# Patient Record
Sex: Male | Born: 1940 | Race: White | Hispanic: No | Marital: Married | Smoking: Former smoker
Health system: Southern US, Community
[De-identification: ages and names within clinical notes are randomized; demographics above are authoritative.]

## PROBLEM LIST (undated history)

## (undated) DIAGNOSIS — R011 Cardiac murmur, unspecified: Secondary | ICD-10-CM

## (undated) DIAGNOSIS — E119 Type 2 diabetes mellitus without complications: Secondary | ICD-10-CM

## (undated) DIAGNOSIS — C61 Malignant neoplasm of prostate: Secondary | ICD-10-CM

## (undated) DIAGNOSIS — K5790 Diverticulosis of intestine, part unspecified, without perforation or abscess without bleeding: Secondary | ICD-10-CM

## (undated) DIAGNOSIS — G4733 Obstructive sleep apnea (adult) (pediatric): Secondary | ICD-10-CM

## (undated) DIAGNOSIS — I480 Paroxysmal atrial fibrillation: Secondary | ICD-10-CM

## (undated) DIAGNOSIS — Z951 Presence of aortocoronary bypass graft: Secondary | ICD-10-CM

## (undated) DIAGNOSIS — I10 Essential (primary) hypertension: Secondary | ICD-10-CM

## (undated) DIAGNOSIS — H3320 Serous retinal detachment, unspecified eye: Secondary | ICD-10-CM

## (undated) DIAGNOSIS — I5032 Chronic diastolic (congestive) heart failure: Secondary | ICD-10-CM

## (undated) DIAGNOSIS — Z953 Presence of xenogenic heart valve: Secondary | ICD-10-CM

## (undated) DIAGNOSIS — I350 Nonrheumatic aortic (valve) stenosis: Secondary | ICD-10-CM

## (undated) DIAGNOSIS — H35039 Hypertensive retinopathy, unspecified eye: Secondary | ICD-10-CM

## (undated) DIAGNOSIS — H33009 Unspecified retinal detachment with retinal break, unspecified eye: Secondary | ICD-10-CM

## (undated) DIAGNOSIS — D509 Iron deficiency anemia, unspecified: Secondary | ICD-10-CM

## (undated) DIAGNOSIS — M1990 Unspecified osteoarthritis, unspecified site: Secondary | ICD-10-CM

## (undated) DIAGNOSIS — I219 Acute myocardial infarction, unspecified: Secondary | ICD-10-CM

## (undated) DIAGNOSIS — I6522 Occlusion and stenosis of left carotid artery: Secondary | ICD-10-CM

## (undated) DIAGNOSIS — Z9989 Dependence on other enabling machines and devices: Secondary | ICD-10-CM

## (undated) DIAGNOSIS — G2581 Restless legs syndrome: Secondary | ICD-10-CM

## (undated) DIAGNOSIS — M6530 Trigger finger, unspecified finger: Secondary | ICD-10-CM

## (undated) DIAGNOSIS — I5041 Acute combined systolic (congestive) and diastolic (congestive) heart failure: Secondary | ICD-10-CM

## (undated) DIAGNOSIS — Z8679 Personal history of other diseases of the circulatory system: Secondary | ICD-10-CM

## (undated) DIAGNOSIS — F0390 Unspecified dementia without behavioral disturbance: Secondary | ICD-10-CM

## (undated) DIAGNOSIS — K219 Gastro-esophageal reflux disease without esophagitis: Secondary | ICD-10-CM

## (undated) DIAGNOSIS — Z9889 Other specified postprocedural states: Secondary | ICD-10-CM

## (undated) DIAGNOSIS — E1110 Type 2 diabetes mellitus with ketoacidosis without coma: Secondary | ICD-10-CM

## (undated) DIAGNOSIS — I2109 ST elevation (STEMI) myocardial infarction involving other coronary artery of anterior wall: Secondary | ICD-10-CM

## (undated) DIAGNOSIS — E782 Mixed hyperlipidemia: Secondary | ICD-10-CM

## (undated) DIAGNOSIS — T300 Burn of unspecified body region, unspecified degree: Secondary | ICD-10-CM

## (undated) DIAGNOSIS — Z9289 Personal history of other medical treatment: Secondary | ICD-10-CM

## (undated) DIAGNOSIS — N529 Male erectile dysfunction, unspecified: Secondary | ICD-10-CM

## (undated) DIAGNOSIS — Z972 Presence of dental prosthetic device (complete) (partial): Secondary | ICD-10-CM

## (undated) DIAGNOSIS — I440 Atrioventricular block, first degree: Secondary | ICD-10-CM

## (undated) DIAGNOSIS — M419 Scoliosis, unspecified: Secondary | ICD-10-CM

## (undated) DIAGNOSIS — J209 Acute bronchitis, unspecified: Secondary | ICD-10-CM

## (undated) HISTORY — DX: Burn of unspecified body region, unspecified degree: T30.0

## (undated) HISTORY — DX: Dependence on other enabling machines and devices: Z99.89

## (undated) HISTORY — DX: Acute bronchitis, unspecified: J20.9

## (undated) HISTORY — DX: Chronic diastolic (congestive) heart failure: I50.32

## (undated) HISTORY — PX: CATARACT EXTRACTION: SUR2

## (undated) HISTORY — DX: Unspecified retinal detachment with retinal break, unspecified eye: H33.009

## (undated) HISTORY — PX: OTHER SURGICAL HISTORY: SHX169

## (undated) HISTORY — DX: Hypertensive retinopathy, unspecified eye: H35.039

## (undated) HISTORY — DX: Obstructive sleep apnea (adult) (pediatric): G47.33

## (undated) HISTORY — DX: Diverticulosis of intestine, part unspecified, without perforation or abscess without bleeding: K57.90

## (undated) HISTORY — DX: ST elevation (STEMI) myocardial infarction involving other coronary artery of anterior wall: I21.09

## (undated) HISTORY — PX: EYE SURGERY: SHX253

## (undated) HISTORY — DX: Acute combined systolic (congestive) and diastolic (congestive) heart failure: I50.41

## (undated) HISTORY — DX: Type 2 diabetes mellitus with ketoacidosis without coma: E11.10

## (undated) HISTORY — DX: Mixed hyperlipidemia: E78.2

## (undated) HISTORY — DX: Serous retinal detachment, unspecified eye: H33.20

## (undated) HISTORY — PX: TRANSTHORACIC ECHOCARDIOGRAM: SHX275

## (undated) HISTORY — PX: SKIN GRAFT: SHX250

## (undated) HISTORY — PX: PROSTATE BIOPSY: SHX241

## (undated) HISTORY — PX: CATARACT EXTRACTION W/ INTRAOCULAR LENS  IMPLANT, BILATERAL: SHX1307

---

## 1986-08-01 HISTORY — PX: APPENDECTOMY: SHX54

## 1999-08-23 ENCOUNTER — Encounter: Admission: RE | Admit: 1999-08-23 | Discharge: 1999-11-21 | Payer: Self-pay | Admitting: Endocrinology

## 2001-05-05 ENCOUNTER — Inpatient Hospital Stay (HOSPITAL_COMMUNITY): Admission: EM | Admit: 2001-05-05 | Discharge: 2001-05-07 | Payer: Self-pay | Admitting: Emergency Medicine

## 2001-05-05 ENCOUNTER — Encounter: Payer: Self-pay | Admitting: Emergency Medicine

## 2001-05-05 HISTORY — PX: OTHER SURGICAL HISTORY: SHX169

## 2001-05-06 ENCOUNTER — Encounter: Payer: Self-pay | Admitting: Orthopedic Surgery

## 2002-09-20 ENCOUNTER — Encounter: Admission: RE | Admit: 2002-09-20 | Discharge: 2002-09-20 | Payer: Self-pay | Admitting: Family Medicine

## 2002-09-20 ENCOUNTER — Encounter: Payer: Self-pay | Admitting: Family Medicine

## 2003-07-03 ENCOUNTER — Encounter: Admission: RE | Admit: 2003-07-03 | Discharge: 2003-07-03 | Payer: Self-pay | Admitting: Internal Medicine

## 2003-07-04 ENCOUNTER — Encounter: Admission: RE | Admit: 2003-07-04 | Discharge: 2003-07-04 | Payer: Self-pay | Admitting: Internal Medicine

## 2004-06-11 ENCOUNTER — Ambulatory Visit: Payer: Self-pay | Admitting: Internal Medicine

## 2005-02-08 ENCOUNTER — Ambulatory Visit (HOSPITAL_BASED_OUTPATIENT_CLINIC_OR_DEPARTMENT_OTHER): Admission: RE | Admit: 2005-02-08 | Discharge: 2005-02-08 | Payer: Self-pay | Admitting: Otolaryngology

## 2005-02-13 ENCOUNTER — Ambulatory Visit: Payer: Self-pay | Admitting: Internal Medicine

## 2005-09-26 ENCOUNTER — Ambulatory Visit: Payer: Self-pay | Admitting: Internal Medicine

## 2005-11-30 ENCOUNTER — Ambulatory Visit: Payer: Self-pay | Admitting: Internal Medicine

## 2006-06-07 ENCOUNTER — Ambulatory Visit: Payer: Self-pay | Admitting: Internal Medicine

## 2006-06-07 LAB — CONVERTED CEMR LAB: PSA: 2.61 ng/mL (ref 0.10–4.00)

## 2006-07-10 ENCOUNTER — Ambulatory Visit: Payer: Self-pay | Admitting: Family Medicine

## 2006-07-13 ENCOUNTER — Encounter: Admission: RE | Admit: 2006-07-13 | Discharge: 2006-07-13 | Payer: Self-pay | Admitting: Family Medicine

## 2006-08-31 DIAGNOSIS — R252 Cramp and spasm: Secondary | ICD-10-CM | POA: Insufficient documentation

## 2006-08-31 DIAGNOSIS — K5730 Diverticulosis of large intestine without perforation or abscess without bleeding: Secondary | ICD-10-CM | POA: Insufficient documentation

## 2006-08-31 DIAGNOSIS — I10 Essential (primary) hypertension: Secondary | ICD-10-CM | POA: Insufficient documentation

## 2006-08-31 DIAGNOSIS — E119 Type 2 diabetes mellitus without complications: Secondary | ICD-10-CM | POA: Insufficient documentation

## 2008-06-10 ENCOUNTER — Ambulatory Visit: Payer: Self-pay | Admitting: Internal Medicine

## 2008-06-10 DIAGNOSIS — M79609 Pain in unspecified limb: Secondary | ICD-10-CM | POA: Insufficient documentation

## 2008-06-10 DIAGNOSIS — J019 Acute sinusitis, unspecified: Secondary | ICD-10-CM | POA: Insufficient documentation

## 2008-06-18 ENCOUNTER — Encounter: Payer: Self-pay | Admitting: Internal Medicine

## 2008-06-24 ENCOUNTER — Ambulatory Visit: Payer: Self-pay | Admitting: Internal Medicine

## 2008-08-05 ENCOUNTER — Ambulatory Visit: Payer: Self-pay | Admitting: Internal Medicine

## 2008-08-05 DIAGNOSIS — J309 Allergic rhinitis, unspecified: Secondary | ICD-10-CM | POA: Insufficient documentation

## 2008-08-05 DIAGNOSIS — G4730 Sleep apnea, unspecified: Secondary | ICD-10-CM | POA: Insufficient documentation

## 2008-08-07 ENCOUNTER — Ambulatory Visit: Payer: Self-pay | Admitting: Cardiovascular Disease

## 2008-08-08 ENCOUNTER — Encounter (INDEPENDENT_AMBULATORY_CARE_PROVIDER_SITE_OTHER): Payer: Self-pay | Admitting: *Deleted

## 2008-08-08 ENCOUNTER — Telehealth: Payer: Self-pay | Admitting: Internal Medicine

## 2008-08-08 DIAGNOSIS — J328 Other chronic sinusitis: Secondary | ICD-10-CM | POA: Insufficient documentation

## 2008-08-08 LAB — CONVERTED CEMR LAB
Basophils Absolute: 0 10*3/uL (ref 0.0–0.1)
Basophils Relative: 0.8 % (ref 0.0–3.0)
Eosinophils Absolute: 0.4 10*3/uL (ref 0.0–0.7)
Eosinophils Relative: 6.8 % — ABNORMAL HIGH (ref 0.0–5.0)
HCT: 39.6 % (ref 39.0–52.0)
Hemoglobin: 13.8 g/dL (ref 13.0–17.0)
Lymphocytes Relative: 33.1 % (ref 12.0–46.0)
MCHC: 34.9 g/dL (ref 30.0–36.0)
MCV: 88.8 fL (ref 78.0–100.0)
Monocytes Absolute: 0.6 10*3/uL (ref 0.1–1.0)
Monocytes Relative: 10.2 % (ref 3.0–12.0)
Neutro Abs: 2.8 10*3/uL (ref 1.4–7.7)
Neutrophils Relative %: 49.1 % (ref 43.0–77.0)
Platelets: 233 10*3/uL (ref 150–400)
RBC: 4.46 M/uL (ref 4.22–5.81)
RDW: 12.7 % (ref 11.5–14.6)
WBC: 5.7 10*3/uL (ref 4.5–10.5)

## 2008-08-21 ENCOUNTER — Encounter: Payer: Self-pay | Admitting: Internal Medicine

## 2008-09-26 ENCOUNTER — Encounter: Payer: Self-pay | Admitting: Internal Medicine

## 2008-10-29 ENCOUNTER — Encounter: Payer: Self-pay | Admitting: Internal Medicine

## 2008-11-12 ENCOUNTER — Encounter: Payer: Self-pay | Admitting: Internal Medicine

## 2008-12-23 ENCOUNTER — Encounter: Payer: Self-pay | Admitting: Internal Medicine

## 2009-01-06 ENCOUNTER — Ambulatory Visit: Payer: Self-pay | Admitting: Internal Medicine

## 2009-01-07 ENCOUNTER — Telehealth (INDEPENDENT_AMBULATORY_CARE_PROVIDER_SITE_OTHER): Payer: Self-pay | Admitting: *Deleted

## 2009-01-07 ENCOUNTER — Encounter (INDEPENDENT_AMBULATORY_CARE_PROVIDER_SITE_OTHER): Payer: Self-pay | Admitting: *Deleted

## 2009-01-13 ENCOUNTER — Telehealth (INDEPENDENT_AMBULATORY_CARE_PROVIDER_SITE_OTHER): Payer: Self-pay | Admitting: *Deleted

## 2009-01-14 ENCOUNTER — Encounter (INDEPENDENT_AMBULATORY_CARE_PROVIDER_SITE_OTHER): Payer: Self-pay | Admitting: Otolaryngology

## 2009-01-14 ENCOUNTER — Ambulatory Visit (HOSPITAL_COMMUNITY): Admission: RE | Admit: 2009-01-14 | Discharge: 2009-01-15 | Payer: Self-pay | Admitting: Otolaryngology

## 2009-01-26 ENCOUNTER — Encounter: Payer: Self-pay | Admitting: Internal Medicine

## 2009-02-27 ENCOUNTER — Encounter: Payer: Self-pay | Admitting: Internal Medicine

## 2009-03-30 ENCOUNTER — Encounter: Payer: Self-pay | Admitting: Internal Medicine

## 2009-05-13 ENCOUNTER — Encounter: Payer: Self-pay | Admitting: Internal Medicine

## 2009-06-16 ENCOUNTER — Encounter: Payer: Self-pay | Admitting: Internal Medicine

## 2009-06-22 ENCOUNTER — Encounter: Payer: Self-pay | Admitting: Internal Medicine

## 2009-07-21 ENCOUNTER — Encounter: Payer: Self-pay | Admitting: Internal Medicine

## 2009-11-13 ENCOUNTER — Emergency Department (HOSPITAL_BASED_OUTPATIENT_CLINIC_OR_DEPARTMENT_OTHER): Admission: EM | Admit: 2009-11-13 | Discharge: 2009-11-13 | Payer: Self-pay | Admitting: Emergency Medicine

## 2010-09-02 NOTE — Consult Note (Signed)
Summary: [facility]  [facility]   Imported By: Lester Cordova 04/16/2009 10:04:13  _____________________________________________________________________  External Attachment:    Type:   Image     Comment:   External Document

## 2010-09-02 NOTE — Consult Note (Signed)
Summary: [facility]   Imported By: Lanelle Bal 07/02/2008 09:21:04  _____________________________________________________________________  External Attachment:    Type:   Image     Comment:   External Document

## 2010-09-02 NOTE — Assessment & Plan Note (Signed)
Summary: acute only for a cold//ph   Vital Signs:  Patient Profile:   70 Years Old Male Weight:      238.2 pounds Temp:     98.2 degrees F oral Pulse rate:   72 / minute BP sitting:   140 / 86  (left arm) Cuff size:   large  Vitals Entered By: Shonna Chock (August 05, 2008 12:18 PM)                 Chief Complaint:  COLD-COUGH and RUNNY NOSE X 3 MONTHS-SEEN X 2 WITH THE SAME CONCERNS.  History of Present Illness: Nasal occlusion despite Neti pot once daily - two times a day . Remains clear for only 5 min post Neti pot.Also on Plain Mucinex. On CPAP nightly.    Current Allergies (reviewed today): No known allergies      Review of Systems  General      Denies chills, fever, and sweats.  ENT      Complains of nasal congestion.      Denies ear discharge, earache, and sinus pressure.      No facial pain , frontal headache ; scant yellow discharge  Resp      Complains of cough.      Denies shortness of breath, sputum productive, and wheezing.  Allergy      Complains of sneezing.      Denies itching eyes and seasonal allergies.   Physical Exam  General:     in no acute distress; alert,appropriate and cooperative throughout examination Eyes:     No corneal or conjunctival inflammation noted. EOMI. Perrla. Vision grossly normal. Ears:     External ear exam shows no significant lesions or deformities.  Otoscopic examination reveals  wax bilat. Hearing is grossly normal bilaterally. Nose:     External nasal examination shows no deformity or inflammation. Nasal mucosa are pink and moist without lesions or exudates. Hyponasal speech Mouth:     Oral mucosa and oropharynx without lesions or exudates.  Teeth in good repair. Lungs:     Normal respiratory effort, chest expands symmetrically. Lungs are clear to auscultation, no crackles or wheezes. Cervical Nodes:     No lymphadenopathy noted Axillary Nodes:     No palpable lymphadenopathy    Impression &  Recommendations:  Problem # 1:  RHINITIS (ICD-477.9)  Orders: Venipuncture ([address]109) TLB-CBC Platelet - w/Differential (85025-CBCD) Radiology Referral (Radiology)  His updated medication list for this problem includes:    Fluticasone Propionate 50 Mcg/act Susp (Fluticasone propionate) .Marland Kitchen... 1 spray two times a day after neti pot  into each nose    Claritin 10 Mg Tabs (Loratadine) .Marland Kitchen... 1 once daily   Problem # 2:  COUGH (ICD-786.2)  Orders: TLB-CBC Platelet - w/Differential (85025-CBCD) Radiology Referral (Radiology)   Problem # 3:  SLEEP APNEA (ICD-780.57)  Orders: Radiology Referral (Radiology)   Complete Medication List: 1)  Hydrochlorothiazide 25 Mg Tabs (Hydrochlorothiazide) .... 1/2 tab qd 2)  Nexium 40 Mg Cpdr (Esomeprazole magnesium) .Marland Kitchen.. 1 by mouth once daily as needed 3)  Requip 1 Mg Tabs (Ropinirole hcl) .Marland Kitchen.. 1 by mouth once daily 4)  Multivitamin  .Marland Kitchen.. 1 by mouth qd 5)  Welchol 625 Mg Tabs (Colesevelam hcl) .... 3 in am 3 in pm 6)  Vytorin 10-80 Mg Tabs (Ezetimibe-simvastatin) .Marland Kitchen.. 1 by mouth qd 7)  Actoplus Met 15-850 Mg Tabs (Pioglitazone hcl-metformin hcl) .Marland Kitchen.. 1 by mouth bid 8)  Lovaza 1 Gm Caps (Omega-3-acid ethyl esters) .... 2in  am 2 in pm 9)  Lantus 100 Unit/ml Soln (Insulin glargine) .... 35 units in am 10)  Lotrel 5-40 Mg Caps (Amlodipine besy-benazepril hcl) .Marland Kitchen.. 1 qd 11)  Nabumetone 750 Mg Tabs (Nabumetone) .Marland Kitchen.. 1 by mouth as needed for back pain 12)  Fluticasone Propionate 50 Mcg/act Susp (Fluticasone propionate) .Marland Kitchen.. 1 spray two times a day after neti pot  into each nose 13)  Claritin 10 Mg Tabs (Loratadine) .Marland Kitchen.. 1 once daily   Patient Instructions: 1)  Singulair 10 mg once daily until cough resolved. Neti pot two times a day followed by nasal spray   Prescriptions: CLARITIN 10 MG TABS (LORATADINE) 1 once daily  #30 x 11   Entered and Authorized by:   Marga Melnick MD   Signed by:   Marga Melnick MD on 08/05/2008   Method used:   Print then  Give to Patient   RxID:   [id number] FLUTICASONE PROPIONATE 50 MCG/ACT SUSP (FLUTICASONE PROPIONATE) 1 spray two times a day after Neti pot  into each nose  #1 x 5   Entered and Authorized by:   Marga Melnick MD   Signed by:   Marga Melnick MD on 08/05/2008   Method used:   Print then Give to Patient   RxID:   [id number]  ]

## 2010-09-02 NOTE — Consult Note (Signed)
Summary: [facility]  [facility]   Imported By: Lanelle Bal 12/31/2008 10:28:15  _____________________________________________________________________  External Attachment:    Type:   Image     Comment:   External Document

## 2010-09-02 NOTE — Progress Notes (Signed)
Summary: hop--ct scan result  Phone Note Call from Patient Call back at Home Phone [phone]   Caller: Patient Summary of Call: Patient is calling about his ct scan result. 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         RxID:   [id number]   Appended Document: hop--ct scan result Already sent all copies and notes. Thanks, Marcelino Duster

## 2010-09-02 NOTE — Consult Note (Signed)
Summary: [facility]  [facility]   Imported By: Lanelle Bal 06/19/2009 10:47:34  _____________________________________________________________________  External Attachment:    Type:   Image     Comment:   External Document

## 2010-09-02 NOTE — Assessment & Plan Note (Signed)
Summary: cold--acute only--tl   Vital Signs:  Patient Profile:   70 Years Old Male Weight:      228 pounds Temp:     97.0 degrees F oral Pulse rate:   70 / minute Resp:     18 per minute BP sitting:   110 / 60  Pt. in pain?   yes    Location:   4th R toe    Intensity:   4 or <    Type:       g  Vitals Entered By: Kandice Hams (June 10, 2008 12:39 PM)                  Chief Complaint:  c/o cough and nasal congestion sinuse.  History of Present Illness: Onset 2 weeks ago as congestion; Rx; saline gavage,Mucinex, Sudafed & antihistamine. Pain in L foot  several years,worse in past few months. Developed "red spot" @ base 3rd & 4th toes  after walking @ beach. Pain worse @ night. Xrays negative of foot 10 years ago for same foot pain. Rx: none. He sees Dr Evlyn Kanner for Diabetes; no Podiatry followup. FBS 130-140; no pc glucoses checked.       Review of Systems  General      Denies chills, fever, and sweats.  Eyes      Denies discharge, eye pain, and red eye.  ENT      Complains of nasal congestion.      Denies earache and sinus pressure.      No facial pain, frontal ha, purulence  Resp      Complains of cough.      Denies shortness of breath, sputum productive, and wheezing.  Derm      Complains of changes in color of skin and lesion(s).      Denies poor wound healing.  Neuro      Denies numbness and tingling.      No burning  Endo      Denies excessive hunger, excessive thirst, and excessive urination.   Physical Exam  General:     in no acute distress; alert,appropriate and cooperative throughout examination Eyes:     No corneal or conjunctival inflammation noted. Perrla.  Ears:     External ear exam shows no significant lesions or deformities.  Otoscopic examination reveals wax bilaterally. Hearing is grossly normal bilaterally. Nose:     External nasal examination shows no deformity or inflammation. R nare boggy Mouth:     Oral mucosa and  oropharynx without lesions or exudates.  Teeth in good repair. pharyngeal erythema. Grey posterior secretions  Lungs:     Normal respiratory effort, chest expands symmetrically. Lungs are clear to auscultation, no crackles or wheezes. Pulses:     R and L dorsalis pedis and posterior tibial pulses are full and equal bilaterally Extremities:     2X2 resolving ecchymosis base R foot. Pain @ 4th toe with foot compression Skin:     see foot Cervical Nodes:     No lymphadenopathy noted Axillary Nodes:     No palpable lymphadenopathy    Impression & Recommendations:  Problem # 1:  SINUSITIS- ACUTE-NOS (ICD-461.9)  His updated medication list for this problem includes:    Amoxicillin-pot Clavulanate 875-125 Mg Tabs (Amoxicillin-pot clavulanate) .Marland Kitchen... 1 q 12 hrs with a meal   Problem # 2:  FOOT PAIN, RIGHT (ICD-729.5) ? neuroma Orders: Podiatry Referral (Podiatry)   Problem # 3:  DIABETES MELLITUS, TYPE II (ICD-250.00)  His  updated medication list for this problem includes:    Actoplus Met 15-850 Mg Tabs (Pioglitazone hcl-metformin hcl) .Marland Kitchen... 1 by mouth bid    Lantus 100 Unit/ml Soln (Insulin glargine) .Marland KitchenMarland KitchenMarland KitchenMarland Kitchen 35 units in am   Complete Medication List: 1)  Hydrochlorothiazide 25 Mg Tabs (Hydrochlorothiazide) .... 1/2 tab qd 2)  Nexium 40 Mg Cpdr (Esomeprazole magnesium) .Marland Kitchen.. 1 by mouth qd 3)  Requip  .Marland Kitchen.. 1 by mouth qd 4)  Multivitamin  .Marland Kitchen.. 1 by mouth qd 5)  Nabumetone 750 Mg Tabs (Nabumetone) .Marland Kitchen.. 1 poo qd 6)  Welchol 625 Mg Tabs (Colesevelam hcl) .... 3 in am 3 in pm 7)  Vytorin 10-80 Mg Tabs (Ezetimibe-simvastatin) .Marland Kitchen.. 1 by mouth qd 8)  Actoplus Met 15-850 Mg Tabs (Pioglitazone hcl-metformin hcl) .Marland Kitchen.. 1 by mouth bid 9)  Lovaza 1 Gm Caps (Omega-3-acid ethyl esters) .... 2in am 2 in pm 10)  Lotrel  .Marland Kitchen.. 1 by mouth once daily 11)  Lantus 100 Unit/ml Soln (Insulin glargine) .... 35 units in am 12)  Amoxicillin-pot Clavulanate 875-125 Mg Tabs (Amoxicillin-pot clavulanate) .Marland Kitchen.. 1  q 12 hrs with a meal   Patient Instructions: 1)  Drink as much fluid as you can tolerate for the next few days. Neti pot once daily for any congestion. Stop Sudafed.   Prescriptions: AMOXICILLIN-POT CLAVULANATE 875-125 MG TABS (AMOXICILLIN-POT CLAVULANATE) 1 q 12 hrs WITH A MEAL  #20 x 0   Entered and Authorized by:   Marga Melnick MD   Signed by:   Marga Melnick MD on 06/10/2008   Method used:   Print then Give to Patient   RxID:   [id number]  ]

## 2010-09-02 NOTE — Letter (Signed)
Summary: [facility]  [facility]   Imported By: Lanelle Bal 07/06/2009 11:00:50  _____________________________________________________________________  External Attachment:    Type:   Image     Comment:   External Document

## 2010-09-02 NOTE — Consult Note (Signed)
Summary: [facility]  [facility]   Imported By: Lanelle Bal 05/20/2009 12:31:03  _____________________________________________________________________  External Attachment:    Type:   Image     Comment:   External Document

## 2010-09-02 NOTE — Progress Notes (Signed)
Summary: cough no better  Phone Note Call from Patient Call back at Home Phone [phone]   Complaint: Chest Pain Summary of Call: pt left message that he was given PROMETHAZINE-CODEINE on 01-06-09 pt still has some of this med left. pt states that he still has a cough and would like to know if he can get something called in for it..pt uses [facility] [address]...............Marland KitchenFelecia Deloach CMA  January 13, 2009 12:45 PM  Follow-up for Phone Call        Tussionex 1 tsp q 8-12 hrs 90cc Follow-up by: Marga Melnick MD,  January 13, 2009 1:46 PM  Additional Follow-up for Phone Call Additional follow up Details #1::        pt aware rx sent to pharmacy...........Marland KitchenFelecia Deloach CMA  January 13, 2009 3:31 PM    New/Updated Medications: Sandria Senter ER 8-10 MG/5ML LQCR (CHLORPHENIRAMINE-HYDROCODONE) Take 1 tsp q 8-12 hrs   Prescriptions: TUSSIONEX PENNKINETIC ER 8-10 MG/5ML LQCR (CHLORPHENIRAMINE-HYDROCODONE) Take 1 tsp q 8-12 hrs  #90 x 0   Entered by:   Jeremy Johann CMA   Authorized by:   Marga Melnick MD   Signed by:   Jeremy Johann CMA on 01/13/2009   Method used:   Printed then faxed to ...       [facility]. [phone]* (retail)       [address]       [place], [place]  [address]       Ph: [phone]       Fax: [phone]   RxID:   [id number]

## 2010-09-02 NOTE — Consult Note (Signed)
Summary: [facility]  [facility]   Imported By: Lanelle Bal 03/05/2009 10:16:59  _____________________________________________________________________  External Attachment:    Type:   Image     Comment:   External Document

## 2010-09-02 NOTE — Consult Note (Signed)
Summary: [facility]  [facility]   Imported By: Lanelle Bal 04/03/2009 11:40:55  _____________________________________________________________________  External Attachment:    Type:   Image     Comment:   External Document

## 2010-09-02 NOTE — Consult Note (Signed)
Summary: [facility]  [facility]   Imported By: Lanelle Bal 10/08/2008 12:38:08  _____________________________________________________________________  External Attachment:    Type:   Image     Comment:   External Document

## 2010-09-02 NOTE — Consult Note (Signed)
Summary: [facility]  [facility]   Imported By: Lanelle Bal 11/18/2008 11:32:58  _____________________________________________________________________  External Attachment:    Type:   Image     Comment:   External Document

## 2010-09-02 NOTE — Assessment & Plan Note (Signed)
Summary: bad cough//vgj   Vital Signs:  Patient Profile:   70 Years Old Male Weight:      230 pounds Temp:     98.3 degrees F oral Pulse rate:   76 / minute Resp:     17 per minute BP sitting:   160 / 90  (left arm) Cuff size:   large  Vitals Entered By: Shonna Chock (June 24, 2008 1:36 PM)                 Chief Complaint:  Cough X 4-5 DAYS and RUNNY NOSE X 1 MONTH.  History of Present Illness: NP cough ; Rx: Delsym of benefit. No extrinsic symptoms; minor PNDrainage.FBS was 136 this am. Not checking BP.    Current Allergies (reviewed today): No known allergies     Risk Factors:  Tobacco use:  never Alcohol use:  yes    Type:  BEER Exercise:  no   Review of Systems  General      Denies chills, fever, and sweats.  ENT      Denies earache and sinus pressure.      No facial pain, frontal headache or purulence  Resp      Complains of cough.      Denies shortness of breath and wheezing.   Physical Exam  General:     in no acute distress; alert,appropriate and cooperative throughout examination Ears:     External ear exam shows no significant lesions or deformities.  Otoscopic examination reveals wax bilat. Hearing is grossly normal bilaterally. Nose:     External nasal examination shows no deformity or inflammation. Nasal mucosa aremildly erythematous  without lesions or exudates. Mouth:     Oral mucosa and oropharynx without lesions or exudates.  Teeth in good repair. Lungs:     Normal respiratory effort, chest expands symmetrically. Lungs are clear to auscultation, no crackles or wheezes. Cervical Nodes:     No lymphadenopathy noted Axillary Nodes:     No palpable lymphadenopathy    Impression & Recommendations:  Problem # 1:  BRONCHITIS-ACUTE (ICD-466.0)  The following medications were removed from the medication list:    Amoxicillin-pot Clavulanate 875-125 Mg Tabs (Amoxicillin-pot clavulanate) .Marland Kitchen... 1 q 12 hrs with a meal  His  updated medication list for this problem includes:    Promethazine-codeine 6.25-10 Mg/41ml Syrp (Promethazine-codeine) .Marland Kitchen... 1 tsp q 6 hrs as needed    Doxycycline Hyclate 100 Mg Caps (Doxycycline hyclate) .Marland Kitchen... 1 two times a day x 2 days then 1 once daily   Complete Medication List: 1)  Hydrochlorothiazide 25 Mg Tabs (Hydrochlorothiazide) .... 1/2 tab qd 2)  Nexium 40 Mg Cpdr (Esomeprazole magnesium) .Marland Kitchen.. 1 by mouth once daily as needed 3)  Requip 1 Mg Tabs (Ropinirole hcl) .Marland Kitchen.. 1 by mouth once daily 4)  Multivitamin  .Marland Kitchen.. 1 by mouth qd 5)  Welchol 625 Mg Tabs (Colesevelam hcl) .... 3 in am 3 in pm 6)  Vytorin 10-80 Mg Tabs (Ezetimibe-simvastatin) .Marland Kitchen.. 1 by mouth qd 7)  Actoplus Met 15-850 Mg Tabs (Pioglitazone hcl-metformin hcl) .Marland Kitchen.. 1 by mouth bid 8)  Lovaza 1 Gm Caps (Omega-3-acid ethyl esters) .... 2in am 2 in pm 9)  Lotrel  .Marland Kitchen.. 1 by mouth once daily 10)  Lantus 100 Unit/ml Soln (Insulin glargine) .... 35 units in am 11)  Promethazine-codeine 6.25-10 Mg/41ml Syrp (Promethazine-codeine) .Marland Kitchen.. 1 tsp q 6 hrs as needed 12)  Doxycycline Hyclate 100 Mg Caps (Doxycycline hyclate) .Marland Kitchen.. 1 two times a day  x 2 days then 1 once daily 13)  Lotrel 5-40 Mg Caps (Amlodipine besy-benazepril hcl) .Marland Kitchen.. 1 qd   Patient Instructions: 1)  Limit your Sodium (Salt) to less than 4 grams a day (slightly less than 1 teaspoon) to prevent fluid retention, swelling, or worsening or symptoms. Avoid decongestants. 2)  Check your Blood Pressure regularly. If it is above: 130/85 ON AVERAGE  you should make an appointment.   Prescriptions: LOTREL 5-40 MG CAPS (AMLODIPINE BESY-BENAZEPRIL HCL) 1 qd  #30 x 5   Entered and Authorized by:   Marga Melnick MD   Signed by:   Marga Melnick MD on 06/24/2008   Method used:   Print then Give to Patient   RxID:   [id number] DOXYCYCLINE HYCLATE 100 MG CAPS (DOXYCYCLINE HYCLATE) 1 two times a day X 2 days then 1 once daily  #12 x 0   Entered and Authorized by:   Marga Melnick MD   Signed by:   Marga Melnick MD on 06/24/2008   Method used:   Print then Give to Patient   RxID:   [id number] PROMETHAZINE-CODEINE 6.25-10 MG/5ML SYRP (PROMETHAZINE-CODEINE) 1 tsp q 6 hrs as needed  #120cc x 0   Entered and Authorized by:   Marga Melnick MD   Signed by:   Marga Melnick MD on 06/24/2008   Method used:   Print then Give to Patient   RxID:   [id number]  ]

## 2010-09-02 NOTE — Consult Note (Signed)
Summary: [facility]  [facility]   Imported By: Lanelle Bal 07/31/2009 10:18:28  _____________________________________________________________________  External Attachment:    Type:   Image     Comment:   External Document

## 2010-09-02 NOTE — Assessment & Plan Note (Signed)
Summary: congested/cough/cbs   Vital Signs:  Patient profile:   70 year old male Weight:      227.38 pounds Temp:     98.2 degrees F oral Resp:     17 per minute BP sitting:   140 / 72  Vitals Entered By: Shonna Chock (January 06, 2009 12:37 PM) CC: C/O NASAL STUFFINESS,COUGH, scheduled for sinus surgery 01/14/09 finished round of avelox 400mg  x 14 days, also prednisone taper dose   CC:  C/O NASAL STUFFINESS, COUGH, scheduled for sinus surgery 01/14/09 finished round of avelox 400mg  x 14 days, and also prednisone taper dose.  History of Present Illness: Ongoing congestion  despite 14 days of Avelox ,completed 01/05/09 (yesterday). Dr Jenne Pane'  12/23/08 note reviewed; endoscopic surgery planned for 6/16.In [place] 5/14-24. Now having chest congestion which is concern in relationship to  pending surgery . No PMH of asthma; PMH of sleep apnea  Allergies: No Known Drug Allergies  Past History:  Past Medical History: Diabetes mellitus, type II Diverticulosis, colon Hypertension Sleep Apnea, Dr Dohmier  Review of Systems General:  Denies chills, fever, and sweats. Eyes:  Denies discharge, eye pain, and red eye. ENT:  Complains of nasal congestion and sinus pressure; No frontal headache or facial pain; purulent secretions. Resp:  Complains of cough and sputum productive; denies chest pain with inspiration, coughing up blood, pleuritic, shortness of breath, and wheezing; Phlegm is slightly yellow. GI:  Complains of diarrhea; Intermittent diarrhea.  Physical Exam  General:  in no acute distress; alert,appropriate and cooperative throughout examination Ears:  Wax bilaterally Nose:  External nasal examination shows no deformity or inflammation. R nare erythematous with crusting Mouth:  Oral mucosa and oropharynx without lesions or exudates. Mild pharyngeal erythema.   Lungs:  Normal respiratory effort, chest expands symmetrically. Lungs : mild  crackles LLL > RLL Cervical Nodes:  No  lymphadenopathy noted Axillary Nodes:  No palpable lymphadenopathy   Impression & Recommendations:  Problem # 1:  BRONCHITIS-ACUTE (ICD-466.0)  The following medications were removed from the medication list:    Metronidazole 500 Mg Tabs (Metronidazole) .Marland Kitchen... 1 tid His updated medication list for this problem includes:    Doxycycline Hyclate 100 Mg Caps (Doxycycline hyclate) .Marland Kitchen... 1 two times a day (avoid direct sun)    Promethazine-codeine 6.25-10 Mg/80ml Syrp (Promethazine-codeine) .Marland Kitchen... 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 1 spray two times a day after neti pot  into each nose    Metronidazole 500 Mg Tabs (Metronidazole) .Marland Kitchen... 1 tid His updated medication list for this problem includes:    Doxycycline Hyclate 100 Mg Caps (Doxycycline hyclate) .Marland Kitchen... 1 two times a day (avoid direct sun)    Promethazine-codeine 6.25-10 Mg/38ml Syrp (Promethazine-codeine) .Marland Kitchen... 1 tsp q 6 hrs as needed  Complete Medication List: 1)  Hydrochlorothiazide 25 Mg Tabs (Hydrochlorothiazide) .... 1/2 tab qd 2)  Nexium 40 Mg Cpdr (Esomeprazole magnesium) .Marland Kitchen.. 1 by mouth once daily as needed 3)  Requip 1 Mg Tabs (Ropinirole hcl) .Marland Kitchen.. 1 by mouth once daily 4)  Multivitamin  .Marland Kitchen.. 1 by mouth qd 5)  Welchol 625 Mg Tabs (Colesevelam hcl) .... 3 in am 3 in pm 6)  Vytorin 10-80 Mg Tabs (Ezetimibe-simvastatin) .Marland Kitchen.. 1 by mouth qd 7)  Actoplus Met 15-850 Mg Tabs (Pioglitazone hcl-metformin hcl) .Marland KitchenMarland KitchenMarland Kitchen  1 by mouth bid 8)  Lovaza 1 Gm Caps (Omega-3-acid ethyl esters) .... 2in am 2 in pm 9)  Lantus 100 Unit/ml Soln (Insulin glargine) .... 35 units in am 10)  Lotrel 5-40 Mg Caps (Amlodipine besy-benazepril hcl) .Marland Kitchen.. 1 qd 11)  Nabumetone 750 Mg Tabs (Nabumetone) .Marland Kitchen.. 1 by mouth as needed for back pain 12)  Doxycycline  Hyclate 100 Mg Caps (Doxycycline hyclate) .Marland Kitchen.. 1 two times a day (avoid direct sun) 13)  Promethazine-codeine 6.25-10 Mg/82ml Syrp (Promethazine-codeine) .Marland Kitchen.. 1 tsp q 6 hrs as needed  Patient Instructions: 1)  Align once daily as needed for any loose or watery stool. Prescriptions: PROMETHAZINE-CODEINE 6.25-10 MG/5ML SYRP (PROMETHAZINE-CODEINE) 1 tsp q 6 hrs as needed  #120cc x 0   Entered and Authorized by:   Marga Melnick MD   Signed by:   Marga Melnick MD on 01/06/2009   Method used:   Print then Give to Patient   RxID:   [id number] DOXYCYCLINE HYCLATE 100 MG CAPS (DOXYCYCLINE HYCLATE) 1 two times a day (avoid direct sun)  #20 x 0   Entered and Authorized by:   Marga Melnick MD   Signed by:   Marga Melnick MD on 01/06/2009   Method used:   Print then Give to Patient   RxID:   [id number]

## 2010-09-02 NOTE — Letter (Signed)
Summary: Results Follow up Letter  [name]  [address] [place], [place] [address]   Phone: [phone]  Fax: [phone]    01/07/2009 MRN: [id number]  [facility] [address] [place], [place]  [address]  Dear [name],  The following are the results of your recent test(s):  Test         Result    Pap Smear:        Normal _____  Not Normal _____ Comments: ______________________________________________________ Cholesterol: LDL(Bad cholesterol):         Your goal is less than:         HDL (Good cholesterol):       Your goal is more than: Comments:  ______________________________________________________ Mammogram:        Normal _____  Not Normal _____ Comments:  ___________________________________________________________________ Hemoccult:        Normal _____  Not normal _______ Comments:    _____________________________________________________________________ Other Tests: Please see attached Chest X-Ray done on 01/06/2009   We routinely do not discuss normal results over the telephone.  If you desire a copy of the results, or you have any questions about this information we can discuss them at your next office visit.   Sincerely,

## 2010-11-08 LAB — CBC
HCT: 40.4 % (ref 39.0–52.0)
Hemoglobin: 13.7 g/dL (ref 13.0–17.0)
MCHC: 33.9 g/dL (ref 30.0–36.0)
MCV: 89.7 fL (ref 78.0–100.0)
Platelets: 190 10*3/uL (ref 150–400)
RBC: 4.51 MIL/uL (ref 4.22–5.81)
RDW: 13.2 % (ref 11.5–15.5)
WBC: 6.6 10*3/uL (ref 4.0–10.5)

## 2010-11-08 LAB — GLUCOSE, CAPILLARY
Glucose-Capillary: 185 mg/dL — ABNORMAL HIGH (ref 70–99)
Glucose-Capillary: 208 mg/dL — ABNORMAL HIGH (ref 70–99)
Glucose-Capillary: 226 mg/dL — ABNORMAL HIGH (ref 70–99)
Glucose-Capillary: 229 mg/dL — ABNORMAL HIGH (ref 70–99)
Glucose-Capillary: 235 mg/dL — ABNORMAL HIGH (ref 70–99)
Glucose-Capillary: 268 mg/dL — ABNORMAL HIGH (ref 70–99)

## 2010-11-08 LAB — COMPREHENSIVE METABOLIC PANEL
ALT: 28 U/L (ref 0–53)
AST: 20 U/L (ref 0–37)
Albumin: 3.3 g/dL — ABNORMAL LOW (ref 3.5–5.2)
Alkaline Phosphatase: 40 U/L (ref 39–117)
BUN: 11 mg/dL (ref 6–23)
CO2: 26 mEq/L (ref 19–32)
Calcium: 9.3 mg/dL (ref 8.4–10.5)
Chloride: 105 mEq/L (ref 96–112)
Creatinine, Ser: 0.87 mg/dL (ref 0.4–1.5)
GFR calc Af Amer: >60 mL/min (ref >60)
GFR calc non Af Amer: >60 mL/min (ref >60)
Glucose, Bld: 180 mg/dL — ABNORMAL HIGH (ref 70–99)
Potassium: 4.2 mEq/L (ref 3.5–5.1)
Sodium: 138 mEq/L (ref 135–145)
Total Bilirubin: 0.6 mg/dL (ref 0.3–1.2)
Total Protein: 5.8 g/dL — ABNORMAL LOW (ref 6.0–8.3)

## 2010-12-14 NOTE — Op Note (Signed)
NAMECORDARRYL, MONRREAL NO.:  [id number]   MEDICAL RECORD NO.:  [id number]          PATIENT TYPE:  OIB   LOCATION:  5151                         FACILITY:  [facility]   PHYSICIAN:  Antony Contras, MD     DATE OF BIRTH:  [birth year]   DATE OF PROCEDURE:  01/14/2009  DATE OF DISCHARGE:                               OPERATIVE REPORT   PREOPERATIVE DIAGNOSES:  1. Chronic polypoid pansinusitis.  2. Inferior turbinate hypertrophy.   POSTOPERATIVE DIAGNOSES:  1. Chronic polypoid pansinusitis.  2. Inferior turbinate hypertrophy.   PROCEDURE:  1. Bilateral total ethmoidectomy.  2. Bilateral frontal recess exploration.  3. Bilateral maxillary antrostomy with tissue removal.  4. Bilateral sphenoidotomy.  5. Stealth image guidance.  6. Bilateral inferior turbinate reduction.   SURGEON:  Antony Contras, MD   ANESTHESIA:  General endotracheal anesthesia.   COMPLICATIONS:  None.   INDICATIONS:  The patient is a 70 year old white male who has a several  month history of nasal obstruction and postnasal drainage and thick  nasal discharge that has been treated multiple times with antibiotics  and steroids with some benefit but only for temporary time frame.  Imaging demonstrates polypoid changes within the ethmoid sinuses and air-  fluid levels and swelling involving all of the sinuses otherwise.  In  addition, he was found to have enlarged turbinates and presents to the  operating room for surgical management.   FINDINGS:  1. The turbinates were found to be enlarged.  2. There was polypoid tissue filling the middle meatus and superior      meatus on both sides.  Polypoid tissue extended into the ethmoid      sinuses and obstructed the frontal recesses and sphenoid openings,      as well as maxillary sinus openings.  The right maxillary sinus had      polypoid tissue just about filling the sinus and there is mild      polypoid tissue with pus in the left maxillary sinus.   The left      middle turbinate was largely destroyed by polypoid tissue and was      trimmed back during the procedure.  Toward the end of the      procedure, a leak of clear fluid was seen at the anterior skull      base in the right ethmoid region and was treated with DuraSeal      without any additional leak.   DESCRIPTION OF PROCEDURE:  The patient was identified in the holding  room and informed consent having been obtained including discussion of  risks, benefits, and alternatives, the patient was brought to the  operative suite and put on the operating table in supine position.  Anesthesia was induced and the patient was intubated by the Anesthesia  team without difficulty.  The patient was given intravenous antibiotics  and steroids during the case.  The eyes were lubricated and the bed was  turned 90 degrees from Anesthesia.  The stealth head frame system was  placed in the standard fashion and the patient was  then registered to  the system.  The face was prepped and draped in sterile fashion.  Afrin  pledgets were added to the nasal passages on both sides.  The eyes were  taped closed with Steri-Strips.  The instruments were then calibrated to  the system.  The right side pledgets were removed and lateral nasal wall  was injected with 1% lidocaine with 1:100,000 epinephrine in standard  fashion.  Polypoid tissue was then biopsied using forceps and removed  using the microdebrider, exposing the middle turbinate and uncinate  process.  The uncinate process was then divided using a backbiter and  removed with the microdebrider.  The maxillary opening was then seen  with an angled telescope and was widened using curve suction and  microdebrider.  A curved microdebrider was then inserted into the  maxillary sinus and used to remove polypoid tissue.  The anterior tissue  was removed by passing the microdebrider through the inferior meatus  into the sinus.  After this, a 0-degree  telescope was again used to  evaluate the ethmoid and the bulla was taken down with the  microdebrider, followed by anterior ethmoid cells.  Dissection continued  following polypoid tissue into the posterior ethmoid and septations and  polypoid tissue were then removed back to the skull base posteriorly  within the ethmoid region.  Further dissection removed tissue from the  sphenoid rostrum exposing it.  The middle turbinate was then lateralized  and polypoid tissue then removed from the superior meatus.  The superior  turbinate was then partially trimmed with the microdebrider exposing the  sphenoid opening which was then widened using microdebrider with  polypoid tissue removed from the opening.  The opening was widened  laterally and inferiorly primarily.  After this, the middle turbinate  was medialized again and the skull base was then examined in retrograde  fashion removing polypoid tissue and septations from the skull base to  the frontal recess.  The frontal recess was easily opened using  microdebrider with wide patency of the frontal recess.  Thick mucus was  suctioned from the frontal sinus opening.  At this point, Afrin pledgets  were added to the right ethmoid cavity and the left side was examined.  The same procedures were carried on the left side including biopsy of  the polyp, removal of polyp tissue to the middle meatus, widening of the  maxillary sinus with removal of tissue from the sinus using curved  microdebrider, removal of septations and polypoid tissue through the  ethmoid sinuses back to the skull base at the sphenoid rostrum, removal  of polypoid tissue from the superior meatus and exposure of the sphenoid  opening, widening of the sphenoid opening using microdebrider in a  lateral and inferior direction, and retrograde removal of the ethmoid  septations and polypoid tissue from the anterior ethmoid and frontal  recess.  Once again, on the left side, the  frontal recess was widely  patent after removing the anterior ethmoid tissues.  Afrin pledgets were  added to the left side.  At this point, the inferior turbinates were  injected with 1% lidocaine with 1:100,000 epinephrine on both sides.  A  stab incision was made at the anterior head of the turbinate on both  sides using a 15-blade scalpel and soft tissues were elevated off the  underlying bone using Therapist, nutritional.  The turbinate blade of the  microdebrider was then used to remove submucosal tissues on both sides  and the bones were  then lateralized on both sides.  The nose was then  further suctioned and reexamined with the telescope.  The right side was  suctioned of blood and debris and there was little bit of the loose bone  fragments in the anterior ethmoid skull base.  Upon gentle manipulation  of this area, clear fluid was then seen to drain from the soft tissues  of this part of the skull base.  Thus, a pledget was placed over the  area and the patient was then hyperventilated down to a CO2 of about 20.  DuraSeal was then assembled and mixed and then the pledget was removed.  The DuraSeal was then added to the anterior skull base region where the  leak was occurring and no further leak was seen.  The nose was then  further suctioned and half of a nasal port pack was then placed in each  side and the ethmoid cavity coated in Bactroban ointment.  The nose and  throat were then further  suctioned.  The patient was then returned to Anesthesia for wake up.  The antenna device was removed and the eyes were untaped and found to be  in good condition.  The patient was then extubated and taken to recovery  room in stable condition.      Antony Contras, MD  Electronically Signed     Antony Contras, MD  Electronically Signed    DDB/MEDQ  D:  01/14/2009  T:  01/15/2009  Job:  [id number]

## 2010-12-17 NOTE — Op Note (Signed)
[facility]  Patient:    Cody Phelps, Cody Phelps Visit Number: [id number] MRN: [id number]          Service Type: SUR Location: 5000 [id number] Attending Physician:  Alinda Deem Dictated by:   Alinda Deem, M.D. Admit Date:  05/05/2001                             Operative Report  PREOPERATIVE DIAGNOSIS:  Right ankle bimalleolar fracture with a pilon component.  POSTOPERATIVE DIAGNOSIS:  Right ankle pilon fracture.  PROCEDURE:  Open reduction and internal fixation of right ankle bimalleolar or pilon fracture.  SURGEON:  Alinda Deem, M.D.  FIRST ASSISTANT:  Dorthula Matas, P.A.-C.  ANESTHESIA:  General endotracheal.  ESTIMATED BLOOD LOSS:  Minimal.  FLUID REPLACEMENT:  1 L crystalloid.  DRAINS:  None.  TOURNIQUET TIME:  One hour.  INDICATION FOR PROCEDURE:  A 70 year old man who has oral-controlled diabetes and hypertension, who stepped in a chuck hole and sustained a comminuted bimalleolar ankle fracture that went into the superior weightbearing portion of the tibia and therefore was a pilon fracture at surgery.  This was actually a fracture-dislocation, and the dislocation was reduced in the emergency room to preserve skin function, and this was accomplished within five or 10 minutes when I met him in the ER.  In any event, he was taken for open reduction and internal fixation of the unstable ankle fracture.  DESCRIPTION OF PROCEDURE:  The patient identified by arm band and taken to the operating room at [facility] main hospital.  Appropriate anesthetic monitors were attached.  General endotracheal anesthesia induced, and a right calf tourniquet applied.  The right lower extremity was then prepped and draped in the usual sterile fashion from the toes to the tourniquet.  We began the procedure by making a fairly long medial incision that started out 2 cm below the tip of the medial malleolus and went proximally for about 6-7  cm.  We cut through the skin and then used tenotomy scissors to dissect the subcutaneous tissue, where we found the medial malleolus and the superior aspect of the tibial plafond fractured off with two major components, with some minor components as well.  The superior plafond, which wrapped around medially above the medial malleolus, was reduced and fixed with two mini-fragment screws. This allowed [place] to see the medial malleolus into the tibia and the plafond fragment that had previously been fixed and fixed it with two 4 mm fully-threaded cancellous lag screws.  Once we accomplished this, the medial side was reconstructed and the ankle was relatively stable since the deltoid ligament was intact.  We then directed our attention laterally, where we made an incision in the tip of the lateral malleolus proximally over a distance of about 15 cm because of the long, spiral comminuted butterfly fracture of the fibula.  Again using tenotomy scissors, we dissected through the soft tissue down to the fibula and using a periosteal elevator, stripped the lateral surface of the fibula to allow reduction of the fragments and fixation of the butterfly using two anterior to posterior 3.5 mm cortical lag screws from the AO small fragment set and then a third anterior to posterior lag screw to fix the butterfly to the distal fragment.  We then selected a 10-hole neutralization plate, a [id number]-[id number], one-third tubular plate, for neutralization, and distally used one bicortical screw, one syndesmosis screw, and two cancellous  screws, and proximally four bicortical screws.  The syndesmosis screw was actually a 4.5 mm screw from the AO standard fragment set to further support the mortise joint.  Anatomic reduction was confirmed by radiographs, and the wounds were then washed out with normal saline solution.  The tourniquet, which had been inflated to 300 mmHg at the beginning of the case, was let down at this  point.  Small bleeders were identified and cauterized. The subcutaneous tissue was closed with running 3-0 Vicryl suture and the skin with running interlocking 3-0 nylon suture.  We then applied a dressing of Xeroform, 4 x 4 dressing sponges, Webril, and an Ace wrap.  A Cam walker boot was applied, and the patient was awakened and taken to the recovery room without difficulty. Dictated by:   Alinda Deem, M.D. Attending Physician:  Alinda Deem DD:  05/06/01 TD:  05/07/01 Job: [id number]

## 2010-12-17 NOTE — Procedures (Signed)
NAME:  Cody, Phelps NO.:  [id number]   MEDICAL RECORD NO.:  [id number]          PATIENT TYPE:  OUT   LOCATION:  SLEEP CENTER                 FACILITY:  [facility]   PHYSICIAN:  Clinton D. Maple Hudson, M.D. DATE OF BIRTH:  Apr 05, 1941   DATE OF STUDY:  02/08/2005                              NOCTURNAL POLYSOMNOGRAM   REFERRING PHYSICIAN:  Onalee Hua L. Annalee Genta, M.D.   INDICATION FOR STUDY:  Hypersomnia with sleep apnea. Epworth sleep score:  14/24. BMI: 32. Weight 220 pounds.   SLEEP ARCHITECTURE:  Total sleep time 355 minutes with sleep efficiency 82%.  Stage I was 7%, stage II 86%, stages III and IV were absent. REM 6% of total  sleep time. Sleep latency 13.5 minutes. REM latency 19 minutes. Awake after  sleep onset 68 minutes. Arousal index increased at 66. Bed time medication  not recorded.   RESPIRATORY DATA:  Split study protocol. Respiratory disturbance index  (RDI/AHI) 110.3 obstructive events per hour indicating severe obstructive  sleep apnea/hypopnea syndrome before C-PAP. This included 3 obstructive  sleep apneas and 237 hypopneas before C-PAP. Events were not positional. REM  RDI 0. C-PAP was titrated 12 CWP, RDI 2 per hour. Using a large comfort gel  mask with heated humidifier.   OXYGEN DATA:  Moderate snoring with oxygen desaturation to a nadir of 96% on  review of graph. Mean oxygen saturation on C-PAP was 96% on room air.   CARDIAC DATA:  Normal sinus rhythm.   MOVEMENT/PARASOMNIA:  A total of 415 limb jerks were reported of which 60  were associated with arousal or awakening for a periodic limb movement with  arousal index of 10 per hour which is increased.   IMPRESSION/RECOMMENDATIONS:  1.  Severe obstructive sleep apnea/hypopnea syndrome, respiratory      disturbance index 110.3 per hour with moderate snoring and oxygen      desaturation to 96%.  2.  Consider return for C-PAP titration or evaluate for alternative      therapies as appropriate.  3.   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 This may improve      after treatment for sleep apnea when sleep is less disturbed.     Clinton D. Maple Hudson, M.D.  Diplomat   CDY/MEDQ  D:  02/13/2005 14:52:29  T:  02/14/2005 02:05:37  Job:  [id number]

## 2010-12-17 NOTE — H&P (Signed)
[place]. [facility]  Patient:    Cody Phelps, Cody Phelps Visit Number: [id number] MRN: [id number]          Service Type: SUR Location: [phone] Attending Physician:  Alinda Deem Dictated by:   Alinda Deem, M.D. Admit Date:  05/05/2001                           History and Physical  CHIEF COMPLAINT:  Bimalleolar right ankle fracture.  HISTORY OF PRESENT ILLNESS:  [age]-year-old oral-controlled diabetic who stepped in a chuckhole approximately 1915 hours on May 05, 2001, and sustained a closed comminuted right ankle bimalleolar fracture.  He was transported to [facility] Emergency Room.  X-rays were obtained.  Orthopedic consultation was obtained.  I performed a closed reduction in the emergency room, much to the satisfaction of the patient, who was not getting much relief from 12 mg of IV morphine.  He was neurovascularly intact both before and after the closed reduction.  Plain radiographs revealed a comminuted fracture of the medial and lateral malleolus, and he is going to be taken for open reduction and internal fixation of same.  No prior problems with the ankle.  PAST MEDICAL HISTORY: 1. History of sleep apnea and uses CPAP. 2. Oral-controlled diabetic, on Avandia and Glucophage, unsure of the dosages. 3. Hypertension, for which he takes Toprol and Altace, unsure of the dosages. 4. He has some reflux and takes Prilosec, and he is also unsure of that    dosage.  His wife is going to get the medications.  SOCIAL HISTORY:  He does not smoke, and he does not drink.  PAST SURGICAL HISTORY:  Appendectomy some years ago.  No problems with the anesthesia.  No other surgeries.  FAMILY HISTORY:  No history of heart disease.  Although his parents lived to be in their 6s, his father died of heart disease.  His mother died of natural causes.  SOCIAL HISTORY:  He lives with his wife and daughter here in [place].  They live in a one-level  house, two steps leading into the house.  He is employed as a Barista at [employer]  REVIEW OF SYSTEMS:  He has cataracts and was going to have cataract surgery in a few days but, obviously, this has been cancelled.  No history of problems with his ears.  He has his own teeth.  No shortness of breath or chest pain. No problems with heart, lungs, liver, spleen, kidneys, or any other internal organs.  No history of kidney stones or bladder infections.  No bleeding per rectum.  No problem with any other joints.  No back problems.  No neck problems.  PHYSICAL EXAMINATION:  GENERAL:  Well-nourished, well-developed 70 year old man.  VITAL SIGNS:  About 230 pounds, 5 feet 10 inches tall, with an obvious fracture dislocation of his right ankle.  VITAL SIGNS:  Temperature 98.1, pulse 90, blood pressure 138/80, respiratory rate 16.  LUNGS:  Clear.  HEART:  Regular rate and rhythm.  ABDOMEN:  Soft and nontender.  BACK:  Clinically straight.  Nontender.  EXTREMITIES:  Full range of motion of shoulders, elbows, wrists, fingers, both hips, contralateral knee and ankle.  The right ankle is obviously deformed, and he did not want to move his knee.  After discussion options with him in the emergency room, I went ahead and performed closed reduction of the ankle without too much difficulty.  He remained neurovascularly intact.  LABORATORY DATA:  Plain radiographs showed a comminuted bimalleolar fracture dislocation of the right ankle.  ASSESSMENT:  Bimalleolar fracture right ankle in a 70 year old diabetic who had a burrito about 1-1/2 hours before the fracture, with the fracture occurring around 1915 hours.  PLAN:  Because of the tenting of the skin and the swelling involved, emergent open reduction and internal fixation is indicated.  Will get this accomplished sooner than later.  Risks and benefits of surgery understood by the patient. Dictated by:   Alinda Deem,  M.D. Attending Physician:  Alinda Deem DD:  05/05/01 TD:  05/06/01 Job: [id number]

## 2010-12-17 NOTE — Op Note (Signed)
[place]. [facility]  Patient:    Cody Phelps, Cody Phelps Visit Number: [id number] MRN: [id number]          Service Type: SUR Location: [phone] Attending Physician:  Alinda Deem Dictated by:   Alinda Deem, M.D. Admit Date:  05/05/2001                             Operative Report  INCOMPLETE  PREOPERATIVE DIAGNOSIS:  Right ankle bimalleolar fracture.  POSTOPERATIVE DIAGNOSIS:  Right ankle bimalleolar fracture.  PROCEDURE: Dictated by:   Alinda Deem, M.D. Attending Physician:  Alinda Deem DD:  05/05/01 TD:  05/07/01 Job: [id number]

## 2011-04-12 ENCOUNTER — Telehealth: Payer: Self-pay | Admitting: Internal Medicine

## 2011-04-12 NOTE — Telephone Encounter (Signed)
Spoke with Florentina Addison, we have contacted patient for an appointment, last seen 2010. Patient will need appointment to complete paperwork

## 2011-04-12 NOTE — Telephone Encounter (Signed)
Florentina Addison called to check status of paperword for concealed gun permit for patient--said it was sent 02/06/2011

## 2011-06-05 ENCOUNTER — Encounter: Payer: Self-pay | Admitting: *Deleted

## 2011-06-05 ENCOUNTER — Emergency Department (HOSPITAL_BASED_OUTPATIENT_CLINIC_OR_DEPARTMENT_OTHER)
Admission: EM | Admit: 2011-06-05 | Discharge: 2011-06-05 | Disposition: A | Discharge status: Home / Self Care | Payer: Medicare Other | Attending: Emergency Medicine | Admitting: Emergency Medicine

## 2011-06-05 DIAGNOSIS — I10 Essential (primary) hypertension: Secondary | ICD-10-CM | POA: Insufficient documentation

## 2011-06-05 DIAGNOSIS — E119 Type 2 diabetes mellitus without complications: Secondary | ICD-10-CM | POA: Insufficient documentation

## 2011-06-05 DIAGNOSIS — Z79899 Other long term (current) drug therapy: Secondary | ICD-10-CM | POA: Insufficient documentation

## 2011-06-05 DIAGNOSIS — J209 Acute bronchitis, unspecified: Secondary | ICD-10-CM

## 2011-06-05 HISTORY — DX: Essential (primary) hypertension: I10

## 2011-06-05 MED ORDER — AZITHROMYCIN 250 MG PO TABS: 250.0000 mg | ORAL_TABLET | Freq: Every day | ORAL | Status: AC

## 2011-06-05 MED ORDER — HYDROCOD POLST-CHLORPHEN POLST 10-8 MG/5ML PO LQCR: 5.0000 mL | Freq: Two times a day (BID) | ORAL | Status: DC | PRN

## 2011-06-05 NOTE — ED Notes (Signed)
Pt states he has had sinus infections before and this feels like one. Now has "moved down into his chest".

## 2011-06-05 NOTE — ED Notes (Signed)
Pt given rx x 2 for zpack and tussionex- d/c home with family

## 2011-06-06 NOTE — ED Provider Notes (Signed)
History     CSN: [id number] Arrival date & time: 06/05/2011  5:52 PM   First MD Initiated Contact with Patient 06/05/11 1809      Chief Complaint  Patient presents with  . Sinusitis    (Consider location/radiation/quality/duration/timing/severity/associated sxs/prior treatment) HPI Comments: Patient is a 70 year old man whose head cough for about 3 weeks. His wife says he often gets bronchitis in the fall. He has taken Delsym cough medicine without relief. He's also tried Claritin. He had sinus surgery 2 or 3 years ago. He is a diabetic person. He is a nonsmoker. He had some fever last week. For his symptoms have persisted for 3 weeks, he sought evaluation.  Patient is a 70 y.o. male presenting with URI. The history is provided by the patient and the spouse. No language interpreter was used.  URI The primary symptoms include fever and cough. Primary symptoms do not include sore throat, wheezing, myalgias or rash. The current episode started more than 1 week ago. This is a new problem.  The fever began more than 1 week ago. The fever has been unchanged since its onset. The maximum temperature recorded prior to his arrival was unknown.  Symptoms associated with the illness include sinus pressure, congestion and rhinorrhea. Risk factors for severe complications from URI include being elderly and diabetes mellitus.    Past Medical History  Diagnosis Date  . Hypertension   . Diabetes mellitus     Past Surgical History  Procedure Date  . Appendectomy   . Ankle surgery   . Nasal polyp excision     History reviewed. No pertinent family history.  History  Substance Use Topics  . Smoking status: Never Smoker   . Smokeless tobacco: Not on file  . Alcohol Use: Yes      Review of Systems  Constitutional: Positive for fever.  HENT: Positive for congestion, rhinorrhea and sinus pressure. Negative for sore throat.   Eyes: Negative.   Respiratory: Positive for cough. Negative for  wheezing.   Cardiovascular: Negative.   Gastrointestinal: Negative.   Genitourinary: Negative.   Musculoskeletal: Negative.  Negative for myalgias.  Skin: Negative.  Negative for rash.  Neurological: Negative.   Psychiatric/Behavioral: Negative.     Allergies  Review of patient's allergies indicates no known allergies.  Home Medications   Current Outpatient Rx  Name Route Sig Dispense Refill  . AMLODIPINE BESY-BENAZEPRIL HCL 5-40 MG PO CAPS Oral Take 1 capsule by mouth daily.      . COLESEVELAM HCL 625 MG PO TABS Oral Take 1,875 mg by mouth 2 (two) times daily with a meal.      . DEXTROMETHORPHAN POLISTIREX 30 MG/5ML PO LQCR Oral Take 60 mg by mouth 2 (two) times daily as needed. For cough      . ESOMEPRAZOLE MAGNESIUM 40 MG PO CPDR Oral Take 40 mg by mouth daily before breakfast.      . EZETIMIBE-SIMVASTATIN 10-80 MG PO TABS Oral Take 1 tablet by mouth daily.      Marland Kitchen HYDROCHLOROTHIAZIDE 25 MG PO TABS Oral Take 12.5 mg by mouth daily.      . INSULIN GLARGINE 100 UNIT/ML [place] SOLN Subcutaneous Inject 35-38 Units into the skin daily.      Marland Kitchen LORATADINE 10 MG PO TABS Oral Take 10 mg by mouth daily.      Carma Leaven M PLUS PO TABS Oral Take 1 tablet by mouth daily.      Marland Kitchen NABUMETONE 750 MG PO TABS Oral Take 750  mg by mouth once as needed. For pain    . OMEGA-3-ACID ETHYL ESTERS 1 G PO CAPS Oral Take 2 g by mouth 2 (two) times daily.      Marland Kitchen PIOGLITAZONE HCL-METFORMIN HCL 15-850 MG PO TABS Oral Take 1 tablet by mouth 2 (two) times daily with a meal.      . PRESCRIPTION MEDICATION Oral Take 0.5 tablets by mouth daily.      Marland Kitchen ROPINIROLE HCL PO Oral Take 1 tablet by mouth daily.      . AZITHROMYCIN 250 MG PO TABS Oral Take 1 tablet (250 mg total) by mouth daily. Take first 2 tablets together, then 1 every day until finished. 6 tablet 0  . HYDROCOD POLST-CHLORPHEN POLST 10-8 MG/5ML PO LQCR Oral Take 5 mLs by mouth every 12 (twelve) hours as needed. 60 mL 0  . MOMETASONE FUROATE 50 MCG/ACT NA SUSP  Nasal Place 2 sprays into the nose daily as needed. For allergies and congestion       BP 130/66  Pulse 72  Temp(Src) 98.2 F (36.8 C) (Oral)  Resp 20  Ht 5\' 9"  (1.753 m)  Wt 224 lb (101.606 kg)  BMI 33.08 kg/m2  SpO2 99%  Physical Exam  Nursing note and vitals reviewed. Constitutional: He is oriented to person, place, and time. He appears well-developed and well-nourished. Distressed: he has a deep hacking cough.  HENT:  Head: Normocephalic and atraumatic.  Right Ear: External ear normal.  Left Ear: External ear normal.  Mouth/Throat: Oropharynx is clear and moist.  Eyes: Conjunctivae and EOM are normal. Pupils are equal, round, and reactive to light.  Neck: Normal range of motion. Neck supple.  Cardiovascular: Normal rate and regular rhythm.   Pulmonary/Chest: Effort normal and breath sounds normal.  Abdominal: Soft. Bowel sounds are normal.  Musculoskeletal: Normal range of motion.  Lymphadenopathy:    He has no cervical adenopathy.  Neurological: He is oriented to person, place, and time.       No sensory or motor deficit.  Skin: Skin is warm and dry.  Psychiatric: He has a normal mood and affect. His behavior is normal.    ED Course  Procedures (including critical care time)   1. Acute bronchitis     Disposition: The patient was treated with azithromycin and + next for bronchitis.       Carleene Cooper III, MD 06/07/11 [phone]

## 2011-06-07 ENCOUNTER — Encounter (HOSPITAL_BASED_OUTPATIENT_CLINIC_OR_DEPARTMENT_OTHER): Payer: Self-pay | Admitting: Emergency Medicine

## 2011-06-15 ENCOUNTER — Encounter: Payer: Self-pay | Admitting: Internal Medicine

## 2011-06-16 ENCOUNTER — Encounter: Payer: Self-pay | Admitting: Internal Medicine

## 2011-06-16 ENCOUNTER — Ambulatory Visit (INDEPENDENT_AMBULATORY_CARE_PROVIDER_SITE_OTHER): Payer: Medicare Other | Admitting: Internal Medicine

## 2011-06-16 DIAGNOSIS — R011 Cardiac murmur, unspecified: Secondary | ICD-10-CM

## 2011-06-16 DIAGNOSIS — E119 Type 2 diabetes mellitus without complications: Secondary | ICD-10-CM

## 2011-06-16 DIAGNOSIS — J209 Acute bronchitis, unspecified: Secondary | ICD-10-CM

## 2011-06-16 MED ORDER — CEFUROXIME AXETIL 500 MG PO TABS: 500.0000 mg | ORAL_TABLET | Freq: Two times a day (BID) | ORAL | Status: AC

## 2011-06-16 MED ORDER — FLUTICASONE-SALMETEROL 250-50 MCG/DOSE IN AEPB: 1.0000 | INHALATION_SPRAY | Freq: Two times a day (BID) | RESPIRATORY_TRACT | Status: DC

## 2011-06-16 NOTE — Progress Notes (Signed)
  Subjective:    Patient ID: Cody Phelps, male    DOB: 28-Jun-1941, 70 y.o.   MRN: [id number]  HPI    Review of Systems in reference to the  murmur; he denies edema or paroxysmal nocturnal dyspnea. He states he had a physical by Dr. Evlyn Kanner several months ago &  no mention was made of the murmur. Review of limited records at this office reveals no cardiac exam findings on record     Objective:   Physical Exam no significant physical findings in reference to the systolic murmur are present.        Assessment & Plan:  Ongoing monitoring will be indicated. I instructed him as to which symptoms to monitor.  A protocol for ear wax accumulation was provided in writing.

## 2011-06-16 NOTE — Progress Notes (Signed)
  Subjective:    Patient ID: Cody Phelps, male    DOB: 27-May-1941, 70 y.o.   MRN: [id number]  HPI Respiratory tract infection Onset/symptoms:5-6 weeks ago as sinus irritation after blowing leaves Progression of symptoms:to chest 2-3 weeks ago Treatments/response:Zpack from [facility] 11/4 Present symptoms: Fever/chills/sweats:no Frontal headache:no  Facial pain:no Nasal purulence:no Sore throat:not now Dental pain:no Lymphadenopathy:no Wheezing/shortness of breath:no Cough/sputum/hemoptysis:clear sputum Associated extrinsic/allergic symptoms:itchy eyes/ sneezing:no Past medical history: Seasonal allergies:yes/asthma:no Smoking history:remotely, minimally       Review of Systems FBS 130-200; he is followed by Dr. Evlyn Kanner, endocrinologist. His most recent A1c was "7.8"     Objective:   Physical Exam General appearance is of good health and nourishment; no acute distress or increased work of breathing is present.  No  lymphadenopathy about the head, neck, or axilla noted.   Eyes: No conjunctival inflammation or lid edema is present. There is no scleral icterus.  Ears:  External ear exam shows no significant lesions or deformities.  Otoscopic examination reveals  Wax , L > R Nose:  External nasal examination shows no deformity or inflammation. Nasal mucosa are pink and moist without lesions or exudates. No septal dislocation .No obstruction to airflow.   Oral exam: Dental hygiene is good; lips and gums are healthy appearing.There is no oropharyngeal erythema or exudate noted.    Heart:  Normal rate and regular rhythm. S1 and S2 normal without gallop,  click, rub . Diffuse Grade 1.5 systolic murmur Lungs:.No increased work of breathing. He has mild rhonchi mainly in the left lower lobe. There is no significant wheezing.    Extremities:  No cyanosis, edema, or clubbing  noted    Skin: Warm & dry         Assessment & Plan:    #1 bronchitis with mild reactive airways findings   #2 diabetes, control is fair at best  #3 significant systolic murmur; he states he is unaware of this.

## 2011-06-16 NOTE — Patient Instructions (Addendum)
Plain Mucinex for thick secretions ;force NON dairy fluids . Use a Neti pot daily as needed for sinus congestion Advair one inhalation every 12 hours; gargle and spit after use  Eat a low-fat diet with lots of fruits and vegetables, up to 7-9 servings per day. Avoid obesity; your goal is waist measurement < 40 inches.Consume less than 40 grams of sugar per day from foods & drinks with High Fructose Corn Sugar as #1,2,3 or # 4 on label. Follow the low carb nutrition program in [employer] as closely as possible to prevent Diabetes progression & complications. White carbohydrates (potatoes, rice, bread, and pasta) have a high spike of sugar and a high load of sugar. For example a  baked potato has a cup of sugar and a  french fry  2 teaspoons of sugar. Yams, wild  rice, whole grained bread &  wheat pasta have been much lower spike and load of sugar. Portions should be the size of a deck of cards or your palm.  The A1c test is used primarily to monitor the glucose control of diabetics over time. The goal of those with diabetes is to keep their blood glucose levels as close to normal as possible. This helps to minimize the complications caused by chronically elevated glucose levels, such as progressive damage to body organs like the kidneys, eyes, cardiovascular system, and nerves. The A1c test gives a picture of the average amount of glucose in the blood over the last few months. It can help a patient and his doctor know if the measures they are taking to control the patient's diabetes are successful or need to be adjusted.    NORMAL VALUES  Non diabetic adults: 5 %-6.1%  Good diabetic control: 6.2-6.4 %  Fair diabetic control: 6.5-7%  Poor diabetic control: greater than 7 % ( except with additional factors such as  advanced age; significant coronary or neurologic disease,etc). Check the A1c every 6 months if it is < 6.5%; every 4 months if  6.5% or higher. Goals for home glucose monitoring are :  fasting  or morning glucose goal of  90-150. Two hours after any meal , goal = < 180, preferably < 150.

## 2011-08-02 HISTORY — PX: CARPAL TUNNEL RELEASE: SHX101

## 2011-08-22 ENCOUNTER — Encounter: Payer: Self-pay | Admitting: Family Medicine

## 2011-08-22 ENCOUNTER — Ambulatory Visit (INDEPENDENT_AMBULATORY_CARE_PROVIDER_SITE_OTHER): Payer: Medicare Other | Admitting: Family Medicine

## 2011-08-22 VITALS — BP 128/70 | HR 81 | Temp 98.7°F | Wt 229.0 lb

## 2011-08-22 DIAGNOSIS — H6690 Otitis media, unspecified, unspecified ear: Secondary | ICD-10-CM

## 2011-08-22 MED ORDER — CEFUROXIME AXETIL 500 MG PO TABS: 500.0000 mg | ORAL_TABLET | Freq: Two times a day (BID) | ORAL | Status: AC

## 2011-08-22 MED ORDER — OFLOXACIN 0.3 % OT SOLN: OTIC | Status: DC

## 2011-08-22 MED ORDER — CEFUROXIME AXETIL 500 MG PO TABS: 500.0000 mg | ORAL_TABLET | Freq: Two times a day (BID) | ORAL | Status: DC

## 2011-08-22 NOTE — Patient Instructions (Signed)
Otitis Media You or your child has otitis media. This is an infection of the middle chamber of the ear. This condition is common in young children and often follows upper respiratory infections. Symptoms of otitis media may include earache or ear fullness, hearing loss, or fever. If the eardrum ruptures, a middle ear infection may also cause bloody or pus-like discharge from the ear. Fussiness, irritability, and persistent crying may be the only signs of otitis media in small children. Otitis media can be caused by a bacteria or a virus. Antibiotics may be used to treat bacterial otitis media. But antibiotics are not effective against viral infections. Not every case of bacterial otitis media requires antibiotics and depending on age, severity of infection, and other risk factors, observation may be all that is required. Ear drops or oral medicines may be prescribed to reduce pain, fever, or congestion. Babies with ear infections should not be fed while lying on their backs. This increases the pressure and pain in the ear. Do not put cotton in the ear canal or clean it with cotton swabs. Swimming should be avoided if the eardrum has ruptured or if there is drainage from the ear canal. If your child experiences recurrent infections, your child may need to be referred to an Ear, Nose, and Throat specialist. HOME CARE INSTRUCTIONS   Take any antibiotic as directed by your caregiver. You or your child may feel better in a few days, but take all medicine or the infection may not respond and may become more difficult to treat.   Only take over-the-counter or prescription medicines for pain, discomfort, or fever as directed by your caregiver. Do not give aspirin to children.  Otitis media can lead to complications including rupture of the eardrum, long-term hearing loss, and more severe infections. Call your caregiver for follow-up care at the end of treatment. SEEK IMMEDIATE MEDICAL CARE IF:   Your or your  child's problems do not improve within 2 to 3 days.   You or your child has an oral temperature above 102 F (38.9 C), not controlled by medicine.   Your baby is older than 3 months with a rectal temperature of 102 F (38.9 C) or higher.   Your baby is 3 months old or younger with a rectal temperature of 100.4 F (38 C) or higher.   Your child develops increased fussiness.   You or your child develops a stiff neck, severe headache, or confusion.   There is swelling around the ear.   There is dizziness, vomiting, unusual sleepiness, seizures, or twitching of facial muscles.   The pain or ear drainage persists beyond 2 days of antibiotic treatment.  Document Released: 08/25/2004 Document Revised: 03/30/2011 Document Reviewed: 11/13/2009 [employer] Patient Information 2012 [employer]. 

## 2011-08-22 NOTE — Progress Notes (Signed)
  Subjective:     Cody Phelps is a 71 y.o. male who presents with ear pain and possible ear infection. Symptoms include: left ear pain. Onset of symptoms was several days ago, and have been gradually worsening since that time. Associated symptoms include: none.  Patient denies: achiness, chills, congestion, coryza, fever , headache, low grade fever, non productive cough, post nasal drip, productive cough, sinus pressure, sneezing and sore throat. He is drinking plenty of fluids.  The following portions of the patient's history were reviewed and updated as appropriate: allergies, current medications, past family history, past medical history, past social history, past surgical history and problem list.  Review of Systems Pertinent items are noted in HPI.   Objective:    BP 128/70  Pulse 81  Temp(Src) 98.7 F (37.1 C) (Oral)  Wt 229 lb (103.874 kg)  SpO2 97% General:  alert, cooperative, appears stated age and no distress  Right Ear: normal landmarks and mobility  Left Ear: Tm and canal errythematous after wax removal  Mouth:  lips, mucosa, and tongue normal; teeth and gums normal  Neck: no adenopathy, no carotid bruit, no JVD, supple, symmetrical, trachea midline and thyroid not enlarged, symmetric, no tenderness/mass/nodules     Assessment:    Left acute otitis media   Plan:    Treatment: ceftin and floxin gtts. OTC analgesia as needed. Fluids, rest, avoid carbonated/alcoholic and caffeinated beverages.  Follow up in a few weeks if not improving.

## 2012-01-04 ENCOUNTER — Encounter (INDEPENDENT_AMBULATORY_CARE_PROVIDER_SITE_OTHER): Payer: Medicare Other | Admitting: Ophthalmology

## 2012-01-04 DIAGNOSIS — I10 Essential (primary) hypertension: Secondary | ICD-10-CM

## 2012-01-04 DIAGNOSIS — H35419 Lattice degeneration of retina, unspecified eye: Secondary | ICD-10-CM

## 2012-01-04 DIAGNOSIS — H33309 Unspecified retinal break, unspecified eye: Secondary | ICD-10-CM

## 2012-01-04 DIAGNOSIS — H43819 Vitreous degeneration, unspecified eye: Secondary | ICD-10-CM

## 2012-01-04 DIAGNOSIS — H3560 Retinal hemorrhage, unspecified eye: Secondary | ICD-10-CM

## 2012-01-04 DIAGNOSIS — E1139 Type 2 diabetes mellitus with other diabetic ophthalmic complication: Secondary | ICD-10-CM

## 2012-01-09 ENCOUNTER — Ambulatory Visit (INDEPENDENT_AMBULATORY_CARE_PROVIDER_SITE_OTHER): Payer: Medicare Other | Admitting: Ophthalmology

## 2012-01-09 DIAGNOSIS — H33309 Unspecified retinal break, unspecified eye: Secondary | ICD-10-CM

## 2012-01-10 ENCOUNTER — Ambulatory Visit (INDEPENDENT_AMBULATORY_CARE_PROVIDER_SITE_OTHER): Payer: Medicare Other | Admitting: Ophthalmology

## 2012-01-23 ENCOUNTER — Ambulatory Visit (INDEPENDENT_AMBULATORY_CARE_PROVIDER_SITE_OTHER): Payer: Medicare Other | Admitting: Ophthalmology

## 2012-01-23 DIAGNOSIS — H33309 Unspecified retinal break, unspecified eye: Secondary | ICD-10-CM

## 2012-03-20 ENCOUNTER — Encounter (HOSPITAL_COMMUNITY)
Admission: RE | Disposition: A | Discharge status: Home / Self Care | Payer: Self-pay | Source: Ambulatory Visit | Attending: Cardiology

## 2012-03-20 ENCOUNTER — Encounter (HOSPITAL_COMMUNITY): Payer: Self-pay

## 2012-03-20 ENCOUNTER — Encounter (HOSPITAL_COMMUNITY): Payer: Self-pay | Admitting: *Deleted

## 2012-03-20 ENCOUNTER — Ambulatory Visit (HOSPITAL_COMMUNITY)
Admission: RE | Admit: 2012-03-20 | Discharge: 2012-03-20 | Disposition: A | Discharge status: Home / Self Care | Payer: Medicare Other | Source: Ambulatory Visit | Attending: Cardiology | Admitting: Cardiology

## 2012-03-20 DIAGNOSIS — I359 Nonrheumatic aortic valve disorder, unspecified: Secondary | ICD-10-CM | POA: Insufficient documentation

## 2012-03-20 HISTORY — DX: Cardiac murmur, unspecified: R01.1

## 2012-03-20 HISTORY — PX: TEE WITHOUT CARDIOVERSION: SHX5443

## 2012-03-20 HISTORY — DX: Gastro-esophageal reflux disease without esophagitis: K21.9

## 2012-03-20 LAB — GLUCOSE, CAPILLARY
Glucose-Capillary: 84 mg/dL (ref 70–99)
Glucose-Capillary: 64 mg/dL — ABNORMAL LOW (ref 70–99)

## 2012-03-20 SURGERY — ECHOCARDIOGRAM, TRANSESOPHAGEAL
Anesthesia: Moderate Sedation

## 2012-03-20 MED ORDER — MIDAZOLAM HCL 10 MG/2ML IJ SOLN
INTRAMUSCULAR | Status: DC | PRN
  Administered 2012-03-20: 1 mg via INTRAVENOUS
  Administered 2012-03-20: 2 mg via INTRAVENOUS

## 2012-03-20 MED ORDER — FENTANYL CITRATE 0.05 MG/ML IJ SOLN
INTRAMUSCULAR | Status: DC | PRN
  Administered 2012-03-20 (×2): 25 ug via INTRAVENOUS

## 2012-03-20 MED ORDER — MIDAZOLAM HCL 5 MG/ML IJ SOLN
INTRAMUSCULAR | Status: AC
  Filled 2012-03-20: qty 1

## 2012-03-20 MED ORDER — FENTANYL CITRATE 0.05 MG/ML IJ SOLN
INTRAMUSCULAR | Status: AC
  Filled 2012-03-20: qty 2

## 2012-03-20 MED ORDER — BUTAMBEN-TETRACAINE-BENZOCAINE 2-2-14 % EX AERO
INHALATION_SPRAY | CUTANEOUS | Status: DC | PRN
  Administered 2012-03-20: 2 via TOPICAL

## 2012-03-20 MED ORDER — SODIUM CHLORIDE 0.9 % IV SOLN
INTRAVENOUS | Status: DC
  Administered 2012-03-20: 500 mL via INTRAVENOUS

## 2012-03-20 NOTE — CV Procedure (Signed)
TEE: Under moderate sedation, TEE was performed without complications: LV: Normal. Normal EF. RV: Normal LA: Normal. Left atrial appendage very small : Normal without thrombus. Normal function. Inter atrial septum is intact without defect. Double contrast study negative for atrial level shunting. No late appearance of bubbles either. RA: Normal  MV: Normal Trace MR. TV: Normal Trace TR AV: Mild calcification and senile degeneration. Mild AS. AVA 1.7 cm2. PV: Normal. Trace PI.  Thoracic and ascending aorta: Normal without significant plaque or atheromatous changes.

## 2012-03-20 NOTE — H&P (Signed)
Please see paper chart for details. TEE being performed to evaluate aortic stenosis.

## 2012-03-20 NOTE — Interval H&P Note (Signed)
History and Physical Interval Note:  03/20/2012 11:14 AM  Cody Phelps  has presented today for surgery, with the diagnosis of aortic stenosis  The various methods of treatment have been discussed with the patient and family. After consideration of risks, benefits and other options for treatment, the patient has consented to  Procedure(s) (LRB): TRANSESOPHAGEAL ECHOCARDIOGRAM (TEE) (N/A) as a surgical intervention .  The patient's history has been reviewed, patient examined, no change in status, stable for surgery.  I have reviewed the patient's chart and labs.  Questions were answered to the patient's satisfaction.     Pamella Pert

## 2012-03-20 NOTE — Progress Notes (Signed)
  Echocardiogram Echocardiogram Transesophageal has been performed.  Cody Phelps 03/20/2012, 1:46 PM

## 2012-03-21 ENCOUNTER — Encounter (HOSPITAL_COMMUNITY): Payer: Self-pay | Admitting: Cardiology

## 2012-05-04 ENCOUNTER — Encounter: Payer: Self-pay | Admitting: Family Medicine

## 2012-05-04 ENCOUNTER — Ambulatory Visit (INDEPENDENT_AMBULATORY_CARE_PROVIDER_SITE_OTHER): Payer: Medicare Other | Admitting: Family Medicine

## 2012-05-04 VITALS — BP 142/60 | HR 86 | Temp 98.2°F | Resp 16 | Wt 230.0 lb

## 2012-05-04 DIAGNOSIS — R591 Generalized enlarged lymph nodes: Secondary | ICD-10-CM

## 2012-05-04 DIAGNOSIS — J209 Acute bronchitis, unspecified: Secondary | ICD-10-CM | POA: Insufficient documentation

## 2012-05-04 DIAGNOSIS — R599 Enlarged lymph nodes, unspecified: Secondary | ICD-10-CM

## 2012-05-04 HISTORY — DX: Acute bronchitis, unspecified: J20.9

## 2012-05-04 MED ORDER — BENZONATATE 200 MG PO CAPS: 200.0000 mg | ORAL_CAPSULE | Freq: Three times a day (TID) | ORAL | Status: DC | PRN

## 2012-05-04 MED ORDER — AZITHROMYCIN 250 MG PO TABS: ORAL_TABLET | ORAL | Status: DC

## 2012-05-04 NOTE — Progress Notes (Signed)
  Subjective:    Patient ID: Cody Phelps, male    DOB: Aug 26, 1940, 71 y.o.   MRN: [id number]  HPI 'annual bronchitis'- sxs started 1 week ago w/ dry cough.  + nasal congestion.  No facial pain/pressure, no fever, no ear pain.  Some LN swelling on R- was noted by radiologist- not painful, uncertain how long this has been present.  Wife states it's visible.  Pt grew beard 'bc of these jowls'.  Cough is mostly dry, had 1 episode that was productive.  No known sick contacts.  No wheezing, SOB.  Taking Vit C, Delsym w/ some relief.   Review of Systems For ROS see HPI     Objective:   Physical Exam  Vitals reviewed. Constitutional: He appears well-developed and well-nourished. No distress.  HENT:  Head: Normocephalic and atraumatic.  Right Ear: Tympanic membrane normal.  Left Ear: Tympanic membrane normal.  Nose: No mucosal edema or rhinorrhea. Right sinus exhibits no maxillary sinus tenderness and no frontal sinus tenderness. Left sinus exhibits no maxillary sinus tenderness and no frontal sinus tenderness.  Mouth/Throat: Mucous membranes are normal. No oropharyngeal exudate, posterior oropharyngeal edema or posterior oropharyngeal erythema.  Eyes: Conjunctivae normal and EOM are normal. Pupils are equal, round, and reactive to light.  Neck: Normal range of motion. Neck supple. No thyromegaly present.       Bilateral soft tissue swelling at angles of mandible, no TTP, no LAD.  Cardiovascular: Normal rate, regular rhythm and normal heart sounds.   Pulmonary/Chest: Effort normal and breath sounds normal. No respiratory distress. He has no wheezes.       + hacking cough  Lymphadenopathy:    He has no cervical adenopathy.  Skin: Skin is warm and dry.          Assessment & Plan:

## 2012-05-04 NOTE — Patient Instructions (Addendum)
Start the Zpack as directed for the bronchitis Use the Tessalon for cough Add Mucinex to thin your congestion Drink plenty of fluids REST! We'll call you with your ultrasound appt Call with any questions or concerns Hang in there!!

## 2012-05-05 NOTE — Assessment & Plan Note (Signed)
New to provider.  Start Zpack, cough meds.  Reviewed supportive care and red flags that should prompt return.  Pt expressed understanding and is in agreement w/ plan.

## 2012-05-05 NOTE — Assessment & Plan Note (Signed)
New.  Not true LAD but appears to be bilateral soft tissue swelling of unknown etiology.  Get [place] to assess.  Pt expressed understanding and is in agreement w/ plan.

## 2012-05-10 ENCOUNTER — Ambulatory Visit (HOSPITAL_BASED_OUTPATIENT_CLINIC_OR_DEPARTMENT_OTHER)
Admission: RE | Admit: 2012-05-10 | Discharge: 2012-05-10 | Disposition: A | Discharge status: Home / Self Care | Payer: Medicare Other | Source: Ambulatory Visit | Attending: Family Medicine | Admitting: Family Medicine

## 2012-05-10 DIAGNOSIS — R599 Enlarged lymph nodes, unspecified: Secondary | ICD-10-CM | POA: Insufficient documentation

## 2012-05-10 DIAGNOSIS — R591 Generalized enlarged lymph nodes: Secondary | ICD-10-CM

## 2012-05-17 ENCOUNTER — Ambulatory Visit (INDEPENDENT_AMBULATORY_CARE_PROVIDER_SITE_OTHER): Payer: Medicare Other | Admitting: Family Medicine

## 2012-05-17 VITALS — BP 118/75 | HR 70 | Temp 98.3°F | Ht 68.25 in | Wt 233.5 lb

## 2012-05-17 DIAGNOSIS — J209 Acute bronchitis, unspecified: Secondary | ICD-10-CM

## 2012-05-17 NOTE — Patient Instructions (Addendum)
This is likely due to post nasal drip from untreated allergies Start Claritin or Zyrtec daily for nasal allergies Continue the Mucinex to thin your congestion Tessalon as needed for cough Coricidin HBP to decrease nasal congestion and drainage Call if no improvement- we'll get a chest xray Hang in there!!!

## 2012-05-17 NOTE — Assessment & Plan Note (Signed)
Improved.  Suspect that pt's current cough is a post-infectious and PND combo.  Start OTC antihistamine and decongestant.  Continue mucinex and tessalon prn.  No evidence of bacterial infxn.  No need for repeat abx.  Offered CXR- pt declined.  Reviewed supportive care and red flags that should prompt return.  Pt expressed understanding and is in agreement w/ plan.

## 2012-05-17 NOTE — Progress Notes (Signed)
  Subjective:    Patient ID: Cody Phelps, male    DOB: Nov 01, 1940, 71 y.o.   MRN: [id number]  HPI Cough- was seen 10/4 and dx'd w/ bronchitis.  tx'd w/ Zpack.  sxs improved but cough remains.  Cough is occasionally productive of 'a little mucous'.  No fevers.  + PND.  No ear pain.  No facial pain/pressure.  No known sick.  Cough is worst in the AM, improves w/ mucinex and tessalon.  Cough is not keeping him awake at night.   Review of Systems For ROS see HPI     Objective:   Physical Exam  Constitutional: He appears well-developed and well-nourished. No distress.  HENT:  Head: Normocephalic and atraumatic.  Right Ear: Tympanic membrane normal.  Left Ear: Tympanic membrane normal.  Nose: No mucosal edema or rhinorrhea. Right sinus exhibits no maxillary sinus tenderness and no frontal sinus tenderness. Left sinus exhibits no maxillary sinus tenderness and no frontal sinus tenderness.  Mouth/Throat: Mucous membranes are normal. No oropharyngeal exudate, posterior oropharyngeal edema or posterior oropharyngeal erythema.       No TTP over sinuses + turbinate edema + PND TMs normal bilaterally  Eyes: Conjunctivae normal and EOM are normal. Pupils are equal, round, and reactive to light.  Neck: Normal range of motion. Neck supple.  Cardiovascular: Normal rate, regular rhythm and normal heart sounds.   Pulmonary/Chest: Effort normal and breath sounds normal. No respiratory distress. He has no wheezes.       + hacking cough  Lymphadenopathy:    He has no cervical adenopathy.  Skin: Skin is warm and dry.          Assessment & Plan:

## 2012-05-25 ENCOUNTER — Ambulatory Visit (INDEPENDENT_AMBULATORY_CARE_PROVIDER_SITE_OTHER): Payer: Medicare Other | Admitting: Ophthalmology

## 2012-05-25 DIAGNOSIS — H43819 Vitreous degeneration, unspecified eye: Secondary | ICD-10-CM

## 2012-05-25 DIAGNOSIS — H33309 Unspecified retinal break, unspecified eye: Secondary | ICD-10-CM

## 2012-05-28 ENCOUNTER — Other Ambulatory Visit: Payer: Self-pay

## 2012-05-28 MED ORDER — BENZONATATE 200 MG PO CAPS: 200.0000 mg | ORAL_CAPSULE | Freq: Three times a day (TID) | ORAL | Status: DC | PRN

## 2012-05-28 NOTE — Telephone Encounter (Signed)
Rx sent pt aware.   MW  

## 2013-09-16 ENCOUNTER — Encounter (HOSPITAL_BASED_OUTPATIENT_CLINIC_OR_DEPARTMENT_OTHER): Payer: Self-pay | Admitting: Emergency Medicine

## 2013-09-16 ENCOUNTER — Emergency Department (HOSPITAL_BASED_OUTPATIENT_CLINIC_OR_DEPARTMENT_OTHER)
Admission: EM | Admit: 2013-09-16 | Discharge: 2013-09-16 | Disposition: A | Discharge status: Home / Self Care | Payer: Medicare Other | Attending: Emergency Medicine | Admitting: Emergency Medicine

## 2013-09-16 DIAGNOSIS — E119 Type 2 diabetes mellitus without complications: Secondary | ICD-10-CM | POA: Insufficient documentation

## 2013-09-16 DIAGNOSIS — J4489 Other specified chronic obstructive pulmonary disease: Secondary | ICD-10-CM | POA: Insufficient documentation

## 2013-09-16 DIAGNOSIS — J40 Bronchitis, not specified as acute or chronic: Secondary | ICD-10-CM

## 2013-09-16 DIAGNOSIS — Z792 Long term (current) use of antibiotics: Secondary | ICD-10-CM | POA: Insufficient documentation

## 2013-09-16 DIAGNOSIS — J209 Acute bronchitis, unspecified: Secondary | ICD-10-CM | POA: Insufficient documentation

## 2013-09-16 DIAGNOSIS — I10 Essential (primary) hypertension: Secondary | ICD-10-CM | POA: Insufficient documentation

## 2013-09-16 DIAGNOSIS — K219 Gastro-esophageal reflux disease without esophagitis: Secondary | ICD-10-CM | POA: Insufficient documentation

## 2013-09-16 DIAGNOSIS — Z794 Long term (current) use of insulin: Secondary | ICD-10-CM | POA: Insufficient documentation

## 2013-09-16 DIAGNOSIS — R011 Cardiac murmur, unspecified: Secondary | ICD-10-CM | POA: Insufficient documentation

## 2013-09-16 DIAGNOSIS — Z79899 Other long term (current) drug therapy: Secondary | ICD-10-CM | POA: Insufficient documentation

## 2013-09-16 DIAGNOSIS — J449 Chronic obstructive pulmonary disease, unspecified: Secondary | ICD-10-CM | POA: Insufficient documentation

## 2013-09-16 DIAGNOSIS — G4730 Sleep apnea, unspecified: Secondary | ICD-10-CM | POA: Insufficient documentation

## 2013-09-16 DIAGNOSIS — Z8719 Personal history of other diseases of the digestive system: Secondary | ICD-10-CM | POA: Insufficient documentation

## 2013-09-16 MED ORDER — AZITHROMYCIN 250 MG PO TABS: 250.0000 mg | ORAL_TABLET | Freq: Every day | ORAL | Status: DC

## 2013-09-16 MED ORDER — BENZONATATE 100 MG PO CAPS: 100.0000 mg | ORAL_CAPSULE | Freq: Three times a day (TID) | ORAL | Status: DC

## 2013-09-16 NOTE — ED Notes (Signed)
NP at bedside.

## 2013-09-16 NOTE — Discharge Instructions (Signed)
Bronchitis Bronchitis is inflammation of the airways that extend from the windpipe into the lungs (bronchi). The inflammation often causes mucus to develop, which leads to a cough. If the inflammation becomes severe, it may cause shortness of breath. CAUSES  Bronchitis may be caused by:   Viral infections.   Bacteria.   Cigarette smoke.   Allergens, pollutants, and other irritants.  SIGNS AND SYMPTOMS  The most common symptom of bronchitis is a frequent cough that produces mucus. Other symptoms include:  Fever.   Body aches.   Chest congestion.   Chills.   Shortness of breath.   Sore throat.  DIAGNOSIS  Bronchitis is usually diagnosed through a medical history and physical exam. Tests, such as chest X-rays, are sometimes done to rule out other conditions.  TREATMENT  You may need to avoid contact with whatever caused the problem (smoking, for example). Medicines are sometimes needed. These may include:  Antibiotics. These may be prescribed if the condition is caused by bacteria.  Cough suppressants. These may be prescribed for relief of cough symptoms.   Inhaled medicines. These may be prescribed to help open your airways and make it easier for you to breathe.   Steroid medicines. These may be prescribed for those with recurrent (chronic) bronchitis. HOME CARE INSTRUCTIONS  Get plenty of rest.   Drink enough fluids to keep your urine clear or pale yellow (unless you have a medical condition that requires fluid restriction). Increasing fluids may help thin your secretions and will prevent dehydration.   Only take over-the-counter or prescription medicines as directed by your health care provider.  Only take antibiotics as directed. Make sure you finish them even if you start to feel better.  Avoid secondhand smoke, irritating chemicals, and strong fumes. These will make bronchitis worse. If you are a smoker, quit smoking. Consider using nicotine gum or  skin patches to help control withdrawal symptoms. Quitting smoking will help your lungs heal faster.   Put a cool-mist humidifier in your bedroom at night to moisten the air. This may help loosen mucus. Change the water in the humidifier daily. You can also run the hot water in your shower and sit in the bathroom with the door closed for 5 10 minutes.   Follow up with your health care provider as directed.   Wash your hands frequently to avoid catching bronchitis again or spreading an infection to others.  SEEK MEDICAL CARE IF: Your symptoms do not improve after 1 week of treatment.  SEEK IMMEDIATE MEDICAL CARE IF:  Your fever increases.  You have chills.   You have chest pain.   You have worsening shortness of breath.   You have bloody sputum.  You faint.  You have lightheadedness.  You have a severe headache.   You vomit repeatedly. MAKE SURE YOU:   Understand these instructions.  Will watch your condition.  Will get help right away if you are not doing well or get worse. Document Released: 07/18/2005 Document Revised: 05/08/2013 Document Reviewed: 03/12/2013 [facility] Patient Information 2014 [place].   Take antibiotic as prescribed Increase oral fluids Use humidifier Cough medicine as needed

## 2013-09-16 NOTE — ED Provider Notes (Signed)
CSN: [id number]     Arrival date & time 09/16/13  1030 History   First MD Initiated Contact with Patient 09/16/13 1049     Chief Complaint  Patient presents with  . Cough  . Nasal Congestion     (Consider location/radiation/quality/duration/timing/severity/associated sxs/prior Treatment) Patient is a 73 y.o. male presenting with cough. The history is provided by the patient. No language interpreter was used.  Cough Cough characteristics:  Productive Sputum characteristics:  Nondescript Severity:  Moderate Duration:  6 days Smoker: no   Context: sick contacts   Associated symptoms: fever   Associated symptoms: no chest pain, no chills, no rash, no shortness of breath, no sinus congestion, no sore throat and no wheezing   Risk factors: recent travel    Pt is a 73 year old male who presents with cough and fever. He reports that he has had a cough for approx 6 days. He reports that his symptoms started with nasal drainage and feels like this has moved into his chest. His wife has had similar symptoms and they have recently traveled to see family. He denies any shortness of breath or wheezing.    Past Medical History  Diagnosis Date  . Hypertension   . Diabetes mellitus   . Diverticulosis   . Heart murmur   . COPD (chronic obstructive pulmonary disease)     bronchitis  . Sleep apnea     uses CPAP  . GERD (gastroesophageal reflux disease)    Past Surgical History  Procedure Laterality Date  . Appendectomy    . Ankle surgery    . Nasal polyp excision    . Tee without cardioversion  03/20/2012    Procedure: TRANSESOPHAGEAL ECHOCARDIOGRAM (TEE);  Surgeon: Laverda Page, MD;  Location: [facility] ENDOSCOPY;  Service: Cardiovascular;  Laterality: N/A;   No family history on file. History  Substance Use Topics  . Smoking status: Never Smoker   . Smokeless tobacco: Not on file  . Alcohol Use: Yes    Review of Systems  Constitutional: Positive for fever. Negative for chills.   HENT: Negative for sore throat.   Respiratory: Positive for cough. Negative for shortness of breath and wheezing.   Cardiovascular: Negative for chest pain.  Gastrointestinal: Negative for nausea, vomiting and diarrhea.  Skin: Negative for rash.  Neurological: Negative for weakness.  All other systems reviewed and are negative.      Allergies  Review of patient's allergies indicates no known allergies.  Home Medications   Current Outpatient Rx  Name  Route  Sig  Dispense  Refill  . amLODipine-benazepril (LOTREL) 5-40 MG per capsule   Oral   Take 1 capsule by mouth daily.           . benzonatate (TESSALON) 200 MG capsule   Oral   Take 1 capsule (200 mg total) by mouth 3 (three) times daily as needed for cough.   60 capsule   0   . colesevelam (WELCHOL) 625 MG tablet   Oral   Take 1,875 mg by mouth 2 (two) times daily with a meal.           . dextromethorphan (DELSYM) 30 MG/5ML liquid   Oral   Take 60 mg by mouth 2 (two) times daily as needed. For cough           . donepezil (ARICEPT) 10 MG tablet               . esomeprazole (NEXIUM) 40 MG capsule  Oral   Take 40 mg by mouth daily before breakfast.           . ezetimibe-simvastatin (VYTORIN) 10-80 MG per tablet   Oral   Take 1 tablet by mouth daily.           Marland Kitchen EXPIRED: Fluticasone-Salmeterol (ADVAIR DISKUS) 250-50 MCG/DOSE AEPB   Inhalation   Inhale 1 puff into the lungs 2 (two) times daily. Advair one inhalation every 12 hours; gargle and spit after use   14 each   0   . hydrochlorothiazide (HYDRODIURIL) 25 MG tablet   Oral   Take 12.5 mg by mouth daily.           . insulin glargine (LANTUS SOLOSTAR) 100 UNIT/ML injection   Subcutaneous   Inject 35-38 Units into the skin daily.           . metoprolol tartrate (LOPRESSOR) 25 MG tablet               . mometasone (NASONEX) 50 MCG/ACT nasal spray   Nasal   Place 2 sprays into the nose daily as needed. For allergies and congestion           . Multiple Vitamins-Minerals (MULTIVITAMINS THER. W/MINERALS) TABS   Oral   Take 1 tablet by mouth daily.           . nabumetone (RELAFEN) 750 MG tablet   Oral   Take 750 mg by mouth once as needed. For pain         . ofloxacin (FLOXIN OTIC) 0.3 % otic solution      1- gtts in affected ear qd for 10 days   5 mL   0   . omega-3 acid ethyl esters (LOVAZA) 1 G capsule   Oral   Take 2 g by mouth 2 (two) times daily.           . pioglitazone-metformin (ACTOPLUS MET) 15-850 MG per tablet   Oral   Take 1 tablet by mouth 2 (two) times daily with a meal.           . PRESCRIPTION MEDICATION   Oral   Take 0.5 tablets by mouth daily.           Marland Kitchen ROPINIROLE HCL PO   Oral   Take 1 tablet by mouth daily.            BP 143/63  Pulse 105  Temp(Src) 100.6 F (38.1 C) (Oral)  Resp 20  Ht 5' 9"  (1.753 m)  Wt 230 lb (104.327 kg)  BMI 33.95 kg/m2  SpO2 98% Physical Exam  Nursing note and vitals reviewed. Constitutional: He is oriented to person, place, and time. He appears well-developed and well-nourished. No distress.  HENT:  Right Ear: Tympanic membrane normal.  Left Ear: Tympanic membrane normal.  Mouth/Throat: Uvula is midline, oropharynx is clear and moist and mucous membranes are normal.  Post nasal drip  Eyes: Conjunctivae and EOM are normal.  Neck: Normal range of motion. Neck supple. No JVD present. No tracheal deviation present. No thyromegaly present.  Cardiovascular: Normal rate, regular rhythm and normal heart sounds.   Pulmonary/Chest: Effort normal and breath sounds normal. No accessory muscle usage. Not tachypneic. No respiratory distress.  Productive, wet cough.   Abdominal: Soft. Bowel sounds are normal. He exhibits no distension. There is no tenderness.  Musculoskeletal: Normal range of motion.  Lymphadenopathy:    He has no cervical adenopathy.  Neurological: He is alert and oriented to person,  place, and time.  Skin: Skin is warm and  dry.  Psychiatric: He has a normal mood and affect. His behavior is normal. Judgment and thought content normal.    ED Course  Procedures (including critical care time) Labs Review Labs Reviewed - No data to display Imaging Review No results found.  EKG Interpretation   None       MDM   Final diagnoses:  Bronchitis     Wife with same illness. Pt has had cough/congestion for 5-6 days, will treat due to length of symptoms and fever. He denies any chest pain or shortness of breath, no hypoxia or tachypnea. SPO2 98%. Breathing non-labored. Prescription for Azithromycin and Tessalon given, follow-up with PCP in a couple days. Discussed plan with pt and he agrees. Strict return precautions given. Stable for D/C home.      Elisha Headland, NP 09/22/13 1306

## 2013-09-16 NOTE — ED Notes (Signed)
Patient here with cough and congestion with fever x 6 days, cough worse at night and coughing up some thick mucus, no acute distress

## 2013-09-25 NOTE — ED Provider Notes (Signed)
Medical screening examination/treatment/procedure(s) were conducted as a shared visit with non-physician practitioner(s) and myself.  I personally evaluated the patient during the encounter. Patient is a 73 year old male who presents with complaints of chest congestion and productive cough for the past week. He denies any fevers, chills, chest pain. His wife is ill in a similar fashion.  On exam, vitals are stable and the patient is afebrile. Head is atraumatic, normocephalic. Heart is regular rate and rhythm and lungs are clear. Abdomen is benign. Extremities are without edema.  Patient presents with one week of productive cough which is worsened. He will be treated with Zithromax and when necessary followup.     Veryl Speak, MD 09/25/13 [phone]

## 2013-11-20 ENCOUNTER — Encounter: Payer: Self-pay | Admitting: Neurology

## 2013-11-25 ENCOUNTER — Ambulatory Visit (INDEPENDENT_AMBULATORY_CARE_PROVIDER_SITE_OTHER): Payer: Medicare Other | Admitting: Neurology

## 2013-11-25 ENCOUNTER — Encounter (INDEPENDENT_AMBULATORY_CARE_PROVIDER_SITE_OTHER): Payer: Self-pay

## 2013-11-25 ENCOUNTER — Encounter: Payer: Self-pay | Admitting: Neurology

## 2013-11-25 VITALS — BP 129/67 | HR 75 | Resp 17 | Ht 68.5 in | Wt 230.0 lb

## 2013-11-25 DIAGNOSIS — E1310 Other specified diabetes mellitus with ketoacidosis without coma: Secondary | ICD-10-CM

## 2013-11-25 DIAGNOSIS — I10 Essential (primary) hypertension: Secondary | ICD-10-CM

## 2013-11-25 DIAGNOSIS — E1110 Type 2 diabetes mellitus with ketoacidosis without coma: Secondary | ICD-10-CM

## 2013-11-25 DIAGNOSIS — G4733 Obstructive sleep apnea (adult) (pediatric): Secondary | ICD-10-CM

## 2013-11-25 HISTORY — DX: Type 2 diabetes mellitus with ketoacidosis without coma: E11.10

## 2013-11-25 NOTE — Patient Instructions (Signed)

## 2013-11-25 NOTE — Progress Notes (Addendum)
[facility]  Provider:  [name], M D  Referring Provider: Hendricks Limes, MD Primary Care Physician:  Sheela Stack, MD  EDS on BIPAP, Epworth 16- 18 .     HPI:  Cody Phelps is a 73 y.o.caucasian, married, right handed  male , who is seen here as a referral from Dr. [name] and Dr. 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     [name] was seen in 2010 for a Split -he underwent a polysomnography study on 10-29-08, Resulting in an AHI of 37, supine AHI of 44.8 and completely REM avoid sleep. The obstructive apneas dominated. The patient was first placed on CPAP,which seems to have triggered [facility] breathing he was therefore changed to a BiPAP . On BiPAP he did best at 17/12 cm water - his oxygen nadir rule stool 90% from previously 76% saturation.  The EKG showed a normal sinus rhythm by previously been borderline tachycardia bradycardic. The patient had an initial polysomnogram in 02-08-05 at the [facility] diagnosed with an AHI of 110.3/ hr  and PLM  Arousal index was at 10.1 per hour. He reported not having restorative sleep on CPAP treatment following that PSG.  His BiPAP treatment on the other side refreshed and restored him, he felt well. He has a beard, full facial hair, and uses a nasal pillow. He uses a muscle relaxer every night and Requip for RLS, his wife noticed him having again apneas over the last 3-5 month. No breakthrough snoring on BiPAP. He uses the machine when he takes his afternoon naps.  Bedroom is cool, quiet and dark- and the couple uses a humidifier, which creates some "white noise " in the background. The patient average bedtime as a right 1 AM, he falls asleep promptly he is a restless sleeper, tosses and turns all wakes up quite often he states. He may have one nocturia break. He rises after 8:30 AM. He wakes up spontaneously around 9 AM. He estimated his sleep time at night to  be 7 hours/  He will have late breakfast at home ( 11 AM ), including 1 cups of coffee.  He is not sleepy in the morning hours but after  his lunch time meal or his evening meal , he feels the need to nap. He naps every afternoon, duration about 30 minutes. He falls asleep in his bed, and he sometimes keeps sleeping through the night. His BP is high at baseline - 170 mmHg, and Dr [name] just prescribed new medication. He had been non compliant with his diabetes medications,  Than was was diagnosed with a left carotid bruit.  His machine was not interrogated as no chip card was inserted.       Review of Systems: Out of a complete 14 system review, the patient complains of only the following symptoms, and all other reviewed systems are negative.He had no surgery to  neck, but nasal surgery for polyps and septum deviation.   He endorsed today the fatigue severity score at 39 points, which is elevated. His Epworth sleepiness score was very high at 16/24 possible points. He is not depressed, GDS 2 points.   He has poorly controlled diabetes, HBa1c was 6.8 and he feels that his nocturnal blood sugars are very high or low. He feels "bad".     History   Social History  . Marital Status: Married    Spouse Name: Cody Phelps  Number of Children: 2  . Years of Education: 14   Occupational History  . Not on file.   Social History Main Topics  . Smoking status: Never Smoker   . Smokeless tobacco: Never Used  . Alcohol Use: Yes     Comment: occas.  . Drug Use: No  . Sexual Activity: Not on file   Other Topics Concern  . Not on file   Social History Narrative   Patient is married Cody Phelps).   Patient has two children.   Patient is retired.   Patient has a college education.   Patient is right-handed.   Patient drinks two cups of coffee per day and 2-3 cups of Diet soda daily and limited tea.             Family History  Problem Relation Age of Onset  . Stroke Mother   . Hypertension  Mother   . Heart attack Father   . Heart murmur Father   . Hypertension Sister     Past Medical History  Diagnosis Date  . Hypertension   . Diabetes mellitus   . Diverticulosis   . Heart murmur   . COPD (chronic obstructive pulmonary disease)     bronchitis  . Sleep apnea     uses CPAP  . GERD (gastroesophageal reflux disease)   . Mixed hyperlipidemia   . Aortic valve disorders   . OSA on CPAP     Past Surgical History  Procedure Laterality Date  . Appendectomy  1988  . Ankle surgery    . Nasal polyp excision    . Tee without cardioversion  03/20/2012    Procedure: TRANSESOPHAGEAL ECHOCARDIOGRAM (TEE);  Surgeon: Laverda Page, MD;  Location: [facility];  Service: Cardiovascular;  Laterality: N/A;    Current Outpatient Prescriptions  Medication Sig Dispense Refill  . Amlodipine-Valsartan-HCTZ 10-160-12.5 MG TABS 1 tablet daily.      . colesevelam (WELCHOL) 625 MG tablet Take 625 mg by mouth. Taking 3 tablets two times per day.      . donepezil (ARICEPT) 10 MG tablet       . esomeprazole (NEXIUM) 40 MG capsule Take 40 mg by mouth daily before breakfast.        . ezetimibe-simvastatin (VYTORIN) 10-80 MG per tablet Take 1 tablet by mouth daily.        . insulin glargine (LANTUS SOLOSTAR) 100 UNIT/ML injection Inject 35-38 Units into the skin daily.        . nabumetone (RELAFEN) 750 MG tablet Take 750 mg by mouth once as needed. For pain      . omega-3 acid ethyl esters (LOVAZA) 1 G capsule Take 2 g by mouth 2 (two) times daily.        . Pioglitazone HCl-Metformin HCl (ACTOPLUS MET XR) 30-1000 MG TB24 Take 1 tablet by mouth daily.      Marland Kitchen ROPINIROLE HCL PO Take 1 tablet by mouth daily.         No current facility-administered medications for this visit.    Allergies as of 11/25/2013  . (No Known Allergies)    Vitals: BP 129/67  Pulse 75  Resp 17  Ht 5' 8.5" (1.74 m)  Wt 230 lb (104.327 kg)  BMI 34.46 kg/m2 Last Weight:  Wt Readings from Last 1 Encounters:   11/25/13 230 lb (104.327 kg)   Last Height:   Ht Readings from Last 1 Encounters:  11/25/13 5' 8.5" (1.74 m)   Physical exam:  General: The  patient is awake, alert and appears not in acute distress. The patient is well groomed. Head: Normocephalic, atraumatic. Neck is supple. Mallampati 1 with a deviated uvula to the left.  , neck circumference:19.5 neck inches.  Cardiovascular:  Regular rate and rhythm, with a systolic regurgitation murmur  and a left carotid bruit, clear right carotid,  and without distended neck veins. Respiratory: Lungs are clear to auscultation. Skin:  Without evidence of edema, or rash Trunk: BMI is elevated and patient has normal posture.  Neurologic exam : The patient is awake and alert, oriented to place and time.  Memory subjective  described as intact.  There is a normal attention span & concentration ability. Speech is fluent without  dysarthria, dysphonia or aphasia. Mood and affect are appropriate.  Cranial nerves: Pupils are equal and briskly reactive to light. Funduscopic exam : stats post cataract surgery ,  evidence of pallor ( glaucoma ?), possible retinal bleed on the right  .  Extraocular movements  in vertical and horizontal planes intact but non fatigable  Nystagmus with gaze to the left . Visual fields by finger perimetry are intact. Hearing to finger rub intact.  Facial sensation intact to fine touch. Facial motor strength is symmetric but the left eye is wider then the right, right sided ptosis with fatigue-  and tongue  midline.  Motor exam:  Normal tone , muscle bulk and symmetric normal strength in all extremities.  Sensory:  Fine touch, pinprick and vibration were tested in all extremities.  Proprioception is tested in the upper extremities as normal.   Coordination: Rapid alternating movements in the fingers/hands is tested and normal.  Finger-to-nose maneuver tested and normal without evidence of ataxia, dysmetria or tremor.  Gait and  station: Patient walks without assistive device and is able and assisted stool climb up to the exam table.  Strength within normal limits. Stance is stable and normal. Tandem gait is deferred. Deep tendon reflexes: in the upper  extremities are symmetric and intact. He had a very brisk left patella reflex- Babinski maneuver response is downgoing.   Assessment:  After physical and neurologic examination, review of laboratory studies, imaging, neurophysiology testing and pre-existing records, assessment is   The patient is excessively daytime sleepy , and fatigued , but nor depressed. He has used his BiPAP machine for 5 years and felt for the initial 4.5 years well , but now is sleepier again.  His neurologic findings are suspicious for  a small stroke.  He is obese and not fully compliant with HTN and DM treatment.   Plan:  Treatment plan and additional workup :  Re-evaluation with a re- titration study, just for documentation,  I would like one hour of baseline apnea documented. CO2 for the first hour.  Than retitrate to BIPAP.    Cc  [place], [place], [address].

## 2013-11-25 NOTE — Addendum Note (Signed)
Addended by: [name] on: 11/25/2013 12:04 PM   Modules accepted: Orders

## 2013-12-06 ENCOUNTER — Ambulatory Visit (INDEPENDENT_AMBULATORY_CARE_PROVIDER_SITE_OTHER): Payer: Medicare Other | Admitting: Neurology

## 2013-12-06 DIAGNOSIS — E1110 Type 2 diabetes mellitus with ketoacidosis without coma: Secondary | ICD-10-CM

## 2013-12-06 DIAGNOSIS — I10 Essential (primary) hypertension: Secondary | ICD-10-CM

## 2013-12-06 DIAGNOSIS — G4733 Obstructive sleep apnea (adult) (pediatric): Secondary | ICD-10-CM

## 2013-12-06 DIAGNOSIS — G2581 Restless legs syndrome: Secondary | ICD-10-CM

## 2013-12-09 ENCOUNTER — Other Ambulatory Visit: Payer: Self-pay | Admitting: Neurosurgery

## 2013-12-19 ENCOUNTER — Encounter (HOSPITAL_BASED_OUTPATIENT_CLINIC_OR_DEPARTMENT_OTHER): Payer: Self-pay | Admitting: Emergency Medicine

## 2013-12-19 ENCOUNTER — Emergency Department (HOSPITAL_BASED_OUTPATIENT_CLINIC_OR_DEPARTMENT_OTHER)
Admission: EM | Admit: 2013-12-19 | Discharge: 2013-12-20 | Disposition: A | Discharge status: Home / Self Care | Payer: Medicare Other | Attending: Emergency Medicine | Admitting: Emergency Medicine

## 2013-12-19 DIAGNOSIS — Z79899 Other long term (current) drug therapy: Secondary | ICD-10-CM | POA: Insufficient documentation

## 2013-12-19 DIAGNOSIS — J4489 Other specified chronic obstructive pulmonary disease: Secondary | ICD-10-CM | POA: Insufficient documentation

## 2013-12-19 DIAGNOSIS — I10 Essential (primary) hypertension: Secondary | ICD-10-CM | POA: Insufficient documentation

## 2013-12-19 DIAGNOSIS — E785 Hyperlipidemia, unspecified: Secondary | ICD-10-CM | POA: Insufficient documentation

## 2013-12-19 DIAGNOSIS — Z791 Long term (current) use of non-steroidal anti-inflammatories (NSAID): Secondary | ICD-10-CM | POA: Insufficient documentation

## 2013-12-19 DIAGNOSIS — G4733 Obstructive sleep apnea (adult) (pediatric): Secondary | ICD-10-CM | POA: Insufficient documentation

## 2013-12-19 DIAGNOSIS — Z794 Long term (current) use of insulin: Secondary | ICD-10-CM | POA: Insufficient documentation

## 2013-12-19 DIAGNOSIS — J449 Chronic obstructive pulmonary disease, unspecified: Secondary | ICD-10-CM | POA: Insufficient documentation

## 2013-12-19 DIAGNOSIS — K219 Gastro-esophageal reflux disease without esophagitis: Secondary | ICD-10-CM | POA: Insufficient documentation

## 2013-12-19 DIAGNOSIS — R011 Cardiac murmur, unspecified: Secondary | ICD-10-CM | POA: Insufficient documentation

## 2013-12-19 DIAGNOSIS — Z8719 Personal history of other diseases of the digestive system: Secondary | ICD-10-CM | POA: Insufficient documentation

## 2013-12-19 DIAGNOSIS — E119 Type 2 diabetes mellitus without complications: Secondary | ICD-10-CM | POA: Insufficient documentation

## 2013-12-19 DIAGNOSIS — M5412 Radiculopathy, cervical region: Secondary | ICD-10-CM | POA: Insufficient documentation

## 2013-12-19 DIAGNOSIS — Z8679 Personal history of other diseases of the circulatory system: Secondary | ICD-10-CM | POA: Insufficient documentation

## 2013-12-20 ENCOUNTER — Telehealth: Payer: Self-pay | Admitting: *Deleted

## 2013-12-20 DIAGNOSIS — G4733 Obstructive sleep apnea (adult) (pediatric): Secondary | ICD-10-CM

## 2013-12-20 MED ORDER — CYCLOBENZAPRINE HCL 10 MG PO TABS
5.0000 mg | ORAL_TABLET | Freq: Once | ORAL | Status: AC
  Administered 2013-12-20: 5 mg via ORAL
  Filled 2013-12-20: qty 1

## 2013-12-20 MED ORDER — HYDROCODONE-ACETAMINOPHEN 5-325 MG PO TABS
1.0000 | ORAL_TABLET | Freq: Once | ORAL | Status: AC
  Administered 2013-12-20: 1 via ORAL
  Filled 2013-12-20: qty 1

## 2013-12-20 MED ORDER — CYCLOBENZAPRINE HCL 5 MG PO TABS: 5.0000 mg | ORAL_TABLET | Freq: Three times a day (TID) | ORAL | Status: DC | PRN

## 2013-12-20 MED ORDER — HYDROCODONE-ACETAMINOPHEN 5-325 MG PO TABS: 1.0000 | ORAL_TABLET | Freq: Four times a day (QID) | ORAL | Status: DC | PRN

## 2013-12-20 NOTE — Telephone Encounter (Signed)
Called patient to discuss sleep study results, discussed with wife also.  Discussed findings, recommendations and follow up care.  Patient understood well and all questions were answered.  He currently uses a CPAP which he estimates to be possibly 37-71 years old, it does function but it is time to replace it as it is starting to fail.  We will send his order to his DME, [facility].  He understands the need to call for a follow up appt once the new cpap is in his home so he can schedule a visit for 30 days afterwards - he is scheduled for spinal surgery on 02/12/14, so we may have to adjust somewhat.  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  He is currently taking Requip 1 mg but still having problems.  He may need to discuss an increase in dosage with Dr. Brett Fairy at his follow up, but of course this is all dependent on his ability to see improvements after surgery.

## 2013-12-20 NOTE — ED Provider Notes (Signed)
CSN: [id number]     Arrival date & time 12/19/13  2327 History   First MD Initiated Contact with Patient 12/20/13 0055     Chief Complaint  Patient presents with  . Neck Pain     (Consider location/radiation/quality/duration/timing/severity/associated sxs/prior Treatment) HPI This is a 72 year old male who was working on his motorcycle the evening before last. He in the process he had his neck turned in an awkward position. He is subsequently developed pain in the right upper posterior neck. The pain is sharp and moderate to severe at its worst. He also states it feels like a "charley horse" at times. It radiates downward to his medial upper back and laterally to his right shoulder. It is exacerbated by rotation of his head to the right or left but not with flexion or extension of his neck. It sometimes is exacerbated by movements of his right shoulder. He denies numbness or weakness of the right upper extremity. He is scheduled for back surgery in the near future.  Past Medical History  Diagnosis Date  . Hypertension   . Diabetes mellitus   . Diverticulosis   . Heart murmur   . COPD (chronic obstructive pulmonary disease)     bronchitis  . Sleep apnea     uses CPAP  . GERD (gastroesophageal reflux disease)   . Mixed hyperlipidemia   . Aortic valve disorders   . OSA on CPAP    Past Surgical History  Procedure Laterality Date  . Appendectomy  1988  . Ankle surgery    . Nasal polyp excision    . Tee without cardioversion  03/20/2012    Procedure: TRANSESOPHAGEAL ECHOCARDIOGRAM (TEE);  Surgeon: Laverda Page, MD;  Location: [facility] ENDOSCOPY;  Service: Cardiovascular;  Laterality: N/A;   Family History  Problem Relation Age of Onset  . Stroke Mother   . Hypertension Mother   . Heart attack Father   . Heart murmur Father   . Hypertension Sister    History  Substance Use Topics  . Smoking status: Never Smoker   . Smokeless tobacco: Never Used  . Alcohol Use: Yes     Comment:  occas.    Review of Systems  All other systems reviewed and are negative.  Allergies  Review of patient's allergies indicates no known allergies.  Home Medications   Prior to Admission medications   Medication Sig Start Date End Date Taking? Authorizing Provider  Amlodipine-Valsartan-HCTZ 10-160-12.5 MG TABS 1 tablet daily. 11/07/13   Historical Provider, MD  colesevelam (WELCHOL) 625 MG tablet Take 625 mg by mouth. Taking 3 tablets two times per day.    Historical Provider, MD  donepezil (ARICEPT) 10 MG tablet  03/19/12   Historical Provider, MD  esomeprazole (NEXIUM) 40 MG capsule Take 40 mg by mouth daily before breakfast.      Historical Provider, MD  ezetimibe-simvastatin (VYTORIN) 10-80 MG per tablet Take 1 tablet by mouth daily.      Historical Provider, MD  insulin glargine (LANTUS SOLOSTAR) 100 UNIT/ML injection Inject 35-38 Units into the skin daily.      Historical Provider, MD  nabumetone (RELAFEN) 750 MG tablet Take 750 mg by mouth once as needed. For pain    Historical Provider, MD  omega-3 acid ethyl esters (LOVAZA) 1 G capsule Take 2 g by mouth 2 (two) times daily.      Historical Provider, MD  Pioglitazone HCl-Metformin HCl (ACTOPLUS MET XR) 30-1000 MG TB24 Take 1 tablet by mouth daily.  Historical Provider, MD  ROPINIROLE HCL PO Take 1 tablet by mouth daily.      Historical Provider, MD   BP 159/63  Pulse 74  Temp(Src) 97.9 F (36.6 C) (Oral)  Resp 20  SpO2 98%  Physical Exam General: Well-developed, well-nourished male in no acute distress; appearance consistent with age of record HENT: normocephalic; atraumatic Eyes: Normal appearance Neck: supple; no palpable muscle spasm; pain on rotation of the head to the left or right, no pain on flexion and extension of the neck Heart: regular rate and rhythm Lungs: Normal respiratory effort and excursion Abdomen: soft; nondistended Extremities: No deformity; full range of motion Neurologic: Awake, alert and oriented;  motor function intact in all extremities and symmetric; sensation intact and symmetric in upper extremities; no facial droop Skin: Warm and dry Psychiatric: Normal mood and affect  ED Course  Procedures (including critical care time)  MDM  History and exam consistent with a cervical radiculopathy. He was advised to follow up with Dr. Joya Salm who is scheduled to operate on his back.    Wynetta Fines, MD 12/20/13 [phone]

## 2013-12-20 NOTE — Discharge Instructions (Signed)
Cervical Radiculopathy  Cervical radiculopathy happens when a nerve in the neck is pinched or bruised by a slipped (herniated) disk or by arthritic changes in the bones of the cervical spine. This can occur due to an injury or as part of the normal aging process. Pressure on the cervical nerves can cause pain or numbness that runs from your neck all the way down into your arm and fingers.  CAUSES   There are many possible causes, including:  · Injury.  · Muscle tightness in the neck from overuse.  · Swollen, painful joints (arthritis).  · Breakdown or degeneration in the bones and joints of the spine (spondylosis) due to aging.  · Bone spurs that may develop near the cervical nerves.  SYMPTOMS   Symptoms include pain, weakness, or numbness in the affected arm and hand. Pain can be severe or irritating. Symptoms may be worse when extending or turning the neck.  DIAGNOSIS   Your caregiver will ask about your symptoms and do a physical exam. He or she may test your strength and reflexes. X-rays, CT scans, and MRI scans may be needed in cases of injury or if the symptoms do not go away after a period of time. Electromyography (EMG) or nerve conduction testing may be done to study how your nerves and muscles are working.  TREATMENT   Your caregiver may recommend certain exercises to help relieve your symptoms. Cervical radiculopathy can, and often does, get better with time and treatment. If your problems continue, treatment options may include:  · Wearing a soft collar for short periods of time.  · Physical therapy to strengthen the neck muscles.  · Medicines, such as nonsteroidal anti-inflammatory drugs (NSAIDs), oral corticosteroids, or spinal injections.  · Surgery. Different types of surgery may be done depending on the cause of your problems.  HOME CARE INSTRUCTIONS   · Put ice on the affected area.  · Put ice in a plastic bag.  · Place a towel between your skin and the bag.  · Leave the ice on for 15-20 minutes,  03-04 times a day or as directed by your caregiver.  · If ice does not help, you can try using heat. Take a warm shower or bath, or use a hot water bottle as directed by your caregiver.  · You may try a gentle neck and shoulder massage.  · Use a flat pillow when you sleep.  · Only take over-the-counter or prescription medicines for pain, discomfort, or fever as directed by your caregiver.  · If physical therapy was prescribed, follow your caregiver's directions.  · If a soft collar was prescribed, use it as directed.  SEEK IMMEDIATE MEDICAL CARE IF:   · Your pain gets much worse and cannot be controlled with medicines.  · You have weakness or numbness in your hand, arm, face, or leg.  · You have a high fever or a stiff, rigid neck.  · You lose bowel or bladder control (incontinence).  · You have trouble with walking, balance, or speaking.  MAKE SURE YOU:   · Understand these instructions.  · Will watch your condition.  · Will get help right away if you are not doing well or get worse.  Document Released: 04/12/2001 Document Revised: 10/10/2011 Document Reviewed: 03/01/2011  ExitCare® Patient Information ©2014 [employer].

## 2013-12-26 ENCOUNTER — Encounter: Payer: Self-pay | Admitting: *Deleted

## 2014-01-29 ENCOUNTER — Encounter (HOSPITAL_COMMUNITY): Payer: Self-pay | Admitting: Pharmacy Technician

## 2014-02-03 ENCOUNTER — Encounter (HOSPITAL_COMMUNITY)
Admission: RE | Admit: 2014-02-03 | Discharge: 2014-02-03 | Disposition: A | Discharge status: Home / Self Care | Payer: Medicare Other | Source: Ambulatory Visit | Attending: Anesthesiology | Admitting: Anesthesiology

## 2014-02-03 ENCOUNTER — Encounter (HOSPITAL_COMMUNITY): Payer: Self-pay

## 2014-02-03 ENCOUNTER — Encounter (HOSPITAL_COMMUNITY)
Admission: RE | Admit: 2014-02-03 | Discharge: 2014-02-03 | Disposition: A | Discharge status: Home / Self Care | Payer: Medicare Other | Source: Ambulatory Visit | Attending: Neurosurgery | Admitting: Neurosurgery

## 2014-02-03 DIAGNOSIS — Z01810 Encounter for preprocedural cardiovascular examination: Secondary | ICD-10-CM | POA: Insufficient documentation

## 2014-02-03 DIAGNOSIS — Z01812 Encounter for preprocedural laboratory examination: Secondary | ICD-10-CM | POA: Insufficient documentation

## 2014-02-03 DIAGNOSIS — Z01818 Encounter for other preprocedural examination: Secondary | ICD-10-CM | POA: Insufficient documentation

## 2014-02-03 LAB — CBC
HCT: 37.1 % — ABNORMAL LOW (ref 39.0–52.0)
Hemoglobin: 12.3 g/dL — ABNORMAL LOW (ref 13.0–17.0)
MCH: 29.1 pg (ref 26.0–34.0)
MCHC: 33.2 g/dL (ref 30.0–36.0)
MCV: 87.7 fL (ref 78.0–100.0)
Platelets: 247 10*3/uL (ref 150–400)
RBC: 4.23 MIL/uL (ref 4.22–5.81)
RDW: 13.4 % (ref 11.5–15.5)
WBC: 5.6 10*3/uL (ref 4.0–10.5)

## 2014-02-03 LAB — BASIC METABOLIC PANEL
Anion gap: 15 (ref 5–15)
BUN: 13 mg/dL (ref 6–23)
CO2: 24 mEq/L (ref 19–32)
Calcium: 9.3 mg/dL (ref 8.4–10.5)
Chloride: 102 mEq/L (ref 96–112)
Creatinine, Ser: 0.88 mg/dL (ref 0.50–1.35)
GFR calc Af Amer: >90 mL/min (ref >90)
GFR calc non Af Amer: 84 mL/min — ABNORMAL LOW (ref >90)
Glucose, Bld: 200 mg/dL — ABNORMAL HIGH (ref 70–99)
Potassium: 3.8 mEq/L (ref 3.7–5.3)
Sodium: 141 mEq/L (ref 137–147)

## 2014-02-03 LAB — SURGICAL PCR SCREEN
MRSA, PCR: NEGATIVE
Staphylococcus aureus: POSITIVE — AB

## 2014-02-03 LAB — TYPE AND SCREEN
ABO/RH(D): A POS
Antibody Screen: NEGATIVE

## 2014-02-03 LAB — ABO/RH: ABO/RH(D): A POS

## 2014-02-03 NOTE — Progress Notes (Signed)
I called a prescription to [facility] on [address], [place], [place] for Mupirocin Ointment.

## 2014-02-03 NOTE — Pre-Procedure Instructions (Addendum)
[facility]  02/03/2014   Your procedure is scheduled on:  02/12/14  [facility] short [facility] at 61 AM.  Call this number if you have problems the morning of surgery: [phone]   Remember:   Do not eat food or drink liquids after midnight.   Take these medicines the morning of surgery with A SIP OF WATER: donepezil(aricept),nexium      Take all meds as ordered until day of surgery except as instructed below or per dr      [facility] all herbel meds, nsaids (aleve,naproxen,advil,ibuprofen) 5 days prior to surgery including vitamins,aspirin,fish oil (02/07/14  NO DIABETIC MED OR INSULIN AM OF SURGERY    Do not wear jewelry, make-up or nail polish.  Do not wear lotions, powders, or perfumes. You may wear deodorant.  Do not shave 48 hours prior to surgery. Men may shave face and neck.  Do not bring valuables to the hospital.  [facility] is not responsible                  for any belongings or valuables.               Contacts, dentures or bridgework may not be worn into surgery.  Leave suitcase in the car. After surgery it may be brought to your room.  For patients admitted to the hospital, discharge time is determined by your                treatment team.               Patients discharged the day of surgery will not be allowed to drive  home.  Name and phone number of your driver:   Special Instructions:  Special Instructions: [facility] - Preparing for Surgery  Before surgery, you can play an important role.  Because skin is not sterile, your skin needs to be as free of germs as possible.  You can reduce the number of germs on you skin by washing with CHG (chlorahexidine gluconate) soap before surgery.  CHG is an antiseptic cleaner which kills germs and bonds with the skin to continue killing germs even after washing.  Please DO NOT use if you have an allergy to CHG or antibacterial soaps.  If your skin becomes reddened/irritated stop using the CHG and inform your  nurse when you arrive at Short Stay.  Do not shave (including legs and underarms) for at least 48 hours prior to the first CHG shower.  You may shave your face.  Please follow these instructions carefully:   1.  Shower with CHG Soap the night before surgery and the morning of Surgery.  2.  If you choose to wash your hair, wash your hair first as usual with your normal shampoo.  3.  After you shampoo, rinse your hair and body thoroughly to remove the Shampoo.  4.  Use CHG as you would any other liquid soap.  You can apply chg directly  to the skin and wash gently with scrungie or a clean washcloth.  5.  Apply the CHG Soap to your body ONLY FROM THE NECK DOWN.  Do not use on open wounds or open sores.  Avoid contact with your eyes ears, mouth and genitals (private parts).  Wash genitals (private parts)       with your normal soap.  6.  Wash thoroughly, paying special attention to the area where your surgery will be performed.  7.  Thoroughly  rinse your body with warm water from the neck down.  8.  DO NOT shower/wash with your normal soap after using and rinsing off the CHG Soap.  9.  Pat yourself dry with a clean towel.            10.  Wear clean pajamas.            11.  Place clean sheets on your bed the night of your first shower and do not sleep with pets.  Day of Surgery  Do not apply any lotions/deodorants the morning of surgery.  Please wear clean clothes to the hospital/surgery center.   Please read over the following fact sheets that you were given: Pain Booklet, Coughing and Deep Breathing, Blood Transfusion Information, MRSA Information and Surgical Site Infection Prevention

## 2014-02-04 ENCOUNTER — Encounter (HOSPITAL_COMMUNITY): Payer: Self-pay

## 2014-02-04 NOTE — Progress Notes (Signed)
Anesthesia Chart Review:  Patient is a 73 year old male scheduled for L2-3, L3-4 laminectomy, foraminectomy, possible posterolateral arthrodesis with autograft on 02/12/14 by Dr. Joya Salm.  History includes former smoker, OSA with CPAP, COPD/bronchitis, GERD, HTN, HLD, DM2, aortic stenosis. BMI is consistent with obesity.  PCP is Cardiologist is Dr. [name].  He saw patient on 12/19/13 for follow-up and preoperative evaluation. He felt patient would be acceptable CV risk as long as his follow-up echo did not show progression to severe AS--12/26/13 showed stable mild AS.  PCP is Dr. Forde Dandy.  EKG on 02/03/14 showed NSR, ST/T wave abnormality, consider lateral ischemia.  EKG appears stable when compared to tracing from 11/06/13 (Dr. [name]).  Echo on 12/26/13 (Dr. [name]) showed: Normal LV size. Mild concentric LVH. Normal global LV wall motion and systolic function, EF 68%. Grade 1 diastolic dysfunction with elevated LV filling pressures.  LA cavity mild to moderately dilated. Tricuspid AV with mild AR., moderate AV leaflet thickening with mild calcification. Mildly restricted AV leaflets. Mild AS with peak gradient of 25, mean gradient of 14 mmHg.  Calculated valve area of 1.56 cm2. No significant change in severity of AS since 2013. Mild MR. Moderate calcification of the MV annulus. Normal TV with trace TR.  Carotid duplex on 11/11/13 (Dr. [name]) showed: No significant stenosis of right carotid bifurcation vessels.  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 Hypertensive. Normal perfusion. Low risk." (Report requested.)  CXR on 02/03/14 showed: Bibasilar subsegmental atelectasis and/or scarring. No focal abnormality otherwise noted.  Preoperative labs noted.  He will get a fasting CBG on arrival.   Patient's recent echo showed continued mild AS.  Based on Dr. Irven Shelling last office note,  patient should be acceptable to proceed with surgery from his standpoint. If no acute changes then I would anticipate that he could proceed as planned.  George Hugh [facility] Short Stay Center/Anesthesiology Phone [phone] 02/04/2014 6:18 PM

## 2014-02-11 MED ORDER — CEFAZOLIN SODIUM-DEXTROSE 2-3 GM-% IV SOLR
2.0000 g | INTRAVENOUS | Status: AC
  Administered 2014-02-12: 2 g via INTRAVENOUS
  Filled 2014-02-11: qty 50

## 2014-02-11 NOTE — H&P (Signed)
[facility] is an 73 y.o. male.   Chief Complaint: LOWER BACK PAIN HPI: PATIENT WITH LOWER BACK pain with radiation to both lower extremities up to the poin he lost control of both legs and fell ,braking his nose.lately he has noticed that his left leg is getting smallerwith quite weakness of the left foot.  Past Medical History  Diagnosis Date  . Hypertension   . Diabetes mellitus   . Diverticulosis   . Heart murmur   . COPD (chronic obstructive pulmonary disease)     bronchitis  . Sleep apnea     uses CPAP  . GERD (gastroesophageal reflux disease)   . Mixed hyperlipidemia   . Aortic valve disorders     mild AS/AR 12/26/13 echo (Dr. [name])  . OSA on CPAP     Past Surgical History  Procedure Laterality Date  . Appendectomy  1988  . Ankle surgery Right   . Nasal polyp excision    . Tee without cardioversion  03/20/2012    Procedure: TRANSESOPHAGEAL ECHOCARDIOGRAM (TEE);  Surgeon: Laverda Page, MD;  Location: [facility] ENDOSCOPY;  Service: Cardiovascular;  Laterality: N/A;    Family History  Problem Relation Age of Onset  . Stroke Mother   . Hypertension Mother   . Heart attack Father   . Heart murmur Father   . Hypertension Sister    Social History:  reports that he quit smoking about 47 years ago. His smoking use included Cigarettes. He smoked 0.00 packs per day for 1 year. He has never used smokeless tobacco. He reports that he drinks alcohol. He reports that he does not use illicit drugs.  Allergies: No Known Allergies  No prescriptions prior to admission    No results found for this or any previous visit (from the past 48 hour(s)). No results found.  Review of Systems  Constitutional: Negative.   HENT: Negative.   Eyes: Negative.   Respiratory: Negative.   Cardiovascular:       Arterial hypertension  Gastrointestinal: Negative.   Genitourinary: Negative.   Musculoskeletal: Positive for back pain.  Skin: Negative.   Neurological: Positive for sensory change  and focal weakness.  Endo/Heme/Allergies:       DM  Psychiatric/Behavioral: Negative.     There were no vitals taken for this visit. Physical Exam hent, nl. Neck, nl. Cv, nl. Lungs,clear. Abdomen, soft. Extremities, nl. NEURO Weakness of left DF, PF and the left calf smaller than the right. Dtr, nl. straight leg-raise positive at 45 degrees . Mri shows severe stenosis at l2-3, 3-4.  With ddd at multiple levels.  Assessment/Plan The plan is to decompress from l2 to l5 with PLA using his bone. The risks and benefits were fully explained  To him and his wife  [name] 02/11/2014, 4:53 PM

## 2014-02-12 ENCOUNTER — Inpatient Hospital Stay (HOSPITAL_COMMUNITY)
Admission: RE | Admit: 2014-02-12 | Discharge: 2014-02-17 | DRG: 458 | Disposition: A | Discharge status: Home / Self Care | Payer: Medicare Other | Source: Ambulatory Visit | Attending: Neurosurgery | Admitting: Neurosurgery

## 2014-02-12 ENCOUNTER — Encounter (HOSPITAL_COMMUNITY): Payer: Self-pay | Admitting: Anesthesiology

## 2014-02-12 ENCOUNTER — Encounter (HOSPITAL_COMMUNITY)
Admission: RE | Disposition: A | Discharge status: Home / Self Care | Payer: Self-pay | Source: Ambulatory Visit | Attending: Neurosurgery

## 2014-02-12 ENCOUNTER — Inpatient Hospital Stay (HOSPITAL_COMMUNITY): Payer: Medicare Other | Admitting: Anesthesiology

## 2014-02-12 ENCOUNTER — Encounter (HOSPITAL_COMMUNITY): Payer: Medicare Other | Admitting: Vascular Surgery

## 2014-02-12 ENCOUNTER — Inpatient Hospital Stay (HOSPITAL_COMMUNITY): Payer: Medicare Other

## 2014-02-12 DIAGNOSIS — Z794 Long term (current) use of insulin: Secondary | ICD-10-CM | POA: Diagnosis not present

## 2014-02-12 DIAGNOSIS — J449 Chronic obstructive pulmonary disease, unspecified: Secondary | ICD-10-CM | POA: Diagnosis present

## 2014-02-12 DIAGNOSIS — M51379 Other intervertebral disc degeneration, lumbosacral region without mention of lumbar back pain or lower extremity pain: Secondary | ICD-10-CM | POA: Diagnosis present

## 2014-02-12 DIAGNOSIS — Z87891 Personal history of nicotine dependence: Secondary | ICD-10-CM

## 2014-02-12 DIAGNOSIS — G4733 Obstructive sleep apnea (adult) (pediatric): Secondary | ICD-10-CM | POA: Diagnosis present

## 2014-02-12 DIAGNOSIS — M4120 Other idiopathic scoliosis, site unspecified: Secondary | ICD-10-CM | POA: Diagnosis present

## 2014-02-12 DIAGNOSIS — E119 Type 2 diabetes mellitus without complications: Secondary | ICD-10-CM | POA: Diagnosis present

## 2014-02-12 DIAGNOSIS — M48062 Spinal stenosis, lumbar region with neurogenic claudication: Secondary | ICD-10-CM | POA: Diagnosis present

## 2014-02-12 DIAGNOSIS — M7130 Other bursal cyst, unspecified site: Secondary | ICD-10-CM | POA: Diagnosis present

## 2014-02-12 DIAGNOSIS — I359 Nonrheumatic aortic valve disorder, unspecified: Secondary | ICD-10-CM | POA: Diagnosis present

## 2014-02-12 DIAGNOSIS — M5137 Other intervertebral disc degeneration, lumbosacral region: Secondary | ICD-10-CM | POA: Diagnosis present

## 2014-02-12 DIAGNOSIS — M545 Low back pain, unspecified: Secondary | ICD-10-CM | POA: Diagnosis present

## 2014-02-12 DIAGNOSIS — K219 Gastro-esophageal reflux disease without esophagitis: Secondary | ICD-10-CM | POA: Diagnosis present

## 2014-02-12 DIAGNOSIS — Z7982 Long term (current) use of aspirin: Secondary | ICD-10-CM | POA: Diagnosis not present

## 2014-02-12 DIAGNOSIS — J4489 Other specified chronic obstructive pulmonary disease: Secondary | ICD-10-CM | POA: Diagnosis present

## 2014-02-12 DIAGNOSIS — Z79899 Other long term (current) drug therapy: Secondary | ICD-10-CM | POA: Diagnosis not present

## 2014-02-12 DIAGNOSIS — I10 Essential (primary) hypertension: Secondary | ICD-10-CM | POA: Diagnosis present

## 2014-02-12 DIAGNOSIS — E782 Mixed hyperlipidemia: Secondary | ICD-10-CM | POA: Diagnosis present

## 2014-02-12 HISTORY — PX: LAMINECTOMY WITH POSTERIOR LATERAL ARTHRODESIS LEVEL 2: SHX6336

## 2014-02-12 LAB — GLUCOSE, CAPILLARY
Glucose-Capillary: 96 mg/dL (ref 70–99)
Glucose-Capillary: 113 mg/dL — ABNORMAL HIGH (ref 70–99)
Glucose-Capillary: 100 mg/dL — ABNORMAL HIGH (ref 70–99)
Glucose-Capillary: 118 mg/dL — ABNORMAL HIGH (ref 70–99)

## 2014-02-12 SURGERY — LAMINECTOMY WITH POSTERIOR LATERAL ARTHRODESIS LEVEL 2
Anesthesia: General | Site: Spine Lumbar

## 2014-02-12 MED ORDER — PROPOFOL 10 MG/ML IV BOLUS
INTRAVENOUS | Status: AC
  Filled 2014-02-12: qty 20

## 2014-02-12 MED ORDER — PHENYLEPHRINE HCL 10 MG/ML IJ SOLN
INTRAMUSCULAR | Status: DC | PRN
  Administered 2014-02-12 (×4): 40 ug via INTRAVENOUS

## 2014-02-12 MED ORDER — LIDOCAINE HCL (CARDIAC) 20 MG/ML IV SOLN
INTRAVENOUS | Status: AC
  Filled 2014-02-12: qty 5

## 2014-02-12 MED ORDER — ROCURONIUM BROMIDE 50 MG/5ML IV SOLN
INTRAVENOUS | Status: AC
  Filled 2014-02-12: qty 1

## 2014-02-12 MED ORDER — SUCCINYLCHOLINE CHLORIDE 20 MG/ML IJ SOLN
INTRAMUSCULAR | Status: DC | PRN
  Administered 2014-02-12: 100 mg via INTRAVENOUS

## 2014-02-12 MED ORDER — PHENOL 1.4 % MT LIQD: 1.0000 | OROMUCOSAL | Status: DC | PRN

## 2014-02-12 MED ORDER — EPHEDRINE SULFATE 50 MG/ML IJ SOLN
INTRAMUSCULAR | Status: AC
  Filled 2014-02-12: qty 1

## 2014-02-12 MED ORDER — ROPINIROLE HCL 1 MG PO TABS
1.0000 mg | ORAL_TABLET | Freq: Every day | ORAL | Status: DC
  Administered 2014-02-12 – 2014-02-15 (×5): 1 mg via ORAL
  Filled 2014-02-12 (×6): qty 1

## 2014-02-12 MED ORDER — FENTANYL CITRATE 0.05 MG/ML IJ SOLN
INTRAMUSCULAR | Status: AC
  Filled 2014-02-12: qty 5

## 2014-02-12 MED ORDER — ARTIFICIAL TEARS OP OINT
TOPICAL_OINTMENT | OPHTHALMIC | Status: AC
  Filled 2014-02-12: qty 3.5

## 2014-02-12 MED ORDER — ACETAMINOPHEN 650 MG RE SUPP: 650.0000 mg | RECTAL | Status: DC | PRN

## 2014-02-12 MED ORDER — AMLODIPINE BESYLATE 10 MG PO TABS
10.0000 mg | ORAL_TABLET | Freq: Every day | ORAL | Status: DC
  Administered 2014-02-12 – 2014-02-17 (×6): 10 mg via ORAL
  Filled 2014-02-12 (×6): qty 1

## 2014-02-12 MED ORDER — SODIUM CHLORIDE 0.9 % IJ SOLN
3.0000 mL | Freq: Two times a day (BID) | INTRAMUSCULAR | Status: DC
  Administered 2014-02-13 – 2014-02-14 (×3): 3 mL via INTRAVENOUS

## 2014-02-12 MED ORDER — METFORMIN HCL ER 500 MG PO TB24
1000.0000 mg | ORAL_TABLET | Freq: Every day | ORAL | Status: DC
  Administered 2014-02-13 – 2014-02-17 (×5): 1000 mg via ORAL
  Filled 2014-02-12 (×5): qty 2

## 2014-02-12 MED ORDER — PANTOPRAZOLE SODIUM 40 MG PO PACK
40.0000 mg | PACK | Freq: Every day | ORAL | Status: DC
  Administered 2014-02-13 – 2014-02-15 (×3): 40 mg via ORAL
  Filled 2014-02-12 (×6): qty 20

## 2014-02-12 MED ORDER — SUCCINYLCHOLINE CHLORIDE 20 MG/ML IJ SOLN
INTRAMUSCULAR | Status: AC
  Filled 2014-02-12: qty 1

## 2014-02-12 MED ORDER — PIOGLITAZONE HCL 30 MG PO TABS
30.0000 mg | ORAL_TABLET | Freq: Every day | ORAL | Status: DC
  Administered 2014-02-13 – 2014-02-16 (×4): 30 mg via ORAL
  Filled 2014-02-12 (×6): qty 1

## 2014-02-12 MED ORDER — ONDANSETRON HCL 4 MG/2ML IJ SOLN
INTRAMUSCULAR | Status: DC | PRN
  Administered 2014-02-12: 4 mg via INTRAVENOUS

## 2014-02-12 MED ORDER — DONEPEZIL HCL 10 MG PO TABS
10.0000 mg | ORAL_TABLET | Freq: Every day | ORAL | Status: DC
  Administered 2014-02-13 – 2014-02-17 (×5): 10 mg via ORAL
  Filled 2014-02-12 (×6): qty 1

## 2014-02-12 MED ORDER — LACTATED RINGERS IV SOLN
INTRAVENOUS | Status: DC | PRN
  Administered 2014-02-12 (×2): via INTRAVENOUS

## 2014-02-12 MED ORDER — LIDOCAINE HCL (CARDIAC) 20 MG/ML IV SOLN
INTRAVENOUS | Status: DC | PRN
Start: 2014-02-12 — End: 2014-02-12
  Administered 2014-02-12: 80 mg via INTRAVENOUS

## 2014-02-12 MED ORDER — MIDAZOLAM HCL 2 MG/2ML IJ SOLN
INTRAMUSCULAR | Status: AC
  Filled 2014-02-12: qty 2

## 2014-02-12 MED ORDER — ROCURONIUM BROMIDE 100 MG/10ML IV SOLN
INTRAVENOUS | Status: DC | PRN
  Administered 2014-02-12: 50 mg via INTRAVENOUS

## 2014-02-12 MED ORDER — GLYCOPYRROLATE 0.2 MG/ML IJ SOLN
INTRAMUSCULAR | Status: AC
  Filled 2014-02-12: qty 3

## 2014-02-12 MED ORDER — ARTIFICIAL TEARS OP OINT
TOPICAL_OINTMENT | OPHTHALMIC | Status: DC | PRN
  Administered 2014-02-12: 1 via OPHTHALMIC

## 2014-02-12 MED ORDER — DIAZEPAM 5 MG PO TABS
5.0000 mg | ORAL_TABLET | Freq: Four times a day (QID) | ORAL | Status: DC | PRN
  Administered 2014-02-13 – 2014-02-16 (×5): 5 mg via ORAL
  Filled 2014-02-12 (×6): qty 1

## 2014-02-12 MED ORDER — FENTANYL CITRATE 0.05 MG/ML IJ SOLN
INTRAMUSCULAR | Status: DC | PRN
  Administered 2014-02-12 (×5): 50 ug via INTRAVENOUS

## 2014-02-12 MED ORDER — OXYCODONE-ACETAMINOPHEN 5-325 MG PO TABS
1.0000 | ORAL_TABLET | ORAL | Status: DC | PRN
  Administered 2014-02-12 – 2014-02-13 (×5): 2 via ORAL
  Administered 2014-02-13 – 2014-02-14 (×2): 1 via ORAL
  Filled 2014-02-12: qty 2
  Filled 2014-02-12: qty 1
  Filled 2014-02-12 (×2): qty 2
  Filled 2014-02-12: qty 1
  Filled 2014-02-12 (×2): qty 2

## 2014-02-12 MED ORDER — PROMETHAZINE HCL 25 MG/ML IJ SOLN: 6.2500 mg | INTRAMUSCULAR | Status: DC | PRN

## 2014-02-12 MED ORDER — AMLODIPINE-VALSARTAN-HCTZ 10-160-12.5 MG PO TABS: 1.0000 | ORAL_TABLET | Freq: Every day | ORAL | Status: DC

## 2014-02-12 MED ORDER — THROMBIN 5000 UNITS EX SOLR
CUTANEOUS | Status: DC | PRN
  Administered 2014-02-12 (×2): 5000 [IU] via TOPICAL

## 2014-02-12 MED ORDER — PHENYLEPHRINE 40 MCG/ML (10ML) SYRINGE FOR IV PUSH (FOR BLOOD PRESSURE SUPPORT)
PREFILLED_SYRINGE | INTRAVENOUS | Status: AC
  Filled 2014-02-12: qty 10

## 2014-02-12 MED ORDER — DEXTROSE 5 % IV SOLN
10.0000 mg | INTRAVENOUS | Status: DC | PRN
  Administered 2014-02-12: 25 ug/min via INTRAVENOUS

## 2014-02-12 MED ORDER — 0.9 % SODIUM CHLORIDE (POUR BTL) OPTIME
TOPICAL | Status: DC | PRN
  Administered 2014-02-12: 1000 mL

## 2014-02-12 MED ORDER — MORPHINE SULFATE 2 MG/ML IJ SOLN: 1.0000 mg | INTRAMUSCULAR | Status: DC | PRN

## 2014-02-12 MED ORDER — PHENYLEPHRINE HCL 10 MG/ML IJ SOLN: 10.0000 mg | INTRAVENOUS | Status: DC | PRN

## 2014-02-12 MED ORDER — INSULIN GLARGINE 100 UNIT/ML ~~LOC~~ SOLN
40.0000 [IU] | Freq: Every day | SUBCUTANEOUS | Status: DC
  Administered 2014-02-13 – 2014-02-16 (×3): 40 [IU] via SUBCUTANEOUS
  Filled 2014-02-12 (×6): qty 0.4

## 2014-02-12 MED ORDER — HYDROCHLOROTHIAZIDE 12.5 MG PO CAPS
12.5000 mg | ORAL_CAPSULE | Freq: Every day | ORAL | Status: DC
  Administered 2014-02-12 – 2014-02-17 (×6): 12.5 mg via ORAL
  Filled 2014-02-12 (×6): qty 1

## 2014-02-12 MED ORDER — SODIUM CHLORIDE 0.9 % IV SOLN
INTRAVENOUS | Status: DC
  Administered 2014-02-12: 23:00:00 via INTRAVENOUS

## 2014-02-12 MED ORDER — EZETIMIBE-SIMVASTATIN 10-80 MG PO TABS
1.0000 | ORAL_TABLET | Freq: Every day | ORAL | Status: DC
  Administered 2014-02-12: 1 via ORAL
  Filled 2014-02-12 (×3): qty 1

## 2014-02-12 MED ORDER — PIOGLITAZONE HCL-METFORMIN ER 30-1000 MG PO TB24: 1.0000 | ORAL_TABLET | Freq: Every day | ORAL | Status: DC

## 2014-02-12 MED ORDER — PHENYLEPHRINE HCL 10 MG/ML IJ SOLN
INTRAMUSCULAR | Status: AC
  Filled 2014-02-12: qty 1

## 2014-02-12 MED ORDER — MIDAZOLAM HCL 5 MG/5ML IJ SOLN
INTRAMUSCULAR | Status: DC | PRN
  Administered 2014-02-12 (×2): 1 mg via INTRAVENOUS

## 2014-02-12 MED ORDER — COLESEVELAM HCL 625 MG PO TABS
1875.0000 mg | ORAL_TABLET | Freq: Two times a day (BID) | ORAL | Status: DC
  Administered 2014-02-12 – 2014-02-16 (×7): 1875 mg via ORAL
  Filled 2014-02-12 (×13): qty 3

## 2014-02-12 MED ORDER — STERILE WATER FOR INJECTION IJ SOLN
INTRAMUSCULAR | Status: AC
  Filled 2014-02-12: qty 10

## 2014-02-12 MED ORDER — SODIUM CHLORIDE 0.9 % IV SOLN: 250.0000 mL | INTRAVENOUS | Status: DC

## 2014-02-12 MED ORDER — OXYCODONE HCL 5 MG/5ML PO SOLN: 5.0000 mg | Freq: Once | ORAL | Status: DC | PRN

## 2014-02-12 MED ORDER — ONDANSETRON HCL 4 MG/2ML IJ SOLN
INTRAMUSCULAR | Status: AC
  Filled 2014-02-12: qty 2

## 2014-02-12 MED ORDER — SODIUM CHLORIDE 0.9 % IJ SOLN: 3.0000 mL | INTRAMUSCULAR | Status: DC | PRN

## 2014-02-12 MED ORDER — IRBESARTAN 150 MG PO TABS
150.0000 mg | ORAL_TABLET | Freq: Every day | ORAL | Status: DC
  Administered 2014-02-12 – 2014-02-17 (×6): 150 mg via ORAL
  Filled 2014-02-12 (×6): qty 1

## 2014-02-12 MED ORDER — HEMOSTATIC AGENTS (NO CHARGE) OPTIME
TOPICAL | Status: DC | PRN
  Administered 2014-02-12: 1 via TOPICAL

## 2014-02-12 MED ORDER — CEFAZOLIN SODIUM 1-5 GM-% IV SOLN
1.0000 g | Freq: Three times a day (TID) | INTRAVENOUS | Status: AC
  Administered 2014-02-12 (×2): 1 g via INTRAVENOUS
  Filled 2014-02-12 (×2): qty 50

## 2014-02-12 MED ORDER — GLYCOPYRROLATE 0.2 MG/ML IJ SOLN
INTRAMUSCULAR | Status: DC | PRN
  Administered 2014-02-12: 0.4 mg via INTRAVENOUS

## 2014-02-12 MED ORDER — ONDANSETRON HCL 4 MG/2ML IJ SOLN: 4.0000 mg | INTRAMUSCULAR | Status: DC | PRN

## 2014-02-12 MED ORDER — NEOSTIGMINE METHYLSULFATE 10 MG/10ML IV SOLN
INTRAVENOUS | Status: DC | PRN
  Administered 2014-02-12: 3 mg via INTRAVENOUS

## 2014-02-12 MED ORDER — HYDROMORPHONE HCL PF 1 MG/ML IJ SOLN
INTRAMUSCULAR | Status: AC
  Filled 2014-02-12: qty 1

## 2014-02-12 MED ORDER — PROPOFOL 10 MG/ML IV BOLUS
INTRAVENOUS | Status: DC | PRN
  Administered 2014-02-12: 150 mg via INTRAVENOUS

## 2014-02-12 MED ORDER — OXYCODONE HCL 5 MG PO TABS: 5.0000 mg | ORAL_TABLET | Freq: Once | ORAL | Status: DC | PRN

## 2014-02-12 MED ORDER — NEOSTIGMINE METHYLSULFATE 10 MG/10ML IV SOLN
INTRAVENOUS | Status: AC
  Filled 2014-02-12: qty 1

## 2014-02-12 MED ORDER — MENTHOL 3 MG MT LOZG: 1.0000 | LOZENGE | OROMUCOSAL | Status: DC | PRN

## 2014-02-12 MED ORDER — ACETAMINOPHEN 325 MG PO TABS
650.0000 mg | ORAL_TABLET | ORAL | Status: DC | PRN
  Administered 2014-02-14 – 2014-02-15 (×2): 650 mg via ORAL
  Filled 2014-02-12 (×2): qty 2

## 2014-02-12 MED ORDER — HYDROMORPHONE HCL PF 1 MG/ML IJ SOLN
0.2500 mg | INTRAMUSCULAR | Status: DC | PRN
  Administered 2014-02-12 (×2): 0.5 mg via INTRAVENOUS

## 2014-02-12 SURGICAL SUPPLY — 70 items
APL SKNCLS STERI-STRIP NONHPOA (GAUZE/BANDAGES/DRESSINGS) ×1
BENZOIN TINCTURE PRP APPL 2/3 (GAUZE/BANDAGES/DRESSINGS) ×3 IMPLANT
BLADE 10 SAFETY STRL DISP (BLADE) ×1 IMPLANT
BLADE SURG ROTATE 9660 (MISCELLANEOUS) ×2 IMPLANT
BUR ACORN 6.0 (BURR) ×1 IMPLANT
BUR ACORN 6.0MM (BURR) ×1
BUR MATCHSTICK NEURO 3.0 LAGG (BURR) ×5 IMPLANT
CANISTER SUCT 3000ML (MISCELLANEOUS) ×3 IMPLANT
CLOSURE WOUND 1/2 X4 (GAUZE/BANDAGES/DRESSINGS) ×1
CONT SPEC 4OZ CLIKSEAL STRL BL (MISCELLANEOUS) ×5 IMPLANT
DRAPE LAPAROTOMY 100X72X124 (DRAPES) ×3 IMPLANT
DRAPE MICROSCOPE LEICA (MISCELLANEOUS) ×1 IMPLANT
DRAPE POUCH INSTRU U-SHP 10X18 (DRAPES) ×3 IMPLANT
DRSG OPSITE POSTOP 4X8 (GAUZE/BANDAGES/DRESSINGS) ×2 IMPLANT
DURAPREP 26ML APPLICATOR (WOUND CARE) ×3 IMPLANT
ELECT BLADE 4.0 EZ CLEAN MEGAD (MISCELLANEOUS) ×3
ELECT REM PT RETURN 9FT ADLT (ELECTROSURGICAL) ×3
ELECTRODE BLDE 4.0 EZ CLN MEGD (MISCELLANEOUS) IMPLANT
ELECTRODE REM PT RTRN 9FT ADLT (ELECTROSURGICAL) ×1 IMPLANT
EVACUATOR 3/16  PVC DRAIN (DRAIN) ×2
EVACUATOR 3/16 PVC DRAIN (DRAIN) IMPLANT
GAUZE SPONGE 4X4 16PLY XRAY LF (GAUZE/BANDAGES/DRESSINGS) IMPLANT
GLOVE BIO SURGEON STRL SZ8 (GLOVE) ×2 IMPLANT
GLOVE BIOGEL M 8.0 STRL (GLOVE) ×3 IMPLANT
GLOVE BIOGEL PI IND STRL 8 (GLOVE) IMPLANT
GLOVE BIOGEL PI IND STRL 8.5 (GLOVE) IMPLANT
GLOVE BIOGEL PI INDICATOR 8 (GLOVE) ×4
GLOVE BIOGEL PI INDICATOR 8.5 (GLOVE) ×2
GLOVE ECLIPSE 7.5 STRL STRAW (GLOVE) ×6 IMPLANT
GLOVE EXAM NITRILE LRG STRL (GLOVE) IMPLANT
GLOVE EXAM NITRILE MD LF STRL (GLOVE) IMPLANT
GLOVE EXAM NITRILE XL STR (GLOVE) IMPLANT
GLOVE EXAM NITRILE XS STR PU (GLOVE) IMPLANT
GLOVE SURG SS PI 7.0 STRL IVOR (GLOVE) ×2 IMPLANT
GOWN STRL REUS W/ TWL LRG LVL3 (GOWN DISPOSABLE) ×1 IMPLANT
GOWN STRL REUS W/ TWL XL LVL3 (GOWN DISPOSABLE) IMPLANT
GOWN STRL REUS W/TWL 2XL LVL3 (GOWN DISPOSABLE) ×2 IMPLANT
GOWN STRL REUS W/TWL LRG LVL3 (GOWN DISPOSABLE) ×3
GOWN STRL REUS W/TWL XL LVL3 (GOWN DISPOSABLE) ×3
KIT BASIN OR (CUSTOM PROCEDURE TRAY) ×3 IMPLANT
KIT ROOM TURNOVER OR (KITS) ×3 IMPLANT
MILL MEDIUM DISP (BLADE) ×2 IMPLANT
NDL HYPO 18GX1.5 BLUNT FILL (NEEDLE) IMPLANT
NDL HYPO 21X1.5 SAFETY (NEEDLE) IMPLANT
NDL SPNL 20GX3.5 QUINCKE YW (NEEDLE) IMPLANT
NEEDLE HYPO 18GX1.5 BLUNT FILL (NEEDLE) IMPLANT
NEEDLE HYPO 21X1.5 SAFETY (NEEDLE) IMPLANT
NEEDLE HYPO 25X1 1.5 SAFETY (NEEDLE) ×3 IMPLANT
NEEDLE SPNL 20GX3.5 QUINCKE YW (NEEDLE) ×3 IMPLANT
NS IRRIG 1000ML POUR BTL (IV SOLUTION) ×3 IMPLANT
PACK FOAM VITOSS 10CC (Orthopedic Implant) ×2 IMPLANT
PACK LAMINECTOMY NEURO (CUSTOM PROCEDURE TRAY) ×3 IMPLANT
PAD ABD 8X10 STRL (GAUZE/BANDAGES/DRESSINGS) IMPLANT
PAD ARMBOARD 7.5X6 YLW CONV (MISCELLANEOUS) ×15 IMPLANT
PATTIES SURGICAL .5 X1 (DISPOSABLE) ×1 IMPLANT
RUBBERBAND STERILE (MISCELLANEOUS) ×2 IMPLANT
SPONGE GAUZE 4X4 12PLY (GAUZE/BANDAGES/DRESSINGS) ×3 IMPLANT
SPONGE LAP 4X18 X RAY DECT (DISPOSABLE) IMPLANT
SPONGE SURGIFOAM ABS GEL SZ50 (HEMOSTASIS) ×3 IMPLANT
STRIP CLOSURE SKIN 1/2X4 (GAUZE/BANDAGES/DRESSINGS) ×2 IMPLANT
SUT VIC AB 0 CT1 18XCR BRD8 (SUTURE) ×1 IMPLANT
SUT VIC AB 0 CT1 8-18 (SUTURE) ×6
SUT VIC AB 2-0 CP2 18 (SUTURE) ×3 IMPLANT
SUT VIC AB 3-0 SH 8-18 (SUTURE) ×3 IMPLANT
SYR 20CC LL (SYRINGE) IMPLANT
SYR 20ML ECCENTRIC (SYRINGE) ×3 IMPLANT
SYR 5ML LL (SYRINGE) IMPLANT
TOWEL OR 17X24 6PK STRL BLUE (TOWEL DISPOSABLE) ×3 IMPLANT
TOWEL OR 17X26 10 PK STRL BLUE (TOWEL DISPOSABLE) ×3 IMPLANT
WATER STERILE IRR 1000ML POUR (IV SOLUTION) ×3 IMPLANT

## 2014-02-12 NOTE — Clinical Social Work Note (Signed)
CSW consulted for possible SNF placement at time of discharge. CSW will continue to follow pt and assist with discharge planning as needed.  Lubertha Sayres, MSW, [facility] Licensed Clinical Social Worker [phone] and [phone]

## 2014-02-12 NOTE — Anesthesia Preprocedure Evaluation (Addendum)
Anesthesia Evaluation  Patient identified by MRN, date of birth, ID band Patient awake    Reviewed: Allergy & Precautions, H&P , NPO status , Patient's Chart, lab work & pertinent test results  History of Anesthesia Complications Negative for: history of anesthetic complications  Airway Mallampati: II  Neck ROM: Full    Dental  (+) Teeth Intact, Dental Advisory Given   Pulmonary sleep apnea , former smoker,  breath sounds clear to auscultation        Cardiovascular hypertension, + Valvular Problems/Murmurs AS and MR Rhythm:Regular Rate:Normal + Systolic murmurs    Neuro/Psych    GI/Hepatic GERD-  ,  Endo/Other  diabetes  Renal/GU      Musculoskeletal   Abdominal   Peds  Hematology   Anesthesia Other Findings   Reproductive/Obstetrics                          Anesthesia Physical Anesthesia Plan  ASA: III  Anesthesia Plan: General   Post-op Pain Management:    Induction: Intravenous  Airway Management Planned: Oral ETT  Additional Equipment:   Intra-op Plan:   Post-operative Plan: Extubation in OR  Informed Consent: I have reviewed the patients History and Physical, chart, labs and discussed the procedure including the risks, benefits and alternatives for the proposed anesthesia with the patient or authorized representative who has indicated his/her understanding and acceptance.   Dental advisory given  Plan Discussed with: CRNA and Surgeon  Anesthesia Plan Comments:         Anesthesia Quick Evaluation

## 2014-02-12 NOTE — Transfer of Care (Signed)
Immediate Anesthesia Transfer of Care Note  Patient: [facility]  Procedure(s) Performed: Procedure(s): LUMBAR TWO-THREE,LUIMBAR THREE-FOUR LAMINECTOMY/FORAMINOTOMY;POSSIBLE POSTEROLATERAL ARTHRODESIS WITH AUTOGRAFT (N/A)  Patient Location: PACU  Anesthesia Type:General  Level of Consciousness: awake, alert  and oriented  Airway & Oxygen Therapy: Patient Spontanous Breathing and Patient connected to nasal cannula oxygen  Post-op Assessment: Report given to PACU RN and Post -op Vital signs reviewed and stable  Post vital signs: Reviewed and stable  Complications: No apparent anesthesia complications

## 2014-02-12 NOTE — Anesthesia Postprocedure Evaluation (Signed)
  Anesthesia Post-op Note  Patient: [facility]  Procedure(s) Performed: Procedure(s): LUMBAR TWO-THREE,LUIMBAR THREE-FOUR LAMINECTOMY/FORAMINOTOMY;POSSIBLE POSTEROLATERAL ARTHRODESIS WITH AUTOGRAFT (N/A)  Patient Location: PACU  Anesthesia Type:General  Level of Consciousness: awake and alert   Airway and Oxygen Therapy: Patient Spontanous Breathing  Post-op Pain: mild  Post-op Assessment: Post-op Vital signs reviewed  Post-op Vital Signs: stable  Last Vitals:  Filed Vitals:   02/12/14 1315  BP: 127/56  Pulse: 62  Temp:   Resp: 17    Complications: No apparent anesthesia complications

## 2014-02-12 NOTE — Op Note (Signed)
[facility], [facility] NO.:  [id number]  MEDICAL RECORD NO.:  [id number]  LOCATION:  [id number]                        FACILITY:  [facility]  PHYSICIAN:  Leeroy Cha, M.D.   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  PROCEDURE:  Bilateral L2, L3, and L4 laminectomy, foraminotomy, and resection of large calcified synovial cyst at the level of L2-3 to the right.  Posterolateral arthrodesis with autograft and Vitoss, microscope.  SURGEON:  Leeroy Cha, M.D.  ASSISTANT:  Kary Kos, M.D.  CLINICAL HISTORY:  [name] is a gentleman, who came to my office complaining of back pain, worsened to both lower extremity associated with weakness.  X-ray showed scoliosis with degenerative disk disease and severe arthropathy, and by MRI severe stenosis at the L2-3 and L3-4. Flexion and extension did not show any instability.  Surgery was advised.  The patient knew the risks and benefits of surgery.  PROCEDURE IN DETAIL:  The patient was taken to the OR, and after intubation, he was positioned in a prone manner.  The back was cleaned with DuraPrep and drapes were applied.  Midline incision was made and x- ray showed that indeed we were right at the level of L2-3 and L3-4. From then on, incision was carried down to the level of L4.  Muscle were retracted bilaterally.  We were able to see the lateral aspect of the facet.  Then, with the Leksell we removed the spinous process of L2, L3, and 4.  Then, using the drill as well as the 2 and 3 mm Kerrison punch, we removed the lamina bilaterally.   The patient had calcification of the ligament at the level of L2-3 to the right.  The patient has a calcified synovial cyst.  We had to use the microscope to be able to dissect the cyst away from the dura mater.  Then, we went laterally and we were able to decompress the spinal canal all the way down from L2 down to L4 with foraminotomy to decompress the L2, L3, L4, and L5 nerve root.  At the end, we had plenty of space for the thecal sac.  Valsalva maneuver was negative.  Then, using the drill, we removed the periosteum of the lateral facet of L2-3, L3-4 and a mix of autograft. BMP were used for arthrodesis.  The area was irrigated.  Second Valsalva maneuver was negative.  We investigated again the foramen and they were completely opened.  Then, a large drain was left in the epidural space and the wound was closed with Vicryl and Steri-Strips.          ______________________________ Leeroy Cha, M.D.     EB/MEDQ  D:  02/12/2014  T:  02/12/2014  Job:  [id number]

## 2014-02-12 NOTE — Anesthesia Procedure Notes (Signed)
Procedure Name: Intubation Date/Time: 02/12/2014 8:43 AM Performed by: Susa Loffler Pre-anesthesia Checklist: Patient identified, Timeout performed, Emergency Drugs available, Suction available and Patient being monitored Patient Re-evaluated:Patient Re-evaluated prior to inductionOxygen Delivery Method: Circle system utilized Preoxygenation: Pre-oxygenation with 100% oxygen Intubation Type: IV induction Laryngoscope Size: Miller and 2 Grade View: Grade II Tube type: Oral Tube size: 7.5 mm Number of attempts: 1 Airway Equipment and Method: Stylet Placement Confirmation: ETT inserted through vocal cords under direct vision,  positive ETCO2 and breath sounds checked- equal and bilateral Secured at: 23 cm Tube secured with: Tape Dental Injury: Teeth and Oropharynx as per pre-operative assessment

## 2014-02-13 LAB — GLUCOSE, CAPILLARY
Glucose-Capillary: 126 mg/dL — ABNORMAL HIGH (ref 70–99)
Glucose-Capillary: 161 mg/dL — ABNORMAL HIGH (ref 70–99)
Glucose-Capillary: 133 mg/dL — ABNORMAL HIGH (ref 70–99)
Glucose-Capillary: 171 mg/dL — ABNORMAL HIGH (ref 70–99)

## 2014-02-13 MED ORDER — ATORVASTATIN CALCIUM 40 MG PO TABS
40.0000 mg | ORAL_TABLET | Freq: Every day | ORAL | Status: DC
  Administered 2014-02-13 – 2014-02-15 (×3): 40 mg via ORAL
  Filled 2014-02-13 (×3): qty 1

## 2014-02-13 MED ORDER — EZETIMIBE 10 MG PO TABS
10.0000 mg | ORAL_TABLET | Freq: Every day | ORAL | Status: DC
  Administered 2014-02-13 – 2014-02-17 (×5): 10 mg via ORAL
  Filled 2014-02-13 (×5): qty 1

## 2014-02-13 NOTE — Progress Notes (Signed)
Hemovac d/c'd per MD's order.

## 2014-02-13 NOTE — Progress Notes (Signed)
CARE MANAGEMENT NOTE 02/13/2014  Patient:  [facility], [facility]   Account Number:  [id number]  Date Initiated:  02/13/2014  Documentation initiated by:  Olga Coaster  Subjective/Objective Assessment:   ADMITTED FOR BACK SURGERY     Action/Plan:   CM FOLLOWING FOR DCP   Anticipated DC Date:  02/17/2014   Anticipated DC Plan:  [place]  CM consult        Status of service:  In process, will continue to follow Medicare Important Message given?   (If response is "NO", the following Medicare IM given date fields will be blank)  Per UR Regulation:  Reviewed for med. necessity/level of care/duration of stay  Comments:  7/16/2015Mindi Slicker RN,BSN,MHA [phone]

## 2014-02-13 NOTE — Evaluation (Signed)
Physical Therapy Evaluation Patient Details Name: Cody Phelps MRN: [id number] DOB: 1941-05-07 Today's Date: 02/13/2014   History of Present Illness  s/p L2-L4 laminectomies and foaminotomies   Clinical Impression  Patient is s/p surgery listed above resulting in the deficits listed below (see PT Problem List).  Patient will benefit from skilled PT to increase their independence and safety with mobility (while adhering to their precautions) to allow discharge home with wife. Pt very anxious and guarded initially. With mobility progressed to min guard (A). Will need RW to increase independence with mobility upon D/C home.      Follow Up Recommendations No PT follow up;Supervision/Assistance - 24 hour    Equipment Recommendations  Rolling walker with 5" wheels    Recommendations for Other Services OT consult     Precautions / Restrictions Precautions Precautions: Back Precaution Booklet Issued: Yes (comment) Precaution Comments: given handout and educated on back precautions throughout session  Restrictions Weight Bearing Restrictions: No      Mobility  Bed Mobility Overal bed mobility: Needs Assistance Bed Mobility: Rolling;Sidelying to Sit Rolling: Min guard Sidelying to sit: Min guard       General bed mobility comments: min guard to facilitate log rolling technique   Transfers Overall transfer level: Needs assistance Equipment used: Rolling walker (2 wheeled) Transfers: Sit to/from Stand Sit to Stand: Mod assist         General transfer comment: pt very anxous for transfers; required incr (A) to elevate hips and achieve upright standing position; cues for hand placement and positioning   Ambulation/Gait Ambulation/Gait assistance: Min guard Ambulation Distance (Feet): 100 Feet Assistive device: Rolling walker (2 wheeled) Gait Pattern/deviations: Step-through pattern;Shuffle Gait velocity: decreased due to pain  Gait velocity interpretation: Below normal speed  for age/gender General Gait Details: cues for step through gt sequencing and technique with directional changes to adhere to back precautions   Stairs            Wheelchair Mobility    Modified Rankin (Stroke Patients Only)       Balance Overall balance assessment: Needs assistance;History of Falls Sitting-balance support: Feet supported;Single extremity supported Sitting balance-Leahy Scale: Fair Sitting balance - Comments: pt guarded due to pain    Standing balance support: During functional activity;Bilateral upper extremity supported Standing balance-Leahy Scale: Poor Standing balance comment: pt relying heavily on UE support due to pain                              Pertinent Vitals/Pain 3-4/10; patient repositioned for comfort     Home Living Family/patient expects to be discharged to:: Private residence Living Arrangements: Spouse/significant other Available Help at Discharge: Family;Available 24 hours/day Type of Home: House Home Access: Stairs to enter Entrance Stairs-Rails: Right Entrance Stairs-Number of Steps: 3 Home Layout: One level Home Equipment: Walker - standard;Cane - single point;Bedside commode;Shower seat Additional Comments: pt has walk in shower with seat    Prior Function Level of Independence: Independent with assistive device(s)         Comments: ambulated with cane as neeed for pain      Hand Dominance        Extremity/Trunk Assessment   Upper Extremity Assessment: Defer to OT evaluation           Lower Extremity Assessment: LLE deficits/detail   LLE Deficits / Details: hip 3/5; quad WFL   Cervical / Trunk Assessment: Normal  Communication   Communication: No  difficulties  Cognition Arousal/Alertness: Awake/alert Behavior During Therapy: Anxious Overall Cognitive Status: Within Functional Limits for tasks assessed                      General Comments      Exercises         Assessment/Plan    PT Assessment Patient needs continued PT services  PT Diagnosis Difficulty walking;Generalized weakness;Acute pain   PT Problem List Decreased strength;Decreased balance;Decreased mobility;Decreased knowledge of use of DME;Decreased knowledge of precautions;Pain  PT Treatment Interventions DME instruction;Gait training;Stair training;Functional mobility training;Therapeutic activities;Therapeutic exercise;Balance training;Neuromuscular re-education;Patient/family education   PT Goals (Current goals can be found in the Care Plan section) Acute Rehab PT Goals Patient Stated Goal: to get moving better PT Goal Formulation: With patient/family Time For Goal Achievement: 02/16/14 Potential to Achieve Goals: Good    Frequency Min 5X/week   Barriers to discharge        Co-evaluation               End of Session Equipment Utilized During Treatment: Gait belt Activity Tolerance: Patient tolerated treatment well Patient left: in chair;with call bell/phone within reach;with nursing/sitter in room;with family/visitor present Nurse Communication: Mobility status         Time: 1030-1057 PT Time Calculation (min): 27 min   Charges:   PT Evaluation $Initial PT Evaluation Tier I: 1 Procedure PT Treatments $Gait Training: 8-22 mins   PT G CodesGustavus Bryant, [place] 02/13/2014, 12:05 PM

## 2014-02-13 NOTE — Progress Notes (Signed)
Occupational Therapy Evaluation Patient Details Name: Cody Phelps MRN: [id number] DOB: 1940-10-26 Today's Date: 02/13/2014    History of Present Illness Cody Phelps is a 73 y.o. Male s/p L2-L4 laminectomies and foraminotomies on 02/12/14.   Clinical Impression   PTA pt lived at home and was independent with use of assistive device [facility]) which he used as needed due to pain. Pt moving slowly and requires mod (A) to power up from sitting, however making good progress. Pt and wife educated on 3/3 back precautions and incorporating them into ADLs with pt teach back. Education and training completed for fall prevention, energy conservation, and compensatory techniques for LB ADLs. Pt would benefit from skilled OT to increase independence with ADLs and functional mobility prior to d/c home.     Follow Up Recommendations  No OT follow up;Supervision/Assistance - 24 hour    Equipment Recommendations  3 in 1 bedside comode       Precautions / Restrictions Precautions Precautions: Back Precaution Booklet Issued: Yes (comment) Precaution Comments: Educated pt and wife on precautions and incorporating into ADLs.  Restrictions Weight Bearing Restrictions: No      Mobility Bed Mobility Overal bed mobility: Needs Assistance Bed Mobility: Rolling;Sidelying to Sit;Sit to Sidelying Rolling: Min guard Sidelying to sit: Min assist (for truncal support off bed)     Sit to sidelying: Min guard General bed mobility comments: Pt required physical (A) to push torso up off bed, however doing well with log roll technique.   Transfers Overall transfer level: Needs assistance Equipment used: Rolling walker (2 wheeled) Transfers: Sit to/from Stand Sit to Stand: Mod assist         General transfer comment: Pt doing better with transfers when given VC's for hand placement and sequencing. Pt does better when standing from a chair with arm rests to push up from. Mod (A) required to power up, however pt  quickly progressing to Min (A).     Balance Overall balance assessment: Needs assistance Sitting-balance support: Feet supported;Single extremity supported Sitting balance-Leahy Scale: Fair     Standing balance support: Bilateral upper extremity supported;During functional activity Standing balance-Leahy Scale: Poor Standing balance comment: Encouraged pt to decrease WB through UEs on RW for ease of walking with DME. Pt having difficulty and relying heavily on UE when using RW due to pain.                             ADL Overall ADL's : Needs assistance/impaired Eating/Feeding: Independent;Sitting   Grooming: Min guard;Standing   Upper Body Bathing: Set up;Sitting   Lower Body Bathing: Sit to/from stand;Moderate assistance   Upper Body Dressing : Set up;Sitting   Lower Body Dressing: Moderate assistance;Sit to/from stand   Toilet Transfer: Moderate assistance;Ambulation;RW;Grab bars;BSC (BSC over toilet)   Toileting- Clothing Manipulation and Hygiene: Sit to/from stand;Moderate assistance   Tub/ Shower Transfer: Walk-in shower;Minimal assistance;Rolling walker;Ambulation;Shower seat (sit<>stand)   Functional mobility during ADLs: Min guard;Rolling walker General ADL Comments: Pt requires mod (A) to stand, however has improved transitions when sitting on BSC with armrests to push up from. Pt with good functional mobility but moving slowly. Educated pt on crossing legs to don/doff socks and shoes, fall prevention strategies, and energy conservation. Educated pt on incorporating back precautions into ADLs, including use of baby wipes and tongs for toilet hygiene.      Vision  Pt reports no change from baseline.  No apparent visual deficits.  Perception Perception Perception Tested?: No   Praxis Praxis Praxis tested?: Within functional limits    Pertinent Vitals/Pain 4/10 in back surgical site; repositioned pt in bed following treatment  for comfort.      Hand Dominance Right   Extremity/Trunk Assessment Upper Extremity Assessment Upper Extremity Assessment: Overall WFL for tasks assessed   Lower Extremity Assessment Lower Extremity Assessment: Defer to PT evaluation   Cervical / Trunk Assessment Cervical / Trunk Assessment: Normal   Communication Communication Communication: No difficulties   Cognition Arousal/Alertness: Awake/alert Behavior During Therapy: WFL for tasks assessed/performed Overall Cognitive Status: Within Functional Limits for tasks assessed                                Home Living Family/patient expects to be discharged to:: Private residence Living Arrangements: Spouse/significant other Available Help at Discharge: Family;Available 24 hours/day Type of Home: House Home Access: Stairs to enter [employer] of Steps: 3 Entrance Stairs-Rails: Right Home Layout: One level     Bathroom Shower/Tub: Occupational psychologist: Standard     Home Equipment: Walker - standard;Cane - single point;Bedside commode;Shower seat          Prior Functioning/Environment Level of Independence: Independent with assistive device(s)        Comments: ambulated with cane as needed for pain    OT Diagnosis: Generalized weakness;Acute pain   OT Problem List: Decreased strength;Decreased range of motion;Decreased activity tolerance;Impaired balance (sitting and/or standing);Decreased safety awareness;Decreased knowledge of use of DME or AE;Decreased knowledge of precautions;Pain   OT Treatment/Interventions: Self-care/ADL training;Energy conservation;DME and/or AE instruction;Therapeutic activities;Patient/family education;Balance training    OT Goals(Current goals can be found in the care plan section) Acute Rehab OT Goals Patient Stated Goal: to go home OT Goal Formulation: With patient/family Time For Goal Achievement: 02/20/14 Potential to Achieve Goals:  Good ADL Goals Pt Will Perform Grooming: with modified independence;standing Pt Will Perform Lower Body Bathing: with modified independence;with adaptive equipment;sit to/from stand Pt Will Perform Lower Body Dressing: with modified independence;with adaptive equipment;sit to/from stand Pt Will Transfer to Toilet: with modified independence;ambulating;bedside commode Pt Will Perform Toileting - Clothing Manipulation and hygiene: with modified independence;with adaptive equipment;sit to/from stand Pt Will Perform Tub/Shower Transfer: Shower transfer;with modified independence;ambulating;shower seat;rolling walker Additional ADL Goal #1: Pt will verbalize 3/3 back precautions Independently to increase safety with ADLs. Additional ADL Goal #2: Pt will perform bed mobility with Supervision using log roll technique to prepare for ADLs.   OT Frequency: Min 2X/week    End of Session Equipment Utilized During Treatment: Gait belt;Rolling walker Nurse Communication: Mobility status  Activity Tolerance: Patient tolerated treatment well Patient left: in bed;with call bell/phone within reach;with bed alarm set;with family/visitor present;Other (comment) (with CM from [facility])   Time: 1413-1440 OT Time Calculation (min): 27 min Charges:  OT General Charges $OT Visit: 1 Procedure OT Evaluation $Initial OT Evaluation Tier I: 1 Procedure OT Treatments $Self Care/Home Management : 23-37 mins  Cody Phelps [phone] 02/13/2014, 3:46 PM

## 2014-02-13 NOTE — Progress Notes (Signed)
Patient ID: Cody Phelps, male   DOB: Apr 27, 1941, 73 y.o.   MRN: [id number] C/o incisional pain. hemovac to be dc. Pt to help.

## 2014-02-13 NOTE — Clinical Social Work Note (Signed)
Per chart review, pt recommended with no PT follow-up at time of discharge. CSW signing off. Thank you for the referral.  Lubertha Sayres, MSW, [facility] Licensed Clinical Social Worker [phone] and [phone]

## 2014-02-14 ENCOUNTER — Encounter (HOSPITAL_COMMUNITY): Payer: Self-pay | Admitting: Neurosurgery

## 2014-02-14 LAB — GLUCOSE, CAPILLARY
Glucose-Capillary: 110 mg/dL — ABNORMAL HIGH (ref 70–99)
Glucose-Capillary: 146 mg/dL — ABNORMAL HIGH (ref 70–99)
Glucose-Capillary: 150 mg/dL — ABNORMAL HIGH (ref 70–99)
Glucose-Capillary: 152 mg/dL — ABNORMAL HIGH (ref 70–99)

## 2014-02-14 MED ORDER — INSULIN ASPART 100 UNIT/ML ~~LOC~~ SOLN: 0.0000 [IU] | Freq: Every day | SUBCUTANEOUS | Status: DC

## 2014-02-14 MED ORDER — HYDROCODONE-ACETAMINOPHEN 10-325 MG PO TABS
1.0000 | ORAL_TABLET | ORAL | Status: DC | PRN
  Administered 2014-02-14 – 2014-02-17 (×9): 1 via ORAL
  Filled 2014-02-14 (×10): qty 1

## 2014-02-14 MED ORDER — INSULIN ASPART 100 UNIT/ML ~~LOC~~ SOLN: 0.0000 [IU] | Freq: Three times a day (TID) | SUBCUTANEOUS | Status: DC

## 2014-02-14 NOTE — Progress Notes (Signed)
Patient ID: Cody Phelps, male   DOB: 1941/05/23, 73 y.o.   MRN: [id number] Still having incisional pain. No weakness .`

## 2014-02-14 NOTE — Progress Notes (Signed)
Patient ID: Cody Phelps, male   DOB: 12-18-1940, 73 y.o.   MRN: [id number] To be off oxycodone.

## 2014-02-14 NOTE — Progress Notes (Signed)
Physical Therapy Treatment Patient Details Name: Cody Phelps MRN: [id number] DOB: 21-Feb-1941 Today's Date: 02/14/2014    History of Present Illness Cody Phelps is a 73 y.o. Male s/p L2-L4 laminectomies and foraminotomies on 02/12/14.    PT Comments    Pt progressing slowly with mobility. Encouraged pt and wife to increase ambulation today. Wife reports pt only walked with therapy yesterday. Pt reports difficulty controlling pain and tolerating pain medication. Patient needs to practice stairs next session.     Follow Up Recommendations  No PT follow up;Supervision/Assistance - 24 hour     Equipment Recommendations  Rolling walker with 5" wheels    Recommendations for Other Services OT consult     Precautions / Restrictions Precautions Precautions: Back Precaution Comments: pt unable to recall back precautions; reviewed with pt and wife  Restrictions Weight Bearing Restrictions: No    Mobility  Bed Mobility Overal bed mobility: Needs Assistance Bed Mobility: Rolling;Sidelying to Sit Rolling: Supervision Sidelying to sit: Supervision       General bed mobility comments: max cues for log rolling and technique; pt requires incr time due to pain but no physical (A) required   Transfers Overall transfer level: Needs assistance Equipment used: Rolling walker (2 wheeled) Transfers: Sit to/from Stand Sit to Stand: Min assist         General transfer comment: cues for hand placement and safety with RW; min (A) to power up to standing and maintain balance; incr time due to pain   Ambulation/Gait Ambulation/Gait assistance: Supervision Ambulation Distance (Feet): 175 Feet Assistive device: Rolling walker (2 wheeled) Gait Pattern/deviations: Step-through pattern;Decreased stride length Gait velocity: decreased due to pain  Gait velocity interpretation: Below normal speed for age/gender     Stairs            Wheelchair Mobility    Modified Rankin (Stroke  Patients Only)       Balance Overall balance assessment: Needs assistance Sitting-balance support: Single extremity supported;Feet supported Sitting balance-Leahy Scale: Fair Sitting balance - Comments: UE support due to pain   Standing balance support: Bilateral upper extremity supported;During functional activity Standing balance-Leahy Scale: Poor Standing balance comment: RW for bil UE support at all times                     Cognition Arousal/Alertness: Awake/alert Behavior During Therapy: WFL for tasks assessed/performed Overall Cognitive Status: Within Functional Limits for tasks assessed       Memory: Decreased recall of precautions;Decreased short-term memory              Exercises General Exercises - Lower Extremity Ankle Circles/Pumps: AROM;Both;10 reps Long Arc Quad: AROM;Strengthening;Both;10 reps;Seated Hip Flexion/Marching: AROM;Strengthening;Both;5 reps    General Comments General comments (skin integrity, edema, etc.): pt requesting to use bathroom; required min (A) but quickly progressing to min guard for toilet transfers       Pertinent Vitals/Pain 8/10; RN aware     Home Living                      Prior Function            PT Goals (current goals can now be found in the care plan section) Acute Rehab PT Goals Patient Stated Goal: to get the pain medication figured out  PT Goal Formulation: With patient/family Time For Goal Achievement: 02/16/14 Potential to Achieve Goals: Good Progress towards PT goals: Progressing toward goals    Frequency  Min 5X/week  PT Plan Current plan remains appropriate    Co-evaluation             End of Session Equipment Utilized During Treatment: Gait belt Activity Tolerance: Patient tolerated treatment well Patient left: in chair;with call bell/phone within reach;with family/visitor present     Time: 3845-3646 PT Time Calculation (min): 25 min  Charges:  $Gait Training:  8-22 mins $Therapeutic Activity: 8-22 mins                    G CodesGustavus Bryant, [place] 02/14/2014, 8:46 AM

## 2014-02-15 LAB — GLUCOSE, CAPILLARY
Glucose-Capillary: 84 mg/dL (ref 70–99)
Glucose-Capillary: 113 mg/dL — ABNORMAL HIGH (ref 70–99)
Glucose-Capillary: 118 mg/dL — ABNORMAL HIGH (ref 70–99)
Glucose-Capillary: 109 mg/dL — ABNORMAL HIGH (ref 70–99)

## 2014-02-15 MED ORDER — KETOROLAC TROMETHAMINE 15 MG/ML IJ SOLN
15.0000 mg | Freq: Four times a day (QID) | INTRAMUSCULAR | Status: AC
  Administered 2014-02-15 – 2014-02-16 (×5): 15 mg via INTRAMUSCULAR
  Filled 2014-02-15 (×5): qty 1

## 2014-02-15 MED ORDER — KETOROLAC TROMETHAMINE 15 MG/ML IJ SOLN
15.0000 mg | Freq: Four times a day (QID) | INTRAMUSCULAR | Status: DC
Start: 2014-02-15 — End: 2014-02-15

## 2014-02-15 NOTE — Progress Notes (Signed)
Encouraging patient to eat breakfast and sit in the chair this am.  He is refusing to get up.  This RN educated as to the benefits of getting out of bed and the complications for patients who do not.  Patient still refuses even after his wife encouraged him.  Will continue to encourage and monitor patient.  Kizzie Bane, RN

## 2014-02-15 NOTE — Progress Notes (Signed)
Physical Therapy Treatment Patient Details Name: Cody Phelps MRN: [id number] DOB: 1941-01-23 Today's Date: 02/15/2014    History of Present Illness Cody Phelps is a 73 y.o. Male s/p L2-L4 laminectomies and foraminotomies on 02/12/14.    PT Comments    Pt not progressing with mobility this session.  Required +2 to stand from recliner & mod/max assist to stand from 3-in-1 & only able to ambulate short distance due to pain.  Discussed with pt high importance of increasing mobility with Nsing.  Also discussed with pt & wife that he may need SNF if mobility does not progress as his is very petite & will not be able to assist pt with transfers.  Will cont to follow acutely & make changes to d/c recommendation if needed.     Follow Up Recommendations  No OT follow up;Supervision/Assistance - 24 hour;Home vs SNF if pt. Cont. With pain management affecting physical progress   Equipment Recommendations  Rolling walker with 5" wheels    Recommendations for Other Services OT consult     Precautions / Restrictions Precautions Precautions: Back Precaution Comments: reviewed back precautions- pt able to recall 2/3  Restrictions Weight Bearing Restrictions: No    Mobility  Bed Mobility Overal bed mobility: Needs Assistance Bed Mobility: Rolling;Sidelying to Sit Rolling: Min assist Sidelying to sit: Max assist       General bed mobility comments: max cues for log rolling and technique; pt requires incr time due to pain   Transfers Overall transfer level: Needs assistance Equipment used: Rolling walker (2 wheeled) Transfers: Sit to/from Stand Sit to Stand: Mod assist;+2 physical assistance         General transfer comment: cues for hand placement.  +2 to stand from recliner & Mod assist to stand from 3-in-1.  Pt is able to lift hips off seat but then has difficulty getting knees extended to transition to complete standing upright.    Ambulation/Gait Ambulation/Gait assistance: Min  guard Ambulation Distance (Feet): 60 Feet (30'x2) Assistive device: Rolling walker (2 wheeled) Gait Pattern/deviations: Step-through pattern;Decreased stride length Gait velocity: decreased   General Gait Details: gait pattern fluctuates between step to<>step through & with slow gait speed.  Pt's wife reports pt's gait quality was much smoother in previous PT session (see PT note from 02/14/14)   Stairs            Wheelchair Mobility    Modified Rankin (Stroke Patients Only)       Balance                                    Cognition Arousal/Alertness: Awake/alert Behavior During Therapy: Flat affect Overall Cognitive Status: Within Functional Limits for tasks assessed                      Exercises      General Comments        Pertinent Vitals/Pain 8/10 back.  Premedicated.  Repositioned for comfort.      Home Living                      Prior Function            PT Goals (current goals can now be found in the care plan section) Acute Rehab PT Goals PT Goal Formulation: With patient/family Time For Goal Achievement: 02/16/14 Potential to Achieve Goals: Good Progress towards PT  goals: Not progressing toward goals - comment    Frequency  Min 5X/week    PT Plan Current plan remains appropriate    Co-evaluation             End of Session Equipment Utilized During Treatment: Gait belt Activity Tolerance: Patient limited by fatigue;Patient limited by pain Patient left: in chair;with call bell/phone within reach;with chair alarm set;with family/visitor present     Time: 1023-1050 PT Time Calculation (min): 27 min  Charges:  $Gait Training: 8-22 mins $Therapeutic Activity: 8-22 mins                    G Codes:      Sena Hitch 02/15/2014, 2:03 PM   Sarajane Marek, PTA [phone] 02/15/2014

## 2014-02-15 NOTE — Progress Notes (Signed)
Patient ID: Cody Phelps, male   DOB: [birth year], 73 y.o.   MRN: [id number] Subjective: Patient reports continued Low back pain, no real leg pain or N/T/W. Trouble mobilizing because of pain.   Objective: Vital signs in last 24 hours: Temp:  [99 F (37.2 C)-100.5 F (38.1 C)] 100.4 F (38 C) (07/18 1002) Pulse Rate:  [81-105] 90 (07/18 1002) Resp:  [18-20] 20 (07/18 1002) BP: (123-144)/(46-62) 141/62 mmHg (07/18 1002) SpO2:  [93 %-98 %] 96 % (07/18 1002)  Intake/Output from previous day: 07/17 0701 - 07/18 0700 In: -  Out: 200 [Urine:200] Intake/Output this shift: Total I/O In: 240 [P.O.:240] Out: -   Neurologic: Grossly normal  Lab Results: Lab Results  Component Value Date   WBC 5.6 02/03/2014   HGB 12.3* 02/03/2014   HCT 37.1* 02/03/2014   MCV 87.7 02/03/2014   PLT 247 02/03/2014   No results found for this basename: INR, PROTIME   BMET Lab Results  Component Value Date   NA 141 02/03/2014   K 3.8 02/03/2014   CL 102 02/03/2014   CO2 24 02/03/2014   GLUCOSE 200* 02/03/2014   BUN 13 02/03/2014   CREATININE 0.88 02/03/2014   CALCIUM 9.3 02/03/2014    Studies/Results: No results found.  Assessment/Plan: Continue pain control and PT   LOS: 3 days    Shamere Campas S 02/15/2014, 1:18 PM

## 2014-02-15 NOTE — Progress Notes (Signed)
Occupational Therapy Treatment Patient Details Name: Cody Phelps MRN: [id number] DOB: June 20, 1941 Today's Date: 02/15/2014    History of present illness Cody Phelps is a 73 y.o. Male s/p L2-L4 laminectomies and foraminotomies on 02/12/14.   OT comments  Pt. Hesitant for participation in skilled therapies secondary to c/o back pain.  Required max encouragement and coaxing to participate in functional transfers.  Required mod a for sit/stand unable to push through legs to transition into standing.  Also max a for bed mobility today.  Spoke with pt. And spouse that SNF may be an option if pt. Unable to progress to safe d/c home from hospital.   Follow Up Recommendations  No OT follow up;Supervision/Assistance - 24 hour;SNF vs SNF if pt. Cont. With pain management affecting physical progress   Equipment Recommendations  3 in 1 bedside comode          Precautions / Restrictions Precautions Precautions: Back Precaution Comments: pt unable to recall back precautions; reviewed with pt and wife  Restrictions Weight Bearing Restrictions: No       Mobility Bed Mobility Overal bed mobility: Needs Assistance Bed Mobility: Rolling;Sidelying to Sit Rolling: Min assist Sidelying to sit: Max assist       General bed mobility comments: max cues for log rolling and technique; pt requires incr time due to pain   Transfers Overall transfer level: Needs assistance Equipment used: Rolling walker (2 wheeled) Transfers: Sit to/from Stand Sit to Stand: Mod assist;Max assist         General transfer comment: cues for hand placement and safety with RW; min (A) to power up to standing and maintain balance; incr time due to pain and tends to get stuck with B knees in flexed position with max a and encouragement to transition into standing    Balance                                   ADL Overall ADL's : Needs assistance/impaired                         Toilet  Transfer: Moderate assistance;Ambulation;RW;Grab bars;Comfort height toilet;BSC   Toileting- Clothing Manipulation and Hygiene: Sit to/from stand;Moderate assistance       Functional mobility during ADLs: Minimal assistance General ADL Comments: pt. with flat affect.  resistant to any functional movement.  requires a lot of coaxing                                                                                                       Pertinent Vitals/ Pain       8/10 per pt. Assisting with repositioning and ambulation  Frequency Min 2X/week     Progress Toward Goals  OT Goals(current goals can now be found in the care plan section)  Progress towards OT goals: Not progressing toward goals - comment;Progressing toward goals (limited by pain)     Plan Discharge plan needs to be updated                     End of Session Equipment Utilized During Treatment: Rolling walker   Activity Tolerance Patient limited by pain;Patient limited by lethargy   Patient Left Other (comment) (b.room with PT and wife present)   Nurse Communication Mobility status        Time: 1000-1023 OT Time Calculation (min): 23 min  Charges: OT General Charges $OT Visit: 1 Procedure OT Treatments $Self Care/Home Management : 23-37 mins  Janice Coffin, COTA/L 02/15/2014, 11:49 AM

## 2014-02-15 NOTE — Progress Notes (Signed)
Agree with PTA due to pt lack of progress. [place], [place], [place]

## 2014-02-15 NOTE — Progress Notes (Signed)
Spoke with Ubaldo Glassing [facility]) about the need to change pt's D/C plan to perhaps SNF due to pt's increased need for A today and decreased ability to pt's wife to A him this extensively. Agree with change in D/C plan.  [place], [place] [phone] 02/15/2014

## 2014-02-16 LAB — GLUCOSE, CAPILLARY
Glucose-Capillary: 114 mg/dL — ABNORMAL HIGH (ref 70–99)
Glucose-Capillary: 106 mg/dL — ABNORMAL HIGH (ref 70–99)
Glucose-Capillary: 102 mg/dL — ABNORMAL HIGH (ref 70–99)
Glucose-Capillary: 139 mg/dL — ABNORMAL HIGH (ref 70–99)

## 2014-02-16 MED ORDER — ROPINIROLE HCL 1 MG PO TABS
1.0000 mg | ORAL_TABLET | Freq: Every day | ORAL | Status: DC | PRN
  Administered 2014-02-16: 1 mg via ORAL

## 2014-02-16 NOTE — Progress Notes (Signed)
Subjective: Patient reports Offers no complaint legs are still weak. Wife notes he cannot get out of bed by himself. Feels he needs rehabilitation.  Objective: Vital signs in last 24 hours: Temp:  [98 F (36.7 C)-101 F (38.3 C)] 99.2 F (37.3 C) (07/19 0926) Pulse Rate:  [76-98] 80 (07/19 0926) Resp:  [18-20] 18 (07/19 0926) BP: (118-190)/(55-77) 137/59 mmHg (07/19 0926) SpO2:  [95 %-100 %] 95 % (07/19 0926)  Intake/Output from previous day: 07/18 0701 - 07/19 0700 In: 480 [P.O.:480] Out: 200 [Urine:200] Intake/Output this shift: Total I/O In: 240 [P.O.:240] Out: -   Dressing dry motor function appears good to confrontation and lower extremities.  Lab Results: No results found for this basename: WBC, HGB, HCT, PLT,  in the last 72 hours BMET No results found for this basename: NA, K, CL, CO2, GLUCOSE, BUN, CREATININE, CALCIUM,  in the last 72 hours  Studies/Results: No results found.  Assessment/Plan: Stable postop.  LOS: 4 days  May need to consider SNIF or rehabilitation   Cody Phelps J 02/16/2014, 9:34 AM

## 2014-02-16 NOTE — Progress Notes (Signed)
Physical Therapy Treatment Patient Details Name: Cody Phelps MRN: [id number] DOB: Nov 22, 1940 Today's Date: 02/16/2014    History of Present Illness Cody Phelps is a 73 y.o. Male s/p L2-L4 laminectomies and foraminotomies on 02/12/14.    PT Comments    Pt mobilizing much better today. Was able to increase ambulation distance and practice stair management. Pt at min guard for all mobility. Wife present during session and reports great improvement vs session yesterday. Pt and wife hopeful for D/C home tomorrow. Will plan to follow per POC.   Follow Up Recommendations  Home health PT;Supervision/Assistance - 24 hour     Equipment Recommendations  Rolling walker with 5" wheels    Recommendations for Other Services OT consult     Precautions / Restrictions Precautions Precautions: Back Precaution Comments: pt able to recall 3/3 back precautions independently today Restrictions Weight Bearing Restrictions: No    Mobility  Bed Mobility               General bed mobility comments: not addressed; pt up in chair and returned to chair   Transfers Overall transfer level: Needs assistance Equipment used: Rolling walker (2 wheeled) Transfers: Sit to/from Stand Sit to Stand: Min guard         General transfer comment: pt demo incr strenght today and was able to power up with use of armrests and without armrests on gym mat at min guard to steady  Ambulation/Gait Ambulation/Gait assistance: Min guard Ambulation Distance (Feet): 250 Feet (125 x2 ) Assistive device: Rolling walker (2 wheeled) Gait Pattern/deviations: Step-through pattern;Decreased stride length;Trunk flexed Gait velocity: decr due to pain Gait velocity interpretation: Below normal speed for age/gender General Gait Details: pt required 2 standing rest breaks and sat to rest in gym; cues for upright posture and gt sequencing with RW; no LOB noted today and pt ambulating with increased gt speed vs yesterday;  reports he is relying less on UEs    Stairs Stairs: Yes Stairs assistance: Min guard Stair Management: One rail Right;With cane;Step to pattern;Forwards (cane supporting Lt UE ) Number of Stairs: 4 (2x2) General stair comments: cues for sequencing and safe stair management technique; wife present for session for education; pt has cane at home for use  Wheelchair Mobility    Modified Rankin (Stroke Patients Only)       Balance           Standing balance support: During functional activity;No upper extremity supported Standing balance-Leahy Scale: Fair Standing balance comment: pt able to stand and rest without UE support for short periods of time to rest arms                    Cognition Arousal/Alertness: Awake/alert Behavior During Therapy: WFL for tasks assessed/performed Overall Cognitive Status: Within Functional Limits for tasks assessed                      Exercises      General Comments        Pertinent Vitals/Pain 2/10; premedicated by RN; patient repositioned for comfort      Home Living                      Prior Function            PT Goals (current goals can now be found in the care plan section) Acute Rehab PT Goals Patient Stated Goal: to move better PT Goal Formulation: With patient/family Time  For Goal Achievement: 02/18/14 Potential to Achieve Goals: Good Progress towards PT goals: Progressing toward goals    Frequency  Min 5X/week    PT Plan Current plan remains appropriate    Co-evaluation             End of Session Equipment Utilized During Treatment: Gait belt Activity Tolerance: Patient tolerated treatment well Patient left: in chair;with call bell/phone within reach;with family/visitor present     Time: 1123-1140 PT Time Calculation (min): 17 min  Charges:  $Gait Training: 8-22 mins                    G CodesGustavus Bryant, [place]  [phone] 02/16/2014, 3:16 PM

## 2014-02-17 LAB — GLUCOSE, CAPILLARY
Glucose-Capillary: 105 mg/dL — ABNORMAL HIGH (ref 70–99)
Glucose-Capillary: 150 mg/dL — ABNORMAL HIGH (ref 70–99)

## 2014-02-17 NOTE — Progress Notes (Signed)
CARE MANAGEMENT NOTE 02/17/2014  Patient:  Cody Phelps, Cody Phelps   Account Number:  [id number]  Date Initiated:  02/13/2014  Documentation initiated by:  Cody Phelps  Subjective/Objective Assessment:   ADMITTED FOR BACK SURGERY     Action/Plan:   CM FOLLOWING FOR DCP   Anticipated DC Date:  02/17/2014   Anticipated DC Plan:  Cody Phelps  CM consult         Status of service:  In process, will continue to follow Medicare Important Message given?  YES (If response is "NO", the following Medicare IM given date fields will be blank) Date Medicare IM given:  02/17/2014 Medicare IM given by:  Cody Phelps  Per UR Regulation:  Reviewed for med. necessity/level of care/duration of stay  Comments:  02/17/2014- Noted, PT recommendations for HHPT - Attending MD does not want his patient to have any [place] until after his office visitAneta Phelps [phone]  7/16/2015Mindi Slicker RN,BSN,MHA [phone]

## 2014-02-17 NOTE — Progress Notes (Addendum)
Occupational Therapy Treatment Patient Details Name: Cody Phelps MRN: [id number] DOB: 11-Aug-1940 Today's Date: 02/17/2014    History of present illness Cody Phelps is a 73 y.o. Male s/p L2-L4 laminectomies and foraminotomies on 02/12/14.   OT comments  Pt progressing and planning to d/c today. Wife present for education as well.   Follow Up Recommendations  No OT follow up;Supervision - Intermittent    Equipment Recommendations  3 in 1 bedside comode    Recommendations for Other Services      Precautions / Restrictions Precautions Precautions: Back Precaution Comments: Reviewed precautions with pt Restrictions Weight Bearing Restrictions: No       Mobility Bed Mobility Overal bed mobility: Needs Assistance Bed Mobility: Rolling;Sidelying to Sit;Sit to Sidelying Rolling: Supervision Sidelying to sit: Supervision     Sit to sidelying: Min guard General bed mobility comments: Cues for technique.  Transfers Overall transfer level: Needs assistance Equipment used: Rolling walker (2 wheeled) Transfers: Sit to/from Stand Sit to Stand: Min guard         General transfer comment: cues for technique    Balance                                   ADL Overall ADL's : Needs assistance/impaired                 Upper Body Dressing : Set up;Sitting;Supervision/safety   Lower Body Dressing: Minimal assistance;Sit to/from stand (wife assisted some)   Toilet Transfer: Min guard;Supervision/safety;RW;Ambulation (Min guard for transfer; chair)   Toileting- Water quality scientist and Hygiene: Set up;Supervision/safety (simulated standing; cues for precautions)   Tub/ Shower Transfer: Min guard;Ambulation;Rolling walker;Shower seat   Functional mobility during ADLs: Min guard;Supervision/safety;Rolling walker General ADL Comments: Educated on AE and also technique for LB ADLs. Educated on safety tips (rugs, sitting for LB ADLs, wife being with him for  shower transfer/bathing).  Spoke about position of pillows. Educated on incorporating precautions during functional activities.       Vision                     Perception     Praxis      Cognition   Behavior During Therapy: [facility] for tasks assessed/performed Overall Cognitive Status: Within Functional Limits for tasks assessed                       Extremity/Trunk Assessment               Exercises     Shoulder Instructions       General Comments      Pertinent Vitals/ Pain       Pain 1/10. Increased activity during session.   Home Living                                          Prior Functioning/Environment              Frequency Min 2X/week     Progress Toward Goals  OT Goals(current goals can now be found in the care plan section)  Progress towards OT goals: Progressing toward goals  Acute Rehab OT Goals Patient Stated Goal: not stated OT Goal Formulation: With patient/family Time For Goal Achievement: 02/20/14 Potential to Achieve Goals: Good ADL Goals Pt Will  Perform Grooming: with modified independence;standing Pt Will Perform Lower Body Bathing: with modified independence;with adaptive equipment;sit to/from stand Pt Will Perform Lower Body Dressing: with modified independence;with adaptive equipment;sit to/from stand Pt Will Transfer to Toilet: with modified independence;ambulating;bedside commode Pt Will Perform Toileting - Clothing Manipulation and hygiene: with modified independence;with adaptive equipment;sit to/from stand Pt Will Perform Tub/Shower Transfer: Shower transfer;with modified independence;ambulating;shower seat;rolling walker Additional ADL Goal #1: Pt will verbalize 3/3 back precautions Independently to increase safety with ADLs. Additional ADL Goal #2: Pt will perform bed mobility with Supervision using log roll technique to prepare for ADLs.   Plan Discharge plan needs to be updated     Co-evaluation                 End of Session Equipment Utilized During Treatment: Gait belt;Rolling walker   Activity Tolerance Patient tolerated treatment well   Patient Left in chair;with call bell/phone within reach;with family/visitor present   Nurse Communication          Time: 6295-2841 OT Time Calculation (min): 29 min  Charges: OT General Charges $OT Visit: 1 Procedure OT Treatments $Self Care/Home Management : 23-37 mins  Benito Mccreedy OTR/L [phone] 02/17/2014, 11:40 AM

## 2014-02-17 NOTE — Discharge Summary (Signed)
Physician Discharge Summary  Patient ID: Cody Phelps MRN: [id number] DOB/AGE: January 24, 1941 73 y.o.  Admit date: 02/12/2014 Discharge date: 02/17/2014  Admission Diagnoses:lumbar stenosis  Discharge Diagnoses:  Active Problems:   Lumbar stenosis with neurogenic claudication   Discharged Condition: no pain  Hospital Course: surgery  Consults:none  Significant Diagnostic Studies:mri   Treatments: lumbar decompression  pla  Discharge Exam: Blood pressure 124/57, pulse 79, temperature 97.8 F (36.6 C), temperature source Oral, resp. rate 18, height _0  (1.727 m), weight 227 lb 15.3 oz (103.4 kg), SpO2 98.00%. ambulating Disposition: home     Medication List    ASK your doctor about these medications       ACTOPLUS MET XR 30-1000 MG Tb24  Generic drug:  Pioglitazone HCl-Metformin HCl  Take 1 tablet by mouth daily.     Amlodipine-Valsartan-HCTZ 10-160-12.5 MG Tabs  Take 1 tablet by mouth daily.     aspirin 81 MG tablet  Take 81 mg by mouth daily.     colesevelam 625 MG tablet  Commonly known as:  WELCHOL  Take 1,875 mg by mouth 2 (two) times daily with a meal. Taking 3 tablets two times per day.     donepezil 10 MG tablet  Commonly known as:  ARICEPT  Take 10 mg by mouth daily.     esomeprazole 40 MG capsule  Commonly known as:  NEXIUM  Take 40 mg by mouth daily as needed (ACID).     ezetimibe-simvastatin 10-80 MG per tablet  Commonly known as:  VYTORIN  Take 1 tablet by mouth daily.     LANTUS SOLOSTAR 100 UNIT/ML injection  Generic drug:  insulin glargine  Inject 40 Units into the skin at bedtime.     mometasone 50 MCG/ACT nasal spray  Commonly known as:  NASONEX  Place 2 sprays into the nose daily as needed (ALLERGIES).     nabumetone 750 MG tablet  Commonly known as:  RELAFEN  Take 750 mg by mouth daily.     naproxen sodium 220 MG tablet  Commonly known as:  ANAPROX  Take 220 mg by mouth as needed.     omega-3 acid ethyl esters 1 G capsule   Commonly known as:  LOVAZA  Take 2 g by mouth 2 (two) times daily.     rOPINIRole 1 MG tablet  Commonly known as:  REQUIP  Take 1 mg by mouth at bedtime.         Signed: [name] 02/17/2014, 9:38 AM

## 2014-02-17 NOTE — Progress Notes (Signed)
PT Cancellation Note  Patient Details Name: Cody Phelps MRN: [id number] DOB: February 08, 1941   Cancelled Treatment:    Reason Eval/Treat Not Completed: Patient declined, no reason specified.  Patient preparing for discharge and declined PT today.  Patient negotiated stairs yesterday.  Patient and wife with no further questions.   Despina Pole 02/17/2014, 11:36 AM Carita Pian. Sanjuana Kava, [place] Pager [phone]

## 2014-02-17 NOTE — Progress Notes (Signed)
Discharge instructions given. Pt verbalized understanding and all questions were answered.  

## 2014-11-19 ENCOUNTER — Ambulatory Visit (INDEPENDENT_AMBULATORY_CARE_PROVIDER_SITE_OTHER): Payer: Medicare Other | Admitting: Neurology

## 2014-11-19 ENCOUNTER — Encounter: Payer: Self-pay | Admitting: Neurology

## 2014-11-19 VITALS — BP 135/72 | HR 67 | Temp 97.3°F | Ht 66.0 in | Wt 233.0 lb

## 2014-11-19 DIAGNOSIS — G2581 Restless legs syndrome: Secondary | ICD-10-CM | POA: Diagnosis not present

## 2014-11-19 MED ORDER — PREGABALIN 50 MG PO CAPS: 100.0000 mg | ORAL_CAPSULE | Freq: Two times a day (BID) | ORAL | Status: DC

## 2014-11-19 NOTE — Patient Instructions (Addendum)
Overall you are doing fairly well but I do want to suggest a few things today:   Remember to drink plenty of fluid, eat healthy meals and do not skip any meals. Try to eat protein with a every meal and eat a healthy snack such as fruit or nuts in between meals. Try to keep a regular sleep-wake schedule and try to exercise daily, particularly in the form of walking, 20-30 minutes a day, if you can.   As far as your medications are concerned, I would like to suggest:   ferrous sulfate (325 mg orally two to three times a day). Iron should be combined with vitamin C (100 to 200 mg with each dose of ferrous sulfate) or a glass of orange juice to enhance absorption. Iron should not be taken at the same time as calcium supplements or significant amounts of dairy products.   Lyrica 100mg  twice daily for RLS.   Follow up with [name] for RLS and RLS clnical trial.  Please call us with any interim questions, concerns, problems, updates or refill requests.   Please also call us for any test results so we can go over those with you on the phone.  My clinical assistant and will answer any of your questions and relay your messages to me and also relay most of my messages to you.   Our phone number is [phone]. We also have an after hours call service for urgent matters and there is a physician on-call for urgent questions. For any emergencies you know to call 911 or go to the nearest emergency room

## 2014-11-19 NOTE — Progress Notes (Signed)
[facility]    Provider:  Dr Jaynee Eagles Referring Provider: [name], MD Primary Care Physician:  Cody Stack, MD  CC:  RLS  HPI:  Cody Phelps is a 74 y.o. male here as a referral from Dr. Forde Dandy for RLS. PMHx HTN, OSA, DM2, Obesity, Lumbar stenosis.   Left leg feels very restless, if he sits too long he has to move it, starts in the day, picks up more at night. Symptoms started 10 years or more. Getting worse. Been on requip. Takes 1m twice a day. Feels it helps. He took 241monce together and had a reaction, so doesn't want to take more Requip. He has never taken anything elso for us[place]less leg. He has taken iron in the past but does not take it now. He is not aware of the role iron plays in RLS. His wife is with him and provides information, she says his leg moves all night. It is extremely disturbing to him. Getting a lot worse over the last year. Sometimes it starts in the early afternoon to bother him. He can't sit for too long, he has to stand. Even in church he has to stand.    Reviewed notes, labs and imaging from outside physicians, which showed: Ferritin 19.1  Review of Systems: Patient complains of symptoms per HPI as well as the following symptoms: No SOB, No CP. Pertinent negatives per HPI. All others negative.   History   Social History  . Marital Status: Married    Spouse Name: Cody Phelps. Number of Children: 2  . Years of Education: 14   Occupational History  . Not on file.   Social History Main Topics  . Smoking status: Former Smoker -- 1 years    Types: Cigarettes    Quit date: 02/04/1967  . Smokeless tobacco: Never Used     Comment: smoked 1 q2-3 days  . Alcohol Use: Yes     Comment: occas.  . Drug Use: No  . Sexual Activity: Not on file   Other Topics Concern  . Not on file   Social History Narrative   Patient is married (DButch Phelps   Patient has two children.   Patient is retired.   Patient has a college education.   Patient is right-handed.   Patient drinks two cups of coffee per day and 2-3 cups of Diet soda daily and limited tea.             Family History  Problem Relation Age of Onset  . Stroke Mother   . Hypertension Mother   . Heart attack Father   . Heart murmur Father   . Hypertension Sister     Past Medical History  Diagnosis Date  . Hypertension   . Diabetes mellitus   . Diverticulosis   . Heart murmur   . COPD (chronic obstructive pulmonary disease)     bronchitis  . Sleep apnea     uses CPAP  . GERD (gastroesophageal reflux disease)   . Mixed hyperlipidemia   . Aortic valve disorders     mild AS/AR 12/26/13 echo (Dr. GaEinar Gip . OSA on CPAP     Past Surgical History  Procedure Laterality Date  . Appendectomy  1988  . Ankle surgery Right   . Nasal polyp excision    . Tee without cardioversion  03/20/2012    Procedure: TRANSESOPHAGEAL ECHOCARDIOGRAM (TEE);  Surgeon: JaLaverda PageMD;  [place] [facility] Service: Cardiovascular;  Laterality: N/A;  .  Laminectomy with posterior lateral arthrodesis level 2 N/A 02/12/2014    Procedure: LUMBAR TWO-THREE,LUIMBAR THREE-FOUR LAMINECTOMY/FORAMINOTOMY;POSSIBLE POSTEROLATERAL ARTHRODESIS WITH AUTOGRAFT;  Surgeon: [name], MD;  Location: [place] [facility];  Service: Neurosurgery;  Laterality: N/A;  . Nasal septum surgery      Current Outpatient Prescriptions  Medication Sig Dispense Refill  . Amlodipine-Valsartan-HCTZ 10-160-12.5 MG TABS Take 1 tablet by mouth daily.     . colesevelam (WELCHOL) 625 MG tablet Take 1,875 mg by mouth 2 (two) times daily with a meal. Taking 3 tablets two times per day.    . donepezil (ARICEPT) 10 MG tablet Take 10 mg by mouth daily.     Marland Kitchen esomeprazole (NEXIUM) 40 MG capsule Take 40 mg by mouth daily as needed (ACID).     Marland Kitchen ezetimibe-simvastatin (VYTORIN) 10-80 MG per tablet Take 1 tablet by mouth daily.      . insulin glargine (LANTUS SOLOSTAR) 100 UNIT/ML injection Inject 40 Units into the  skin at bedtime.     . nabumetone (RELAFEN) 750 MG tablet Take 750 mg by mouth daily.    Marland Kitchen omega-3 acid ethyl esters (LOVAZA) 1 G capsule Take 2 g by mouth 2 (two) times daily.      . Pioglitazone HCl-Metformin HCl (ACTOPLUS MET XR) 30-1000 MG TB24 Take 1 tablet by mouth daily.    Marland Kitchen rOPINIRole (REQUIP) 1 MG tablet Take 1 mg by mouth 2 (two) times daily.     Marland Kitchen aspirin 81 MG tablet Take 81 mg by mouth daily.    . mometasone (NASONEX) 50 MCG/ACT nasal spray Place 2 sprays into the nose daily as needed (ALLERGIES).    . naproxen sodium (ANAPROX) 220 MG tablet Take 220 mg by mouth as needed.     No current facility-administered medications for this visit.    Allergies as of 11/19/2014  . (No Known Allergies)    Vitals: BP 135/72 mmHg  Pulse 67  Temp(Src) 97.3 F (36.3 C)  Ht _0  (1.676 m)  Wt 233 lb (105.688 kg)  BMI 37.63 kg/m2 Last Weight:  Wt Readings from Last 1 Encounters:  11/19/14 233 lb (105.688 kg)   Last Height:   Ht Readings from Last 1 Encounters:  11/19/14 _1  (1.676 m)   Physical exam: Exam: Gen: NAD, conversant, well nourised, obese, well groomed                     CV: RRR, no MRG. No Carotid Bruits. No peripheral edema, warm, nontender Eyes: Conjunctivae clear without exudates or hemorrhage  Neuro: Detailed Neurologic Exam  Speech:    Speech is normal; fluent and spontaneous with normal comprehension.  Cognition:    The patient is oriented to person, place, and time;     recent and remote memory intact;     language fluent;     normal attention, concentration,     fund of knowledge Cranial Nerves:    The pupils are equal, round, and reactive to light. The fundi are normal and spontaneous venous pulsations are present. Visual fields are full to finger confrontation. Extraocular movements are intact. Trigeminal sensation is intact and the muscles of mastication are normal. The face is symmetric. The palate elevates in the midline. Hearing intact. Voice  is normal. Shoulder shrug is normal. The tongue has normal motion without fasciculations.   Coordination:    Normal finger to nose and heel to shin.   Gait:    Antalgic with a cane  Motor Observation:  No asymmetry, no atrophy, and no involuntary movements noted. Tone:    Normal muscle tone.    Posture:    Posture is normal. normal erect    Strength:    Strength is V/V in the upper and lower limbs.      Sensation: intact to LT     Reflex Exam:  DTR's: hyporeflexic lowers lowers     Toes:    The toes are downgoing bilaterally.   Clonus:    Clonus is absent.     Assessment/Plan:  74 year old with worsening RLS. His Ferritin is 19.1.  1. Because oral iron is easier to administer and safer, we suggest initial therapy with an oral regimen such as ferrous sulfate (325 mg orally two to three times a day). Iron should be combined with vitamin C (100 to 200 mg with each dose of ferrous sulfate) or a glass of orange juice to enhance absorption. Iron should not be taken at the same time as calcium supplements or significant amounts of dairy products.   2. Will start Lyrica twice daily  3. Continue Requip  4. Will refer to [name] for evaluation of inclusion to clinical trial. Will ask my colleague to as a courtesy see if he ia a candidate for IV iron if he does not qualify for the clinical trial.    Sarina Ill, MD  [facility] [address] [place], [place] [address]  Phone [phone] Fax [phone]  A total of 30 minutes was spent face-to-face with this patient. Over half this time was spent on counseling patient on the RLS diagnosis and different diagnostic and therapeutic options available.

## 2014-11-20 DIAGNOSIS — G2581 Restless legs syndrome: Secondary | ICD-10-CM | POA: Insufficient documentation

## 2015-01-12 ENCOUNTER — Ambulatory Visit (INDEPENDENT_AMBULATORY_CARE_PROVIDER_SITE_OTHER): Payer: Medicare Other | Admitting: Neurology

## 2015-01-12 ENCOUNTER — Encounter: Payer: Self-pay | Admitting: Neurology

## 2015-01-12 VITALS — BP 138/64 | HR 74 | Resp 20 | Ht 68.11 in | Wt 231.0 lb

## 2015-01-12 DIAGNOSIS — G253 Myoclonus: Secondary | ICD-10-CM | POA: Diagnosis not present

## 2015-01-12 DIAGNOSIS — D509 Iron deficiency anemia, unspecified: Secondary | ICD-10-CM

## 2015-01-12 DIAGNOSIS — G4761 Periodic limb movement disorder: Secondary | ICD-10-CM

## 2015-01-12 DIAGNOSIS — G2581 Restless legs syndrome: Secondary | ICD-10-CM | POA: Diagnosis not present

## 2015-01-12 NOTE — Progress Notes (Signed)
[facility]    Provider:  Dr.  Brett Fairy , Dr. Jaynee Eagles.  Referring Provider: [name], MD Primary Care Physician:  Sheela Stack, MD  CC:  RLS, CPAP compliance check, possible enrollment in IV iron study.   HPI:  Cody Phelps is a 74 y.o. male here as a referral from Dr. Forde Dandy for RLS.  Has HTN, OSA, DM2, Obesity, Lumbar stenosis. The being seen by my colleague Dr. Sarina Ill. The patient had seen me for a sleep study on 12-06-13 at that time referred by Dr. [name], which documented a high sleepiness score of 16 out of 24 possible points. A BMI of 34.5 at the time and an AHI of 41.8 RDI of 62.7 supine AHI of 45. The patient had no REM sleep in the first 2 hours of sleep he had periodic limb movements which were leading to arousals. ML arousal index was 4.5 the patient was titrated to 10 cm water with a flex function. He is here today because he is compliant with the CPAP and actually has almost no measurable residual apnea. He has a 97% compliance for over 4 hours of daily use on a download dated 01-07-15 average user time is 8 hours and 22 minutes residual AHI at home is 1.2 which is an excellent result. There is no evidence of central apneas. The patient continues to be impaired by restless legs quite recently he is usually a blocked doorknob for the [employer] he was declined or denied to donate blood due to a drop in hemoglobin. It was 11.9 at Dr. Forde Dandy office and 12.0 at the [employer] donation station. Iron levels have not been checked but the patient is taking oral iron at this time in spite of that he was declined. He wonders if he cannot absorb the iron or if he is continuously losing blood else may be the reason for the lower hemoglobin. I am here to evaluate the patient for an enrollment in a possible iron intravenous treatment trial.  The patient has had spinal stenosis , was surgically treated. He likes ot stand and not sit, has still jerking movements. He has RLS  with myoclonus.  No family History of RLS. Requip is taken daily but doesn't help without causing sleepiness.  He had sciatica, but no neuropathy.  I reviewed Dr. lSouth'scomplete metabolic panel,  CBC,  Lipid,  PSA and Hemoglobin  A1c, The deferral  letter from the [facility] donor center, a download for the CPAP machine and the original sleep study data.     Dr. Cathren Laine visit with the patient :  Left leg feels very restless, if he sits too long he has to move it, starts in the day, picks up more at night. Symptoms started 10 years or more. Getting worse. Been on requip. Takes 44m twice a day. Feels it helps. He took 291monce together and had a reaction, so doesn't want to take more Requip. He has never taken anything elso for us[place]less leg. He has taken iron in the past but does not take it now. He is not aware of the role iron plays in RLS. His wife is with him and provides information, she says his leg moves all night. It is extremely disturbing to him. Getting a lot worse over the last year. Sometimes it starts in the early afternoon to bother him. He can't sit for too long,  he has to stand. Even in church he has to stand.   Reviewed  notes, labs and imaging from outside physicians, which showed: Ferritin 19.1  Review of Systems: Patient complains of symptoms per HPI as well as the following symptoms: No SOB, No CP. Pertinent negatives per HPI. All others negative. ROS The patient onset questions on a [facility] restless leg syndrome quality of life questionnaire. He endorsed a moderate to severe impairment.  Epworth 16, FSS 51, jerking movements, irresistible urge to move.     History   Social History  . Marital Status: Married    Spouse Name: Butch Penny  . Number of Children: 2  . Years of Education: 14   Occupational History  . Not on file.   Social History Main Topics  . Smoking status: Former Smoker -- 1 years    Types: Cigarettes    Quit date: 02/04/1967  . Smokeless  tobacco: Never Used     Comment: smoked 1 q2-3 days  . Alcohol Use: Yes     Comment: occas.  . Drug Use: No  . Sexual Activity: Not on file   Other Topics Concern  . Not on file   Social History Narrative   Patient is married Butch Penny).   Patient has two children.   Patient is retired.   Patient has a college education.   Patient is right-handed.   Patient drinks two cups of coffee per day and 2-3 cups of Diet soda daily and limited tea.             Family History  Problem Relation Age of Onset  . Stroke Mother   . Hypertension Mother   . Heart attack Father   . Heart murmur Father   . Hypertension Sister     Past Medical History  Diagnosis Date  . Hypertension   . Diabetes mellitus   . Diverticulosis   . Heart murmur   . COPD (chronic obstructive pulmonary disease)     bronchitis  . Sleep apnea     uses CPAP  . GERD (gastroesophageal reflux disease)   . Mixed hyperlipidemia   . Aortic valve disorders     mild AS/AR 12/26/13 echo (Dr. [name])  . OSA on CPAP     Past Surgical History  Procedure Laterality Date  . Appendectomy  1988  . Ankle surgery Right   . Nasal polyp excision    . Tee without cardioversion  03/20/2012    Procedure: TRANSESOPHAGEAL ECHOCARDIOGRAM (TEE);  Surgeon: Laverda Page, MD;  Location: [facility];  Service: Cardiovascular;  Laterality: N/A;  . Laminectomy with posterior lateral arthrodesis level 2 N/A 02/12/2014    Procedure: LUMBAR TWO-THREE,LUIMBAR THREE-FOUR LAMINECTOMY/FORAMINOTOMY;POSSIBLE POSTEROLATERAL ARTHRODESIS WITH AUTOGRAFT;  Surgeon: [name], MD;  Location: [place] [facility];  Service: Neurosurgery;  Laterality: N/A;  . Nasal septum surgery      Current Outpatient Prescriptions  Medication Sig Dispense Refill  . Amlodipine-Valsartan-HCTZ 10-160-12.5 MG TABS Take 1 tablet by mouth daily.     Marland Kitchen aspirin 81 MG tablet Take 81 mg by mouth daily.    . colesevelam (WELCHOL) 625 MG tablet Take 1,875 mg by mouth 2 (two)  times daily with a meal. Taking 3 tablets two times per day.    . donepezil (ARICEPT) 10 MG tablet Take 10 mg by mouth daily.     Marland Kitchen esomeprazole (NEXIUM) 40 MG capsule Take 40 mg by mouth daily as needed (ACID).     Marland Kitchen ezetimibe-simvastatin (VYTORIN) 10-80 MG per tablet Take 1 tablet by mouth daily.      Marland Kitchen  insulin glargine (LANTUS SOLOSTAR) 100 UNIT/ML injection Inject 40 Units into the skin at bedtime.     . mometasone (NASONEX) 50 MCG/ACT nasal spray Place 2 sprays into the nose daily as needed (ALLERGIES).    . nabumetone (RELAFEN) 750 MG tablet Take 750 mg by mouth daily.    . naproxen sodium (ANAPROX) 220 MG tablet Take 220 mg by mouth as needed.    Marland Kitchen omega-3 acid ethyl esters (LOVAZA) 1 G capsule Take 2 g by mouth 2 (two) times daily.      . Pioglitazone HCl-Metformin HCl (ACTOPLUS MET XR) 30-1000 MG TB24 Take 1 tablet by mouth daily.    . pregabalin (LYRICA) 50 MG capsule Take 2 capsules (100 mg total) by mouth 2 (two) times daily. 120 capsule 2  . rOPINIRole (REQUIP) 1 MG tablet Take 1 mg by mouth 2 (two) times daily.      No current facility-administered medications for this visit.    Allergies as of 01/12/2015  . (No Known Allergies)    Vitals: BP 138/64 mmHg  Pulse 74  Resp 20  Ht 5' 8.11" (1.73 m)  Wt 231 lb (104.781 kg)  BMI 35.01 kg/m2 Last Weight:  Wt Readings from Last 1 Encounters:  01/12/15 231 lb (104.781 kg)   Last Height:   Ht Readings from Last 1 Encounters:  01/12/15 5' 8.11" (1.73 m)   Physical exam: Exam: Gen: NAD, conversant, well nourised, obese, well groomed     Neck circumference is 18 inches, mallompati 3, nasal congestion is mild.  Dental status.                  CV: RRR, no MRG. No Carotid Bruits. No peripheral edema, warm, nontender Eyes: Conjunctivae clear without exudates or hemorrhage Speech; Speech is normal; fluent and spontaneous with normal comprehension.  Cognition: The patient is oriented to person, place, and time;     recent and  remote memory intact;     language fluent;     normal attention, concentration,     fund of knowledge Cranial Nerves: The pupils are equal, round, and reactive to light. The fundi are normal and spontaneous venous pulsations are present. Visual fields are full to finger confrontation. Extraocular movements are intact. Trigeminal sensation is intact and the muscles of mastication are normal. The face is symmetric. The palate elevates in the midline. Hearing intact. Voice is normal. Shoulder shrug is normal. The tongue has normal motion without fasciculations.  Coordination: Normal finger to nose and heel to shin.  Gait: Today without cane.  Motor Observation:  asymmetry of muscle bulk- after spinal stenosis. Left side atrophy gastrocnemius and quadriceps. He has noted weakness of the truncal / abdominal muscle. Frequent left side leg - involuntary movements - noted. Posture is normal. normal erect  Strength: Strength is V/V in the upper and lower limbs.    Sensation: intact to LT     Reflex Exam:DTR's: hyporeflexic     Toes downgoing bilaterally.  Clonus is absent.     Assessment/Plan:  74 year old with worsening RLS. His Ferritin is 19.1.  1. MCI- amnestic 2. continue Lyrica twice daily 3. RLS with oevralp form spinal stenosis, after surgery improved, the symptoms now are purely RLS with myoclonus.   3. Continue Requipuntil we know if he can be enrolled, the same fro Oral Iron.   Ferritin, TIBC and FE/     [name],, MD  [facility] [address] [place], [place] [address]  Phone [phone] Fax [phone]  A total of 30 minutes was spent face-to-face with this patient. Over half this time was spent on counseling patient on the RLS diagnosis and different diagnostic and therapeutic options available.

## 2015-01-12 NOTE — Patient Instructions (Signed)
Restless Legs Syndrome Restless legs syndrome is a movement disorder. It may also be called a sensorimotor disorder.  CAUSES  No one knows what specifically causes restless legs syndrome, but it tends to run in families. It is also more common in people with low iron, in pregnancy, in people who need dialysis, and those with nerve damage (neuropathy).Some medications may make restless legs syndrome worse.Those medications include drugs to treat high blood pressure, some heart conditions, nausea, colds, allergies, and depression. SYMPTOMS Symptoms include uncomfortable sensations in the legs. These leg sensations are worse during periods of inactivity or rest. They are also worse while sitting or lying down. Individuals that have the disorder describe sensations in the legs that feel like:  Pulling.  Drawing.  Crawling.  Worming.  Boring.  Tingling.  Pins and needles.  Prickling.  Pain. The sensations are usually accompanied by an overwhelming urge to move the legs. Sudden muscle jerks may also occur. Movement provides temporary relief from the discomfort. In rare cases, the arms may also be affected. Symptoms may interfere with going to sleep (sleep onset insomnia). Restless legs syndrome may also be related to periodic limb movement disorder (PLMD). PLMD is another more common motor disorder. It also causes interrupted sleep. The symptoms from PLMD usually occur most often when you are awake. TREATMENT  Treatment for restless legs syndrome is symptomatic. This means that the symptoms are treated.   Massage and cold compresses may provide temporary relief.  Walk, stretch, or take a cold or hot bath.  Get regular exercise and a good night's sleep.  Avoid caffeine, alcohol, nicotine, and medications that can make it worse.  Do activities that provide mental stimulation like discussions, needlework, and video games. These may be helpful if you are not able to walk or stretch. Some  medications are effective in relieving the symptoms. However, many of these medications have side effects. Ask your caregiver about medications that may help your symptoms. Correcting iron deficiency may improve symptoms for some patients. Document Released: 07/08/2002 Document Revised: 12/02/2013 Document Reviewed: 10/14/2010 Exit[employer] Patient Information 2015 [employer]. This information is not intended to replace advice given to you by your health care provider. Make sure you discuss any questions you have with your health care provider.  

## 2015-01-13 LAB — IRON AND TIBC
Iron Saturation: 35 % (ref 15–55)
Iron: 143 ug/dL (ref 38–169)
Total Iron Binding Capacity: 407 ug/dL (ref 250–450)
UIBC: 264 ug/dL (ref 111–343)

## 2015-01-13 LAB — FERRITIN: Ferritin: 53 ng/mL (ref 30–400)

## 2015-01-15 ENCOUNTER — Telehealth: Payer: Self-pay

## 2015-01-15 NOTE — Telephone Encounter (Signed)
Spoke to pt's wife (per DPR) and informed her that pt's iron labs were normal. Pt's wife verbalized understanding.

## 2015-02-24 ENCOUNTER — Encounter: Payer: Self-pay | Admitting: Neurology

## 2015-02-24 ENCOUNTER — Ambulatory Visit (INDEPENDENT_AMBULATORY_CARE_PROVIDER_SITE_OTHER): Payer: Medicare Other | Admitting: Neurology

## 2015-02-24 VITALS — BP 150/62 | HR 78 | Resp 20 | Ht 68.0 in | Wt 232.0 lb

## 2015-02-24 DIAGNOSIS — G2581 Restless legs syndrome: Secondary | ICD-10-CM

## 2015-02-24 NOTE — Progress Notes (Signed)
[facility]    Provider:  Dr.  Brett Fairy , Dr. Jaynee Eagles.  Referring Provider: [name], MD Primary Care Physician:  Cody Stack, MD  CC:  RLS, CPAP compliance check, possible enrollment in IV iron study.   HPI:  Cody Phelps is a 74 y.o. male here as a referral from Dr. Forde Phelps for RLS.  Has HTN, OSA, DM2, Obesity, Lumbar stenosis. The being seen by my colleague Dr. Sarina Ill. The patient had seen me for a sleep study on 12-06-13 at that time referred by Dr. Einar Phelps, which documented a high sleepiness score of 16 out of 24 possible points. A BMI of 34.5 at the time and an AHI of 41.8 RDI of 62.7 supine AHI of 45. The patient had no REM sleep in the first 2 hours of sleep he had periodic limb movements which were leading to arousals. ML arousal index was 4.5 the patient was titrated to 10 cm water with a flex function. He is here today because he is compliant with the CPAP and actually has almost no measurable residual apnea. He has a 97% compliance for over 4 hours of daily use on a download dated 01-07-15 average user time is 8 hours and 22 minutes residual AHI at home is 1.2 which is an excellent result. There is no evidence of central apneas. The patient continues to be impaired by restless legs quite recently he is usually a blocked doorknob for the [employer] he was declined or denied to donate blood due to a drop in hemoglobin. It was 11.9 at Dr. Forde Phelps office and 12.0 at the [employer] donation station. Iron levels have not been checked but the patient is taking oral iron at this time in spite of that he was declined. He wonders if he cannot absorb the iron or if he is continuously losing blood else may be the reason for the lower hemoglobin. I am here to evaluate the patient for an enrollment in a possible iron intravenous treatment trial.  The patient has had spinal stenosis , was surgically treated. He likes ot stand and not sit, has still jerking movements. He has RLS  with myoclonus.  No family History of RLS. Requip is taken daily but doesn't help without causing sleepiness.  He had sciatica, but no neuropathy. I reviewed Dr. Baldwin Crown complete metabolic panel,  CBC,  Lipid,  PSA and Hemoglobin  A1c, The deferral  letter from the [facility] donor center, a download for the CPAP machine and the original sleep study data.   Interval history from 7-20 6-16 [name] is here today for his regular follow-up which includes restless legs, memory function and obstructive sleep apnea on CPAP compliance. The patient has used his CPAP 100% of the time for the last 30 days and each day over 4 hours. The average user time is 8 hours and 23 minutes the set pressure is 10 cm water was to send the EPR and the residual AHI is 1.7 from the CPAP standpoint that needs to be no adjustments made. In the workup for restless legs iron levels were drawn and the patient had a high iron saturation 35% a total iron binding capacity of 407 and a ferritin level of 53 just borderline low. The patient was turned away from a blood donation for his iron deficiency anemia at the time. Since he has been starting to take iron orally he has had less myoclonus and less restless legs and PLM's. Ferritin level was 53  ng/mL The patient so may be able to enroll in the restless leg I will and trial period for this however I would have to ask him to take no oral iron anymore for the duration of the trial and I will have one of my nurses explained the possibility of participation to him. He still has irresistible urge to move, he is also on a low-dose of Lyrica. Memory testing was performed , MOCA was 30 out of 30 points, fluency test 11 clock drawing intact tremor making intact daily was copied perfectly. Serial sevens intact. He still has difficulties finding his way in traffic.  He has no neuropathic pain. He has restless legs with myoclonus. Dr. Joya Salm looked at images of his lower back and found no post operative  evidence after laminectomy.   Dr. Cathren Laine visit with the patient :  Left leg feels very restless, if he sits too long he has to move it, starts in the day, picks up more at night. Symptoms started 10 years or more. Getting worse. Been on requip. Takes 41m twice a day. Feels it helps. He took 216monce together and had a reaction, so doesn't want to take more Requip. He has never taken anything elso for [place] leg. He has taken iron in the past but does not take it now. He is not aware of the role iron plays in RLS. His wife is with him and provides information, she says his leg moves all night. It is extremely disturbing to him. Getting a lot worse over the last year. Sometimes it starts in the early afternoon to bother him. He can't sit for too long he has to stand. Even in church he has to stand. Reviewed notes, labs and imaging from outside physicians, which showed: Ferritin was 19.1.    Review of Systems: Patient complains of symptoms per HPI as well as the following symptoms: No SOB, No CP. Pertinent negatives per HPI. All others negative. ROS The patient onset questions on a [facility] restless leg syndrome quality of life questionnaire. He endorsed a moderate to severe impairment.  Epworth 16, FSS 51, jerking movements, irresistible urge to move.     History   Social History  . Marital Status: Married    Spouse Name: DoButch Phelps. Number of Children: 2  . Years of Education: 14   Occupational History  . Not on file.   Social History Main Topics  . Smoking status: Former Smoker -- 1 years    Types: Cigarettes    Quit date: 02/04/1967  . Smokeless tobacco: Never Used     Comment: smoked 1 q2-3 days  . Alcohol Use: Yes     Comment: occas.  . Drug Use: No  . Sexual Activity: Not on file   Other Topics Concern  . Not on file   Social History Narrative   Patient is married (DButch Phelps   Patient has two children.   Patient is retired.   Patient has a college education.    Patient is right-handed.   Patient drinks two cups of coffee per day and 2-3 cups of Diet soda daily and limited tea.             Family History  Problem Relation Age of Onset  . Stroke Mother   . Hypertension Mother   . Heart attack Father   . Heart murmur Father   . Hypertension Sister     Past Medical History  Diagnosis Date  . Hypertension   .  Diabetes mellitus   . Diverticulosis   . Heart murmur   . COPD (chronic obstructive pulmonary disease)     bronchitis  . Sleep apnea     uses CPAP  . GERD (gastroesophageal reflux disease)   . Mixed hyperlipidemia   . Aortic valve disorders     mild AS/AR 12/26/13 echo (Dr. Einar Phelps)  . OSA on CPAP     Past Surgical History  Procedure Laterality Date  . Appendectomy  1988  . Ankle surgery Right   . Nasal polyp excision    . Tee without cardioversion  03/20/2012    Procedure: TRANSESOPHAGEAL ECHOCARDIOGRAM (TEE);  Surgeon: Laverda Page, MD;  Location: [place];  Service: Cardiovascular;  Laterality: N/A;  . Laminectomy with posterior lateral arthrodesis level 2 N/A 02/12/2014    Procedure: LUMBAR TWO-THREE,LUIMBAR THREE-FOUR LAMINECTOMY/FORAMINOTOMY;POSSIBLE POSTEROLATERAL ARTHRODESIS WITH AUTOGRAFT;  Surgeon: [name], MD;  Location: [facility];  Service: Neurosurgery;  Laterality: N/A;  . Nasal septum surgery      Current Outpatient Prescriptions  Medication Sig Dispense Refill  . Amlodipine-Valsartan-HCTZ 10-160-12.5 MG TABS Take 1 tablet by mouth daily.     Marland Kitchen aspirin 81 MG tablet Take 81 mg by mouth daily.    . colesevelam (WELCHOL) 625 MG tablet Take 1,875 mg by mouth 2 (two) times daily with a meal. Taking 3 tablets two times per day.    . donepezil (ARICEPT) 10 MG tablet Take 10 mg by mouth daily.     Marland Kitchen esomeprazole (NEXIUM) 40 MG capsule Take 40 mg by mouth daily as needed (ACID).     Marland Kitchen ezetimibe-simvastatin (VYTORIN) 10-80 MG per tablet Take 1 tablet by mouth daily.      . insulin glargine (LANTUS  SOLOSTAR) 100 UNIT/ML injection Inject 40 Units into the skin at bedtime.     . mometasone (NASONEX) 50 MCG/ACT nasal spray Place 2 sprays into the nose daily as needed (ALLERGIES).    . nabumetone (RELAFEN) 750 MG tablet Take 750 mg by mouth daily.    . naproxen sodium (ANAPROX) 220 MG tablet Take 220 mg by mouth as needed.    Marland Kitchen omega-3 acid ethyl esters (LOVAZA) 1 G capsule Take 2 g by mouth 2 (two) times daily.      . Pioglitazone HCl-Metformin HCl (ACTOPLUS MET XR) 30-1000 MG TB24 Take 1 tablet by mouth daily.    . pregabalin (LYRICA) 50 MG capsule Take 2 capsules (100 mg total) by mouth 2 (two) times daily. 120 capsule 2  . rOPINIRole (REQUIP) 1 MG tablet Take 1 mg by mouth 2 (two) times daily.      No current facility-administered medications for this visit.    Allergies as of 02/24/2015  . (No Known Allergies)    Vitals: BP 150/62 mmHg  Pulse 78  Resp 20  Ht _0  (1.727 m)  Wt 232 lb (105.235 kg)  BMI 35.28 kg/m2 Last Weight:  Wt Readings from Last 1 Encounters:  02/24/15 232 lb (105.235 kg)   Last Height:   Ht Readings from Last 1 Encounters:  02/24/15 _1  (1.727 m)   Physical exam: Exam: Gen: NAD, conversant, well nourised, obese, well groomed     Neck circumference is 18 inches, mallompati 3, nasal congestion is mild.   Dental status is stable..                  CV: RRR, no MRG. No Carotid Bruits. No peripheral edema, warm, nontender Eyes: Conjunctivae clear without exudates  or hemorrhage Speech; Speech is normal; fluent and spontaneous with normal comprehension.  Cognition: The patient is oriented to person, place, and time;     recent and remote memory intact;     language fluent;     normal attention, concentration,     fund of knowledge Cranial Nerves: The pupils are equal, round, and reactive to light. The fundi are normal and spontaneous venous pulsations are present. Visual fields are full to finger confrontation. Extraocular movements are intact.  Trigeminal sensation is intact and the muscles of mastication are normal. The face is symmetric. The palate elevates in the midline. Hearing intact. Voice is normal. Shoulder shrug is normal. The tongue has normal motion without fasciculations.  Coordination: Normal finger to nose and heel to shin.  Gait: Today without cane.  Motor Observation:  asymmetry of muscle bulk- after spinal stenosis. Left side atrophy gastrocnemius and quadriceps. He has noted weakness of the truncal / abdominal muscle. Frequent left side leg - involuntary movements - noted. Posture is normal. normal erect .Marland Kitchen  Strength: Strength is V/V in the upper and lower limbs.    Sensation: intact to LT     Reflex Exam:DTR's: hyporeflexic     Toes downgoing bilaterally.  Clonus is absent.     Assessment/Plan:  74 year old with worsening RLS. His Ferritin is 19.1.  1. MCI- amnestic 2. continue Lyrica twice daily 3. RLS with oevralp form spinal stenosis, after surgery improved, the symptoms now are purely RLS with myoclonus.   3. Continue Requipuntil we know if he can be enrolled, the same fro Oral Iron.   Ferritin, TIBC and FE/     [name],, MD  [facility] [address] [place], [place] [address]  Phone [phone] Fax [phone]  A total of 30 minutes was spent face-to-face with this patient. Over half this time was spent on counseling patient on the RLS diagnosis and different diagnostic and therapeutic options available.

## 2015-05-12 ENCOUNTER — Telehealth: Payer: Self-pay | Admitting: Neurology

## 2015-05-12 NOTE — Telephone Encounter (Signed)
Message sent to [facility] to reschedule.

## 2015-05-12 NOTE — Telephone Encounter (Signed)
Patient called to reschedule Restless Leg Study with Teodora and Dr. Brett Fairy. Please call to schedule [phone].

## 2015-05-18 ENCOUNTER — Telehealth: Payer: Self-pay | Admitting: Neurology

## 2015-05-18 NOTE — Telephone Encounter (Signed)
Research visit with Renato Gails, 05-18-15. The patient remained on 2 medications for the treatment of restless leg symptoms. He took 50 mg of Lyrica 2 pills daily total daily dose of 100 mg as well as Requip between 1 and 2 mg daily. The patient was asked to discontinue the Lyrica today at this low dose there should be no wean running process necessary. He will remain on a stable dose of Requip for the next 8 weeks. Patient denies any history of painful neuropathy. Patient confirms that he had restless leg symptoms while being on Lyrica. He feels that his stronger relief is given by Requip area did he is so was taken off oral iron supplements. Revisit in 8 weeks.

## 2017-03-28 ENCOUNTER — Other Ambulatory Visit: Payer: Self-pay | Admitting: Urology

## 2017-03-28 DIAGNOSIS — C61 Malignant neoplasm of prostate: Secondary | ICD-10-CM

## 2017-04-04 ENCOUNTER — Encounter (HOSPITAL_COMMUNITY)
Admission: RE | Admit: 2017-04-04 | Discharge: 2017-04-04 | Disposition: A | Discharge status: Home / Self Care | Payer: Medicare Other | Source: Ambulatory Visit | Attending: Urology | Admitting: Urology

## 2017-04-04 DIAGNOSIS — C61 Malignant neoplasm of prostate: Secondary | ICD-10-CM | POA: Diagnosis not present

## 2017-04-04 MED ORDER — TECHNETIUM TC 99M MEDRONATE IV KIT
21.5000 | PACK | Freq: Once | INTRAVENOUS | Status: AC | PRN
  Administered 2017-04-04: 21.5 via INTRAVENOUS

## 2017-04-17 ENCOUNTER — Encounter: Payer: Self-pay | Admitting: Medical Oncology

## 2017-04-18 ENCOUNTER — Telehealth: Payer: Self-pay | Admitting: Medical Oncology

## 2017-04-18 NOTE — Telephone Encounter (Signed)
I called pt to introduce myself as the Prostate Nurse Navigator and the Coordinator of the Prostate [place].  1. I confirmed with the patient he is aware of his referral to the clinic 9/'25/18 arriving at 12:30pm.   2. I discussed the format of the clinic and the physicians he will be seeing that day.  3. I discussed where the clinic is located and how to contact me. I discussed the registration process and [place] parking.  4. I confirmed his address and informed him I would be mailing a packet of information and forms to be completed. I asked him to bring them with him the day of his appointment.   He voiced understanding of the above. I asked him to call me if he has any questions or concerns regarding his appointments or the forms he needs to complete.

## 2017-04-21 DIAGNOSIS — C61 Malignant neoplasm of prostate: Secondary | ICD-10-CM | POA: Insufficient documentation

## 2017-04-21 NOTE — Progress Notes (Signed)
Radiation Oncology         [phone] ________________________________  Multidisciplinary Prostate Cancer Clinic  Initial Radiation Oncology Consultation  Name: Cody Phelps MRN: [id number]  Date: 04/25/2017  DOB: 19-Dec-1940  BM:WUXLK, [name], MD  Raynelle Bring, MD   REFERRING PHYSICIAN: Raynelle Bring, MD  DIAGNOSIS: 76 y.o. 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 Malignant neoplasm of prostate ([place]) C61     HISTORY OF PRESENT ILLNESS::Cody Phelps is a 76 y.o. gentleman.  He was noted to have an elevated PSA of 4.43 on 01/17/17 by his primary care physician, Dr. Forde Dandy.  Accordingly, he was referred for evaluation in urology by Dr. Sharin Grave in 01/2017,  digital rectal examination was performed at that time revealing a hard, indurated right lobe as compared to the left.  The patient proceeded to transrectal ultrasound with 12 biopsies of the prostate on 03/17/17 with Dr. Link Snuffer at [facility].  The prostate volume measured 32.32 cc.  Out of 12 core biopsies,6 were positive, all involving the right lobe.  The maximum Gleason score was 4+4, and this was seen in the right base. All other right lobe core biopsies were Gleason 4+3.   CT A/P and Bone Scan were performed on 04/04/17 for disease staging.  Both studies were negative for metastatic disease.  His PMH is significant for aortic stenosis, HTN and Type II DM.  The patient reviewed the biopsy results with his urologist and he has kindly been referred today to the multidisciplinary prostate cancer clinic for presentation of pathology and radiology studies in our conference for discussion of potential radiation treatment options and clinical evaluation.  He is here today accompanied by his wife and two daughters.  PREVIOUS RADIATION THERAPY: No  PAST MEDICAL HISTORY:  has a past medical history of Aortic valve disorders; COPD (chronic obstructive  pulmonary disease) ([place]); Diabetes mellitus; Diverticulosis; GERD (gastroesophageal reflux disease); Heart murmur; Hypertension; Mixed hyperlipidemia; OSA on CPAP; Prostate cancer ([place]); and Sleep apnea.    PAST SURGICAL HISTORY: Past Surgical History:  Procedure Laterality Date  . ANKLE SURGERY Right   . APPENDECTOMY  1988  . LAMINECTOMY WITH POSTERIOR LATERAL ARTHRODESIS LEVEL 2 N/A 02/12/2014   Procedure: LUMBAR TWO-THREE,LUIMBAR THREE-FOUR LAMINECTOMY/FORAMINOTOMY;POSSIBLE POSTEROLATERAL ARTHRODESIS WITH AUTOGRAFT;  Surgeon: [name], MD;  Location: [facility];  Service: Neurosurgery;  Laterality: N/A;  . NASAL POLYP EXCISION    . NASAL SEPTUM SURGERY    . PROSTATE BIOPSY    . TEE WITHOUT CARDIOVERSION  03/20/2012   Procedure: TRANSESOPHAGEAL ECHOCARDIOGRAM (TEE);  Surgeon: Laverda Page, MD;  Location: [facility];  Service: Cardiovascular;  Laterality: N/A;    FAMILY HISTORY: family history includes Heart attack in his father; Heart murmur in his father; Hypertension in his mother and sister; Stroke in his mother.  SOCIAL HISTORY:  reports that he quit smoking about 50 years ago. His smoking use included Cigarettes. He quit after 1.00 year of use. He has never used smokeless tobacco. He reports that he drinks alcohol. He reports that he does not use drugs.  ALLERGIES: Patient has no known allergies.  MEDICATIONS:  Current Outpatient Prescriptions  Medication Sig Dispense Refill  . Amlodipine-Valsartan-HCTZ 10-160-12.5 MG TABS Take 1 tablet by mouth daily.     Marland Kitchen aspirin 81 MG tablet Take 81 mg by mouth daily.    . colesevelam (WELCHOL) 625 MG tablet Take 1,875  mg by mouth 2 (two) times daily with a meal. Taking 3 tablets two times per day.    . donepezil (ARICEPT) 10 MG tablet Take 10 mg by mouth daily.     . insulin glargine (LANTUS SOLOSTAR) 100 UNIT/ML injection Inject 40 Units into the skin at bedtime.     . naproxen sodium (ANAPROX) 220 MG tablet Take 220 mg by  mouth as needed.    . Pioglitazone HCl-Metformin HCl (ACTOPLUS MET XR) 30-1000 MG TB24 Take 1 tablet by mouth daily.    Marland Kitchen rOPINIRole (REQUIP) 1 MG tablet Take 1 mg by mouth 2 (two) times daily.     . sildenafil (REVATIO) 20 MG tablet Take 20 mg by mouth 3 (three) times daily.    . tamsulosin (FLOMAX) 0.4 MG CAPS capsule Take 0.4 mg by mouth.    . esomeprazole (NEXIUM) 40 MG capsule Take 40 mg by mouth daily as needed (ACID).     Marland Kitchen ezetimibe-simvastatin (VYTORIN) 10-80 MG per tablet Take 1 tablet by mouth daily.      . mometasone (NASONEX) 50 MCG/ACT nasal spray Place 2 sprays into the nose daily as needed (ALLERGIES).    . nabumetone (RELAFEN) 750 MG tablet Take 750 mg by mouth daily.    Marland Kitchen omega-3 acid ethyl esters (LOVAZA) 1 G capsule Take 2 g by mouth 2 (two) times daily.      . pregabalin (LYRICA) 50 MG capsule Take 2 capsules (100 mg total) by mouth 2 (two) times daily. (Patient not taking: Reported on 04/25/2017) 120 capsule 2   No current facility-administered medications for this encounter.     REVIEW OF SYSTEMS:  On review of systems, the patient reports that he is doing well overall. He denies any chest pain, shortness of breath, cough, fevers, chills, night sweats, unintended weight changes. He denies any bowel disturbances, and denies abdominal pain, nausea or vomiting. He denies any new musculoskeletal or joint aches or pains. His IPSS was 12, indicating moderate urinary symptoms with frequency, urgency and nocturia x2/night. He takes Flomax daily which has improved his LUTS tremendously.  He is unable to complete sexual activity with most attempts but has had some success with the use of Viagra prn. SHIM score is 5.  A complete review of systems is obtained and is otherwise negative.   PHYSICAL EXAM:  Wt Readings from Last 3 Encounters:  02/24/15 232 lb (105.2 kg)  01/12/15 231 lb (104.8 kg)  11/19/14 233 lb (105.7 kg)   Temp Readings from Last 3 Encounters:  11/19/14 97.3 F  (36.3 C)  02/17/14 97.8 F (36.6 C) (Oral)  02/03/14 98.3 F (36.8 C)   BP Readings from Last 3 Encounters:  02/24/15 (!) 150/62  01/12/15 138/64  11/19/14 135/72   Pulse Readings from Last 3 Encounters:  02/24/15 78  01/12/15 74  11/19/14 67   In general this is a well appearing caucasian male in no acute distress. He is alert and oriented x4 and appropriate throughout the examination. HEENT reveals that the patient is normocephalic, atraumatic. EOMs are intact. PERRLA. Skin is intact without any evidence of gross lesions. Cardiovascular exam reveals a regular rate and rhythm, no clicks rubs or murmurs are auscultated. Chest is clear to auscultation bilaterally. Lymphatic assessment is performed and does not reveal any adenopathy in the cervical, supraclavicular, axillary, or inguinal chains. Abdomen has active bowel sounds in all quadrants and is intact. The abdomen is soft, non tender, non distended. Lower extremities are negative for pretibial  pitting edema, deep calf tenderness, cyanosis or clubbing.  KPS = 90  100 - Normal; no complaints; no evidence of disease. 90   - Able to carry on normal activity; minor signs or symptoms of disease. 80   - Normal activity with effort; some signs or symptoms of disease. 76   - Cares for self; unable to carry on normal activity or to do active work. 60   - Requires occasional assistance, but is able to care for most of his personal needs. 50   - Requires considerable assistance and frequent medical care. 52   - Disabled; requires special care and assistance. 77   - Severely disabled; hospital admission is indicated although death not imminent. 75   - Very sick; hospital admission necessary; active supportive treatment necessary. 10   - Moribund; fatal processes progressing rapidly. 0     - Dead  Karnofsky DA, Abelmann [place], Craver LS and Burchenal [facility] [phone]) The use of the nitrogen mustards in the palliative treatment of carcinoma: with  particular reference to bronchogenic carcinoma Cancer 1 [id number]   LABORATORY DATA:  Lab Results  Component Value Date   WBC 5.6 02/03/2014   HGB 12.3 (L) 02/03/2014   HCT 37.1 (L) 02/03/2014   MCV 87.7 02/03/2014   PLT 247 02/03/2014   Lab Results  Component Value Date   NA 141 02/03/2014   K 3.8 02/03/2014   CL 102 02/03/2014   CO2 24 02/03/2014   Lab Results  Component Value Date   ALT 28 01/07/2009   AST 20 01/07/2009   ALKPHOS 40 01/07/2009   BILITOT 0.6 01/07/2009     RADIOGRAPHY: Nm Bone Scan Whole Body  Result Date: 04/04/2017 CLINICAL DATA:  Low back pain for several years. History of prostate cancer. EXAM: NUCLEAR MEDICINE WHOLE BODY BONE SCAN TECHNIQUE: Whole body anterior and posterior images were obtained approximately 3 hours after intravenous injection of radiopharmaceutical. RADIOPHARMACEUTICALS:  21.5 mCi Technetium-101mMDP IV COMPARISON:  CT of the abdomen pelvis from 04/04/2017 FINDINGS: There is new degenerative type changes identified within the lower thoracic and mid lumbar spine. This corresponds to CT findings. Mild degenerative changes are noted within the medial compartment of the left knee and the patellofemoral compartment of the right knee. There is also degenerative changes identified within knee right ankle and right great toe. No abnormal foci of increased radiotracer uptake identified to suggest bone metastases. IMPRESSION: 1. No evidence of metastatic disease. 2. Degenerative changes as detailed above. Electronically Signed   By: TKerby MoorsM.D.   On: 04/04/2017 14:36      IMPRESSION/PLAN: 76y.o.  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  We discussed the patient's workup and outlines the nature of prostate cancer in this setting. The patient's T stage, Gleason's score, and PSA put him into the high risk group. Accordingly, he is eligible for a variety of potential treatment options  including brachytherapy, 8 weeks of external radiation or 5 weeks of external radiation with an up-front brachytherapy boost. We discussed the available radiation techniques, and focused on the details and logistics and delivery. We discussed and outlined the risks, benefits, short and long-term effects associated with radiotherapy and compared and contrasted these with prostatectomy. We discussed the role of SpaceOAR in reducing the rectal toxicity associated with radiotherapy. We also detailed the role of ADT in the treatment of high risk prostate cancer and outlined the associated side  effects that could be expected with this therapy. We would recommend ADT in combination with radiotherapy and anticipate beginning radiotherapy approximately 2 months after initiation of ADT.  At the end of the conversation the patient is interested in proceeding with 5 weeks of external beam therapy with brachytherapy boost. He has not received his first Lupron injection and has not had placement of gold fiducial markers. We will contact [facility] to make arrangements for starting ADT in the near future. We will arrange for fiducial marker placement, brachytherapy and SpaceOAR injection as an outpatient surgical procedure after 2 months of ADT. He will be scheduled for a follow up visit with CT simulation 2-3 weeks after his procedure in preparation for beginning prostate EBRT. We will coordinate a prostate MRI to follow his CT Simulation for verification of SpaceOAR distribution and these images will be fused for planning purposes.  At the patient's request, we will share our discussion with Dr. Pilar Jarvis and move forward with scheduling a follow up visit to initiate ADT in the near future.  We spent 60 minutes face to face with the patient and more than 50% of that time was spent in counseling and/or coordination of care.     Nicholos Johns, PA-C    Tyler Pita, MD  [facility]  Dial: [phone]  Fax: [phone] [email or web address]  Skype  LinkedIn    Page Me

## 2017-04-24 ENCOUNTER — Telehealth: Payer: Self-pay | Admitting: Medical Oncology

## 2017-04-24 ENCOUNTER — Encounter: Payer: Self-pay | Admitting: Genetics

## 2017-04-24 NOTE — Progress Notes (Signed)
Called [facility] to request biopsy pathology slides for the [facility].

## 2017-04-24 NOTE — Telephone Encounter (Signed)
Spoke with Cody Phelps to confirm appointment for the [facility]. I reviewed the location of [place], registration and [place] parking. I asked him to have lunch before arrival due to the length of the clinic. He had several questions regarding surgery. I answered these and explained that Dr. Alinda Money will discuss with him in depth tomorrow. He voiced understanding of the above.

## 2017-04-25 ENCOUNTER — Encounter: Payer: Self-pay | Admitting: Medical Oncology

## 2017-04-25 ENCOUNTER — Ambulatory Visit (HOSPITAL_BASED_OUTPATIENT_CLINIC_OR_DEPARTMENT_OTHER): Payer: Medicare Other | Admitting: Oncology

## 2017-04-25 ENCOUNTER — Ambulatory Visit
Admission: RE | Admit: 2017-04-25 | Discharge: 2017-04-25 | Disposition: A | Discharge status: Home / Self Care | Payer: Medicare Other | Source: Ambulatory Visit | Attending: Radiation Oncology | Admitting: Radiation Oncology

## 2017-04-25 ENCOUNTER — Encounter: Payer: Self-pay | Admitting: Radiation Oncology

## 2017-04-25 DIAGNOSIS — I350 Nonrheumatic aortic (valve) stenosis: Secondary | ICD-10-CM | POA: Diagnosis not present

## 2017-04-25 DIAGNOSIS — E785 Hyperlipidemia, unspecified: Secondary | ICD-10-CM | POA: Insufficient documentation

## 2017-04-25 DIAGNOSIS — I10 Essential (primary) hypertension: Secondary | ICD-10-CM | POA: Diagnosis not present

## 2017-04-25 DIAGNOSIS — G4730 Sleep apnea, unspecified: Secondary | ICD-10-CM | POA: Insufficient documentation

## 2017-04-25 DIAGNOSIS — Z79899 Other long term (current) drug therapy: Secondary | ICD-10-CM | POA: Diagnosis not present

## 2017-04-25 DIAGNOSIS — C61 Malignant neoplasm of prostate: Secondary | ICD-10-CM | POA: Diagnosis not present

## 2017-04-25 DIAGNOSIS — Z794 Long term (current) use of insulin: Secondary | ICD-10-CM | POA: Diagnosis not present

## 2017-04-25 DIAGNOSIS — K219 Gastro-esophageal reflux disease without esophagitis: Secondary | ICD-10-CM | POA: Insufficient documentation

## 2017-04-25 DIAGNOSIS — E119 Type 2 diabetes mellitus without complications: Secondary | ICD-10-CM | POA: Diagnosis not present

## 2017-04-25 HISTORY — DX: Malignant neoplasm of prostate: C61

## 2017-04-25 NOTE — Consult Note (Signed)
[facility]     04/25/2017   --------------------------------------------------------------------------------   Cody Phelps  MRN: [id number]  PRIMARY CARE:  Ishmael Holter. Forde Dandy, MD  DOB: 1940/08/14, 76 year old Male  REFERRING:  Wonda Cheng. Williams Che,   SSN:   PROVIDER:  Wendie Simmer, M.D.    TREATING:  Raynelle Bring, M.D.    LOCATION:  [facility] [phone]   --------------------------------------------------------------------------------   CC/HPI: CC: Prostate Cancer   Physician requesting consult: Dr. Lafayette Dragon  PCP: Dr. [name]  Location of consult: [facility] - Prostate Cancer Multidisciplinary [facility]   Cody Phelps is a 76 year old gentleman who was noted to have an elevated PSA of 4.43 and right sided prostate induration. This prompted a TRUS biopsy of the prostate on 03/17/17 that indicated Gleason 4+4=8 adenocarcinoma with 6 out of 12 biopsy cores positive for malignancy (all on the right side).   Family history: None.   Imaging studies:  Bone scan (04/04/17) - Negative.  CT (04/04/17) - Negative.   PMH: He has a history of diabetes, hypertension, hyperlipidemia, GERD, and sleep apnea. He also has a history of aortic stenosis diagnosed approximately 5 years ago. This is being followed by Dr. [name]. Trying to rush finish up.  PSH: Appendectomy.   TNM stage: cT2b N0 M0  PSA: 4.43  Gleason score: 4+4=8  Biopsy (03/17/17): 6/12 cores positive  Left: Benign  Right: R apex (60%, 4+3=7), R lateral apex (50%, 4+3=7), R mid (80%, 4+3=7), R lateral mid (95%, 4+3=7), R base (50%, 4+4=8), R lateral base (90%, 4+3=7)  Prostate volume: 32.3 cc   Nomogram  OC disease: 20%  EPE: 78%  SVI: 27%  LNI: 25%  PFS (5 year, 10 year): 38%,25%   Urinary function: IPSS is 12.  Erectile function: SHIM score is 5.     ALLERGIES: None   MEDICATIONS: Flomax 0.4 mg capsule, ext release 24 hr 1 capsule PO Q HS  Actoplus Met   Amlodipine-Valsartan-Hctz 10 mg-160 mg-12.5 mg tablet  Donepezil Hcl 10 mg tablet  Lantus  Omega-3 Acid Ethyl Esters 1 PO Daily  Ropinirole Hcl 1 mg tablet  Sildenafil 20 mg tablet Take 1 to 5 tabs PO daily prn  Welchol 625 mg tablet     GU PSH: Locm 300-399Mg/Ml Iodine,1Ml - 04/04/2017 Prostate Needle Biopsy - 03/17/2017      PSH Notes: Ankle Surgery   NON-GU PSH: Appendectomy Back surgery - about 2015 Carpal tunnel surgery Surgical Pathology, Gross And Microscopic Examination For Prostate Needle - 03/17/2017    GU PMH: Prostate Cancer - 04/11/2017, - 03/24/2017 Elevated PSA - 03/17/2017 BPH w/o LUTS - 02/21/2017 ED due to arterial insufficiency - 02/21/2017 Encounter for Prostate Cancer screening - 02/21/2017    NON-GU PMH: Aortic stenosis Diabetes Type 2 Heartburn Hypercholesterolemia Hypertension Sleep Apnea    FAMILY HISTORY: 2 daughters - Daughter Heart Disease - Father stroke - Mother   SOCIAL HISTORY: Marital Status: Married Preferred Language: English; Ethnicity: Not Hispanic Or Latino; Race: White Current Smoking Status: Patient has never smoked.   Tobacco Use Assessment Completed: Used Tobacco in last 30 days? Drinks 3 caffeinated drinks per day. Patient's occupation is/was Retired.    REVIEW OF SYSTEMS:    GU Review Male:   Patient denies frequent urination, hard to postpone urination, burning/ pain with urination, get up at night to urinate, leakage of urine, stream starts and stops, trouble starting your streams, and have to strain to  urinate .  Gastrointestinal (Upper):   Patient denies nausea and vomiting.  Gastrointestinal (Lower):   Patient denies diarrhea and constipation.  Constitutional:   Patient denies fever, night sweats, weight loss, and fatigue.  Skin:   Patient denies skin rash/ lesion and itching.  Eyes:   Patient denies blurred vision and double vision.  Ears/ Nose/ Throat:   Patient denies sore throat and sinus problems.   Hematologic/Lymphatic:   Patient denies swollen glands and easy bruising.  Cardiovascular:   Patient denies leg swelling and chest pains.  Respiratory:   Patient denies cough and shortness of breath.  Endocrine:   Patient denies excessive thirst.  Musculoskeletal:   Patient denies back pain and joint pain.  Neurological:   Patient denies headaches and dizziness.  Psychologic:   Patient denies depression and anxiety.   VITAL SIGNS: None   GU PHYSICAL EXAMINATION:    Prostate: 50 cc. He does have significant induration of the right side of the prostate particularly the mid and apical portion of the right prostate. Although I cannot definitively say that he has extraprostatic extension, there is certainly suspicion that this may be the case.   MULTI-SYSTEM PHYSICAL EXAMINATION:    Constitutional: Well-nourished. No physical deformities. Normally developed. Good grooming.  Neck: Neck symmetrical, not swollen. Normal tracheal position.  Respiratory: No labored breathing, no use of accessory muscles. Clear bilaterally.  Cardiovascular: Normal temperature, normal extremity pulses, no swelling, no varicosities. Regular rate and rhythm.  Lymphatic: No enlargement of neck, axillae, groin.  Skin: No paleness, no jaundice, no cyanosis. No lesion, no ulcer, no rash.  Neurologic / Psychiatric: Oriented to time, oriented to place, oriented to person. No depression, no anxiety, no agitation.  Gastrointestinal: No mass, no tenderness, no rigidity, obese abdomen. Well-healed right lower quadrant scar.  Eyes: Normal conjunctivae. Normal eyelids.  Ears, Nose, Mouth, and Throat: Left ear no scars, no lesions, no masses. Right ear no scars, no lesions, no masses. Nose no scars, no lesions, no masses. Normal hearing. Normal lips.  Musculoskeletal: Normal gait and station of head and neck.     PAST DATA REVIEWED:  Source Of History:  Patient  Lab Test Review:   PSA  Records Review:   Pathology Reports,  Previous Patient Records  Urine Test Review:   Urinalysis  X-Ray Review: C.T. Abdomen/Pelvis: Reviewed Films.  Bone Scan: Reviewed Films.     PROCEDURES: None   ASSESSMENT:      ICD-10 Details  1 GU:   Prostate Cancer - C61    PLAN:           Document Letter(s):  Created for Patient: Clinical Summary         Notes:   1. High risk, possibly locally advanced prostate cancer: I had a detailed discussion with Cody Phelps and his wife and 2 daughters today. We discussed the very high risk nature of his prostate cancer and reviewed his staging studies which fortunately were negative for obvious metastatic disease.   The patient was counseled about the natural history of prostate cancer and the standard treatment options that are available for prostate cancer. It was explained to him how his age and life expectancy, clinical stage, Gleason score, and PSA affect his prognosis, the decision to proceed with additional staging studies, as well as how that information influences recommended treatment strategies. We discussed the roles for active surveillance, radiation therapy, surgical therapy, androgen deprivation, as well as ablative therapy options for the treatment of prostate cancer as  appropriate to his individual cancer situation. We discussed the risks and benefits of these options with regard to their impact on cancer control and also in terms of potential adverse events, complications, and impact on quality of life particularly related to urinary and sexual function. The patient was encouraged to ask questions throughout the discussion today and all questions were answered to his stated satisfaction. In addition, the patient was provided with and/or directed to appropriate resources and literature for further education about prostate cancer and treatment options.   It was recommended that he proceed with therapy of curative intent. Considering his high risk and possibly locally advanced prostate  cancer, 2 options were discussed with him. We discussed the approach of proceeding with primary radiation therapy that likely would involve 5 weeks of external beam radiation therapy and a seed implant boost to the prostate along with SpaceOAR hydrogel injection to reduce the risk of rectal toxicity. This would be in conjunction with long-term androgen deprivation of 18-24 months. We discussed the approach of primary surgical therapy although this would be with increased risk considering his advanced age and medical comorbidities including aortic stenosis, diabetes, and sleep apnea. We also discussed the increased risk of incontinence with his advanced age as well as a strong likelihood that he will need multimodality therapy regardless considering his disease parameters.   Currently, he appears to be leaning toward proceeding with radiation therapy with long-term androgen deprivation. He will meet with Dr. [name] and Dr. Tammi Klippel as well this afternoon. He will notify us of his final decision as well as Dr. Gloriann Loan.   Cc: Dr. Lafayette Dragon  Dr. [name]  Dr. Tyler Pita  Dr. Zola Button         E & M CODE: I spent at least 55 minutes face to face with the patient, more than 50% of that time was spent on counseling and/or coordinating care.

## 2017-04-25 NOTE — Progress Notes (Signed)
Reason for Referral: Prostate cancer.   HPI: 76 year old gentleman currently resides in [facility] where the diagnosis of prostate cancer. He has history of COPD, diabetes and aortic stenosis that followed by cardiology. He was found to have prostate cancer after an elevated PSA of 4.44. A biopsy of the time performed by Dr. Gloriann Loan showed a Gleason score 4+4 = 8 in 1 core, Gleason score 4+3 = 7 in 5 other cores. His staging workup did not reveal any evidence of metastatic disease including a bone scan and a CT scan completed on 04/04/2017. Clinically, he reports no complaints related to his prostate cancer. He does report some frequency but no urgency or hesitancy at this time. He does not report any bone pain or pathological fractures. He remains active and attending to her activities of daily living.  He does not report any headaches, blurry vision, syncope or seizures. He does not report any fevers or chills or sweats. He does not report any cough, wheezing or hemoptysis. He does not report any nausea, vomiting or abdominal pain. He does not report any frequency urgency or hesitancy. He does not report any skeletal complaints. Remaining review of systems unremarkable.   Past Medical History:  Diagnosis Date  . Aortic valve disorders    mild AS/AR 12/26/13 echo (Dr. [name])  . COPD (chronic obstructive pulmonary disease) ([name])    bronchitis  . Diabetes mellitus   . Diverticulosis   . GERD (gastroesophageal reflux disease)   . Heart murmur   . Hypertension   . Mixed hyperlipidemia   . OSA on CPAP   . Prostate cancer ([facility])   . Sleep apnea    uses CPAP  :  Past Surgical History:  Procedure Laterality Date  . ANKLE SURGERY Right   . APPENDECTOMY  1988  . LAMINECTOMY WITH POSTERIOR LATERAL ARTHRODESIS LEVEL 2 N/A 02/12/2014   Procedure: LUMBAR TWO-THREE,LUIMBAR THREE-FOUR LAMINECTOMY/FORAMINOTOMY;POSSIBLE POSTEROLATERAL ARTHRODESIS WITH AUTOGRAFT;  Surgeon: [name], MD;   Location: [facility];  Service: Neurosurgery;  Laterality: N/A;  . NASAL POLYP EXCISION    . NASAL SEPTUM SURGERY    . PROSTATE BIOPSY    . TEE WITHOUT CARDIOVERSION  03/20/2012   Procedure: TRANSESOPHAGEAL ECHOCARDIOGRAM (TEE);  Surgeon: Laverda Page, MD;  Location: [place] [facility];  Service: Cardiovascular;  Laterality: N/A;  :   Current Outpatient Prescriptions:  .  Amlodipine-Valsartan-HCTZ 10-160-12.5 MG TABS, Take 1 tablet by mouth daily. , Disp: , Rfl:  .  aspirin 81 MG tablet, Take 81 mg by mouth daily., Disp: , Rfl:  .  colesevelam (WELCHOL) 625 MG tablet, Take 1,875 mg by mouth 2 (two) times daily with a meal. Taking 3 tablets two times per day., Disp: , Rfl:  .  donepezil (ARICEPT) 10 MG tablet, Take 10 mg by mouth daily. , Disp: , Rfl:  .  esomeprazole (NEXIUM) 40 MG capsule, Take 40 mg by mouth daily as needed (ACID). , Disp: , Rfl:  .  ezetimibe-simvastatin (VYTORIN) 10-80 MG per tablet, Take 1 tablet by mouth daily.  , Disp: , Rfl:  .  insulin glargine (LANTUS SOLOSTAR) 100 UNIT/ML injection, Inject 40 Units into the skin at bedtime. , Disp: , Rfl:  .  mometasone (NASONEX) 50 MCG/ACT nasal spray, Place 2 sprays into the nose daily as needed (ALLERGIES)., Disp: , Rfl:  .  nabumetone (RELAFEN) 750 MG tablet, Take 750 mg by mouth daily., Disp: , Rfl:  .  naproxen sodium (ANAPROX) 220 MG tablet, Take  220 mg by mouth as needed., Disp: , Rfl:  .  omega-3 acid ethyl esters (LOVAZA) 1 G capsule, Take 2 g by mouth 2 (two) times daily.  , Disp: , Rfl:  .  Pioglitazone HCl-Metformin HCl (ACTOPLUS MET XR) 30-1000 MG TB24, Take 1 tablet by mouth daily., Disp: , Rfl:  .  pregabalin (LYRICA) 50 MG capsule, Take 2 capsules (100 mg total) by mouth 2 (two) times daily. (Patient not taking: Reported on 04/25/2017), Disp: 120 capsule, Rfl: 2 .  rOPINIRole (REQUIP) 1 MG tablet, Take 1 mg by mouth 2 (two) times daily. , Disp: , Rfl:  .  sildenafil (REVATIO) 20 MG tablet, Take 20 mg by mouth 3  (three) times daily., Disp: , Rfl:  .  tamsulosin (FLOMAX) 0.4 MG CAPS capsule, Take 0.4 mg by mouth., Disp: , Rfl: :  No Known Allergies:  Family History  Problem Relation Age of Onset  . Stroke Mother   . Hypertension Mother   . Heart attack Father   . Heart murmur Father   . Hypertension Sister   :  Social History   Social History  . Marital status: Married    Spouse name: Butch Penny  . Number of children: 2  . Years of education: 14   Occupational History  . Not on file.   Social History Main Topics  . Smoking status: Former Smoker    Years: 1.00    Types: Cigarettes    Quit date: 02/04/1967  . Smokeless tobacco: Never Used     Comment: smoked 1 q2-3 days  . Alcohol use Yes     Comment: occas.  . Drug use: No  . Sexual activity: Yes     Comment: taking sildenafil to manage ED   Other Topics Concern  . Not on file   Social History Narrative   Patient is married Butch Penny).   Patient has two children.   Patient is retired.   Patient has a college education.   Patient is right-handed.   Patient drinks two cups of coffee per day and 2-3 cups of Diet soda daily and limited tea.           :  Pertinent items are noted in HPI.  Exam: ECOG 0 General appearance: alert and cooperative Throat: lips, mucosa, and tongue normal; teeth and gums normal Neck: no adenopathy Back: negative Resp: clear to auscultation bilaterally Chest wall: no tenderness Cardio: regular rate and rhythm, S1, S2 normal, no murmur, click, rub or gallop GI: soft, non-tender; bowel sounds normal; no masses,  no organomegaly Extremities: extremities normal, atraumatic, no cyanosis or edema Pulses: 2+ and symmetric Skin: Skin color, texture, turgor normal. No rashes or lesions Lymph nodes: Cervical, supraclavicular, and axillary nodes normal.   Nm Bone Scan Whole Body  Result Date: 04/04/2017 CLINICAL DATA:  Low back pain for several years. History of prostate cancer. EXAM: NUCLEAR MEDICINE  WHOLE BODY BONE SCAN TECHNIQUE: Whole body anterior and posterior images were obtained approximately 3 hours after intravenous injection of radiopharmaceutical. RADIOPHARMACEUTICALS:  21.5 mCi Technetium-72mMDP IV COMPARISON:  CT of the abdomen pelvis from 04/04/2017 FINDINGS: There is new degenerative type changes identified within the lower thoracic and mid lumbar spine. This corresponds to CT findings. Mild degenerative changes are noted within the medial compartment of the left knee and the patellofemoral compartment of the right knee. There is also degenerative changes identified within knee right ankle and right great toe. No abnormal foci of increased radiotracer uptake identified to  suggest bone metastases. IMPRESSION: 1. No evidence of metastatic disease. 2. Degenerative changes as detailed above. Electronically Signed   By: Kerby Moors M.D.   On: 04/04/2017 14:36    Assessment and Plan:   76 year old gentleman with prostate cancer diagnosed in August 2018. He presented with a PSA of 4.4 and found to have a Gleason score 4+4 = 8 in 1 core. He has 5 other cores with Gleason score 4+3 = 7.  His case was discussed today and the prostate cancer multidisciplinary clinic. His pathology was discussed with the reviewing pathologist. Imaging studies including bone scan and CT scan were reviewed with radiology. Not these images showed metastatic disease at this time.  The natural course of this disease was reviewed today including options of therapy. Primary surgical therapy versus radiation therapy and androgen deprivation were reviewed. Given his age, history of aortic stenosis he would be reasonable to consider radiation therapy with androgen deprivation therapy as the primary option. Complications associated with androgen deprivation therapy were reviewed today. I see no other need for other systemic therapy at this time. He understands he has high risk of developing advanced disease and definitive  therapy would be recommended at this time.  All questions were answered today to his satisfaction.

## 2017-04-25 NOTE — Progress Notes (Signed)
Before [name] left the clinic, he decided that he would like to move forward with ADT, radiation with seed boost. I spoke with Ashlyn ,PA to inform her. She will move forward with getting treatment scheduled.

## 2017-04-25 NOTE — Progress Notes (Signed)
GU Location of Tumor / Histology: prostatic adenocarcinoma  If Prostate Cancer, Gleason Score is (4 + 4) and PSA is (4.43). Prostate volume: 32.32 grams  Cody Phelps was previously seen by Dr. Sharin Grave and referred to Dr. Gloriann Loan for prostate biopsy. Patient suffered from obstructive voiding complaints that improved with Flomax. Also, suffered from ED and started on Slidenafil.   Biopsies of prostate (if applicable) revealed:    Past/Anticipated interventions by urology, if any: prescribed flomax and Viagra, prostate biopsy, CT of abdomen and pelvis and bone scan (negative), referral to [facility]  Past/Anticipated interventions by medical oncology, if any: no  Weight changes, if any: no  Bowel/Bladder complaints, if any: IPSS 22 before Flomax   Nausea/Vomiting, if any: no  SAFETY ISSUES:  Prior radiation? ?  Pacemaker/ICD? ?  Possible current pregnancy? no  Is the patient on methotrexate? ?  Current Complaints / other details:  76 year old male. Married with two daughters. Retired.

## 2017-04-25 NOTE — Progress Notes (Signed)
                               Care Plan Summary  Name: Cody Phelps DOB: September 10, 1940   Your Medical Team:   Urologist -  Dr. Raynelle Bring, [facility]  Radiation Oncologist - Dr. Tyler Pita, [facility]   Medical Oncologist - Dr. Zola Button, [place]  Recommendations: 1) Radiation with seed boost 2) Androgen Deprivation  2) ? Prostatectomy   * These recommendations are based on information available as of today's consult.      Recommendations may change depending on the results of further tests or exams.  Next Steps: 1) Discuss options with family and call Cody Rue, RN or Cody Phelps- Dr. Johny Phelps office    When appointments need to be scheduled, you will be contacted by [facility] and/or [facility].  Patient provided with business cards for all providers and was given a copy of "Fall [place] Patient Safety Plan".  Questions?  Please do not hesitate to call Cody Rue, RN, BSN, OCN at [phone] any questions or concerns.  Cody Phelps is your Oncology Nurse Navigator and is available to assist you while you're receiving your medical care at [facility]

## 2017-04-27 ENCOUNTER — Encounter: Payer: Self-pay | Admitting: General Practice

## 2017-04-27 ENCOUNTER — Encounter: Payer: Self-pay | Admitting: *Deleted

## 2017-04-27 NOTE — Progress Notes (Signed)
Cody Phelps presented to [facility] on 04/25/2017, where he received full packet of [facility] team/resource information. The patient scored a 2 on the Psychosocial Distress Thermometer which indicates mild distress. Attempted to f/u by phone to assess for distress and other psychosocial needs, but Cody Phelps was unavailable.    ONCBCN DISTRESS SCREENING 04/27/2017  Screening Type Initial Screening  Distress experienced in past week (1-10) 2  Emotional problem type Adjusting to illness  Referral to support programs Yes    Follow up needed: Yes.  Plan to f/u by phone.   [place], [place], [facility] Pager [phone] Voicemail [phone]

## 2017-04-28 ENCOUNTER — Encounter: Payer: Self-pay | Admitting: General Practice

## 2017-04-28 NOTE — Progress Notes (Signed)
[facility] Spiritual Care Note  Reached Cody Phelps by phone to f/u after [facility], assessing needs and reviewing [facility] team/resources.  Per pt, he is most distressed about taking action ASAP due to the aggressive nature of his disease.  In particular, he is eager to schedule his shot and to receive instruction on exercises to mitigate incontinence.  Referring these concerns to Prostate Nurse Navigator Cody Phelps/RN now.  Cody Phelps knows to contact team with any further needs, questions, concerns, or interests.   [place], [place], [facility] Pager [phone] Voicemail [phone]

## 2017-05-03 ENCOUNTER — Telehealth: Payer: Self-pay | Admitting: *Deleted

## 2017-05-03 NOTE — Telephone Encounter (Signed)
CALLED PATIENT TO INFORM OF HORMONE INJ. ON 05-08-17 - ARRIVAL TIME- 12:45 PM @ DR. BUDZYN'S OFFICE, SPOKE WITH PATIENT'S WIFE DONNA AND SHE IS AWARE OF THIS APPT.

## 2017-05-17 ENCOUNTER — Other Ambulatory Visit: Payer: Self-pay | Admitting: Urology

## 2017-05-17 DIAGNOSIS — C61 Malignant neoplasm of prostate: Secondary | ICD-10-CM

## 2017-05-22 ENCOUNTER — Other Ambulatory Visit: Payer: Self-pay | Admitting: Urology

## 2017-05-24 ENCOUNTER — Encounter: Payer: Self-pay | Admitting: Urology

## 2017-05-24 NOTE — Progress Notes (Signed)
Patient elected to proceed with brachytherapy up front followed by 5 weeks of EBRT.  He started ADT 05/08/17 and is scheduled for gold fiducial markers, SpaceOAR and brachytherapy on 07/10/2017 with Dr. Pilar Jarvis and Tammi Klippel.  He has a pre-seed CT SIM on 06/01/17. He has a follow up CT SIM for EBRT planning on 08/04/17 and will have an MRI prostate for verification of SpaceOAR placement at that time.

## 2017-06-01 ENCOUNTER — Ambulatory Visit: Payer: Medicare Other | Admitting: Radiation Oncology

## 2017-06-01 ENCOUNTER — Ambulatory Visit: Payer: Self-pay | Admitting: Radiation Oncology

## 2017-06-07 ENCOUNTER — Telehealth: Payer: Self-pay | Admitting: *Deleted

## 2017-06-07 NOTE — Telephone Encounter (Signed)
Called patient to remind of pre-seed appts. for  06-08-17, spoke with patient's wife- Butch Penny and she is aware of these appts.

## 2017-06-08 ENCOUNTER — Ambulatory Visit
Admission: RE | Admit: 2017-06-08 | Discharge: 2017-06-08 | Disposition: A | Discharge status: Home / Self Care | Payer: Medicare Other | Source: Ambulatory Visit | Attending: Radiation Oncology | Admitting: Radiation Oncology

## 2017-06-08 ENCOUNTER — Encounter: Payer: Self-pay | Admitting: Medical Oncology

## 2017-06-08 ENCOUNTER — Ambulatory Visit (HOSPITAL_COMMUNITY)
Admission: RE | Admit: 2017-06-08 | Discharge: 2017-06-08 | Disposition: A | Discharge status: Home / Self Care | Payer: Medicare Other | Source: Ambulatory Visit | Attending: Urology | Admitting: Urology

## 2017-06-08 ENCOUNTER — Encounter (HOSPITAL_COMMUNITY)
Admission: RE | Admit: 2017-06-08 | Discharge: 2017-06-08 | Disposition: A | Discharge status: Home / Self Care | Payer: Medicare Other | Source: Ambulatory Visit | Attending: Urology | Admitting: Urology

## 2017-06-08 ENCOUNTER — Encounter (INDEPENDENT_AMBULATORY_CARE_PROVIDER_SITE_OTHER): Payer: Self-pay

## 2017-06-08 DIAGNOSIS — C61 Malignant neoplasm of prostate: Secondary | ICD-10-CM | POA: Diagnosis not present

## 2017-06-08 DIAGNOSIS — Z01818 Encounter for other preprocedural examination: Secondary | ICD-10-CM | POA: Diagnosis not present

## 2017-06-08 NOTE — Progress Notes (Signed)
  Radiation Oncology         [phone] ________________________________  Name: Cody Phelps MRN: [id number]  Date: 06/08/2017  DOB: Sep 08, 1940  SIMULATION AND TREATMENT PLANNING NOTE PUBIC ARCH STUDY  [id number], [name], MD  Raynelle Bring, MD  DIAGNOSIS: 76 y.o.  gentleman with stage T2a adenocarcinoma of the prostate with a Gleason's score of 4+4 and a PSA of 4.43  No diagnosis found.  COMPLEX SIMULATION:  The patient presented today for evaluation for possible prostate seed implant. He was brought to the radiation planning suite and placed supine on the CT couch. A 3-dimensional image study set was obtained in upload to the planning computer. There, on each axial slice, I contoured the prostate gland. Then, using three-dimensional radiation planning tools I reconstructed the prostate in view of the structures from the transperineal needle pathway to assess for possible pubic arch interference. In doing so, I did not appreciate any pubic arch interference. Also, the patient's prostate volume was estimated based on the drawn structure. The volume was 25 cc.  Given the pubic arch appearance and prostate volume, patient remains a good candidate to proceed with prostate seed implant. Today, he freely provided informed written consent to proceed.    PLAN: The patient will undergo prostate seed implant.   ________________________________  Sheral Apley. Tammi Klippel, M.D.  This document serves as a record of services personally performed by Tyler Pita, MD. It was created on his behalf by Valeta Harms, a trained medical scribe. The creation of this record is based on the scribe's personal observations and the provider's statements to them. This document has been checked and approved by the attending provider.

## 2017-06-30 ENCOUNTER — Telehealth: Payer: Self-pay | Admitting: *Deleted

## 2017-06-30 NOTE — Telephone Encounter (Signed)
CALLED PATIENT TO INFORM OF LAB APPT. ON 07-03-17 @ 11 AM, SPOKE WITH PATIENT AND HE IS AWARE OF THIS APPT.

## 2017-07-03 ENCOUNTER — Encounter (HOSPITAL_COMMUNITY)
Admission: RE | Admit: 2017-07-03 | Discharge: 2017-07-03 | Disposition: A | Discharge status: Home / Self Care | Payer: Medicare Other | Source: Ambulatory Visit | Attending: Urology | Admitting: Urology

## 2017-07-03 DIAGNOSIS — Z01818 Encounter for other preprocedural examination: Secondary | ICD-10-CM | POA: Diagnosis not present

## 2017-07-03 LAB — CBC
HCT: 36.3 % — ABNORMAL LOW (ref 39.0–52.0)
Hemoglobin: 12.3 g/dL — ABNORMAL LOW (ref 13.0–17.0)
MCH: 29.7 pg (ref 26.0–34.0)
MCHC: 33.9 g/dL (ref 30.0–36.0)
MCV: 87.7 fL (ref 78.0–100.0)
Platelets: 241 10*3/uL (ref 150–400)
RBC: 4.14 MIL/uL — ABNORMAL LOW (ref 4.22–5.81)
RDW: 12.8 % (ref 11.5–15.5)
WBC: 5 10*3/uL (ref 4.0–10.5)

## 2017-07-03 LAB — COMPREHENSIVE METABOLIC PANEL
ALT: 17 U/L (ref 17–63)
AST: 21 U/L (ref 15–41)
Albumin: 4 g/dL (ref 3.5–5.0)
Alkaline Phosphatase: 38 U/L (ref 38–126)
Anion gap: 8 (ref 5–15)
BUN: 14 mg/dL (ref 6–20)
CO2: 26 mmol/L (ref 22–32)
Calcium: 9 mg/dL (ref 8.9–10.3)
Chloride: 105 mmol/L (ref 101–111)
Creatinine, Ser: 0.81 mg/dL (ref 0.61–1.24)
GFR calc Af Amer: >60 mL/min (ref >60)
GFR calc non Af Amer: >60 mL/min (ref >60)
Glucose, Bld: 111 mg/dL — ABNORMAL HIGH (ref 65–99)
Potassium: 3.9 mmol/L (ref 3.5–5.1)
Sodium: 139 mmol/L (ref 135–145)
Total Bilirubin: 0.4 mg/dL (ref 0.3–1.2)
Total Protein: 7.1 g/dL (ref 6.5–8.1)

## 2017-07-03 LAB — PROTIME-INR
INR: 1.02
Prothrombin Time: 13.3 seconds (ref 11.4–15.2)

## 2017-07-03 LAB — APTT: aPTT: 28 seconds (ref 24–36)

## 2017-07-04 ENCOUNTER — Encounter (HOSPITAL_BASED_OUTPATIENT_CLINIC_OR_DEPARTMENT_OTHER): Payer: Self-pay | Admitting: *Deleted

## 2017-07-04 ENCOUNTER — Other Ambulatory Visit: Payer: Self-pay

## 2017-07-04 DIAGNOSIS — Z01818 Encounter for other preprocedural examination: Secondary | ICD-10-CM | POA: Diagnosis not present

## 2017-07-04 NOTE — Progress Notes (Addendum)
SPOKE W/ PT VIA PHONE FOR PRE-OP INTERVIEW.  NPO AFTER MN W/ EXCEPTION CLEAR LIQUIDS UNTIL 0700 (NO CREAM / MILK PRODUCTS).  ARRIVE AT 1100.  CURRENT LAB RESULTS, CXR, AND EKG IN CHART AND Epic.  WILL TAKE NEXIUM, ARICEPT, REQUIP, VYTORIN, AND TYLENOL AM DOS W/ SIPS OF WATER.  PT STATED AM BLOOD SUGAR'S RUN 140'S,  VERBALIZED UNDERSTANDING TO DO HALF DOSE LANTUS NIGHT BEFORE SURGERY , 20 UNITS.  WILL DO FLEET ENEMA AM DOS.  WILL REQUEST LOV NOTE , ECHO, AND CAROTID DUPLUX AND STRESS TEST FROM [place].  ADDENDUM:  CALLED AND SPOKE W/ DR [place] MDA VIA PHONE TO REVIEW PT CHART.  REVIEW PT MEDICAL HISTORY INCLUDING AORTIC STENOSIS W/ AVA 1.16CM^2 (PER LAST ECHO 55/2080, DR [name]),  LICA 22-33% STENOSIS, OSA W/ CPAP, AND PT DENIES CARDIAC S&S.  DR GERMEROTH STATED OK TO PROCEED. TOLD HIM DR [name] LOV NOTE AND OTHER TESTING WILL BE W/ CHART.

## 2017-07-07 ENCOUNTER — Telehealth: Payer: Self-pay | Admitting: *Deleted

## 2017-07-07 NOTE — Telephone Encounter (Signed)
Called patient to remind of procedure for 07-10-17, spoke with patient and he is aware of this procedure.

## 2017-07-10 ENCOUNTER — Inpatient Hospital Stay (HOSPITAL_COMMUNITY): Admission: RE | Admit: 2017-07-10 | Payer: Medicare Other | Source: Ambulatory Visit

## 2017-07-12 ENCOUNTER — Telehealth: Payer: Self-pay | Admitting: *Deleted

## 2017-07-12 ENCOUNTER — Other Ambulatory Visit: Payer: Self-pay | Admitting: Urology

## 2017-07-12 NOTE — Telephone Encounter (Signed)
CALLED PATIENT TO INFORM OF IMPLANT BEING MOVED TO 07-19-17 @ 2 PM, SPOKE WITH PATIENT'S WIFE DONNA AND SHE IS AWARE OF THIS PROCEDURE

## 2017-07-14 ENCOUNTER — Encounter (HOSPITAL_BASED_OUTPATIENT_CLINIC_OR_DEPARTMENT_OTHER): Payer: Self-pay | Admitting: *Deleted

## 2017-07-14 NOTE — Progress Notes (Signed)
Spoke w pt and wife.  Instructions unchanged. Pt NPO P mn 12/18 x meds per denise.  Fleets enema 2 hrs pta. 1/2 hs lantus  , same as before. To [facility] 12 19 @ 1200.

## 2017-07-18 ENCOUNTER — Telehealth: Payer: Self-pay | Admitting: *Deleted

## 2017-07-18 NOTE — Telephone Encounter (Signed)
CALLED PATIENT TO REMIND OF PROCEDURE FOR 07-19-17, SPOKE WITH PATIENT AND HE IS AWARE OF THIS PROCEDURE 

## 2017-07-19 ENCOUNTER — Ambulatory Visit (HOSPITAL_BASED_OUTPATIENT_CLINIC_OR_DEPARTMENT_OTHER): Payer: Medicare Other | Admitting: Anesthesiology

## 2017-07-19 ENCOUNTER — Encounter (HOSPITAL_BASED_OUTPATIENT_CLINIC_OR_DEPARTMENT_OTHER)
Admission: RE | Disposition: A | Discharge status: Home / Self Care | Payer: Self-pay | Source: Ambulatory Visit | Attending: Urology

## 2017-07-19 ENCOUNTER — Ambulatory Visit (HOSPITAL_BASED_OUTPATIENT_CLINIC_OR_DEPARTMENT_OTHER)
Admission: RE | Admit: 2017-07-19 | Discharge: 2017-07-19 | Disposition: A | Discharge status: Home / Self Care | Payer: Medicare Other | Source: Ambulatory Visit | Attending: Urology | Admitting: Urology

## 2017-07-19 ENCOUNTER — Ambulatory Visit (HOSPITAL_COMMUNITY): Payer: Medicare Other

## 2017-07-19 ENCOUNTER — Encounter (HOSPITAL_BASED_OUTPATIENT_CLINIC_OR_DEPARTMENT_OTHER): Payer: Self-pay | Admitting: *Deleted

## 2017-07-19 DIAGNOSIS — E119 Type 2 diabetes mellitus without complications: Secondary | ICD-10-CM | POA: Diagnosis not present

## 2017-07-19 DIAGNOSIS — G4730 Sleep apnea, unspecified: Secondary | ICD-10-CM | POA: Insufficient documentation

## 2017-07-19 DIAGNOSIS — K219 Gastro-esophageal reflux disease without esophagitis: Secondary | ICD-10-CM | POA: Insufficient documentation

## 2017-07-19 DIAGNOSIS — I10 Essential (primary) hypertension: Secondary | ICD-10-CM | POA: Diagnosis not present

## 2017-07-19 DIAGNOSIS — E7800 Pure hypercholesterolemia, unspecified: Secondary | ICD-10-CM | POA: Diagnosis not present

## 2017-07-19 DIAGNOSIS — Z01818 Encounter for other preprocedural examination: Secondary | ICD-10-CM

## 2017-07-19 DIAGNOSIS — Z794 Long term (current) use of insulin: Secondary | ICD-10-CM | POA: Diagnosis not present

## 2017-07-19 DIAGNOSIS — C61 Malignant neoplasm of prostate: Secondary | ICD-10-CM | POA: Diagnosis present

## 2017-07-19 DIAGNOSIS — Z79899 Other long term (current) drug therapy: Secondary | ICD-10-CM | POA: Insufficient documentation

## 2017-07-19 DIAGNOSIS — Z8546 Personal history of malignant neoplasm of prostate: Secondary | ICD-10-CM

## 2017-07-19 HISTORY — DX: Iron deficiency anemia, unspecified: D50.9

## 2017-07-19 HISTORY — DX: Restless legs syndrome: G25.81

## 2017-07-19 HISTORY — DX: Unspecified osteoarthritis, unspecified site: M19.90

## 2017-07-19 HISTORY — DX: Scoliosis, unspecified: M41.9

## 2017-07-19 HISTORY — DX: Atrioventricular block, first degree: I44.0

## 2017-07-19 HISTORY — DX: Occlusion and stenosis of left carotid artery: I65.22

## 2017-07-19 HISTORY — PX: SPACE OAR INSTILLATION: SHX6769

## 2017-07-19 HISTORY — DX: Male erectile dysfunction, unspecified: N52.9

## 2017-07-19 HISTORY — DX: Trigger finger, unspecified finger: M65.30

## 2017-07-19 HISTORY — DX: Presence of dental prosthetic device (complete) (partial): Z97.2

## 2017-07-19 HISTORY — DX: Type 2 diabetes mellitus without complications: E11.9

## 2017-07-19 HISTORY — PX: RADIOACTIVE SEED IMPLANT: SHX5150

## 2017-07-19 HISTORY — PX: CYSTOSCOPY: SHX5120

## 2017-07-19 LAB — GLUCOSE, CAPILLARY
Glucose-Capillary: 104 mg/dL — ABNORMAL HIGH (ref 65–99)
Glucose-Capillary: 125 mg/dL — ABNORMAL HIGH (ref 65–99)

## 2017-07-19 SURGERY — INSERTION, RADIATION SOURCE, PROSTATE
Anesthesia: General | Site: Perineum

## 2017-07-19 MED ORDER — DEXAMETHASONE SODIUM PHOSPHATE 10 MG/ML IJ SOLN
INTRAMUSCULAR | Status: AC
  Filled 2017-07-19: qty 1

## 2017-07-19 MED ORDER — LIDOCAINE 2% (20 MG/ML) 5 ML SYRINGE
INTRAMUSCULAR | Status: AC
  Filled 2017-07-19: qty 5

## 2017-07-19 MED ORDER — PROMETHAZINE HCL 25 MG/ML IJ SOLN
6.2500 mg | INTRAMUSCULAR | Status: DC | PRN
  Filled 2017-07-19: qty 1

## 2017-07-19 MED ORDER — DEXAMETHASONE SODIUM PHOSPHATE 10 MG/ML IJ SOLN
INTRAMUSCULAR | Status: DC | PRN
  Administered 2017-07-19: 10 mg via INTRAVENOUS

## 2017-07-19 MED ORDER — FENTANYL CITRATE (PF) 100 MCG/2ML IJ SOLN
INTRAMUSCULAR | Status: AC
  Filled 2017-07-19: qty 2

## 2017-07-19 MED ORDER — CIPROFLOXACIN IN D5W 400 MG/200ML IV SOLN
400.0000 mg | INTRAVENOUS | Status: DC
  Filled 2017-07-19: qty 200

## 2017-07-19 MED ORDER — PROPOFOL 10 MG/ML IV BOLUS
INTRAVENOUS | Status: DC | PRN
  Administered 2017-07-19: 40 mg via INTRAVENOUS
  Administered 2017-07-19: 20 mg via INTRAVENOUS
  Administered 2017-07-19 (×2): 30 mg via INTRAVENOUS
  Administered 2017-07-19: 150 mg via INTRAVENOUS

## 2017-07-19 MED ORDER — CIPROFLOXACIN IN D5W 400 MG/200ML IV SOLN
400.0000 mg | INTRAVENOUS | Status: AC
  Administered 2017-07-19: 400 mg via INTRAVENOUS
  Filled 2017-07-19: qty 200

## 2017-07-19 MED ORDER — HYDROMORPHONE HCL 1 MG/ML IJ SOLN
INTRAMUSCULAR | Status: AC
  Filled 2017-07-19: qty 1

## 2017-07-19 MED ORDER — HYDROCODONE-ACETAMINOPHEN 5-325 MG PO TABS: 1.0000 | ORAL_TABLET | ORAL | 0 refills | Status: DC | PRN

## 2017-07-19 MED ORDER — FLEET ENEMA 7-19 GM/118ML RE ENEM
1.0000 | ENEMA | Freq: Once | RECTAL | Status: DC
  Filled 2017-07-19: qty 1

## 2017-07-19 MED ORDER — KETOROLAC TROMETHAMINE 30 MG/ML IJ SOLN
INTRAMUSCULAR | Status: DC | PRN
  Administered 2017-07-19: 30 mg via INTRAVENOUS

## 2017-07-19 MED ORDER — CIPROFLOXACIN IN D5W 400 MG/200ML IV SOLN
INTRAVENOUS | Status: AC
  Filled 2017-07-19: qty 200

## 2017-07-19 MED ORDER — CIPROFLOXACIN HCL 500 MG PO TABS: 500.0000 mg | ORAL_TABLET | Freq: Two times a day (BID) | ORAL | 0 refills | Status: AC

## 2017-07-19 MED ORDER — LIDOCAINE 2% (20 MG/ML) 5 ML SYRINGE
INTRAMUSCULAR | Status: DC | PRN
  Administered 2017-07-19: 100 mg via INTRAVENOUS

## 2017-07-19 MED ORDER — ONDANSETRON HCL 4 MG/2ML IJ SOLN
INTRAMUSCULAR | Status: AC
  Filled 2017-07-19: qty 2

## 2017-07-19 MED ORDER — MIDAZOLAM HCL 2 MG/2ML IJ SOLN
INTRAMUSCULAR | Status: AC
  Filled 2017-07-19: qty 2

## 2017-07-19 MED ORDER — HYDROMORPHONE HCL 1 MG/ML IJ SOLN
0.2500 mg | INTRAMUSCULAR | Status: DC | PRN
  Administered 2017-07-19 (×3): 0.5 mg via INTRAVENOUS
  Filled 2017-07-19: qty 0.5

## 2017-07-19 MED ORDER — ONDANSETRON HCL 4 MG/2ML IJ SOLN
INTRAMUSCULAR | Status: DC | PRN
  Administered 2017-07-19: 4 mg via INTRAVENOUS

## 2017-07-19 MED ORDER — FENTANYL CITRATE (PF) 100 MCG/2ML IJ SOLN
INTRAMUSCULAR | Status: DC | PRN
  Administered 2017-07-19: 100 ug via INTRAVENOUS

## 2017-07-19 MED ORDER — KETOROLAC TROMETHAMINE 30 MG/ML IJ SOLN
INTRAMUSCULAR | Status: AC
  Filled 2017-07-19: qty 1

## 2017-07-19 MED ORDER — LACTATED RINGERS IV SOLN
INTRAVENOUS | Status: DC
  Administered 2017-07-19 (×2): via INTRAVENOUS
  Filled 2017-07-19: qty 1000

## 2017-07-19 MED ORDER — PROPOFOL 10 MG/ML IV BOLUS
INTRAVENOUS | Status: AC
  Filled 2017-07-19: qty 20

## 2017-07-19 SURGICAL SUPPLY — 31 items
BAG URINE DRAINAGE (UROLOGICAL SUPPLIES) ×5 IMPLANT
BLADE CLIPPER SURG (BLADE) ×5 IMPLANT
CATH FOLEY 2WAY SLVR  5CC 16FR (CATHETERS) ×4
CATH FOLEY 2WAY SLVR 5CC 16FR (CATHETERS) ×6 IMPLANT
CATH ROBINSON RED A/P 16FR (CATHETERS) IMPLANT
CATH ROBINSON RED A/P 20FR (CATHETERS) ×5 IMPLANT
CLOTH BEACON ORANGE TIMEOUT ST (SAFETY) ×5 IMPLANT
COVER BACK TABLE 60X90IN (DRAPES) ×5 IMPLANT
COVER MAYO STAND STRL (DRAPES) ×5 IMPLANT
COVER TABLE BACK 60X90 (DRAPES) ×5 IMPLANT
DRSG TEGADERM 4X4.75 (GAUZE/BANDAGES/DRESSINGS) ×7 IMPLANT
DRSG TEGADERM 8X12 (GAUZE/BANDAGES/DRESSINGS) ×10 IMPLANT
GAUZE SPONGE 4X4 12PLY STRL LF (GAUZE/BANDAGES/DRESSINGS) ×4 IMPLANT
GLOVE BIO SURGEON STRL SZ7.5 (GLOVE) ×10 IMPLANT
GLOVE ECLIPSE 8.0 STRL XLNG CF (GLOVE) ×5 IMPLANT
GOWN STRL REUS W/ TWL XL LVL3 (GOWN DISPOSABLE) ×3 IMPLANT
GOWN STRL REUS W/TWL XL LVL3 (GOWN DISPOSABLE) ×10 IMPLANT
HOLDER FOLEY CATH W/STRAP (MISCELLANEOUS) ×5 IMPLANT
IMPL SPACEOAR SYSTEM 10ML (MISCELLANEOUS) ×3 IMPLANT
IMPLANT SPACEOAR SYSTEM 10ML (MISCELLANEOUS) ×5
IV NS 1000ML (IV SOLUTION) ×5
IV NS 1000ML BAXH (IV SOLUTION) ×3 IMPLANT
KIT RM TURNOVER CYSTO AR (KITS) ×5 IMPLANT
PACK CYSTO (CUSTOM PROCEDURE TRAY) ×5 IMPLANT
SURGILUBE 2OZ TUBE FLIPTOP (MISCELLANEOUS) ×5 IMPLANT
SUT BONE WAX W31G (SUTURE) ×5 IMPLANT
SYRINGE 10CC LL (SYRINGE) IMPLANT
UNDERPAD 30X30 (UNDERPADS AND DIAPERS) ×10 IMPLANT
UNDERPAD 30X30 INCONTINENT (UNDERPADS AND DIAPERS) ×10 IMPLANT
WATER STERILE IRR 500ML POUR (IV SOLUTION) ×5 IMPLANT
selectSeed 1-125 ×3 IMPLANT

## 2017-07-19 NOTE — Anesthesia Procedure Notes (Signed)
Procedure Name: LMA Insertion Date/Time: 07/19/2017 2:10 PM Performed by: Wanita Chamberlain, CRNA Pre-anesthesia Checklist: Patient identified, Emergency Drugs available, Suction available, Patient being monitored and Timeout performed Patient Re-evaluated:Patient Re-evaluated prior to induction Oxygen Delivery Method: Circle system utilized Preoxygenation: Pre-oxygenation with 100% oxygen Induction Type: IV induction Ventilation: Mask ventilation without difficulty LMA: LMA inserted LMA Size: 4.0 Number of attempts: 1 Placement Confirmation: breath sounds checked- equal and bilateral and positive ETCO2 Tube secured with: Tape Dental Injury: Teeth and Oropharynx as per pre-operative assessment

## 2017-07-19 NOTE — Discharge Instructions (Signed)
Radioactive Seed Implant Home Care Instructions   Activity:    Rest for the remainder of the day.  Do not drive or operate equipment today.  You may resume normal  activities in a few days as instructed by your physician, without risk of harmful radiation exposure to those around you, provided you follow the time and distance precautions on the Radiation Oncology Instruction Sheet.   Meals: Drink plenty of lipuids and eat light foods, such as gelatin or soup this evening .  You may return to normal meal plan tomorrow.  Return To Work: You may return to work as instructed by your physician.  Special Instruction:   If any seeds are found, use tweezers to pick up seeds and place in a glass container of any kind and bring to your physician's office.  Call your physician if any of these symptoms occur:   Persistent or heavy bleeding  Urine stream diminishes or stops completely after catheter is removed  Fever equal to or greater than 101 degrees F  Cloudy urine with a strong foul odor  Severe pain  You may feel some burning pain and/or hesitancy when you urinate after the catheter is removed.  These symptoms may increase over the next few weeks, but should diminish within forur to six weeks.  Applying moist heat to the lower abdomen or a hot tub bath may help relieve the pain.  If the discomfort becomes severe, please call your physician for additional medications.     Post Anesthesia Home Care Instructions  Activity: Get plenty of rest for the remainder of the day. A responsible individual must stay with you for 24 hours following the procedure.  For the next 24 hours, DO NOT: -Drive a car -Operate machinery -Drink alcoholic beverages -Take any medication unless instructed by your physician -Make any legal decisions or sign important papers.  Meals: Start with liquid foods such as gelatin or soup. Progress to regular foods as tolerated. Avoid greasy, spicy, heavy foods. If  nausea and/or vomiting occur, drink only clear liquids until the nausea and/or vomiting subsides. Call your physician if vomiting continues.  Special Instructions/Symptoms: Your throat may feel dry or sore from the anesthesia or the breathing tube placed in your throat during surgery. If this causes discomfort, gargle with warm salt water. The discomfort should disappear within 24 hours.  If you had a scopolamine patch placed behind your ear for the management of post- operative nausea and/or vomiting:  1. The medication in the patch is effective for 72 hours, after which it should be removed.  Wrap patch in a tissue and discard in the trash. Wash hands thoroughly with soap and water. 2. You may remove the patch earlier than 72 hours if you experience unpleasant side effects which may include dry mouth, dizziness or visual disturbances. 3. Avoid touching the patch. Wash your hands with soap and water after contact with the patch.     

## 2017-07-19 NOTE — Anesthesia Postprocedure Evaluation (Signed)
Anesthesia Post Note  Patient: Cody Phelps  Procedure(s) Performed: RADIOACTIVE SEED IMPLANT/BRACHYTHERAPY IMPLANT (N/A Perineum) SPACE OAR INSTILLATION (N/A Perineum) CYSTOSCOPY     Patient location during evaluation: PACU Anesthesia Type: General Level of consciousness: awake and alert Pain management: pain level controlled Vital Signs Assessment: post-procedure vital signs reviewed and stable Respiratory status: spontaneous breathing, nonlabored ventilation, respiratory function stable and patient connected to nasal cannula oxygen Cardiovascular status: blood pressure returned to baseline and stable Postop Assessment: no apparent nausea or vomiting Anesthetic complications: no    Last Vitals:  Vitals:   07/19/17 1219 07/19/17 1551  BP: (!) 173/70 (!) 178/100  Pulse: 74 80  Resp: 18 18  Temp: 37 C (!) 36.4 C  SpO2: 100% 98%    Last Pain:  Vitals:   07/19/17 1219  TempSrc: Oral                 Cody Phelps

## 2017-07-19 NOTE — Transfer of Care (Signed)
Immediate Anesthesia Transfer of Care Note  Patient: Cody Phelps  Procedure(s) Performed: RADIOACTIVE SEED IMPLANT/BRACHYTHERAPY IMPLANT (N/A Perineum) SPACE OAR INSTILLATION (N/A Perineum) CYSTOSCOPY  Patient Location: PACU  Anesthesia Type:General  Level of Consciousness: awake, alert , oriented and patient cooperative  Airway & Oxygen Therapy: Patient Spontanous Breathing and Patient connected to nasal cannula oxygen  Post-op Assessment: Report given to RN and Post -op Vital signs reviewed and stable  Post vital signs: Reviewed and stable  Last Vitals:  Vitals:   07/19/17 1219  BP: (!) 173/70  Pulse: 74  Resp: 18  Temp: 37 C  SpO2: 100%    Last Pain:  Vitals:   07/19/17 1219  TempSrc: Oral         Complications: No apparent anesthesia complications

## 2017-07-19 NOTE — Progress Notes (Signed)
Radiation Oncology         [phone] ________________________________  Name: Cody Phelps MRN: [id number]  Date: 07/19/2017  DOB: 1940/10/06       Prostate Seed Implant  [id number], Cody Main, MD  No ref. provider found  DIAGNOSIS: 76 y.o. gentleman with stage T2aadenocarcinoma of the prostate with a Gleason's score of 4+4and a PSA of 4.43      ICD-10-CM   1. Pre-op testing Z01.818 DG Chest 2 View    DG Chest 2 View  2. History of prostate cancer Z85.46 US Guided Needle Placement    [place] Intraoperative    [place] PROSTATE GOLD FEED MARKERS    [place] Transrectal Complete    US Guided Needle Placement    [place] Intraoperative    [place] PROSTATE GOLD FEED MARKERS    [place] Transrectal Complete  3. History of prostate cancer Z85.46 US Guided Needle Placement    [place] Intraoperative    [place] PROSTATE GOLD FEED MARKERS    [place] Transrectal Complete    US Guided Needle Placement    [place] Intraoperative    [place] PROSTATE GOLD FEED MARKERS    [place] Transrectal Complete  4. History of prostate cancer Z85.46 US Guided Needle Placement    [place] Intraoperative    [place] PROSTATE GOLD FEED MARKERS    [place] Transrectal Complete    US Guided Needle Placement    [place] Intraoperative    [place] PROSTATE GOLD FEED MARKERS    [place] Transrectal Complete  5. History of prostate cancer Z85.46 US Guided Needle Placement    [place] Intraoperative    [place] PROSTATE GOLD FEED MARKERS    [place] Transrectal Complete    US Guided Needle Placement    [place] Intraoperative    [place] PROSTATE GOLD FEED MARKERS    [place] Transrectal Complete    PROCEDURE: Insertion of radioactive I-125 seeds into the prostate gland.  RADIATION DOSE: 110 Gy, boost therapy.  TECHNIQUE: Cody Phelps was brought to the operating room with the urologist. He was placed in the dorsolithotomy position. He was catheterized and a rectal tube was inserted. The perineum was shaved, prepped and draped. The ultrasound probe was then introduced into the rectum to see the prostate gland.  TREATMENT DEVICE:  A needle grid was attached to the ultrasound probe stand and anchor needles were placed.  3D PLANNING: The prostate was imaged in 3D using a sagittal sweep of the prostate probe. These images were transferred to the planning computer. There, the prostate, urethra and rectum were defined on each axial reconstructed image. Then, the software created an optimized 3D plan and a few seed positions were adjusted. The quality of the plan was reviewed using [facility] information for the target and the following two organs at risk:  Urethra and Rectum.  Then the accepted plan was uploaded to the seed Selectron afterloading unit.  PROSTATE VOLUME STUDY:  Using transrectal ultrasound the volume of the prostate was verified to be 26.9 cc.  SPECIAL TREATMENT PROCEDURE/SUPERVISION AND HANDLING: The Nucletron FIRST system was used to place the needles under sagittal guidance. A total of 22 needles were used to deposit 69 seeds in the prostate gland. The individual seed activity was 0.287 mCi.  SpaceOAR:  Yes  COMPLEX SIMULATION: At the end of the procedure, an anterior radiograph of the pelvis was obtained to document seed positioning and count. Cystoscopy was performed to check the urethra and bladder.  MICRODOSIMETRY: At the end of the procedure, the patient was emitting  0.06 mR/hr at 1 meter. Accordingly, he was considered safe for hospital discharge.  PLAN: The patient will return to the radiation oncology clinic for post implant CT dosimetry in three weeks.   ________________________________  Sheral Apley Tammi Klippel, M.D.

## 2017-07-19 NOTE — Anesthesia Preprocedure Evaluation (Addendum)
Anesthesia Evaluation  Patient identified by MRN, date of birth, ID band Patient awake    Reviewed: Allergy & Precautions, NPO status , Patient's Chart, lab work & pertinent test results  Airway Mallampati: II  TM Distance: >3 FB Neck ROM: Limited    Dental  (+) Teeth Intact, Dental Advisory Given,    Pulmonary sleep apnea and Continuous Positive Airway Pressure Ventilation , former smoker,    Pulmonary exam normal        Cardiovascular Exercise Tolerance: Poor hypertension, Pt. 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  Neuromuscular disease    GI/Hepatic GERD  ,  Endo/Other  diabetes, Well Controlled, Type 2  Renal/GU      Musculoskeletal  (+) Arthritis , Osteoarthritis,    Abdominal   Peds  Hematology  (+) anemia ,   Anesthesia Other Findings   Reproductive/Obstetrics                           Anesthesia Physical Anesthesia Plan  ASA: III  Anesthesia Plan: General   Post-op Pain Management:    Induction: Intravenous  PONV Risk Score and Plan: 3 and Ondansetron, Dexamethasone and Diphenhydramine  Airway Management Planned: LMA  Additional Equipment:   Intra-op Plan:   Post-operative Plan: Extubation in OR  Informed Consent: I have reviewed the patients History and Physical, chart, labs and discussed the procedure including the risks, benefits and alternatives for the proposed anesthesia with the patient or authorized representative who has indicated his/her understanding and acceptance.   Dental advisory given  Plan Discussed with: Anesthesiologist and CRNA  Anesthesia Plan Comments:         Anesthesia Quick Evaluation

## 2017-07-19 NOTE — H&P (Addendum)
CC/HPI: Prostate cancer   Hx:   CC: Prostate Cancer   Cody Phelps is a 76 year old gentleman who was noted to have an elevated PSA of 4.43 and right sided prostate induration. This prompted a TRUS biopsy of the prostate on 03/17/17 that indicated Gleason 4+4=8 adenocarcinoma with 6 out of 12 biopsy cores positive for malignancy (all on the right side).   Family history: None.   Imaging studies:  Bone scan (04/04/17) - Negative.  CT (04/04/17) - Negative.   PMH: He has a history of diabetes, hypertension, hyperlipidemia, GERD, and sleep apnea. He also has a history of aortic stenosis diagnosed approximately 5 years ago. This is being followed by Dr. [name]. Trying to rush finish up.  PSH: Appendectomy.   TNM stage: cT2b N0 M0  PSA: 4.43  Gleason score: 4+4=8  Biopsy (03/17/17): 6/12 cores positive  Left: Benign  Right: R apex (60%, 4+3=7), R lateral apex (50%, 4+3=7), R mid (80%, 4+3=7), R lateral mid (95%, 4+3=7), R base (50%, 4+4=8), R lateral base (90%, 4+3=7)  Prostate volume: 32.3 cc   Nomogram  OC disease: 20%  EPE: 78%  SVI: 27%  LNI: 25%  PFS (5 year, 10 year): 38%,25%   Urinary function: IPSS is 12.  Erectile function: SHIM score is 5.   Patient has elected to proceed with brachytherapy as well as a 5 week course of external radiation. He also is to be started on Lupron in preparation for his radiation. He willl need to be on this for at least 2 years.     ALLERGIES: None   MEDICATIONS: Flomax 0.4 mg capsule, ext release 24 hr 1 capsule PO Q HS As Directed  Nexium 1 PO Daily As Directed  Actoplus Met  Amlodipine-Valsartan-Hctz 10 mg-160 mg-12.5 mg tablet  Donepezil Hcl 10 mg tablet  Fluticasone Propionate 1 Spray Daily As Directed  Lantus  Nabumetone 750 mg tablet 1 tablet PO Daily PRN  Omega-3 Acid Ethyl Esters 1 PO Daily  Ropinirole Hcl 1 mg tablet  Sildenafil 20 mg tablet Take 1 to 5 tabs PO daily prn  Vytorin 10 mg-80 mg tablet 1 tablet PO Daily  Welchol 625 mg  tablet     Notes: iron  vit c   GU PSH: Locm 300-399Mg/Ml Iodine,1Ml - 04/04/2017 Prostate Needle Biopsy - 03/17/2017      PSH Notes: Ankle Surgery  sinus   NON-GU PSH: Appendectomy Back surgery - about 2015 Carpal tunnel surgery Surgical Pathology, Gross And Microscopic Examination For Prostate Needle - 03/17/2017    GU PMH: 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   Tobacco Use Assessment Completed: Used Tobacco in last 30 days? Does not use smokeless tobacco. Drinks 1 drink per month. Types of alcohol consumed: Beer. Light Drinker.  Drinks 3 caffeinated drinks per day. Has not had a blood transfusion. Patient's occupation is/was Retired.    REVIEW OF SYSTEMS:    GU Review Male:   Patient reports frequent urination, hard to postpone urination, get up at night to urinate,  stream starts and stops, and trouble starting your stream. Patient denies burning/ pain with urination, leakage of urine, have to strain to urinate , erection problems, and penile pain.  Gastrointestinal (Upper):   Patient reports indigestion/ heartburn. Patient denies nausea and vomiting.  Gastrointestinal (Lower):   Patient denies diarrhea and constipation.  Constitutional:   Patient denies fever, night sweats, weight loss, and fatigue.  Skin:   Patient denies skin rash/ lesion and itching.  Eyes:   Patient denies blurred vision and double vision.  Ears/ Nose/ Throat:   Patient denies sore  throat and sinus problems.  Hematologic/Lymphatic:   Patient reports easy bruising. Patient denies swollen glands.  Cardiovascular:   Patient denies leg swelling and chest pains.  Respiratory:   Patient denies cough and shortness of breath.  Endocrine:   Patient denies excessive thirst.  Musculoskeletal:   Patient reports back pain and joint pain.   Neurological:   Patient denies headaches and dizziness.  Psychologic:   Patient denies depression and anxiety.   VITAL SIGNS:      06/27/2017 01:39 PM  Weight 220 lb / 99.79 kg  Height 69 in / 175.26 cm  BP 155/73 mmHg  Pulse 81 /min  BMI 32.5 kg/m   MULTI-SYSTEM PHYSICAL EXAMINATION:    Constitutional: Well-nourished. No physical deformities. Normally developed. Good grooming.   Neck: Neck symmetrical, not swollen. Normal tracheal position. radiation of murmur to bilateral carotids  Respiratory: Normal breath sounds. No labored breathing, no use of accessory muscles.   Cardiovascular: II/VI Heart murmur. Normal temperature, normal extremity pulses, no swelling, no varicosities.   Lymphatic: No enlargement, no tenderness of axillae, groin, neck lymph nodes.  Skin: No paleness, no jaundice, no cyanosis. No lesion, no ulcer, no rash.   Neurologic / Psychiatric: Oriented to time, oriented to place, oriented to person. No depression, no anxiety, no agitation.   Gastrointestinal: No mass, no tenderness, no rigidity, non obese abdomen.   Eyes: Normal conjunctivae. Normal eyelids.   Ears, Nose, Mouth, and Throat: Left ear no scars, no lesions, no masses. Right ear no scars, no lesions, no masses. Nose no scars, no lesions, no masses. Normal hearing. Normal lips.   Musculoskeletal: Normal gait and station of head and neck.     PAST DATA REVIEWED:  Source Of History:  Patient  Records Review:   Previous Patient Records  Urine Test Review:   Urinalysis   06/27/17  Urinalysis  Urine Appearance Clear   Urine Color Yellow   Urine Glucose Neg    Urine Bilirubin Neg   Urine Ketones Trace   Urine Specific Gravity 1.025   Urine Blood Neg   Urine pH <=5.0   Urine Protein Trace   Urine Urobilinogen 0.2   Urine Nitrites Neg   Urine Leukocyte Esterase Neg    PROCEDURES:          Urinalysis Dipstick Dipstick Cont'd  Color: Yellow Bilirubin: Neg  Appearance: Clear Ketones: Trace  Specific Gravity: 1.025 Blood: Neg  pH: <=5.0 Protein: Trace  Glucose: Neg Urobilinogen: 0.2    Nitrites: Neg    Leukocyte Esterase: Neg    ASSESSMENT:      ICD-10 Details  1 GU:   Elevated PSA - R97.20   2   Prostate Cancer - C61    PLAN:           Schedule Return Visit/Planned Activity: Keep Scheduled Appointment - Schedule Surgery          Document Letter(s):  Created for  Patient: Clinical Summary         Notes:   Patient is scheduled to undergo placement of fiducial markers, radioactive seeds, and Space Oar.

## 2017-07-19 NOTE — Op Note (Signed)
Preoperative diagnosis: Adenocarcinoma of the prostate   Postoperative diagnosis: Same   Procedure: I-125 prostate seed implantation, Space Oar placement, cystoscopy  Surgeon: Baruch Gouty, M.D.   Radiation Oncologist: Tyler Pita, M.D.  Anesthesia: General  Drains: None  Complications: none  Indications: Clinically localized prostate cancer  Procedure: Patient was brought to operating suite and placement table in the supine position. At this time, a universal timeout protocol was performed, all team members were identified, Venodyne boots are placed, and he was administered IV Ancef in the preoperative period. He was placed in lithotomy position and prepped and draped in usual manner. Radiation oncology department placed a transrectal ultrasound probe anchoring stand/ grid and aligned with previous imaging from the volume study. Foley catheter was inserted without difficulty.  All needle passage was done with real-time transrectal ultrasound guidance in both the transverse and sagittal plains in order to achieve the desired preplanned position with the aid of the robotic arm. A total of 22 needles were placed.  69 active seeds were implanted. Then the Space Oar needle was introduced via the perineum into the fat plane between the prostate and the rectum.  Normal saline was puffed into this plane which showed good separation of the prostate and rectum.  The Space Oar gel was then infused into this plane with good spread bilaterally as well as proximally and distally.  The needle was then removed.  The Foley catheter was removed and a rigid cystoscopy failed to show any seeds outside the prostate without evidence of trauma to the urethral, prostatic fossa, or bladder.  The bladder was drained.  A fluoroscopic image was then obtained showing excellent distrubution of the brachytherapy seeds.  Each seed was counted and counts were correct.    The patient was then repositioned in the supine  position, reversed from anesthesia, and taken to the PACU in stable condition.

## 2017-07-20 ENCOUNTER — Encounter (HOSPITAL_BASED_OUTPATIENT_CLINIC_OR_DEPARTMENT_OTHER): Payer: Self-pay | Admitting: Urology

## 2017-07-21 ENCOUNTER — Telehealth: Payer: Self-pay | Admitting: *Deleted

## 2017-07-21 NOTE — Telephone Encounter (Signed)
CALLED PATIENT TO INFORM OF SIM ON 08-04-17 @ 11 AM AND HIS MRI ON 08-04-17 - ARRIVAL TIME - 2:45 PM @ [facility], NO RESTRICTIONS TO TEST, SPOKE WITH PATIENT AND HE IS AWARE OF THESE APPTS.

## 2017-08-04 ENCOUNTER — Encounter: Payer: Self-pay | Admitting: Medical Oncology

## 2017-08-04 ENCOUNTER — Ambulatory Visit (HOSPITAL_COMMUNITY)
Admission: RE | Admit: 2017-08-04 | Discharge: 2017-08-04 | Disposition: A | Discharge status: Home / Self Care | Payer: Medicare Other | Source: Ambulatory Visit | Attending: Urology | Admitting: Urology

## 2017-08-04 ENCOUNTER — Encounter: Payer: Self-pay | Admitting: Radiation Oncology

## 2017-08-04 ENCOUNTER — Ambulatory Visit
Admission: RE | Admit: 2017-08-04 | Discharge: 2017-08-04 | Disposition: A | Discharge status: Home / Self Care | Payer: Medicare Other | Source: Ambulatory Visit | Attending: Radiation Oncology | Admitting: Radiation Oncology

## 2017-08-04 ENCOUNTER — Other Ambulatory Visit: Payer: Self-pay

## 2017-08-04 DIAGNOSIS — Z510 Encounter for antineoplastic radiation therapy: Secondary | ICD-10-CM | POA: Diagnosis not present

## 2017-08-04 DIAGNOSIS — C61 Malignant neoplasm of prostate: Secondary | ICD-10-CM

## 2017-08-04 DIAGNOSIS — Z79899 Other long term (current) drug therapy: Secondary | ICD-10-CM | POA: Diagnosis not present

## 2017-08-04 NOTE — Progress Notes (Signed)
Patient returns for post seed follow up. Weight and vitals stable. Denies pain. Pre seed IPSS 22. Post seed IPSS 30. Reports his urine stream changed from steady to a dribble. Denies dysuria, hematuria, leakage or incontinence. Denies diarrhea. Received ADT in October. Patient scheduled in April to follow up with urologist and receive next ADT injection. Patient denies hot flashes. Reports taking flomax 0.4 mg daily. MRI to confirm SpaceOar placement scheduled for 3 pm today.   BP (!) 159/63 (BP Location: Left Arm, Patient Position: Sitting, Cuff Size: Normal)   Pulse 63   Temp 98.6 F (37 C) (Oral)   Resp 16   Wt 222 lb (100.7 kg)   SpO2 100%   BMI 32.78 kg/m  Wt Readings from Last 3 Encounters:  08/04/17 222 lb (100.7 kg)  07/19/17 217 lb 4.8 oz (98.6 kg)  04/25/17 223 lb 9.6 oz (101.4 kg)

## 2017-08-04 NOTE — Progress Notes (Signed)
  Radiation Oncology         [phone] ________________________________  Name: Cody Phelps MRN: [id number]  Date: 08/04/2017  DOB: [birth year]  SIMULATION AND TREATMENT PLANNING NOTE    ICD-10-CM   1. Malignant neoplasm of prostate ([place]) C61     DIAGNOSIS:  77 y.o. gentleman with stage T2aadenocarcinoma of the prostate with a Gleason's score of 4+4and a PSA of 4.43  NARRATIVE:  The patient was brought to the [facility].  Identity was confirmed.  All relevant records and images related to the planned course of therapy were reviewed.  The patient freely provided informed written consent to proceed with treatment after reviewing the details related to the planned course of therapy. The consent form was witnessed and verified by the simulation staff.  Then, the patient was set-up in a stable reproducible supine position for radiation therapy.  A vacuum lock pillow device was custom fabricated to position his legs in a reproducible immobilized position.  Then, I performed a urethrogram under sterile conditions to identify the prostatic apex.  CT images were obtained.  Surface markings were placed.  The CT images were loaded into the planning software.  Then the prostate target and avoidance structures including the rectum, bladder, bowel and hips were contoured.  Treatment planning then occurred.  The radiation prescription was entered and confirmed.  A total of one complex treatment devices were fabricated. I have requested : Intensity Modulated Radiotherapy (IMRT) is medically necessary for this case for the following reason:  Rectal sparing.Marland Kitchen  PLAN:  The patient will receive 45 Gy in 25 fractions of 1.8 Gy, to supplement an up-front prostate seed implant boost of 110 Gy to achieve a total nominal dose of 165 Gy.  ________________________________  Sheral Apley Tammi Klippel, M.D.

## 2017-08-07 NOTE — Progress Notes (Signed)
Radiation Oncology         [phone] ________________________________  Name: Cody Phelps MRN: [id number]  Date: 08/04/2017  DOB: Dec 30, 1940  Follow-Up Visit Note  CC: [name], MD  [name], MD  Diagnosis:   77 y.o.  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 Malignant neoplasm of prostate ([place]) C61     Interval Since Last Radiation:  3 weeks  Narrative:  The patient returns today for routine follow-up.  He is complaining of increased urinary frequency and urinary hesitation symptoms. He filled out a questionnaire regarding urinary function today providing and overall IPSS score of 30 characterizing his symptoms as severe.  His pre-implant score was 22. He denies any bowel symptoms.  ALLERGIES:  has No Known Allergies.  Meds: Current Outpatient Medications  Medication Sig Dispense Refill  . acetaminophen (TYLENOL) 500 MG tablet Take 1,000 mg by mouth every 6 (six) hours as needed.    . Amlodipine-Valsartan-HCTZ 10-160-12.5 MG TABS Take 1 tablet by mouth every morning.     . Ascorbic Acid (VITAMIN C) 1000 MG tablet Take 1,000 mg by mouth daily.    . colesevelam (WELCHOL) 625 MG tablet Take 1,875 mg by mouth 2 (two) times daily with a meal. Taking 3 tablets two times per day.    . donepezil (ARICEPT) 10 MG tablet Take 10 mg by mouth every morning.     Marland Kitchen esomeprazole (NEXIUM) 40 MG capsule Take 40 mg by mouth daily as needed (ACID).     Marland Kitchen ezetimibe-simvastatin (VYTORIN) 10-80 MG per tablet Take 1 tablet by mouth every morning.     . ferrous sulfate 325 (65 FE) MG EC tablet Take 325 mg by mouth daily with breakfast.    . fluticasone (FLONASE) 50 MCG/ACT nasal spray Place 1 spray into both nostrils daily as needed for allergies or rhinitis.    Marland Kitchen insulin glargine (LANTUS SOLOSTAR) 100 UNIT/ML injection Inject 40 Units into the skin at bedtime.     . nabumetone (RELAFEN) 750 MG tablet Take 750 mg  by mouth 2 (two) times daily as needed.     . naproxen sodium (ANAPROX) 220 MG tablet Take 220 mg by mouth as needed.     Marland Kitchen omega-3 acid ethyl esters (LOVAZA) 1 G capsule Take 2 g by mouth 2 (two) times daily.     . Pioglitazone HCl-Metformin HCl (ACTOPLUS MET XR) 30-1000 MG TB24 Take 1 tablet by mouth every morning.     Marland Kitchen rOPINIRole (REQUIP) 1 MG tablet Take 1 mg by mouth 2 (two) times daily.     . tamsulosin (FLOMAX) 0.4 MG CAPS capsule Take 0.4 mg by mouth daily after supper.      No current facility-administered medications for this encounter.     Physical Findings: The patient is in no acute distress. Patient is alert and oriented.  vitals were not taken for this visit. .  No significant changes.  Lab Findings: Lab Results  Component Value Date   WBC 5.0 07/03/2017   HGB 12.3 (L) 07/03/2017   HCT 36.3 (L) 07/03/2017   MCV 87.7 07/03/2017   PLT 241 07/03/2017    Radiographic Findings:  Patient underwent CT imaging in our clinic for post implant dosimetry. The CT appears to demonstrate an adequate distribution of radioactive seeds throughout the prostate gland. There no seeds in her near the rectum. I suspect the final radiation  plan and dosimetry will show appropriate coverage of the prostate gland.   Impression: The patient is recovering from the effects of radiation. His urinary symptoms should gradually improve over the next 4-6 months. We talked about this today. He is encouraged by his improvement already and is otherwise please with his outcome.   Plan: Today, I spent time talking to the patient about his prostate seed implant and resolving urinary symptoms. We also talked about long-term follow-up for prostate cancer following seed implant. He understands that ongoing PSA determinations and digital rectal exams will help perform surveillance to rule out disease recurrence. He is now going to start IMRT to treat the pelvic nodes to 45  Gy..  _____________________________________  Sheral Apley Tammi Klippel, M.D.

## 2017-08-10 DIAGNOSIS — C61 Malignant neoplasm of prostate: Secondary | ICD-10-CM | POA: Diagnosis not present

## 2017-08-14 ENCOUNTER — Encounter: Payer: Self-pay | Admitting: Radiation Oncology

## 2017-08-14 DIAGNOSIS — C61 Malignant neoplasm of prostate: Secondary | ICD-10-CM | POA: Diagnosis not present

## 2017-08-14 NOTE — Progress Notes (Signed)
  Radiation Oncology         [phone] ________________________________  Name: Cody Phelps MRN: [id number]  Date: 08/14/2017  DOB: Nov 17, 1940  3D Planning Note   Prostate Brachytherapy Post-Implant Dosimetry  Diagnosis: 77 y.o.  gentleman with stage T2a adenocarcinoma of the prostate with a Gleason's score of 4+4 and a PSA of 4.43  Narrative: On a previous date, Cody Phelps returned following prostate seed implantation for post implant planning. He underwent CT scan complex simulation to delineate the three-dimensional structures of the pelvis and demonstrate the radiation distribution.  Since that time, the seed localization, and complex isodose planning with dose volume histograms have now been completed.  Results:   Prostate Coverage - The dose of radiation delivered to the 90% or more of the prostate gland (D90) was 124.2% of the prescription dose. This exceeds our goal of greater than 90%. Rectal Sparing - The volume of rectal tissue receiving the prescription dose or higher was 0.0 cc. This falls under our thresholds tolerance of 1.0 cc.  Impression: The prostate seed implant appears to show adequate target coverage and appropriate rectal sparing.  Plan:  The patient will continue to follow with urology for ongoing PSA determinations. I would anticipate a high likelihood for local tumor control with minimal risk for rectal morbidity.  ________________________________  Sheral Apley Tammi Klippel, M.D.

## 2017-08-15 ENCOUNTER — Ambulatory Visit: Payer: Medicare Other

## 2017-08-15 DIAGNOSIS — C61 Malignant neoplasm of prostate: Secondary | ICD-10-CM | POA: Diagnosis not present

## 2017-08-16 ENCOUNTER — Ambulatory Visit
Admission: RE | Admit: 2017-08-16 | Discharge: 2017-08-16 | Disposition: A | Discharge status: Home / Self Care | Payer: Medicare Other | Source: Ambulatory Visit | Attending: Radiation Oncology | Admitting: Radiation Oncology

## 2017-08-16 DIAGNOSIS — C61 Malignant neoplasm of prostate: Secondary | ICD-10-CM | POA: Diagnosis not present

## 2017-08-17 ENCOUNTER — Ambulatory Visit
Admission: RE | Admit: 2017-08-17 | Discharge: 2017-08-17 | Disposition: A | Discharge status: Home / Self Care | Payer: Medicare Other | Source: Ambulatory Visit | Attending: Radiation Oncology | Admitting: Radiation Oncology

## 2017-08-17 DIAGNOSIS — C61 Malignant neoplasm of prostate: Secondary | ICD-10-CM | POA: Diagnosis not present

## 2017-08-18 ENCOUNTER — Ambulatory Visit
Admission: RE | Admit: 2017-08-18 | Discharge: 2017-08-18 | Disposition: A | Discharge status: Home / Self Care | Payer: Medicare Other | Source: Ambulatory Visit | Attending: Radiation Oncology | Admitting: Radiation Oncology

## 2017-08-18 DIAGNOSIS — C61 Malignant neoplasm of prostate: Secondary | ICD-10-CM | POA: Diagnosis not present

## 2017-08-21 ENCOUNTER — Ambulatory Visit
Admission: RE | Admit: 2017-08-21 | Discharge: 2017-08-21 | Disposition: A | Discharge status: Home / Self Care | Payer: Medicare Other | Source: Ambulatory Visit | Attending: Radiation Oncology | Admitting: Radiation Oncology

## 2017-08-21 DIAGNOSIS — C61 Malignant neoplasm of prostate: Secondary | ICD-10-CM | POA: Diagnosis not present

## 2017-08-22 ENCOUNTER — Ambulatory Visit
Admission: RE | Admit: 2017-08-22 | Discharge: 2017-08-22 | Disposition: A | Discharge status: Home / Self Care | Payer: Medicare Other | Source: Ambulatory Visit | Attending: Radiation Oncology | Admitting: Radiation Oncology

## 2017-08-22 DIAGNOSIS — C61 Malignant neoplasm of prostate: Secondary | ICD-10-CM | POA: Diagnosis not present

## 2017-08-23 ENCOUNTER — Ambulatory Visit
Admission: RE | Admit: 2017-08-23 | Discharge: 2017-08-23 | Disposition: A | Discharge status: Home / Self Care | Payer: Medicare Other | Source: Ambulatory Visit | Attending: Radiation Oncology | Admitting: Radiation Oncology

## 2017-08-23 DIAGNOSIS — C61 Malignant neoplasm of prostate: Secondary | ICD-10-CM | POA: Diagnosis not present

## 2017-08-24 ENCOUNTER — Ambulatory Visit
Admission: RE | Admit: 2017-08-24 | Discharge: 2017-08-24 | Disposition: A | Discharge status: Home / Self Care | Payer: Medicare Other | Source: Ambulatory Visit | Attending: Radiation Oncology | Admitting: Radiation Oncology

## 2017-08-24 DIAGNOSIS — C61 Malignant neoplasm of prostate: Secondary | ICD-10-CM | POA: Diagnosis not present

## 2017-08-25 ENCOUNTER — Ambulatory Visit
Admission: RE | Admit: 2017-08-25 | Discharge: 2017-08-25 | Disposition: A | Discharge status: Home / Self Care | Payer: Medicare Other | Source: Ambulatory Visit | Attending: Radiation Oncology | Admitting: Radiation Oncology

## 2017-08-25 DIAGNOSIS — C61 Malignant neoplasm of prostate: Secondary | ICD-10-CM | POA: Diagnosis not present

## 2017-08-28 ENCOUNTER — Ambulatory Visit
Admission: RE | Admit: 2017-08-28 | Discharge: 2017-08-28 | Disposition: A | Discharge status: Home / Self Care | Payer: Medicare Other | Source: Ambulatory Visit | Attending: Radiation Oncology | Admitting: Radiation Oncology

## 2017-08-28 DIAGNOSIS — C61 Malignant neoplasm of prostate: Secondary | ICD-10-CM | POA: Diagnosis not present

## 2017-08-29 ENCOUNTER — Ambulatory Visit
Admission: RE | Admit: 2017-08-29 | Discharge: 2017-08-29 | Disposition: A | Discharge status: Home / Self Care | Payer: Medicare Other | Source: Ambulatory Visit | Attending: Radiation Oncology | Admitting: Radiation Oncology

## 2017-08-29 DIAGNOSIS — C61 Malignant neoplasm of prostate: Secondary | ICD-10-CM | POA: Diagnosis not present

## 2017-08-30 ENCOUNTER — Ambulatory Visit
Admission: RE | Admit: 2017-08-30 | Discharge: 2017-08-30 | Disposition: A | Discharge status: Home / Self Care | Payer: Medicare Other | Source: Ambulatory Visit | Attending: Radiation Oncology | Admitting: Radiation Oncology

## 2017-08-30 DIAGNOSIS — C61 Malignant neoplasm of prostate: Secondary | ICD-10-CM | POA: Diagnosis not present

## 2017-08-31 ENCOUNTER — Ambulatory Visit
Admission: RE | Admit: 2017-08-31 | Discharge: 2017-08-31 | Disposition: A | Discharge status: Home / Self Care | Payer: Medicare Other | Source: Ambulatory Visit | Attending: Radiation Oncology | Admitting: Radiation Oncology

## 2017-08-31 DIAGNOSIS — C61 Malignant neoplasm of prostate: Secondary | ICD-10-CM | POA: Diagnosis not present

## 2017-09-01 ENCOUNTER — Ambulatory Visit
Admission: RE | Admit: 2017-09-01 | Discharge: 2017-09-01 | Disposition: A | Discharge status: Home / Self Care | Payer: Medicare Other | Source: Ambulatory Visit | Attending: Radiation Oncology | Admitting: Radiation Oncology

## 2017-09-01 DIAGNOSIS — Z510 Encounter for antineoplastic radiation therapy: Secondary | ICD-10-CM | POA: Diagnosis not present

## 2017-09-01 DIAGNOSIS — C61 Malignant neoplasm of prostate: Secondary | ICD-10-CM | POA: Diagnosis present

## 2017-09-04 ENCOUNTER — Ambulatory Visit
Admission: RE | Admit: 2017-09-04 | Discharge: 2017-09-04 | Disposition: A | Discharge status: Home / Self Care | Payer: Medicare Other | Source: Ambulatory Visit | Attending: Radiation Oncology | Admitting: Radiation Oncology

## 2017-09-04 DIAGNOSIS — Z510 Encounter for antineoplastic radiation therapy: Secondary | ICD-10-CM | POA: Diagnosis not present

## 2017-09-05 ENCOUNTER — Ambulatory Visit
Admission: RE | Admit: 2017-09-05 | Discharge: 2017-09-05 | Disposition: A | Discharge status: Home / Self Care | Payer: Medicare Other | Source: Ambulatory Visit | Attending: Radiation Oncology | Admitting: Radiation Oncology

## 2017-09-05 DIAGNOSIS — Z510 Encounter for antineoplastic radiation therapy: Secondary | ICD-10-CM | POA: Diagnosis not present

## 2017-09-06 ENCOUNTER — Ambulatory Visit
Admission: RE | Admit: 2017-09-06 | Discharge: 2017-09-06 | Disposition: A | Discharge status: Home / Self Care | Payer: Medicare Other | Source: Ambulatory Visit | Attending: Radiation Oncology | Admitting: Radiation Oncology

## 2017-09-06 DIAGNOSIS — Z510 Encounter for antineoplastic radiation therapy: Secondary | ICD-10-CM | POA: Diagnosis not present

## 2017-09-07 ENCOUNTER — Encounter: Payer: Self-pay | Admitting: Medical Oncology

## 2017-09-07 ENCOUNTER — Ambulatory Visit
Admission: RE | Admit: 2017-09-07 | Discharge: 2017-09-07 | Disposition: A | Discharge status: Home / Self Care | Payer: Medicare Other | Source: Ambulatory Visit | Attending: Radiation Oncology | Admitting: Radiation Oncology

## 2017-09-07 DIAGNOSIS — Z510 Encounter for antineoplastic radiation therapy: Secondary | ICD-10-CM | POA: Diagnosis not present

## 2017-09-08 ENCOUNTER — Ambulatory Visit
Admission: RE | Admit: 2017-09-08 | Discharge: 2017-09-08 | Disposition: A | Discharge status: Home / Self Care | Payer: Medicare Other | Source: Ambulatory Visit | Attending: Radiation Oncology | Admitting: Radiation Oncology

## 2017-09-08 DIAGNOSIS — Z510 Encounter for antineoplastic radiation therapy: Secondary | ICD-10-CM | POA: Diagnosis not present

## 2017-09-11 ENCOUNTER — Ambulatory Visit
Admission: RE | Admit: 2017-09-11 | Discharge: 2017-09-11 | Disposition: A | Discharge status: Home / Self Care | Payer: Medicare Other | Source: Ambulatory Visit | Attending: Radiation Oncology | Admitting: Radiation Oncology

## 2017-09-11 DIAGNOSIS — Z510 Encounter for antineoplastic radiation therapy: Secondary | ICD-10-CM | POA: Diagnosis not present

## 2017-09-12 ENCOUNTER — Ambulatory Visit
Admission: RE | Admit: 2017-09-12 | Discharge: 2017-09-12 | Disposition: A | Discharge status: Home / Self Care | Payer: Medicare Other | Source: Ambulatory Visit | Attending: Radiation Oncology | Admitting: Radiation Oncology

## 2017-09-12 DIAGNOSIS — Z510 Encounter for antineoplastic radiation therapy: Secondary | ICD-10-CM | POA: Diagnosis not present

## 2017-09-13 ENCOUNTER — Ambulatory Visit
Admission: RE | Admit: 2017-09-13 | Discharge: 2017-09-13 | Disposition: A | Discharge status: Home / Self Care | Payer: Medicare Other | Source: Ambulatory Visit | Attending: Radiation Oncology | Admitting: Radiation Oncology

## 2017-09-13 DIAGNOSIS — Z510 Encounter for antineoplastic radiation therapy: Secondary | ICD-10-CM | POA: Diagnosis not present

## 2017-09-14 ENCOUNTER — Ambulatory Visit
Admission: RE | Admit: 2017-09-14 | Discharge: 2017-09-14 | Disposition: A | Discharge status: Home / Self Care | Payer: Medicare Other | Source: Ambulatory Visit | Attending: Radiation Oncology | Admitting: Radiation Oncology

## 2017-09-14 DIAGNOSIS — Z510 Encounter for antineoplastic radiation therapy: Secondary | ICD-10-CM | POA: Diagnosis not present

## 2017-09-15 ENCOUNTER — Ambulatory Visit
Admission: RE | Admit: 2017-09-15 | Discharge: 2017-09-15 | Disposition: A | Discharge status: Home / Self Care | Payer: Medicare Other | Source: Ambulatory Visit | Attending: Radiation Oncology | Admitting: Radiation Oncology

## 2017-09-15 DIAGNOSIS — Z510 Encounter for antineoplastic radiation therapy: Secondary | ICD-10-CM | POA: Diagnosis not present

## 2017-09-18 ENCOUNTER — Encounter: Payer: Self-pay | Admitting: Radiation Oncology

## 2017-09-18 ENCOUNTER — Ambulatory Visit
Admission: RE | Admit: 2017-09-18 | Discharge: 2017-09-18 | Disposition: A | Discharge status: Home / Self Care | Payer: Medicare Other | Source: Ambulatory Visit | Attending: Radiation Oncology | Admitting: Radiation Oncology

## 2017-09-18 DIAGNOSIS — Z510 Encounter for antineoplastic radiation therapy: Secondary | ICD-10-CM | POA: Diagnosis not present

## 2017-09-20 NOTE — Progress Notes (Signed)
  Radiation Oncology         [phone] ________________________________  Name: Cody Phelps MRN: [id number]  Date: 09/18/2017  DOB: 1941-02-12  End of Treatment Note  Diagnosis:   77 y.o.gentleman with stage T2aadenocarcinoma of the prostate with a Gleason's score of 4+4and a PSA of 4.43.     Indication for treatment:  Curative, Definitive Radiotherapy       Radiation treatment dates:   Brachytherapy boost upfront on 08/04/2017, followed by 5 weeks of prostate IMRT 08/15/2017 to 09/18/2017.  Site/dose:   The patient received 45 Gy in 25 fractions of 1.8 Gy, to supplement an up-front prostate seed implant boost of 110 Gy to achieve a total nominal dose of 155 Gy.  Beams/energy:    1. 07/19/17:  Insertion of radioactive I-125 seeds into the prostate gland; 110 Gy, boost therapy.  2. The patient was treated with IMRT using volumetric arc therapy delivering 6 MV X-rays to clockwise and counterclockwise circumferential arcs with a 90 degree collimator offset to avoid dose scalloping.  Image guidance was performed with daily cone beam CT prior to each fraction to align to gold markers in the prostate and assure proper bladder and rectal fill volumes.  Immobilization was achieved with BodyFix custom mold.  Narrative: The patient tolerated radiation treatment relatively well. He experienced moderate fatigue and some urinary irritation including, nocturia every hour, unchanged mild dysuria, urinary stream with hesitancy, intermittency, weakness, and dribble. He denied hematuria, urinary leakage, or urgency. His LUTS were managed with Flomax 0.4 mg once each evening with dinner throughout his course of treatment. He did experience 2-3 episodes of diarrhea per day, managed with Imodium and reported continued chronic right hip pain.   Plan: The patient has completed radiation treatment. He will return to radiation oncology clinic for routine followup in one month. I advised him to call or return sooner  if he has any questions or concerns related to his recovery or treatment. ________________________________  Sheral Apley. Tammi Klippel, M.D.  This document serves as a record of services personally performed by Tyler Pita, MD. It was created on his behalf by Arlyce Harman, a trained medical scribe. The creation of this record is based on the scribe's personal observations and the provider's statements to them. This document has been checked and approved by the attending provider.

## 2017-10-05 ENCOUNTER — Other Ambulatory Visit: Payer: Self-pay | Admitting: Orthopedic Surgery

## 2017-10-10 NOTE — Pre-Procedure Instructions (Signed)
Cody Phelps  10/10/2017      [id number] - [place], [place] - [address] [place] [address] Phone: [phone] Fax: [phone]    Your procedure is scheduled on October 23, 2017  Report to [facility] Admitting at 1025 AM.  Call this number if you have problems the morning of surgery:  [phone]   Remember:  Do not eat food or drink liquids after midnight.  Take these medicines the morning of surgery with A SIP OF WATER acetaminophen (tylenol), donepezil (aricept), esomeprazole (nexium)  7 days prior to surgery STOP taking any Aspirin (unless otherwise instructed by your surgeon), Aleve, Naproxen, Ibuprofen, Motrin, Advil, Goody's, BC's, all herbal medications, fish oil, and all vitamins  Continue all other medications as instructed by your physician except follow the above medication instructions before surgery  WHAT DO I DO ABOUT MY DIABETES MEDICATION?  Marland Kitchen Do not take oral diabetes medicines (pills) the morning of surgery pioglitazone HCl-Metformin HCl (actoplus).  . THE NIGHT BEFORE SURGERY, take 20 units of lantus insulin (1/2 of your normal dose).      . The day of surgery, do not take other diabetes injectables, including Byetta (exenatide), Bydureon (exenatide ER), Victoza (liraglutide), or Trulicity (dulaglutide).  Reviewed and Endorsed by [facility] Patient Education Committee, August 2015  How to Manage Your Diabetes Before and After Surgery  Why is it important to control my blood sugar before and after surgery? . Improving blood sugar levels before and after surgery helps healing and can limit problems. . A way of improving blood sugar control is eating a healthy diet by: o  Eating less sugar and carbohydrates o  Increasing activity/exercise o  Talking with your doctor about reaching your blood sugar goals . High blood sugars (greater than 180 mg/dL) can raise your risk of infections and slow  your recovery, so you will need to focus on controlling your diabetes during the weeks before surgery. . Make sure that the doctor who takes care of your diabetes knows about your planned surgery including the date and location.  How do I manage my blood sugar before surgery? . Check your blood sugar at least 4 times a day, starting 2 days before surgery, to make sure that the level is not too high or low. o Check your blood sugar the morning of your surgery when you wake up and every 2 hours until you get to the Short Stay unit. . If your blood sugar is less than 70 mg/dL, you will need to treat for low blood sugar: o Do not take insulin. o Treat a low blood sugar (less than 70 mg/dL) with  cup of clear juice (cranberry or apple), 4 glucose tablets, OR glucose gel. Recheck blood sugar in 15 minutes after treatment (to make sure it is greater than 70 mg/dL). If your blood sugar is not greater than 70 mg/dL on recheck, call [phone] o  for further instructions. . Report your blood sugar to the short stay nurse when you get to Short Stay.  . If you are admitted to the hospital after surgery: o Your blood sugar will be checked by the staff and you will probably be given insulin after surgery (instead of oral diabetes medicines) to make sure you have good blood sugar levels. o The goal for blood sugar control after surgery is 80-180 mg/dL.   Do not wear jewelry..  Do not wear  lotions, powders, or colognes, or deodorant.  Men may shave face and neck.  Do not bring valuables to the hospital.  [facility] is not responsible for any belongings or valuables.  Contacts, dentures or bridgework may not be worn into surgery.  Leave your suitcase in the car.  After surgery it may be brought to your room.  For patients admitted to the hospital, discharge time will be determined by your treatment team.  Patients discharged the day of surgery will not be allowed to drive home.   [place]-  Preparing For Surgery  Before surgery, you can play an important role. Because skin is not sterile, your skin needs to be as free of germs as possible. You can reduce the number of germs on your skin by washing with CHG (chlorahexidine gluconate) Soap before surgery.  CHG is an antiseptic cleaner which kills germs and bonds with the skin to continue killing germs even after washing.  Please do not use if you have an allergy to CHG or antibacterial soaps. If your skin becomes reddened/irritated stop using the CHG.  Do not shave (including legs and underarms) for at least 48 hours prior to first CHG shower. It is OK to shave your face.  Please follow these instructions carefully.   1. Shower the NIGHT BEFORE SURGERY and the MORNING OF SURGERY with CHG.   2. If you chose to wash your hair, wash your hair first as usual with your normal shampoo.  3. After you shampoo, rinse your hair and body thoroughly to remove the shampoo.  4. Use CHG as you would any other liquid soap. You can apply CHG directly to the skin and wash gently with a scrungie or a clean washcloth.   5. Apply the CHG Soap to your body ONLY FROM THE NECK DOWN.  Do not use on open wounds or open sores. Avoid contact with your eyes, ears, mouth and genitals (private parts). Wash Face and genitals (private parts)  with your normal soap.  6. Wash thoroughly, paying special attention to the area where your surgery will be performed.  7. Thoroughly rinse your body with warm water from the neck down.  8. DO NOT shower/wash with your normal soap after using and rinsing off the CHG Soap.  9. Pat yourself dry with a CLEAN TOWEL.  10. Wear CLEAN PAJAMAS to bed the night before surgery, wear comfortable clothes the morning of surgery  11. Place CLEAN SHEETS on your bed the night of your first shower and DO NOT SLEEP WITH PETS.  Day of Surgery: Do not apply any deodorants/lotions. Please wear clean clothes to the hospital/surgery  center.    Please read over the following fact sheets that you were given. Pain Booklet, Coughing and Deep Breathing, MRSA Information and Surgical Site Infection Prevention

## 2017-10-11 ENCOUNTER — Other Ambulatory Visit: Payer: Self-pay

## 2017-10-11 ENCOUNTER — Encounter (HOSPITAL_COMMUNITY): Payer: Self-pay

## 2017-10-11 ENCOUNTER — Encounter (HOSPITAL_COMMUNITY)
Admission: RE | Admit: 2017-10-11 | Discharge: 2017-10-11 | Disposition: A | Discharge status: Home / Self Care | Payer: Medicare Other | Source: Ambulatory Visit | Attending: Orthopedic Surgery | Admitting: Orthopedic Surgery

## 2017-10-11 ENCOUNTER — Ambulatory Visit (HOSPITAL_COMMUNITY)
Admission: RE | Admit: 2017-10-11 | Discharge: 2017-10-11 | Disposition: A | Discharge status: Home / Self Care | Payer: Medicare Other | Source: Ambulatory Visit | Attending: Orthopedic Surgery | Admitting: Orthopedic Surgery

## 2017-10-11 DIAGNOSIS — Z01818 Encounter for other preprocedural examination: Secondary | ICD-10-CM | POA: Diagnosis not present

## 2017-10-11 DIAGNOSIS — E119 Type 2 diabetes mellitus without complications: Secondary | ICD-10-CM | POA: Insufficient documentation

## 2017-10-11 LAB — TYPE AND SCREEN
ABO/RH(D): A POS
Antibody Screen: NEGATIVE

## 2017-10-11 LAB — CBC WITH DIFFERENTIAL/PLATELET
Basophils Absolute: 0 10*3/uL (ref 0.0–0.1)
Basophils Relative: 1 %
Eosinophils Absolute: 0.1 10*3/uL (ref 0.0–0.7)
Eosinophils Relative: 2 %
HCT: 32.8 % — ABNORMAL LOW (ref 39.0–52.0)
Hemoglobin: 10.9 g/dL — ABNORMAL LOW (ref 13.0–17.0)
Lymphocytes Relative: 18 %
Lymphs Abs: 0.9 10*3/uL (ref 0.7–4.0)
MCH: 29.9 pg (ref 26.0–34.0)
MCHC: 33.2 g/dL (ref 30.0–36.0)
MCV: 89.9 fL (ref 78.0–100.0)
Monocytes Absolute: 0.4 10*3/uL (ref 0.1–1.0)
Monocytes Relative: 8 %
Neutro Abs: 3.5 10*3/uL (ref 1.7–7.7)
Neutrophils Relative %: 71 %
Platelets: 254 10*3/uL (ref 150–400)
RBC: 3.65 MIL/uL — ABNORMAL LOW (ref 4.22–5.81)
RDW: 13.7 % (ref 11.5–15.5)
WBC: 4.9 10*3/uL (ref 4.0–10.5)

## 2017-10-11 LAB — URINALYSIS, ROUTINE W REFLEX MICROSCOPIC
Bilirubin Urine: NEGATIVE
Glucose, UA: NEGATIVE mg/dL
Hgb urine dipstick: NEGATIVE
Ketones, ur: NEGATIVE mg/dL
Leukocytes, UA: NEGATIVE
Nitrite: NEGATIVE
Protein, ur: NEGATIVE mg/dL
Specific Gravity, Urine: 1.01 (ref 1.005–1.030)
pH: 5 (ref 5.0–8.0)

## 2017-10-11 LAB — APTT: aPTT: 30 seconds (ref 24–36)

## 2017-10-11 LAB — BASIC METABOLIC PANEL
Anion gap: 11 (ref 5–15)
BUN: 10 mg/dL (ref 6–20)
CO2: 23 mmol/L (ref 22–32)
Calcium: 9.2 mg/dL (ref 8.9–10.3)
Chloride: 104 mmol/L (ref 101–111)
Creatinine, Ser: 0.98 mg/dL (ref 0.61–1.24)
GFR calc Af Amer: >60 mL/min (ref >60)
GFR calc non Af Amer: >60 mL/min (ref >60)
Glucose, Bld: 143 mg/dL — ABNORMAL HIGH (ref 65–99)
Potassium: 3.3 mmol/L — ABNORMAL LOW (ref 3.5–5.1)
Sodium: 138 mmol/L (ref 135–145)

## 2017-10-11 LAB — SURGICAL PCR SCREEN
MRSA, PCR: NEGATIVE
Staphylococcus aureus: POSITIVE — AB

## 2017-10-11 LAB — PROTIME-INR
INR: 1.01
Prothrombin Time: 13.2 seconds (ref 11.4–15.2)

## 2017-10-11 LAB — GLUCOSE, CAPILLARY: Glucose-Capillary: 130 mg/dL — ABNORMAL HIGH (ref 65–99)

## 2017-10-11 NOTE — Progress Notes (Addendum)
Requested echo, ekg, last OV from Dr. [name].  Patient's wife stated he would have HGB A1c done on 10/13/17 and she will bring result Dos.

## 2017-10-12 NOTE — Progress Notes (Signed)
Anesthesia Chart Review:  Pt is a 77 year old male scheduled for R total hip arthroplasty anterior approach on 10/23/2017 with Frederik Pear, MD  - PCP is [name], MD - Cardiologist is Kela Millin, MD.  Last office visit 08/03/17.  PMH includes: Aortic stenosis (moderate), HTN, DM, hyperlipidemia, heart murmur, carotid stenosis, scoliosis, OSA, iron deficiency anemia prostate cancer (s/p brachytherapy 07/19/17), GERD.  Former smoker (quit in 1968).  BMI 31.5. S/p lumbar laminectomy 02/12/14.   Medications include: Amlodipine-valsartan-HCTZ, WelChol, Aricept, Nexium, Vytorin, iron, Lantus, pioglitazone-metformin  BP (!) 165/59   Pulse 87   Temp 36.8 C   Resp 20   Ht 5\' 9"  (1.753 m)   Wt 213 lb 14.4 oz (97 kg)   SpO2 95%   BMI 31.59 kg/m    Preoperative labs reviewed.    CXR 10/11/16: No active cardiopulmonary disease.  EKG 10/11/16: NSR. LVH with repolarization abnormality  Echo 08/24/17 [facility] cardiovascular): 1.  LV cavity normal in size.  Moderate concentric LVH.  Normal global wall motion.  Grade IAI diastolic dysfunction.  Calculated EF 68%. 2.  LA cavity mildly dilated. 3.  Mild aortic regurgitation.  Moderate calcification of the aortic valve annulus.  Moderate aortic valve leaflet calcification.  Moderately restricted aortic valve leaflets.  Moderate aortic valve stenosis with peak gradient 49, mean gradient 27 mmHg.  Calculated valve area is 1.29 cm. 4.  Mild mitral regurgitation.  Moderate calcification of the mitral valve annulus.  Mild calcification of the mitral leaflets.  Mildly restricted mitral valve leaflets, mild mitral stenosis with mean gradient 5.6 mmHg.  Calculated valve area 2.5 cm. 5.  Trace tricuspid regurgitation. 6.  No significant change in severity of aortic stenosis from prior echo 01/01/17  Carotid duplex 07/05/17: By notes: - mild heterogeneous plaque in the left ICA probably minimal stenosis. - Antegrade bilateral vertebral artery  flow.  Nuclear stress test 04/27/12: By notes: 1.  Strongly positive EKG change.  Hypertensive.  Normal perfusion.  Low risk.  If no changes, I anticipate pt can proceed with surgery as scheduled.   Willeen Cass, FNP-BC [facility]/Anesthesiology Phone: [phone] 10/12/2017 12:45 PM

## 2017-10-12 NOTE — Progress Notes (Signed)
Mup[facility]cin [facility] [place] for positive PCR of Staph. Pt notified and voiced understanding. 

## 2017-10-17 ENCOUNTER — Encounter: Payer: Self-pay | Admitting: Urology

## 2017-10-17 ENCOUNTER — Other Ambulatory Visit: Payer: Self-pay

## 2017-10-17 ENCOUNTER — Ambulatory Visit
Admission: RE | Admit: 2017-10-17 | Discharge: 2017-10-17 | Disposition: A | Discharge status: Home / Self Care | Payer: Medicare Other | Source: Ambulatory Visit | Attending: Urology | Admitting: Urology

## 2017-10-17 VITALS — BP 165/72 | HR 82 | Temp 98.2°F | Resp 20 | Ht 69.0 in | Wt 213.4 lb

## 2017-10-17 DIAGNOSIS — M25551 Pain in right hip: Secondary | ICD-10-CM | POA: Insufficient documentation

## 2017-10-17 DIAGNOSIS — R3916 Straining to void: Secondary | ICD-10-CM | POA: Insufficient documentation

## 2017-10-17 DIAGNOSIS — R3911 Hesitancy of micturition: Secondary | ICD-10-CM | POA: Diagnosis not present

## 2017-10-17 DIAGNOSIS — R351 Nocturia: Secondary | ICD-10-CM | POA: Diagnosis not present

## 2017-10-17 DIAGNOSIS — R300 Dysuria: Secondary | ICD-10-CM | POA: Insufficient documentation

## 2017-10-17 DIAGNOSIS — R197 Diarrhea, unspecified: Secondary | ICD-10-CM | POA: Insufficient documentation

## 2017-10-17 DIAGNOSIS — R350 Frequency of micturition: Secondary | ICD-10-CM | POA: Diagnosis not present

## 2017-10-17 DIAGNOSIS — Z79899 Other long term (current) drug therapy: Secondary | ICD-10-CM | POA: Insufficient documentation

## 2017-10-17 DIAGNOSIS — Z794 Long term (current) use of insulin: Secondary | ICD-10-CM | POA: Diagnosis not present

## 2017-10-17 DIAGNOSIS — C61 Malignant neoplasm of prostate: Secondary | ICD-10-CM | POA: Insufficient documentation

## 2017-10-17 DIAGNOSIS — R3915 Urgency of urination: Secondary | ICD-10-CM | POA: Insufficient documentation

## 2017-10-17 DIAGNOSIS — G8929 Other chronic pain: Secondary | ICD-10-CM | POA: Diagnosis not present

## 2017-10-17 NOTE — Progress Notes (Signed)
Radiation Oncology         [phone] ________________________________  Name: Cody Phelps MRN: [id number]  Date: 10/17/2017  DOB: 1940-09-07  Post Treatment Note  CC: [name], MD  Raynelle Bring, MD  Diagnosis:   77 y.o.gentleman with stage T2aadenocarcinoma of the prostate with a Gleason's score of 4+4and a PSA of 4.43.     Interval Since Last Radiation:  4 weeks 08/15/2017 to 09/18/2017:The patient received 45 Gy in 25 fractions of 1.8 Gy, to supplement an up-front prostate seed implantboostof 110 Gy to achieve a total nominal dose of 155 Gy.  07/19/17:  Insertion of radioactive I-125 seeds into the prostate gland;110Gy, boost therapy with placement of SpaceOAR gel.  Narrative:  The patient returns today for routine follow-up.  He tolerated radiation treatment relatively well. He experienced moderate fatigue and some urinary irritation including, nocturia every hour, unchanged mild dysuria, urinary stream with hesitancy, intermittency, weakness, and dribble. He denied hematuria, urinary leakage, or urgency. His LUTS were managed with Flomax 0.4 mg once each evening with dinner throughout his course of treatment. He did experience 2-3 episodes of diarrhea per day, managed with Imodium and reported continued chronic right hip pain.  He remained on ADT throughout his course of treatment.                              On review of systems, the patient states that he continues with significant lower urinary tract symptoms.  His current IPSS score is 29 indicating severe urinary symptoms.  He is most bothered with increased frequency, urgency and nocturia, getting up hourly at night.  He also continues with a weak stream, occasionally straining to void, intermittency and feelings of incomplete emptying.  He has continued taking Flomax daily with supper.  He admits that his symptoms have not progressively worsened but have not significantly improved since completion of treatment.  He  denies dysuria or gross hematuria.  He has not had recent fevers, chills or night sweats.  He denies abdominal pain, nausea, vomiting or diarrhea.  He remains on androgen deprivation therapy which he tolerates fairly well aside from hot flashes and fatigue.  He has a scheduled follow-up visit with Dr. Pilar Jarvis on November 15, 2017.  ALLERGIES:  has No Known Allergies.  Meds: Current Outpatient Medications  Medication Sig Dispense Refill  . acetaminophen (TYLENOL) 500 MG tablet Take 1,000 mg by mouth daily as needed for moderate pain or headache.     . Amlodipine-Valsartan-HCTZ 10-160-12.5 MG TABS Take 1 tablet by mouth every morning.     . colesevelam (WELCHOL) 625 MG tablet Take 1,875 mg by mouth daily.     Marland Kitchen donepezil (ARICEPT) 10 MG tablet Take 10 mg by mouth every morning.     Marland Kitchen esomeprazole (NEXIUM) 40 MG capsule Take 40 mg by mouth daily as needed (acid reflux).     Marland Kitchen ezetimibe-simvastatin (VYTORIN) 10-80 MG per tablet Take 1 tablet by mouth every morning.     . ferrous sulfate 325 (65 FE) MG EC tablet Take 325 mg by mouth 2 (two) times a week.     . insulin glargine (LANTUS SOLOSTAR) 100 UNIT/ML injection Inject 40 Units into the skin at bedtime.     . Pioglitazone HCl-Metformin HCl (ACTOPLUS MET XR) 30-1000 MG TB24 Take 1 tablet by mouth every morning.     Marland Kitchen rOPINIRole (REQUIP) 2 MG tablet Take 1 mg by mouth 2 (two) times  daily.    . tamsulosin (FLOMAX) 0.4 MG CAPS capsule Take 0.4 mg by mouth daily after supper.     . Ascorbic Acid (VITAMIN C) 1000 MG tablet Take 1,000 mg by mouth 2 (two) times a week.     . nabumetone (RELAFEN) 750 MG tablet Take 750 mg by mouth daily as needed for mild pain.     Marland Kitchen omega-3 acid ethyl esters (LOVAZA) 1 G capsule Take 2 g by mouth 2 (two) times daily.      No current facility-administered medications for this encounter.     Physical Findings:  height is 5' 9"  (1.753 m) and weight is 213 lb 6.4 oz (96.8 kg). His oral temperature is 98.2 F (36.8 C). His  blood pressure is 165/72 (abnormal) and his pulse is 82. His respiration is 20 and oxygen saturation is 98%.  Pain Assessment Pain Score: 7  Pain Loc: Hip(Right hip)/10 In general this is a well appearing Caucasian male in no acute distress.  He's alert and oriented x4 and appropriate throughout the examination. Cardiopulmonary assessment is negative for acute distress and he exhibits normal effort.   Lab Findings: Lab Results  Component Value Date   WBC 4.9 10/11/2017   HGB 10.9 (L) 10/11/2017   HCT 32.8 (L) 10/11/2017   MCV 89.9 10/11/2017   PLT 254 10/11/2017     Radiographic Findings: Dg Chest 2 View  Result Date: 10/12/2017 CLINICAL DATA:  Preoperative evaluation for upcoming hip surgery EXAM: CHEST - 2 VIEW COMPARISON:  06/08/2017 FINDINGS: Cardiac shadow is stable. Minimal scarring is again noted in the left base. No focal infiltrate or sizable effusion is seen. No acute bony abnormality is noted. IMPRESSION: No active cardiopulmonary disease. Electronically Signed   By: Inez Catalina M.D.   On: 10/12/2017 08:29    Impression/Plan: 1. 77 y.o.gentleman with stage T2aadenocarcinoma of the prostate with a Gleason's score of 4+4and a PSA of 4.43.    He will continue to follow up with urology for ongoing PSA determinations and has an appointment scheduled with Dr. Pilar Jarvis on November 17, 2017.  We discussed specific foods and fluids that could be potential bladder irritants that he should avoid or at least, limit.  He is willing to try to make some dietary changes to see if this improves his urinary symptoms.  We also discussed increasing his Flomax to twice daily dosing which he is interesting in trying as well.  He will begin taking 1 Flomax capsule in the morning and 1 in the evening with his supper starting tomorrow.  He plans to complete a 2-year course of androgen deprivation therapy.  He understands what to expect with regards to PSA monitoring going forward. I will look forward to  following his response to treatment via correspondence with urology, and would be happy to continue to participate in his care if clinically indicated. I talked to the patient about what to expect in the future, including his risk for erectile dysfunction and rectal bleeding. I encouraged him to call or return to the office if he has any questions regarding his previous radiation or possible radiation side effects. He was comfortable with this plan and will follow up as needed.    Nicholos Johns, PA-C

## 2017-10-18 DIAGNOSIS — M1611 Unilateral primary osteoarthritis, right hip: Secondary | ICD-10-CM | POA: Diagnosis present

## 2017-10-18 NOTE — H&P (Signed)
TOTAL HIP ADMISSION H&P  Patient is admitted for right total hip arthroplasty.  Subjective:  Chief Complaint: right hip pain  HPI: Cody Phelps, 77 y.o. male, has a history of pain and functional disability in the right hip(s) due to arthritis and patient has failed non-surgical conservative treatments for greater than 12 weeks to include NSAID's and/or analgesics, corticosteriod injections, use of assistive devices and activity modification.  Onset of symptoms was gradual starting 2 years ago with gradually worsening course since that time.The patient noted no past surgery on the right hip(s).  Patient currently rates pain in the right hip at 10 out of 10 with activity. Patient has night pain, worsening of pain with activity and weight bearing, trendelenberg gait, pain that interfers with activities of daily living and pain with passive range of motion. Patient has evidence of joint space narrowing by imaging studies. This condition presents safety issues increasing the risk of falls.  There is no current active infection.  Patient Active Problem List   Diagnosis Date Noted  . Malignant neoplasm of prostate ([place]) 04/21/2017  . RLS (restless legs syndrome) 11/20/2014  . Lumbar stenosis with neurogenic claudication 02/12/2014  . Obstructive sleep apnea 11/25/2013  . DM (diabetes mellitus) type 2, uncontrolled, with ketoacidosis ([place]) 11/25/2013  . HTN (hypertension) 11/25/2013  . Bronchitis, acute 05/04/2012  . Lymphadenopathy 05/04/2012  . OTHER CHRONIC SINUSITIS 08/08/2008  . RHINITIS 08/05/2008  . SLEEP APNEA 08/05/2008  . FOOT PAIN, RIGHT 06/10/2008  . DIABETES MELLITUS, TYPE II 08/31/2006  . HYPERTENSION 08/31/2006  . DIVERTICULOSIS, COLON 08/31/2006  . LEG CRAMPS 08/31/2006   Past Medical History:  Diagnosis Date  . Aortic valve stenosis, moderate cardiologist-- ganji   last echo 01-11-2017 by dr [name]-- moderate AV leaflet restricted w/  moderate AV stenosis, AVA 1.16cm^2  .  Benign localized prostatic hyperplasia with lower urinary tract symptoms (LUTS)   . Carotid stenosis, left followed by dr [name], cardiologist--- bilateral bruit per note   per dr [name] note [id number]  LICA 88-89% stenosis per duplex 05-14-2014  . Diverticulosis   . ED (erectile dysfunction)   . First degree heart block   . GERD (gastroesophageal reflux disease)   . Heart murmur   . History of pneumonia 08-22-2016 & 09-01-2016  . Hypertension    cardiologsit-  dr [name]  . Iron deficiency anemia   . Mixed hyperlipidemia   . Neuromuscular disorder ([name])    restless lrg  . OA (osteoarthritis)    right hip  . OSA on CPAP followed by dr dohmeier   per study's 02-08-2005 & 10-29-2008  severe osa uses cpap  . Prostate cancer [facility]) urologist-  dr budzyn/  oncologist-  dr Tammi Klippel   dx 03-17-2017 (bx)  Stage T2a, Gleason 4+4, PSA  4.43, vol 32.32--  scheduled for radiatctive prostate seed implants 07-10-2017 then IMRT and ADT  . RLS (restless legs syndrome)   . Scoliosis   . Trigger finger   . Type 2 diabetes mellitus ([place])   . Wears partial dentures    upper and lower    Past Surgical History:  Procedure Laterality Date  . APPENDECTOMY  1988  . BILATERAL TOTAL ETHMOIDECTOMY AND SPHENOIDECTOMY  01-14-2009    dr bates   [facility]  . CARPAL TUNNEL RELEASE Bilateral 2013  . CATARACT EXTRACTION W/ INTRAOCULAR LENS  IMPLANT, BILATERAL  date?  . CYSTOSCOPY  07/19/2017   Procedure: CYSTOSCOPY;  Surgeon: Nickie Retort, MD;  Location: [facility];  Service:  Urology;;  No seeds found in blader  . LAMINECTOMY WITH POSTERIOR LATERAL ARTHRODESIS LEVEL 2 N/A 02/12/2014   Procedure: LUMBAR TWO-THREE,LUIMBAR THREE-FOUR LAMINECTOMY/FORAMINOTOMY;POSSIBLE POSTEROLATERAL ARTHRODESIS WITH AUTOGRAFT;  Surgeon: [name], MD;  Location: [facility]UR[facility];  Service: Neurosurgery;  Laterality: N/A;  . ORIF RIGHT ANKLE FX'S  05/05/2001   retained hardware  . PROSTATE BIOPSY  03-17-2017   dr Pilar Jarvis  office  . RADIOACTIVE SEED IMPLANT N/A 07/19/2017   Procedure: RADIOACTIVE SEED IMPLANT/BRACHYTHERAPY IMPLANT;  Surgeon: Nickie Retort, MD;  Location: [facility];  Service: Urology;  Laterality: N/A;  69 seeds implanted  . SPACE OAR INSTILLATION N/A 07/19/2017   Procedure: SPACE OAR INSTILLATION;  Surgeon: Nickie Retort, MD;  Location: [facility];  Service: Urology;  Laterality: N/A;  . TEE WITHOUT CARDIOVERSION  03/20/2012   Procedure: TRANSESOPHAGEAL ECHOCARDIOGRAM (TEE);  Surgeon: Laverda Page, MD;  Location: [facility] ENDOSCOPY;  Service: Cardiovascular;  Laterality: N/A;  normal LV; normal EF; normal RV; normal LA w/ left atrial appendage very small, normal function, interatrial septum intact without defect; normal RA; trace MR,TR, & PI; mild AV calcification and senile degeneration w/ mild stenosis, AVA 1.7cm^2;;   . TRANSTHORACIC ECHOCARDIOGRAM  01-11-2017   dr [name]  (per echo note, no significant change in seveity of AS, no other diagnostic change)   moderate concentric LVH, ef 47%, grade 1 diastolic dysfunction/  moderate LAE/  mild , grade 1 AR w/ moderate AV calcification, mild to moderate restricted AV leaflets w/ moderate AS, AVA 1.16cm^2, peak grandient 26mHg, mean grandient 372mg/  trace MR, mild calcification MV annulus , mild MV leaflet calcification, mild MVS, peak grandient 4.66m3m, mean grandient 2.7mm53m trace TR    No current facility-administered medications for this encounter.    Current Outpatient Medications  Medication Sig Dispense Refill Last Dose  . acetaminophen (TYLENOL) 500 MG tablet Take 1,000 mg by mouth daily as needed for moderate pain or headache.    Taking  . Amlodipine-Valsartan-HCTZ 10-160-12.5 MG TABS Take 1 tablet by mouth every morning.    Taking  . Ascorbic Acid (VITAMIN C) 1000 MG tablet Take 1,000 mg by mouth 2 (two) times a week.    Not Taking  . colesevelam (WELCHOL) 625 MG tablet Take 1,875 mg by mouth  daily.    Taking  . donepezil (ARICEPT) 10 MG tablet Take 10 mg by mouth every morning.    Taking  . esomeprazole (NEXIUM) 40 MG capsule Take 40 mg by mouth daily as needed (acid reflux).    Taking  . ezetimibe-simvastatin (VYTORIN) 10-80 MG per tablet Take 1 tablet by mouth every morning.    Taking  . ferrous sulfate 325 (65 FE) MG EC tablet Take 325 mg by mouth 2 (two) times a week.    Taking  . insulin glargine (LANTUS SOLOSTAR) 100 UNIT/ML injection Inject 40 Units into the skin at bedtime.    Taking  . nabumetone (RELAFEN) 750 MG tablet Take 750 mg by mouth daily as needed for mild pain.    Not Taking  . omega-3 acid ethyl esters (LOVAZA) 1 G capsule Take 2 g by mouth 2 (two) times daily.    Not Taking  . Pioglitazone HCl-Metformin HCl (ACTOPLUS MET XR) 30-1000 MG TB24 Take 1 tablet by mouth every morning.    Taking  . rOPINIRole (REQUIP) 2 MG tablet Take 1 mg by mouth 2 (two) times daily.   Taking  . tamsulosin (FLOMAX) 0.4 MG CAPS capsule Take  0.4 mg by mouth daily after supper.    Taking   No Known Allergies  Social History   Tobacco Use  . Smoking status: Former Smoker    Years: 1.00    Types: Cigarettes    Last attempt to quit: 02/04/1967    Years since quitting: 50.7  . Smokeless tobacco: Never Used  . Tobacco comment: smoked 1 q2-3 days  Substance Use Topics  . Alcohol use: Yes    Comment: occas.    Family History  Problem Relation Age of Onset  . Stroke Mother   . Hypertension Mother   . Heart attack Father   . Heart murmur Father   . Hypertension Sister      Review of Systems  Constitutional: Negative.   HENT: Negative.   Eyes: Negative.   Respiratory: Negative.   Cardiovascular:       Heart murmur, HTN  Gastrointestinal: Positive for diarrhea.  Genitourinary: Positive for frequency and urgency.  Musculoskeletal: Positive for joint pain.  Skin: Negative.   Neurological: Negative.   Endo/Heme/Allergies: Negative.   Psychiatric/Behavioral: Negative.      Objective:  Physical Exam  Constitutional: He is oriented to person, place, and time. He appears well-developed and well-nourished.  HENT:  Head: Normocephalic and atraumatic.  Eyes: Pupils are equal, round, and reactive to light.  Neck: Normal range of motion. Neck supple.  Cardiovascular: Intact distal pulses.  Respiratory: Effort normal.  Musculoskeletal: He exhibits tenderness.  patient has significant pain with any attempted internal rotation of the right hip.  Pain with palpation of the groin.  He can internally rotate to approximately 10.  External rotation to approximately 15-20.  He will bump does cause increased pain.  Calves are soft and nontender.  Neurological: He is alert and oriented to person, place, and time.  Skin: Skin is warm and dry.  Psychiatric: He has a normal mood and affect. His behavior is normal. Judgment and thought content normal.    Vital signs in last 24 hours: Temp:  [98.2 F (36.8 C)] 98.2 F (36.8 C) (03/19 1407) Pulse Rate:  [82] 82 (03/19 1407) Resp:  [20] 20 (03/19 1407) BP: (165)/(72) 165/72 (03/19 1407) SpO2:  [98 %] 98 % (03/19 1407) Weight:  [96.8 kg (213 lb 6.4 oz)] 96.8 kg (213 lb 6.4 oz) (03/19 1407)  Labs:   Estimated body mass index is 31.51 kg/m as calculated from the following:   Height as of 10/17/17: 5' 9"  (1.753 m).   Weight as of 10/17/17: 96.8 kg (213 lb 6.4 oz).   Imaging Review Plain radiographs demonstrate  near bone-on-bone changes over the superior weightbearing surface of the right hip.  Assessment/Plan:  End stage arthritis, right hip(s)  The patient history, physical examination, clinical judgement of the provider and imaging studies are consistent with end stage degenerative joint disease of the right hip(s) and total hip arthroplasty is deemed medically necessary. The treatment options including medical management, injection therapy, arthroscopy and arthroplasty were discussed at length. The risks and  benefits of total hip arthroplasty were presented and reviewed. The risks due to aseptic loosening, infection, stiffness, dislocation/subluxation,  thromboembolic complications and other imponderables were discussed.  The patient acknowledged the explanation, agreed to proceed with the plan and consent was signed. Patient is being admitted for inpatient treatment for surgery, pain control, PT, OT, prophylactic antibiotics, VTE prophylaxis, progressive ambulation and ADL's and discharge planning.The patient is planning to be discharged home with home health services.

## 2017-10-20 ENCOUNTER — Other Ambulatory Visit: Payer: Self-pay | Admitting: Orthopedic Surgery

## 2017-10-20 MED ORDER — TRANEXAMIC ACID 1000 MG/10ML IV SOLN
2000.0000 mg | INTRAVENOUS | Status: AC
  Administered 2017-10-23: 2000 mg via TOPICAL
  Filled 2017-10-20: qty 20

## 2017-10-20 MED ORDER — TRANEXAMIC ACID 1000 MG/10ML IV SOLN
1000.0000 mg | INTRAVENOUS | Status: AC
  Administered 2017-10-23: 1000 mg via INTRAVENOUS
  Filled 2017-10-20: qty 1100

## 2017-10-20 MED ORDER — BUPIVACAINE LIPOSOME 1.3 % IJ SUSP
20.0000 mL | INTRAMUSCULAR | Status: AC
  Administered 2017-10-23: 20 mL
  Filled 2017-10-20: qty 20

## 2017-10-23 ENCOUNTER — Inpatient Hospital Stay (HOSPITAL_COMMUNITY): Payer: Medicare Other

## 2017-10-23 ENCOUNTER — Inpatient Hospital Stay (HOSPITAL_COMMUNITY): Payer: Medicare Other | Admitting: Anesthesiology

## 2017-10-23 ENCOUNTER — Encounter (HOSPITAL_COMMUNITY): Payer: Self-pay

## 2017-10-23 ENCOUNTER — Encounter (HOSPITAL_COMMUNITY)
Admission: RE | Disposition: A | Discharge status: Home Health | Payer: Self-pay | Source: Ambulatory Visit | Attending: Orthopedic Surgery

## 2017-10-23 ENCOUNTER — Inpatient Hospital Stay (HOSPITAL_COMMUNITY)
Admission: RE | Admit: 2017-10-23 | Discharge: 2017-10-25 | DRG: 470 | Disposition: A | Discharge status: Home Health | Payer: Medicare Other | Source: Ambulatory Visit | Attending: Orthopedic Surgery | Admitting: Orthopedic Surgery

## 2017-10-23 ENCOUNTER — Inpatient Hospital Stay (HOSPITAL_COMMUNITY): Payer: Medicare Other | Admitting: Emergency Medicine

## 2017-10-23 DIAGNOSIS — M1611 Unilateral primary osteoarthritis, right hip: Secondary | ICD-10-CM | POA: Diagnosis not present

## 2017-10-23 DIAGNOSIS — K219 Gastro-esophageal reflux disease without esophagitis: Secondary | ICD-10-CM | POA: Diagnosis not present

## 2017-10-23 DIAGNOSIS — I6522 Occlusion and stenosis of left carotid artery: Secondary | ICD-10-CM | POA: Diagnosis present

## 2017-10-23 DIAGNOSIS — I350 Nonrheumatic aortic (valve) stenosis: Secondary | ICD-10-CM | POA: Diagnosis present

## 2017-10-23 DIAGNOSIS — I10 Essential (primary) hypertension: Secondary | ICD-10-CM | POA: Diagnosis not present

## 2017-10-23 DIAGNOSIS — Z9181 History of falling: Secondary | ICD-10-CM

## 2017-10-23 DIAGNOSIS — Z794 Long term (current) use of insulin: Secondary | ICD-10-CM

## 2017-10-23 DIAGNOSIS — G4733 Obstructive sleep apnea (adult) (pediatric): Secondary | ICD-10-CM | POA: Diagnosis present

## 2017-10-23 DIAGNOSIS — E119 Type 2 diabetes mellitus without complications: Secondary | ICD-10-CM | POA: Diagnosis not present

## 2017-10-23 DIAGNOSIS — Z923 Personal history of irradiation: Secondary | ICD-10-CM | POA: Diagnosis not present

## 2017-10-23 DIAGNOSIS — D62 Acute posthemorrhagic anemia: Secondary | ICD-10-CM | POA: Diagnosis not present

## 2017-10-23 DIAGNOSIS — Z87891 Personal history of nicotine dependence: Secondary | ICD-10-CM | POA: Diagnosis not present

## 2017-10-23 DIAGNOSIS — Z79899 Other long term (current) drug therapy: Secondary | ICD-10-CM

## 2017-10-23 DIAGNOSIS — Z419 Encounter for procedure for purposes other than remedying health state, unspecified: Secondary | ICD-10-CM

## 2017-10-23 DIAGNOSIS — G2581 Restless legs syndrome: Secondary | ICD-10-CM | POA: Diagnosis not present

## 2017-10-23 DIAGNOSIS — Z8546 Personal history of malignant neoplasm of prostate: Secondary | ICD-10-CM | POA: Diagnosis not present

## 2017-10-23 HISTORY — PX: TOTAL HIP ARTHROPLASTY: SHX124

## 2017-10-23 LAB — GLUCOSE, CAPILLARY
Glucose-Capillary: 116 mg/dL — ABNORMAL HIGH (ref 65–99)
Glucose-Capillary: 122 mg/dL — ABNORMAL HIGH (ref 65–99)
Glucose-Capillary: 196 mg/dL — ABNORMAL HIGH (ref 65–99)
Glucose-Capillary: 157 mg/dL — ABNORMAL HIGH (ref 65–99)

## 2017-10-23 LAB — HEMOGLOBIN A1C
Hgb A1c MFr Bld: 5.7 % — ABNORMAL HIGH (ref 4.8–5.6)
Mean Plasma Glucose: 116.89 mg/dL

## 2017-10-23 SURGERY — ARTHROPLASTY, HIP, TOTAL, ANTERIOR APPROACH
Anesthesia: Spinal | Site: Hip | Laterality: Right

## 2017-10-23 MED ORDER — PANTOPRAZOLE SODIUM 40 MG PO TBEC
80.0000 mg | DELAYED_RELEASE_TABLET | Freq: Every day | ORAL | Status: DC
  Administered 2017-10-24: 80 mg via ORAL
  Filled 2017-10-23: qty 2

## 2017-10-23 MED ORDER — CELECOXIB 200 MG PO CAPS
200.0000 mg | ORAL_CAPSULE | Freq: Two times a day (BID) | ORAL | Status: DC
  Administered 2017-10-23 – 2017-10-25 (×4): 200 mg via ORAL
  Filled 2017-10-23 (×6): qty 1

## 2017-10-23 MED ORDER — DOCUSATE SODIUM 100 MG PO CAPS
100.0000 mg | ORAL_CAPSULE | Freq: Two times a day (BID) | ORAL | Status: DC
  Administered 2017-10-23 – 2017-10-25 (×3): 100 mg via ORAL
  Filled 2017-10-23 (×4): qty 1

## 2017-10-23 MED ORDER — INSULIN ASPART 100 UNIT/ML ~~LOC~~ SOLN
0.0000 [IU] | Freq: Three times a day (TID) | SUBCUTANEOUS | Status: DC
  Administered 2017-10-23: 2 [IU] via SUBCUTANEOUS
  Administered 2017-10-24: 3 [IU] via SUBCUTANEOUS
  Administered 2017-10-24: 2 [IU] via SUBCUTANEOUS
  Administered 2017-10-24: 3 [IU] via SUBCUTANEOUS
  Administered 2017-10-25: 2 [IU] via SUBCUTANEOUS

## 2017-10-23 MED ORDER — FERROUS SULFATE 325 (65 FE) MG PO TABS: 325.0000 mg | ORAL_TABLET | ORAL | Status: DC

## 2017-10-23 MED ORDER — POLYETHYLENE GLYCOL 3350 17 G PO PACK: 17.0000 g | PACK | Freq: Every day | ORAL | Status: DC | PRN

## 2017-10-23 MED ORDER — FLEET ENEMA 7-19 GM/118ML RE ENEM: 1.0000 | ENEMA | Freq: Once | RECTAL | Status: DC | PRN

## 2017-10-23 MED ORDER — KCL IN DEXTROSE-NACL 20-5-0.45 MEQ/L-%-% IV SOLN
INTRAVENOUS | Status: DC
  Administered 2017-10-23 (×2): via INTRAVENOUS
  Administered 2017-10-23: 125 mL/h via INTRAVENOUS
  Filled 2017-10-23: qty 1000

## 2017-10-23 MED ORDER — DIPHENHYDRAMINE HCL 12.5 MG/5ML PO ELIX: 12.5000 mg | ORAL_SOLUTION | ORAL | Status: DC | PRN

## 2017-10-23 MED ORDER — LIDOCAINE HCL (CARDIAC) 20 MG/ML IV SOLN
INTRAVENOUS | Status: DC | PRN
  Administered 2017-10-23: 50 mg via INTRATRACHEAL

## 2017-10-23 MED ORDER — FENTANYL CITRATE (PF) 100 MCG/2ML IJ SOLN
INTRAMUSCULAR | Status: AC
  Administered 2017-10-23: 50 ug via INTRAVENOUS
  Filled 2017-10-23: qty 2

## 2017-10-23 MED ORDER — METHOCARBAMOL 500 MG PO TABS
500.0000 mg | ORAL_TABLET | Freq: Four times a day (QID) | ORAL | Status: DC | PRN
  Administered 2017-10-23 – 2017-10-24 (×2): 500 mg via ORAL
  Filled 2017-10-23: qty 1

## 2017-10-23 MED ORDER — AMLODIPINE-VALSARTAN-HCTZ 10-160-12.5 MG PO TABS: 1.0000 | ORAL_TABLET | Freq: Every morning | ORAL | Status: DC

## 2017-10-23 MED ORDER — BUPIVACAINE-EPINEPHRINE 0.25% -1:200000 IJ SOLN
INTRAMUSCULAR | Status: AC
  Filled 2017-10-23: qty 1

## 2017-10-23 MED ORDER — METFORMIN HCL ER 500 MG PO TB24
1000.0000 mg | ORAL_TABLET | Freq: Every day | ORAL | Status: DC
  Administered 2017-10-24 – 2017-10-25 (×2): 1000 mg via ORAL
  Filled 2017-10-23 (×2): qty 2

## 2017-10-23 MED ORDER — CHLORHEXIDINE GLUCONATE 4 % EX LIQD: 60.0000 mL | Freq: Once | CUTANEOUS | Status: DC

## 2017-10-23 MED ORDER — ALUM & MAG HYDROXIDE-SIMETH 200-200-20 MG/5ML PO SUSP: 30.0000 mL | ORAL | Status: DC | PRN

## 2017-10-23 MED ORDER — PIOGLITAZONE HCL 30 MG PO TABS
30.0000 mg | ORAL_TABLET | Freq: Every day | ORAL | Status: DC
  Administered 2017-10-24 – 2017-10-25 (×2): 30 mg via ORAL
  Filled 2017-10-23 (×2): qty 1

## 2017-10-23 MED ORDER — ONDANSETRON HCL 4 MG PO TABS: 4.0000 mg | ORAL_TABLET | Freq: Four times a day (QID) | ORAL | Status: DC | PRN

## 2017-10-23 MED ORDER — COLESEVELAM HCL 625 MG PO TABS
1875.0000 mg | ORAL_TABLET | Freq: Every day | ORAL | Status: DC
  Administered 2017-10-24 – 2017-10-25 (×2): 1875 mg via ORAL
  Filled 2017-10-23 (×2): qty 3

## 2017-10-23 MED ORDER — MENTHOL 3 MG MT LOZG: 1.0000 | LOZENGE | OROMUCOSAL | Status: DC | PRN

## 2017-10-23 MED ORDER — BUPIVACAINE-EPINEPHRINE 0.25% -1:200000 IJ SOLN
INTRAMUSCULAR | Status: DC | PRN
  Administered 2017-10-23: 50 mL

## 2017-10-23 MED ORDER — ACETAMINOPHEN 500 MG PO TABS
1000.0000 mg | ORAL_TABLET | Freq: Four times a day (QID) | ORAL | Status: AC
  Administered 2017-10-23 – 2017-10-24 (×3): 1000 mg via ORAL
  Filled 2017-10-23 (×3): qty 2

## 2017-10-23 MED ORDER — PIOGLITAZONE HCL-METFORMIN ER 30-1000 MG PO TB24: 1.0000 | ORAL_TABLET | Freq: Every morning | ORAL | Status: DC

## 2017-10-23 MED ORDER — VITAMIN C 500 MG PO TABS: 1000.0000 mg | ORAL_TABLET | ORAL | Status: DC

## 2017-10-23 MED ORDER — HYDROCHLOROTHIAZIDE 12.5 MG PO CAPS
12.5000 mg | ORAL_CAPSULE | Freq: Every day | ORAL | Status: DC
  Administered 2017-10-24 – 2017-10-25 (×2): 12.5 mg via ORAL
  Filled 2017-10-23 (×2): qty 1

## 2017-10-23 MED ORDER — ROCURONIUM BROMIDE 10 MG/ML (PF) SYRINGE
PREFILLED_SYRINGE | INTRAVENOUS | Status: AC
  Filled 2017-10-23: qty 15

## 2017-10-23 MED ORDER — ROPINIROLE HCL 1 MG PO TABS
1.0000 mg | ORAL_TABLET | Freq: Two times a day (BID) | ORAL | Status: DC
  Administered 2017-10-23 – 2017-10-25 (×5): 1 mg via ORAL
  Filled 2017-10-23 (×6): qty 1

## 2017-10-23 MED ORDER — METOCLOPRAMIDE HCL 5 MG/ML IJ SOLN: 5.0000 mg | Freq: Three times a day (TID) | INTRAMUSCULAR | Status: DC | PRN

## 2017-10-23 MED ORDER — BUPIVACAINE HCL (PF) 0.5 % IJ SOLN
INTRAMUSCULAR | Status: AC
  Filled 2017-10-23: qty 10

## 2017-10-23 MED ORDER — KCL IN DEXTROSE-NACL 20-5-0.45 MEQ/L-%-% IV SOLN
INTRAVENOUS | Status: AC
  Administered 2017-10-23: 19:00:00
  Filled 2017-10-23: qty 1000

## 2017-10-23 MED ORDER — OXYCODONE-ACETAMINOPHEN 5-325 MG PO TABS: 1.0000 | ORAL_TABLET | ORAL | 0 refills | Status: DC | PRN

## 2017-10-23 MED ORDER — ONDANSETRON HCL 4 MG/2ML IJ SOLN
INTRAMUSCULAR | Status: AC
  Filled 2017-10-23: qty 4

## 2017-10-23 MED ORDER — OMEGA-3-ACID ETHYL ESTERS 1 G PO CAPS
2.0000 g | ORAL_CAPSULE | Freq: Two times a day (BID) | ORAL | Status: DC
  Administered 2017-10-23 – 2017-10-25 (×4): 2 g via ORAL
  Filled 2017-10-23 (×4): qty 2

## 2017-10-23 MED ORDER — LACTATED RINGERS IV SOLN
INTRAVENOUS | Status: DC
  Administered 2017-10-23 (×2): via INTRAVENOUS

## 2017-10-23 MED ORDER — AMLODIPINE BESYLATE 10 MG PO TABS
10.0000 mg | ORAL_TABLET | Freq: Every day | ORAL | Status: DC
  Administered 2017-10-24 – 2017-10-25 (×2): 10 mg via ORAL
  Filled 2017-10-23 (×2): qty 1

## 2017-10-23 MED ORDER — EZETIMIBE-SIMVASTATIN 10-80 MG PO TABS
1.0000 | ORAL_TABLET | Freq: Every morning | ORAL | Status: DC
  Administered 2017-10-24 – 2017-10-25 (×2): 1 via ORAL
  Filled 2017-10-23 (×2): qty 1

## 2017-10-23 MED ORDER — ACETAMINOPHEN 325 MG PO TABS: 325.0000 mg | ORAL_TABLET | Freq: Four times a day (QID) | ORAL | Status: DC | PRN

## 2017-10-23 MED ORDER — ASPIRIN EC 325 MG PO TBEC
325.0000 mg | DELAYED_RELEASE_TABLET | Freq: Every day | ORAL | Status: DC
  Administered 2017-10-24 – 2017-10-25 (×2): 325 mg via ORAL
  Filled 2017-10-23 (×2): qty 1

## 2017-10-23 MED ORDER — LIDOCAINE HCL (CARDIAC) 20 MG/ML IV SOLN
INTRAVENOUS | Status: AC
  Filled 2017-10-23: qty 10

## 2017-10-23 MED ORDER — PROPOFOL 1000 MG/100ML IV EMUL
INTRAVENOUS | Status: AC
  Filled 2017-10-23: qty 100

## 2017-10-23 MED ORDER — IRBESARTAN 150 MG PO TABS
150.0000 mg | ORAL_TABLET | Freq: Every day | ORAL | Status: DC
  Administered 2017-10-24 – 2017-10-25 (×2): 150 mg via ORAL
  Filled 2017-10-23 (×2): qty 1

## 2017-10-23 MED ORDER — PROPOFOL 500 MG/50ML IV EMUL
INTRAVENOUS | Status: DC | PRN
  Administered 2017-10-23: 20 ug/kg/min via INTRAVENOUS
  Administered 2017-10-23: 15 ug/kg/min via INTRAVENOUS
  Administered 2017-10-23: 50 ug/kg/min via INTRAVENOUS

## 2017-10-23 MED ORDER — FENTANYL CITRATE (PF) 250 MCG/5ML IJ SOLN
INTRAMUSCULAR | Status: DC | PRN
  Administered 2017-10-23 (×2): 50 ug via INTRAVENOUS

## 2017-10-23 MED ORDER — SUGAMMADEX SODIUM 200 MG/2ML IV SOLN
INTRAVENOUS | Status: AC
  Filled 2017-10-23: qty 2

## 2017-10-23 MED ORDER — ASPIRIN EC 325 MG PO TBEC: 325.0000 mg | DELAYED_RELEASE_TABLET | Freq: Two times a day (BID) | ORAL | 0 refills | Status: DC

## 2017-10-23 MED ORDER — PHENOL 1.4 % MT LIQD: 1.0000 | OROMUCOSAL | Status: DC | PRN

## 2017-10-23 MED ORDER — METHOCARBAMOL 1000 MG/10ML IJ SOLN
500.0000 mg | Freq: Four times a day (QID) | INTRAMUSCULAR | Status: DC | PRN
  Filled 2017-10-23: qty 5

## 2017-10-23 MED ORDER — TAMSULOSIN HCL 0.4 MG PO CAPS
0.4000 mg | ORAL_CAPSULE | Freq: Every day | ORAL | Status: DC
  Administered 2017-10-23 – 2017-10-24 (×2): 0.4 mg via ORAL
  Filled 2017-10-23 (×2): qty 1

## 2017-10-23 MED ORDER — MIDAZOLAM HCL 2 MG/2ML IJ SOLN
INTRAMUSCULAR | Status: AC
  Filled 2017-10-23: qty 2

## 2017-10-23 MED ORDER — OXYCODONE HCL 5 MG PO TABS
ORAL_TABLET | ORAL | Status: AC
  Administered 2017-10-23: 5 mg via ORAL
  Filled 2017-10-23: qty 1

## 2017-10-23 MED ORDER — HYDROMORPHONE HCL 1 MG/ML IJ SOLN
0.5000 mg | INTRAMUSCULAR | Status: DC | PRN
  Administered 2017-10-23: 1 mg via INTRAVENOUS
  Filled 2017-10-23: qty 1

## 2017-10-23 MED ORDER — TRANEXAMIC ACID 1000 MG/10ML IV SOLN
1000.0000 mg | Freq: Once | INTRAVENOUS | Status: DC
  Filled 2017-10-23: qty 10

## 2017-10-23 MED ORDER — FENTANYL CITRATE (PF) 100 MCG/2ML IJ SOLN
25.0000 ug | INTRAMUSCULAR | Status: DC | PRN
  Administered 2017-10-23 (×2): 50 ug via INTRAVENOUS

## 2017-10-23 MED ORDER — INSULIN GLARGINE 100 UNIT/ML ~~LOC~~ SOLN
40.0000 [IU] | Freq: Every day | SUBCUTANEOUS | Status: DC
  Administered 2017-10-23 – 2017-10-24 (×2): 40 [IU] via SUBCUTANEOUS
  Filled 2017-10-23 (×2): qty 0.4

## 2017-10-23 MED ORDER — LACTATED RINGERS IV SOLN: INTRAVENOUS | Status: DC

## 2017-10-23 MED ORDER — 0.9 % SODIUM CHLORIDE (POUR BTL) OPTIME
TOPICAL | Status: DC | PRN
  Administered 2017-10-23: 1000 mL

## 2017-10-23 MED ORDER — METOCLOPRAMIDE HCL 5 MG PO TABS: 5.0000 mg | ORAL_TABLET | Freq: Three times a day (TID) | ORAL | Status: DC | PRN

## 2017-10-23 MED ORDER — MIDAZOLAM HCL 5 MG/5ML IJ SOLN
INTRAMUSCULAR | Status: DC | PRN
  Administered 2017-10-23: 2 mg via INTRAVENOUS

## 2017-10-23 MED ORDER — DONEPEZIL HCL 10 MG PO TABS
10.0000 mg | ORAL_TABLET | Freq: Every morning | ORAL | Status: DC
  Administered 2017-10-24 – 2017-10-25 (×2): 10 mg via ORAL
  Filled 2017-10-23 (×2): qty 1

## 2017-10-23 MED ORDER — PHENYLEPHRINE HCL 10 MG/ML IJ SOLN
INTRAVENOUS | Status: DC | PRN
  Administered 2017-10-23: 25 ug/min via INTRAVENOUS

## 2017-10-23 MED ORDER — BISACODYL 5 MG PO TBEC: 5.0000 mg | DELAYED_RELEASE_TABLET | Freq: Every day | ORAL | Status: DC | PRN

## 2017-10-23 MED ORDER — OXYCODONE HCL 5 MG PO TABS
5.0000 mg | ORAL_TABLET | ORAL | Status: DC | PRN
  Administered 2017-10-23: 5 mg via ORAL
  Administered 2017-10-23: 10 mg via ORAL
  Administered 2017-10-24 (×2): 5 mg via ORAL
  Filled 2017-10-23 (×2): qty 1
  Filled 2017-10-23: qty 2

## 2017-10-23 MED ORDER — PROPOFOL 10 MG/ML IV BOLUS
INTRAVENOUS | Status: AC
  Filled 2017-10-23: qty 20

## 2017-10-23 MED ORDER — DEXAMETHASONE SODIUM PHOSPHATE 10 MG/ML IJ SOLN
INTRAMUSCULAR | Status: AC
Start: 2017-10-23 — End: 2017-10-23
  Filled 2017-10-23: qty 2

## 2017-10-23 MED ORDER — METHOCARBAMOL 500 MG PO TABS
ORAL_TABLET | ORAL | Status: AC
  Administered 2017-10-23: 500 mg via ORAL
  Filled 2017-10-23: qty 1

## 2017-10-23 MED ORDER — CEFAZOLIN SODIUM-DEXTROSE 2-4 GM/100ML-% IV SOLN
2.0000 g | INTRAVENOUS | Status: AC
  Administered 2017-10-23: 2 g via INTRAVENOUS
  Filled 2017-10-23: qty 100

## 2017-10-23 MED ORDER — ONDANSETRON HCL 4 MG/2ML IJ SOLN: 4.0000 mg | Freq: Once | INTRAMUSCULAR | Status: DC | PRN

## 2017-10-23 MED ORDER — FENTANYL CITRATE (PF) 250 MCG/5ML IJ SOLN
INTRAMUSCULAR | Status: AC
  Filled 2017-10-23: qty 5

## 2017-10-23 MED ORDER — GABAPENTIN 300 MG PO CAPS
300.0000 mg | ORAL_CAPSULE | Freq: Three times a day (TID) | ORAL | Status: DC
  Administered 2017-10-23 – 2017-10-25 (×5): 300 mg via ORAL
  Filled 2017-10-23 (×5): qty 1

## 2017-10-23 MED ORDER — DEXAMETHASONE SODIUM PHOSPHATE 10 MG/ML IJ SOLN
10.0000 mg | Freq: Once | INTRAMUSCULAR | Status: AC
  Administered 2017-10-24: 10 mg via INTRAVENOUS
  Filled 2017-10-23: qty 1

## 2017-10-23 MED ORDER — SODIUM CHLORIDE 0.9 % IJ SOLN
INTRAMUSCULAR | Status: DC | PRN
  Administered 2017-10-23: 50 mL

## 2017-10-23 MED ORDER — TIZANIDINE HCL 2 MG PO TABS: 2.0000 mg | ORAL_TABLET | Freq: Four times a day (QID) | ORAL | 0 refills | Status: DC | PRN

## 2017-10-23 MED ORDER — ONDANSETRON HCL 4 MG/2ML IJ SOLN: 4.0000 mg | Freq: Four times a day (QID) | INTRAMUSCULAR | Status: DC | PRN

## 2017-10-23 MED ORDER — PROPOFOL 10 MG/ML IV BOLUS
INTRAVENOUS | Status: DC | PRN
  Administered 2017-10-23 (×4): 10 mg via INTRAVENOUS

## 2017-10-23 SURGICAL SUPPLY — 46 items
BAG DECANTER FOR FLEXI CONT (MISCELLANEOUS) ×5 IMPLANT
BLADE SAW SGTL 18X1.27X75 (BLADE) ×2 IMPLANT
BLADE SAW SGTL 18X1.27X75MM (BLADE) ×1
CAPT HIP TOTAL 2 ×2 IMPLANT
COVER PERINEAL POST (MISCELLANEOUS) ×3 IMPLANT
COVER SURGICAL LIGHT HANDLE (MISCELLANEOUS) ×3 IMPLANT
DRAPE C-ARM 42X72 X-RAY (DRAPES) ×3 IMPLANT
DRAPE STERI IOBAN 125X83 (DRAPES) ×3 IMPLANT
DRAPE U-SHAPE 47X51 STRL (DRAPES) ×6 IMPLANT
DRSG AQUACEL AG ADV 3.5X10 (GAUZE/BANDAGES/DRESSINGS) ×3 IMPLANT
DURAPREP 26ML APPLICATOR (WOUND CARE) ×3 IMPLANT
ELECT BLADE 4.0 EZ CLEAN MEGAD (MISCELLANEOUS) ×3
ELECT REM PT RETURN 9FT ADLT (ELECTROSURGICAL) ×3
ELECTRODE BLDE 4.0 EZ CLN MEGD (MISCELLANEOUS) ×1 IMPLANT
ELECTRODE REM PT RTRN 9FT ADLT (ELECTROSURGICAL) ×1 IMPLANT
FACESHIELD WRAPAROUND (MASK) ×6 IMPLANT
FACESHIELD WRAPAROUND OR TEAM (MASK) ×2 IMPLANT
GLOVE BIO SURGEON STRL SZ7.5 (GLOVE) ×3 IMPLANT
GLOVE BIO SURGEON STRL SZ8.5 (GLOVE) ×3 IMPLANT
GLOVE BIOGEL PI IND STRL 8 (GLOVE) ×1 IMPLANT
GLOVE BIOGEL PI IND STRL 9 (GLOVE) ×1 IMPLANT
GLOVE BIOGEL PI INDICATOR 8 (GLOVE) ×2
GLOVE BIOGEL PI INDICATOR 9 (GLOVE) ×2
GOWN STRL REUS W/ TWL LRG LVL3 (GOWN DISPOSABLE) ×1 IMPLANT
GOWN STRL REUS W/ TWL XL LVL3 (GOWN DISPOSABLE) ×2 IMPLANT
GOWN STRL REUS W/TWL LRG LVL3 (GOWN DISPOSABLE) ×3
GOWN STRL REUS W/TWL XL LVL3 (GOWN DISPOSABLE) ×6
KIT BASIN OR (CUSTOM PROCEDURE TRAY) ×3 IMPLANT
KIT ROOM TURNOVER OR (KITS) ×3 IMPLANT
MANIFOLD NEPTUNE II (INSTRUMENTS) ×3 IMPLANT
NEEDLE HYPO 22GX1.5 SAFETY (NEEDLE) ×6 IMPLANT
NS IRRIG 1000ML POUR BTL (IV SOLUTION) ×3 IMPLANT
PACK TOTAL JOINT (CUSTOM PROCEDURE TRAY) ×3 IMPLANT
PAD ARMBOARD 7.5X6 YLW CONV (MISCELLANEOUS) ×6 IMPLANT
SUT ETHIBOND NAB CT1 #1 30IN (SUTURE) ×3 IMPLANT
SUT VIC AB 0 CT1 27 (SUTURE) ×3
SUT VIC AB 0 CT1 27XBRD ANBCTR (SUTURE) IMPLANT
SUT VIC AB 1 CTX 36 (SUTURE) ×3
SUT VIC AB 1 CTX36XBRD ANBCTR (SUTURE) ×1 IMPLANT
SUT VIC AB 2-0 CT1 27 (SUTURE) ×3
SUT VIC AB 2-0 CT1 TAPERPNT 27 (SUTURE) ×1 IMPLANT
SUT VIC AB 3-0 CT1 27 (SUTURE) ×3
SUT VIC AB 3-0 CT1 TAPERPNT 27 (SUTURE) ×1 IMPLANT
SYR CONTROL 10ML LL (SYRINGE) ×6 IMPLANT
TOWEL OR 17X26 10 PK STRL BLUE (TOWEL DISPOSABLE) ×3 IMPLANT
TRAY CATH 16FR W/PLASTIC CATH (SET/KITS/TRAYS/PACK) ×2 IMPLANT

## 2017-10-23 NOTE — Anesthesia Procedure Notes (Signed)
Procedure Name: MAC Date/Time: 10/23/2017 12:29 PM Performed by: Mariea Clonts, CRNA Pre-anesthesia Checklist: Patient identified, Emergency Drugs available, Suction available, Timeout performed and Patient being monitored

## 2017-10-23 NOTE — Discharge Instructions (Signed)

## 2017-10-23 NOTE — Transfer of Care (Signed)
Immediate Anesthesia Transfer of Care Note  Patient: Cody Phelps  Procedure(s) Performed: TOTAL HIP ARTHROPLASTY ANTERIOR APPROACH (Right Hip)  Patient Location: PACU  Anesthesia Type:MAC and Spinal  Level of Consciousness: awake, alert  and oriented  Airway & Oxygen Therapy: Patient Spontanous Breathing and Patient connected to nasal cannula oxygen  Post-op Assessment: Report given to RN and Post -op Vital signs reviewed and stable  Post vital signs: Reviewed and stable  Last Vitals:  Vitals Value Taken Time  BP 141/59 10/23/2017  2:49 PM  Temp    Pulse 72 10/23/2017  2:50 PM  Resp 17 10/23/2017  2:50 PM  SpO2 100 % 10/23/2017  2:50 PM  Vitals shown include unvalidated device data.  Last Pain:  Vitals:   10/23/17 1029  TempSrc: Oral         Complications: No apparent anesthesia complications

## 2017-10-23 NOTE — Op Note (Signed)
OPERATIVE REPORT    DATE OF PROCEDURE:  10/23/2017       PREOPERATIVE DIAGNOSIS:  RIGHT HIP OSTEOARTHRITIS                                                          POSTOPERATIVE DIAGNOSIS:  RIGHT HIP OSTEOARTHRITIS                                                           PROCEDURE: Anterior R total hip arthroplasty using a 52 mm DePuy Pinnacle  Cup, [employer], 0-degree polyethylene liner, a +1 36 mm ceramic head, a 4 Hi Depuy Triloc stem   SURGEON: Kerin Salen    ASSISTANT:   Kerry Hough. [employer]  (present throughout entire procedure and necessary for timely completion of the procedure)   ANESTHESIA: Spinal BLOOD LOSS: 300cc FLUID REPLACEMENT: 1500 crystalloid Antibiotic: 2gm ancef Tranexamic Acid: 1gm IV, 2gm Topical COMPLICATIONS: none    INDICATIONS FOR PROCEDURE: A 77 y.o. year-old With  RIGHT HIP OSTEOARTHRITIS   for 4 years, x-rays show bone-on-bone arthritic changes, and osteophytes. Despite conservative measures with observation, anti-inflammatory medicine, narcotics, use of a cane, has severe unremitting pain and can ambulate only a few blocks before resting. Patient desires elective R total hip arthroplasty to decrease pain and increase function. The risks, benefits, and alternatives were discussed at length including but not limited to the risks of infection, bleeding, nerve injury, stiffness, blood clots, the need for revision surgery, cardiopulmonary complications, among others, and they were willing to proceed. Questions answered     PROCEDURE IN DETAIL: The patient was identified by armband,  received preoperative IV antibiotics in the holding area at [facility], taken to the operating room , appropriate anesthetic monitors  were attached and  anesthesia was induced with the patienton the gurney. The HANA boots were applied to the feet and he was then transferred to the HANA table with a peroneal post and support underneath the non-operative  le, which was locked in 5 lb traction. Theoperative lower extremity was then prepped and draped in the usual sterile fashion from just above the iliac crest to the knee. And a timeout procedure was performed. We then made a 12 cm incision along the interval at the leading edge of the tensor fascia lata of starting at 2 cm lateral to and 2 cm distal to the ASIS. Small bleeders in the skin and subcutaneous tissue identified and cauterized we dissected down to the fascia and made an incision in the fascia allowing [place] to elevate the fascia of the tensor muscle and exploited the interval between the rectus and the tensor fascia lata. A Hohmann retractor was then placed along the superior neck of the femur and a Cobra retractor along the inferior neck of the femur we teed the capsule starting out at the superior anterior aspect of the acetabulum going distally and made the T along the neck both leaflets of the T were tagged with #2 Ethibond suture. Cobra retractors were then placed along the inferior and superior neck allowing [place] to perform a standard neck cut  and removed the femoral head with a power corkscrew. We then placed a right angle Hohmann retractor along the anterior aspect of the acetabulum a spiked Cobra in the cotyloid notch and posteriorly a Muelller retractor. We then sequentially reamed up to a 51 mm basket reamer obtaining good coverage in all quadrants, verified by C-arm imaging. Under C-arm control with and hammered into place a 52 mm Pinnacle cup in 45 of abduction and 15 of anteversion. The cup seated nicely and required no supplemental screws. We then placed a central hole Eliminator and a 0 polyethylene liner. The foot was then externally rotated to 110, the HANA elevator was placed around the flare of the greater trochanter and the limb was extended and abducted delivering the proximal femur up into the wound. A medium Hohmann retractor was placed over the greater trochanter and a Mueller  retractor along the posterior femoral neck completing the exposure. We then performed releases superiorly and and inferiorly of the capsule going back to the pirformis fossa superiorly and to the lesser trochanter inferiorly. We then entered the proximal femur with the box cutting offset chisel followed by, a canal sounder, the chili pepper and broaching up to a 4 broach. This seated nicely and we reamed the calcar. A trial reduction was performed with a 1 mm 36 mm head.The limb lengths were excellent the hip was stable in 90 of external rotation. At this point the trial components removed and we hammered into place a # 4 Hi  Offset Tri-Lock stem with Gryption coating. A + 1 36 mm ceramic ball was then hammered into place the hip was reduced and final C-arm images obtained. The wound was thoroughly irrigated with normal saline solution. We repaired the ant capsule and the tensor fascia lot a with running 0 vicryl suture. the subcutaneous tissue was closed with 2-0 and 3-0 Vicryl suture followed by an Aquacil dressing. At this point the patient was awaken and transferred to hospital gurney without difficulty. The subcutaneous tissue with 0 and 2-0 undyed Vicryl suture and the skin with running  3-0 vicryl subcuticular suture. Aquacil dressing was applied. The patient was then unclamped, rolled supine, awaken extubated and taken to recovery room without difficulty in stable condition.   Kerin Salen 10/23/2017, 1:38 PM

## 2017-10-23 NOTE — Anesthesia Preprocedure Evaluation (Addendum)
Anesthesia Evaluation  Patient identified by MRN, date of birth, ID band Patient awake    Reviewed: Allergy & Precautions, NPO status , Patient's Chart, lab work & pertinent test results  Airway Mallampati: II  TM Distance: >3 FB Neck ROM: Full    Dental no notable dental hx.    Pulmonary sleep apnea and Continuous Positive Airway Pressure Ventilation , former smoker,    Pulmonary exam normal breath sounds clear to auscultation       Cardiovascular hypertension, Pt. on medications + Valvular Problems/Murmurs AS  Rhythm:Regular Rate:Normal + Systolic murmurs ECG: NSR, rate 83, LVH  Echo 08/24/17 [facility] cardiovascular): 1.  LV cavity normal in size.  Moderate concentric LVH.  Normal global wall motion.  Grade IAI diastolic dysfunction.  Calculated EF 68%. 2.  LA cavity mildly dilated. 3.  Mild aortic regurgitation.  Moderate calcification of the aortic valve annulus.  Moderate aortic valve leaflet calcification.  Moderately restricted aortic valve leaflets.  Moderate aortic valve stenosis with peak gradient 49, mean gradient 27 mmHg.  Calculated valve area is 1.29 cm. 4.  Mild mitral regurgitation.  Moderate calcification of the mitral valve annulus.  Mild calcification of the mitral leaflets.  Mildly restricted mitral valve leaflets, mild mitral stenosis with mean gradient 5.6 mmHg.  Calculated valve area 2.5 cm. 5.  Trace tricuspid regurgitation. 6.  No significant change in severity of aortic stenosis from prior echo 01/01/17  Cardiologist is Kela Millin, MD.  Last office visit 08/03/17.   Neuro/Psych negative neurological ROS  negative psych ROS   GI/Hepatic Neg liver ROS, GERD  Medicated and Controlled,  Endo/Other  diabetes, Insulin Dependent  Renal/GU negative Renal ROS     Musculoskeletal  (+) Arthritis , Osteoarthritis,  Scoliosis RLS (restless legs syndrome)   Abdominal (+) + obese,   Peds   Hematology  (+) anemia , HLD   Anesthesia Other Findings RIGHT HIP OSTEOARTHRITIS  Reproductive/Obstetrics                           Anesthesia Physical Anesthesia Plan  ASA: III  Anesthesia Plan: Spinal   Post-op Pain Management:    Induction: Intravenous  PONV Risk Score and Plan: 1 and Ondansetron and Treatment may vary due to age or medical condition  Airway Management Planned: Natural Airway  Additional Equipment:   Intra-op Plan:   Post-operative Plan:   Informed Consent: I have reviewed the patients History and Physical, chart, labs and discussed the procedure including the risks, benefits and alternatives for the proposed anesthesia with the patient or authorized representative who has indicated his/her understanding and acceptance.   Dental advisory given  Plan Discussed with: CRNA  Anesthesia Plan Comments:         Anesthesia Quick Evaluation

## 2017-10-23 NOTE — Interval H&P Note (Signed)
History and Physical Interval Note:  10/23/2017 11:19 AM  Cody Phelps  has presented today for surgery, with the diagnosis of RIGHT HIP OSTEOARTHRITIS  The various methods of treatment have been discussed with the patient and family. After consideration of risks, benefits and other options for treatment, the patient has consented to  Procedure(s): TOTAL HIP ARTHROPLASTY ANTERIOR APPROACH (Right) as a surgical intervention .  The patient's history has been reviewed, patient examined, no change in status, stable for surgery.  I have reviewed the patient's chart and labs.  Questions were answered to the patient's satisfaction.     Kerin Salen

## 2017-10-23 NOTE — Anesthesia Postprocedure Evaluation (Signed)
Anesthesia Post Note  Patient: Cody Phelps  Procedure(s) Performed: TOTAL HIP ARTHROPLASTY ANTERIOR APPROACH (Right Hip)     Patient location during evaluation: PACU Anesthesia Type: Spinal Level of consciousness: oriented and awake and alert Pain management: pain level controlled Vital Signs Assessment: post-procedure vital signs reviewed and stable Respiratory status: spontaneous breathing, respiratory function stable and patient connected to nasal cannula oxygen Cardiovascular status: blood pressure returned to baseline and stable Postop Assessment: no headache, no backache and no apparent nausea or vomiting Anesthetic complications: no    Last Vitals:  Vitals:   10/23/17 1505 10/23/17 1520  BP: (!) 142/54 135/69  Pulse: 63 75  Resp: 15 18  Temp:    SpO2: 100% 100%    Last Pain:  Vitals:   10/23/17 1029  TempSrc: Oral                 Ednamae Schiano DAVID

## 2017-10-24 ENCOUNTER — Other Ambulatory Visit: Payer: Self-pay

## 2017-10-24 ENCOUNTER — Encounter (HOSPITAL_COMMUNITY): Payer: Self-pay | Admitting: Orthopedic Surgery

## 2017-10-24 LAB — CBC
HCT: 25.7 % — ABNORMAL LOW (ref 39.0–52.0)
Hemoglobin: 8.6 g/dL — ABNORMAL LOW (ref 13.0–17.0)
MCH: 29.8 pg (ref 26.0–34.0)
MCHC: 33.5 g/dL (ref 30.0–36.0)
MCV: 88.9 fL (ref 78.0–100.0)
Platelets: 203 10*3/uL (ref 150–400)
RBC: 2.89 MIL/uL — ABNORMAL LOW (ref 4.22–5.81)
RDW: 13.5 % (ref 11.5–15.5)
WBC: 6 10*3/uL (ref 4.0–10.5)

## 2017-10-24 LAB — BASIC METABOLIC PANEL
Anion gap: 10 (ref 5–15)
BUN: 17 mg/dL (ref 6–20)
CO2: 24 mmol/L (ref 22–32)
Calcium: 8.5 mg/dL — ABNORMAL LOW (ref 8.9–10.3)
Chloride: 102 mmol/L (ref 101–111)
Creatinine, Ser: 0.94 mg/dL (ref 0.61–1.24)
GFR calc Af Amer: >60 mL/min (ref >60)
GFR calc non Af Amer: >60 mL/min (ref >60)
Glucose, Bld: 148 mg/dL — ABNORMAL HIGH (ref 65–99)
Potassium: 4.3 mmol/L (ref 3.5–5.1)
Sodium: 136 mmol/L (ref 135–145)

## 2017-10-24 LAB — GLUCOSE, CAPILLARY
Glucose-Capillary: 138 mg/dL — ABNORMAL HIGH (ref 65–99)
Glucose-Capillary: 135 mg/dL — ABNORMAL HIGH (ref 65–99)
Glucose-Capillary: 170 mg/dL — ABNORMAL HIGH (ref 65–99)
Glucose-Capillary: 200 mg/dL — ABNORMAL HIGH (ref 65–99)
Glucose-Capillary: 257 mg/dL — ABNORMAL HIGH (ref 65–99)

## 2017-10-24 NOTE — Evaluation (Signed)
Physical Therapy Evaluation Patient Details Name: Cody Phelps MRN: [id number] DOB: 1941/07/07 Today's Date: 10/24/2017   History of Present Illness  Pt presents for R hip THA, direct anterior approach. PMH: HTN, DM2, sleep apnea, prostate cancer, lumbar surgery  Clinical Impression  Pt is s/p THA resulting in the deficits listed below (see PT Problem List). Pt mobilizing with min A to get out of bed and ambulate 100' with RW. Pain level 6/10.  Pt will benefit from skilled PT to increase their independence and safety with mobility to allow discharge to the venue listed below.      Follow Up Recommendations Follow surgeon's recommendation for DC plan and follow-up therapies    Equipment Recommendations  None recommended by PT    Recommendations for Other Services       Precautions / Restrictions Precautions Precautions: None Restrictions Weight Bearing Restrictions: Yes RLE Weight Bearing: Weight bearing as tolerated Other Position/Activity Restrictions: keep an eye on hgb, possibility of needing transfusion      Mobility  Bed Mobility Overal bed mobility: Needs Assistance Bed Mobility: Supine to Sit     Supine to sit: Min assist     General bed mobility comments: rolled to L with use of rail, Min A to LE's to get to EOB, pt able to manage trunk  Transfers Overall transfer level: Needs assistance Equipment used: Rolling walker (2 wheeled) Transfers: Sit to/from Stand Sit to Stand: Min guard         General transfer comment: vc's for hand placement, min-guard for safety  Ambulation/Gait Ambulation/Gait assistance: Min assist Ambulation Distance (Feet): 100 Feet Assistive device: Rolling walker (2 wheeled) Gait Pattern/deviations: Step-to pattern;Decreased weight shift to right;Decreased stance time - right Gait velocity: decreased Gait velocity interpretation: Below normal speed for age/gender General Gait Details: vc's for erect posture and gait  sequencing  Stairs            Wheelchair Mobility    Modified Rankin (Stroke Patients Only)       Balance Overall balance assessment: Mild deficits observed, not formally tested                                           Pertinent Vitals/Pain Pain Assessment: 0-10 Pain Score: 6  Pain Location: R hip Pain Descriptors / Indicators: Aching;Sore Pain Intervention(s): Limited activity within patient's tolerance;Monitored during session;Premedicated before session    [place] expects to be discharged to:: Private residence Living Arrangements: Spouse/significant other Available Help at Discharge: Family;Available 24 hours/day Type of Home: House Home Access: Stairs to enter Entrance Stairs-Rails: Right Entrance Stairs-Number of Steps: 3 Home Layout: One level Home Equipment: Walker - 2 wheels;Cane - single point;Bedside commode;Shower seat - built in Additional Comments: pt has equipment from back surgery in 2015    Prior Function Level of Independence: Independent with assistive device(s)         Comments: needed cane at times for pain     Hand Dominance        Extremity/Trunk Assessment   Upper Extremity Assessment Upper Extremity Assessment: Defer to OT evaluation    Lower Extremity Assessment Lower Extremity Assessment: RLE deficits/detail RLE Deficits / Details: hip flex 2-/5, knee ext 3/5, knee flex 3/5 (h/o surgery on R ankle) RLE Sensation: WNL RLE Coordination: WNL    Cervical / Trunk Assessment Cervical / Trunk Assessment: Kyphotic  Communication  Communication: No difficulties  Cognition Arousal/Alertness: Awake/alert Behavior During Therapy: WFL for tasks assessed/performed Overall Cognitive Status: Within Functional Limits for tasks assessed                                        General Comments      Exercises Total Joint Exercises Ankle Circles/Pumps: AROM;Both;10  reps;Supine Quad Sets: AROM;Both;10 reps;Seated Gluteal Sets: AROM;Both;10 reps;Seated Heel Slides: AAROM;Right;10 reps;Supine Hip ABduction/ADduction: AAROM;Right;10 reps;Supine Straight Leg Raises: AAROM;Right;5 reps;Supine   Assessment/Plan    PT Assessment Patient needs continued PT services  PT Problem List Decreased strength;Decreased activity tolerance;Decreased balance;Decreased mobility;Pain       PT Treatment Interventions DME instruction;Gait training;Stair training;Functional mobility training;Therapeutic activities;Therapeutic exercise;Balance training;Neuromuscular re-education;Patient/family education    PT Goals (Current goals can be found in the Care Plan section)  Acute Rehab PT Goals Patient Stated Goal: return home PT Goal Formulation: With patient Time For Goal Achievement: 11/07/17 Potential to Achieve Goals: Good    Frequency 7X/week   Barriers to discharge        Co-evaluation               AM-PAC PT "6 Clicks" Daily Activity  Outcome Measure Difficulty turning over in bed (including adjusting bedclothes, sheets and blankets)?: Unable Difficulty moving from lying on back to sitting on the side of the bed? : Unable Difficulty sitting down on and standing up from a chair with arms (e.g., wheelchair, bedside commode, etc,.)?: A Little Help needed moving to and from a bed to chair (including a wheelchair)?: A Little Help needed walking in hospital room?: A Little Help needed climbing 3-5 steps with a railing? : A Little 6 Click Score: 14    End of Session Equipment Utilized During Treatment: Gait belt Activity Tolerance: Patient tolerated treatment well Patient left: in chair;with call bell/phone within reach;with family/visitor present Nurse Communication: Mobility status PT Visit Diagnosis: Unsteadiness on feet (R26.81);Pain;Difficulty in walking, not elsewhere classified (R26.2) Pain - Right/Left: Right Pain - part of body: Hip    Time:  0947-0962 PT Time Calculation (min) (ACUTE ONLY): 39 min   Charges:   PT Evaluation $PT Eval Moderate Complexity: 1 Mod PT Treatments $Gait Training: 8-22 mins $Therapeutic Exercise: 8-22 mins   PT G Codes:        Leighton Roach, PT  Acute Rehab Services  [place] 10/24/2017, 10:23 AM

## 2017-10-24 NOTE — Care Management Note (Signed)
Case Management Note  Patient Details  Name: KAIDYN JAVID MRN: [id number] Date of Birth: November 14, 1940  Subjective/Objective:    Right THA                Action/Plan: NCM spoke to pt and wife, Butch Penny at bedside. Pt has RW and 3n1 bedside commode at home. Offered choice for HH/list provided. Pt agreeable to [facility].   Expected Discharge Date:                  Expected Discharge Plan:  [place]  In-House Referral:  NA  Discharge planning Services     Post Acute Care Choice:  Home Health Choice offered to:  Patient  DME Arranged:  N/A DME Agency:  NA  HH Arranged:  PT [place] Agency:  [facility] (formerly [employer])  Status of Service:  Completed, signed off  If discussed at [employer], dates discussed:    Additional Comments:  Erenest Rasher, RN 10/24/2017, 5:21 PM

## 2017-10-24 NOTE — Progress Notes (Addendum)
PATIENT ID: Cody Phelps  MRN: [id number]  DOB/AGE:  09/14/1940 / 77 y.o.  1 Day Post-Op Procedure(s) (LRB): TOTAL HIP ARTHROPLASTY ANTERIOR APPROACH (Right)    PROGRESS NOTE Subjective: Patient is alert, oriented, x1 Nausea, no Vomiting, yes passing gas, . Taking PO well. Denies SOB, Chest or Calf Pain. Using Incentive Spirometer, PAS in place. Ambulate Weightbearing as tolerated with therapy today,Denies any feeling of weakness or dizziness Patient reports pain as  2/10  .    Objective: Vital signs in last 24 hours: Vitals:   10/23/17 2127 10/23/17 2347 10/24/17 0225 10/24/17 0532  BP: (Abnormal) 104/48  (Abnormal) 123/55 (Abnormal) 106/51  Pulse: 85 86 72 70  Resp: 14 14 15 15   Temp: 98.4 F (36.9 C)  (Abnormal) 97.4 F (36.3 C) 97.8 F (36.6 C)  TempSrc: Oral  Oral Oral  SpO2: 98% 98% 98% 99%  Weight:      Height:          Intake/Output from previous day: I/O last 3 completed shifts: In: 2227.1 [P.O.:250; I.V.:1977.1] Out: 1400 [Urine:1100; Blood:300]   Intake/Output this shift: No intake/output data recorded.   LABORATORY DATA: Recent Labs    10/23/17 2131 10/23/17 2324 10/24/17 0438 10/24/17 0739  WBC  --   --  6.0  --   HGB  --   --  8.6*  --   HCT  --   --  25.7*  --   PLT  --   --  203  --   NA  --   --  136  --   K  --   --  4.3  --   CL  --   --  102  --   CO2  --   --  24  --   BUN  --   --  17  --   CREATININE  --   --  0.94  --   GLUCOSE  --   --  148*  --   GLUCAP 196* 157*  --  135*  CALCIUM  --   --  8.5*  --     Examination: Neurologically intact ABD soft Neurovascular intact Sensation intact distally Intact pulses distally Dorsiflexion/Plantar flexion intact Incision: dressing C/D/I No cellulitis present Compartment soft} XR AP&Lat of hip shows well placed\fixed THA  Assessment:   1 Day Post-Op Procedure(s) (LRB): TOTAL HIP ARTHROPLASTY ANTERIOR APPROACH (Right) ADDITIONAL DIAGNOSIS:  Expected Acute Blood Loss AnemiaPreoperative  hemoglobin was 10.9, it is 8.6 today.  We will monitor him for any symptoms. Hypertension and Sleep Apnea, Restless leg syndrome, history of prostate cancer with his seeds and radiationDiabetes type 2  Plan: PT/OT WBAT, THA  DVT Prophylaxis: SCDx72 hrs, ASA 325 mg BID x 2 weeks  DISCHARGE PLAN: Home ,Probably tomorrow  DISCHARGE NEEDS: HHPT, Walker and 3-in-1 comode seat

## 2017-10-24 NOTE — Progress Notes (Signed)
Pt places self on cpap at night. RT will continue to monitor as needed.

## 2017-10-24 NOTE — Progress Notes (Signed)
Physical Therapy Treatment Patient Details Name: Cody Phelps MRN: [id number] DOB: 1940/12/31 Today's Date: 10/24/2017    History of Present Illness Pt presents for R hip THA, direct anterior approach. PMH: HTN, DM2, sleep apnea, prostate cancer, lumbar surgery    PT Comments    Pt ambulated 250' with RW and min-guard A. Has progressed well through the day today. STM deficits notable during session this afternoon (repeated questions), wife present and did not mention so question if this is his baseline. Pt eager to practice steps tomorrow morning and hoping to go home. PT will continue to follow.   Follow Up Recommendations  Follow surgeon's recommendation for DC plan and follow-up therapies     Equipment Recommendations  None recommended by PT    Recommendations for Other Services       Precautions / Restrictions Precautions Precautions: None Restrictions Weight Bearing Restrictions: Yes RLE Weight Bearing: Weight bearing as tolerated    Mobility  Bed Mobility Overal bed mobility: Needs Assistance Bed Mobility: Sit to Supine       Sit to supine: Min assist   General bed mobility comments: min A for LE's into bed and postioning  Transfers Overall transfer level: Needs assistance Equipment used: Rolling walker (2 wheeled) Transfers: Sit to/from Stand Sit to Stand: Supervision         General transfer comment: pt stood safely to RW  Ambulation/Gait Ambulation/Gait assistance: Min guard Ambulation Distance (Feet): 250 Feet Assistive device: Rolling walker (2 wheeled) Gait Pattern/deviations: Step-through pattern Gait velocity: decreased Gait velocity interpretation: Below normal speed for age/gender General Gait Details: vc's for erect posture and staying within [employer]            Wheelchair Mobility    Modified Rankin (Stroke Patients Only)       Balance Overall balance assessment: Mild deficits observed, not formally tested                                           Cognition Arousal/Alertness: Awake/alert Behavior During Therapy: WFL for tasks assessed/performed Overall Cognitive Status: Within Functional Limits for tasks assessed                                        Exercises Total Joint Exercises Ankle Circles/Pumps: AROM;Both;10 reps;Supine Quad Sets: AROM;Both;10 reps;Seated Heel Slides: AAROM;Right;10 reps;Supine Hip ABduction/ADduction: AAROM;Right;10 reps;Supine Straight Leg Raises: AAROM;Right;Supine;10 reps Long Arc Quad: AROM;Right;10 reps;Seated Marching in Standing: AROM;Both;10 reps;Standing Standing Hip Extension: AROM;Right;10 reps;Standing;Limitations Standing Hip Extension Limitations: pt with restricted R hip extension    General Comments General comments (skin integrity, edema, etc.): pt with STM deficits apparent during conversation this afteroon, baseline vs med effect?      Pertinent Vitals/Pain Pain Assessment: 0-10 Pain Score: 5  Pain Location: R hip Pain Descriptors / Indicators: Aching;Sore    Home Living                      Prior Function            PT Goals (current goals can now be found in the care plan section) Acute Rehab PT Goals Patient Stated Goal: return home PT Goal Formulation: With patient Time For Goal Achievement: 11/07/17 Potential to Achieve Goals: Good Progress towards PT goals: Progressing  toward goals    Frequency    7X/week      PT Plan Current plan remains appropriate    Co-evaluation              AM-PAC PT "6 Clicks" Daily Activity  Outcome Measure  Difficulty turning over in bed (including adjusting bedclothes, sheets and blankets)?: Unable Difficulty moving from lying on back to sitting on the side of the bed? : Unable Difficulty sitting down on and standing up from a chair with arms (e.g., wheelchair, bedside commode, etc,.)?: A Little Help needed moving to and from a bed to chair  (including a wheelchair)?: A Little Help needed walking in hospital room?: A Little Help needed climbing 3-5 steps with a railing? : A Little 6 Click Score: 14    End of Session Equipment Utilized During Treatment: Gait belt Activity Tolerance: Patient tolerated treatment well Patient left: with call bell/phone within reach;with family/visitor present;in bed Nurse Communication: Mobility status PT Visit Diagnosis: Unsteadiness on feet (R26.81);Pain;Difficulty in walking, not elsewhere classified (R26.2) Pain - Right/Left: Right Pain - part of body: Hip     Time: 3846-6599 PT Time Calculation (min) (ACUTE ONLY): 31 min  Charges:  $Gait Training: 23-37 mins                    G Codes:       [place]  [place] 10/24/2017, 4:14 PM

## 2017-10-25 LAB — CBC
HCT: 24.4 % — ABNORMAL LOW (ref 39.0–52.0)
Hemoglobin: 8.6 g/dL — ABNORMAL LOW (ref 13.0–17.0)
MCH: 31.3 pg (ref 26.0–34.0)
MCHC: 35.2 g/dL (ref 30.0–36.0)
MCV: 88.7 fL (ref 78.0–100.0)
Platelets: 212 10*3/uL (ref 150–400)
RBC: 2.75 MIL/uL — ABNORMAL LOW (ref 4.22–5.81)
RDW: 13.8 % (ref 11.5–15.5)
WBC: 9.2 10*3/uL (ref 4.0–10.5)

## 2017-10-25 LAB — GLUCOSE, CAPILLARY: Glucose-Capillary: 127 mg/dL — ABNORMAL HIGH (ref 65–99)

## 2017-10-25 NOTE — Progress Notes (Signed)
Patient discharged to home with instructions and prescriptions. 

## 2017-10-25 NOTE — Progress Notes (Signed)
PATIENT ID: Cody Phelps  MRN: [id number]  DOB/AGE:  75-26-1942 / 77 y.o.  2 Days Post-Op Procedure(s) (LRB): TOTAL HIP ARTHROPLASTY ANTERIOR APPROACH (Right)    PROGRESS NOTE Subjective: Patient is alert, oriented, no Nausea, no Vomiting, yes passing gas, . Taking PO well. Denies SOB, Chest or Calf Pain. Using Incentive Spirometer, PAS in place. Ambulate WBAT with pt walking 250 ft with therapy Patient reports pain as  2-3/10  .    Objective: Vital signs in last 24 hours: Vitals:   10/24/17 1014 10/24/17 1413 10/24/17 2145 10/25/17 0536  BP: (!) 112/41 (!) 123/56 (!) 105/48 (!) 106/50  Pulse: 78 79 79 79  Resp: 16 16 17 18   Temp: (!) 97.5 F (36.4 C) 98.9 F (37.2 C) 99 F (37.2 C) (!) 97.5 F (36.4 C)  TempSrc: Oral Oral Oral Oral  SpO2: 99% 97% 97% 98%  Weight:      Height:          Intake/Output from previous day: I/O last 3 completed shifts: In: 2067.1 [P.O.:1090; I.V.:977.1] Out: 276 [Urine:275; Stool:1]   Intake/Output this shift: No intake/output data recorded.   LABORATORY DATA: Recent Labs    10/24/17 0438  10/24/17 1135 10/24/17 1712 10/24/17 2152 10/25/17 0403  WBC 6.0  --   --   --   --  9.2  HGB 8.6*  --   --   --   --  8.6*  HCT 25.7*  --   --   --   --  24.4*  PLT 203  --   --   --   --  212  NA 136  --   --   --   --   --   K 4.3  --   --   --   --   --   CL 102  --   --   --   --   --   CO2 24  --   --   --   --   --   BUN 17  --   --   --   --   --   CREATININE 0.94  --   --   --   --   --   GLUCOSE 148*  --   --   --   --   --   GLUCAP  --    < > 170* 200* 257*  --   CALCIUM 8.5*  --   --   --   --   --    < > = values in this interval not displayed.    Examination: Neurologically intact Neurovascular intact Sensation intact distally Intact pulses distally Dorsiflexion/Plantar flexion intact Incision: dressing C/D/I and no drainage No cellulitis present Compartment soft} XR AP&Lat of hip shows well placed\fixed THA  Assessment:    2 Days Post-Op Procedure(s) (LRB): TOTAL HIP ARTHROPLASTY ANTERIOR APPROACH (Right) ADDITIONAL DIAGNOSIS:  Expected Acute Blood Loss Anemia, Preoperative hemoglobin was 10.9, it is 8.6 today.  We will monitor him for any symptoms. Hypertension and Sleep Apnea, Restless leg syndrome, history of prostate cancer with his seeds and radiationDiabetes type 2   Plan: PT/OT WBAT, THA  DVT Prophylaxis: SCDx72 hrs, ASA 325 mg BID x 2 weeks  DISCHARGE PLAN: Home  DISCHARGE NEEDS: HHPT, Walker and 3-in-1 comode seat   Pt's dressing will need to be changed to a new aquacell as it is open on the edge.

## 2017-10-25 NOTE — Discharge Summary (Signed)
Patient ID: Cody Phelps MRN: [id number] DOB/AGE: April 24, 1941 77 y.o.  Admit date: 10/23/2017 Discharge date: 10/25/2017  Admission Diagnoses:  Principal Problem:   Osteoarthritis of right hip Active Problems:   Primary osteoarthritis of right hip   Discharge Diagnoses:  Same  Past Medical History:  Diagnosis Date  . Aortic valve stenosis, moderate cardiologist-- ganji   last echo 01-11-2017 by dr [name]-- moderate AV leaflet restricted w/  moderate AV stenosis, AVA 1.16cm^2  . Benign localized prostatic hyperplasia with lower urinary tract symptoms (LUTS)   . Carotid stenosis, left followed by dr [name], cardiologist--- bilateral bruit per note   per dr [name] note 07/4579  LICA 99-83% stenosis per duplex 05-14-2014  . Diverticulosis   . ED (erectile dysfunction)   . First degree heart block   . GERD (gastroesophageal reflux disease)   . Heart murmur   . History of pneumonia 08-22-2016 & 09-01-2016  . Hypertension    cardiologsit-  dr [name]  . Iron deficiency anemia   . Mixed hyperlipidemia   . Neuromuscular disorder ([name])    restless lrg  . OA (osteoarthritis)    right hip  . OSA on CPAP followed by dr dohmeier   per study's 02-08-2005 & 10-29-2008  severe osa uses cpap  . Prostate cancer [facility]) urologist-  dr budzyn/  oncologist-  dr Tammi Klippel   dx 03-17-2017 (bx)  Stage T2a, Gleason 4+4, PSA  4.43, vol 32.32--  scheduled for radiatctive prostate seed implants 07-10-2017 then IMRT and ADT  . RLS (restless legs syndrome)   . Scoliosis   . Trigger finger   . Type 2 diabetes mellitus ([place])   . Wears partial dentures    upper and lower    Surgeries: Procedure(s): TOTAL HIP ARTHROPLASTY ANTERIOR APPROACH on 10/23/2017   Consultants:   Discharged Condition: Improved  Hospital Course: Cody Phelps is an 77 y.o. male who was admitted 10/23/2017 for operative treatment ofOsteoarthritis of right hip. Patient has severe unremitting pain that affects sleep, daily activities, and  work/hobbies. After pre-op clearance the patient was taken to the operating room on 10/23/2017 and underwent  Procedure(s): TOTAL HIP ARTHROPLASTY ANTERIOR APPROACH.    Patient was given perioperative antibiotics:  Anti-infectives (From admission, onward)   Start     Dose/Rate Route Frequency Ordered Stop   10/23/17 1019  ceFAZolin (ANCEF) IVPB 2g/100 mL premix     2 g 200 mL/hr over 30 Minutes Intravenous On call to O.R. 10/23/17 1019 10/23/17 1229       Patient was given sequential compression devices, early ambulation, and chemoprophylaxis to prevent DVT.  Patient benefited maximally from hospital stay and there were no complications.    Recent vital signs:  Patient Vitals for the past 24 hrs:  BP Temp Temp src Pulse Resp SpO2  10/25/17 0536 (!) 106/50 (!) 97.5 F (36.4 C) Oral 79 18 98 %  10/24/17 2145 (!) 105/48 99 F (37.2 C) Oral 79 17 97 %  10/24/17 1413 (!) 123/56 98.9 F (37.2 C) Oral 79 16 97 %  10/24/17 1014 (!) 112/41 (!) 97.5 F (36.4 C) Oral 78 16 99 %     Recent laboratory studies:  Recent Labs    10/24/17 0438 10/25/17 0403  WBC 6.0 9.2  HGB 8.6* 8.6*  HCT 25.7* 24.4*  PLT 203 212  NA 136  --   K 4.3  --   CL 102  --   CO2 24  --   BUN 17  --  CREATININE 0.94  --   GLUCOSE 148*  --   CALCIUM 8.5*  --      Discharge Medications:   Allergies as of 10/25/2017   No Known Allergies     Medication List    STOP taking these medications   acetaminophen 500 MG tablet Commonly known as:  TYLENOL   nabumetone 750 MG tablet Commonly known as:  RELAFEN     TAKE these medications   ACTOPLUS MET XR 30-1000 MG Tb24 Generic drug:  Pioglitazone HCl-Metformin HCl Take 1 tablet by mouth every morning.   amLODIPine-Valsartan-HCTZ 10-160-12.5 MG Tabs Take 1 tablet by mouth every morning.   aspirin EC 325 MG tablet Take 1 tablet (325 mg total) by mouth 2 (two) times daily.   colesevelam 625 MG tablet Commonly known as:  WELCHOL Take 1,875 mg by  mouth daily.   donepezil 10 MG tablet Commonly known as:  ARICEPT Take 10 mg by mouth every morning.   esomeprazole 40 MG capsule Commonly known as:  NEXIUM Take 40 mg by mouth daily as needed (acid reflux).   ezetimibe-simvastatin 10-80 MG tablet Commonly known as:  VYTORIN Take 1 tablet by mouth every morning.   ferrous sulfate 325 (65 FE) MG EC tablet Take 325 mg by mouth 2 (two) times a week.   LANTUS SOLOSTAR 100 UNIT/ML injection Generic drug:  insulin glargine Inject 40 Units into the skin at bedtime.   omega-3 acid ethyl esters 1 g capsule Commonly known as:  LOVAZA Take 2 g by mouth 2 (two) times daily.   oxyCODONE-acetaminophen 5-325 MG tablet Commonly known as:  PERCOCET/ROXICET Take 1 tablet by mouth every 4 (four) hours as needed for severe pain.   rOPINIRole 2 MG tablet Commonly known as:  REQUIP Take 1 mg by mouth 2 (two) times daily.   tamsulosin 0.4 MG Caps capsule Commonly known as:  FLOMAX Take 0.4 mg by mouth daily after supper.   tiZANidine 2 MG tablet Commonly known as:  ZANAFLEX Take 1 tablet (2 mg total) by mouth every 6 (six) hours as needed.   vitamin C 1000 MG tablet Take 1,000 mg by mouth 2 (two) times a week.            Durable Medical Equipment  (From admission, onward)        Start     Ordered   10/23/17 1737  DME Walker rolling  Once    Question:  Patient needs a walker to treat with the following condition  Answer:  Status post right hip replacement   10/23/17 1736   10/23/17 1737  DME 3 n 1  Once     10/23/17 1736       Discharge Care Instructions  (From admission, onward)        Start     Ordered   10/25/17 0000  Weight bearing as tolerated     10/25/17 0737      Diagnostic Studies: Dg Chest 2 View  Result Date: 10/12/2017 CLINICAL DATA:  Preoperative evaluation for upcoming hip surgery EXAM: CHEST - 2 VIEW COMPARISON:  06/08/2017 FINDINGS: Cardiac shadow is stable. Minimal scarring is again noted in the  left base. No focal infiltrate or sizable effusion is seen. No acute bony abnormality is noted. IMPRESSION: No active cardiopulmonary disease. Electronically Signed   By: Inez Catalina M.D.   On: 10/12/2017 08:29   Dg C-arm 1-60 Min  Result Date: 10/23/2017 CLINICAL DATA:  Right hip replacement EXAM: DG C-ARM 61-120  MIN; OPERATIVE RIGHT HIP WITH PELVIS COMPARISON:  None. FLUOROSCOPY TIME:  Fluoroscopy Time:  18 seconds Radiation Exposure Index (if provided by the fluoroscopic device): Not available Number of Acquired Spot Images: 2 FINDINGS: Right hip replacement is noted in satisfactory position. Prostate brachytherapy seeds are noted. No soft tissue changes are seen. IMPRESSION: Status post right hip replacement without acute abnormality. Electronically Signed   By: Inez Catalina M.D.   On: 10/23/2017 15:00   Dg Hip Port Unilat With Pelvis 1v Right  Result Date: 10/23/2017 CLINICAL DATA:  Postop EXAM: DG HIP (WITH OR WITHOUT PELVIS) 1V PORT RIGHT COMPARISON:  10/23/2017 FINDINGS: Changes of right hip replacement. Normal AP alignment. No hardware or bony complicating feature. Radiation seeds in the region of the prostate. IMPRESSION: Right hip replacement.  No visible complicating feature. Electronically Signed   By: Rolm Baptise M.D.   On: 10/23/2017 18:49   Dg Hip Operative Unilat W Or W/o Pelvis Right  Result Date: 10/23/2017 CLINICAL DATA:  Right hip replacement EXAM: DG C-ARM 61-120 MIN; OPERATIVE RIGHT HIP WITH PELVIS COMPARISON:  None. FLUOROSCOPY TIME:  Fluoroscopy Time:  18 seconds Radiation Exposure Index (if provided by the fluoroscopic device): Not available Number of Acquired Spot Images: 2 FINDINGS: Right hip replacement is noted in satisfactory position. Prostate brachytherapy seeds are noted. No soft tissue changes are seen. IMPRESSION: Status post right hip replacement without acute abnormality. Electronically Signed   By: Inez Catalina M.D.   On: 10/23/2017 15:00    Disposition:  Discharge disposition: 01-Home or Self Care       Discharge Instructions    Call MD / Call 911   Complete by:  As directed    If you experience chest pain or shortness of breath, CALL 911 and be transported to the hospital emergency room.  If you develope a fever above 101 F, pus (white drainage) or increased drainage or redness at the wound, or calf pain, call your surgeon's office.   Constipation Prevention   Complete by:  As directed    Drink plenty of fluids.  Prune juice may be helpful.  You may use a stool softener, such as Colace (over the counter) 100 mg twice a day.  Use MiraLax (over the counter) for constipation as needed.   Diet - low sodium heart healthy   Complete by:  As directed    Driving restrictions   Complete by:  As directed    No driving for 2 weeks   Follow the hip precautions as taught in Physical Therapy   Complete by:  As directed    Increase activity slowly as tolerated   Complete by:  As directed    Patient may shower   Complete by:  As directed    You may shower without a dressing once there is no drainage.  Do not wash over the wound.  If drainage remains, cover wound with plastic wrap and then shower.   Weight bearing as tolerated   Complete by:  As directed       Follow-up Information    Frederik Pear, MD In 2 weeks.   Specialty:  Orthopedic Surgery Contact information: [place] [address] [phone]        [facility] Follow up.   Specialty:  [place] Why:  [facility] will call to arrange initial appointment Contact information: [address] [place]  [address] [phone]  Signed: Joanell Rising 10/25/2017, 7:37 AM

## 2017-10-25 NOTE — Progress Notes (Signed)
Physical Therapy Treatment and Discharge Patient Details Name: Cody Phelps MRN: [id number] DOB: 15-Oct-1940 Today's Date: 10/25/2017    History of Present Illness Pt presents for R hip THA, direct anterior approach. PMH: HTN, DM2, sleep apnea, prostate cancer, lumbar surgery    PT Comments    Patient progressing well with therapy today. Session focused on stair training, pt supervision level for all mobility. Pt and family educated on safety considerations for home and have no further questions or concerns at this time. Pt has met all functional goals and will benefit from skilled home health PT when medically cleared for d/c.     Follow Up Recommendations  Follow surgeon's recommendation for DC plan and follow-up therapies     Equipment Recommendations  None recommended by PT    Recommendations for Other Services       Precautions / Restrictions Precautions Precautions: None Restrictions Weight Bearing Restrictions: Yes RLE Weight Bearing: Weight bearing as tolerated    Mobility  Bed Mobility Overal bed mobility: Needs Assistance Bed Mobility: Sit to Supine     Supine to sit: Supervision Sit to supine: Supervision      Transfers Overall transfer level: Needs assistance Equipment used: Rolling walker (2 wheeled) Transfers: Sit to/from Stand Sit to Stand: Supervision         General transfer comment: sit to stand without physical asssitance, good hnad placement.   Ambulation/Gait Ambulation/Gait assistance: Supervision Ambulation Distance (Feet): 250 Feet Assistive device: Rolling walker (2 wheeled) Gait Pattern/deviations: Step-through pattern Gait velocity: decreased       Stairs Stairs: Yes   Stair Management: One rail Right;Sideways;Step to pattern Number of Stairs: 18 General stair comments: several trials of side stepping with BUE support, educated wife on proper guarding body position. Patient progresed to supervision and demonstrated safe  balance  and proper sequencing.   Wheelchair Mobility    Modified Rankin (Stroke Patients Only)       Balance Overall balance assessment: Mild deficits observed, not formally tested                                          Cognition Arousal/Alertness: Awake/alert Behavior During Therapy: WFL for tasks assessed/performed Overall Cognitive Status: Within Functional Limits for tasks assessed                                        Exercises      General Comments        Pertinent Vitals/Pain Pain Assessment: Faces Faces Pain Scale: Hurts a little bit Pain Location: R hip Pain Descriptors / Indicators: Aching;Sore Pain Intervention(s): Limited activity within patient's tolerance;Monitored during session;Premedicated before session;Repositioned    Home Living                      Prior Function            PT Goals (current goals can now be found in the care plan section) Acute Rehab PT Goals Patient Stated Goal: return home PT Goal Formulation: With patient Time For Goal Achievement: 11/07/17 Potential to Achieve Goals: Good Progress towards PT goals: Goals met/education completed, patient discharged from PT    Frequency    7X/week      PT Plan Current plan remains appropriate    Co-evaluation  AM-PAC PT "6 Clicks" Daily Activity  Outcome Measure  Difficulty turning over in bed (including adjusting bedclothes, sheets and blankets)?: A Little Difficulty moving from lying on back to sitting on the side of the bed? : A Little Difficulty sitting down on and standing up from a chair with arms (e.g., wheelchair, bedside commode, etc,.)?: A Little Help needed moving to and from a bed to chair (including a wheelchair)?: A Little Help needed walking in hospital room?: A Little Help needed climbing 3-5 steps with a railing? : A Little 6 Click Score: 18    End of Session Equipment Utilized During  Treatment: Gait belt Activity Tolerance: Patient tolerated treatment well Patient left: with call bell/phone within reach;with family/visitor present;in bed Nurse Communication: Mobility status PT Visit Diagnosis: Unsteadiness on feet (R26.81);Pain;Difficulty in walking, not elsewhere classified (R26.2) Pain - Right/Left: Right Pain - part of body: Hip     Time: 8469-6295 PT Time Calculation (min) (ACUTE ONLY): 26 min  Charges:  $Gait Training: 23-37 mins                    G Codes:       Reinaldo Berber, PT, DPT Acute Rehab Services Pager: [phone]     Reinaldo Berber 10/25/2017, 8:47 AM

## 2018-03-01 ENCOUNTER — Emergency Department (HOSPITAL_COMMUNITY): Payer: Medicare Other

## 2018-03-01 ENCOUNTER — Other Ambulatory Visit (HOSPITAL_COMMUNITY): Payer: Medicare Other

## 2018-03-01 ENCOUNTER — Inpatient Hospital Stay (HOSPITAL_COMMUNITY)
Admission: EM | Admit: 2018-03-01 | Discharge: 2018-03-05 | DRG: 250 | Disposition: A | Discharge status: Home / Self Care | Payer: Medicare Other | Attending: Cardiology | Admitting: Cardiology

## 2018-03-01 ENCOUNTER — Encounter (HOSPITAL_COMMUNITY): Payer: Self-pay

## 2018-03-01 ENCOUNTER — Inpatient Hospital Stay (HOSPITAL_COMMUNITY): Payer: Medicare Other

## 2018-03-01 ENCOUNTER — Other Ambulatory Visit: Payer: Self-pay

## 2018-03-01 ENCOUNTER — Encounter (HOSPITAL_COMMUNITY)
Admission: EM | Disposition: A | Discharge status: Home / Self Care | Payer: Self-pay | Source: Home / Self Care | Attending: Cardiology

## 2018-03-01 DIAGNOSIS — Z961 Presence of intraocular lens: Secondary | ICD-10-CM | POA: Diagnosis present

## 2018-03-01 DIAGNOSIS — I10 Essential (primary) hypertension: Secondary | ICD-10-CM | POA: Diagnosis present

## 2018-03-01 DIAGNOSIS — Z87891 Personal history of nicotine dependence: Secondary | ICD-10-CM

## 2018-03-01 DIAGNOSIS — Z8546 Personal history of malignant neoplasm of prostate: Secondary | ICD-10-CM | POA: Diagnosis not present

## 2018-03-01 DIAGNOSIS — D509 Iron deficiency anemia, unspecified: Secondary | ICD-10-CM | POA: Diagnosis present

## 2018-03-01 DIAGNOSIS — Z794 Long term (current) use of insulin: Secondary | ICD-10-CM | POA: Diagnosis not present

## 2018-03-01 DIAGNOSIS — I2109 ST elevation (STEMI) myocardial infarction involving other coronary artery of anterior wall: Principal | ICD-10-CM | POA: Diagnosis present

## 2018-03-01 DIAGNOSIS — E782 Mixed hyperlipidemia: Secondary | ICD-10-CM | POA: Diagnosis present

## 2018-03-01 DIAGNOSIS — Z9842 Cataract extraction status, left eye: Secondary | ICD-10-CM

## 2018-03-01 DIAGNOSIS — I350 Nonrheumatic aortic (valve) stenosis: Secondary | ICD-10-CM

## 2018-03-01 DIAGNOSIS — Z9841 Cataract extraction status, right eye: Secondary | ICD-10-CM | POA: Diagnosis not present

## 2018-03-01 DIAGNOSIS — Z79899 Other long term (current) drug therapy: Secondary | ICD-10-CM | POA: Diagnosis not present

## 2018-03-01 DIAGNOSIS — I4892 Unspecified atrial flutter: Secondary | ICD-10-CM | POA: Diagnosis present

## 2018-03-01 DIAGNOSIS — E669 Obesity, unspecified: Secondary | ICD-10-CM | POA: Diagnosis present

## 2018-03-01 DIAGNOSIS — Z7982 Long term (current) use of aspirin: Secondary | ICD-10-CM

## 2018-03-01 DIAGNOSIS — N400 Enlarged prostate without lower urinary tract symptoms: Secondary | ICD-10-CM | POA: Diagnosis present

## 2018-03-01 DIAGNOSIS — I2510 Atherosclerotic heart disease of native coronary artery without angina pectoris: Secondary | ICD-10-CM | POA: Diagnosis present

## 2018-03-01 DIAGNOSIS — I213 ST elevation (STEMI) myocardial infarction of unspecified site: Secondary | ICD-10-CM

## 2018-03-01 DIAGNOSIS — E1165 Type 2 diabetes mellitus with hyperglycemia: Secondary | ICD-10-CM | POA: Diagnosis present

## 2018-03-01 DIAGNOSIS — J189 Pneumonia, unspecified organism: Secondary | ICD-10-CM | POA: Diagnosis not present

## 2018-03-01 DIAGNOSIS — Z8249 Family history of ischemic heart disease and other diseases of the circulatory system: Secondary | ICD-10-CM

## 2018-03-01 DIAGNOSIS — Z6830 Body mass index (BMI) 30.0-30.9, adult: Secondary | ICD-10-CM

## 2018-03-01 DIAGNOSIS — I480 Paroxysmal atrial fibrillation: Secondary | ICD-10-CM

## 2018-03-01 DIAGNOSIS — G4733 Obstructive sleep apnea (adult) (pediatric): Secondary | ICD-10-CM | POA: Diagnosis present

## 2018-03-01 DIAGNOSIS — R079 Chest pain, unspecified: Secondary | ICD-10-CM | POA: Diagnosis present

## 2018-03-01 HISTORY — PX: CORONARY BALLOON ANGIOPLASTY: CATH118233

## 2018-03-01 HISTORY — DX: ST elevation (STEMI) myocardial infarction involving other coronary artery of anterior wall: I21.09

## 2018-03-01 HISTORY — PX: CORONARY/GRAFT ACUTE MI REVASCULARIZATION: CATH118305

## 2018-03-01 HISTORY — PX: LEFT HEART CATH AND CORONARY ANGIOGRAPHY: CATH118249

## 2018-03-01 LAB — COMPREHENSIVE METABOLIC PANEL
ALT: 32 U/L (ref 0–44)
AST: 90 U/L — ABNORMAL HIGH (ref 15–41)
Albumin: 3.5 g/dL (ref 3.5–5.0)
Alkaline Phosphatase: 34 U/L — ABNORMAL LOW (ref 38–126)
Anion gap: 11 (ref 5–15)
BUN: 10 mg/dL (ref 8–23)
CO2: 24 mmol/L (ref 22–32)
Calcium: 9 mg/dL (ref 8.9–10.3)
Chloride: 103 mmol/L (ref 98–111)
Creatinine, Ser: 0.93 mg/dL (ref 0.61–1.24)
GFR calc Af Amer: >60 mL/min (ref >60)
GFR calc non Af Amer: >60 mL/min (ref >60)
Glucose, Bld: 172 mg/dL — ABNORMAL HIGH (ref 70–99)
Potassium: 3.6 mmol/L (ref 3.5–5.1)
Sodium: 138 mmol/L (ref 135–145)
Total Bilirubin: 1.1 mg/dL (ref 0.3–1.2)
Total Protein: 6.3 g/dL — ABNORMAL LOW (ref 6.5–8.1)

## 2018-03-01 LAB — CBC WITH DIFFERENTIAL/PLATELET
Abs Immature Granulocytes: 0 10*3/uL (ref 0.0–0.1)
Basophils Absolute: 0 10*3/uL (ref 0.0–0.1)
Basophils Relative: 0 %
Eosinophils Absolute: 0.1 10*3/uL (ref 0.0–0.7)
Eosinophils Relative: 1 %
HCT: 30.5 % — ABNORMAL LOW (ref 39.0–52.0)
Hemoglobin: 9.5 g/dL — ABNORMAL LOW (ref 13.0–17.0)
Immature Granulocytes: 0 %
Lymphocytes Relative: 4 %
Lymphs Abs: 0.4 10*3/uL — ABNORMAL LOW (ref 0.7–4.0)
MCH: 27.7 pg (ref 26.0–34.0)
MCHC: 31.1 g/dL (ref 30.0–36.0)
MCV: 88.9 fL (ref 78.0–100.0)
Monocytes Absolute: 0.8 10*3/uL (ref 0.1–1.0)
Monocytes Relative: 8 %
Neutro Abs: 8.9 10*3/uL — ABNORMAL HIGH (ref 1.7–7.7)
Neutrophils Relative %: 87 %
Platelets: 206 10*3/uL (ref 150–400)
RBC: 3.43 MIL/uL — ABNORMAL LOW (ref 4.22–5.81)
RDW: 14.8 % (ref 11.5–15.5)
WBC: 10.2 10*3/uL (ref 4.0–10.5)

## 2018-03-01 LAB — POCT I-STAT, CHEM 8
BUN: 10 mg/dL (ref 8–23)
Calcium, Ion: 1.22 mmol/L (ref 1.15–1.40)
Chloride: 101 mmol/L (ref 98–111)
Creatinine, Ser: 0.7 mg/dL (ref 0.61–1.24)
Glucose, Bld: 171 mg/dL — ABNORMAL HIGH (ref 70–99)
HCT: 28 % — ABNORMAL LOW (ref 39.0–52.0)
Hemoglobin: 9.5 g/dL — ABNORMAL LOW (ref 13.0–17.0)
Potassium: 3.4 mmol/L — ABNORMAL LOW (ref 3.5–5.1)
Sodium: 137 mmol/L (ref 135–145)
TCO2: 23 mmol/L (ref 22–32)

## 2018-03-01 LAB — CBC
HCT: 31.5 % — ABNORMAL LOW (ref 39.0–52.0)
Hemoglobin: 10 g/dL — ABNORMAL LOW (ref 13.0–17.0)
MCH: 27.8 pg (ref 26.0–34.0)
MCHC: 31.7 g/dL (ref 30.0–36.0)
MCV: 87.5 fL (ref 78.0–100.0)
Platelets: 189 10*3/uL (ref 150–400)
RBC: 3.6 MIL/uL — ABNORMAL LOW (ref 4.22–5.81)
RDW: 15 % (ref 11.5–15.5)
WBC: 11.1 10*3/uL — ABNORMAL HIGH (ref 4.0–10.5)

## 2018-03-01 LAB — CREATININE, SERUM
Creatinine, Ser: 0.83 mg/dL (ref 0.61–1.24)
GFR calc Af Amer: >60 mL/min (ref >60)
GFR calc non Af Amer: >60 mL/min (ref >60)

## 2018-03-01 LAB — LIPID PANEL
Cholesterol: 151 mg/dL (ref 0–200)
HDL: 55 mg/dL (ref >40)
LDL Cholesterol: 82 mg/dL (ref 0–99)
Total CHOL/HDL Ratio: 2.7 RATIO
Triglycerides: 69 mg/dL (ref <150)
VLDL: 14 mg/dL (ref 0–40)

## 2018-03-01 LAB — TROPONIN I
Troponin I: 24.93 ng/mL (ref <0.03)
Troponin I: 22.43 ng/mL (ref <0.03)
Troponin I: 26.12 ng/mL (ref <0.03)
Troponin I: 22.81 ng/mL (ref <0.03)

## 2018-03-01 LAB — D-DIMER, QUANTITATIVE (NOT AT ARMC): D-Dimer, Quant: 3.49 ug/mL-FEU — ABNORMAL HIGH (ref 0.00–0.50)

## 2018-03-01 LAB — GLUCOSE, CAPILLARY: Glucose-Capillary: 130 mg/dL — ABNORMAL HIGH (ref 70–99)

## 2018-03-01 LAB — APTT: aPTT: 31 seconds (ref 24–36)

## 2018-03-01 LAB — MRSA PCR SCREENING: MRSA by PCR: NEGATIVE

## 2018-03-01 LAB — POCT ACTIVATED CLOTTING TIME
Activated Clotting Time: 224 seconds
Activated Clotting Time: 230 seconds

## 2018-03-01 LAB — HEMOGLOBIN A1C
Hgb A1c MFr Bld: 6.4 % — ABNORMAL HIGH (ref 4.8–5.6)
Mean Plasma Glucose: 136.98 mg/dL

## 2018-03-01 LAB — HEPARIN LEVEL (UNFRACTIONATED): Heparin Unfractionated: 0.13 IU/mL — ABNORMAL LOW (ref 0.30–0.70)

## 2018-03-01 LAB — PROTIME-INR
INR: 1.18
Prothrombin Time: 14.9 seconds (ref 11.4–15.2)

## 2018-03-01 SURGERY — CORONARY/GRAFT ACUTE MI REVASCULARIZATION
Anesthesia: LOCAL

## 2018-03-01 MED ORDER — VITAMIN C 500 MG PO TABS
1000.0000 mg | ORAL_TABLET | ORAL | Status: DC
  Administered 2018-03-01: 1000 mg via ORAL
  Filled 2018-03-01: qty 2

## 2018-03-01 MED ORDER — EZETIMIBE-SIMVASTATIN 10-80 MG PO TABS
1.0000 | ORAL_TABLET | Freq: Every morning | ORAL | Status: DC
  Administered 2018-03-01: 1 via ORAL
  Filled 2018-03-01 (×2): qty 1

## 2018-03-01 MED ORDER — SODIUM CHLORIDE 0.9% FLUSH
3.0000 mL | Freq: Two times a day (BID) | INTRAVENOUS | Status: DC
  Administered 2018-03-01: 3 mL via INTRAVENOUS
  Administered 2018-03-02: 10 mL via INTRAVENOUS
  Administered 2018-03-03 – 2018-03-04 (×3): 3 mL via INTRAVENOUS

## 2018-03-01 MED ORDER — NITROGLYCERIN 1 MG/10 ML FOR IR/CATH LAB
INTRA_ARTERIAL | Status: AC
  Filled 2018-03-01: qty 10

## 2018-03-01 MED ORDER — HEPARIN SODIUM (PORCINE) 5000 UNIT/ML IJ SOLN: 5000.0000 [IU] | Freq: Three times a day (TID) | INTRAMUSCULAR | Status: DC

## 2018-03-01 MED ORDER — HEPARIN SODIUM (PORCINE) 1000 UNIT/ML IJ SOLN
INTRAMUSCULAR | Status: DC | PRN
  Administered 2018-03-01: 3000 [IU] via INTRAVENOUS
  Administered 2018-03-01: 5000 [IU] via INTRAVENOUS

## 2018-03-01 MED ORDER — ONDANSETRON HCL 4 MG/2ML IJ SOLN: 4.0000 mg | Freq: Four times a day (QID) | INTRAMUSCULAR | Status: DC | PRN

## 2018-03-01 MED ORDER — METOPROLOL TARTRATE 25 MG PO TABS
25.0000 mg | ORAL_TABLET | Freq: Two times a day (BID) | ORAL | Status: DC
  Administered 2018-03-01 – 2018-03-04 (×7): 25 mg via ORAL
  Filled 2018-03-01 (×7): qty 1

## 2018-03-01 MED ORDER — LIDOCAINE HCL (PF) 1 % IJ SOLN
INTRAMUSCULAR | Status: DC | PRN
  Administered 2018-03-01: 2 mL

## 2018-03-01 MED ORDER — SODIUM CHLORIDE 0.9 % IV SOLN: 250.0000 mL | INTRAVENOUS | Status: DC | PRN

## 2018-03-01 MED ORDER — ORAL CARE MOUTH RINSE
15.0000 mL | Freq: Two times a day (BID) | OROMUCOSAL | Status: DC
  Administered 2018-03-01 – 2018-03-04 (×4): 15 mL via OROMUCOSAL

## 2018-03-01 MED ORDER — SODIUM CHLORIDE 0.9% FLUSH: 3.0000 mL | INTRAVENOUS | Status: DC | PRN

## 2018-03-01 MED ORDER — SODIUM CHLORIDE 0.9 % IV SOLN: INTRAVENOUS | Status: DC

## 2018-03-01 MED ORDER — HEPARIN (PORCINE) IN NACL 100-0.45 UNIT/ML-% IJ SOLN
1900.0000 [IU]/h | INTRAMUSCULAR | Status: DC
  Administered 2018-03-01: 1300 [IU]/h via INTRAVENOUS
  Administered 2018-03-02: 1500 [IU]/h via INTRAVENOUS
  Administered 2018-03-02: 1900 [IU]/h via INTRAVENOUS
  Filled 2018-03-01 (×3): qty 250

## 2018-03-01 MED ORDER — SODIUM CHLORIDE 0.9 % IV SOLN
INTRAVENOUS | Status: DC
  Administered 2018-03-01: 07:00:00 via INTRAVENOUS

## 2018-03-01 MED ORDER — HYDRALAZINE HCL 20 MG/ML IJ SOLN: 5.0000 mg | INTRAMUSCULAR | Status: AC | PRN

## 2018-03-01 MED ORDER — IOPAMIDOL (ISOVUE-370) INJECTION 76%
INTRAVENOUS | Status: AC
  Filled 2018-03-01: qty 125

## 2018-03-01 MED ORDER — MIDAZOLAM HCL 2 MG/2ML IJ SOLN
INTRAMUSCULAR | Status: DC | PRN
  Administered 2018-03-01: 2 mg via INTRAVENOUS

## 2018-03-01 MED ORDER — SODIUM CHLORIDE 0.9 % WEIGHT BASED INFUSION
1.0000 mL/kg/h | INTRAVENOUS | Status: AC
  Administered 2018-03-01: 1 mL/kg/h via INTRAVENOUS

## 2018-03-01 MED ORDER — PSEUDOEPHEDRINE HCL 30 MG PO TABS
30.0000 mg | ORAL_TABLET | ORAL | Status: DC | PRN
  Filled 2018-03-01: qty 1

## 2018-03-01 MED ORDER — ACETAMINOPHEN 325 MG PO TABS
650.0000 mg | ORAL_TABLET | ORAL | Status: DC | PRN
  Administered 2018-03-01 – 2018-03-02 (×3): 650 mg via ORAL
  Filled 2018-03-01 (×3): qty 2

## 2018-03-01 MED ORDER — HEPARIN SODIUM (PORCINE) 1000 UNIT/ML IJ SOLN
INTRAMUSCULAR | Status: AC
  Filled 2018-03-01: qty 1

## 2018-03-01 MED ORDER — INSULIN GLARGINE 100 UNIT/ML ~~LOC~~ SOLN
20.0000 [IU] | Freq: Every day | SUBCUTANEOUS | Status: DC
  Administered 2018-03-03: 20 [IU] via SUBCUTANEOUS
  Filled 2018-03-01 (×4): qty 0.2

## 2018-03-01 MED ORDER — PANTOPRAZOLE SODIUM 40 MG PO TBEC
40.0000 mg | DELAYED_RELEASE_TABLET | Freq: Every day | ORAL | Status: DC
  Administered 2018-03-01 – 2018-03-04 (×4): 40 mg via ORAL
  Filled 2018-03-01 (×4): qty 1

## 2018-03-01 MED ORDER — TICAGRELOR 90 MG PO TABS: 90.0000 mg | ORAL_TABLET | Freq: Two times a day (BID) | ORAL | Status: DC

## 2018-03-01 MED ORDER — LIDOCAINE HCL (PF) 1 % IJ SOLN
INTRAMUSCULAR | Status: AC
  Filled 2018-03-01: qty 30

## 2018-03-01 MED ORDER — FLUTICASONE PROPIONATE 50 MCG/ACT NA SUSP
1.0000 | Freq: Every day | NASAL | Status: DC | PRN
  Filled 2018-03-01: qty 16

## 2018-03-01 MED ORDER — HEPARIN (PORCINE) IN NACL 1000-0.9 UT/500ML-% IV SOLN
INTRAVENOUS | Status: AC
  Filled 2018-03-01: qty 1000

## 2018-03-01 MED ORDER — SODIUM CHLORIDE 0.9 % IV SOLN
INTRAVENOUS | Status: AC | PRN
  Administered 2018-03-01: 250 mL/h via INTRAVENOUS

## 2018-03-01 MED ORDER — DONEPEZIL HCL 10 MG PO TABS
10.0000 mg | ORAL_TABLET | Freq: Every morning | ORAL | Status: DC
  Administered 2018-03-01 – 2018-03-04 (×4): 10 mg via ORAL
  Filled 2018-03-01 (×4): qty 1

## 2018-03-01 MED ORDER — HEPARIN SODIUM (PORCINE) 5000 UNIT/ML IJ SOLN
INTRAMUSCULAR | Status: AC
  Filled 2018-03-01: qty 1

## 2018-03-01 MED ORDER — FUROSEMIDE 10 MG/ML IJ SOLN
40.0000 mg | Freq: Once | INTRAMUSCULAR | Status: AC
  Administered 2018-03-01: 40 mg via INTRAVENOUS
  Filled 2018-03-01: qty 4

## 2018-03-01 MED ORDER — HEPARIN (PORCINE) IN NACL 1000-0.9 UT/500ML-% IV SOLN
INTRAVENOUS | Status: DC | PRN
  Administered 2018-03-01 (×2): 500 mL

## 2018-03-01 MED ORDER — COLESEVELAM HCL 625 MG PO TABS
1875.0000 mg | ORAL_TABLET | Freq: Every day | ORAL | Status: DC
  Administered 2018-03-01 – 2018-03-04 (×4): 1875 mg via ORAL
  Filled 2018-03-01 (×4): qty 3

## 2018-03-01 MED ORDER — TICAGRELOR 90 MG PO TABS
180.0000 mg | ORAL_TABLET | Freq: Once | ORAL | Status: AC
  Administered 2018-03-01: 180 mg via ORAL
  Filled 2018-03-01: qty 2

## 2018-03-01 MED ORDER — TIZANIDINE HCL 4 MG PO TABS: 2.0000 mg | ORAL_TABLET | Freq: Four times a day (QID) | ORAL | Status: DC | PRN

## 2018-03-01 MED ORDER — HYDROMORPHONE HCL 1 MG/ML IJ SOLN
INTRAMUSCULAR | Status: AC
Start: 2018-03-01 — End: ?
  Filled 2018-03-01: qty 0.5

## 2018-03-01 MED ORDER — IOHEXOL 350 MG/ML SOLN
INTRAVENOUS | Status: DC | PRN
  Administered 2018-03-01: 70 mL via INTRA_ARTERIAL

## 2018-03-01 MED ORDER — HYDROMORPHONE HCL 1 MG/ML IJ SOLN
INTRAMUSCULAR | Status: DC | PRN
  Administered 2018-03-01: 0.5 mg via INTRAVENOUS

## 2018-03-01 MED ORDER — ROPINIROLE HCL 1 MG PO TABS
1.0000 mg | ORAL_TABLET | Freq: Every day | ORAL | Status: DC
  Administered 2018-03-01: 1 mg via ORAL
  Filled 2018-03-01 (×2): qty 1

## 2018-03-01 MED ORDER — IOPAMIDOL (ISOVUE-370) INJECTION 76%
INTRAVENOUS | Status: DC | PRN
  Administered 2018-03-01: 115 mL via INTRA_ARTERIAL

## 2018-03-01 MED ORDER — TICAGRELOR 90 MG PO TABS
90.0000 mg | ORAL_TABLET | Freq: Two times a day (BID) | ORAL | Status: DC
  Administered 2018-03-01 – 2018-03-03 (×4): 90 mg via ORAL
  Filled 2018-03-01 (×4): qty 1

## 2018-03-01 MED ORDER — TAMSULOSIN HCL 0.4 MG PO CAPS
0.4000 mg | ORAL_CAPSULE | Freq: Every day | ORAL | Status: DC
  Administered 2018-03-01 – 2018-03-03 (×3): 0.4 mg via ORAL
  Filled 2018-03-01 (×3): qty 1

## 2018-03-01 MED ORDER — VERAPAMIL HCL 2.5 MG/ML IV SOLN
INTRAVENOUS | Status: AC
  Filled 2018-03-01: qty 2

## 2018-03-01 MED ORDER — ROPINIROLE HCL 1 MG PO TABS
2.0000 mg | ORAL_TABLET | Freq: Two times a day (BID) | ORAL | Status: DC | PRN
Start: 2018-03-01 — End: 2018-03-04
  Administered 2018-03-01: 4 mg via ORAL
  Administered 2018-03-03: 2 mg via ORAL
  Administered 2018-03-03: 4 mg via ORAL
  Filled 2018-03-01 (×4): qty 4

## 2018-03-01 MED ORDER — ASPIRIN EC 81 MG PO TBEC: 81.0000 mg | DELAYED_RELEASE_TABLET | Freq: Every day | ORAL | Status: DC

## 2018-03-01 MED ORDER — OMEGA-3-ACID ETHYL ESTERS 1 G PO CAPS
2.0000 g | ORAL_CAPSULE | Freq: Every day | ORAL | Status: DC
  Administered 2018-03-01 – 2018-03-04 (×4): 2 g via ORAL
  Filled 2018-03-01 (×4): qty 2

## 2018-03-01 MED ORDER — MIDAZOLAM HCL 2 MG/2ML IJ SOLN
INTRAMUSCULAR | Status: AC
  Filled 2018-03-01: qty 2

## 2018-03-01 MED ORDER — ACETAMINOPHEN 325 MG PO TABS: 650.0000 mg | ORAL_TABLET | ORAL | Status: DC | PRN

## 2018-03-01 MED ORDER — HEPARIN SODIUM (PORCINE) 5000 UNIT/ML IJ SOLN
4000.0000 [IU] | Freq: Once | INTRAMUSCULAR | Status: AC
  Administered 2018-03-01: 4000 [IU] via INTRAVENOUS

## 2018-03-01 MED ORDER — HEPARIN SODIUM (PORCINE) 5000 UNIT/ML IJ SOLN: 4000.0000 [IU] | Freq: Once | INTRAMUSCULAR | Status: DC

## 2018-03-01 MED ORDER — LABETALOL HCL 5 MG/ML IV SOLN: 10.0000 mg | INTRAVENOUS | Status: AC | PRN

## 2018-03-01 MED ORDER — OXYCODONE-ACETAMINOPHEN 5-325 MG PO TABS
1.0000 | ORAL_TABLET | ORAL | Status: DC | PRN
  Administered 2018-03-03 – 2018-03-04 (×2): 1 via ORAL
  Filled 2018-03-01 (×2): qty 1

## 2018-03-01 MED ORDER — ALPRAZOLAM 0.5 MG PO TABS: 1.0000 mg | ORAL_TABLET | Freq: Two times a day (BID) | ORAL | Status: DC | PRN

## 2018-03-01 MED ORDER — ASPIRIN 81 MG PO CHEW
81.0000 mg | CHEWABLE_TABLET | Freq: Every day | ORAL | Status: DC
  Administered 2018-03-02 – 2018-03-04 (×3): 81 mg via ORAL
  Filled 2018-03-01 (×3): qty 1

## 2018-03-01 MED ORDER — VERAPAMIL HCL 2.5 MG/ML IV SOLN
INTRAVENOUS | Status: DC | PRN
  Administered 2018-03-01: 10 mL via INTRA_ARTERIAL

## 2018-03-01 MED ORDER — NABUMETONE 500 MG PO TABS
750.0000 mg | ORAL_TABLET | Freq: Two times a day (BID) | ORAL | Status: DC | PRN
  Filled 2018-03-01: qty 2

## 2018-03-01 MED ORDER — ASPIRIN EC 81 MG PO TBEC: 324.0000 mg | DELAYED_RELEASE_TABLET | Freq: Once | ORAL | Status: AC

## 2018-03-01 MED ORDER — ASPIRIN 81 MG PO CHEW: 324.0000 mg | CHEWABLE_TABLET | Freq: Once | ORAL | Status: DC

## 2018-03-01 MED ORDER — FERROUS SULFATE 325 (65 FE) MG PO TABS
325.0000 mg | ORAL_TABLET | ORAL | Status: DC
  Administered 2018-03-01: 325 mg via ORAL
  Filled 2018-03-01: qty 1

## 2018-03-01 SURGICAL SUPPLY — 17 items
BALLN SAPPHIRE 2.0X15 (BALLOONS) ×2
BALLOON SAPPHIRE 2.0X15 (BALLOONS) IMPLANT
CATH INFINITI 5FR AL1 (CATHETERS) ×1 IMPLANT
CATH INFINITI JR4 5F (CATHETERS) ×1 IMPLANT
CATH OPTITORQUE TIG 4.0 5F (CATHETERS) ×1 IMPLANT
CATH VISTA GUIDE 6FR XBLAD3.5 (CATHETERS) ×1 IMPLANT
DEVICE RAD COMP TR BAND LRG (VASCULAR PRODUCTS) ×1 IMPLANT
GLIDESHEATH SLEND A-KIT 6F 20G (SHEATH) ×1 IMPLANT
GUIDEWIRE INQWIRE 1.5J.035X260 (WIRE) IMPLANT
INQWIRE 1.5J .035X260CM (WIRE) ×2
KIT ENCORE 26 ADVANTAGE (KITS) ×1 IMPLANT
KIT HEART LEFT (KITS) ×2 IMPLANT
KIT HEMO VALVE WATCHDOG (MISCELLANEOUS) ×1 IMPLANT
PACK CARDIAC CATHETERIZATION (CUSTOM PROCEDURE TRAY) ×2 IMPLANT
TRANSDUCER W/STOPCOCK (MISCELLANEOUS) ×2 IMPLANT
TUBING CIL FLEX 10 FLL-RA (TUBING) ×2 IMPLANT
WIRE COUGAR XT STRL 190CM (WIRE) ×1 IMPLANT

## 2018-03-01 NOTE — H&P (Signed)
Cody Phelps is an 77 y.o. male.   Chief Complaint: Chest pain HPI: Cody Phelps  is a 77 y.o. male  With moderate aortic stenosis, uncontrolled hypertension,  prostate cancer, sedentary lifestyle, obesity, controlled type 2 diabetes mellitus without complications, obstructive sleep apnea on CPAP, asymptomatic moderate carotid artery stenosis, mixed hyperlipidemia developed severe chest pain that started on 02/28/2018 at 9 AM and has been persistent.  Occasionally he has had worsening and improvement in chest pain, located centrally without radiation.  He also had fever of 101 F last night.  Due to persistent chest pain he presented to the emergency room via EMS and was found to have ST elevation in V1 to V4.  Chest pain 8-10 out of 10 in intensity.  Also complains of worsening chest pain on taking deep breath.  Has dry cough.  No hemoptysis.  No leg edema.  Past Medical History:  Diagnosis Date  . Aortic valve stenosis, moderate cardiologist-- Breunna Nordmann   last echo 01-11-2017 by dr [name]-- moderate AV leaflet restricted w/  moderate AV stenosis, AVA 1.16cm^2  . Benign localized prostatic hyperplasia with lower urinary tract symptoms (LUTS)   . Carotid stenosis, left followed by dr [name], cardiologist--- bilateral bruit per note   per dr [name] note 62/7035  LICA 00-93% stenosis per duplex 05-14-2014  . Diverticulosis   . ED (erectile dysfunction)   . First degree heart block   . GERD (gastroesophageal reflux disease)   . Heart murmur   . History of pneumonia 08-22-2016 & 09-01-2016  . Hypertension    cardiologsit-  dr [name]  . Iron deficiency anemia   . Mixed hyperlipidemia   . Neuromuscular disorder ([name])    restless lrg  . OA (osteoarthritis)    right hip  . OSA on CPAP followed by dr dohmeier   per study's 02-08-2005 & 10-29-2008  severe osa uses cpap  . Prostate cancer [facility]) urologist-  dr budzyn/  oncologist-  dr Tammi Klippel   dx 03-17-2017 (bx)  Stage T2a, Gleason 4+4, PSA  4.43, vol  32.32--  scheduled for radiatctive prostate seed implants 07-10-2017 then IMRT and ADT  . RLS (restless legs syndrome)   . Scoliosis   . Trigger finger   . Type 2 diabetes mellitus ([place])   . Wears partial dentures    upper and lower    Past Surgical History:  Procedure Laterality Date  . APPENDECTOMY  1988  . BILATERAL TOTAL ETHMOIDECTOMY AND SPHENOIDECTOMY  01-14-2009    dr bates   [facility]  . CARPAL TUNNEL RELEASE Bilateral 2013  . CATARACT EXTRACTION W/ INTRAOCULAR LENS  IMPLANT, BILATERAL  date?  . CYSTOSCOPY  07/19/2017   Procedure: CYSTOSCOPY;  Surgeon: Nickie Retort, MD;  Location: [facility];  Service: Urology;;  No seeds found in [place]  . LAMINECTOMY WITH POSTERIOR LATERAL ARTHRODESIS LEVEL 2 N/A 02/12/2014   Procedure: LUMBAR TWO-THREE,LUIMBAR THREE-FOUR LAMINECTOMY/FORAMINOTOMY;POSSIBLE POSTEROLATERAL ARTHRODESIS WITH AUTOGRAFT;  Surgeon: [name], MD;  Location: [facility];  Service: Neurosurgery;  Laterality: N/A;  . ORIF RIGHT ANKLE FX'S  05/05/2001   retained hardware  . PROSTATE BIOPSY  03-17-2017   dr Pilar Jarvis office  . RADIOACTIVE SEED IMPLANT N/A 07/19/2017   Procedure: RADIOACTIVE SEED IMPLANT/BRACHYTHERAPY IMPLANT;  Surgeon: Nickie Retort, MD;  Location: [facility];  Service: Urology;  Laterality: N/A;  69 seeds implanted  . SPACE OAR INSTILLATION N/A 07/19/2017   Procedure: SPACE OAR INSTILLATION;  Surgeon: Nickie Retort, MD;  Location: [place];  Service: Urology;  Laterality: N/A;  . TEE WITHOUT CARDIOVERSION  03/20/2012   Procedure: TRANSESOPHAGEAL ECHOCARDIOGRAM (TEE);  Surgeon: Laverda Page, MD;  Location: [facility] END[facility]COPY;  Service: Cardiovascular;  Laterality: N/A;  normal LV; normal EF; normal RV; normal LA w/ left atrial appendage very small, normal function, interatrial septum intact without defect; normal RA; trace MR,TR, & PI; mild AV calcification and senile degeneration w/ mild  stenosis, AVA 1.7cm^2;;   . TOTAL HIP ARTHROPLASTY Right 10/23/2017   Procedure: TOTAL HIP ARTHROPLASTY ANTERIOR APPROACH;  Surgeon: Frederik Pear, MD;  Location: [place];  Service: Orthopedics;  Laterality: Right;  . TRANSTHORACIC ECHOCARDIOGRAM  01-11-2017   dr [name]  (per echo note, no significant change in seveity of AS, no other diagnostic change)   moderate concentric LVH, ef 41%, grade 1 diastolic dysfunction/  moderate LAE/  mild , grade 1 AR w/ moderate AV calcification, mild to moderate restricted AV leaflets w/ moderate AS, AVA 1.16cm^2, peak grandient 49mHg, mean grandient 319mg/  trace MR, mild calcification MV annulus , mild MV leaflet calcification, mild MVS, peak grandient 4.44m73m, mean grandient 2.7mm29m trace TR    Family History  Problem Relation Age of Onset  . Stroke Mother   . Hypertension Mother   . Heart attack Father   . Heart murmur Father   . Hypertension Sister    Social History:  reports that he quit smoking about 51 years ago. His smoking use included cigarettes. He quit after 1.00 year of use. He has never used smokeless tobacco. He reports that he drinks alcohol. He reports that he does not use drugs.  Allergies: No Known Allergies Review of Systems  Constitutional: Positive for chills, fever and malaise/fatigue. Negative for diaphoresis and weight loss.  HENT: Negative.   Eyes: Negative.   Respiratory: Positive for cough. Negative for sputum production, shortness of breath and wheezing.   Cardiovascular: Positive for chest pain. Negative for palpitations, orthopnea, claudication, leg swelling and PND.  Gastrointestinal: Negative.   Genitourinary: Negative.   Musculoskeletal: Negative.   Skin: Negative.   Neurological: Negative.   Endo/Heme/Allergies: Negative.   Psychiatric/Behavioral: Negative.   All other systems reviewed and are negative.   Blood pressure (!) 146/79, pulse 86, temperature 100.3 F (37.9 C), temperature source Oral, resp. rate 15,  height 5' 9"  (1.753 m), weight 97.3 kg (214 lb 8.1 oz), SpO2 93 %. Body mass index is 31.68 kg/m. Body mass index is 31.68 kg/m.  Physical Exam  Constitutional: He is oriented to person, place, and time. He appears well-developed and well-nourished. No distress (pain. Mildly obese. Trunkal obesity present).  HENT:  Head: Atraumatic.  Eyes: Conjunctivae are normal.  Neck: Normal range of motion. Neck supple.  Cardiovascular: Intact distal pulses. Exam reveals no gallop.  S1 is normal, S2 is muffled, 3/6 crescendo decrescendo midsystolic murmur in the aortic area in the precordium.  Conducted to the carotid.  Pulmonary/Chest: Effort normal and breath sounds normal.  Abdominal: Soft. Bowel sounds are normal.  Obese  Musculoskeletal: Normal range of motion. He exhibits no edema or tenderness.  Neurological: He is alert and oriented to person, place, and time.  Skin: Skin is warm and dry.  Psychiatric: He has a normal mood and affect.    Results for orders placed or performed during the hospital encounter of 03/01/18 (from the past 48 hour(s))  CBC with Differential/Platelet     Status: Abnormal   Collection Time: 03/01/18  5:14 AM  Result  Value Ref Range   WBC 10.2 4.0 - 10.5 K/uL   RBC 3.43 (L) 4.22 - 5.81 MIL/uL   Hemoglobin 9.5 (L) 13.0 - 17.0 g/dL   HCT 30.5 (L) 39.0 - 52.0 %   MCV 88.9 78.0 - 100.0 fL   MCH 27.7 26.0 - 34.0 pg   MCHC 31.1 30.0 - 36.0 g/dL   RDW 14.8 11.5 - 15.5 %   Platelets 206 150 - 400 K/uL   Neutrophils Relative % 87 %   Neutro Abs 8.9 (H) 1.7 - 7.7 K/uL   Lymphocytes Relative 4 %   Lymphs Abs 0.4 (L) 0.7 - 4.0 K/uL   Monocytes Relative 8 %   Monocytes Absolute 0.8 0.1 - 1.0 K/uL   Eosinophils Relative 1 %   Eosinophils Absolute 0.1 0.0 - 0.7 K/uL   Basophils Relative 0 %   Basophils Absolute 0.0 0.0 - 0.1 K/uL   Immature Granulocytes 0 %   Abs Immature Granulocytes 0.0 0.0 - 0.1 K/uL    Comment: Performed at [facility], [address] [place], [place] [address]  Protime-INR     Status: None   Collection Time: 03/01/18  5:14 AM  Result Value Ref Range   Prothrombin Time 14.9 11.4 - 15.2 seconds   INR 1.18     Comment: Performed at [facility] [address] [place], [place] [address]  APTT     Status: None   Collection Time: 03/01/18  5:14 AM  Result Value Ref Range   aPTT 31 24 - 36 seconds    Comment: Performed at [facility] [address], [place], [place] [address]  Comprehensive metabolic panel     Status: Abnormal   Collection Time: 03/01/18  5:14 AM  Result Value Ref Range   Sodium 138 135 - 145 mmol/L   Potassium 3.6 3.5 - 5.1 mmol/L   Chloride 103 98 - 111 mmol/L   CO2 24 22 - 32 mmol/L   Glucose, Bld 172 (H) 70 - 99 mg/dL   BUN 10 8 - 23 mg/dL   Creatinine, Ser 0.93 0.61 - 1.24 mg/dL   Calcium 9.0 8.9 - 10.3 mg/dL   Total Protein 6.3 (L) 6.5 - 8.1 g/dL   Albumin 3.5 3.5 - 5.0 g/dL   AST 90 (H) 15 - 41 U/L   ALT 32 0 - 44 U/L   Alkaline Phosphatase 34 (L) 38 - 126 U/L   Total Bilirubin 1.1 0.3 - 1.2 mg/dL   GFR calc non Af Amer >60 >60 mL/min   GFR calc Af Amer >60 >60 mL/min    Comment: (NOTE) The eGFR has been calculated using the CKD EPI equation. This calculation has not been validated in all clinical situations. eGFR's persistently <60 mL/min signify possible Chronic Kidney Disease.    Anion gap 11 5 - 15    Comment: Performed at [facility] [address] [place], [place] [address]  Troponin I     Status: Abnormal   Collection Time: 03/01/18  5:14 AM  Result Value Ref Range   Troponin I 24.93 (HH) <0.03 ng/mL    Comment: CRITICAL RESULT CALLED TO, READ BACK BY AND VERIFIED WITH: HARRIS A,RN 03/01/18 0626 WAYK Performed at [facility] [address], [place], [place] [address]   D-dimer, quantitative (not at [facility])     Status: Abnormal   Collection Time: 03/01/18  5:14 AM  Result Value Ref Range  D-Dimer, Quant 3.49 (H) 0.00 - 0.50 ug/mL-FEU     Comment: (NOTE) At the manufacturer cut-off of 0.50 ug/mL FEU, this assay has been documented to exclude PE with a sensitivity and negative predictive value of 97 to 99%.  At this time, this assay has not been approved by the FDA to exclude DVT/VTE. Results should be correlated with clinical presentation. Performed at [facility], [place] [address] [place], [place] [address]   Lipid panel     Status: None   Collection Time: 03/01/18  5:14 AM  Result Value Ref Range   Cholesterol 151 0 - 200 mg/dL   Triglycerides 69 <150 mg/dL   HDL 55 >40 mg/dL   Total CHOL/HDL Ratio 2.7 RATIO   VLDL 14 0 - 40 mg/dL   LDL Cholesterol 82 0 - 99 mg/dL    Comment:        Total Cholesterol/HDL:CHD Risk Coronary Heart Disease Risk Table                     Men   Women  1/2 Average Risk   3.4   3.3  Average Risk       5.0   4.4  2 X Average Risk   9.6   7.1  3 X Average Risk  23.4   11.0        Use the calculated Patient Ratio above and the CHD Risk Table to determine the patient's CHD Risk.        ATP III CLASSIFICATION (LDL):  <100     mg/dL   Optimal  100-129  mg/dL   Near or Above                    Optimal  130-159  mg/dL   Borderline  160-189  mg/dL   High  >190     mg/dL   Very High Performed at [facility] [address] [place], [place] [address]     Labs:   Lab Results  Component Value Date   WBC 10.2 03/01/2018   HGB 9.5 (L) 03/01/2018   HCT 30.5 (L) 03/01/2018   MCV 88.9 03/01/2018   PLT 206 03/01/2018    Recent Labs  Lab 03/01/18 0514  NA 138  K 3.6  CL 103  CO2 24  BUN 10  CREATININE 0.93  CALCIUM 9.0  PROT 6.3*  BILITOT 1.1  ALKPHOS 34*  ALT 32  AST 90*  GLUCOSE 172*    Lipid Panel     Component Value Date/Time   CHOL 151 03/01/2018 0514   TRIG 69 03/01/2018 0514   HDL 55 03/01/2018 0514   CHOLHDL 2.7 03/01/2018 0514   VLDL 14 03/01/2018 0514   LDLCALC 82 03/01/2018 0514    BNP (last 3 results) No results for input(s): BNP in the  last 8760 hours.  HEMOGLOBIN A1C Lab Results  Component Value Date   HGBA1C 5.7 (H) 10/23/2017   MPG 116.89 10/23/2017    Cardiac Panel (last 3 results) Recent Labs    03/01/18 0514  TROPONINI 24.93*    Lab Results  Component Value Date   TROPONINI 24.93 ([place]) 03/01/2018     TSH No results for input(s): TSH in the last 8760 hours.   Medications Prior to Admission  Medication Sig Dispense Refill  . Amlodipine-Valsartan-HCTZ 10-160-12.5 MG TABS Take 1 tablet by mouth every morning.     . Ascorbic Acid (VITAMIN C) 1000 MG  tablet Take 1,000 mg by mouth 2 (two) times a week.     Marland Kitchen aspirin EC 81 MG tablet Take 324 mg by mouth once.     . colesevelam (WELCHOL) 625 MG tablet Take 1,875 mg by mouth daily.     Marland Kitchen donepezil (ARICEPT) 10 MG tablet Take 10 mg by mouth every morning.     Marland Kitchen esomeprazole (NEXIUM) 40 MG capsule Take 40 mg by mouth daily as needed (acid reflux).     Marland Kitchen ezetimibe-simvastatin (VYTORIN) 10-80 MG per tablet Take 1 tablet by mouth every morning.     . ferrous sulfate 325 (65 FE) MG EC tablet Take 325 mg by mouth 2 (two) times a week.     . fluticasone (FLONASE) 50 MCG/ACT nasal spray Place 1 spray into both nostrils daily as needed for allergies or rhinitis.    Marland Kitchen insulin glargine (LANTUS SOLOSTAR) 100 UNIT/ML injection Inject 20-40 Units into the skin at bedtime as needed (sugar levels).     . nabumetone (RELAFEN) 750 MG tablet Take 750 mg by mouth 2 (two) times daily as needed for mild pain.    . naproxen sodium (ALEVE) 220 MG tablet Take 220 mg by mouth daily as needed (pain/fever).    Marland Kitchen omega-3 acid ethyl esters (LOVAZA) 1 G capsule Take 2 g by mouth daily.     Marland Kitchen oxyCODONE-acetaminophen (PERCOCET/ROXICET) 5-325 MG tablet Take 1 tablet by mouth every 4 (four) hours as needed for severe pain. 30 tablet 0  . pseudoephedrine (SUDAFED) 30 MG tablet Take 30 mg by mouth every 4 (four) hours as needed for congestion.    Marland Kitchen rOPINIRole (REQUIP) 2 MG tablet Take 1-2 mg by  mouth See admin instructions. Take 1 mg if restless leg isn't very bad. Take 2 mg daily for restless leg.    . tamsulosin (FLOMAX) 0.4 MG CAPS capsule Take 0.4 mg by mouth daily after supper.     Marland Kitchen tiZANidine (ZANAFLEX) 2 MG tablet Take 1 tablet (2 mg total) by mouth every 6 (six) hours as needed. (Patient taking differently: Take 2 mg by mouth every 6 (six) hours as needed for muscle spasms. ) 60 tablet 0  . aspirin EC 325 MG tablet Take 1 tablet (325 mg total) by mouth 2 (two) times daily. (Patient not taking: Reported on 03/01/2018) 30 tablet 0      Current Facility-Administered Medications:  .  0.9 %  sodium chloride infusion, , Intravenous, Continuous, Rancour, Stephen, MD, Last Rate: 10 mL/hr at 03/01/18 0651 .  acetaminophen (TYLENOL) tablet 650 mg, 650 mg, Oral, Q4H PRN, Adrian Prows, MD .  Derrill Memo ON 03/02/2018] aspirin EC tablet 81 mg, 81 mg, Oral, Daily, Adrian Prows, MD .  heparin 5000 UNIT/ML injection, , , ,  .  ondansetron (ZOFRAN) injection 4 mg, 4 mg, Intravenous, Q6H PRN, Adrian Prows, MD  CARDIAC STUDIES:  EKG 03/01/2018: Normal sinus rhythm, ST elevation with T wave inversion noted in V1 to V4 with Q waves, suggestive of acute injury pattern and evolving anteroseptal MI.  LVH.  ECHO pending  Assessment/Plan 1.  Acute anteroseptal MI with ongoing chest pain suggestive of both coronary ischemia and pleuritic chest pain. 2.  Possible pneumonia, patient had temperature of 101 F last night associated with chills. 3.  Obesity with severe obstructive sleep apnea on CPAP 4.  Diabetes mellitus type 2 controlled without hyperglycemia and without complications. 5.  Moderate aortic stenosis.  Recommendation: Patient will be emergently taken to the cardiac catheterization lab to  evaluate his coronary anatomy.  He will need chest x-ray to exclude pneumonia.  Patient has received IV antibiotics, continue the same for now.  Further recommendations to follow.  Adrian Prows, MD 03/01/2018, 7:06  AM [facility]. [place] Pager: [phone] Office: [phone] If no answer: Cell:  [phone]

## 2018-03-01 NOTE — Progress Notes (Signed)
Patient requesting Requip dose for bedtime, verified with patient and spouse that ordered dose dose not match his prescribed home dose. Paged Dr [name], new order received for Requip dose.

## 2018-03-01 NOTE — Progress Notes (Signed)
Chaplain responded to STEMI, meeting wife outside room; daughter and son-in-law arrived shortly.  Provided hospitality, emotional support and orientation to process.  Escorted family to [facility] waiting room.  Patient is former Company secretary, "tough", but was the one who asked wife to call 911. Wife became teary after saying goodbye to patient before procedure.    Please contact for support as needed.  Luana Shu [phone]    03/01/18 0500  Clinical Encounter Type  Visited With Family  Visit Type Initial;Critical Care;Psychological support  Referral From Nurse  Consult/Referral To Chaplain  Stress Factors  Patient Stress Factors Health changes  Family Stress Factors Lack of knowledge

## 2018-03-01 NOTE — Progress Notes (Signed)
Initial Nutrition Assessment  DOCUMENTATION CODES:   Obesity unspecified  INTERVENTION:   -Pt denied oral nutrition supplements or snacks between meals at this time. If po intake does not improve, will readdress on follow up   NUTRITION DIAGNOSIS:   Inadequate oral intake related to poor appetite, acute illness as evidenced by meal completion < 50%, per patient/family report.  GOAL:   Patient will meet greater than or equal to 90% of their needs  MONITOR:   PO intake, Supplement acceptance, Labs, Weight trends  REASON FOR ASSESSMENT:   Malnutrition Screening Tool    ASSESSMENT:    77 yo male admitted with acute MI with possible pneumonia. Pt with hx of DM, obesity, HTN, GERD, diverticulosis   Recorded po intake 10% at breakfast this AM.  Pt did not eat dinner last night, ate tomato sandwich around 2pm yesterday. Pt reports poor appetite at present and for several days PTA. Prior to this, pt eating well.   Pt denies weight loss. Per weight encounters, weight has been stable.   Labs: reviewed Meds: lovaza, lantus, vit C   Diet Order:   Diet Order           Diet Carb Modified Fluid consistency: Thin; Room service appropriate? Yes  Diet effective now          EDUCATION NEEDS:   Not appropriate for education at this time  Skin:  Skin Assessment: Reviewed RN Assessment  Last BM:  7/29  Height:   Ht Readings from Last 1 Encounters:  03/01/18 5\' 9"  (1.753 m)    Weight:   Wt Readings from Last 1 Encounters:  03/01/18 214 lb 8.1 oz (97.3 kg)    Ideal Body Weight:     BMI:  Body mass index is 31.68 kg/m.  Estimated Nutritional Needs:   Kcal:  1800-2000 kcals  Protein:  95-105 g  Fluid:  >/= 1.8 L   Kerman Passey MS, RD, LDN, CNSC [phone] Pager  [phone] Weekend/On-Call Pager

## 2018-03-01 NOTE — Progress Notes (Addendum)
ANTICOAGULATION CONSULT NOTE - Initial Consult  Pharmacy Consult for heparin Indication: atrial fibrillation  No Known Allergies  Patient Measurements: Height: 5\' 9"  (175.3 cm) Weight: 214 lb 8.1 oz (97.3 kg) IBW/kg (Calculated) : 70.7 Heparin Dosing Weight: 91.1  Vital Signs: Temp: 100 F (37.8 C) (08/01 1200) Temp Source: Oral (08/01 1200) BP: 101/41 (08/01 1300) Pulse Rate: 66 (08/01 1300)  Labs: Recent Labs    03/01/18 0514 03/01/18 0738  HGB 9.5* 10.0*  HCT 30.5* 31.5*  PLT 206 189  APTT 31  --   LABPROT 14.9  --   INR 1.18  --   CREATININE 0.93 0.83  TROPONINI 24.93* 22.43*    Estimated Creatinine Clearance: 88.4 mL/min (by C-G formula based on SCr of 0.83 mg/dL).   Medical History: Past Medical History:  Diagnosis Date  . Aortic valve stenosis, moderate cardiologist-- ganji   last echo 01-11-2017 by dr [name]-- moderate AV leaflet restricted w/  moderate AV stenosis, AVA 1.16cm^2  . Benign localized prostatic hyperplasia with lower urinary tract symptoms (LUTS)   . Carotid stenosis, left followed by dr [name], cardiologist--- bilateral bruit per note   per dr [name] note 32/9924  LICA 26-83% stenosis per duplex 05-14-2014  . Diverticulosis   . ED (erectile dysfunction)   . First degree heart block   . GERD (gastroesophageal reflux disease)   . Heart murmur   . History of pneumonia 08-22-2016 & 09-01-2016  . Hypertension    cardiologsit-  dr [name]  . Iron deficiency anemia   . Mixed hyperlipidemia   . Neuromuscular disorder ([name])    restless lrg  . OA (osteoarthritis)    right hip  . OSA on CPAP followed by dr dohmeier   per study's 02-08-2005 & 10-29-2008  severe osa uses cpap  . Prostate cancer [facility]) urologist-  dr budzyn/  oncologist-  dr Tammi Klippel   dx 03-17-2017 (bx)  Stage T2a, Gleason 4+4, PSA  4.43, vol 32.32--  scheduled for radiatctive prostate seed implants 07-10-2017 then IMRT and ADT  . RLS (restless legs syndrome)   . Scoliosis   .  Trigger finger   . Type 2 diabetes mellitus ([place])   . Wears partial dentures    upper and lower    Medications:  Scheduled:  . aspirin  81 mg Oral Daily  . colesevelam  1,875 mg Oral Daily  . donepezil  10 mg Oral q morning - 10a  . ezetimibe-simvastatin  1 tablet Oral q morning - 10a  . ferrous sulfate  325 mg Oral Once per day on Mon Thu  . furosemide  40 mg Intravenous Once  . heparin      . heparin  5,000 Units Subcutaneous Q8H  . insulin glargine  20 Units Subcutaneous QHS  . mouth rinse  15 mL Mouth Rinse BID  . metoprolol tartrate  25 mg Oral BID  . omega-3 acid ethyl esters  2 g Oral Daily  . pantoprazole  40 mg Oral Daily  . rOPINIRole  1-2 mg Oral Daily  . sodium chloride flush  3 mL Intravenous Q12H  . tamsulosin  0.4 mg Oral QPC supper  . ticagrelor  90 mg Oral BID  . vitamin C  1,000 mg Oral Once per day on Mon Thu   Infusions:  . sodium chloride Stopped (03/01/18 0832)  . sodium chloride    . sodium chloride 1 mL/kg/hr (03/01/18 0832)   PRN: sodium chloride, acetaminophen, ALPRAZolam, fluticasone, nabumetone, ondansetron (ZOFRAN) IV, oxyCODONE-acetaminophen, sodium chloride  flush, tiZANidine  Assessment: 77 yo M with new onset afib. Patient has a history of AV stenosis, 1st degree heart block, GERD, HTN, anemia, OSA, diabetes, and prostate cancer. Pharmacy was consulted to begin heparin therapy for afib.  Baseline aPTT = 31, INR = 1.18, Hgb = 10   Goal of Therapy:  Heparin level 0.3-0.7 units/ml Monitor platelets by anticoagulation protocol: Yes   Plan:  Will not give heparin bolus with low hemoglobin Start heparin infusion at 1,300 units/hr Check anti-Xa level in 8 hours and daily while on heparin Continue to monitor H&H and platelets  Will discontinue [place] heparin   Vertis Kelch, PharmD PGY1 Pharmacy Resident Phone [phone] 03/01/2018       1:24 PM

## 2018-03-01 NOTE — ED Triage Notes (Signed)
Patient BIB [facility] for intermittent chest pain since yesterday morning. Patient reports non radiating central chest pain, 9/10 sharpe shooting pain. SOB, productive cough, weakness, temp at home 1001  No N/V  324 mg aspirin  1 NTG

## 2018-03-01 NOTE — Progress Notes (Signed)
Patient SOB, sweaty and has temperature of 101.  O2 saturation on 2L O2 is 97%, temperature down to 100 after 650 mg Tylenol given.  New fine crackles in bilateral lower bases, irregular pulse present; afib confirmed via EKG.  Dr. [name] notified and new orders received for IV heparin per Pharmacy and 40 IV lasix for one dose.  Mc[place]arlan, Ardeth Sportsman

## 2018-03-01 NOTE — ED Provider Notes (Signed)
[facility] EMERGENCY DEPARTMENT Provider Note   CSN: [id number] Arrival date & time: 03/01/18  0503     History   Chief Complaint Chief Complaint  Patient presents with  . Chest Pain    HPI Cody Phelps is a 77 y.o. male.  Level 5 caveat for acuity of condition.  Patient brought in by EMS with left-sided chest pain and shortness of breath that has been persistent since yesterday morning at 9 AM.  Reports the pain comes and goes but has become more severe this evening.  Pain is in the left side of his chest and does not radiate.  It is worse with deep breathing.  Is associated with some shortness of breath and cough.  Did have fever to 101 yesterday.  He has generalized weakness as well.  Denies any history of COPD or asthma.  No CAD history.  Did have car trip to the beach last week.  EKG by EMS showed anterior ST elevation concerning for STEMI. Dr. [name] his cardiologist was notified and is at bedside shortly after arrival.  The history is provided by the patient and the EMS personnel. The history is limited by the condition of the patient.  Chest Pain   Associated symptoms include a fever, shortness of breath and weakness. Pertinent negatives include no abdominal pain, no dizziness and no headaches.    Past Medical History:  Diagnosis Date  . Aortic valve stenosis, moderate cardiologist-- ganji   last echo 01-11-2017 by dr [name]-- moderate AV leaflet restricted w/  moderate AV stenosis, AVA 1.16cm^2  . Benign localized prostatic hyperplasia with lower urinary tract symptoms (LUTS)   . Carotid stenosis, left followed by dr [name], cardiologist--- bilateral bruit per note   per dr [name] note [id number]/6283  LICA 15-17% stenosis per duplex 05-14-2014  . Diverticulosis   . ED (erectile dysfunction)   . First degree heart block   . GERD (gastroesophageal reflux disease)   . Heart murmur   . History of pneumonia 08-22-2016 & 09-01-2016  . Hypertension    cardiologsit-   dr [name]  . Iron deficiency anemia   . Mixed hyperlipidemia   . Neuromuscular disorder ([name])    restless lrg  . OA (osteoarthritis)    right hip  . OSA on CPAP followed by dr dohmeier   per study's 02-08-2005 & 10-29-2008  severe osa uses cpap  . Prostate cancer [facility]) urologist-  dr budzyn/  oncologist-  dr Tammi Klippel   dx 03-17-2017 (bx)  Stage T2a, Gleason 4+4, PSA  4.43, vol 32.32--  scheduled for radiatctive prostate seed implants 07-10-2017 then IMRT and ADT  . RLS (restless legs syndrome)   . Scoliosis   . Trigger finger   . Type 2 diabetes mellitus ([place])   . Wears partial dentures    upper and lower    Patient Active Problem List   Diagnosis Date Noted  . Primary osteoarthritis of right hip 10/23/2017  . Osteoarthritis of right hip 10/18/2017  . Malignant neoplasm of prostate ([place]) 04/21/2017  . RLS (restless legs syndrome) 11/20/2014  . Lumbar stenosis with neurogenic claudication 02/12/2014  . Obstructive sleep apnea 11/25/2013  . DM (diabetes mellitus) type 2, uncontrolled, with ketoacidosis ([place]) 11/25/2013  . HTN (hypertension) 11/25/2013  . Bronchitis, acute 05/04/2012  . Lymphadenopathy 05/04/2012  . OTHER CHRONIC SINUSITIS 08/08/2008  . RHINITIS 08/05/2008  . SLEEP APNEA 08/05/2008  . FOOT PAIN, RIGHT 06/10/2008  . DIABETES MELLITUS, TYPE II 08/31/2006  . HYPERTENSION  08/31/2006  . DIVERTICULOSIS, COLON 08/31/2006  . LEG CRAMPS 08/31/2006    Past Surgical History:  Procedure Laterality Date  . APPENDECTOMY  1988  . BILATERAL TOTAL ETHMOIDECTOMY AND SPHENOIDECTOMY  01-14-2009    dr bates   [facility]  . CARPAL TUNNEL RELEASE Bilateral 2013  . CATARACT EXTRACTION W/ INTRAOCULAR LENS  IMPLANT, BILATERAL  date?  . CYSTOSCOPY  07/19/2017   Procedure: CYSTOSCOPY;  Surgeon: Nickie Retort, MD;  Location: [facility];  Service: Urology;;  No seeds found in [place]  . LAMINECTOMY WITH POSTERIOR LATERAL ARTHRODESIS LEVEL 2 N/A 02/12/2014   Procedure:  LUMBAR TWO-THREE,LUIMBAR THREE-FOUR LAMINECTOMY/FORAMINOTOMY;POSSIBLE POSTEROLATERAL ARTHRODESIS WITH AUTOGRAFT;  Surgeon: [name], MD;  Location: [facility]UR[facility];  Service: Neurosurgery;  Laterality: N/A;  . ORIF RIGHT ANKLE FX'S  05/05/2001   retained hardware  . PROSTATE BIOPSY  03-17-2017   dr Pilar Jarvis office  . RADIOACTIVE SEED IMPLANT N/A 07/19/2017   Procedure: RADIOACTIVE SEED IMPLANT/BRACHYTHERAPY IMPLANT;  Surgeon: Nickie Retort, MD;  [facility];  Service: Urology;  Laterality: N/A;  69 seeds implanted  . SPACE OAR INSTILLATION N/A 07/19/2017   Procedure: SPACE OAR INSTILLATION;  Surgeon: Nickie Retort, MD;  Location: [facility];  Service: Urology;  Laterality: N/A;  . TEE WITHOUT CARDIOVERSION  03/20/2012   Procedure: TRANSESOPHAGEAL ECHOCARDIOGRAM (TEE);  Surgeon: Laverda Page, MD;  Location[facility] ENDOSCOPY;  Service: Cardiovascular;  Laterality: N/A;  normal LV; normal EF; normal RV; normal LA w/ left atrial appendage very small, normal function, interatrial septum intact without defect; normal RA; trace MR,TR, & PI; mild AV calcification and senile degeneration w/ mild stenosis, AVA 1.7cm^2;;   . TOTAL HIP ARTHROPLASTY Right 10/23/2017   Procedure: TOTAL HIP ARTHROPLASTY ANTERIOR APPROACH;  Surgeon: Frederik Pear, MD;  Location: [place];  Service: Orthopedics;  Laterality: Right;  . TRANSTHORACIC ECHOCARDIOGRAM  01-11-2017   dr [name]  (per echo note, no significant change in seveity of AS, no other diagnostic change)   moderate concentric LVH, ef 42%, grade 1 diastolic dysfunction/  moderate LAE/  mild , grade 1 AR w/ moderate AV calcification, mild to moderate restricted AV leaflets w/ moderate AS, AVA 1.16cm^2, peak grandient 22mHg, mean grandient 360mg/  trace MR, mild calcification MV annulus , mild MV leaflet calcification, mild MVS, peak grandient 4.40m340m, mean grandient 2.7mm70m trace TR        Home Medications     Prior to Admission medications   Medication Sig Start Date End Date Taking? Authorizing Provider  Amlodipine-Valsartan-HCTZ 10-160-12.5 MG TABS Take 1 tablet by mouth every morning.  11/07/13   [provider]  Ascorbic Acid (VITAMIN C) 1000 MG tablet Take 1,000 mg by mouth 2 (two) times a week.     [provider]  aspirin EC 325 MG tablet Take 1 tablet (325 mg total) by mouth 2 (two) times daily. 10/23/17   PhilLeighton Parody-C  colesevelam ([facility]5 MG tablet Take 1,875 mg by mouth daily.     [provider]  donepezil (ARICEPT) 10 MG tablet Take 10 mg by mouth every morning.  03/19/12   [provider]  esomeprazole (NEXIUM) 40 MG capsule Take 40 mg by mouth daily as needed (acid reflux).     [provider]  ezetimibe-simvastatin (VYTORIN) 10-80 MG per tablet Take 1 tablet by mouth every morning.     [provider]  ferrous sulfate 325 (65 FE) MG EC tablet Take 325  mg by mouth 2 (two) times a week.     [provider]  insulin glargine (LANTUS SOLOSTAR) 100 UNIT/ML injection Inject 40 Units into the skin at bedtime.     [provider]  omega-3 acid ethyl esters (LOVAZA) 1 G capsule Take 2 g by mouth 2 (two) times daily.     [provider]  oxyCODONE-acetaminophen (PERCOCET/ROXICET) 5-325 MG tablet Take 1 tablet by mouth every 4 (four) hours as needed for severe pain. 10/23/17   Leighton Parody, PA-C  Pioglitazone HCl-Metformin HCl (ACTOPLUS MET XR) 30-1000 MG TB24 Take 1 tablet by mouth every morning.     [provider]  rOPINIRole (REQUIP) 2 MG tablet Take 1 mg by mouth 2 (two) times daily.    [provider]  tamsulosin (FLOMAX) 0.4 MG CAPS capsule Take 0.4 mg by mouth daily after supper.     [provider]  tiZANidine (ZANAFLEX) 2 MG tablet Take 1 tablet (2 mg total) by mouth every 6 (six) hours as needed. 10/23/17   Leighton Parody, PA-C    Family History Family History   Problem Relation Age of Onset  . Stroke Mother   . Hypertension Mother   . Heart attack Father   . Heart murmur Father   . Hypertension Sister     Social History Social History   Tobacco Use  . Smoking status: Former Smoker    Years: 1.00    Types: Cigarettes    Last attempt to quit: 02/04/1967    Years since quitting: 51.1  . Smokeless tobacco: Never Used  . Tobacco comment: smoked 1 q2-3 days  Substance Use Topics  . Alcohol use: Yes    Comment: occas.  . Drug use: No     Allergies   Patient has no known allergies.   Review of Systems Review of Systems  Constitutional: Positive for activity change, fatigue and fever.  Respiratory: Positive for chest tightness and shortness of breath.   Cardiovascular: Positive for chest pain.  Gastrointestinal: Negative for abdominal pain.  Genitourinary: Negative for dysuria and hematuria.  Musculoskeletal: Positive for arthralgias and myalgias.  Neurological: Positive for weakness. Negative for dizziness and headaches.    all other systems are negative except as noted in the HPI and PMH.    Physical Exam Updated Vital Signs BP (!) 119/59   Pulse 88   Temp 100.3 F (37.9 C) (Oral)   Resp (!) 22   Ht _0  (1.753 m)   Wt 99.3 kg (219 lb)   SpO2 98%   BMI 32.34 kg/m   Physical Exam  Constitutional: He is oriented to person, place, and time. He appears well-developed and well-nourished. No distress.  HENT:  Head: Normocephalic and atraumatic.  Mouth/Throat: Oropharynx is clear and moist. No oropharyngeal exudate.  Eyes: Pupils are equal, round, and reactive to light. Conjunctivae and EOM are normal.  Neck: Normal range of motion. Neck supple.  No meningismus.  Cardiovascular: Normal rate, regular rhythm and intact distal pulses.  Murmur heard. Holosystolic murmur across precordium  Pulmonary/Chest: Effort normal and breath sounds normal. No respiratory distress. He exhibits no tenderness.  Uncomfortable with  splinting respirations  Abdominal: Soft. There is no tenderness. There is no rebound and no guarding.  Musculoskeletal: Normal range of motion. He exhibits no edema or tenderness.  Equal radial pulses, DP pulses from PT pulses  Neurological: He is alert and oriented to person, place, and time. No cranial nerve deficit. He exhibits normal muscle  tone. Coordination normal.  No ataxia on finger to nose bilaterally. No pronator drift. 5/5 strength throughout. CN 2-12 intact.Equal grip strength. Sensation intact.   Skin: Skin is warm.  Psychiatric: He has a normal mood and affect. His behavior is normal.  Nursing note and vitals reviewed.    ED Treatments / Results  Labs (all labs ordered are listed, but only abnormal results are displayed) Labs Reviewed  CBC WITH DIFFERENTIAL/PLATELET - Abnormal; Notable for the following components:      Result Value   RBC 3.43 (*)    Hemoglobin 9.5 (*)    HCT 30.5 (*)    Neutro Abs 8.9 (*)    Lymphs Abs 0.4 (*)    All other components within normal limits  COMPREHENSIVE METABOLIC PANEL - Abnormal; Notable for the following components:   Glucose, Bld 172 (*)    Total Protein 6.3 (*)    AST 90 (*)    Alkaline Phosphatase 34 (*)    All other components within normal limits  TROPONIN I - Abnormal; Notable for the following components:   Troponin I 24.93 (*)    All other components within normal limits  D-DIMER, QUANTITATIVE (NOT AT [facility]) - Abnormal; Notable for the following components:   D-Dimer, Quant 3.49 (*)    All other components within normal limits  MRSA PCR SCREENING  PROTIME-INR  APTT  LIPID PANEL  TROPONIN I  TROPONIN I  TROPONIN I  HEMOGLOBIN A1C  CBC  CREATININE, SERUM    EKG EKG Interpretation  Date/Time:  Thursday March 01 2018 05:11:34 EDT Ventricular Rate:  89 PR Interval:    QRS Duration: 97 QT Interval:  359 QTC Calculation: 437 R Axis:   -42 Text Interpretation:  Sinus tachycardia Ventricular premature complex  Prolonged PR interval Inferior infarct, old Probable anterior infarct, age indeterminate Baseline wander in lead(s) V1 anterior ST elevation with Q waves  Confirmed by Ezequiel Essex [phone]) on 03/01/2018 5:16:42 AM   Radiology No results found.  Procedures Procedures (including critical care time)  Medications Ordered in ED Medications  0.9 %  sodium chloride infusion (has no administration in time range)  aspirin chewable tablet 324 mg (324 mg Oral Not Given 03/01/18 0514)  0.9 %  sodium chloride infusion (has no administration in time range)  heparin injection 4,000 Units (has no administration in time range)  heparin injection 4,000 Units (4,000 Units Intravenous Given 03/01/18 0510)     Initial Impression / Assessment and Plan / ED Course  I have reviewed the triage vital signs and the nursing notes.  Pertinent labs & imaging results that were available during my care of the patient were reviewed by me and considered in my medical decision making (see chart for details).    Intermittent pleuritic chest pain since yesterday with fever, cough and shortness of breath.  EKG concerning for anterior STEMI.  Patient given aspirin and heparin.  Dr. Janae Sauce EKG is concerning and will take patient to the Cath Lab.Dr. [name] at bedside on arrival.  There is also concern for possible pneumonia, pericarditis, pulmonary embolism.  Labs and chest x-ray pending at time of admission.  Patient was given IV heparin and aspirin.  Blood pressure and mental status remained stable and patient taken emergently to the Cath Lab.  CRITICAL CARE Performed by: Ezequiel Essex Total critical care time: 32 minutes Critical care time was exclusive of separately billable procedures and treating other patients. Critical care was necessary to treat or prevent imminent or  life-threatening deterioration. Critical care was time spent personally by me on the following activities: development of treatment plan  with patient and/or surrogate as well as nursing, discussions with consultants, evaluation of patient's response to treatment, examination of patient, obtaining history from patient or surrogate, ordering and performing treatments and interventions, ordering and review of laboratory studies, ordering and review of radiographic studies, pulse oximetry and re-evaluation of patient's condition.   Final Clinical Impressions(s) / ED Diagnoses   Final diagnoses:  ST elevation myocardial infarction (STEMI), unspecified artery [facility])    ED Discharge Orders    None       Ezequiel Essex, MD 03/01/18 [phone]

## 2018-03-02 ENCOUNTER — Inpatient Hospital Stay (HOSPITAL_COMMUNITY): Payer: Medicare Other

## 2018-03-02 ENCOUNTER — Encounter (HOSPITAL_COMMUNITY): Payer: Self-pay | Admitting: Cardiology

## 2018-03-02 LAB — URINALYSIS, ROUTINE W REFLEX MICROSCOPIC
Bilirubin Urine: NEGATIVE
Glucose, UA: NEGATIVE mg/dL
Hgb urine dipstick: NEGATIVE
Ketones, ur: 5 mg/dL — AB
Leukocytes, UA: NEGATIVE
Nitrite: NEGATIVE
Protein, ur: NEGATIVE mg/dL
Specific Gravity, Urine: 1.029 (ref 1.005–1.030)
pH: 5 (ref 5.0–8.0)

## 2018-03-02 LAB — HEPARIN LEVEL (UNFRACTIONATED)
Heparin Unfractionated: 0.17 IU/mL — ABNORMAL LOW (ref 0.30–0.70)
Heparin Unfractionated: 0.26 IU/mL — ABNORMAL LOW (ref 0.30–0.70)

## 2018-03-02 LAB — BASIC METABOLIC PANEL
Anion gap: 10 (ref 5–15)
BUN: 16 mg/dL (ref 8–23)
CO2: 23 mmol/L (ref 22–32)
Calcium: 8.3 mg/dL — ABNORMAL LOW (ref 8.9–10.3)
Chloride: 105 mmol/L (ref 98–111)
Creatinine, Ser: 0.97 mg/dL (ref 0.61–1.24)
GFR calc Af Amer: >60 mL/min (ref >60)
GFR calc non Af Amer: >60 mL/min (ref >60)
Glucose, Bld: 134 mg/dL — ABNORMAL HIGH (ref 70–99)
Potassium: 3 mmol/L — ABNORMAL LOW (ref 3.5–5.1)
Sodium: 138 mmol/L (ref 135–145)

## 2018-03-02 LAB — CBC
HCT: 26.2 % — ABNORMAL LOW (ref 39.0–52.0)
Hemoglobin: 8.4 g/dL — ABNORMAL LOW (ref 13.0–17.0)
MCH: 27.9 pg (ref 26.0–34.0)
MCHC: 32.1 g/dL (ref 30.0–36.0)
MCV: 87 fL (ref 78.0–100.0)
Platelets: 161 10*3/uL (ref 150–400)
RBC: 3.01 MIL/uL — ABNORMAL LOW (ref 4.22–5.81)
RDW: 15.2 % (ref 11.5–15.5)
WBC: 9.6 10*3/uL (ref 4.0–10.5)

## 2018-03-02 LAB — ECHOCARDIOGRAM COMPLETE
Height: 69 in
Weight: 3432.12 oz

## 2018-03-02 LAB — GLUCOSE, CAPILLARY: Glucose-Capillary: 109 mg/dL — ABNORMAL HIGH (ref 70–99)

## 2018-03-02 MED ORDER — FUROSEMIDE 10 MG/ML IJ SOLN
40.0000 mg | Freq: Once | INTRAMUSCULAR | Status: AC
  Administered 2018-03-02: 40 mg via INTRAVENOUS
  Filled 2018-03-02: qty 4

## 2018-03-02 MED ORDER — FENTANYL CITRATE (PF) 100 MCG/2ML IJ SOLN
25.0000 ug | INTRAMUSCULAR | Status: DC | PRN
  Administered 2018-03-02 (×2): 25 ug via INTRAVENOUS
  Filled 2018-03-02: qty 2

## 2018-03-02 MED ORDER — AMOXICILLIN-POT CLAVULANATE 500-125 MG PO TABS
1.0000 | ORAL_TABLET | Freq: Three times a day (TID) | ORAL | Status: DC
  Administered 2018-03-02 – 2018-03-04 (×6): 500 mg via ORAL
  Filled 2018-03-02 (×7): qty 1

## 2018-03-02 MED ORDER — LOPERAMIDE HCL 2 MG PO CAPS
2.0000 mg | ORAL_CAPSULE | Freq: Three times a day (TID) | ORAL | Status: DC | PRN
  Administered 2018-03-02: 2 mg via ORAL
  Filled 2018-03-02: qty 1

## 2018-03-02 MED ORDER — ATORVASTATIN CALCIUM 80 MG PO TABS
80.0000 mg | ORAL_TABLET | Freq: Every day | ORAL | Status: DC
  Administered 2018-03-02 – 2018-03-03 (×2): 80 mg via ORAL
  Filled 2018-03-02 (×2): qty 1

## 2018-03-02 MED ORDER — POTASSIUM CHLORIDE ER 10 MEQ PO TBCR
20.0000 meq | EXTENDED_RELEASE_TABLET | ORAL | Status: AC
  Administered 2018-03-02 (×4): 20 meq via ORAL
  Filled 2018-03-02 (×8): qty 2

## 2018-03-02 MED ORDER — EZETIMIBE 10 MG PO TABS
10.0000 mg | ORAL_TABLET | Freq: Every day | ORAL | Status: DC
  Administered 2018-03-02 – 2018-03-04 (×3): 10 mg via ORAL
  Filled 2018-03-02 (×3): qty 1

## 2018-03-02 MED FILL — Nitroglycerin IV Soln 100 MCG/ML in D5W: INTRA_ARTERIAL | Qty: 10 | Status: AC

## 2018-03-02 NOTE — Care Management Note (Signed)
Case Management Note  Patient Details  Name: DEMARQUES PILZ MRN: [id number] Date of Birth: 1940-12-20  Subjective/Objective:    Pt admitted with MI                Action/Plan:    PTA independent from home with wife.  Pt has PCP and denied barriers with obtaining/paying for medications.  Pt will discharge home on Brilinta - CM provided free 30 day card and submitted benefit check.   Expected Discharge Date:                  Expected Discharge Plan:  Home/Self Care  In-House Referral:     Discharge planning Services  CM Consult  Post Acute Care Choice:    Choice offered to:     DME Arranged:    DME Agency:     HH Arranged:    HH Agency:     Status of Service:     If discussed at [employer] of Stay Meetings, dates discussed:    Additional Comments: Brilinta Benefit Check BRILINTA 90 MG BID  COVER- YES  CO-PAY- $ 35.00  TIER- 2 DRUG  PRIOR APPROVAL- NO   NO DEDUCTIBLE   PREFERRED PHARMACY : YES [facility]   Pt denied hardship with paying for copay above.  Pt plans to [facility] his [facility] [place]. Maryclare Labrador, RN 03/02/2018, 8:51 AM

## 2018-03-02 NOTE — Progress Notes (Signed)
  Echocardiogram 2D Echocardiogram has been performed.  Jannett Celestine 03/02/2018, 9:50 AM

## 2018-03-02 NOTE — Progress Notes (Signed)
ANTICOAGULATION CONSULT NOTE   Pharmacy Consult for Heparin Indication: atrial fibrillation, also s/p cath  No Known Allergies  Patient Measurements: Height: 5\' 9"  (175.3 cm) Weight: 214 lb 8.1 oz (97.3 kg) IBW/kg (Calculated) : 70.7 Heparin Dosing Weight: 91.1  Vital Signs: Temp: 99.5 F (37.5 C) (08/01 1600) Temp Source: Oral (08/01 1600) BP: 100/55 (08/01 2300) Pulse Rate: 72 (08/01 2300)  Labs: Recent Labs    03/01/18 0514 03/01/18 0547 03/01/18 0738 03/01/18 1244 03/01/18 1936 03/01/18 2200  HGB 9.5* 9.5* 10.0*  --   --   --   HCT 30.5* 28.0* 31.5*  --   --   --   PLT 206  --  189  --   --   --   APTT 31  --   --   --   --   --   LABPROT 14.9  --   --   --   --   --   INR 1.18  --   --   --   --   --   HEPARINUNFRC  --   --   --   --   --  0.13*  CREATININE 0.93 0.70 0.83  --   --   --   TROPONINI 24.93*  --  22.43* 26.12* 22.81*  --     Estimated Creatinine Clearance: 88.4 mL/min (by C-G formula based on SCr of 0.83 mg/dL).   Medical History: Past Medical History:  Diagnosis Date  . Aortic valve stenosis, moderate cardiologist-- ganji   last echo 01-11-2017 by dr [name]-- moderate AV leaflet restricted w/  moderate AV stenosis, AVA 1.16cm^2  . Benign localized prostatic hyperplasia with lower urinary tract symptoms (LUTS)   . Carotid stenosis, left followed by dr [name], cardiologist--- bilateral bruit per note   per dr [name] note 74/9449  LICA 67-59% stenosis per duplex 05-14-2014  . Diverticulosis   . ED (erectile dysfunction)   . First degree heart block   . GERD (gastroesophageal reflux disease)   . Heart murmur   . History of pneumonia 08-22-2016 & 09-01-2016  . Hypertension    cardiologsit-  dr [name]  . Iron deficiency anemia   . Mixed hyperlipidemia   . Neuromuscular disorder ([name])    restless lrg  . OA (osteoarthritis)    right hip  . OSA on CPAP followed by dr dohmeier   per study's 02-08-2005 & 10-29-2008  severe osa uses cpap  .  Prostate cancer [facility]) urologist-  dr budzyn/  oncologist-  dr Tammi Klippel   dx 03-17-2017 (bx)  Stage T2a, Gleason 4+4, PSA  4.43, vol 32.32--  scheduled for radiatctive prostate seed implants 07-10-2017 then IMRT and ADT  . RLS (restless legs syndrome)   . Scoliosis   . Trigger finger   . Type 2 diabetes mellitus ([place])   . Wears partial dentures    upper and lower    Medications:  Scheduled:  . aspirin  81 mg Oral Daily  . colesevelam  1,875 mg Oral Daily  . donepezil  10 mg Oral q morning - 10a  . ezetimibe-simvastatin  1 tablet Oral q morning - 10a  . ferrous sulfate  325 mg Oral Once per day on Mon Thu  . insulin glargine  20 Units Subcutaneous QHS  . mouth rinse  15 mL Mouth Rinse BID  . metoprolol tartrate  25 mg Oral BID  . omega-3 acid ethyl esters  2 g Oral Daily  . pantoprazole  40 mg  Oral Daily  . sodium chloride flush  3 mL Intravenous Q12H  . tamsulosin  0.4 mg Oral QPC supper  . ticagrelor  90 mg Oral BID  . vitamin C  1,000 mg Oral Once per day on Mon Thu   Infusions:  . sodium chloride Stopped (03/01/18 0832)  . sodium chloride    . heparin 1,300 Units/hr (03/01/18 1345)   PRN: sodium chloride, acetaminophen, ALPRAZolam, fluticasone, nabumetone, ondansetron (ZOFRAN) IV, oxyCODONE-acetaminophen, rOPINIRole, sodium chloride flush, tiZANidine  Assessment: 77 yo M with new onset afib. Patient has a history of AV stenosis, 1st degree heart block, GERD, HTN, anemia, OSA, diabetes, and prostate cancer. Pharmacy was consulted to begin heparin therapy for afib. Pt is also s/p cath.   8/2 AM update: heparin level low, no issues per RN.   Goal of Therapy:  Heparin level 0.3-0.7 units/ml Monitor platelets by anticoagulation protocol: Yes   Plan:  Inc heparin to 1500 units/hr 0900 HL  Narda Bonds, PharmD, BCPS Clinical Pharmacist Phone: [phone]

## 2018-03-02 NOTE — Progress Notes (Signed)
ANTICOAGULATION CONSULT NOTE - Follow-Up Consult  Pharmacy Consult for heparin Indication: atrial fibrillation, s/p cath  No Known Allergies  Patient Measurements: Height: 5\' 9"  (175.3 cm) Weight: 214 lb 8.1 oz (97.3 kg) IBW/kg (Calculated) : 70.7 Heparin Dosing Weight: 91.1  Vital Signs: Temp: 99.6 F (37.6 C) (08/02 1700) Temp Source: Oral (08/02 1700) BP: 122/61 (08/02 2113) Pulse Rate: 87 (08/02 2113)  Labs: Recent Labs    03/01/18 0514 03/01/18 0547 03/01/18 0738 03/01/18 1244 03/01/18 1936 03/01/18 2200 03/02/18 0525 03/02/18 0951 03/02/18 2125  HGB 9.5* 9.5* 10.0*  --   --   --  8.4*  --   --   HCT 30.5* 28.0* 31.5*  --   --   --  26.2*  --   --   PLT 206  --  189  --   --   --  161  --   --   APTT 31  --   --   --   --   --   --   --   --   LABPROT 14.9  --   --   --   --   --   --   --   --   INR 1.18  --   --   --   --   --   --   --   --   HEPARINUNFRC  --   --   --   --   --  0.13*  --  0.17* 0.26*  CREATININE 0.93 0.70 0.83  --   --   --  0.97  --   --   TROPONINI 24.93*  --  22.43* 26.12* 22.81*  --   --   --   --     Estimated Creatinine Clearance: 75.7 mL/min (by C-G formula based on SCr of 0.97 mg/dL).   Medical History: Past Medical History:  Diagnosis Date  . Aortic valve stenosis, moderate cardiologist-- ganji   last echo 01-11-2017 by dr [name]-- moderate AV leaflet restricted w/  moderate AV stenosis, AVA 1.16cm^2  . Benign localized prostatic hyperplasia with lower urinary tract symptoms (LUTS)   . Carotid stenosis, left followed by dr [name], cardiologist--- bilateral bruit per note   per dr [name] note 27/7824  LICA 23-53% stenosis per duplex 05-14-2014  . Diverticulosis   . ED (erectile dysfunction)   . First degree heart block   . GERD (gastroesophageal reflux disease)   . Heart murmur   . History of pneumonia 08-22-2016 & 09-01-2016  . Hypertension    cardiologsit-  dr [name]  . Iron deficiency anemia   . Mixed hyperlipidemia   .  Neuromuscular disorder ([name])    restless lrg  . OA (osteoarthritis)    right hip  . OSA on CPAP followed by dr dohmeier   per study's 02-08-2005 & 10-29-2008  severe osa uses cpap  . Prostate cancer [facility]) urologist-  dr budzyn/  oncologist-  dr Tammi Klippel   dx 03-17-2017 (bx)  Stage T2a, Gleason 4+4, PSA  4.43, vol 32.32--  scheduled for radiatctive prostate seed implants 07-10-2017 then IMRT and ADT  . RLS (restless legs syndrome)   . Scoliosis   . Trigger finger   . Type 2 diabetes mellitus ([place])   . Wears partial dentures    upper and lower    Medications:  Scheduled:  . amoxicillin-clavulanate  1 tablet Oral TID  . aspirin  81 mg Oral Daily  . atorvastatin  80  mg Oral q1800  . colesevelam  1,875 mg Oral Daily  . donepezil  10 mg Oral q morning - 10a  . ezetimibe  10 mg Oral Daily  . ferrous sulfate  325 mg Oral Once per day on Mon Thu  . insulin glargine  20 Units Subcutaneous QHS  . mouth rinse  15 mL Mouth Rinse BID  . metoprolol tartrate  25 mg Oral BID  . omega-3 acid ethyl esters  2 g Oral Daily  . pantoprazole  40 mg Oral Daily  . sodium chloride flush  3 mL Intravenous Q12H  . tamsulosin  0.4 mg Oral QPC supper  . ticagrelor  90 mg Oral BID  . vitamin C  1,000 mg Oral Once per day on Mon Thu   Infusions:  . sodium chloride Stopped (03/01/18 1313)  . sodium chloride    . heparin 1,800 Units/hr (03/02/18 2000)   PRN: sodium chloride, acetaminophen, ALPRAZolam, fentaNYL (SUBLIMAZE) injection, fluticasone, loperamide, nabumetone, ondansetron (ZOFRAN) IV, oxyCODONE-acetaminophen, rOPINIRole, sodium chloride flush, tiZANidine  Assessment: 77 yo M with new onset afib. Patient has a history of AV stenosis, 1st degree heart block, GERD, HTN, anemia, OSA, diabetes, and prostate cancer. Pharmacy was consulted to begin heparin therapy for afib. Pt is also s/p cath.  Heparin level remains low but improved to 0.26.  Goal of Therapy:  Heparin level 0.3-0.7 units/ml Monitor  platelets by anticoagulation protocol: Yes   Plan:  Increase heparin to 1900 units/hr Recheck heparin level with am labs  Arrie Senate, PharmD, BCPS Clinical Pharmacist [phone] Please check AMION for all [place] numbers 03/02/2018

## 2018-03-02 NOTE — Progress Notes (Signed)
. Subjective:  No further A. Fib, dyspnea has improved. Has cough and congestion in his "sinuses" and chest pain on cough  Objective:  Vital Signs in the last 24 hours: Temp:  [99.2 F (37.3 C)-101 F (38.3 C)] 99.3 F (37.4 C) (08/02 0754) Pulse Rate:  [61-94] 77 (08/02 0800) Resp:  [15-30] 24 (08/02 0800) BP: (92-133)/(41-73) 112/57 (08/02 0800) SpO2:  [93 %-100 %] 94 % (08/02 0800)  Intake/Output from previous day: 08/01 0701 - 08/02 0700 In: 1703.6 [P.O.:720; I.V.:983.6] Out: 600 [Urine:600]  Physical Exam:  Physical Exam  Constitutional: He is oriented to person, place, and time. He appears well-developed and well-nourished. No distress.  Mildly obese  HENT:  Head: Atraumatic.  Eyes: Conjunctivae are normal.  Neck: Neck supple. No JVD present. No thyromegaly present.  Cardiovascular: Normal rate and regular rhythm.  Murmur heard.  Harsh crescendo-decrescendo midsystolic murmur is present with a grade of 3/6 at the upper right sternal border radiating to the neck. Pulses:      Carotid pulses are on the right side with bruit, and on the left side with bruit. S1 normal and S2 muffled, no gallop  Pulmonary/Chest: No respiratory distress. He has rales (bilateral bases).  Abdominal: Soft. Bowel sounds are normal.  Musculoskeletal: Normal range of motion. He exhibits no edema.  Neurological: He is alert and oriented to person, place, and time.  Skin: Skin is warm and dry.  Psychiatric: He has a normal mood and affect.    Lab Results: BMP Recent Labs    10/24/17 0438 03/01/18 0514 03/01/18 0547 03/01/18 0738 03/02/18 0525  NA 136 138 137  --  138  K 4.3 3.6 3.4*  --  3.0*  CL 102 103 101  --  105  CO2 24 24  --   --  23  GLUCOSE 148* 172* 171*  --  134*  BUN 17 10 10   --  16  CREATININE 0.94 0.93 0.70 0.83 0.97  CALCIUM 8.5* 9.0  --   --  8.3*  GFRNONAA >60 >60  --  >60 >60  GFRAA >60 >60  --  >60 >60    CBC Recent Labs  Lab 03/01/18 0514   03/02/18 0525  WBC 10.2   < > 9.6  RBC 3.43*   < > 3.01*  HGB 9.5*   < > 8.4*  HCT 30.5*   < > 26.2*  PLT 206   < > 161  MCV 88.9   < > 87.0  MCH 27.7   < > 27.9  MCHC 31.1   < > 32.1  RDW 14.8   < > 15.2  LYMPHSABS 0.4*  --   --   MONOABS 0.8  --   --   EOSABS 0.1  --   --   BASOSABS 0.0  --   --    < > = values in this interval not displayed.    HEMOGLOBIN A1C Lab Results  Component Value Date   HGBA1C 6.4 (H) 03/01/2018   MPG 136.98 03/01/2018    Cardiac Panel (last 3 results) Recent Labs    03/01/18 0738 03/01/18 1244 03/01/18 1936  TROPONINI 22.43* 26.12* 22.81*   Lipid Panel     Component Value Date/Time   CHOL 151 03/01/2018 0514   TRIG 69 03/01/2018 0514   HDL 55 03/01/2018 0514   CHOLHDL 2.7 03/01/2018 0514   VLDL 14 03/01/2018 0514   LDLCALC 82 03/01/2018 0514     Hepatic Function Panel Recent  Labs    07/03/17 1102 03/01/18 0514  PROT 7.1 6.3*  ALBUMIN 4.0 3.5  AST 21 90*  ALT 17 32  ALKPHOS 38 34*  BILITOT 0.4 1.1    Imaging: Dg Chest Port 1 View  Result Date: 03/02/2018 CLINICAL DATA:  Pneumonia EXAM: PORTABLE CHEST 1 VIEW COMPARISON:  03/01/2018 and prior radiographs FINDINGS: Cardiomegaly noted.  Interstitial pulmonary edema has decreased. Mild LEFT basilar atelectasis again noted. No other interval change. No evidence of pneumothorax. IMPRESSION: Decreased interstitial pulmonary edema. Electronically Signed   By: Margarette Canada M.D.   On: 03/02/2018 11:30   Dg Chest Portable 1 View  Result Date: 03/01/2018 CLINICAL DATA:  Sob,s/p STEMI early am today EXAM: PORTABLE CHEST 1 VIEW COMPARISON:  10/11/2017 FINDINGS: Heart size is mildly enlarged. There are diffuse bilateral interstitial markings consistent with interstitial pulmonary edema. No overt alveolar edema or pleural effusions. IMPRESSION: Interstitial pulmonary edema. Electronically Signed   By: Nolon Nations M.D.   On: 03/01/2018 08:26    Cardiac Studies:  EKG: EKG 03/01/2018:  Normal sinus rhythm, ST elevation with T wave inversion noted in V1 to V4 with Q waves, suggestive of acute injury pattern and evolving anteroseptal MI.  LVH. 03/02/2018" No change from yesterday. 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   ECHO: 03/02/2018 Left ventricle: The cavity size was mildly reduced. There was   moderate concentric hypertrophy. Systolic function was at the   lower limits of normal. The estimated ejection fraction was in   the range of 45% to 50%. Severe hypokinesis of the   apicalanterior, anterolateral, inferoseptal, and apical   myocardium. The study is not technically sufficient to allow   evaluation of LV diastolic function due to severe mitral   apparatus calcification. However suggests grade 2 diastolic   dysfunction with elevated LA and LVEDP. - Aortic valve: Normal-sized, mildly calcified annulus. Probably   trileaflet; moderately calcified leaflets. There was severe   stenosis. Mean gradient (S): 37 mm Hg. Peak gradient (S): 78 mm   Hg. Valve area (VTI): 0.72 cm^2. Valve area (Vmax): 0.8 cm^2.   Valve area (Vmean): 0.83 cm^2. - Mitral valve: Normal-sized, moderately calcified annulus.   Moderately calcified leaflets . The findings are consistent with   mild stenosis. Mean gradient (D): 8 mm Hg. Valve area by   continuity equation (using LVOT flow): 1.32 cm^2. - Left atrium: The atrium was mildly dilated.  Assessment/Plan: 1. 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 D2 moderate to large artery with high grade stenosis 2. Severe AS 3. Fever, Pneumonia with mild elevation in WBC, cough and pleuritic chest pain 4. DM controlled with hyperglycemia 5. Hyperlipidemia 6. Hypertension 7. Transient A. Fib resolved. Now on Heparin  Rec: Replace K, additional lasix for pulmonary edema. Repeat CXR for possible infiltrate and check UA, no obvious source to start antibiotics. D/W  RN. Continue ICU observation for now. Check Echo. CABG/TAVR in 4-6 weeks.  Change Zocor to Atorva due to drug interaction. Fentanyl for pleuritic chest pain. Doubt PE.   Adrian Prows, M.D. 03/02/2018, 8:47 AM [facility] Pager: [phone] Office: [phone] If no answer: [phone]

## 2018-03-02 NOTE — Progress Notes (Signed)
ANTICOAGULATION CONSULT NOTE - Initial Consult  Pharmacy Consult for heparin Indication: atrial fibrillation, s/p cath  No Known Allergies  Patient Measurements: Height: 5\' 9"  (175.3 cm) Weight: 214 lb 8.1 oz (97.3 kg) IBW/kg (Calculated) : 70.7 Heparin Dosing Weight: 91.1  Vital Signs: Temp: 99.3 F (37.4 C) (08/02 0754) Temp Source: Oral (08/02 0754) BP: 120/52 (08/02 1100) Pulse Rate: 80 (08/02 1100)  Labs: Recent Labs    03/01/18 0514 03/01/18 0547 03/01/18 0738 03/01/18 1244 03/01/18 1936 03/01/18 2200 03/02/18 0525 03/02/18 0951  HGB 9.5* 9.5* 10.0*  --   --   --  8.4*  --   HCT 30.5* 28.0* 31.5*  --   --   --  26.2*  --   PLT 206  --  189  --   --   --  161  --   APTT 31  --   --   --   --   --   --   --   LABPROT 14.9  --   --   --   --   --   --   --   INR 1.18  --   --   --   --   --   --   --   HEPARINUNFRC  --   --   --   --   --  0.13*  --  0.17*  CREATININE 0.93 0.70 0.83  --   --   --  0.97  --   TROPONINI 24.93*  --  22.43* 26.12* 22.81*  --   --   --     Estimated Creatinine Clearance: 75.7 mL/min (by C-G formula based on SCr of 0.97 mg/dL).   Medical History: Past Medical History:  Diagnosis Date  . Aortic valve stenosis, moderate cardiologist-- ganji   last echo 01-11-2017 by dr [name]-- moderate AV leaflet restricted w/  moderate AV stenosis, AVA 1.16cm^2  . Benign localized prostatic hyperplasia with lower urinary tract symptoms (LUTS)   . Carotid stenosis, left followed by dr [name], cardiologist--- bilateral bruit per note   per dr [name] note 15/1761  LICA 60-73% stenosis per duplex 05-14-2014  . Diverticulosis   . ED (erectile dysfunction)   . First degree heart block   . GERD (gastroesophageal reflux disease)   . Heart murmur   . History of pneumonia 08-22-2016 & 09-01-2016  . Hypertension    cardiologsit-  dr [name]  . Iron deficiency anemia   . Mixed hyperlipidemia   . Neuromuscular disorder ([name])    restless lrg  . OA  (osteoarthritis)    right hip  . OSA on CPAP followed by dr dohmeier   per study's 02-08-2005 & 10-29-2008  severe osa uses cpap  . Prostate cancer [facility]) urologist-  dr budzyn/  oncologist-  dr Tammi Klippel   dx 03-17-2017 (bx)  Stage T2a, Gleason 4+4, PSA  4.43, vol 32.32--  scheduled for radiatctive prostate seed implants 07-10-2017 then IMRT and ADT  . RLS (restless legs syndrome)   . Scoliosis   . Trigger finger   . Type 2 diabetes mellitus ([place])   . Wears partial dentures    upper and lower    Medications:  Scheduled:  . aspirin  81 mg Oral Daily  . atorvastatin  80 mg Oral q1800  . colesevelam  1,875 mg Oral Daily  . donepezil  10 mg Oral q morning - 10a  . ezetimibe  10 mg Oral Daily  . ferrous sulfate  325 mg Oral Once per day on Mon Thu  . insulin glargine  20 Units Subcutaneous QHS  . mouth rinse  15 mL Mouth Rinse BID  . metoprolol tartrate  25 mg Oral BID  . omega-3 acid ethyl esters  2 g Oral Daily  . pantoprazole  40 mg Oral Daily  . potassium chloride  20 mEq Oral Q1H  . sodium chloride flush  3 mL Intravenous Q12H  . tamsulosin  0.4 mg Oral QPC supper  . ticagrelor  90 mg Oral BID  . vitamin C  1,000 mg Oral Once per day on Mon Thu   Infusions:  . sodium chloride Stopped (03/01/18 1313)  . sodium chloride    . heparin 1,500 Units/hr (03/02/18 1200)   PRN: sodium chloride, acetaminophen, ALPRAZolam, fentaNYL (SUBLIMAZE) injection, fluticasone, nabumetone, ondansetron (ZOFRAN) IV, oxyCODONE-acetaminophen, rOPINIRole, sodium chloride flush, tiZANidine  Assessment: 77 yo M with new onset afib. Patient has a history of AV stenosis, 1st degree heart block, GERD, HTN, anemia, OSA, diabetes, and prostate cancer. Pharmacy was consulted to begin heparin therapy for afib. Pt is also s/p cath.  Patient has been in NSR since first afib event noted. Hgb now at 8.4. Heparin level low at 0.17. No bleeding noted per nurse.   Goal of Therapy:  Heparin level 0.3-0.7  units/ml Monitor platelets by anticoagulation protocol: Yes   Plan:  Increase heparin to 1800 units/hr Check anti-Xa level in 8 hours and daily while on heparin Continue to monitor H&H and platelets   Vertis Kelch, PharmD PGY1 Pharmacy Resident Phone [phone] 03/02/2018       12:06 PM

## 2018-03-02 NOTE — Progress Notes (Signed)
EKG CRITICAL VALUE     12 lead EKG performed.  Critical value noted.  Roland Rack, RN notified.   Micco Bourbeau, CCT 03/02/2018 7:47 AM

## 2018-03-03 LAB — HEPARIN LEVEL (UNFRACTIONATED): Heparin Unfractionated: 0.37 IU/mL (ref 0.30–0.70)

## 2018-03-03 LAB — CBC
HCT: 26.5 % — ABNORMAL LOW (ref 39.0–52.0)
Hemoglobin: 8.6 g/dL — ABNORMAL LOW (ref 13.0–17.0)
MCH: 27.9 pg (ref 26.0–34.0)
MCHC: 32.5 g/dL (ref 30.0–36.0)
MCV: 86 fL (ref 78.0–100.0)
Platelets: 180 10*3/uL (ref 150–400)
RBC: 3.08 MIL/uL — ABNORMAL LOW (ref 4.22–5.81)
RDW: 14.8 % (ref 11.5–15.5)
WBC: 8.5 10*3/uL (ref 4.0–10.5)

## 2018-03-03 LAB — BASIC METABOLIC PANEL
Anion gap: 9 (ref 5–15)
BUN: 15 mg/dL (ref 8–23)
CO2: 23 mmol/L (ref 22–32)
Calcium: 8.3 mg/dL — ABNORMAL LOW (ref 8.9–10.3)
Chloride: 104 mmol/L (ref 98–111)
Creatinine, Ser: 0.96 mg/dL (ref 0.61–1.24)
GFR calc Af Amer: >60 mL/min (ref >60)
GFR calc non Af Amer: >60 mL/min (ref >60)
Glucose, Bld: 136 mg/dL — ABNORMAL HIGH (ref 70–99)
Potassium: 3.5 mmol/L (ref 3.5–5.1)
Sodium: 136 mmol/L (ref 135–145)

## 2018-03-03 LAB — GLUCOSE, CAPILLARY: Glucose-Capillary: 162 mg/dL — ABNORMAL HIGH (ref 70–99)

## 2018-03-03 MED ORDER — AMIODARONE HCL 200 MG PO TABS
200.0000 mg | ORAL_TABLET | Freq: Three times a day (TID) | ORAL | Status: DC
  Administered 2018-03-03 – 2018-03-04 (×2): 200 mg via ORAL
  Filled 2018-03-03 (×2): qty 1

## 2018-03-03 MED ORDER — APIXABAN 5 MG PO TABS
5.0000 mg | ORAL_TABLET | Freq: Two times a day (BID) | ORAL | Status: DC
  Administered 2018-03-03 – 2018-03-04 (×3): 5 mg via ORAL
  Filled 2018-03-03 (×3): qty 1

## 2018-03-03 MED ORDER — AMIODARONE HCL 200 MG PO TABS
400.0000 mg | ORAL_TABLET | Freq: Three times a day (TID) | ORAL | Status: DC
  Administered 2018-03-03: 400 mg via ORAL
  Filled 2018-03-03: qty 2

## 2018-03-03 MED ORDER — FUROSEMIDE 10 MG/ML IJ SOLN
40.0000 mg | Freq: Once | INTRAMUSCULAR | Status: AC
  Administered 2018-03-03: 40 mg via INTRAVENOUS
  Filled 2018-03-03: qty 4

## 2018-03-03 MED ORDER — FLUTICASONE PROPIONATE 50 MCG/ACT NA SUSP
1.0000 | Freq: Every day | NASAL | Status: DC
  Administered 2018-03-03 – 2018-03-04 (×2): 1 via NASAL
  Filled 2018-03-03: qty 16

## 2018-03-03 MED ORDER — POTASSIUM CHLORIDE CRYS ER 20 MEQ PO TBCR
40.0000 meq | EXTENDED_RELEASE_TABLET | Freq: Once | ORAL | Status: AC
  Administered 2018-03-03: 40 meq via ORAL
  Filled 2018-03-03: qty 2

## 2018-03-03 NOTE — Progress Notes (Signed)
ANTICOAGULATION CONSULT NOTE - Follow-Up Consult  Pharmacy Consult for heparin Indication: atrial fibrillation, s/p cath  No Known Allergies  Patient Measurements: Height: 5\' 9"  (175.3 cm) Weight: 214 lb 8.1 oz (97.3 kg) IBW/kg (Calculated) : 70.7 Heparin Dosing Weight: 91.1  Vital Signs: Temp: 99.9 F (37.7 C) (08/03 0835) Temp Source: Oral (08/03 0835) BP: 127/71 (08/03 0946) Pulse Rate: 92 (08/03 0946)  Labs: Recent Labs    03/01/18 0514  03/01/18 0738 03/01/18 1244 03/01/18 1936  03/02/18 0525 03/02/18 0951 03/02/18 2125 03/03/18 0343  HGB 9.5*   < > 10.0*  --   --   --  8.4*  --   --  8.6*  HCT 30.5*   < > 31.5*  --   --   --  26.2*  --   --  26.5*  PLT 206  --  189  --   --   --  161  --   --  180  APTT 31  --   --   --   --   --   --   --   --   --   LABPROT 14.9  --   --   --   --   --   --   --   --   --   INR 1.18  --   --   --   --   --   --   --   --   --   HEPARINUNFRC  --   --   --   --   --    < >  --  0.17* 0.26* 0.37  CREATININE 0.93   < > 0.83  --   --   --  0.97  --   --  0.96  TROPONINI 24.93*  --  22.43* 26.12* 22.81*  --   --   --   --   --    < > = values in this interval not displayed.    Estimated Creatinine Clearance: 76.5 mL/min (by C-G formula based on SCr of 0.96 mg/dL).   Medical History: Past Medical History:  Diagnosis Date  . Aortic valve stenosis, moderate cardiologist-- ganji   last echo 01-11-2017 by dr [name]-- moderate AV leaflet restricted w/  moderate AV stenosis, AVA 1.16cm^2  . Benign localized prostatic hyperplasia with lower urinary tract symptoms (LUTS)   . Carotid stenosis, left followed by dr [name], cardiologist--- bilateral bruit per note   per dr [name] note 85/2778  LICA 24-23% stenosis per duplex 05-14-2014  . Diverticulosis   . ED (erectile dysfunction)   . First degree heart block   . GERD (gastroesophageal reflux disease)   . Heart murmur   . History of pneumonia 08-22-2016 & 09-01-2016  . Hypertension    cardiologsit-  dr [name]  . Iron deficiency anemia   . Mixed hyperlipidemia   . Neuromuscular disorder ([name])    restless lrg  . OA (osteoarthritis)    right hip  . OSA on CPAP followed by dr dohmeier   per study's 02-08-2005 & 10-29-2008  severe osa uses cpap  . Prostate cancer [facility]) urologist-  dr budzyn/  oncologist-  dr Tammi Klippel   dx 03-17-2017 (bx)  Stage T2a, Gleason 4+4, PSA  4.43, vol 32.32--  scheduled for radiatctive prostate seed implants 07-10-2017 then IMRT and ADT  . RLS (restless legs syndrome)   . Scoliosis   . Trigger finger   . Type 2 diabetes mellitus ([place])   .  Wears partial dentures    upper and lower    Medications:  Scheduled:  . amiodarone  400 mg Oral TID  . amoxicillin-clavulanate  1 tablet Oral TID  . apixaban  5 mg Oral BID  . aspirin  81 mg Oral Daily  . atorvastatin  80 mg Oral q1800  . colesevelam  1,875 mg Oral Daily  . donepezil  10 mg Oral q morning - 10a  . ezetimibe  10 mg Oral Daily  . ferrous sulfate  325 mg Oral Once per day on Mon Thu  . fluticasone  1 spray Each Nare Daily  . furosemide  40 mg Intravenous Once  . insulin glargine  20 Units Subcutaneous QHS  . mouth rinse  15 mL Mouth Rinse BID  . metoprolol tartrate  25 mg Oral BID  . omega-3 acid ethyl esters  2 g Oral Daily  . pantoprazole  40 mg Oral Daily  . potassium chloride  40 mEq Oral Once  . sodium chloride flush  3 mL Intravenous Q12H  . tamsulosin  0.4 mg Oral QPC supper  . ticagrelor  90 mg Oral BID  . vitamin C  1,000 mg Oral Once per day on Mon Thu   Infusions:  . sodium chloride Stopped (03/01/18 1313)  . sodium chloride     PRN: sodium chloride, acetaminophen, ALPRAZolam, fentaNYL (SUBLIMAZE) injection, fluticasone, loperamide, nabumetone, ondansetron (ZOFRAN) IV, oxyCODONE-acetaminophen, rOPINIRole, sodium chloride flush, tiZANidine  Assessment: 77 yo M with new onset afib. Patient has a history of AV stenosis, 1st degree heart block, GERD, HTN, anemia, OSA,  diabetes, and prostate cancer. Pharmacy was consulted to begin heparin therapy for afib. Pt is also s/p cath.  Heparin level at goal this morning at 0.37, hgb low but stable. New orders to transition to apixaban per cards this am.   Goal of Therapy:  Heparin level 0.3-0.7 units/ml Monitor platelets by anticoagulation protocol: Yes   Plan:  D/c heparin and start apix 5mg  bid this am  Erin Hearing PharmD., BCPS Clinical Pharmacist 03/03/2018 10:28 AM

## 2018-03-03 NOTE — Progress Notes (Signed)
CARDIAC REHAB PHASE I   PRE:  Rate/Rhythm: 69 Afib  BP:  Supine:   Sitting:128/61   Standing:    SaO2: 97 RA  MODE:  Ambulation: 295 ft   POST:  Rate/Rhythm: 80 Afib  BP:  Supine:   Sitting: 125/64  Standing:    SaO2: 95 RA 1210-1235 Assisted X 2 and used walker to ambulate. Gait steady with walker. He rubs sternum, I questioned him about this. He states that when he moves he has discomfort, that it is sore. Pt was able to walk 295 feet. Back to recliner after walk with call light in reach and wife present. Will follow pt next walk as assist X 1.  Rodney Langton RN 03/03/2018 12:33 PM

## 2018-03-03 NOTE — Progress Notes (Addendum)
. Subjective:  No further A. Fib, dyspnea has improved. Congestion in his "sinuses" improved as well. Chest pain on cough and bending forward improved as well. Ambulated.  Objective:  Vital Signs in the last 24 hours: Temp:  [99.6 F (37.6 C)-100.6 F (38.1 C)] 99.8 F (37.7 C) (08/03 1211) Pulse Rate:  [36-132] 36 (08/03 1300) Resp:  [15-30] 23 (08/03 1300) BP: (109-133)/(56-74) 109/57 (08/03 1300) SpO2:  [95 %-99 %] 97 % (08/03 1300)  Intake/Output from previous day: 08/02 0701 - 08/03 0700 In: 1805.7 [P.O.:1340; I.V.:465.7] Out: 9417 [Urine:1535]  Physical Exam:  Physical Exam  Constitutional: He is oriented to person, place, and time. He appears well-developed and well-nourished. No distress.  Mildly obese  HENT:  Head: Atraumatic.  Eyes: Conjunctivae are normal.  Neck: Neck supple. No JVD present. No thyromegaly present.  Cardiovascular: Normal rate and regular rhythm.  Murmur heard.  Harsh crescendo-decrescendo midsystolic murmur is present with a grade of 3/6 at the upper right sternal border radiating to the neck. Pulses:      Carotid pulses are on the right side with bruit, and on the left side with bruit. S1 normal and S2 muffled, no gallop  Pulmonary/Chest: Effort normal. No respiratory distress. He has no rales (bilateral bases).  Abdominal: Soft. Bowel sounds are normal.  Musculoskeletal: Normal range of motion. He exhibits no edema.  Neurological: He is alert and oriented to person, place, and time.  Skin: Skin is warm and dry.  Psychiatric: He has a normal mood and affect.    Lab Results: BMP Recent Labs    03/01/18 0514 03/01/18 0547 03/01/18 0738 03/02/18 0525 03/03/18 0343  NA 138 137  --  138 136  K 3.6 3.4*  --  3.0* 3.5  CL 103 101  --  105 104  CO2 24  --   --  23 23  GLUCOSE 172* 171*  --  134* 136*  BUN 10 10  --  16 15  CREATININE 0.93 0.70 0.83 0.97 0.96  CALCIUM 9.0  --   --  8.3* 8.3*  GFRNONAA >60  --  >60 >60 >60  GFRAA >60   --  >60 >60 >60    CBC Recent Labs  Lab 03/01/18 0514  03/03/18 0343  WBC 10.2   < > 8.5  RBC 3.43*   < > 3.08*  HGB 9.5*   < > 8.6*  HCT 30.5*   < > 26.5*  PLT 206   < > 180  MCV 88.9   < > 86.0  MCH 27.7   < > 27.9  MCHC 31.1   < > 32.5  RDW 14.8   < > 14.8  LYMPHSABS 0.4*  --   --   MONOABS 0.8  --   --   EOSABS 0.1  --   --   BASOSABS 0.0  --   --    < > = values in this interval not displayed.    HEMOGLOBIN A1C Lab Results  Component Value Date   HGBA1C 6.4 (H) 03/01/2018   MPG 136.98 03/01/2018    Cardiac Panel (last 3 results) Recent Labs    03/01/18 0738 03/01/18 1244 03/01/18 1936  TROPONINI 22.43* 26.12* 22.81*   Lipid Panel     Component Value Date/Time   CHOL 151 03/01/2018 0514   TRIG 69 03/01/2018 0514   HDL 55 03/01/2018 0514   CHOLHDL 2.7 03/01/2018 0514   VLDL 14 03/01/2018 0514   LDLCALC 82 03/01/2018  9604     Hepatic Function Panel Recent Labs    07/03/17 1102 03/01/18 0514  PROT 7.1 6.3*  ALBUMIN 4.0 3.5  AST 21 90*  ALT 17 32  ALKPHOS 38 34*  BILITOT 0.4 1.1    Imaging: Dg Chest Port 1 View  Result Date: 03/02/2018 CLINICAL DATA:  Pneumonia EXAM: PORTABLE CHEST 1 VIEW COMPARISON:  03/01/2018 and prior radiographs FINDINGS: Cardiomegaly noted.  Interstitial pulmonary edema has decreased. Mild LEFT basilar atelectasis again noted. No other interval change. No evidence of pneumothorax. IMPRESSION: Decreased interstitial pulmonary edema. Electronically Signed   By: Margarette Canada M.D.   On: 03/02/2018 11:30    Cardiac Studies:  EKG: EKG 03/01/2018: Normal sinus rhythm, ST elevation with T wave inversion noted in V1 to V4 with Q waves, suggestive of acute injury pattern and evolving anteroseptal MI.  LVH. 03/02/2018" No change from yesterday. 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   ECHO: 03/02/2018 Left ventricle: The cavity size was mildly reduced. There was   moderate concentric hypertrophy. Systolic  function was at the   lower limits of normal. The estimated ejection fraction was in   the range of 45% to 50%. Severe hypokinesis of the   apicalanterior, anterolateral, inferoseptal, and apical   myocardium. The study is not technically sufficient to allow   evaluation of LV diastolic function due to severe mitral   apparatus calcification. However suggests grade 2 diastolic   dysfunction with elevated LA and LVEDP. - Aortic valve: Normal-sized, mildly calcified annulus. Probably   trileaflet; moderately calcified leaflets. There was severe   stenosis. Mean gradient (S): 37 mm Hg. Peak gradient (S): 78 mm   Hg. Valve area (VTI): 0.72 cm^2. Valve area (Vmax): 0.8 cm^2.   Valve area (Vmean): 0.83 cm^2. - Mitral valve: Normal-sized, moderately calcified annulus.   Moderately calcified leaflets . The findings are consistent with   mild stenosis. Mean gradient (D): 8 mm Hg. Valve area by   continuity equation (using LVOT flow): 1.32 cm^2. - Left atrium: The atrium was mildly dilated.  Assessment/Plan:  1. 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 D2 moderate to large artery with high grade stenosis 2. Severe AS 3. Fever, with mild elevation in WBC, cough probably upper respiratory infection. Pleuritic chest pain improved. Do not suspect PE 4. DM controlled with hyperglycemia 5. Hyperlipidemia 6. Hypertension 7. 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 8. Anemia.   Rec: Patient now on Amiodarone, no high degree temperature, on Augmentin, On Eliquis now. Will discontinue Brilinta as his Hb is also trending down. No H/O GIB. Keep ICU/Step down today and transfer to tele tomorrow and probably home Monday. Will assess home needs, CM to check.   Adrian Prows, M.D. 03/03/2018, 2:17 PM [facility] [place] Pager: [phone] Office: [phone] If no answer: [phone]

## 2018-03-04 LAB — GLUCOSE, CAPILLARY
Glucose-Capillary: 126 mg/dL — ABNORMAL HIGH (ref 70–99)
Glucose-Capillary: 131 mg/dL — ABNORMAL HIGH (ref 70–99)
Glucose-Capillary: 169 mg/dL — ABNORMAL HIGH (ref 70–99)

## 2018-03-04 LAB — COMPREHENSIVE METABOLIC PANEL
ALT: 18 U/L (ref 0–44)
AST: 19 U/L (ref 15–41)
Albumin: 2.8 g/dL — ABNORMAL LOW (ref 3.5–5.0)
Alkaline Phosphatase: 33 U/L — ABNORMAL LOW (ref 38–126)
Anion gap: 11 (ref 5–15)
BUN: 16 mg/dL (ref 8–23)
CO2: 23 mmol/L (ref 22–32)
Calcium: 8.6 mg/dL — ABNORMAL LOW (ref 8.9–10.3)
Chloride: 102 mmol/L (ref 98–111)
Creatinine, Ser: 0.98 mg/dL (ref 0.61–1.24)
GFR calc Af Amer: >60 mL/min (ref >60)
GFR calc non Af Amer: >60 mL/min (ref >60)
Glucose, Bld: 125 mg/dL — ABNORMAL HIGH (ref 70–99)
Potassium: 4 mmol/L (ref 3.5–5.1)
Sodium: 136 mmol/L (ref 135–145)
Total Bilirubin: 0.6 mg/dL (ref 0.3–1.2)
Total Protein: 6.3 g/dL — ABNORMAL LOW (ref 6.5–8.1)

## 2018-03-04 MED ORDER — TAMSULOSIN HCL 0.4 MG PO CAPS
0.4000 mg | ORAL_CAPSULE | Freq: Every day | ORAL | Status: DC
  Administered 2018-03-04: 0.4 mg via ORAL
  Filled 2018-03-04: qty 1

## 2018-03-04 MED ORDER — NITROGLYCERIN 0.4 MG SL SUBL: 0.4000 mg | SUBLINGUAL_TABLET | SUBLINGUAL | Status: DC | PRN

## 2018-03-04 MED ORDER — AMIODARONE HCL 200 MG PO TABS
200.0000 mg | ORAL_TABLET | Freq: Three times a day (TID) | ORAL | Status: DC
  Administered 2018-03-04 – 2018-03-05 (×3): 200 mg via ORAL
  Filled 2018-03-04 (×3): qty 1

## 2018-03-04 MED ORDER — TICAGRELOR 90 MG PO TABS: 180.0000 mg | ORAL_TABLET | Freq: Once | ORAL | Status: DC

## 2018-03-04 MED ORDER — OXYCODONE-ACETAMINOPHEN 5-325 MG PO TABS
1.0000 | ORAL_TABLET | ORAL | Status: DC | PRN
  Administered 2018-03-04 – 2018-03-05 (×2): 1 via ORAL
  Filled 2018-03-04 (×2): qty 1

## 2018-03-04 MED ORDER — ROPINIROLE HCL 1 MG PO TABS
2.0000 mg | ORAL_TABLET | Freq: Two times a day (BID) | ORAL | Status: DC | PRN
Start: 2018-03-04 — End: 2018-03-05
  Administered 2018-03-04 – 2018-03-05 (×2): 2 mg via ORAL
  Filled 2018-03-04 (×3): qty 4

## 2018-03-04 MED ORDER — ACETAMINOPHEN 325 MG PO TABS
650.0000 mg | ORAL_TABLET | ORAL | Status: DC | PRN
Start: 2018-03-04 — End: 2018-03-05
  Administered 2018-03-04: 650 mg via ORAL
  Filled 2018-03-04: qty 2

## 2018-03-04 MED ORDER — FENTANYL CITRATE (PF) 100 MCG/2ML IJ SOLN
25.0000 ug | INTRAMUSCULAR | Status: DC | PRN
Start: 2018-03-04 — End: 2018-03-05
  Administered 2018-03-04: 50 ug via INTRAVENOUS
  Filled 2018-03-04: qty 2

## 2018-03-04 MED ORDER — INSULIN ASPART 100 UNIT/ML ~~LOC~~ SOLN
0.0000 [IU] | Freq: Three times a day (TID) | SUBCUTANEOUS | Status: DC
  Administered 2018-03-05: 2 [IU] via SUBCUTANEOUS

## 2018-03-04 MED ORDER — AMOXICILLIN-POT CLAVULANATE 500-125 MG PO TABS
500.0000 mg | ORAL_TABLET | Freq: Three times a day (TID) | ORAL | Status: DC
  Administered 2018-03-04 – 2018-03-05 (×3): 500 mg via ORAL
  Filled 2018-03-04 (×4): qty 1

## 2018-03-04 MED ORDER — ALPRAZOLAM 0.5 MG PO TABS: 1.0000 mg | ORAL_TABLET | Freq: Two times a day (BID) | ORAL | Status: DC | PRN

## 2018-03-04 MED ORDER — ATORVASTATIN CALCIUM 80 MG PO TABS
80.0000 mg | ORAL_TABLET | Freq: Every day | ORAL | Status: DC
  Administered 2018-03-05: 80 mg via ORAL
  Filled 2018-03-04 (×2): qty 1
  Filled 2018-03-04: qty 2
  Filled 2018-03-04: qty 1

## 2018-03-04 NOTE — Discharge Instructions (Signed)
Information on my medicine - ELIQUIS® (apixaban) ° °This medication education was reviewed with me or my healthcare representative as part of my discharge preparation.  The pharmacist that spoke with me during my hospital stay was:  Rhylan Gross Rhea, RPH ° °Why was Eliquis® prescribed for you? °Eliquis® was prescribed for you to reduce the risk of a blood clot forming that can cause a stroke if you have a medical condition called atrial fibrillation (a type of irregular heartbeat). ° °What do You need to know about Eliquis® ? °Take your Eliquis® TWICE DAILY - one tablet in the morning and one tablet in the evening with or without food. If you have difficulty swallowing the tablet whole please discuss with your pharmacist how to take the medication safely. ° °Take Eliquis® exactly as prescribed by your doctor and DO NOT stop taking Eliquis® without talking to the doctor who prescribed the medication.  Stopping may increase your risk of developing a stroke.  Refill your prescription before you run out. ° °After discharge, you should have regular check-up appointments with your healthcare provider that is prescribing your Eliquis®.  In the future your dose may need to be changed if your kidney function or weight changes by a significant amount or as you get older. ° °What do you do if you miss a dose? °If you miss a dose, take it as soon as you remember on the same day and resume taking twice daily.  Do not take more than one dose of ELIQUIS at the same time to make up a missed dose. ° °Important Safety Information °A possible side effect of Eliquis® is bleeding. You should call your healthcare provider right away if you experience any of the following: °  Bleeding from an injury or your nose that does not stop. °  Unusual colored urine (red or dark brown) or unusual colored stools (red or black). °  Unusual bruising for unknown reasons. °  A serious fall or if you hit your head (even if there is no  bleeding). ° °Some medicines may interact with Eliquis® and might increase your risk of bleeding or clotting while on Eliquis®. To help avoid this, consult your healthcare provider or pharmacist prior to using any new prescription or non-prescription medications, including herbals, vitamins, non-steroidal anti-inflammatory drugs (NSAIDs) and supplements. ° °This website has more information on Eliquis® (apixaban): http://www.eliquis.com/eliquis/home ° °

## 2018-03-04 NOTE — Progress Notes (Signed)
Pt came to the unit on a stable condition There was no medicine at all in [facility], RN talked to the MD and put medicines on [facility] per verbal order (just put the medicine what RN needed for that time since it was a long list of medicine and Rn just priotizing what she needed for the moment) According to MD he is outside and not able to put the medicine order. After 15 mins pt developed chest pain, EKG done asap (it was not changed than previous one) and percocet given after reordering it. Meanwhile Requip also reordered and given as pt is having restless leg syndrome  RN called MD to inform the pt's condition, MD asked RN to put Fentanyl order, RN was wondering to MD when he is able to put rest of the orders, he was asking RN for head nurse and ICU nurse to put the orders and eventually he called to the desk and asked another RN to put order for Fentanyl while RN's concern was not Fentanyl only but putting rest his medicines on MAR.  Pt is comfortable and resting well this time on his CPAP, wife is in bed side and is updating.  Palma Holter, RN

## 2018-03-04 NOTE — Progress Notes (Signed)
Diet order placed and called to kitchen

## 2018-03-04 NOTE — Evaluation (Signed)
Physical Therapy Evaluation Patient Details Name: Cody Phelps MRN: [id number] DOB: 24-Feb-1941 Today's Date: 03/04/2018   History of Present Illness  Cody Phelps  is a 77 y.o. male  With moderate aortic stenosis, uncontrolled hypertension,  prostate cancer, sedentary lifestyle, obesity, controlled type 2 diabetes mellitus without complications, obstructive sleep apnea on CPAP, asymptomatic moderate carotid artery stenosis, mixed hyperlipidemia developed severe chest pain that started on 02/28/2018 at 9 AM and has been persistent.  Clinical Impression  Pt admitted with above. Pt able tolerate ambulation and stair negotiation this date. Wife present. Pt unsteady and at increased falls risk if walking without AD. Pt did complain of 3/10 chest tightness, RN notified. Pt agrees to use RW. Acute PT to cont to follow.    Follow Up Recommendations No PT follow up;Supervision/Assistance - 24 hour    Equipment Recommendations  None recommended by PT    Recommendations for Other Services       Precautions / Restrictions Precautions Precautions: Fall Restrictions Weight Bearing Restrictions: No      Mobility  Bed Mobility Overal bed mobility: Modified Independent             General bed mobility comments: pt was up in chair upon PT arrival, pt able to return self ot supine without physical assist or HOB elevated  Transfers Overall transfer level: Needs assistance Equipment used: Rolling walker (2 wheeled) Transfers: Sit to/from Stand Sit to Stand: Min guard         General transfer comment: pt pushed up from bed, steady during transition of hands  Ambulation/Gait Ambulation/Gait assistance: Min guard Gait Distance (Feet): 465 Feet Assistive device: Rolling walker (2 wheeled) Gait Pattern/deviations: Step-through pattern;Decreased stride length;Trunk flexed Gait velocity: average Gait velocity interpretation: 1.31 - 2.62 ft/sec, indicative of limited community  ambulator General Gait Details: verbal cues to look up and achieve trunk extension. pt with reports of "i'm a little winded" but was able to amb and do 2 sets of stairs with rest break. c/o 3/10 chest tightness, no episodes of LOB  Stairs Stairs: Yes Stairs assistance: Min guard Stair Management: One rail Right;Step to pattern Number of Stairs: 5(x2) General stair comments: pt with good technique  Wheelchair Mobility    Modified Rankin (Stroke Patients Only)       Balance Overall balance assessment: (needs RW for safe amb)                                           Pertinent Vitals/Pain Pain Assessment: 0-10 Pain Score: 3  Pain Location: chest Pain Descriptors / Indicators: Tightness Pain Intervention(s): (RN made aware)    Home Living Family/patient expects to be discharged to:: Private residence Living Arrangements: Spouse/significant other Available Help at Discharge: Family;Available 24 hours/day Type of Home: House Home Access: Stairs to enter Entrance Stairs-Rails: Right Entrance Stairs-Number of Steps: 3 Home Layout: One level Home Equipment: Walker - 2 wheels;Cane - single point;Bedside commode;Shower seat - built in Additional Comments: pt has equipment from back surgery in 2015    Prior Function Level of Independence: Independent with assistive device(s)         Comments: needed cane at times for pain     Hand Dominance   Dominant Hand: Right    Extremity/Trunk Assessment   Upper Extremity Assessment Upper Extremity Assessment: Overall WFL for tasks assessed    Lower Extremity Assessment Lower  Extremity Assessment: Overall WFL for tasks assessed    Cervical / Trunk Assessment Cervical / Trunk Assessment: Normal  Communication   Communication: No difficulties  Cognition Arousal/Alertness: Awake/alert Behavior During Therapy: Flat affect Overall Cognitive Status: Within Functional Limits for tasks assessed                                         General Comments General comments (skin integrity, edema, etc.): Pt able to pull socks up on his feet    Exercises     Assessment/Plan    PT Assessment Patient needs continued PT services  PT Problem List Decreased activity tolerance;Decreased balance;Decreased mobility       PT Treatment Interventions DME instruction;Gait training;Stair training;Functional mobility training;Therapeutic activities;Therapeutic exercise;Balance training    PT Goals (Current goals can be found in the Care Plan section)  Acute Rehab PT Goals Patient Stated Goal: home ASAP PT Goal Formulation: With patient Time For Goal Achievement: 03/18/18 Potential to Achieve Goals: Good    Frequency Min 3X/week   Barriers to discharge        Co-evaluation               AM-PAC PT "6 Clicks" Daily Activity  Outcome Measure Difficulty turning over in bed (including adjusting bedclothes, sheets and blankets)?: None Difficulty moving from lying on back to sitting on the side of the bed? : None Difficulty sitting down on and standing up from a chair with arms (e.g., wheelchair, bedside commode, etc,.)?: None Help needed moving to and from a bed to chair (including a wheelchair)?: None Help needed walking in hospital room?: None Help needed climbing 3-5 steps with a railing? : A Little 6 Click Score: 23    End of Session Equipment Utilized During Treatment: Gait belt Activity Tolerance: Patient tolerated treatment well Patient left: in bed;with call bell/phone within reach;with family/visitor present Nurse Communication: Mobility status(3/10 chest pain) PT Visit Diagnosis: Unsteadiness on feet (R26.81)    Time: 5726-2035 PT Time Calculation (min) (ACUTE ONLY): 24 min   Charges:   PT Evaluation $PT Eval Moderate Complexity: 1 Mod PT Treatments $Gait Training: 8-22 mins        Kittie Plater, PT, DPT Pager #: [phone] Office #: [phone]   [place] 03/04/2018, 2:32 PM

## 2018-03-04 NOTE — Progress Notes (Signed)
. Subjective:  Feels better. Has cougha dn productive white sputum. Low grade temperature persists. Congestion in his "sinuses" improved as well. Chest pain on cough and bending forward improved as well. Ambulated. Sitting at bedside and feels well overall.  Objective:  Vital Signs in the last 24 hours: Temp:  [98.1 F (36.7 C)-99.5 F (37.5 C)] 98.5 F (36.9 C) (08/04 0400) Pulse Rate:  [65-88] 76 (08/04 1215) Resp:  [10-28] 17 (08/04 1215) BP: (110-151)/(49-70) 133/68 (08/04 1215) SpO2:  [96 %-100 %] 96 % (08/04 1215)  Intake/Output from previous day: 08/03 0701 - 08/04 0700 In: 1209.5 [P.O.:1140; I.V.:69.5] Out: 1850 [Urine:1850]  Physical Exam:  Physical Exam  Constitutional: He is oriented to person, place, and time. He appears well-developed and well-nourished. No distress.  Mildly obese  HENT:  Head: Atraumatic.  Eyes: Conjunctivae are normal.  Neck: Neck supple. No JVD present. No thyromegaly present.  Cardiovascular: Normal rate and regular rhythm.  Murmur heard.  Harsh crescendo-decrescendo midsystolic murmur is present with a grade of 3/6 at the upper right sternal border radiating to the neck. Pulses:      Carotid pulses are on the right side with bruit, and on the left side with bruit. S1 normal and S2 muffled, no gallop  Pulmonary/Chest: Effort normal and breath sounds normal. No respiratory distress. He has no rales (bilateral bases).  Abdominal: Soft. Bowel sounds are normal.  Musculoskeletal: Normal range of motion. He exhibits no edema.  Neurological: He is alert and oriented to person, place, and time.  Skin: Skin is warm and dry.  Psychiatric: He has a normal mood and affect.    Lab Results: BMP Recent Labs    03/02/18 0525 03/03/18 0343 03/04/18 0224  NA 138 136 136  K 3.0* 3.5 4.0  CL 105 104 102  CO2 23 23 23   GLUCOSE 134* 136* 125*  BUN 16 15 16   CREATININE 0.97 0.96 0.98  CALCIUM 8.3* 8.3* 8.6*  GFRNONAA >60 >60 >60  GFRAA >60 >60  >60    CBC Recent Labs  Lab 03/01/18 0514  03/03/18 0343  WBC 10.2   < > 8.5  RBC 3.43*   < > 3.08*  HGB 9.5*   < > 8.6*  HCT 30.5*   < > 26.5*  PLT 206   < > 180  MCV 88.9   < > 86.0  MCH 27.7   < > 27.9  MCHC 31.1   < > 32.5  RDW 14.8   < > 14.8  LYMPHSABS 0.4*  --   --   MONOABS 0.8  --   --   EOSABS 0.1  --   --   BASOSABS 0.0  --   --    < > = values in this interval not displayed.    HEMOGLOBIN A1C Lab Results  Component Value Date   HGBA1C 6.4 (H) 03/01/2018   MPG 136.98 03/01/2018    Cardiac Panel (last 3 results) Recent Labs    03/01/18 0738 03/01/18 1244 03/01/18 1936  TROPONINI 22.43* 26.12* 22.81*   Lipid Panel     Component Value Date/Time   CHOL 151 03/01/2018 0514   TRIG 69 03/01/2018 0514   HDL 55 03/01/2018 0514   CHOLHDL 2.7 03/01/2018 0514   VLDL 14 03/01/2018 0514   LDLCALC 82 03/01/2018 0514     Hepatic Function Panel Recent Labs    07/03/17 1102 03/01/18 0514 03/04/18 0224  PROT 7.1 6.3* 6.3*  ALBUMIN 4.0 3.5 2.8*  AST 21 90* 19  ALT 17 32 18  ALKPHOS 38 34* 33*  BILITOT 0.4 1.1 0.6    Imaging: No results found.  Cardiac Studies:  EKG: EKG 03/01/2018: Normal sinus rhythm, ST elevation with T wave inversion noted in V1 to V4 with Q waves, suggestive of acute injury pattern and evolving anteroseptal MI.  LVH. 03/02/2018" No change from yesterday. 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    ECHO: 03/02/2018 Left ventricle: The cavity size was mildly reduced. There was   moderate concentric hypertrophy. Systolic function was at the   lower limits of normal. The estimated ejection fraction was in   the range of 45% to 50%. Severe hypokinesis of the   apicalanterior, anterolateral, inferoseptal, and apical   myocardium. The study is not technically sufficient to allow   evaluation of LV diastolic function due to severe mitral   apparatus calcification. However suggests grade 2 diastolic   dysfunction  with elevated LA and LVEDP. - Aortic valve: Normal-sized, mildly calcified annulus. Probably   trileaflet; moderately calcified leaflets. There was severe   stenosis. Mean gradient (S): 37 mm Hg. Peak gradient (S): 78 mm   Hg. Valve area (VTI): 0.72 cm^2. Valve area (Vmax): 0.8 cm^2.   Valve area (Vmean): 0.83 cm^2. - Mitral valve: Normal-sized, moderately calcified annulus.   Moderately calcified leaflets . The findings are consistent with   mild stenosis. Mean gradient (D): 8 mm Hg. Valve area by   continuity equation (using LVOT flow): 1.32 cm^2. - Left atrium: The atrium was mildly dilated.  Assessment/Plan:  1. 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 D2 moderate to large artery with high grade stenosis 2. Severe AS 3. Fever, with mild elevation in WBC, cough probably upper respiratory infection. Pleuritic chest pain improved. Do not suspect PE 4. DM controlled with hyperglycemia 5. Hyperlipidemia now on Lipitor and zetia, discontinued Zocor due to Brilinta interaction, but can be resumed at discharge. 6. Hypertension 7. 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 8. Anemia. Hb stable  Rec: Patient now on Amiodarone, maintains sinus. No high degree temperature, on Augmentin, low grade temperature. PAF On Eliquis now, Hb is stable. No H/O GIB. Transfer to tele today and probably home Monday. Will assess home needs, CM to check.   Adrian Prows, M.D. 03/04/2018, 1:15 PM [facility], [place] Pager: [phone] Office: [phone] If no answer: [phone]

## 2018-03-04 NOTE — Progress Notes (Signed)
Call received from Dr [name], requesting head nurse assist him in entering orders for patients musculoskeletal pain as he unintentionally discontinued them for the patient. Dr [name] denied need to speak to the assigned nurse upon offering as head nurse was unavailable. After several minutes of holding for head nurse who was on the other line with department director, he requested this RN assist in entering just two pain control orders and he will enter/address the remaining once he arrives at home this evening.    After call ended and orders were entered, assigned nurse expressed concern because she was reportedly asked to re-activate the orders (all medications) prior to the call to the "head nurse" which was not relayed to the nurse who entered orders while still on a call with Dr [name].   Assigned nurse informed that Dr [name] would place orders for the routine medications once he arrives at home and this nurse only entered the items received verbally from Dr [name].

## 2018-03-04 NOTE — Progress Notes (Signed)
Fentanyl 50mg  IV  given as pain not controled with percocet

## 2018-03-05 DIAGNOSIS — I480 Paroxysmal atrial fibrillation: Secondary | ICD-10-CM

## 2018-03-05 LAB — GLUCOSE, CAPILLARY: Glucose-Capillary: 145 mg/dL — ABNORMAL HIGH (ref 70–99)

## 2018-03-05 MED ORDER — METOPROLOL TARTRATE 12.5 MG HALF TABLET
12.5000 mg | ORAL_TABLET | Freq: Once | ORAL | Status: AC
Start: 2018-03-05 — End: 2018-03-05
  Administered 2018-03-05: 12.5 mg via ORAL
  Filled 2018-03-05: qty 1

## 2018-03-05 MED ORDER — CALCIUM CARBONATE ANTACID 500 MG PO CHEW
1.0000 | CHEWABLE_TABLET | Freq: Three times a day (TID) | ORAL | Status: DC | PRN
  Administered 2018-03-05: 200 mg via ORAL

## 2018-03-05 MED ORDER — PANTOPRAZOLE SODIUM 40 MG PO TBEC
40.0000 mg | DELAYED_RELEASE_TABLET | Freq: Every day | ORAL | Status: DC
  Administered 2018-03-05: 40 mg via ORAL
  Filled 2018-03-05: qty 1

## 2018-03-05 MED ORDER — METOPROLOL TARTRATE 25 MG PO TABS: 12.5000 mg | ORAL_TABLET | Freq: Two times a day (BID) | ORAL | 3 refills | Status: DC

## 2018-03-05 MED ORDER — ACETAMINOPHEN 325 MG PO TABS: 325.0000 mg | ORAL_TABLET | ORAL | 3 refills | Status: DC | PRN

## 2018-03-05 MED ORDER — APIXABAN 5 MG PO TABS: 5.0000 mg | ORAL_TABLET | Freq: Two times a day (BID) | ORAL | 3 refills | Status: DC

## 2018-03-05 MED ORDER — AMOXICILLIN-POT CLAVULANATE 500-125 MG PO TABS: 500.0000 mg | ORAL_TABLET | Freq: Three times a day (TID) | ORAL | 0 refills | Status: DC

## 2018-03-05 MED ORDER — AMIODARONE HCL 200 MG PO TABS: 200.0000 mg | ORAL_TABLET | Freq: Three times a day (TID) | ORAL | 0 refills | Status: DC

## 2018-03-05 MED ORDER — APIXABAN 5 MG PO TABS
5.0000 mg | ORAL_TABLET | Freq: Two times a day (BID) | ORAL | Status: DC
  Administered 2018-03-05: 5 mg via ORAL
  Filled 2018-03-05: qty 1

## 2018-03-05 MED ORDER — NITROGLYCERIN 0.4 MG SL SUBL: 0.4000 mg | SUBLINGUAL_TABLET | SUBLINGUAL | 3 refills | Status: DC | PRN

## 2018-03-05 MED ORDER — INSULIN GLARGINE 100 UNIT/ML ~~LOC~~ SOLN
20.0000 [IU] | Freq: Every day | SUBCUTANEOUS | Status: DC
  Administered 2018-03-05: 20 [IU] via SUBCUTANEOUS
  Filled 2018-03-05 (×2): qty 0.2

## 2018-03-05 MED ORDER — ATORVASTATIN CALCIUM 80 MG PO TABS: 80.0000 mg | ORAL_TABLET | Freq: Every day | ORAL | 3 refills | Status: DC

## 2018-03-05 NOTE — Progress Notes (Signed)
Pt c/o mid right chest pain achy, daughter says he wants to take tums as he has indigestions at home with 5-10 tums a day, pt says the pain is constant since he has been here and 6 lying down but increases when he sits up, he has tums at bedside, I educated family on need to inform Dr [name] as no orders for tums, pt shook his head and grunted, I advised I need to advise him with his hx of cad , pt and family verbalized understanding

## 2018-03-05 NOTE — Progress Notes (Signed)
Patient converted to AFib from [place]. HR in 70's. Dr. [name] notified. No new orders.

## 2018-03-05 NOTE — Progress Notes (Signed)
CARDIAC REHAB PHASE I   PRE:  Rate/Rhythm: 71 afib  BP:  Supine: 117/71  Sitting:   Standing:    SaO2: 96%RA  MODE:  Ambulation: 400 ft   POST:  Rate/Rhythm: 88 afib  BP:  Supine:   Sitting: 133/64  Standing:    SaO2: 98%RA 0925-1035 Pt walked 400 ft with gait belt use, rolling walker and asst x1 with steady gait. Pt was having chest tightness/pressure 4-5/10 that increased to 6-7/10 with walk. Pt was coughing off and on the whole time I was with him educating and walking. After pt rested in recliner the discomfort was back to 4-5. Wife in room with pt for ed and walk. Reviewed NTG use, MI restrictions, gave diabetic and heart healthy diets and discussed CRP 2 for after he has staged procedures in future. Pt not sure if chest discomfort is from coughing, his heart burn or CP. Notified Dr Virgina Jock of walk and discomfort while on unit. If pt discharged,will refer activity at home to cardiologist.   Graylon Good, RN BSN  03/05/2018 10:31 AM

## 2018-03-05 NOTE — Progress Notes (Signed)
Pt and wife were given AVS by Ignacia Marvel charge RN, iv removed with tip intact, pt denies any pain at this time, pt wanted to eat lunch and now ready for dc, wife at bedside, pt stable

## 2018-03-05 NOTE — Discharge Summary (Signed)
Physician Discharge Summary  Patient ID: Cody Phelps MRN: [id number] DOB/AGE: [birth year] 77 y.o.  Admit date: 03/01/2018 Discharge date: 03/05/2018  Admission Diagnoses: Chest pain  Discharge Diagnoses:  Principal Problem:   Acute anterior wall MI [facility]) Active Problems:   Aortic stenosis, moderate   Acute MI anterior wall first episode care [facility])   Paroxysmal atrial fibrillation [facility])   Discharged Condition: good  Hospital Course:   Patient was admitted with chest pain and STEMI. Cath showed chronic appearing mid LAD occlusion with unsuccessful revascularization attempt. Medical management was recommended. During the hospital stay, within 2 days of admission, patient developed cough and low grade fever-which were treated as CAP with PO Augmentin. He also developed paroxysmal atrial flutter/fibrilation-which converted to sinus rhythm with PO amiodarone. Given his increased risk of bleeding with chemotherapy, aspirin 81 mg and eliquis 5 mg bid were recommended by his primary cardiologist and my partner-Dr. Adrian Prows.   Patient is doing well this morning. He does have baseline central chest pain which is more pleuritic in nature-and different from his chest pain present on the day of STEMI. He ambulated with no significant discomfort.  He is being discharged today with follow up on 08/012/2019 with Dr. [name]. Follow up discussions should include dose of amiodarone, further management regarding patients CAD and aortic stenosis.   Consults: None  Significant Diagnostic Studies: labs:  Results for DAYMOND, CORDTS" (MRN [id number]) as of 03/05/2018 14:35  Ref. Range 03/01/2018 07:38 03/02/2018 05:25 03/03/2018 03:43  WBC Latest Ref Range: 4.0 - 10.5 K/uL 11.1 (H) 9.6 8.5  RBC Latest Ref Range: 4.22 - 5.81 MIL/uL 3.60 (L) 3.01 (L) 3.08 (L)  Hemoglobin Latest Ref Range: 13.0 - 17.0 g/dL 10.0 (L) 8.4 (L) 8.6 (L)  HCT Latest Ref Range: 39.0 - 52.0 % 31.5 (L) 26.2 (L) 26.5 (L)  MCV Latest Ref Range:  78.0 - 100.0 fL 87.5 87.0 86.0  MCH Latest Ref Range: 26.0 - 34.0 pg 27.8 27.9 27.9  MCHC Latest Ref Range: 30.0 - 36.0 g/dL 31.7 32.1 32.5  RDW Latest Ref Range: 11.5 - 15.5 % 15.0 15.2 14.8  Platelets Latest Ref Range: 150 - 400 K/uL 189 161 180   Results for MASAI, KIDD" (MRN [id number]) as of 03/05/2018 14:35  Ref. Range 03/02/2018 05:25 03/03/2018 03:43 03/04/2018 27:06  BASIC METABOLIC PANEL Unknown Rpt (A) Rpt (A)   COMPREHENSIVE METABOLIC PANEL Unknown   Rpt (A)  Sodium Latest Ref Range: 135 - 145 mmol/L 138 136 136  Potassium Latest Ref Range: 3.5 - 5.1 mmol/L 3.0 (L) 3.5 4.0  Chloride Latest Ref Range: 98 - 111 mmol/L 105 104 102  CO2 Latest Ref Range: 22 - 32 mmol/L 23 23 23   Glucose Latest Ref Range: 70 - 99 mg/dL 134 (H) 136 (H) 125 (H)  BUN Latest Ref Range: 8 - 23 mg/dL 16 15 16   Creatinine Latest Ref Range: 0.61 - 1.24 mg/dL 0.97 0.96 0.98  Calcium Latest Ref Range: 8.9 - 10.3 mg/dL 8.3 (L) 8.3 (L) 8.6 (L)  Anion gap Latest Ref Range: 5 - 15  10 9 11   Alkaline Phosphatase Latest Ref Range: 38 - 126 U/L   33 (L)  Albumin Latest Ref Range: 3.5 - 5.0 g/dL   2.8 (L)  AST Latest Ref Range: 15 - 41 U/L   19  ALT Latest Ref Range: 0 - 44 U/L   18  Total Protein Latest Ref Range: 6.5 - 8.1 g/dL   6.3 (L)  Total Bilirubin Latest Ref Range: 0.3 - 1.2 mg/dL   0.6  GFR, Est Non African American Latest Ref Range: >60 mL/min >60 >60 >60  GFR, Est African American Latest Ref Range: >60 mL/min >60 >60 >60    Treatments:  Cath 03/01/2018: Coronary angiogram 03/20/2018: Left main mildly calcified but normal, LAD large caliber vessel, mild to moderate diffuse disease followed by occlusion in the mid to distal segment, distally the LAD appears to be diffusely diseased with TIMI I flow.  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  D1 is small, D-2 is moderate to  large sized with  tandem 80 to 90% stenosis, mild to moderate calcification.  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 RCA small, non-dominant and diffusely diseased without high-grade stenosis. Severe aortic stenosis with peak to peak gradient of 69 and mean gradient of 53 mmHg.  Severely calcified mitral annulus and moderately calcified aortic valve.  Echocardiogram 03/02/2018: Study Conclusions  - Left ventricle: The cavity size was mildly reduced. There was   moderate concentric hypertrophy. Systolic function was at the   lower limits of normal. The estimated ejection fraction was in   the range of 45% to 50%. Severe hypokinesis of the   apicalanterior, anterolateral, inferoseptal, and apical   myocardium. The study is not technically sufficient to allow   evaluation of LV diastolic function due to severe mitral   apparatus calcification. However suggests grade 2 diastolic   dysfunction with elevated LA and LVEDP. - Aortic valve: Normal-sized, mildly calcified annulus. Probably   trileaflet; moderately calcified leaflets. There was severe   stenosis. Mean gradient (S): 37 mm Hg. Peak gradient (S): 78 mm   Hg. Valve area (VTI): 0.72 cm^2. Valve area (Vmax): 0.8 cm^2.   Valve area (Vmean): 0.83 cm^2. - Mitral valve: Normal-sized, moderately calcified annulus.   Moderately calcified leaflets . The findings are consistent with   mild stenosis. Mean gradient (D): 8 mm Hg. Valve area by   continuity equation (using LVOT flow): 1.32 cm^2. - Left atrium: The atrium was mildly dilated.  Discharge Exam: Blood pressure 113/68, pulse 76, temperature 98.4 F (36.9 C), temperature source Oral, resp. rate 20, height 5\' 9"  (1.753 m), weight 94.8 kg (208 lb 14.4 oz), SpO2 96 %. Constitutional: He is oriented to person, place, and time. He appears well-developed and well-nourished. No distress.  Mildly obese  HENT:  Head: Atraumatic.  Eyes: Conjunctivae  are normal.  Neck: Neck supple. No JVD present. No thyromegaly present.  Cardiovascular: Normal rate and regular rhythm.  Murmur heard.  Harsh crescendo-decrescendo midsystolic murmur is present with a grade of 3/6 at the upper right sternal border radiating to the neck. Pulses:      Carotid pulses are on the right side with bruit, and on the left side with bruit. S1 normal and S2 muffled, no gallop  Pulmonary/Chest: Effort normal and breath sounds normal. No respiratory distress. He has no rales (bilateral bases).  Abdominal: Soft. Bowel sounds are normal.  Musculoskeletal: Normal range of motion. He exhibits no edema.  Neurological: He is alert and oriented to person, place, and time.  Skin: Skin is warm and dry.  Psychiatric: He has a normal mood and affect.     Disposition: Discharge disposition: 01-Home or Self Care       Discharge Instructions    Diet -  low sodium heart healthy   Complete by:  As directed    Increase activity slowly   Complete by:  As directed      Allergies as of 03/05/2018   No Known Allergies     Medication List    STOP taking these medications   naproxen sodium 220 MG tablet Commonly known as:  ALEVE   oxyCODONE-acetaminophen 5-325 MG tablet Commonly known as:  PERCOCET/ROXICET     TAKE these medications   acetaminophen 325 MG tablet Commonly known as:  TYLENOL Take 1 tablet (325 mg total) by mouth every 4 (four) hours as needed (headache, pain).   amiodarone 200 MG tablet Commonly known as:  PACERONE Take 1 tablet (200 mg total) by mouth 3 (three) times daily with meals.   amLODIPine-Valsartan-HCTZ 10-160-12.5 MG Tabs Take 1 tablet by mouth every morning.   amoxicillin-clavulanate 500-125 MG tablet Commonly known as:  AUGMENTIN Take 1 tablet (500 mg total) by mouth 3 (three) times daily.   apixaban 5 MG Tabs tablet Commonly known as:  ELIQUIS Take 1 tablet (5 mg total) by mouth 2 (two) times daily.   aspirin EC 81 MG  tablet Take 324 mg by mouth once. What changed:  Another medication with the same name was removed. Continue taking this medication, and follow the directions you see here.   atorvastatin 80 MG tablet Commonly known as:  LIPITOR Take 1 tablet (80 mg total) by mouth daily at 6 PM.   colesevelam 625 MG tablet Commonly known as:  WELCHOL Take 1,875 mg by mouth daily.   donepezil 10 MG tablet Commonly known as:  ARICEPT Take 10 mg by mouth every morning.   esomeprazole 40 MG capsule Commonly known as:  NEXIUM Take 40 mg by mouth daily as needed (acid reflux).   ezetimibe-simvastatin 10-80 MG tablet Commonly known as:  VYTORIN Take 1 tablet by mouth every morning.   ferrous sulfate 325 (65 FE) MG EC tablet Take 325 mg by mouth 2 (two) times a week.   fluticasone 50 MCG/ACT nasal spray Commonly known as:  FLONASE Place 1 spray into both nostrils daily as needed for allergies or rhinitis.   LANTUS SOLOSTAR 100 UNIT/ML injection Generic drug:  insulin glargine Inject 20-40 Units into the skin at bedtime as needed (sugar levels).   metoprolol tartrate 25 MG tablet Commonly known as:  LOPRESSOR Take 0.5 tablets (12.5 mg total) by mouth 2 (two) times daily.   nabumetone 750 MG tablet Commonly known as:  RELAFEN Take 750 mg by mouth 2 (two) times daily as needed for mild pain.   nitroGLYCERIN 0.4 MG SL tablet Commonly known as:  NITROSTAT Place 1 tablet (0.4 mg total) under the tongue every 5 (five) minutes as needed for chest pain.   omega-3 acid ethyl esters 1 g capsule Commonly known as:  LOVAZA Take 2 g by mouth daily.   pseudoephedrine 30 MG tablet Commonly known as:  SUDAFED Take 30 mg by mouth every 4 (four) hours as needed for congestion.   rOPINIRole 2 MG tablet Commonly known as:  REQUIP Take 1-2 mg by mouth See admin instructions. Take 1 mg if restless leg isn't very bad. Take 2 mg daily for restless leg.   tamsulosin 0.4 MG Caps capsule Commonly known as:   FLOMAX Take 0.4 mg by mouth daily after supper.   tiZANidine 2 MG tablet Commonly known as:  ZANAFLEX Take 1 tablet (2 mg total) by mouth every 6 (six) hours as needed. What  changed:  reasons to take this   vitamin C 1000 MG tablet Take 1,000 mg by mouth 2 (two) times a week.      Follow-up Information    Adrian Prows, MD Follow up on 03/12/2018.   Specialty:  Cardiology Why:  8:30 AM Contact information: [place] [address] [phone]           Signed: Nigel Mormon 03/05/2018, 10:53 AM  Nigel Mormon, MD [facility] [place] Pager: [phone] Office: [phone] If no answer Cell [phone]

## 2018-03-05 NOTE — Care Management Important Message (Signed)
Important Message  Patient Details  Name: Cody Phelps MRN: [id number] Date of Birth: 06/09/1941   Medicare Important Message Given:  Yes    [name] P Rhet Rorke 03/05/2018, 3:27 PM

## 2018-03-06 LAB — CULTURE, BLOOD (ROUTINE X 2)
Culture: NO GROWTH
Culture: NO GROWTH

## 2018-03-08 ENCOUNTER — Other Ambulatory Visit: Payer: Self-pay | Admitting: Endocrinology

## 2018-03-08 ENCOUNTER — Ambulatory Visit
Admission: RE | Admit: 2018-03-08 | Discharge: 2018-03-08 | Disposition: A | Discharge status: Home / Self Care | Payer: Medicare Other | Source: Ambulatory Visit | Attending: Endocrinology | Admitting: Endocrinology

## 2018-03-08 DIAGNOSIS — R51 Headache: Principal | ICD-10-CM

## 2018-03-08 DIAGNOSIS — I350 Nonrheumatic aortic (valve) stenosis: Secondary | ICD-10-CM

## 2018-03-08 DIAGNOSIS — R519 Headache, unspecified: Secondary | ICD-10-CM

## 2018-03-13 ENCOUNTER — Other Ambulatory Visit: Payer: Self-pay | Admitting: Endocrinology

## 2018-03-15 ENCOUNTER — Other Ambulatory Visit: Payer: Self-pay

## 2018-03-15 ENCOUNTER — Inpatient Hospital Stay (HOSPITAL_COMMUNITY)
Admission: EM | Admit: 2018-03-15 | Discharge: 2018-03-20 | DRG: 280 | Disposition: A | Discharge status: Home / Self Care | Payer: Medicare Other | Attending: Cardiology | Admitting: Cardiology

## 2018-03-15 ENCOUNTER — Encounter (HOSPITAL_COMMUNITY): Payer: Self-pay | Admitting: *Deleted

## 2018-03-15 ENCOUNTER — Emergency Department (HOSPITAL_COMMUNITY): Payer: Medicare Other

## 2018-03-15 DIAGNOSIS — N400 Enlarged prostate without lower urinary tract symptoms: Secondary | ICD-10-CM | POA: Diagnosis present

## 2018-03-15 DIAGNOSIS — I110 Hypertensive heart disease with heart failure: Principal | ICD-10-CM | POA: Diagnosis present

## 2018-03-15 DIAGNOSIS — J189 Pneumonia, unspecified organism: Secondary | ICD-10-CM | POA: Diagnosis present

## 2018-03-15 DIAGNOSIS — Z823 Family history of stroke: Secondary | ICD-10-CM | POA: Diagnosis not present

## 2018-03-15 DIAGNOSIS — Z96641 Presence of right artificial hip joint: Secondary | ICD-10-CM | POA: Diagnosis present

## 2018-03-15 DIAGNOSIS — I700 Atherosclerosis of aorta: Secondary | ICD-10-CM | POA: Diagnosis present

## 2018-03-15 DIAGNOSIS — Z8249 Family history of ischemic heart disease and other diseases of the circulatory system: Secondary | ICD-10-CM

## 2018-03-15 DIAGNOSIS — I481 Persistent atrial fibrillation: Secondary | ICD-10-CM | POA: Diagnosis present

## 2018-03-15 DIAGNOSIS — I5041 Acute combined systolic (congestive) and diastolic (congestive) heart failure: Secondary | ICD-10-CM | POA: Diagnosis present

## 2018-03-15 DIAGNOSIS — E782 Mixed hyperlipidemia: Secondary | ICD-10-CM | POA: Diagnosis present

## 2018-03-15 DIAGNOSIS — I2510 Atherosclerotic heart disease of native coronary artery without angina pectoris: Secondary | ICD-10-CM | POA: Diagnosis present

## 2018-03-15 DIAGNOSIS — R0602 Shortness of breath: Secondary | ICD-10-CM | POA: Diagnosis present

## 2018-03-15 DIAGNOSIS — I480 Paroxysmal atrial fibrillation: Secondary | ICD-10-CM | POA: Diagnosis present

## 2018-03-15 DIAGNOSIS — Z87891 Personal history of nicotine dependence: Secondary | ICD-10-CM

## 2018-03-15 DIAGNOSIS — I5043 Acute on chronic combined systolic (congestive) and diastolic (congestive) heart failure: Secondary | ICD-10-CM | POA: Diagnosis present

## 2018-03-15 DIAGNOSIS — Z7901 Long term (current) use of anticoagulants: Secondary | ICD-10-CM | POA: Diagnosis not present

## 2018-03-15 DIAGNOSIS — Z8546 Personal history of malignant neoplasm of prostate: Secondary | ICD-10-CM

## 2018-03-15 DIAGNOSIS — Z9841 Cataract extraction status, right eye: Secondary | ICD-10-CM

## 2018-03-15 DIAGNOSIS — D500 Iron deficiency anemia secondary to blood loss (chronic): Secondary | ICD-10-CM | POA: Diagnosis present

## 2018-03-15 DIAGNOSIS — E669 Obesity, unspecified: Secondary | ICD-10-CM | POA: Diagnosis present

## 2018-03-15 DIAGNOSIS — I350 Nonrheumatic aortic (valve) stenosis: Secondary | ICD-10-CM | POA: Diagnosis present

## 2018-03-15 DIAGNOSIS — Z972 Presence of dental prosthetic device (complete) (partial): Secondary | ICD-10-CM

## 2018-03-15 DIAGNOSIS — I2109 ST elevation (STEMI) myocardial infarction involving other coronary artery of anterior wall: Secondary | ICD-10-CM | POA: Diagnosis present

## 2018-03-15 DIAGNOSIS — R0989 Other specified symptoms and signs involving the circulatory and respiratory systems: Secondary | ICD-10-CM | POA: Diagnosis not present

## 2018-03-15 DIAGNOSIS — Z961 Presence of intraocular lens: Secondary | ICD-10-CM | POA: Diagnosis present

## 2018-03-15 DIAGNOSIS — I3139 Other pericardial effusion (noninflammatory): Secondary | ICD-10-CM

## 2018-03-15 DIAGNOSIS — I241 Dressler's syndrome: Secondary | ICD-10-CM | POA: Diagnosis present

## 2018-03-15 DIAGNOSIS — I313 Pericardial effusion (noninflammatory): Secondary | ICD-10-CM | POA: Diagnosis present

## 2018-03-15 DIAGNOSIS — K5730 Diverticulosis of large intestine without perforation or abscess without bleeding: Secondary | ICD-10-CM | POA: Diagnosis present

## 2018-03-15 DIAGNOSIS — Z7982 Long term (current) use of aspirin: Secondary | ICD-10-CM

## 2018-03-15 DIAGNOSIS — D509 Iron deficiency anemia, unspecified: Secondary | ICD-10-CM

## 2018-03-15 DIAGNOSIS — I4892 Unspecified atrial flutter: Secondary | ICD-10-CM | POA: Diagnosis present

## 2018-03-15 DIAGNOSIS — E1165 Type 2 diabetes mellitus with hyperglycemia: Secondary | ICD-10-CM | POA: Diagnosis present

## 2018-03-15 DIAGNOSIS — J9811 Atelectasis: Secondary | ICD-10-CM | POA: Diagnosis present

## 2018-03-15 DIAGNOSIS — Z6831 Body mass index (BMI) 31.0-31.9, adult: Secondary | ICD-10-CM

## 2018-03-15 DIAGNOSIS — Z9842 Cataract extraction status, left eye: Secondary | ICD-10-CM

## 2018-03-15 DIAGNOSIS — G4733 Obstructive sleep apnea (adult) (pediatric): Secondary | ICD-10-CM | POA: Diagnosis present

## 2018-03-15 DIAGNOSIS — I509 Heart failure, unspecified: Secondary | ICD-10-CM

## 2018-03-15 DIAGNOSIS — Z9049 Acquired absence of other specified parts of digestive tract: Secondary | ICD-10-CM

## 2018-03-15 DIAGNOSIS — G2581 Restless legs syndrome: Secondary | ICD-10-CM | POA: Diagnosis present

## 2018-03-15 HISTORY — DX: Acute combined systolic (congestive) and diastolic (congestive) heart failure: I50.41

## 2018-03-15 HISTORY — DX: Nonrheumatic aortic (valve) stenosis: I35.0

## 2018-03-15 HISTORY — DX: Paroxysmal atrial fibrillation: I48.0

## 2018-03-15 LAB — COMPREHENSIVE METABOLIC PANEL
ALT: 25 U/L (ref 0–44)
AST: 20 U/L (ref 15–41)
Albumin: 3.1 g/dL — ABNORMAL LOW (ref 3.5–5.0)
Alkaline Phosphatase: 97 U/L (ref 38–126)
Anion gap: 10 (ref 5–15)
BUN: 20 mg/dL (ref 8–23)
CO2: 22 mmol/L (ref 22–32)
Calcium: 8.7 mg/dL — ABNORMAL LOW (ref 8.9–10.3)
Chloride: 105 mmol/L (ref 98–111)
Creatinine, Ser: 1.05 mg/dL (ref 0.61–1.24)
GFR calc Af Amer: >60 mL/min (ref >60)
GFR calc non Af Amer: >60 mL/min (ref >60)
Glucose, Bld: 135 mg/dL — ABNORMAL HIGH (ref 70–99)
Potassium: 4.2 mmol/L (ref 3.5–5.1)
Sodium: 137 mmol/L (ref 135–145)
Total Bilirubin: 0.6 mg/dL (ref 0.3–1.2)
Total Protein: 6.4 g/dL — ABNORMAL LOW (ref 6.5–8.1)

## 2018-03-15 LAB — CBC
HCT: 26.7 % — ABNORMAL LOW (ref 39.0–52.0)
Hemoglobin: 8.2 g/dL — ABNORMAL LOW (ref 13.0–17.0)
MCH: 27.2 pg (ref 26.0–34.0)
MCHC: 30.7 g/dL (ref 30.0–36.0)
MCV: 88.7 fL (ref 78.0–100.0)
Platelets: 373 10*3/uL (ref 150–400)
RBC: 3.01 MIL/uL — ABNORMAL LOW (ref 4.22–5.81)
RDW: 14.9 % (ref 11.5–15.5)
WBC: 11.4 10*3/uL — ABNORMAL HIGH (ref 4.0–10.5)

## 2018-03-15 LAB — TYPE AND SCREEN
ABO/RH(D): A POS
Antibody Screen: NEGATIVE

## 2018-03-15 LAB — LACTIC ACID, PLASMA
Lactic Acid, Venous: 1.7 mmol/L (ref 0.5–1.9)
Lactic Acid, Venous: 0.9 mmol/L (ref 0.5–1.9)

## 2018-03-15 LAB — BRAIN NATRIURETIC PEPTIDE: B Natriuretic Peptide: 276 pg/mL — ABNORMAL HIGH (ref 0.0–100.0)

## 2018-03-15 LAB — I-STAT TROPONIN, ED: Troponin i, poc: 0.54 ng/mL (ref 0.00–0.08)

## 2018-03-15 LAB — MRSA PCR SCREENING: MRSA by PCR: NEGATIVE

## 2018-03-15 LAB — TROPONIN I: Troponin I: 0.66 ng/mL (ref <0.03)

## 2018-03-15 LAB — GLUCOSE, CAPILLARY: Glucose-Capillary: 107 mg/dL — ABNORMAL HIGH (ref 70–99)

## 2018-03-15 MED ORDER — OMEGA-3-ACID ETHYL ESTERS 1 G PO CAPS
2.0000 g | ORAL_CAPSULE | Freq: Every day | ORAL | Status: DC
  Administered 2018-03-16 – 2018-03-20 (×5): 2 g via ORAL
  Filled 2018-03-15 (×5): qty 2

## 2018-03-15 MED ORDER — ZOLPIDEM TARTRATE 5 MG PO TABS: 5.0000 mg | ORAL_TABLET | Freq: Every evening | ORAL | Status: DC | PRN

## 2018-03-15 MED ORDER — ROPINIROLE HCL 1 MG PO TABS
2.0000 mg | ORAL_TABLET | Freq: Every day | ORAL | Status: DC
  Administered 2018-03-15 – 2018-03-19 (×5): 2 mg via ORAL
  Filled 2018-03-15 (×4): qty 2

## 2018-03-15 MED ORDER — MEMANTINE HCL 5 MG PO TABS
5.0000 mg | ORAL_TABLET | Freq: Two times a day (BID) | ORAL | Status: DC
  Administered 2018-03-15 – 2018-03-20 (×10): 5 mg via ORAL
  Filled 2018-03-15 (×10): qty 1

## 2018-03-15 MED ORDER — SODIUM CHLORIDE 0.9 % IV SOLN: 250.0000 mL | INTRAVENOUS | Status: DC | PRN

## 2018-03-15 MED ORDER — GUAIFENESIN-CODEINE 100-10 MG/5ML PO SOLN
10.0000 mL | ORAL | Status: DC | PRN
  Administered 2018-03-15 – 2018-03-20 (×15): 10 mL via ORAL
  Filled 2018-03-15 (×16): qty 10

## 2018-03-15 MED ORDER — APIXABAN 5 MG PO TABS
5.0000 mg | ORAL_TABLET | Freq: Two times a day (BID) | ORAL | Status: DC
Start: 2018-03-15 — End: 2018-03-20
  Administered 2018-03-15 – 2018-03-20 (×10): 5 mg via ORAL
  Filled 2018-03-15 (×10): qty 1

## 2018-03-15 MED ORDER — FUROSEMIDE 10 MG/ML IJ SOLN
40.0000 mg | Freq: Two times a day (BID) | INTRAMUSCULAR | Status: DC
  Administered 2018-03-15 – 2018-03-17 (×5): 40 mg via INTRAVENOUS
  Filled 2018-03-15 (×5): qty 4

## 2018-03-15 MED ORDER — SPIRONOLACTONE 12.5 MG HALF TABLET
12.5000 mg | ORAL_TABLET | Freq: Every day | ORAL | Status: DC
  Administered 2018-03-16 – 2018-03-20 (×5): 12.5 mg via ORAL
  Filled 2018-03-15 (×5): qty 1

## 2018-03-15 MED ORDER — ONDANSETRON HCL 4 MG/2ML IJ SOLN
4.0000 mg | Freq: Four times a day (QID) | INTRAMUSCULAR | Status: DC | PRN
Start: 2018-03-15 — End: 2018-03-20

## 2018-03-15 MED ORDER — PANTOPRAZOLE SODIUM 40 MG PO TBEC
40.0000 mg | DELAYED_RELEASE_TABLET | Freq: Every day | ORAL | Status: DC
  Administered 2018-03-16 – 2018-03-20 (×5): 40 mg via ORAL
  Filled 2018-03-15 (×5): qty 1

## 2018-03-15 MED ORDER — NABUMETONE 500 MG PO TABS
750.0000 mg | ORAL_TABLET | Freq: Two times a day (BID) | ORAL | Status: DC | PRN
  Filled 2018-03-15: qty 2

## 2018-03-15 MED ORDER — ACETAMINOPHEN 325 MG PO TABS: 325.0000 mg | ORAL_TABLET | ORAL | Status: DC | PRN

## 2018-03-15 MED ORDER — COLESEVELAM HCL 625 MG PO TABS
1875.0000 mg | ORAL_TABLET | Freq: Every day | ORAL | Status: DC
  Administered 2018-03-16 – 2018-03-20 (×5): 1875 mg via ORAL
  Filled 2018-03-15 (×5): qty 3

## 2018-03-15 MED ORDER — AMIODARONE HCL 200 MG PO TABS
200.0000 mg | ORAL_TABLET | Freq: Every day | ORAL | Status: DC
  Administered 2018-03-16 – 2018-03-20 (×5): 200 mg via ORAL
  Filled 2018-03-15 (×5): qty 1

## 2018-03-15 MED ORDER — MIRABEGRON ER 25 MG PO TB24
25.0000 mg | ORAL_TABLET | Freq: Every day | ORAL | Status: DC
  Administered 2018-03-15 – 2018-03-19 (×5): 25 mg via ORAL
  Filled 2018-03-15 (×4): qty 1

## 2018-03-15 MED ORDER — ATORVASTATIN CALCIUM 80 MG PO TABS
80.0000 mg | ORAL_TABLET | Freq: Every evening | ORAL | Status: DC
  Administered 2018-03-15 – 2018-03-19 (×5): 80 mg via ORAL
  Filled 2018-03-15 (×4): qty 1

## 2018-03-15 MED ORDER — DONEPEZIL HCL 10 MG PO TABS
10.0000 mg | ORAL_TABLET | Freq: Every morning | ORAL | Status: DC
  Administered 2018-03-16 – 2018-03-20 (×5): 10 mg via ORAL
  Filled 2018-03-15 (×5): qty 1

## 2018-03-15 MED ORDER — AMOXICILLIN-POT CLAVULANATE 500-125 MG PO TABS: 500.0000 mg | ORAL_TABLET | Freq: Three times a day (TID) | ORAL | Status: DC

## 2018-03-15 MED ORDER — SODIUM CHLORIDE 0.9% FLUSH: 3.0000 mL | INTRAVENOUS | Status: DC | PRN

## 2018-03-15 MED ORDER — INSULIN GLARGINE 100 UNIT/ML ~~LOC~~ SOLN
20.0000 [IU] | Freq: Every evening | SUBCUTANEOUS | Status: DC | PRN
  Filled 2018-03-15: qty 0.2

## 2018-03-15 MED ORDER — ASPIRIN EC 81 MG PO TBEC
81.0000 mg | DELAYED_RELEASE_TABLET | Freq: Every day | ORAL | Status: DC
  Administered 2018-03-15 – 2018-03-20 (×6): 81 mg via ORAL
  Filled 2018-03-15 (×5): qty 1

## 2018-03-15 MED ORDER — TAMSULOSIN HCL 0.4 MG PO CAPS
0.4000 mg | ORAL_CAPSULE | Freq: Every day | ORAL | Status: DC
  Administered 2018-03-16 – 2018-03-19 (×4): 0.4 mg via ORAL
  Filled 2018-03-15 (×4): qty 1

## 2018-03-15 MED ORDER — SODIUM CHLORIDE 0.9% FLUSH
3.0000 mL | Freq: Two times a day (BID) | INTRAVENOUS | Status: DC
  Administered 2018-03-15 – 2018-03-20 (×10): 3 mL via INTRAVENOUS

## 2018-03-15 MED ORDER — FLUTICASONE PROPIONATE 50 MCG/ACT NA SUSP
1.0000 | Freq: Every day | NASAL | Status: DC | PRN
  Filled 2018-03-15: qty 16

## 2018-03-15 MED ORDER — IRBESARTAN 150 MG PO TABS
150.0000 mg | ORAL_TABLET | Freq: Every day | ORAL | Status: DC
  Administered 2018-03-16: 150 mg via ORAL
  Filled 2018-03-15: qty 1

## 2018-03-15 MED ORDER — TIZANIDINE HCL 4 MG PO TABS: 2.0000 mg | ORAL_TABLET | Freq: Four times a day (QID) | ORAL | Status: DC | PRN

## 2018-03-15 MED ORDER — ROPINIROLE HCL 1 MG PO TABS
1.0000 mg | ORAL_TABLET | Freq: Every day | ORAL | Status: DC | PRN
  Administered 2018-03-17 – 2018-03-19 (×3): 1 mg via ORAL
  Filled 2018-03-15 (×2): qty 1
  Filled 2018-03-15: qty 2
  Filled 2018-03-15: qty 1

## 2018-03-15 MED ORDER — NITROGLYCERIN 0.4 MG SL SUBL
0.4000 mg | SUBLINGUAL_TABLET | SUBLINGUAL | Status: DC | PRN
  Administered 2018-03-18: 0.4 mg via SUBLINGUAL
  Filled 2018-03-15 (×2): qty 1

## 2018-03-15 MED ORDER — METOPROLOL TARTRATE 25 MG PO TABS
25.0000 mg | ORAL_TABLET | Freq: Two times a day (BID) | ORAL | Status: DC
  Administered 2018-03-15 – 2018-03-17 (×5): 25 mg via ORAL
  Filled 2018-03-15 (×5): qty 1

## 2018-03-15 NOTE — ED Notes (Signed)
Very evident murmur.

## 2018-03-15 NOTE — ED Notes (Signed)
Dr. Francia Greaves aware of elevated troponin.

## 2018-03-15 NOTE — H&P (Addendum)
Cody Phelps is an 77 y.o. male.   Chief Complaint: Cough and shortness of breath HPI: Cody Phelps  is a 77 y.o. 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  He was also found to have severe aortic stenosis with a mean gradient of 53 mmHg during angiography and 40 mmHg with echocardiogram, controlled diabetes mellitus, obstructive sleep apnea on CPAP, hyperlipidemia, hypertension, who developed paroxysmal atrial fibrillation after his acute myocardial infarction presentation now on amiodarone was discharged on 03/05/2018 and I seen him in the office about 3 to 4 days ago.  During hospital admission he also had a temperature of 101 F, blood cultures were negative, he was also treated for PNA.  Since discharge she has not had any further episodes of fever or chills, but cough has persisted.  No hemoptysis, no leg edema.  Continues to have marked generalized weakness and fatigue and shortness of breath.  Yesterday after he took a shower, felt he was going to pass out, felt extremely fatigued and tired and dyspneic, was seen by his PCP for his routine visit and he looked very pale, hemoglobin was 7.0 g, thought that he was having pneumonia and was sent via EMS to the emergency room.  In the emergency room he was found to be much more comfortable, no significant change in his hemoglobin, chest x-ray revealed slight increased vascularity and mild pulmonary edema and he was admitted for further evaluation.  He continues to have severe chest pain especially when he coughs, also continues to have abdominal pain with cough.  He has had a normal bowel movement yesterday.  No further chills no fever.  Feels markedly fatigued.   Past Medical History:  Diagnosis Date  . Aortic valve stenosis, moderate cardiologist-- Keanna Tugwell   last echo 01-11-2017 by dr [name]-- moderate AV  leaflet restricted w/  moderate AV stenosis, AVA 1.16cm^2  . Benign localized prostatic hyperplasia with lower urinary tract symptoms (LUTS)   . Carotid stenosis, left followed by dr [name], cardiologist--- bilateral bruit per note   per dr [name] note [id number]/3300  LICA 76-22% stenosis per duplex 05-14-2014  . Diverticulosis   . ED (erectile dysfunction)   . First degree heart block   . GERD (gastroesophageal reflux disease)   . Heart murmur   . History of pneumonia 08-22-2016 & 09-01-2016  . Hypertension    cardiologsit-  dr [name]  . Iron deficiency anemia   . Mixed hyperlipidemia   . Neuromuscular disorder ([name])    restless lrg  . OA (osteoarthritis)    right hip  . OSA on CPAP followed by dr dohmeier   per study's 02-08-2005 & 10-29-2008  severe osa uses cpap  . Prostate cancer [facility]) urologist-  dr budzyn/  oncologist-  dr Tammi Klippel   dx 03-17-2017 (bx)  Stage T2a, Gleason 4+4, PSA  4.43, vol 32.32--  scheduled for radiatctive prostate seed implants 07-10-2017 then IMRT and ADT  . RLS (restless legs syndrome)   . Scoliosis   . Trigger finger   . Type 2 diabetes mellitus ([place])   . Wears partial dentures    upper and lower    Past Surgical History:  Procedure Laterality Date  . APPENDECTOMY  1988  . BILATERAL TOTAL ETHMOIDECTOMY AND SPHENOIDECTOMY  01-14-2009    dr bates   [facility]  . CARPAL TUNNEL RELEASE Bilateral 2013  . CATARACT EXTRACTION  W/ INTRAOCULAR LENS  IMPLANT, BILATERAL  date?  . CORONARY BALLOON ANGIOPLASTY N/A 03/01/2018   Procedure: CORONARY BALLOON ANGIOPLASTY;  Surgeon: Adrian Prows, MD;  Location: [place] [facility];  Service: Cardiovascular;  Laterality: N/A;  . CORONARY/GRAFT ACUTE MI REVASCULARIZATION N/A 03/01/2018   Procedure: Coronary/Graft Acute MI Revascularization;  Surgeon: Adrian Prows, MD;  Location: [place] [facility];  Service: Cardiovascular;  Laterality: N/A;  . CYSTOSCOPY  07/19/2017   Procedure: CYSTOSCOPY;  Surgeon: Nickie Retort, MD;  Location:  [facility];  Service: Urology;;  No seeds found in [place]  . LAMINECTOMY WITH POSTERIOR LATERAL ARTHRODESIS LEVEL 2 N/A 02/12/2014   Procedure: LUMBAR TWO-THREE,LUIMBAR THREE-FOUR LAMINECTOMY/FORAMINOTOMY;POSSIBLE POSTEROLATERAL ARTHRODESIS WITH AUTOGRAFT;  Surgeon: [name], MD;  Location: [facility];  Service: Neurosurgery;  Laterality: N/A;  . LEFT HEART CATH AND CORONARY ANGIOGRAPHY N/A 03/01/2018   Procedure: LEFT HEART CATH AND CORONARY ANGIOGRAPHY;  Surgeon: Adrian Prows, MD;  Location: [place] [facility];  Service: Cardiovascular;  Laterality: N/A;  . ORIF RIGHT ANKLE FX'S  05/05/2001   retained hardware  . PROSTATE BIOPSY  03-17-2017   dr Pilar Jarvis office  . RADIOACTIVE SEED IMPLANT N/A 07/19/2017   Procedure: RADIOACTIVE SEED IMPLANT/BRACHYTHERAPY IMPLANT;  Surgeon: Nickie Retort, MD;  Location: [facility];  Service: Urology;  Laterality: N/A;  69 seeds implanted  . SPACE OAR INSTILLATION N/A 07/19/2017   Procedure: SPACE OAR INSTILLATION;  Surgeon: Nickie Retort, MD;  Location: [facility];  Service: Urology;  Laterality: N/A;  . TEE WITHOUT CARDIOVERSION  03/20/2012   Procedure: TRANSESOPHAGEAL ECHOCARDIOGRAM (TEE);  Surgeon: Laverda Page, MD;  Location: [facility];  Service: Cardiovascular;  Laterality: N/A;  normal LV; normal EF; normal RV; normal LA w/ left atrial appendage very small, normal function, interatrial septum intact without defect; normal RA; trace MR,TR, & PI; mild AV calcification and senile degeneration w/ mild stenosis, AVA 1.7cm^2;;   . TOTAL HIP ARTHROPLASTY Right 10/23/2017   Procedure: TOTAL HIP ARTHROPLASTY ANTERIOR APPROACH;  Surgeon: Frederik Pear, MD;  Location: [place];  Service: Orthopedics;  Laterality: Right;  . TRANSTHORACIC ECHOCARDIOGRAM  01-11-2017   dr [name]  (per echo note, no significant change in seveity of AS, no other diagnostic change)   moderate concentric LVH, ef 64%, grade 1  diastolic dysfunction/  moderate LAE/  mild , grade 1 AR w/ moderate AV calcification, mild to moderate restricted AV leaflets w/ moderate AS, AVA 1.16cm^2, peak grandient 693mHg, mean grandient 322mg/  trace MR, mild calcification MV annulus , mild MV leaflet calcification, mild MVS, peak grandient 4.93m106m, mean grandient 2.7mm793m trace TR    Family History  Problem Relation Age of Onset  . Stroke Mother   . Hypertension Mother   . Heart attack Father   . Heart murmur Father   . Hypertension Sister    Social History:  reports that he quit smoking about 51 years ago. His smoking use included cigarettes. He quit after 1.00 year of use. He has never used smokeless tobacco. He reports that he drinks alcohol. He reports that he does not use drugs.  Allergies: No Known Allergies  Review of Systems  Constitutional: Positive for malaise/fatigue. Negative for chills, diaphoresis, fever and weight loss.  HENT: Negative.   Eyes: Negative.   Respiratory: Positive for cough and shortness of breath. Negative for hemoptysis, sputum production and wheezing.   Cardiovascular: Positive for chest pain (on cough), orthopnea and leg swelling (chronic and mild,  stable). Negative for palpitations and claudication.  Gastrointestinal: Positive for abdominal pain (with cough). Negative for blood in stool, constipation, heartburn, melena, nausea and vomiting.  Genitourinary: Negative.   Musculoskeletal: Negative.   Skin: Negative.   Neurological: Negative.   Endo/Heme/Allergies: Negative.   Psychiatric/Behavioral: Positive for depression (mildly depressed since MI).    Blood pressure (!) 121/55, pulse 72, temperature 98.5 F (36.9 C), temperature source Oral, resp. rate (!) 22, height 5' 9"  (1.753 m), weight 98.9 kg, SpO2 97 %. Body mass index is 32.19 kg/m. Physical Exam  Constitutional: He is oriented to person, place, and time. He appears well-developed and well-nourished. No distress.  Mildly obese   Eyes: Conjunctivae are normal.  Neck: Neck supple. No JVD present. No thyromegaly present.  Cardiovascular: Normal rate and intact distal pulses. An irregular rhythm present. Exam reveals no S3.  Murmur (Crescendo decrescendo 3/6 in the right sternal border, conducted to the carotids.) heard. Pulmonary/Chest: Effort normal. He has rales (Faint bibasilar crackles heard, right slightly worse.). He exhibits no tenderness.  Abdominal: Soft. Bowel sounds are normal. He exhibits no distension. There is no tenderness.  Musculoskeletal: Normal range of motion. He exhibits no edema or deformity.  Neurological: He is alert and oriented to person, place, and time.  Skin: Skin is warm and dry.  Psychiatric: He has a normal mood and affect.    Results for orders placed or performed during the hospital encounter of 03/15/18 (from the past 48 hour(s))  CBC     Status: Abnormal   Collection Time: 03/15/18  5:53 PM  Result Value Ref Range   WBC 11.4 (H) 4.0 - 10.5 K/uL   RBC 3.01 (L) 4.22 - 5.81 MIL/uL   Hemoglobin 8.2 (L) 13.0 - 17.0 g/dL   HCT 26.7 (L) 39.0 - 52.0 %   MCV 88.7 78.0 - 100.0 fL   MCH 27.2 26.0 - 34.0 pg   MCHC 30.7 30.0 - 36.0 g/dL   RDW 14.9 11.5 - 15.5 %   Platelets 373 150 - 400 K/uL    Comment: Performed at [facility], [place] [address] [place], [place] [address]  Troponin I     Status: Abnormal   Collection Time: 03/15/18  5:53 PM  Result Value Ref Range   Troponin I 0.66 (HH) <0.03 ng/mL    Comment: CRITICAL RESULT CALLED TO, READ BACK BY AND VERIFIED WITH: A. [facility] RN [id number] GREEN R Performed at [facility] [address], [place], [place] [address]   Comprehensive metabolic panel     Status: Abnormal   Collection Time: 03/15/18  5:53 PM  Result Value Ref Range   Sodium 137 135 - 145 mmol/L   Potassium 4.2 3.5 - 5.1 mmol/L   Chloride 105 98 - 111 mmol/L   CO2 22 22 - 32 mmol/L   Glucose, Bld 135 (H) 70 - 99 mg/dL   BUN 20 8 - 23 mg/dL    Creatinine, Ser 1.05 0.61 - 1.24 mg/dL   Calcium 8.7 (L) 8.9 - 10.3 mg/dL   Total Protein 6.4 (L) 6.5 - 8.1 g/dL   Albumin 3.1 (L) 3.5 - 5.0 g/dL   AST 20 15 - 41 U/L   ALT 25 0 - 44 U/L   Alkaline Phosphatase 97 38 - 126 U/L   Total Bilirubin 0.6 0.3 - 1.2 mg/dL   GFR calc non Af Amer >60 >60 mL/min   GFR calc Af Amer >60 >60 mL/min    Comment: (  NOTE) The eGFR has been calculated using the CKD EPI equation. This calculation has not been validated in all clinical situations. eGFR's persistently <60 mL/min signify possible Chronic Kidney Disease.    Anion gap 10 5 - 15    Comment: Performed at [facility] [address] [place], [place] [address]  Brain natriuretic peptide     Status: Abnormal   Collection Time: 03/15/18  5:53 PM  Result Value Ref Range   B Natriuretic Peptide 276.0 (H) 0.0 - 100.0 pg/mL    Comment: Performed at [facility] [address] [place], [place] [address]  Lactic acid, plasma     Status: None   Collection Time: 03/15/18  5:53 PM  Result Value Ref Range   Lactic Acid, Venous 1.7 0.5 - 1.9 mmol/L    Comment: Performed at [facility] [address], [place], [place] [address]  I-stat troponin, ED     Status: Abnormal   Collection Time: 03/15/18  6:09 PM  Result Value Ref Range   Troponin i, poc 0.54 (HH) 0.00 - 0.08 ng/mL   Comment NOTIFIED PHYSICIAN    Comment 3            Comment: Due to the release kinetics of cTnI, a negative result within the first hours of the onset of symptoms does not rule out myocardial infarction with certainty. If myocardial infarction is still suspected, repeat the test at appropriate intervals.     Labs:   Lab Results  Component Value Date   WBC 11.4 (H) 03/15/2018   HGB 8.2 (L) 03/15/2018   HCT 26.7 (L) 03/15/2018   MCV 88.7 03/15/2018   PLT 373 03/15/2018   Recent Labs  Lab 03/15/18 1753  NA 137  K 4.2  CL 105  CO2 22  BUN 20  CREATININE 1.05  CALCIUM 8.7*  PROT 6.4*   BILITOT 0.6  ALKPHOS 97  ALT 25  AST 20  GLUCOSE 135*    Lipid Panel     Component Value Date/Time   CHOL 151 03/01/2018 0514   TRIG 69 03/01/2018 0514   HDL 55 03/01/2018 0514   CHOLHDL 2.7 03/01/2018 0514   VLDL 14 03/01/2018 0514   LDLCALC 82 03/01/2018 0514    BNP (last 3 results) Recent Labs    03/15/18 1753  BNP 276.0*    HEMOGLOBIN A1C Lab Results  Component Value Date   HGBA1C 6.4 (H) 03/01/2018   MPG 136.98 03/01/2018    Cardiac Panel (last 3 results) Recent Labs    03/01/18 1244 03/01/18 1936 03/15/18 1753  TROPONINI 26.12* 22.81* 0.66*     Current Facility-Administered Medications:  .  0.9 %  sodium chloride infusion, 250 mL, Intravenous, PRN, Adrian Prows, MD .  acetaminophen (TYLENOL) tablet 325 mg, 325 mg, Oral, Q4H PRN, Adrian Prows, MD .  amiodarone (PACERONE) tablet 200 mg, 200 mg, Oral, Daily, Adrian Prows, MD .  amoxicillin-clavulanate (AUGMENTIN) 500-125 MG per tablet 500 mg, 500 mg, Oral, TID, Adrian Prows, MD .  apixaban (ELIQUIS) tablet 5 mg, 5 mg, Oral, BID, Adrian Prows, MD .  aspirin EC tablet 81 mg, 81 mg, Oral, Daily, Adrian Prows, MD .  atorvastatin (LIPITOR) tablet 80 mg, 80 mg, Oral, QPM, Adrian Prows, MD .  colesevelam [facility]) tablet 1,875 mg, 1,875 mg, Oral, Daily, Adrian Prows, MD .  Derrill Memo ON 03/16/2018] donepezil (ARICEPT) tablet 10 mg, 10 mg, Oral, q morning - 10a, Adrian Prows, MD .  fluticasone (  FLONASE) 50 MCG/ACT nasal spray 1 spray, 1 spray, Each Nare, Daily PRN, Adrian Prows, MD .  furosemide (LASIX) injection 40 mg, 40 mg, Intravenous, Q12H, Adrian Prows, MD .  guaiFENesin-codeine 100-10 MG/5ML solution 10 mL, 10 mL, Oral, Q4H PRN, Adrian Prows, MD .  insulin glargine (LANTUS) injection 20-40 Units, 20-40 Units, Subcutaneous, QHS PRN, Adrian Prows, MD .  irbesartan (AVAPRO) tablet 150 mg, 150 mg, Oral, Daily, Adrian Prows, MD .  memantine [facility]) tablet 5 mg, 5 mg, Oral, BID, Adrian Prows, MD .  metoprolol tartrate (LOPRESSOR) tablet 25 mg,  25 mg, Oral, BID, Adrian Prows, MD .  nabumetone (RELAFEN) tablet 750 mg, 750 mg, Oral, BID PRN, Adrian Prows, MD .  nitroGLYCERIN (NITROSTAT) SL tablet 0.4 mg, 0.4 mg, Sublingual, Q5 min PRN, Adrian Prows, MD .  omega-3 acid ethyl esters (LOVAZA) capsule 2 g, 2 g, Oral, Daily, Adrian Prows, MD .  ondansetron (ZOFRAN) injection 4 mg, 4 mg, Intravenous, Q6H PRN, Adrian Prows, MD .  pantoprazole (PROTONIX) EC tablet 40 mg, 40 mg, Oral, Daily, Adrian Prows, MD .  rOPINIRole (REQUIP) tablet 1-2 mg, 1-2 mg, Oral, See admin instructions, Adrian Prows, MD .  sodium chloride flush (NS) 0.9 % injection 3 mL, 3 mL, Intravenous, Q12H, Adrian Prows, MD .  sodium chloride flush (NS) 0.9 % injection 3 mL, 3 mL, Intravenous, PRN, Adrian Prows, MD .  spironolactone (ALDACTONE) tablet 12.5 mg, 12.5 mg, Oral, Daily, Adrian Prows, MD .  Derrill Memo ON 03/16/2018] tamsulosin (FLOMAX) capsule 0.4 mg, 0.4 mg, Oral, QPC supper, Adrian Prows, MD .  tiZANidine (ZANAFLEX) tablet 2 mg, 2 mg, Oral, Q6H PRN, Adrian Prows, MD .  zolpidem (AMBIEN) tablet 5 mg, 5 mg, Oral, QHS PRN, Adrian Prows, MD  Current Outpatient Medications:  .  acetaminophen (TYLENOL) 325 MG tablet, Take 1 tablet (325 mg total) by mouth every 4 (four) hours as needed (headache, pain). (Patient taking differently: Take 325 mg by mouth every 4 (four) hours as needed (headaches or pain). ), Disp: 60 tablet, Rfl: 3 .  amiodarone (PACERONE) 200 MG tablet, Take 1 tablet (200 mg total) by mouth 3 (three) times daily with meals. (Patient taking differently: Take 200 mg by mouth daily. ), Disp: 30 tablet, Rfl: 0 .  apixaban (ELIQUIS) 5 MG TABS tablet, Take 1 tablet (5 mg total) by mouth 2 (two) times daily., Disp: 60 tablet, Rfl: 3 .  atorvastatin (LIPITOR) 80 MG tablet, Take 1 tablet (80 mg total) by mouth daily at 6 PM. (Patient taking differently: Take 80 mg by mouth every evening. ), Disp: 60 tablet, Rfl: 3 .  colesevelam (WELCHOL) 625 MG tablet, Take 1,875 mg by mouth daily. , Disp: , Rfl:   .  donepezil (ARICEPT) 10 MG tablet, Take 10 mg by mouth every morning. , Disp: , Rfl:  .  esomeprazole (NEXIUM) 40 MG capsule, Take 40 mg by mouth daily as needed (acid reflux). , Disp: , Rfl:  .  fluticasone (FLONASE) 50 MCG/ACT nasal spray, Place 1 spray into both nostrils daily as needed for allergies or rhinitis., Disp: , Rfl:  .  insulin glargine (LANTUS SOLOSTAR) 100 UNIT/ML injection, Inject 20-40 Units into the skin at bedtime as needed (for BGL 150 or greater). , Disp: , Rfl:  .  memantine (NAMENDA) 5 MG tablet, Take 5 mg by mouth 2 (two) times daily., Disp: , Rfl:  .  metoprolol tartrate (LOPRESSOR) 25 MG tablet, Take 0.5 tablets (12.5 mg total) by mouth 2 (two) times daily. (  Patient taking differently: Take 25 mg by mouth 2 (two) times daily. ), Disp: 60 tablet, Rfl: 3 .  nabumetone (RELAFEN) 750 MG tablet, Take 750 mg by mouth 2 (two) times daily as needed for mild pain., Disp: , Rfl:  .  nitroGLYCERIN (NITROSTAT) 0.4 MG SL tablet, Place 1 tablet (0.4 mg total) under the tongue every 5 (five) minutes as needed for chest pain., Disp: 30 tablet, Rfl: 3 .  omega-3 acid ethyl esters (LOVAZA) 1 G capsule, Take 2 g by mouth daily. , Disp: , Rfl:  .  rOPINIRole (REQUIP) 2 MG tablet, Take 1-2 mg by mouth See admin instructions. Take 2 mg by mouth at bedtime and also 1-2 mg once during the day as needed for restless legs, Disp: , Rfl:  .  SYMBICORT 160-4.5 MCG/ACT inhaler, Inhale 2 puffs into the lungs 2 (two) times daily., Disp: , Rfl:  .  tamsulosin (FLOMAX) 0.4 MG CAPS capsule, Take 0.4 mg by mouth daily after supper. , Disp: , Rfl:  .  valsartan (DIOVAN) 160 MG tablet, Take 160 mg by mouth daily., Disp: , Rfl:  .  amoxicillin-clavulanate (AUGMENTIN) 500-125 MG tablet, Take 1 tablet (500 mg total) by mouth 3 (three) times daily. (Patient not taking: Reported on 03/15/2018), Disp: 15 tablet, Rfl: 0 .  aspirin EC 81 MG tablet, Take 81 mg by mouth daily. , Disp: , Rfl:  .  tiZANidine (ZANAFLEX) 2  MG tablet, Take 1 tablet (2 mg total) by mouth every 6 (six) hours as needed. (Patient not taking: Reported on 03/15/2018), Disp: 60 tablet, Rfl: 0  CARDIAC STUDIES:  Coronary angiogram 03/20/2018: Left main mildly calcified but normal, LAD large caliber vessel, mild to moderate diffuse disease followed by occlusion in the mid to distal segment, distally the LAD appears to be diffusely diseased with TIMI I flow.  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  D1 is small, D-2 is moderate to  large sized with tandem 80 to 90% stenosis, mild to moderate calcification.  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 RCA small, non-dominant and diffusely diseased without high-grade stenosis. Severe aortic stenosis with peak to peak gradient of 69 and mean gradient of 53 mmHg.  Severely calcified mitral annulus and moderately calcified aortic valve.  EKG 03/15/2018: Atrial fibrillation with controlled ventricular response at the rate of 64 bpm, inferior infarct old, anteroseptal infarct old.  Nonspecific T abnormality.  Normal QT interval.  ECHO: 03/02/2018 Left ventricle: The cavity size was mildly reduced. There was moderate concentric hypertrophy. Systolic function was at the lower limits of normal. The estimated ejection fraction was in the range of 45% to 50%. Severe hypokinesis of the apicalanterior, anterolateral, inferoseptal, and apical myocardium. The study is not technically sufficient to allow evaluation of LV diastolic function due to severe mitral apparatus calcification. However suggests grade 2 diastolic dysfunction with elevated LA and LVEDP. - Aortic valve: Normal-sized, mildly calcified annulus. Probably trileaflet; moderately calcified leaflets. There was severe stenosis. Mean gradient (S): 37 mm Hg. Peak gradient  (S): 78 mm Hg. Valve area (VTI): 0.72 cm^2. Valve area (Vmax): 0.8 cm^2. Valve area (Vmean): 0.83 cm^2. - Mitral valve: Normal-sized, moderately calcified annulus. Moderately calcified leaflets . The findings are consistent with mild stenosis. Mean gradient (D): 8 mm Hg. Valve area by continuity equation (using LVOT flow): 1.32 cm^2. - Left  atrium: The atrium was mildly dilated.  Assessment/Plan 1. Acute on chronic diastolic heart failure 2. Persistent dry cough, may be related to pharyngeal irritation from persistent cough when he was admitted with acute MI and pneumonia, no further fever, nonproductive, improved with codeine syrup. 3. Severe aortic stenosis 4. Diabetes mellitus type 2 controlled with hyperglycemia 5.  Hyperlipidemia, mixed 6.  Mild obesity 7.  Obstructive sleep apnea on CPAP and compliant 8.  Chronic persistent dry cough 9.  Persistent atrial fibrillation presently on aspirin 81 mg and Eliquis 5 mg twice daily CHA2DS2-VASCScore: Risk Score  4,  Yearly risk of stroke  4.   Recommendation: We will admit the patient for gentle diuresis.  I am not very sure of his presentation, hemoglobin is stable, renal function is stable, I suspect the fatigue is probably related to persistent atrial fibrillation and also mild heart failure associated with lack of sleep from chronic dry cough for the past 3 weeks. I will have TAVR team to visit the patient. I have met with the family and had extensive discussion regarding his condition, stable Hb, no obvious infection. I will discontinue antibiotics.   With regard to atrial fibrillation, he is rate controlled, I do not think cardioversion will help his condition for now and he would maintain sinus rhythm.  Adrian Prows, MD 03/15/2018, 8:52 PM [facility] [place] Pager: [phone] Office: [phone] If no answer: Cell:  [phone]

## 2018-03-15 NOTE — ED Provider Notes (Signed)
[place] EMERGENCY DEPARTMENT Provider Note   CSN: [id number] Arrival date & time: 03/15/18  1635     History   Chief Complaint Chief Complaint  Patient presents with  . Shortness of Breath    HPI Cody Phelps is a 77 y.o. male.  77 year old male with prior history as detailed below presents with complaint of shortness of breath.  Patient reports increasing cough and shortness of breath over the last 1 week.  Patient recently completed a course of Augmentin for suspected CAP.  Patient denies recent fever.  He was recently admitted for a MI.  He denies current chest pain.  He denies fever today.  He complains of persistent cough without significant production of sputum.  The history is provided by the patient, medical records and the spouse.  Shortness of Breath  This is a new problem. The average episode lasts 2 months. The problem occurs continuously.The current episode started more than 1 week ago. The problem has not changed since onset.Associated symptoms include cough. Pertinent negatives include no fever, no sputum production, no chest pain and no leg pain.    Past Medical History:  Diagnosis Date  . Aortic valve stenosis, moderate cardiologist-- ganji   last echo 01-11-2017 by dr [name]-- moderate AV leaflet restricted w/  moderate AV stenosis, AVA 1.16cm^2  . Benign localized prostatic hyperplasia with lower urinary tract symptoms (LUTS)   . Carotid stenosis, left followed by dr [name], cardiologist--- bilateral bruit per note   per dr [name] note 75/1700  LICA 17-49% stenosis per duplex 05-14-2014  . Diverticulosis   . ED (erectile dysfunction)   . First degree heart block   . GERD (gastroesophageal reflux disease)   . Heart murmur   . History of pneumonia 08-22-2016 & 09-01-2016  . Hypertension    cardiologsit-  dr [name]  . Iron deficiency anemia   . Mixed hyperlipidemia   . Neuromuscular disorder ([name])    restless lrg  . OA (osteoarthritis)      right hip  . OSA on CPAP followed by dr dohmeier   per study's 02-08-2005 & 10-29-2008  severe osa uses cpap  . Prostate cancer [facility]) urologist-  dr budzyn/  oncologist-  dr Tammi Klippel   dx 03-17-2017 (bx)  Stage T2a, Gleason 4+4, PSA  4.43, vol 32.32--  scheduled for radiatctive prostate seed implants 07-10-2017 then IMRT and ADT  . RLS (restless legs syndrome)   . Scoliosis   . Trigger finger   . Type 2 diabetes mellitus ([place])   . Wears partial dentures    upper and lower    Patient Active Problem List   Diagnosis Date Noted  . Paroxysmal atrial fibrillation ([place]) 03/05/2018  . Acute anterior wall MI ([place]) 03/01/2018  . Aortic stenosis, moderate 03/01/2018  . Acute MI anterior wall first episode care ([place]) 03/01/2018  . Primary osteoarthritis of right hip 10/23/2017  . Osteoarthritis of right hip 10/18/2017  . Malignant neoplasm of prostate ([place]) 04/21/2017  . RLS (restless legs syndrome) 11/20/2014  . Lumbar stenosis with neurogenic claudication 02/12/2014  . Obstructive sleep apnea 11/25/2013  . DM (diabetes mellitus) type 2, uncontrolled, with ketoacidosis ([place]) 11/25/2013  . HTN (hypertension) 11/25/2013  . Bronchitis, acute 05/04/2012  . Lymphadenopathy 05/04/2012  . OTHER CHRONIC SINUSITIS 08/08/2008  . RHINITIS 08/05/2008  . SLEEP APNEA 08/05/2008  . FOOT PAIN, RIGHT 06/10/2008  . DIABETES MELLITUS, TYPE II 08/31/2006  . HYPERTENSION 08/31/2006  . DIVERTICULOSIS, COLON 08/31/2006  . LEG  CRAMPS 08/31/2006    Past Surgical History:  Procedure Laterality Date  . APPENDECTOMY  1988  . BILATERAL TOTAL ETHMOIDECTOMY AND SPHENOIDECTOMY  01-14-2009    dr bates   [facility]  . CARPAL TUNNEL RELEASE Bilateral 2013  . CATARACT EXTRACTION W/ INTRAOCULAR LENS  IMPLANT, BILATERAL  date?  . CORONARY BALLOON ANGIOPLASTY N/A 03/01/2018   Procedure: CORONARY BALLOON ANGIOPLASTY;  Surgeon: Adrian Prows, MD;  Location: [place] CV LAB;  Service: Cardiovascular;  Laterality: N/A;  .  CORONARY/GRAFT ACUTE MI REVASCULARIZATION N/A 03/01/2018   Procedure: Coronary/Graft Acute MI Revascularization;  Surgeon: Adrian Prows, MD;  Location: [place] CV LAB;  Service: Cardiovascular;  Laterality: N/A;  . CYSTOSCOPY  07/19/2017   Procedure: CYSTOSCOPY;  Surgeon: Nickie Retort, MD;  Location: [facility];  Service: Urology;;  No seeds found in [place]  . LAMINECTOMY WITH POSTERIOR LATERAL ARTHRODESIS LEVEL 2 N/A 02/12/2014   Procedure: LUMBAR TWO-THREE,LUIMBAR THREE-FOUR LAMINECTOMY/FORAMINOTOMY;POSSIBLE POSTEROLATERAL ARTHRODESIS WITH AUTOGRAFT;  Surgeon: [name], MD;  Location: [facility];  Service: Neurosurgery;  Laterality: N/A;  . LEFT HEART CATH AND CORONARY ANGIOGRAPHY N/A 03/01/2018   Procedure: LEFT HEART CATH AND CORONARY ANGIOGRAPHY;  Surgeon: Adrian Prows, MD;  Location: [place] CV LAB;  Service: Cardiovascular;  Laterality: N/A;  . ORIF RIGHT ANKLE FX'S  05/05/2001   retained hardware  . PROSTATE BIOPSY  03-17-2017   dr Pilar Jarvis office  . RADIOACTIVE SEED IMPLANT N/A 07/19/2017   Procedure: RADIOACTIVE SEED IMPLANT/BRACHYTHERAPY IMPLANT;  Surgeon: Nickie Retort, MD;  Location: [facility];  Service: Urology;  Laterality: N/A;  69 seeds implanted  . SPACE OAR INSTILLATION N/A 07/19/2017   Procedure: SPACE OAR INSTILLATION;  Surgeon: Nickie Retort, MD;  Location: [facility];  Service: Urology;  Laterality: N/A;  . TEE WITHOUT CARDIOVERSION  03/20/2012   Procedure: TRANSESOPHAGEAL ECHOCARDIOGRAM (TEE);  Surgeon: Laverda Page, MD;  Location: [facility]COPY;  Service: Cardiovascular;  Laterality: N/A;  normal LV; normal EF; normal RV; normal LA w/ left atrial appendage very small, normal function, interatrial septum intact without defect; normal RA; trace MR,TR, & PI; mild AV calcification and senile degeneration w/ mild stenosis, AVA 1.7cm^2;;   . TOTAL HIP ARTHROPLASTY Right 10/23/2017   Procedure: TOTAL HIP  ARTHROPLASTY ANTERIOR APPROACH;  Surgeon: Frederik Pear, MD;  Location: [place];  Service: Orthopedics;  Laterality: Right;  . TRANSTHORACIC ECHOCARDIOGRAM  01-11-2017   dr [name]  (per echo note, no significant change in seveity of AS, no other diagnostic change)   moderate concentric LVH, ef 19%, grade 1 diastolic dysfunction/  moderate LAE/  mild , grade 1 AR w/ moderate AV calcification, mild to moderate restricted AV leaflets w/ moderate AS, AVA 1.16cm^2, peak grandient 56mmHg, mean grandient 39mmHg/  trace MR, mild calcification MV annulus , mild MV leaflet calcification, mild MVS, peak grandient 4.31mmHg, mean grandient 2.44mmHg/ trace TR        Home Medications    Prior to Admission medications   Medication Sig Start Date End Date Taking? Authorizing Provider  acetaminophen (TYLENOL) 325 MG tablet Take 1 tablet (325 mg total) by mouth every 4 (four) hours as needed (headache, pain). 03/05/18   Patwardhan, [name], MD  amiodarone (PACERONE) 200 MG tablet Take 1 tablet (200 mg total) by mouth 3 (three) times daily with meals. 03/05/18   Patwardhan, [name], MD  Amlodipine-Valsartan-HCTZ 10-160-12.5 MG TABS Take 1 tablet by mouth every morning.  11/07/13   [provider]  amoxicillin-clavulanate (AUGMENTIN) 500-125 MG tablet Take 1 tablet (500 mg total) by mouth 3 (three) times daily. 03/05/18   Patwardhan, [name], MD  apixaban (ELIQUIS) 5 MG TABS tablet Take 1 tablet (5 mg total) by mouth 2 (two) times daily. 03/05/18   Patwardhan, [name], MD  Ascorbic Acid (VITAMIN C) 1000 MG tablet Take 1,000 mg by mouth 2 (two) times a week.     [provider]  aspirin EC 81 MG tablet Take 324 mg by mouth once.     [provider]  atorvastatin (LIPITOR) 80 MG tablet Take 1 tablet (80 mg total) by mouth daily at 6 PM. 03/05/18   Patwardhan, [name], MD  colesevelam (WELCHOL) 625 MG tablet Take 1,875 mg by mouth daily.     [provider]  donepezil (ARICEPT) 10 MG tablet Take  10 mg by mouth every morning.  03/19/12   [provider]  esomeprazole (NEXIUM) 40 MG capsule Take 40 mg by mouth daily as needed (acid reflux).     [provider]  ezetimibe-simvastatin (VYTORIN) 10-80 MG per tablet Take 1 tablet by mouth every morning.     [provider]  ferrous sulfate 325 (65 FE) MG EC tablet Take 325 mg by mouth 2 (two) times a week.     [provider]  fluticasone (FLONASE) 50 MCG/ACT nasal spray Place 1 spray into both nostrils daily as needed for allergies or rhinitis.    [provider]  insulin glargine (LANTUS SOLOSTAR) 100 UNIT/ML injection Inject 20-40 Units into the skin at bedtime as needed (sugar levels).     [provider]  metoprolol tartrate (LOPRESSOR) 25 MG tablet Take 0.5 tablets (12.5 mg total) by mouth 2 (two) times daily. 03/05/18 03/05/19  Patwardhan, [name], MD  nabumetone (RELAFEN) 750 MG tablet Take 750 mg by mouth 2 (two) times daily as needed for mild pain.    [provider]  nitroGLYCERIN (NITROSTAT) 0.4 MG SL tablet Place 1 tablet (0.4 mg total) under the tongue every 5 (five) minutes as needed for chest pain. 03/05/18   Patwardhan, [name], MD  omega-3 acid ethyl esters (LOVAZA) 1 G capsule Take 2 g by mouth daily.     [provider]  pseudoephedrine (SUDAFED) 30 MG tablet Take 30 mg by mouth every 4 (four) hours as needed for congestion.    [provider]  rOPINIRole (REQUIP) 2 MG tablet Take 1-2 mg by mouth See admin instructions. Take 1 mg if restless leg isn't very bad. Take 2 mg daily for restless leg.    [provider]  tamsulosin (FLOMAX) 0.4 MG CAPS capsule Take 0.4 mg by mouth daily after supper.     [provider]  tiZANidine (ZANAFLEX) 2 MG tablet Take 1 tablet (2 mg total) by mouth every 6 (six) hours as needed. Patient taking differently: Take 2 mg by mouth every 6 (six) hours as needed for muscle spasms.  10/23/17   Leighton Parody,  PA-C    Family History Family History  Problem Relation Age of Onset  . Stroke Mother   . Hypertension Mother   . Heart attack Father   . Heart murmur Father   . Hypertension Sister     Social History Social History   Tobacco Use  . Smoking status: Former Smoker    Years: 1.00    Types: Cigarettes    Last attempt to quit: 02/04/1967    Years since quitting:  51.1  . Smokeless tobacco: Never Used  . Tobacco comment: smoked 1 q2-3 days  Substance Use Topics  . Alcohol use: Yes    Comment: occas.  . Drug use: No     Allergies   Patient has no known allergies.   Review of Systems Review of Systems  Constitutional: Negative for fever.  Respiratory: Positive for cough and shortness of breath. Negative for sputum production.   Cardiovascular: Negative for chest pain.  All other systems reviewed and are negative.    Physical Exam Updated Vital Signs BP (!) 141/76   Pulse 73   Temp 98.5 F (36.9 C) (Oral)   Resp (!) 23   Ht 5\' 9"  (1.753 m)   Wt 98.9 kg   SpO2 97%   BMI 32.19 kg/m   Physical Exam  Constitutional: He is oriented to person, place, and time. He appears well-developed and well-nourished. No distress.  HENT:  Head: Normocephalic and atraumatic.  Mouth/Throat: Oropharynx is clear and moist.  Eyes: Pupils are equal, round, and reactive to light. Conjunctivae and EOM are normal.  Neck: Normal range of motion. Neck supple.  Cardiovascular: Normal rate, regular rhythm and normal heart sounds.  Pulmonary/Chest: Effort normal. No respiratory distress.  Decreased breath sounds at bilateral bases  Abdominal: Soft. He exhibits no distension. There is no tenderness.  Musculoskeletal: Normal range of motion. He exhibits no edema or deformity.  Neurological: He is alert and oriented to person, place, and time.  Skin: Skin is warm and dry.  Psychiatric: He has a normal mood and affect.  Nursing note and vitals reviewed.    ED Treatments / Results   Labs (all labs ordered are listed, but only abnormal results are displayed) Labs Reviewed  CBC - Abnormal; Notable for the following components:      Result Value   WBC 11.4 (*)    RBC 3.01 (*)    Hemoglobin 8.2 (*)    HCT 26.7 (*)    All other components within normal limits  COMPREHENSIVE METABOLIC PANEL - Abnormal; Notable for the following components:   Glucose, Bld 135 (*)    Calcium 8.7 (*)    Total Protein 6.4 (*)    Albumin 3.1 (*)    All other components within normal limits  BRAIN NATRIURETIC PEPTIDE - Abnormal; Notable for the following components:   B Natriuretic Peptide 276.0 (*)    All other components within normal limits  I-STAT TROPONIN, ED - Abnormal; Notable for the following components:   Troponin i, poc 0.54 (*)    All other components within normal limits  CULTURE, BLOOD (ROUTINE X 2)  CULTURE, BLOOD (ROUTINE X 2)  LACTIC ACID, PLASMA  TROPONIN I  LACTIC ACID, PLASMA  TYPE AND SCREEN    EKG EKG Interpretation  Date/Time:  Thursday March 15 2018 16:50:19 EDT Ventricular Rate:  64 PR Interval:    QRS Duration: 84 QT Interval:  481 QTC Calculation: 497 R Axis:   -16 Text Interpretation:  Atrial fibrillation Inferior infarct, old Probable anterolateral infarct, recent Confirmed by Dene Gentry [phone]) on 03/15/2018 4:55:26 PM   Radiology Dg Chest 2 View  Result Date: 03/15/2018 CLINICAL DATA:  Short of breath EXAM: CHEST - 2 VIEW COMPARISON:  03/02/2018, 03/01/2018, 10/11/2017 FINDINGS: Mild cardiomegaly. Small left greater than right pleural effusions. Vascular congestion and mild pulmonary edema. No pneumothorax. IMPRESSION: Cardiomegaly with vascular congestion, mild pulmonary edema and small left greater than right pleural effusions. Findings are worsened as compared with 03/02/2018.  Electronically Signed   By: Donavan Foil M.D.   On: 03/15/2018 18:20    Procedures Procedures (including critical care time)  Medications Ordered in  ED Medications - No data to display   Initial Impression / Assessment and Plan / ED Course  I have reviewed the triage vital signs and the nursing notes.  Pertinent labs & imaging results that were available during my care of the patient were reviewed by me and considered in my medical decision making (see chart for details).     MDM  Screen complete  Patient is presenting for evaluation of shortness of breath.  Patient's presentation and workup is most consistent with likely CHF exacerbation.  Patient will be admitted to Dr. Irven Shelling service.  He is aware of the case and will admit the patient for further work-up and treatment.  Final Clinical Impressions(s) / ED Diagnoses   Final diagnoses:  SOB (shortness of breath)  Acute on chronic congestive heart failure, unspecified heart failure type [facility])    ED Discharge Orders    None       Valarie Merino, MD 03/15/18 2025

## 2018-03-15 NOTE — ED Notes (Signed)
Pt alert and  Oriented skin warm and dry  No distress

## 2018-03-15 NOTE — ED Triage Notes (Signed)
The pt arrived by [facility] from a doctors office  He went there today with some sob  He was found  To have bi-lateral pneumonia and increasing chf.  Mi 2 weeks ago  He is on eliquist  Some abd discomfort from coughing

## 2018-03-16 ENCOUNTER — Encounter (HOSPITAL_COMMUNITY): Payer: Self-pay | Admitting: Physician Assistant

## 2018-03-16 ENCOUNTER — Other Ambulatory Visit: Payer: Self-pay

## 2018-03-16 DIAGNOSIS — I350 Nonrheumatic aortic (valve) stenosis: Secondary | ICD-10-CM

## 2018-03-16 LAB — BASIC METABOLIC PANEL
Anion gap: 12 (ref 5–15)
BUN: 20 mg/dL (ref 8–23)
CO2: 22 mmol/L (ref 22–32)
Calcium: 8.7 mg/dL — ABNORMAL LOW (ref 8.9–10.3)
Chloride: 104 mmol/L (ref 98–111)
Creatinine, Ser: 1.05 mg/dL (ref 0.61–1.24)
GFR calc Af Amer: >60 mL/min (ref >60)
GFR calc non Af Amer: >60 mL/min (ref >60)
Glucose, Bld: 127 mg/dL — ABNORMAL HIGH (ref 70–99)
Potassium: 4.1 mmol/L (ref 3.5–5.1)
Sodium: 138 mmol/L (ref 135–145)

## 2018-03-16 LAB — GLUCOSE, CAPILLARY
Glucose-Capillary: 115 mg/dL — ABNORMAL HIGH (ref 70–99)
Glucose-Capillary: 138 mg/dL — ABNORMAL HIGH (ref 70–99)
Glucose-Capillary: 140 mg/dL — ABNORMAL HIGH (ref 70–99)
Glucose-Capillary: 148 mg/dL — ABNORMAL HIGH (ref 70–99)

## 2018-03-16 NOTE — Progress Notes (Signed)
I will discontinue ARB to see if cough improves

## 2018-03-16 NOTE — Evaluation (Signed)
Physical Therapy Evaluation Patient Details Name: Cody Phelps MRN: [id number] DOB: 1941/03/07 Today's Date: 03/16/2018   History of Present Illness  77 y.o. male admitted with SOB and generalized weakness. Recently discharged 8/05 after acute MI. Pt has severe aortic stenosis. PMH includes but not limited to: DM2, Prostate CA, PAF, OSA on CPAP, HLD, HTN, L carotid stenosis, R THR 3/19, R ankle ORF, coronary/graft acute MI revascularization 03/01/18.     Clinical Impression  Pt admitted with above diagnosis. Pt currently with functional limitations due to the deficits listed below (see PT Problem List). PTA, pt at home after recent admission for acute MI, living with wife. Pt has become gradually weaker and limited to short distance ambulation in home with RW. Today pt presents with decreased activity tolerance and weakness, chest pain he contributes to being sore from coughing. HR resting 65, 88 with activity. SpO2 WNL on RA. Ambulated to bathroom and around room with min guard. Rec HHPT for reconditioning while he awaits OP cardiac rehab admission.  Pt will benefit from skilled PT to increase their independence and safety with mobility to allow discharge to the venue listed below.    Follow Up Recommendations Home health PT;Supervision for mobility/OOB    Equipment Recommendations  None recommended by PT    Recommendations for Other Services OT consult     Precautions / Restrictions Precautions Precautions: Fall Restrictions Weight Bearing Restrictions: No      Mobility  Bed Mobility Overal bed mobility: Modified Independent             General bed mobility comments: increased time and effort  Transfers Overall transfer level: Needs assistance Equipment used: Rolling walker (2 wheeled) Transfers: Sit to/from Stand Sit to Stand: Min assist         General transfer comment: Min A to help power up into RW, cues for hand placement  Ambulation/Gait Ambulation/Gait  assistance: Min guard Gait Distance (Feet): 30 Feet Assistive device: Rolling walker (2 wheeled) Gait Pattern/deviations: Step-through pattern;Decreased stride length;Trunk flexed Gait velocity: decreased   General Gait Details: pt walking in room and to bathroom, feeling weak. HRmax 85, SpO2 WNL on RA  Stairs            Wheelchair Mobility    Modified Rankin (Stroke Patients Only)       Balance Overall balance assessment: Mild deficits observed, not formally tested                                           Pertinent Vitals/Pain Pain Assessment: Faces Faces Pain Scale: Hurts little more Pain Location: chest from coughing Pain Descriptors / Indicators: Discomfort(soreness) Pain Intervention(s): Limited activity within patient's tolerance;Monitored during session    [place] expects to be discharged to:: Private residence Living Arrangements: Spouse/significant other;Children Available Help at Discharge: Family;Available 24 hours/day Type of Home: House Home Access: Stairs to enter Entrance Stairs-Rails: Right Entrance Stairs-Number of Steps: 3 Home Layout: One level Home Equipment: Walker - 2 wheels;Cane - single point;Bedside commode;Shower seat - built in Additional Comments: pt has equipment from back surgery in 2015    Prior Function Level of Independence: Independent with assistive device(s)         Comments: using walker aroud home     Hand Dominance   Dominant Hand: Right    Extremity/Trunk Assessment   Upper Extremity Assessment Upper Extremity Assessment: Defer  to OT evaluation    Lower Extremity Assessment Lower Extremity Assessment: Overall WFL for tasks assessed(4-/5 gross)    Cervical / Trunk Assessment Cervical / Trunk Assessment: Normal  Communication   Communication: No difficulties  Cognition Arousal/Alertness: Awake/alert Behavior During Therapy: Flat affect Overall Cognitive Status: Within  Functional Limits for tasks assessed                                        General Comments      Exercises General Exercises - Lower Extremity Long Arc Quad: 10 reps Heel Slides: 10 reps Hip ABduction/ADduction: 10 reps Straight Leg Raises: 10 reps   Assessment/Plan    PT Assessment Patient needs continued PT services  PT Problem List Decreased activity tolerance;Decreased balance;Decreased mobility       PT Treatment Interventions DME instruction;Gait training;Stair training;Functional mobility training;Therapeutic activities;Therapeutic exercise;Balance training    PT Goals (Current goals can be found in the Care Plan section)  Acute Rehab PT Goals Patient Stated Goal: get stronger PT Goal Formulation: With patient Time For Goal Achievement: 03/23/18 Potential to Achieve Goals: Good    Frequency Min 3X/week   Barriers to discharge        Co-evaluation               AM-PAC PT "6 Clicks" Daily Activity  Outcome Measure Difficulty turning over in bed (including adjusting bedclothes, sheets and blankets)?: None Difficulty moving from lying on back to sitting on the side of the bed? : None Difficulty sitting down on and standing up from a chair with arms (e.g., wheelchair, bedside commode, etc,.)?: None Help needed moving to and from a bed to chair (including a wheelchair)?: A Little Help needed walking in hospital room?: A Little Help needed climbing 3-5 steps with a railing? : A Little 6 Click Score: 21    End of Session Equipment Utilized During Treatment: Gait belt Activity Tolerance: Patient tolerated treatment well Patient left: in bed;with family/visitor present;in chair Nurse Communication: Mobility status PT Visit Diagnosis: Unsteadiness on feet (R26.81)    Time: 1000-1030 PT Time Calculation (min) (ACUTE ONLY): 30 min   Charges:   PT Evaluation $PT Eval Moderate Complexity: 1 Mod PT Treatments $Gait Training: 8-22 mins         Reinaldo Berber, PT, DPT Acute Rehab Services Pager: [phone]   Reinaldo Berber 03/16/2018, 10:35 AM

## 2018-03-16 NOTE — Consult Note (Addendum)
[facility]  Inpatient TAVR Consultation:   Patient ID: CERVANDO DURNIN; [id number]; 07/03/1941   Admit date: 03/15/2018 Date of Consult: 03/16/2018  Primary Care Provider: [name], MD Primary Cardiologist: Dr. [name]  Patient Profile:   ANDIE MORTIMER is a 77 y.o. male with a hx of DMT2, obesity, OSA on CPAP, carotid artery disease, HTN, HLD, prostate cancer s/p hormone therapy, radioactive seed XRT and external XRT, chronic diastolic CHF, CAD with recent MI and severe aortic stenosis who is being seen today for the evaluation of severe aortic stenosis at the request of Dr. [name].  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 He lives with his wife and functions independently. He has partial dentures but regularly sees the dentist. He has known about a heart murmur for ~20 years and has followed with Dr. [name] since around 2013 for moderate aortic stenosis and carotid artery disease.   He has been retired since 2008. He used to do yard work and also likes to [employer] in his garage. He has dramatically slowed down over the past year or so due to hip arthritis. Total hip replacement replacement surgery was delayed until 09/2017 to allow him to get through radiation therapy for his prostate cancer which was diagnosed in late 2018. He was just starting to get back in to shape and had been doing water aerobics and joined the [employer],  but then had an acute myocardial infarction earlier this month.  He was admitted from 8/1-03/05/18 for chest pain and STEMI. Peak troponin 26.12. Cath showed chronic appearing mid LAD occlusion with unsuccessful revascularization attempt. Given late presentation and completed myocardial infarction with organized thrombus, medical management was recommended. During the hospital stay, he developed cough and low grade fever-which were treated as community acquired pneumonia with PO  Augmentin. He also developed paroxysmal atrial flutter/fibrilation-which converted to sinus rhythm with PO amiodarone. Given his increased risk of bleeding, aspirin 81 mg and eliquis 5 mg bid were recommended. During this admission, echocardiogram 03/02/18 showed severe AS with mean/peak gradient 37/78 mmHg and cath showed mean/peak gradient 53/69 mm Hg. He was discharged home but he continued to feel poorly.   He then represented back to [facility] ED (sent from PCPs office via EMS) for fatigue and dyspnea as well as persistent dry cough. In the ED, BNP mildly elevated ~276, CXR with pulmonary vascular congestion, Hg 8.2, WBC 11.4, troponin 0.66. ECG with rate controlled atrial fibrillation.   Dr. [name] admitted him for further work up and gentle diuresis. The multidisiplinary valve team is consulted to discuss severe aortic stenosis and possible candidacy for TAVR.   He is currently feeling a little better with diuresis. Still having persistent cough which has improved some with codeine cough syrup. He admits to some mild LE edema, abdominal distension, orthopnea and PND. He has been sleeping in an arm chair recently. He also has chest pain with exertion but mostly with coughing. The day of admission he had dizziness and pre syncope in the shower which prompted him to make an appointment with his PCP. No syncope. No palpitations.    Past Medical History:  Diagnosis Date  . Carotid stenosis, left followed by dr [name], cardiologist--- bilateral bruit per note   per dr [name] note 67/3419  LICA 37-90% stenosis per duplex 05-14-2014  . Diverticulosis   . ED (erectile dysfunction)   . First degree heart block   .  GERD (gastroesophageal reflux disease)   . Hypertension    cardiologsit-  dr [name]  . Iron deficiency anemia   . Mixed hyperlipidemia   . OA (osteoarthritis)    right hip  . OSA on CPAP    followed by dr dohmeier, uses CPAP  . Prostate cancer ([place])    dx 03-17-2017 (bx)  Stage T2a, Gleason 4+4,  PSA  4.43, vol 32.32--  s/p radiatctive prostate seed implants 07-10-2017 then IMRT and ADT  . RLS (restless legs syndrome)   . Scoliosis   . Severe aortic stenosis   . Trigger finger   . Type 2 diabetes mellitus ([place])   . Wears partial dentures    upper and lower    Past Surgical History:  Procedure Laterality Date  . APPENDECTOMY  1988  . BILATERAL TOTAL ETHMOIDECTOMY AND SPHENOIDECTOMY  01-14-2009    dr bates   [facility]  . CARPAL TUNNEL RELEASE Bilateral 2013  . CATARACT EXTRACTION W/ INTRAOCULAR LENS  IMPLANT, BILATERAL  date?  . CORONARY BALLOON ANGIOPLASTY N/A 03/01/2018   Procedure: CORONARY BALLOON ANGIOPLASTY;  Surgeon: Adrian Prows, MD;  Location: [facility];  Service: Cardiovascular;  Laterality: N/A;  . CORONARY/GRAFT ACUTE MI REVASCULARIZATION N/A 03/01/2018   Procedure: Coronary/Graft Acute MI Revascularization;  Surgeon: Adrian Prows, MD;  Location: [facility];  Service: Cardiovascular;  Laterality: N/A;  . CYSTOSCOPY  07/19/2017   Procedure: CYSTOSCOPY;  Surgeon: Nickie Retort, MD;  Location: [facility];  Service: Urology;;  No seeds found in [place]  . LAMINECTOMY WITH POSTERIOR LATERAL ARTHRODESIS LEVEL 2 N/A 02/12/2014   Procedure: LUMBAR TWO-THREE,LUIMBAR THREE-FOUR LAMINECTOMY/FORAMINOTOMY;POSSIBLE POSTEROLATERAL ARTHRODESIS WITH AUTOGRAFT;  Surgeon: [name], MD;  Location: [facility];  Service: Neurosurgery;  Laterality: N/A;  . LEFT HEART CATH AND CORONARY ANGIOGRAPHY N/A 03/01/2018   Procedure: LEFT HEART CATH AND CORONARY ANGIOGRAPHY;  Surgeon: Adrian Prows, MD;  Location: [facility];  Service: Cardiovascular;  Laterality: N/A;  . ORIF RIGHT ANKLE FX'S  05/05/2001   retained hardware  . PROSTATE BIOPSY  03-17-2017   dr Pilar Jarvis office  . RADIOACTIVE SEED IMPLANT N/A 07/19/2017   Procedure: RADIOACTIVE SEED IMPLANT/BRACHYTHERAPY IMPLANT;  Surgeon: Nickie Retort, MD;  Location: [facility];  Service:  Urology;  Laterality: N/A;  69 seeds implanted  . SPACE OAR INSTILLATION N/A 07/19/2017   Procedure: SPACE OAR INSTILLATION;  Surgeon: Nickie Retort, MD;  Location: [facility];  Service: Urology;  Laterality: N/A;  . TEE WITHOUT CARDIOVERSION  03/20/2012   Procedure: TRANSESOPHAGEAL ECHOCARDIOGRAM (TEE);  Surgeon: Laverda Page, MD;  Location: [facility];  Service: Cardiovascular;  Laterality: N/A;  normal LV; normal EF; normal RV; normal LA w/ left atrial appendage very small, normal function, interatrial septum intact without defect; normal RA; trace MR,TR, & PI; mild AV calcification and senile degeneration w/ mild stenosis, AVA 1.7cm^2;;   . TOTAL HIP ARTHROPLASTY Right 10/23/2017   Procedure: TOTAL HIP ARTHROPLASTY ANTERIOR APPROACH;  Surgeon: Frederik Pear, MD;  Location: [facility];  Service: Orthopedics;  Laterality: Right;  . TRANSTHORACIC ECHOCARDIOGRAM  01-11-2017   dr [name]  (per echo note, no significant change in seveity of AS, no other diagnostic change)   moderate concentric LVH, ef 72%, grade 1 diastolic dysfunction/  moderate LAE/  mild , grade 1 AR w/ moderate AV calcification, mild to moderate restricted AV leaflets w/ moderate AS, AVA 1.16cm^2, peak grandient 16mmHg, mean grandient 76mmHg/  trace MR,  mild calcification MV annulus , mild MV leaflet calcification, mild MVS, peak grandient 4.51mmHg, mean grandient 2.63mmHg/ trace TR     Inpatient Medications: Scheduled Meds: . amiodarone  200 mg Oral Daily  . apixaban  5 mg Oral BID  . aspirin EC  81 mg Oral Daily  . atorvastatin  80 mg Oral QPM  . colesevelam  1,875 mg Oral Daily  . donepezil  10 mg Oral q morning - 10a  . furosemide  40 mg Intravenous Q12H  . irbesartan  150 mg Oral Daily  . memantine  5 mg Oral BID  . metoprolol tartrate  25 mg Oral BID  . mirabegron ER  25 mg Oral QHS  . omega-3 acid ethyl esters  2 g Oral Daily  . pantoprazole  40 mg Oral Daily  . rOPINIRole  2 mg Oral QHS  . sodium  chloride flush  3 mL Intravenous Q12H  . spironolactone  12.5 mg Oral Daily  . tamsulosin  0.4 mg Oral QPC supper   Continuous Infusions: . sodium chloride     PRN Meds: sodium chloride, acetaminophen, fluticasone, guaiFENesin-codeine, insulin glargine, nabumetone, nitroGLYCERIN, ondansetron (ZOFRAN) IV, rOPINIRole, sodium chloride flush, zolpidem  Allergies:   No Known Allergies  Social History:   Social History   Socioeconomic History  . Marital status: Married    Spouse name: Butch Penny  . Number of children: 2  . Years of education: 55  . Highest education level: Not on file  Occupational History  . Not on file  Social Needs  . Financial resource strain: Not on file  . Food insecurity:    Worry: Not on file    Inability: Not on file  . Transportation needs:    Medical: Not on file    Non-medical: Not on file  Tobacco Use  . Smoking status: Former Smoker    Years: 1.00    Types: Cigarettes    Last attempt to quit: 02/04/1967    Years since quitting: 51.1  . Smokeless tobacco: Never Used  . Tobacco comment: smoked 1 q2-3 days  Substance and Sexual Activity  . Alcohol use: Yes    Comment: occas.  . Drug use: No  . Sexual activity: Yes  Lifestyle  . Physical activity:    Days per week: Not on file    Minutes per session: Not on file  . Stress: Not on file  Relationships  . Social connections:    Talks on phone: Not on file    Gets together: Not on file    Attends religious service: Not on file    Active member of club or organization: Not on file    Attends meetings of clubs or organizations: Not on file    Relationship status: Not on file  . Intimate partner violence:    Fear of current or ex partner: Not on file    Emotionally abused: Not on file    Physically abused: Not on file    Forced sexual activity: Not on file  Other Topics Concern  . Not on file  Social History Narrative   Patient is married Butch Penny).   Patient has two children.   Patient is  retired.   Patient has a college education.   Patient is right-handed.   Patient drinks two cups of coffee per day and 2-3 cups of Diet soda daily and limited tea.          Family History:   The patient's family history includes Heart  attack in his father; Heart murmur in his father; Hypertension in his mother and sister; Stroke in his mother.  ROS:  Please see the history of present illness.  ROS  All other ROS reviewed and negative.     Physical Exam/Data:   Vitals:   03/15/18 2150 03/16/18 0027 03/16/18 0442 03/16/18 0733  BP: 129/63 108/62 117/61 117/66  Pulse: 62 61 65 60  Resp: (!) 24 (!) 22 17   Temp: 98 F (36.7 C) 98.2 F (36.8 C) 98.3 F (36.8 C) 98.3 F (36.8 C)  TempSrc: Oral Oral Oral Oral  SpO2: 98% 95% 97% 96%  Weight: 98.2 kg     Height: 5\' 9"  (1.753 m)       Intake/Output Summary (Last 24 hours) at 03/16/2018 1001 Last data filed at 03/16/2018 0234 Gross per 24 hour  Intake -  Output 650 ml  Net -650 ml   Filed Weights   03/15/18 1643 03/15/18 2150  Weight: 98.9 kg 98.2 kg   Body mass index is 31.97 kg/m.  General:  Well nourished, well developed, in no acute distress. Overweight.  HEENT: normal Lymph: no adenopathy Neck: no JVD Endocrine:  No thryomegaly Vascular: No carotid bruits; FA pulses 2+ bilaterally. + carotid bruits  Cardiac:  normal S1, S2; irreg irreg; 4/6 SEM @ RUSB Lungs: decreased breath sounds with expiratory cough. Abd: soft, nontender, no hepatomegaly  Ext: 1+ bilateral LE edema  Musculoskeletal:  No deformities, BUE and BLE strength normal and equal Skin: warm and dry  Neuro:  CNs 2-12 intact, no focal abnormalities noted Psych:  Normal affect   EKG:  The EKG was personally reviewed and demonstrates:  Atrial fibrillation HR 64 Telemetry:  Telemetry was personally reviewed and demonstrates:  Atrial flutter, rate controlled, HR 60s.   Relevant CV Studies: Heart cath 03/01/2018 Coronary/Graft Acute MI Revascularization    CORONARY BALLOON ANGIOPLASTY  LEFT HEART CATH AND CORONARY ANGIOGRAPHY  Conclusion  Coronary angiogram 03/20/2018: Left main mildly calcified but normal, LAD large caliber vessel, mild to moderate diffuse disease followed by occlusion in the mid to distal segment, distally the LAD appears to be diffusely diseased with TIMI I flow.  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  D1 is small, D-2 is moderate to  large sized with tandem 80 to 90% stenosis, mild to moderate calcification.  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 RCA small, non-dominant and diffusely diseased without high-grade stenosis. Severe aortic stenosis with peak to peak gradient of 69 and mean gradient of 53 mmHg.  Severely calcified mitral annulus and moderately calcified aortic valve.  Recommend uninterrupted dual antiplatelet therapy with Aspirin 81mg  daily and Ticagrelor 90mg  twice daily for a minimum of 1 month (bare metal stent - Class I recommendation).  This is being done for STEMI with completed anterior wall infarct until he recovers and will be considered for either TAVR or CABG, high-grade diagonal 2 disease with aortic valve replacement.  He will need to be evaluated for possible pneumonia with pleuritic chest pain component along with a temperature of 101 F.  185 ml contrast utilized.    _______________   2D ECHO 03/02/2018 Study Conclusions - Left ventricle: The cavity size was mildly reduced. There was   moderate concentric hypertrophy. Systolic function was at the   lower limits of normal. The estimated ejection fraction  was in   the range of 45% to 50%. Severe hypokinesis of the   apicalanterior, anterolateral, inferoseptal, and apical   myocardium. The study is not technically sufficient to allow   evaluation of LV diastolic function due  to severe mitral   apparatus calcification. However suggests grade 2 diastolic   dysfunction with elevated LA and LVEDP. - Aortic valve: Normal-sized, mildly calcified annulus. Probably   trileaflet; moderately calcified leaflets. There was severe   stenosis. Mean gradient (S): 37 mm Hg. Peak gradient (S): 78 mm   Hg. Valve area (VTI): 0.72 cm^2. Valve area (Vmax): 0.8 cm^2.   Valve area (Vmean): 0.83 cm^2. - Mitral valve: Normal-sized, moderately calcified annulus.   Moderately calcified leaflets . The findings are consistent with   mild stenosis. Mean gradient (D): 8 mm Hg. Valve area by   continuity equation (using LVOT flow): 1.32 cm^2. - Left atrium: The atrium was mildly dilated.   Laboratory Data:  Chemistry Recent Labs  Lab 03/15/18 1753 03/16/18 0415  NA 137 138  K 4.2 4.1  CL 105 104  CO2 22 22  GLUCOSE 135* 127*  BUN 20 20  CREATININE 1.05 1.05  CALCIUM 8.7* 8.7*  GFRNONAA >60 >60  GFRAA >60 >60  ANIONGAP 10 12    Recent Labs  Lab 03/15/18 1753  PROT 6.4*  ALBUMIN 3.1*  AST 20  ALT 25  ALKPHOS 97  BILITOT 0.6   Hematology Recent Labs  Lab 03/15/18 1753  WBC 11.4*  RBC 3.01*  HGB 8.2*  HCT 26.7*  MCV 88.7  MCH 27.2  MCHC 30.7  RDW 14.9  PLT 373   Cardiac Enzymes Recent Labs  Lab 03/15/18 1753  TROPONINI 0.66*    Recent Labs  Lab 03/15/18 1809  TROPIPOC 0.54*    BNP Recent Labs  Lab 03/15/18 1753  BNP 276.0*    DDimer No results for input(s): DDIMER in the last 168 hours.  Radiology/Studies:  Dg Chest 2 View  Result Date: 03/15/2018 CLINICAL DATA:  Short of breath EXAM: CHEST - 2 VIEW COMPARISON:  03/02/2018, 03/01/2018, 10/11/2017 FINDINGS: Mild cardiomegaly. Small left greater than right pleural effusions. Vascular congestion and mild pulmonary edema. No pneumothorax. IMPRESSION: Cardiomegaly with vascular congestion, mild pulmonary edema and small left greater than right pleural effusions. Findings are worsened as compared  with 03/02/2018. Electronically Signed   By: Donavan Foil M.D.   On: 03/15/2018 18:20     STS Risk Calculator:  Procedure: AVR + CAB   Risk of Mortality:  4.358%   Renal Failure:  5.522%   Permanent Stroke:  2.175%   Prolonged Ventilation:  14.876%   DSW Infection:  0.436%   Reoperation:  5.839%   Morbidity or Mortality:  21.697%   Short Length of Stay:  17.164%   Long Length of Stay:  16.429%    Assessment and Plan:   FERNANDEZ KENLEY is a 77 y.o. male with symptoms of severe, stage D1 aortic stenosis with NYHA Class III symptoms. I have reviewed the patient's recent echocardiogram which is notable for mildly reduced LV systolic function (EF 81-85%) and severe aortic stenosis with peak gradient of 78 mmHg and mean transvalvular gradient of 37 mmHg. The patient's dimensionless index is 0.25 and calculated aortic valve area is 0.83 cm. Recent heart cath 03/01/18 done in the setting of STEMI showed 100% mLAD stenosis and 80% D2 stenosis with severe aortic stenosis with peak to peak gradient of 69 and mean gradient of 53 mmHg.  There was an unsuccessful revascularization attempt to LAD. Medical management was recommended.   I have reviewed the natural history of aortic stenosis with the patient. We have discussed the limitations of medical therapy and the poor prognosis associated with symptomatic aortic stenosis. We have reviewed potential treatment options, including palliative medical therapy, conventional surgical aortic valve replacement, and transcatheter aortic valve replacement. We discussed treatment options in the context of this patient's specific comorbid medical conditions.   The patient has had recent functional decline, mostly due to prostate cancer therapy and severe OA with delay of hip surgery. He was just getting stronger when he suffered an acute late presenting MI. This was treated medically after an unsuccessful revascularization attempt by Dr. [name]. He also developed  possible pneumonia and persistent atrial fib/flutter and now presents with acute heart failure with LE edema, orthopnea, PND and significant cough.   The patient's predicted risk of mortality with conventional aortic valve replacement is 4.358% primarily based on his CAD with recent acute MI, acute CHF, persistent afib/flutter, recent PNA, DMT2, anemia, prostate cancer and severe OSA on CPAP. Other significant comorbid conditions include physical deconditioning. TAVR seems like a reasonable treatment option for this patient pending formal cardiac surgical consultation. We discussed typical evaluation which will require a gated cardiac CTA and a CTA of the chest/abdomen/pelvis to evaluate both his cardiac anatomy and peripheral vasculature. We will plan to do these scans while inpatient. He may require repeat L/RHC. He will also need PFTs and carotid dopplers and PT eval.   Dr. Angelena Form to follow.   Signed, Angelena Form, PA-C  03/16/2018 10:01 AM   I have personally seen and examined this patient. I agree with the assessment and plan as outlined above.  Mr. Craton has known CAD with recent MI secondary to occlusion of the mid LAD. This was unable to be opened. His LVEF post MI is 45-50%. He was also found to have severe AS with mean gradient of 53 mmHg and peak gradient of 69 mmHg by cath. His echo shows calcified aortic valve leaflets with limited leaflet mobility c/w aortic stenosis. There is mild mitral stenosis. I have personally reviewed his echo images and cath films. He also has atrial fibrillation, sleep apnea, DM, HTN, carotid artery disease and chronic diastolic CHF. He was recently treated for prostate cancer. He is now admitted with fatigue, dyspnea, dizziness and dry cough. He is felt to be volume overloaded and is being diuresed. Labs reviewed by me. His anemic.  My exam:   General: Well developed, well nourished, NAD  HEENT: OP clear, mucus membranes moist  SKIN: warm, dry. No  rashes. Neuro: No focal deficits  Musculoskeletal: Muscle strength 5/5 all ext  Psychiatric: Mood and affect normal  Neck: No JVD, no carotid bruits, no thyromegaly, no lymphadenopathy.  Lungs:Clear bilaterally, no wheezes, rhonci, crackles Cardiovascular: Regular rate and rhythm. Loud harsh systolic murmur.  Abdomen:Soft. Bowel sounds present. Non-tender.  Extremities: 1+ bilateral lower extremity edema. Pulses are 2 + in the bilateral DP/PT.  Plan:  Severe aortic stenosis: He has severe, stage D aortic valve stenosis. I have personally reviewed the echo images. The aortic valve is thickened, calcified with limited leaflet mobility. I think he would benefit from AVR. Given advanced age and co-morbidities, he is not a good candidate for conventional AVR by surgical approach. I think he may be a good candidate for TAVR.   I have reviewed the natural history of aortic stenosis with the patient and his family  members  who are present today. We have discussed the limitations of medical therapy and the poor prognosis associated with symptomatic aortic stenosis. We have reviewed potential treatment options, including palliative medical therapy, conventional surgical aortic valve replacement, and transcatheter aortic valve replacement. We discussed treatment options in the context of the patient's specific comorbid medical conditions.    He would like to proceed with planning for TAVR. He has completed his cardiac cath. We will arrange CT scans this weekend to assess his candidacy for TAVR. We will have his scans read on Monday. He can hopefully be discharged home after diuresis and we can plan his TAVR as an outpatient. He will need carotid dopplers, PFTs and a PT assessment. We will arrange this. We will arrange a CT surgery consultation.   Lauree Chandler 03/16/2018 3:26 PM

## 2018-03-17 ENCOUNTER — Inpatient Hospital Stay (HOSPITAL_COMMUNITY): Payer: Medicare Other

## 2018-03-17 ENCOUNTER — Encounter (HOSPITAL_COMMUNITY): Payer: Medicare Other

## 2018-03-17 DIAGNOSIS — I350 Nonrheumatic aortic (valve) stenosis: Secondary | ICD-10-CM

## 2018-03-17 LAB — GLUCOSE, CAPILLARY
Glucose-Capillary: 120 mg/dL — ABNORMAL HIGH (ref 70–99)
Glucose-Capillary: 133 mg/dL — ABNORMAL HIGH (ref 70–99)
Glucose-Capillary: 161 mg/dL — ABNORMAL HIGH (ref 70–99)
Glucose-Capillary: 194 mg/dL — ABNORMAL HIGH (ref 70–99)
Glucose-Capillary: 195 mg/dL — ABNORMAL HIGH (ref 70–99)

## 2018-03-17 LAB — BASIC METABOLIC PANEL
Anion gap: 8 (ref 5–15)
BUN: 24 mg/dL — ABNORMAL HIGH (ref 8–23)
CO2: 22 mmol/L (ref 22–32)
Calcium: 8.2 mg/dL — ABNORMAL LOW (ref 8.9–10.3)
Chloride: 105 mmol/L (ref 98–111)
Creatinine, Ser: 1.11 mg/dL (ref 0.61–1.24)
GFR calc Af Amer: >60 mL/min (ref >60)
GFR calc non Af Amer: >60 mL/min (ref >60)
Glucose, Bld: 125 mg/dL — ABNORMAL HIGH (ref 70–99)
Potassium: 3.7 mmol/L (ref 3.5–5.1)
Sodium: 135 mmol/L (ref 135–145)

## 2018-03-17 LAB — RETICULOCYTES
RBC.: 3.12 MIL/uL — ABNORMAL LOW (ref 4.22–5.81)
Retic Count, Absolute: 68.6 10*3/uL (ref 19.0–186.0)
Retic Ct Pct: 2.2 % (ref 0.4–3.1)

## 2018-03-17 LAB — IRON AND TIBC
Iron: 28 ug/dL — ABNORMAL LOW (ref 45–182)
Saturation Ratios: 9 % — ABNORMAL LOW (ref 17.9–39.5)
TIBC: 297 ug/dL (ref 250–450)
UIBC: 269 ug/dL

## 2018-03-17 LAB — VITAMIN B12: Vitamin B-12: 285 pg/mL (ref 180–914)

## 2018-03-17 LAB — FERRITIN: Ferritin: 408 ng/mL — ABNORMAL HIGH (ref 24–336)

## 2018-03-17 LAB — FOLATE: Folate: 16 ng/mL (ref >5.9)

## 2018-03-17 MED ORDER — IOPAMIDOL (ISOVUE-370) INJECTION 76%
100.0000 mL | Freq: Once | INTRAVENOUS | Status: AC | PRN
  Administered 2018-03-17: 100 mL via INTRAVENOUS

## 2018-03-17 NOTE — Evaluation (Signed)
Occupational Therapy Evaluation Patient Details Name: Cody Phelps MRN: [id number] DOB: 1941-02-03 Today's Date: 03/17/2018    History of Present Illness 77 y.o. male admitted with SOB and generalized weakness. Recently discharged 8/05 after acute MI. Pt has severe aortic stenosis. PMH includes but not limited to: DM2, Prostate CA, PAF, OSA on CPAP, HLD, HTN, L carotid stenosis, R THR 3/19, R ankle ORF, coronary/graft acute MI revascularization 03/01/18.    Clinical Impression   Prior to recent MI, pt was independent with ADL and functional mobility. Since recent discharge, pt reports using cane for functional mobility and requiring some assistance for ADL and IADL due to fatigue. Pt additionally limited with RLE dressing tasks due to previous hip replacement. Pt currently presenting with significantly diminished activity tolerance for ADL participation. He demonstrates significant fatigue after limited activity standing at EOB. HR at rest 75-80 prior to activity and up to 110 with standing at EOB. Pt reporting dizziness and pain with coughing today but was able to ambulate in hallway with his wife prior to OT session. He requires min guard assist for simulated toilet transfers and mod assist for LB ADL. Pt would benefit from continued OT services while admitted to improve independence and safety with ADL and functional mobility prior to returning home with home health OT follow-up. Will continue to follow while admitted and update recommendations based on progress.    Follow Up Recommendations  Home health OT;Supervision/Assistance - 24 hour    Equipment Recommendations  None recommended by OT    Recommendations for Other Services       Precautions / Restrictions Precautions Precautions: Fall Restrictions Weight Bearing Restrictions: No RUE Weight Bearing: Weight bearing as tolerated      Mobility Bed Mobility Overal bed mobility: Modified Independent             General bed  mobility comments: increased time and effort with HOB elevated  Transfers Overall transfer level: Needs assistance Equipment used: Rolling walker (2 wheeled) Transfers: Sit to/from Stand Sit to Stand: Min guard         General transfer comment: Guarding assist for safety.     Balance Overall balance assessment: Mild deficits observed, not formally tested                                         ADL either performed or assessed with clinical judgement   ADL Overall ADL's : Needs assistance/impaired Eating/Feeding: Set up;Bed level   Grooming: Set up;Bed level   Upper Body Bathing: Min guard;Sitting   Lower Body Bathing: Moderate assistance;Sit to/from stand   Upper Body Dressing : Min guard;Sitting   Lower Body Dressing: Moderate assistance;Sit to/from stand   Toilet Transfer: Designer, television/film set Details (indicate cue type and reason): simulated at EOB with a few steps; limited by poor activity tolerance Toileting- Clothing Manipulation and Hygiene: Minimal assistance;Sit to/from stand       Functional mobility during ADLs: Min guard;Rolling walker(limited assessment) General ADL Comments: Pt had just returned from ambulation in hallway with his wife. He demonstrates significantly diminished activity tolerance for ADL participation with shortness of breath and poor ability to tolerate standing activities. Coughing and becomes dizzy with cough and with standing.      Vision Baseline Vision/History: Wears glasses Wears Glasses: Reading only Patient Visual Report: No change from baseline Vision Assessment?: No apparent visual deficits  Perception     Praxis      Pertinent Vitals/Pain Pain Assessment: Faces Faces Pain Scale: Hurts little more Pain Location: chest from coughing Pain Descriptors / Indicators: Discomfort;Sore Pain Intervention(s): Limited activity within patient's tolerance;Monitored during session;Repositioned      Hand Dominance Right   Extremity/Trunk Assessment Upper Extremity Assessment Upper Extremity Assessment: Generalized weakness   Lower Extremity Assessment Lower Extremity Assessment: Generalized weakness   Cervical / Trunk Assessment Cervical / Trunk Assessment: Normal   Communication Communication Communication: No difficulties   Cognition Arousal/Alertness: Awake/alert Behavior During Therapy: Flat affect Overall Cognitive Status: Within Functional Limits for tasks assessed                                     General Comments  Facilities staff present as beginning session due to window leaking but he stepped out prior to OT asking questions to pt/family about PLOF. Pt's wife present during session. HR on arrival 75-80. Up to 110 with standing at EOB and returned to 80 on seated rest. RR up to 30 during standing tasks.     Exercises     Shoulder Instructions      Home Living Family/patient expects to be discharged to:: Private residence Living Arrangements: Spouse/significant other;Children Available Help at Discharge: Family;Available 24 hours/day Type of Home: House Home Access: Stairs to enter [employer] of Steps: 3 Entrance Stairs-Rails: Right Home Layout: One level     Bathroom Shower/Tub: Occupational psychologist: Standard     Home Equipment: Environmental consultant - 2 wheels;Cane - single point;Bedside commode;Shower seat - built in   Additional Comments: pt has equipment from back surgery in 2015      Prior Functioning/Environment Level of Independence: Needs assistance  Gait / Transfers Assistance Needed: Using cane since MI ADL's / Homemaking Assistance Needed: Assistance for R LE dressing since hip replacement; poor activity tolerance for IADL and requiring assistance from wife   Comments: Pt reports use of cane since MI and poor tolerance for typical activities.         OT Problem List: Impaired balance (sitting and/or  standing);Decreased activity tolerance;Decreased knowledge of precautions;Cardiopulmonary status limiting activity      OT Treatment/Interventions: Self-care/ADL training;Therapeutic exercise;Energy conservation;DME and/or AE instruction;Therapeutic activities;Patient/family education;Balance training    OT Goals(Current goals can be found in the care plan section) Acute Rehab OT Goals Patient Stated Goal: get stronger OT Goal Formulation: With patient/family Time For Goal Achievement: 03/31/18 Potential to Achieve Goals: Good ADL Goals Pt Will Perform Grooming: with modified independence;standing Pt Will Perform Lower Body Dressing: with modified independence;sit to/from stand Pt Will Transfer to Toilet: with modified independence;ambulating;regular height toilet Pt Will Perform Toileting - Clothing Manipulation and hygiene: with modified independence;sit to/from stand Pt Will Perform Tub/Shower Transfer: Shower transfer;with modified independence;ambulating;rolling walker  OT Frequency: Min 2X/week   Barriers to D/C:            Co-evaluation              AM-PAC PT "6 Clicks" Daily Activity     Outcome Measure Help from another person eating meals?: None Help from another person taking care of personal grooming?: None Help from another person toileting, which includes using toliet, bedpan, or urinal?: A Little Help from another person bathing (including washing, rinsing, drying)?: A Lot Help from another person to put on and taking off regular upper body clothing?: A  Little Help from another person to put on and taking off regular lower body clothing?: A Lot 6 Click Score: 18   End of Session Equipment Utilized During Treatment: Rolling walker  Activity Tolerance: Other (comment);Patient limited by fatigue(rise in HR with minimal activity) Patient left: in bed;with call bell/phone within reach;with family/visitor present  OT Visit Diagnosis: Other abnormalities of gait  and mobility (R26.89);Muscle weakness (generalized) (M62.81)                Time: 1504-1364 OT Time Calculation (min): 17 min Charges:  OT General Charges $OT Visit: 1 Visit OT Evaluation $OT Eval Moderate Complexity: 1 Mod  Norman Herrlich, MS OTR/L  Pager: [employer] 03/17/2018, 5:08 PM

## 2018-03-17 NOTE — Progress Notes (Signed)
Paged MD Ganji per pt c/o SHOB, c/o heaviness in upper chest when breathing, difficulty breathing, sat up and clammy, diaphoretic, hypotensive SBP in 70s, returned to lying BP 125/66, ekg obtained/ resulted and communicated to Dr. [name].   Ekg repeated at rest and reported to MD.  BP 120/75 after rest, no c/o SHOB, returned to CPAP with 2l O2, pt resting compfortably.    Ordered CBC and BMP in am, bedsrest, to monitor.   Wife at bedside.

## 2018-03-17 NOTE — Progress Notes (Signed)
Subjective:   Cody Phelps  is a 77 y.o. 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  He was also found to have severe aortic stenosis with a mean gradient of 53 mmHg during angiography and 40 mmHg with echocardiogram, controlled diabetes mellitus, obstructive sleep apnea on CPAP, hyperlipidemia, hypertension, who developed paroxysmal atrial fibrillation after his acute myocardial infarction presentation now on amiodarone was discharged on 03/05/2018.    During hospital admission he also had a temperature of 101 F, blood cultures were negative, he was also treated for PNA.  Since discharge she has not had any further episodes of fever or chills, but cough has persisted.  No hemoptysis, no leg edema.  Continues to have marked generalized weakness and fatigue and shortness of breath.  He is now admitted with worsening dyspnea, fatigue and dry cough and pleuritic chest pain. No significant change in cough.   Objective:  Vital Signs in the last 24 hours: Temp:  [98.2 F (36.8 C)-99.4 F (37.4 C)] 98.2 F (36.8 C) (08/17 0743) Pulse Rate:  [62-87] 78 (08/17 0743) Resp:  [16-26] 21 (08/17 0743) BP: (90-122)/(54-66) 115/66 (08/17 0743) SpO2:  [92 %-96 %] 95 % (08/17 0743) Weight:  [92.8 kg] 92.8 kg (08/17 0500)  Intake/Output from previous day: 08/16 0701 - 08/17 0700 In: 3 [I.V.:3] Out: 800 [Urine:800]  Physical Exam: Blood pressure 115/66, pulse 78, temperature 98.2 F (36.8 C), temperature source Oral, resp. rate (!) 21, height 5\' 9"  (1.753 m), weight 92.8 kg, SpO2 95 %. Body mass index is 30.2 kg/m.  Physical Exam  Constitutional: He is oriented to person, place, and time.  He is moderately built and  mildly obese in no acute distress.  HENT:  Head: Atraumatic.  Eyes: Conjunctivae are normal.  Neck: Neck supple. No JVD present. No thyromegaly  present.  Cardiovascular:  S1 is normal, S2 is muffled.  Crescendo decrescendo 3/6 midsystolic murmur heard at the apex and right sternal border.  No gallop.  Pulmonary/Chest: Effort normal and breath sounds normal. No stridor.  Abdominal: Soft. Bowel sounds are normal.  Musculoskeletal: Normal range of motion. He exhibits no edema.  Neurological: He is alert and oriented to person, place, and time.  Skin: Skin is warm and dry.  Psychiatric: He has a normal mood and affect.   Lab Results: BMP Recent Labs    03/15/18 1753 03/16/18 0415 03/17/18 0454  NA 137 138 135  K 4.2 4.1 3.7  CL 105 104 105  CO2 22 22 22   GLUCOSE 135* 127* 125*  BUN 20 20 24*  CREATININE 1.05 1.05 1.11  CALCIUM 8.7* 8.7* 8.2*  GFRNONAA >60 >60 >60  GFRAA >60 >60 >60    CBC Recent Labs  Lab 03/15/18 1753  WBC 11.4*  RBC 3.01*  HGB 8.2*  HCT 26.7*  PLT 373  MCV 88.7  MCH 27.2  MCHC 30.7  RDW 14.9    HEMOGLOBIN A1C Lab Results  Component Value Date   HGBA1C 6.4 (H) 03/01/2018   MPG 136.98 03/01/2018    Cardiac Panel (last 3 results) Recent Labs    03/01/18 1244 03/01/18 1936 03/15/18 1753  TROPONINI 26.12* 22.81* 0.66*   Lipid Panel     Component Value Date/Time   CHOL 151 03/01/2018 0514   TRIG 69 03/01/2018 0514   HDL 55 03/01/2018 0514   CHOLHDL 2.7 03/01/2018 0514  VLDL 14 03/01/2018 0514   LDLCALC 82 03/01/2018 0514   Hepatic Function Panel Recent Labs    03/01/18 0514 03/04/18 0224 03/15/18 1753  PROT 6.3* 6.3* 6.4*  ALBUMIN 3.5 2.8* 3.1*  AST 90* 19 20  ALT 32 18 25  ALKPHOS 34* 33* 97  BILITOT 1.1 0.6 0.6    Imaging: Dg Chest 2 View  Result Date: 03/15/2018 CLINICAL DATA:  Short of breath EXAM: CHEST - 2 VIEW COMPARISON:  03/02/2018, 03/01/2018, 10/11/2017 FINDINGS: Mild cardiomegaly. Small left greater than right pleural effusions. Vascular congestion and mild pulmonary edema. No pneumothorax. IMPRESSION: Cardiomegaly with vascular congestion, mild  pulmonary edema and small left greater than right pleural effusions. Findings are worsened as compared with 03/02/2018. Electronically Signed   By: Donavan Foil M.D.   On: 03/15/2018 18:20    Coronary angiogram 03/20/2018: Left mainmildly calcified but normal, LAD large caliber vessel, mild to moderate diffuse disease followed by occlusion in the mid to distal segment, distally the LAD appears to be diffusely diseased with TIMI I flow. 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 D1 is small, D-2 is moderate to large sized with tandem 80 to 90% stenosis, mild to moderate calcification.  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 RCA small, non-dominant and diffusely diseased without high-grade stenosis. Severe aortic stenosis with peak to peak gradient of 69 and mean gradient of 53 mmHg. Severely calcified mitral annulus and moderately calcified aortic valve.  EKG 03/15/2018: Atrial fibrillation with controlled ventricular response at the rate of 64 bpm, inferior infarct old, anteroseptal infarct old.  Nonspecific T abnormality.  Normal QT interval.  ECHO: 03/02/2018 Left ventricle: The cavity size was mildly reduced. There was moderate concentric hypertrophy. Systolic function was at the lower limits of normal. The estimated ejection fraction was in the range of 45% to 50%. Severe hypokinesis of the apicalanterior, anterolateral, inferoseptal, and apical myocardium. The study is not technically sufficient to allow evaluation of LV diastolic function due to severe mitral apparatus calcification. However suggests grade 2 diastolic dysfunction with elevated LA and LVEDP. - Aortic valve: Mildly calcified mitral valve annulus.  Probably tricuspid aortic valve.  Moderately calcified leaflets.  Severe aortic stenosis.  Mean  gradient 37 mmHg.  Peak gradient 78 mmHg.  Valve area 0.72 cm.   - Mitral valve: Normal-sized, moderately calcified annulus. Moderately calcified leaflets . The findings are consistent with mild stenosis. Mean gradient (D): 8 mm Hg. Valve area by continuity equation (using LVOT flow): 1.32 cm. - Left atrium: The atrium was mildly dilated.  Assessment/Plan 1. Acute on chronic diastolic heart failure 2. Persistent dry cough, may be related to pharyngeal irritation from persistent cough when he was admitted with acute MI and pneumonia, no further fever, nonproductive, improved with codeine syrup. Now off ARB to see if cough improves 3. Severe aortic stenosis, being considered for TAVR.  4. Diabetes mellitus type 2 controlled with hyperglycemia 5.  Hyperlipidemia, mixed 6.  Mild obesity 7.  Obstructive sleep apnea on CPAP and compliant 8.  CAD native vessel. Has occluded mid LAD and residual D1 disease. 9.  Persistent atrial fibrillation presently on aspirin 81 mg and Eliquis 5 mg twice daily CHA2DS2-VASCScore: Risk Score  4,  Yearly risk of stroke  4.   Recommendation: Patient is recently hemodynamically stable.  He is being evaluated by TAVR team.  He probably will need repeat right and left heart catheterization.  I can try to set this up on Monday or Tuesday.  We will continue to hold ARB to see if cough improves. Chronic anemia.  No obvious source of bleeding.  Suspect anemia of chronic disease. Check anemia panel.   Adrian Prows, M.D. 03/17/2018, 9:54 AM [facility] Pager: [phone] Office: [phone] If no answer: [phone]

## 2018-03-17 NOTE — Progress Notes (Signed)
Pt to notify when he is ready for CPAP

## 2018-03-18 ENCOUNTER — Inpatient Hospital Stay (HOSPITAL_COMMUNITY): Payer: Medicare Other

## 2018-03-18 DIAGNOSIS — R0989 Other specified symptoms and signs involving the circulatory and respiratory systems: Secondary | ICD-10-CM

## 2018-03-18 LAB — CBC
HCT: 25.3 % — ABNORMAL LOW (ref 39.0–52.0)
Hemoglobin: 8 g/dL — ABNORMAL LOW (ref 13.0–17.0)
MCH: 27.1 pg (ref 26.0–34.0)
MCHC: 31.6 g/dL (ref 30.0–36.0)
MCV: 85.8 fL (ref 78.0–100.0)
Platelets: 336 10*3/uL (ref 150–400)
RBC: 2.95 MIL/uL — ABNORMAL LOW (ref 4.22–5.81)
RDW: 15.3 % (ref 11.5–15.5)
WBC: 9.3 10*3/uL (ref 4.0–10.5)

## 2018-03-18 LAB — BASIC METABOLIC PANEL
Anion gap: 11 (ref 5–15)
BUN: 27 mg/dL — ABNORMAL HIGH (ref 8–23)
CO2: 22 mmol/L (ref 22–32)
Calcium: 8.2 mg/dL — ABNORMAL LOW (ref 8.9–10.3)
Chloride: 102 mmol/L (ref 98–111)
Creatinine, Ser: 1.19 mg/dL (ref 0.61–1.24)
GFR calc Af Amer: >60 mL/min (ref >60)
GFR calc non Af Amer: 58 mL/min — ABNORMAL LOW (ref >60)
Glucose, Bld: 137 mg/dL — ABNORMAL HIGH (ref 70–99)
Potassium: 3.7 mmol/L (ref 3.5–5.1)
Sodium: 135 mmol/L (ref 135–145)

## 2018-03-18 LAB — GLUCOSE, CAPILLARY
Glucose-Capillary: 123 mg/dL — ABNORMAL HIGH (ref 70–99)
Glucose-Capillary: 150 mg/dL — ABNORMAL HIGH (ref 70–99)
Glucose-Capillary: 142 mg/dL — ABNORMAL HIGH (ref 70–99)

## 2018-03-18 LAB — OCCULT BLOOD X 1 CARD TO LAB, STOOL: Fecal Occult Bld: POSITIVE — AB

## 2018-03-18 MED ORDER — OFF THE BEAT BOOK
Freq: Once | Status: DC
  Filled 2018-03-18: qty 1

## 2018-03-18 MED ORDER — OXYCODONE-ACETAMINOPHEN 5-325 MG PO TABS
1.0000 | ORAL_TABLET | Freq: Four times a day (QID) | ORAL | Status: DC | PRN
  Administered 2018-03-18: 1 via ORAL
  Filled 2018-03-18: qty 1

## 2018-03-18 MED ORDER — METOPROLOL TARTRATE 12.5 MG HALF TABLET
12.5000 mg | ORAL_TABLET | Freq: Two times a day (BID) | ORAL | Status: DC
  Administered 2018-03-18 – 2018-03-20 (×5): 12.5 mg via ORAL
  Filled 2018-03-18 (×5): qty 1

## 2018-03-18 MED ORDER — FUROSEMIDE 20 MG PO TABS: 20.0000 mg | ORAL_TABLET | Freq: Every day | ORAL | Status: DC

## 2018-03-18 NOTE — Progress Notes (Signed)
Per [facility], c/o difficulty breathing, respitory assessment documented--Spoke to RT Shaw and suggested flutter valve /acapella, she will bring to pt.  IS was placed in pt room, explained to wife, wife verbalized understanding/ having had used prior, and I will confirm correct demo with teach back once pt has rested and is more awake.

## 2018-03-18 NOTE — Progress Notes (Signed)
VASCULAR LAB PRELIMINARY  PRELIMINARY  PRELIMINARY  PRELIMINARY  Carotid duplex completed.    Preliminary report:  1-39% ICA stenosis.  Vertebral artery flow is antegrade.  Significant calcific plaque at the left ICA origin.  Wynne Rozak, RVT 03/18/2018, 7:23 PM

## 2018-03-18 NOTE — Progress Notes (Signed)
Subjective:   Cody Phelps  is a 77 y.o. 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  He was also found to have severe aortic stenosis with a mean gradient of 53 mmHg during angiography and 40 mmHg with echocardiogram, controlled diabetes mellitus, obstructive sleep apnea on CPAP, hyperlipidemia, hypertension, who developed paroxysmal atrial fibrillation after his acute myocardial infarction presentation now on amiodarone was discharged on 03/05/2018.    During hospital admission he also had a temperature of 101 F, blood cultures were negative, he was also treated for PNA.  Since discharge she has not had any further episodes of fever or chills, but cough has persisted.  No hemoptysis, no leg edema.  Continues to have marked generalized weakness and fatigue and shortness of breath.  He is now admitted with worsening dyspnea, fatigue and dry cough and pleuritic chest pain. No significant change in cough. Had an episode of dyspnea, pleuritic CP and hypotension yesterday. BP is soft today. Had CTA in preparation for TAVR.  Objective:  Vital Signs in the last 24 hours: Temp:  [98.1 F (36.7 C)-99.1 F (37.3 C)] 98.1 F (36.7 C) (08/18 0756) Pulse Rate:  [69-93] 71 (08/18 0756) Resp:  [18-28] 20 (08/18 0756) BP: (72-126)/(55-82) 107/65 (08/18 0756) SpO2:  [92 %-98 %] 93 % (08/18 0756) Weight:  [95.4 kg] 95.4 kg (08/18 0601)  Intake/Output from previous day: 08/17 0701 - 08/18 0700 In: 723 [P.O.:720; I.V.:3] Out: 1425 [Urine:1425]  Physical Exam: Blood pressure 107/65, pulse 71, temperature 98.1 F (36.7 C), temperature source Oral, resp. rate 20, height 5' 9"  (1.753 m), weight 95.4 kg, SpO2 93 %. Body mass index is 31.07 kg/m.  Physical Exam  Constitutional: He is oriented to person, place, and time.  He is moderately built and  mildly obese in no  acute distress.  HENT:  Head: Atraumatic.  Eyes: Conjunctivae are normal.  Neck: Neck supple. No JVD present. No thyromegaly present.  Cardiovascular:  S1 is normal, S2 is muffled.  Crescendo decrescendo 3/6 midsystolic murmur heard at the apex and right sternal border.  No gallop.  Pulmonary/Chest: Effort normal and breath sounds normal. No stridor.  Abdominal: Soft. Bowel sounds are normal.  Musculoskeletal: Normal range of motion. He exhibits no edema.  Neurological: He is alert and oriented to person, place, and time.  Skin: Skin is warm and dry.  Psychiatric: He has a normal mood and affect.   Lab Results: BMP Recent Labs    03/16/18 0415 03/17/18 0454 03/18/18 0330  NA 138 135 135  K 4.1 3.7 3.7  CL 104 105 102  CO2 22 22 22   GLUCOSE 127* 125* 137*  BUN 20 24* 27*  CREATININE 1.05 1.11 1.19  CALCIUM 8.7* 8.2* 8.2*  GFRNONAA >60 >60 58*  GFRAA >60 >60 >60    CBC Recent Labs  Lab 03/18/18 0330  WBC 9.3  RBC 2.95*  HGB 8.0*  HCT 25.3*  PLT 336  MCV 85.8  MCH 27.1  MCHC 31.6  RDW 15.3    HEMOGLOBIN A1C Lab Results  Component Value Date   HGBA1C 6.4 (H) 03/01/2018   MPG 136.98 03/01/2018    Cardiac Panel (last 3 results) Recent Labs    03/01/18 1244 03/01/18 1936 03/15/18 1753  TROPONINI 26.12* 22.81* 0.66*   Lipid Panel     Component Value Date/Time   CHOL 151 03/01/2018 0514  TRIG 69 03/01/2018 0514   HDL 55 03/01/2018 0514   CHOLHDL 2.7 03/01/2018 0514   VLDL 14 03/01/2018 0514   LDLCALC 82 03/01/2018 0514   Hepatic Function Panel Recent Labs    03/01/18 0514 03/04/18 0224 03/15/18 1753  PROT 6.3* 6.3* 6.4*  ALBUMIN 3.5 2.8* 3.1*  AST 90* 19 20  ALT 32 18 25  ALKPHOS 34* 33* 97  BILITOT 1.1 0.6 0.6    Imaging: Ct Coronary Morph W/cta Cor W/score W/ca W/cm &/or Wo/cm  Result Date: 03/17/2018 CLINICAL DATA:  77 year old male with severe aortic stenosis being evaluated for a TAVR procedure. EXAM: Cardiac TAVR CT TECHNIQUE:  The patient was scanned on a [employer]. A 120 kV retrospective scan was triggered in the descending thoracic aorta at 111 HU's. Gantry rotation speed was 250 msecs and collimation was .6 mm. No beta blockade or nitro were given. The 3D data set was reconstructed in 5% intervals of the R-R cycle. Systolic and diastolic phases were analyzed on a dedicated work station using MPR, MIP and VRT modes. The patient received 80 cc of contrast. FINDINGS: Aortic Valve: Trileaflet aortic valve with severely thickened and calcified leaflets and severe asymmetric calcification extending into the LVOT from under left coronary cusp and continues into mitral annular calcifications. Aorta: Normal size, minimal calcifications and atheroma, no dissection. Sinotubular Junction: 26 x 26 mm Ascending Thoracic Aorta: 30 x 28 mm Aortic Arch: 25 x 22 mm Descending Thoracic Aorta: 23 x 22 mm Sinus of Valsalva Measurements: Non-coronary: 31 mm Right -coronary: 31 mm Left -coronary: 30 mm Coronary Artery Height above Annulus: Left Main: 14 mm Right Coronary: 17 mm Virtual Basal Annulus Measurements: Maximum/Minimum Diameter: 27.3 x 21.5 mm Mean Diameter: 24.2 mm Perimeter: 79.0 mm Area: 460 mm2 Optimum Fluoroscopic Angle for Delivery: LAO 0 CAU 0. IMPRESSION: 1. Trileaflet aortic valve with severely thickened and calcified leaflets and severe asymmetric calcification extending into the LVOT from under left coronary cusp and continues into mitral annular calcifications. Annular measurements suitable for delivery of a 26 mm Edwards-SAPIEN 3 valve. 2. Sufficient coronary to annulus distance. 3. Optimum Fluoroscopic Angle for Delivery:  LAO 0 CAU 0. 4. No thrombus in the left atrial appendage. Electronically Signed   By: Ena Dawley   On: 03/17/2018 15:05    Coronary angiogram 03/20/2018: Left mainmildly calcified but normal, LAD large caliber vessel, mild to moderate diffuse disease followed by occlusion in the mid to distal  segment, distally the LAD appears to be diffusely diseased with TIMI I flow. 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 D1 is small, D-2 is moderate to large sized with tandem 80 to 90% stenosis, mild to moderate calcification.  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 RCA small, non-dominant and diffusely diseased without high-grade stenosis. Severe aortic stenosis with peak to peak gradient of 69 and mean gradient of 53 mmHg. Severely calcified mitral annulus and moderately calcified aortic valve.  EKG 03/15/2018: Atrial fibrillation with controlled ventricular response at the rate of 64 bpm, inferior infarct old, anteroseptal infarct old.  Nonspecific T abnormality.  Normal QT interval.  ECHO: 03/02/2018 Left ventricle: The cavity size was mildly reduced. There was moderate concentric hypertrophy. Systolic function was at the lower limits of normal. The estimated ejection fraction was in the range of 45% to 50%. Severe hypokinesis of  the apicalanterior, anterolateral, inferoseptal, and apical myocardium. The study is not technically sufficient to allow evaluation of LV diastolic function due to severe mitral apparatus calcification. However suggests grade 2 diastolic dysfunction with elevated LA and LVEDP. - Aortic valve: Mildly calcified mitral valve annulus.  Probably tricuspid aortic valve.  Moderately calcified leaflets.  Severe aortic stenosis.  Mean gradient 37 mmHg.  Peak gradient 78 mmHg.  Valve area 0.72 cm.   - Mitral valve: Normal-sized, moderately calcified annulus. Moderately calcified leaflets . The findings are consistent with mild stenosis. Mean gradient (D): 8 mm Hg. Valve area by continuity equation (using LVOT flow): 1.32 cm. - Left atrium: The atrium was mildly  dilated.  Assessment/Plan 1. Acute on chronic diastolic heart failure 2. Persistent dry cough, may be related to pharyngeal irritation from persistent cough when he was admitted with acute MI and pneumonia, no further fever, nonproductive, improved with codeine syrup. Now off ARB to see if cough improves 3. Severe aortic stenosis, being considered for TAVR.  4. Diabetes mellitus type 2 controlled with hyperglycemia 5.  Hyperlipidemia, mixed 6.  Mild obesity 7.  Obstructive sleep apnea on CPAP and compliant 8.  CAD native vessel. Has occluded mid LAD and residual D1 disease. 9.  Persistent atrial fibrillation presently on aspirin 81 mg and Eliquis 5 mg twice daily CHA2DS2-VASCScore: Risk Score  4,  Yearly risk of stroke  4.   Recommendation:He is being evaluated by TAVR team.  He probably will need repeat right and left heart catheterization.  We will continue to hold ARB to see if cough improves and continue los dose aldactone for CHF. Decrease Metoprolol to 12.5 mg BID in view of soft BP. I will repeat echo and will consider VQ scan to exclude PE in view of continued pleuritic CP and dyspnea which cannot be explained by his present condition, will check with radiology if able to comment on PE by studies done this morning.  Chronic anemia.  No obvious source of bleeding.  Suspect anemia of chronic disease. Anemia panel suggests mild decrease in iron. Does not explain profound anemia which is stable. Met his daughter and wife and explained as well. Will have Murriel Hopper see him in am for anemia. No obvious source.  Adrian Prows, M.D. 03/18/2018, 9:04 AM [facility] Pager: [phone] Office: [phone] If no answer: [phone]

## 2018-03-18 NOTE — Plan of Care (Signed)
Exit Care Handouts given to pt and spouse titled, "Angiogram", "Heart Failure', "Heart Failure Action Plan", "Heart Failure Medicines", "Shortness of Breath", "Aortic Valve Stenosis", "Aortic Valve Replacement", "Aortic Value Replacement After Care",  "Cardiac Rehabilitation".

## 2018-03-19 ENCOUNTER — Inpatient Hospital Stay (HOSPITAL_COMMUNITY): Payer: Medicare Other

## 2018-03-19 LAB — CBC WITH DIFFERENTIAL/PLATELET
Abs Immature Granulocytes: 0.1 10*3/uL (ref 0.0–0.1)
Basophils Absolute: 0 10*3/uL (ref 0.0–0.1)
Basophils Relative: 0 %
Eosinophils Absolute: 0 10*3/uL (ref 0.0–0.7)
Eosinophils Relative: 0 %
HCT: 26.7 % — ABNORMAL LOW (ref 39.0–52.0)
Hemoglobin: 8.4 g/dL — ABNORMAL LOW (ref 13.0–17.0)
Immature Granulocytes: 1 %
Lymphocytes Relative: 6 %
Lymphs Abs: 0.5 10*3/uL — ABNORMAL LOW (ref 0.7–4.0)
MCH: 26.8 pg (ref 26.0–34.0)
MCHC: 31.5 g/dL (ref 30.0–36.0)
MCV: 85.3 fL (ref 78.0–100.0)
Monocytes Absolute: 1 10*3/uL (ref 0.1–1.0)
Monocytes Relative: 10 %
Neutro Abs: 7.8 10*3/uL — ABNORMAL HIGH (ref 1.7–7.7)
Neutrophils Relative %: 83 %
Platelets: 348 10*3/uL (ref 150–400)
RBC: 3.13 MIL/uL — ABNORMAL LOW (ref 4.22–5.81)
RDW: 15.3 % (ref 11.5–15.5)
WBC: 9.4 10*3/uL (ref 4.0–10.5)

## 2018-03-19 LAB — URINALYSIS, COMPLETE (UACMP) WITH MICROSCOPIC
Bilirubin Urine: NEGATIVE
Glucose, UA: NEGATIVE mg/dL
Ketones, ur: NEGATIVE mg/dL
Nitrite: NEGATIVE
Protein, ur: NEGATIVE mg/dL
Specific Gravity, Urine: 1.025 (ref 1.005–1.030)
pH: 6 (ref 5.0–8.0)

## 2018-03-19 LAB — GLUCOSE, CAPILLARY
Glucose-Capillary: 117 mg/dL — ABNORMAL HIGH (ref 70–99)
Glucose-Capillary: 155 mg/dL — ABNORMAL HIGH (ref 70–99)
Glucose-Capillary: 109 mg/dL — ABNORMAL HIGH (ref 70–99)
Glucose-Capillary: 130 mg/dL — ABNORMAL HIGH (ref 70–99)

## 2018-03-19 LAB — SAVE SMEAR

## 2018-03-19 LAB — ECHOCARDIOGRAM LIMITED
Height: 69 in
Weight: 3377.6 oz

## 2018-03-19 LAB — PSA: Prostatic Specific Antigen: 0.01 ng/mL (ref 0.00–4.00)

## 2018-03-19 MED ORDER — FERROUS SULFATE 325 (65 FE) MG PO TABS
325.0000 mg | ORAL_TABLET | Freq: Three times a day (TID) | ORAL | Status: DC
  Administered 2018-03-19 – 2018-03-20 (×3): 325 mg via ORAL
  Filled 2018-03-19 (×3): qty 1

## 2018-03-19 NOTE — Progress Notes (Signed)
Occupational Therapy Treatment Patient Details Name: Cody Phelps MRN: [id number] DOB: [birth year] Today's Date: 03/19/2018    History of present illness 77 y.o. male admitted with SOB and generalized weakness. Recently discharged 8/05 after acute MI. Pt has severe aortic stenosis. PMH includes but not limited to: DM2, Prostate CA, PAF, OSA on CPAP, HLD, HTN, L carotid stenosis, R THR 3/19, R ankle ORF, coronary/graft acute MI revascularization 03/01/18.    OT comments  Pt progressing towards established OT goals. Pt performing functional mobility and grooming at sink with Min guard A and RW. Providing education on shower transfer techniques and pt verbalizing the techniques he uses at home. Discussing different compensatory techniques for LB dressing with AE; pt and wife verbalized understanding. Continue to recommend dc home with HHOT and will continue to follow acutely as admitted.    Follow Up Recommendations  Home health OT;Supervision/Assistance - 24 hour    Equipment Recommendations  None recommended by OT    Recommendations for Other Services      Precautions / Restrictions Precautions Precautions: Fall Restrictions Weight Bearing Restrictions: No RUE Weight Bearing: Weight bearing as tolerated       Mobility Bed Mobility               General bed mobility comments: OOB with PT  Transfers Overall transfer level: Needs assistance Equipment used: Rolling walker (2 wheeled) Transfers: Sit to/from Stand Sit to Stand: Min guard         General transfer comment: Guarding assist for safety.     Balance Overall balance assessment: Mild deficits observed, not formally tested                                         ADL either performed or assessed with clinical judgement   ADL Overall ADL's : Needs assistance/impaired     Grooming: Oral care;Standing;Min guard Grooming Details (indicate cue type and reason): Pt performing oral care at sink with  Min Guard A for safety               Lower Body Dressing Details (indicate cue type and reason): Discussed with pt and his wife different compensatory techniques for donning socks, pants, and shoes with AE. Pt reporting he owns a sock aide, reacher, and shoe horn. But wife assists at home saying "I dont have anything better to do" Toilet Transfer: Min Fish farm manager Details (indicate cue type and reason): simulated to Best boy Details (indicate cue type and reason): Educating pt and family on safe shower transfers. Both verbalize understanding Functional mobility during ADLs: Min guard;Rolling walker(limited assessment) General ADL Comments: Pt demonstrating increased activity tolerance      Vision   Vision Assessment?: No apparent visual deficits   Perception     Praxis      Cognition Arousal/Alertness: Awake/alert Behavior During Therapy: Flat affect Overall Cognitive Status: Within Functional Limits for tasks assessed                                          Exercises     Shoulder Instructions       General Comments Family present throughout session. SpO2 in 90s and HR in 80s    Pertinent Vitals/ Pain  Pain Assessment: Faces Faces Pain Scale: Hurts little more Pain Location: chest from coughing Pain Descriptors / Indicators: Discomfort;Sore Pain Intervention(s): Limited activity within patient's tolerance;Monitored during session;Repositioned  Home Living                                          Prior Functioning/Environment              Frequency  Min 2X/week        Progress Toward Goals  OT Goals(current goals can now be found in the care plan section)  Progress towards OT goals: Progressing toward goals  Acute Rehab OT Goals Patient Stated Goal: get stronger OT Goal Formulation: With patient/family Time For Goal Achievement: 03/31/18 Potential to Achieve Goals:  Good ADL Goals Pt Will Perform Grooming: with modified independence;standing Pt Will Perform Lower Body Dressing: with modified independence;sit to/from stand Pt Will Transfer to Toilet: with modified independence;ambulating;regular height toilet Pt Will Perform Toileting - Clothing Manipulation and hygiene: with modified independence;sit to/from stand Pt Will Perform Tub/Shower Transfer: Shower transfer;with modified independence;ambulating;rolling walker  Plan Discharge plan remains appropriate    Co-evaluation                 AM-PAC PT "6 Clicks" Daily Activity     Outcome Measure   Help from another person eating meals?: None Help from another person taking care of personal grooming?: None Help from another person toileting, which includes using toliet, bedpan, or urinal?: A Little Help from another person bathing (including washing, rinsing, drying)?: A Lot Help from another person to put on and taking off regular upper body clothing?: A Little Help from another person to put on and taking off regular lower body clothing?: A Lot 6 Click Score: 18    End of Session Equipment Utilized During Treatment: Rolling walker  OT Visit Diagnosis: Other abnormalities of gait and mobility (R26.89);Muscle weakness (generalized) (M62.81)   Activity Tolerance Patient tolerated treatment well   Patient Left with call bell/phone within reach;with family/visitor present;in chair   Nurse Communication Mobility status        Time: 8315-1761 OT Time Calculation (min): 16 min  Charges: OT General Charges $OT Visit: 1 Visit OT Treatments $Self Care/Home Management : 8-22 mins  Zena, OTR/L Acute Rehab Pager: [phone] Office: [place] 03/19/2018, 5:31 PM

## 2018-03-19 NOTE — Care Management Note (Signed)
Case Management Note  Patient Details  Name: AZAR SOUTH MRN: [id number] Date of Birth: [birth year]  Subjective/Objective: Pt presented for SOB- Acute Combined Systolic and Diastolic Heart Failure.  Plan for TAVR per wife next Tuesday.                   Action/Plan: Pt declines HH Services at this time since TAVR will be scheduled for next week. Family wants HH to be set up post procedure. Family states they have utilized [facility] in the past. No further needs from CM at this time.    Expected Discharge Date:                  Expected Discharge Plan:  Home/Self Care  In-House Referral:  NA  Discharge planning Services  CM Consult  Post Acute Care Choice:  Home Health Choice offered to:  Patient  DME Arranged:  N/A DME Agency:  NA  HH Arranged:  Patient Refused HH(due to TAVR planned for next week, he will get [facility] services post procedure.  ) HH Agency:  NA  Status of Service:  Completed, signed off  If discussed at [place] of Stay Meetings, dates discussed:    Additional Comments:  Bethena Roys, RN 03/19/2018, 4:40 PM

## 2018-03-19 NOTE — Progress Notes (Signed)
RT came to place patient on CPAP. Patient has already place himself on CPAP HS. No assistance is needed.

## 2018-03-19 NOTE — Progress Notes (Signed)
Patient's wife came out of patient's room to get RN. Patient with CP. Patient rated CP 1/10, L sided and stabbing. EKG taken and in chart. Oxygen applied. 1 nitro sublingual given. Patient unsure if any relief after 1 nitro. BP 88/77. Doctor [name] called and made aware of the above events. Percocet to be ordered and given to the patient.

## 2018-03-19 NOTE — Progress Notes (Signed)
Physical Therapy Treatment Patient Details Name: Cody Phelps MRN: [id number] DOB: [birth year] Today's Date: 03/19/2018    History of Present Illness 77 y.o. male admitted with SOB and generalized weakness. Recently discharged 8/05 after acute MI. Pt has severe aortic stenosis. PMH includes but not limited to: DM2, Prostate CA, PAF, OSA on CPAP, HLD, HTN, L carotid stenosis, R THR 3/19, R ankle ORF, coronary/graft acute MI revascularization 03/01/18.     PT Comments    Pt admitted with above diagnosis. Pt currently with functional limitations due to balance and endurance deficits. Pt was able to ambulate with RW up to 610 feet.  Overall, did well.  Awaiting TAVR placement next week.   Pt will benefit from skilled PT to increase their independence and safety with mobility to allow discharge to the venue listed below.    TAVR assessment 6 Minute Walk Test  Distance - 610 ft      Pre    Post  BP     136/63   160/75  HR    81 bpm   95 bpm   SaO2    96%    96%  Modified Borg Dyspnea 3    4  RPE    13    13    5  Meter Walk Test  Trial   1) 12.01sec   2) 11.90 sec  3) 12.16 sec  Average - 12.02 sec = 1.36 ft/sec   Clinical Frailty Scale - 4   Follow Up Recommendations  Home health PT;Supervision for mobility/OOB     Equipment Recommendations  None recommended by PT    Recommendations for Other Services OT consult     Precautions / Restrictions Precautions Precautions: Fall Restrictions Weight Bearing Restrictions: No RUE Weight Bearing: Weight bearing as tolerated    Mobility  Bed Mobility               General bed mobility comments: up in chair on arrival  Transfers Overall transfer level: Needs assistance Equipment used: Rolling walker (2 wheeled) Transfers: Sit to/from Stand Sit to Stand: Min guard         General transfer comment: Guarding assist for safety.   Ambulation/Gait Ambulation/Gait assistance: Min guard Gait Distance (Feet):  610 Feet Assistive device: Rolling walker (2 wheeled) Gait Pattern/deviations: Step-through pattern;Decreased stride length;Trunk flexed Gait velocity: decreased Gait velocity interpretation: 1.31 - 2.62 ft/sec, indicative of limited community ambulator General Gait Details: Pt was able to ambulate with cues to stand tall as he keeps trunk slightly flexed.  Pt performed gait tests for TAVR.    Stairs             Wheelchair Mobility    Modified Rankin (Stroke Patients Only)       Balance Overall balance assessment: Mild deficits observed, not formally tested                                          Cognition Arousal/Alertness: Awake/alert Behavior During Therapy: Flat affect Overall Cognitive Status: Within Functional Limits for tasks assessed                                        Exercises      General Comments General comments (skin integrity, edema, etc.): OT came in toward mid to  end of session and pt was left at sink with OT.         Pertinent Vitals/Pain Pain Assessment: Faces Faces Pain Scale: Hurts little more Pain Location: chest from coughing Pain Descriptors / Indicators: Discomfort;Sore Pain Intervention(s): Limited activity within patient's tolerance;Monitored during session;Repositioned    Home Living                      Prior Function            PT Goals (current goals can now be found in the care plan section) Acute Rehab PT Goals Patient Stated Goal: get stronger Progress towards PT goals: Progressing toward goals    Frequency    Min 3X/week      PT Plan Current plan remains appropriate    Co-evaluation              AM-PAC PT "6 Clicks" Daily Activity  Outcome Measure  Difficulty turning over in bed (including adjusting bedclothes, sheets and blankets)?: None Difficulty moving from lying on back to sitting on the side of the bed? : None Difficulty sitting down on and  standing up from a chair with arms (e.g., wheelchair, bedside commode, etc,.)?: None Help needed moving to and from a bed to chair (including a wheelchair)?: None Help needed walking in hospital room?: A Little Help needed climbing 3-5 steps with a railing? : A Little 6 Click Score: 22    End of Session Equipment Utilized During Treatment: Gait belt Activity Tolerance: Patient tolerated treatment well Patient left: with family/visitor present(at sink with OT) Nurse Communication: Mobility status PT Visit Diagnosis: Unsteadiness on feet (R26.81)     Time: 7829-5621 PT Time Calculation (min) (ACUTE ONLY): 46 min  Charges:  $Gait Training: 23-37 mins $Therapeutic Activity: 8-22 mins                     [facility] [phone]   Denice Paradise 03/19/2018, 4:01 PM

## 2018-03-19 NOTE — Progress Notes (Signed)
Subjective:   Cody Phelps  is a 77 y.o. 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  He was also found to have severe aortic stenosis with a mean gradient of 53 mmHg during angiography and 40 mmHg with echocardiogram, controlled diabetes mellitus, obstructive sleep apnea on CPAP, hyperlipidemia, hypertension, who developed paroxysmal atrial fibrillation after his acute myocardial infarction presentation now on amiodarone was discharged on 03/05/2018.    During hospital admission he also had a temperature of 101 F, blood cultures were negative, he was also treated for PNA.  Since discharge she has not had any further episodes of fever or chills, but cough has persisted.  No hemoptysis, no leg edema.  Continues to have marked generalized weakness and fatigue and shortness of breath.  He is now admitted with worsening dyspnea, fatigue and dry cough and pleuritic chest pain.   His daughter and wife present. Cough slightly better. Continues to have CP with cough. Dyspnea persists. BP has been soft. Had CTA in preparation for TAVR, results reviewed..  Objective:  Vital Signs in the last 24 hours: Temp:  [98 F (36.7 C)-99.6 F (37.6 C)] 99.6 F (37.6 C) (08/19 1146) Pulse Rate:  [64-87] 79 (08/19 1146) Resp:  [15-95] 21 (08/19 1146) BP: (88-140)/(66-77) 131/67 (08/19 1146) SpO2:  [91 %-97 %] 93 % (08/19 1146) Weight:  [95.8 kg] 95.8 kg (08/19 0618)  Intake/Output from previous day: 08/18 0701 - 08/19 0700 In: 3 [I.V.:3] Out: 700 [Urine:700]  Physical Exam: Blood pressure 131/67, pulse 79, temperature 99.6 F (37.6 C), temperature source Oral, resp. rate (!) 21, height _0  (1.753 m), weight 95.8 kg, SpO2 93 %. Body mass index is 31.17 kg/m.  Physical Exam  Constitutional: He is oriented to person, place, and time.  He is moderately built and   mildly obese in no acute distress.  HENT:  Head: Atraumatic.  Eyes: Conjunctivae are normal.  Neck: Neck supple. No JVD present. No thyromegaly present.  Cardiovascular:  S1 is normal, S2 is muffled.  Crescendo decrescendo 3/6 midsystolic murmur heard at the apex and right sternal border.  No gallop.  Pulmonary/Chest: Effort normal. No stridor. He has rales (at bases).  Abdominal: Soft. Bowel sounds are normal.  Musculoskeletal: Normal range of motion. He exhibits no edema.  Neurological: He is alert and oriented to person, place, and time.  Skin: Skin is warm and dry.  Psychiatric: He has a normal mood and affect.   Lab Results: BMP Recent Labs    03/16/18 0415 03/17/18 0454 03/18/18 0330  NA 138 135 135  K 4.1 3.7 3.7  CL 104 105 102  CO2 _1 GLUCOSE 127* 125* 137*  BUN 20 24* 27*  CREATININE 1.05 1.11 1.19  CALCIUM 8.7* 8.2* 8.2*  GFRNONAA >60 >60 58*  GFRAA >60 >60 >60    CBC Recent Labs  Lab 03/19/18 1043  WBC 9.4  RBC 3.13*  HGB 8.4*  HCT 26.7*  PLT 348  MCV 85.3  MCH 26.8  MCHC 31.5  RDW 15.3  LYMPHSABS 0.5*  MONOABS 1.0  EOSABS 0.0  BASOSABS 0.0    HEMOGLOBIN A1C Lab Results  Component Value Date   HGBA1C 6.4 (H) 03/01/2018   MPG 136.98 03/01/2018    Cardiac Panel (last 3 results) Recent Labs    03/01/18 1244 03/01/18 1936 03/15/18 1753  TROPONINI 26.12* 22.81* 0.66*  Lipid Panel     Component Value Date/Time   CHOL 151 03/01/2018 0514   TRIG 69 03/01/2018 0514   HDL 55 03/01/2018 0514   CHOLHDL 2.7 03/01/2018 0514   VLDL 14 03/01/2018 0514   LDLCALC 82 03/01/2018 0514   Hepatic Function Panel Recent Labs    03/01/18 0514 03/04/18 0224 03/15/18 1753  PROT 6.3* 6.3* 6.4*  ALBUMIN 3.5 2.8* 3.1*  AST 90* 19 20  ALT 32 18 25  ALKPHOS 34* 33* 97  BILITOT 1.1 0.6 0.6   CT ANGIO CHEST AORTA W/CM &/OR WO/CM 03/17/2018:  1. Vascular findings and measurements pertinent to potential TAVR procedure, as detailed above. 2.  Severe thickening calcifications of the aortic valve, compatible with the reported clinical history of severe aortic stenosis. 3. Aortic atherosclerosis, in addition to left main and 3 vessel coronary artery disease. 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 Importantly, there is also a moderate to large volume of pericardial fluid and pericardial enhancement, which could indicate post infarct pericarditis. 4. Moderate bilateral pleural effusions lying dependently with extensive passive atelectasis in the dependent portions of the lower lobes of the lungs bilaterally. 5. Severe calcifications of the mitral annulus also noted. 6. Mild colonic diverticulosis without evidence to suggest an acute diverticulitis at this time.  Coronary angiogram 03/20/2018: Left mainmildly calcified but normal, LAD large caliber vessel, mild to moderate diffuse disease followed by occlusion in the mid to distal segment, distally the LAD appears to be diffusely diseased with TIMI I flow. 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 D1 is small, D-2 is moderate to large sized with tandem 80 to 90% stenosis, mild to moderate calcification.  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 RCA small, non-dominant and diffusely diseased without high-grade stenosis. Severe aortic stenosis with peak to peak gradient of 69 and mean gradient of 53 mmHg. Severely calcified mitral annulus and moderately calcified aortic valve.  EKG 03/15/2018: Atrial fibrillation with controlled ventricular response at the rate of 64 bpm, inferior infarct old, anteroseptal infarct old.  Nonspecific T abnormality.  Normal QT interval.  ECHO: 03/02/2018 Left ventricle: The  cavity size was mildly reduced. There was moderate concentric hypertrophy. Systolic function was at the lower limits of normal. The estimated ejection fraction was in the range of 45% to 50%. Severe hypokinesis of the apicalanterior, anterolateral, inferoseptal, and apical myocardium. The study is not technically sufficient to allow evaluation of LV diastolic function due to severe mitral apparatus calcification. However suggests grade 2 diastolic dysfunction with elevated LA and LVEDP. - Aortic valve: Mildly calcified mitral valve annulus.  Probably tricuspid aortic valve.  Moderately calcified leaflets.  Severe aortic stenosis.  Mean gradient 37 mmHg.  Peak gradient 78 mmHg.  Valve area 0.72 cm.   - Mitral valve: Normal-sized, moderately calcified annulus. Moderately calcified leaflets . The findings are consistent with mild stenosis. Mean gradient (D): 8 mm Hg. Valve area by continuity equation (using LVOT flow): 1.32 cm. - Left atrium: The atrium was mildly dilated.  Scheduled Meds: . amiodarone  200 mg Oral Daily  . apixaban  5 mg Oral BID  . aspirin EC  81 mg Oral Daily  . atorvastatin  80 mg Oral QPM  . colesevelam  1,875 mg Oral Daily  .  donepezil  10 mg Oral q morning - 10a  . ferrous sulfate  325 mg Oral TID WC  . memantine  5 mg Oral BID  . metoprolol tartrate  12.5 mg Oral BID  . mirabegron ER  25 mg Oral QHS  . off the beat book   Does not apply Once  . omega-3 acid ethyl esters  2 g Oral Daily  . pantoprazole  40 mg Oral Daily  . rOPINIRole  2 mg Oral QHS  . sodium chloride flush  3 mL Intravenous Q12H  . spironolactone  12.5 mg Oral Daily  . tamsulosin  0.4 mg Oral QPC supper   Continuous Infusions: . sodium chloride     PRN Meds:.sodium chloride, acetaminophen, fluticasone, guaiFENesin-codeine, insulin glargine, nabumetone, nitroGLYCERIN, ondansetron (ZOFRAN) IV, oxyCODONE-acetaminophen, rOPINIRole, sodium chloride flush,  zolpidem   Assessment/Plan 1. Acute on chronic diastolic heart failure 2. Persistent dry cough, may be related to pharyngeal irritation from persistent cough when he was admitted with acute MI and pneumonia, post MI pericarditis and pericardial effusion. No further fever, nonproductive, improved with codeine syrup. Now off ARB to see if cough improves 3. Severe aortic stenosis, being considered for TAVR.  4. 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 5.  Hyperlipidemia, mixed 6.  Mild obesity 7.  Obstructive sleep apnea on CPAP and compliant 8.  CAD native vessel. Has occluded mid LAD and residual D1 disease. 9.  Persistent atrial fibrillation presently on aspirin 81 mg and Eliquis 5 mg twice daily CHA2DS2-VASCScore: Risk Score  4,  Yearly risk of stroke  4.  10. Anemia from blood loss during hip surgery. Appreciate hematology consult. Plan iron infusion. 11.  Diabetes mellitus type 2 controlled without hyperglycemia.  Recommendation: I discussed extensively with the patient and wife.  The pericardial effusion by echocardiogram is not very large and it is secondary to Dressler syndrome.  In view of moderate-sized MI, do not want to use any steroids.  Not sure about the data with colchicine.  There is no clinical or echo evidence of tamponade.  Also in view of his obesity, mostly truncal obesity, he would be extremely high risk for attempted pericardiocentesis that is posteriorly located.  At this point elect to TAVR next Tuesday would be most appropriate and hopefully his cough and dyspnea will also be stable.  Continue low-dose beta-blocker along with spironolactone.  ARB still on hold due to possibility of having caused cough.  For now continue conservative management, I plan on discharging home tomorrow morning and he is advised to take it complete bedrest and await planned TAVR the following Tuesday.  Appreciate hematology consultation, he will receive intravenous iron today or  tomorrow prior to discharge.  This should also help in recovery of his anemia.  Continue statins, Eliquis for now along with amiodarone for atrial fibrillation.  Adrian Prows, M.D. 03/19/2018, 3:05 PM [facility] Pager: [phone] Office: [phone] If no answer: [phone]  Lab Results: BMP Recent Labs    03/16/18 0415 03/17/18 0454 03/18/18 0330  NA 138 135 135  K 4.1 3.7 3.7  CL 104 105 102  CO2 _0 GLUCOSE 127* 125* 137*  BUN 20 24* 27*  CREATININE 1.05 1.11 1.19  CALCIUM 8.7* 8.2* 8.2*  GFRNONAA >60 >60 58*  GFRAA >60 >60 >60    CBC Recent Labs  Lab 03/19/18 1043  WBC 9.4  RBC 3.13*  HGB 8.4*  HCT 26.7*  PLT 348  MCV 85.3  MCH 26.8  MCHC  31.5  RDW 15.3  LYMPHSABS 0.5*  MONOABS 1.0  EOSABS 0.0  BASOSABS 0.0    HEMOGLOBIN A1C Lab Results  Component Value Date   HGBA1C 6.4 (H) 03/01/2018   MPG 136.98 03/01/2018    Cardiac Panel (last 3 results) Recent Labs    03/01/18 1244 03/01/18 1936 03/15/18 1753  TROPONINI 26.12* 22.81* 0.66*   Lipid Panel     Component Value Date/Time   CHOL 151 03/01/2018 0514   TRIG 69 03/01/2018 0514   HDL 55 03/01/2018 0514   CHOLHDL 2.7 03/01/2018 0514   VLDL 14 03/01/2018 0514   LDLCALC 82 03/01/2018 0514   Hepatic Function Panel Recent Labs    03/01/18 0514 03/04/18 0224 03/15/18 1753  PROT 6.3* 6.3* 6.4*  ALBUMIN 3.5 2.8* 3.1*  AST 90* 19 20  ALT 32 18 25  ALKPHOS 34* 33* 97  BILITOT 1.1 0.6 0.6    Imaging: No results found.  Coronary angiogram 03/20/2018: Left mainmildly calcified but normal, LAD large caliber vessel, mild to moderate diffuse disease followed by occlusion in the mid to distal segment, distally the LAD appears to be diffusely diseased with TIMI I flow. 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 D1 is small, D-2 is moderate to large sized with  tandem 80 to 90% stenosis, mild to moderate calcification.  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 RCA small, non-dominant and diffusely diseased without high-grade stenosis. Severe aortic stenosis with peak to peak gradient of 69 and mean gradient of 53 mmHg. Severely calcified mitral annulus and moderately calcified aortic valve.  EKG 03/15/2018: Atrial fibrillation with controlled ventricular response at the rate of 64 bpm, inferior infarct old, anteroseptal infarct old.  Nonspecific T abnormality.  Normal QT interval.  ECHO: 03/02/2018 Left ventricle: The cavity size was mildly reduced. There was moderate concentric hypertrophy. Systolic function was at the lower limits of normal. The estimated ejection fraction was in the range of 45% to 50%. Severe hypokinesis of the apicalanterior, anterolateral, inferoseptal, and apical myocardium. The study is not technically sufficient to allow evaluation of LV diastolic function due to severe mitral apparatus calcification. However suggests grade 2 diastolic dysfunction with elevated LA and LVEDP. - Aortic valve: Mildly calcified mitral valve annulus.  Probably tricuspid aortic valve.  Moderately calcified leaflets.  Severe aortic stenosis.  Mean gradient 37 mmHg.  Peak gradient 78 mmHg.  Valve area 0.72 cm.   - Mitral valve: Normal-sized, moderately calcified annulus. Moderately calcified leaflets . The findings are consistent with mild stenosis. Mean gradient (D): 8 mm Hg. Valve area by continuity equation (using LVOT flow): 1.32 cm. - Left atrium: The atrium was mildly dilated.  Assessment/Plan 1. Acute on chronic diastolic heart failure 2. Persistent dry cough, may be related to pharyngeal irritation from persistent cough when he was admitted with acute MI and pneumonia, no further fever, nonproductive, improved with codeine syrup. Now off ARB to see if cough  improves 3. Severe aortic stenosis, being considered for TAVR.  4. Diabetes mellitus type 2 controlled with hyperglycemia 5.  Hyperlipidemia, mixed 6.  Mild obesity 7.  Obstructive sleep apnea on CPAP and compliant 8.  CAD native vessel. Has occluded mid LAD and residual D1 disease. 9.  Persistent atrial fibrillation presently on aspirin 81 mg and Eliquis 5 mg twice daily CHA2DS2-VASCScore: Risk Score  4,  Yearly  risk of stroke  4.   Recommendation:He is being evaluated by TAVR team.  He probably will need repeat right and left heart catheterization.  We will continue to hold ARB to see if cough improves and continue los dose aldactone for CHF. Decrease Metoprolol to 12.5 mg BID in view of soft BP. I will repeat echo and will consider VQ scan to exclude PE in view of continued pleuritic CP and dyspnea which cannot be explained by his present condition, will check with radiology if able to comment on PE by studies done this morning.  Chronic anemia.  No obvious source of bleeding.  Suspect anemia of chronic disease. Anemia panel suggests mild decrease in iron. Does not explain profound anemia which is stable. Met his daughter and wife and explained as well. Will have Murriel Hopper see him in am for anemia. No obvious source.  Adrian Prows, M.D. 03/19/2018, 3:04 PM [facility], [place] Pager: [phone] Office: [phone] If no answer: [phone]

## 2018-03-19 NOTE — Consult Note (Signed)
Date: 03/19/2018               Patient Name:  Cody Phelps MRN: [id number]  DOB: 05-15-1941 Age / Sex: 77 y.o., male   PCP: [name], MD         Requesting Physician: Dr. Adrian Prows, MD    Consulting Reason:  Anemia     Chief Complaint: Cough and shortness of breath  History of Present Illness: 77 year old male with past medical history of DM, HTN, HLD, prostate cancer , CAD with recent myocardial infarction (s/p unsuccessful revascularization attempt 2 weeks ago), and recently diagnosed paroxysmal A. Fib and sever Aortic stenosis, presented with worsening shortness of breath and cough. He sent to [facility] from his PCP office by EMS due to fatigue and dyspnea as well as persistent dry cough on. (During previous admission on 08/01-08/05, he was treated for pneumonia, having fever and cough). He has also had mild lower extremity edema and orthopnea. He reports some chills and abdominal pain when coughing.  At this admission, patient has been treated with gentle diuresis. He found to be anemic during the admission.  Patient denies any hematuria, rectal bleeding, melena, major ecchymosis or bleeding after heart catheterization on his arm. Reports some dizziness that worsened since recent admission for heart attack but denies any syncope.  Meds: Current Facility-Administered Medications  Medication Dose Route Frequency Provider Last Rate Last Dose  . 0.9 %  sodium chloride infusion  250 mL Intravenous PRN Adrian Prows, MD      . acetaminophen (TYLENOL) tablet 325 mg  325 mg Oral Q4H PRN Adrian Prows, MD      . amiodarone (PACERONE) tablet 200 mg  200 mg Oral Daily Adrian Prows, MD   200 mg at 03/19/18 0902  . apixaban (ELIQUIS) tablet 5 mg  5 mg Oral BID Adrian Prows, MD   5 mg at 03/19/18 0902  . aspirin EC tablet 81 mg  81 mg Oral Daily Adrian Prows, MD   81 mg at 03/19/18 0902  . atorvastatin (LIPITOR) tablet 80 mg  80 mg Oral QPM Adrian Prows, MD   80 mg at 03/18/18 1654  . colesevelam  [facility]) tablet 1,875 mg  1,875 mg Oral Daily Adrian Prows, MD   1,875 mg at 03/19/18 0901  . donepezil (ARICEPT) tablet 10 mg  10 mg Oral q morning - 10a Adrian Prows, MD   10 mg at 03/19/18 0902  . ferrous sulfate tablet 325 mg  325 mg Oral TID WC Patwardhan, Manish J, MD   325 mg at 03/19/18 1337  . fluticasone (FLONASE) 50 MCG/ACT nasal spray 1 spray  1 spray Each Nare Daily PRN Adrian Prows, MD      . guaiFENesin-codeine 100-10 MG/5ML solution 10 mL  10 mL Oral Q4H PRN Adrian Prows, MD   10 mL at 03/19/18 0902  . insulin glargine (LANTUS) injection 20 Units  20 Units Subcutaneous QHS PRN Adrian Prows, MD      . memantine [facility]) tablet 5 mg  5 mg Oral BID Adrian Prows, MD   5 mg at 03/19/18 0901  . metoprolol tartrate (LOPRESSOR) tablet 12.5 mg  12.5 mg Oral BID Adrian Prows, MD   12.5 mg at 03/19/18 0902  . mirabegron ER (MYRBETRIQ) tablet 25 mg  25 mg Oral QHS Chakravartti, Jaidip, MD   25 mg at 03/18/18 2207  . nabumetone (RELAFEN) tablet 750 mg  750 mg Oral BID PRN Adrian Prows, MD      .  nitroGLYCERIN (NITROSTAT) SL tablet 0.4 mg  0.4 mg Sublingual Q5 min PRN Adrian Prows, MD   0.4 mg at 03/18/18 2141  . off the beat book   Does not apply Once Adrian Prows, MD      . omega-3 acid ethyl esters (LOVAZA) capsule 2 g  2 g Oral Daily Adrian Prows, MD   2 g at 03/19/18 0902  . ondansetron (ZOFRAN) injection 4 mg  4 mg Intravenous Q6H PRN Adrian Prows, MD      . oxyCODONE-acetaminophen (PERCOCET/ROXICET) 5-325 MG per tablet 1 tablet  1 tablet Oral Q6H PRN Adrian Prows, MD   1 tablet at 03/18/18 2306  . pantoprazole (PROTONIX) EC tablet 40 mg  40 mg Oral Daily Adrian Prows, MD   40 mg at 03/19/18 0902  . rOPINIRole (REQUIP) tablet 1-2 mg  1-2 mg Oral Daily PRN Adrian Prows, MD   1 mg at 03/18/18 1654  . rOPINIRole (REQUIP) tablet 2 mg  2 mg Oral QHS Adrian Prows, MD   2 mg at 03/18/18 2207  . sodium chloride flush (NS) 0.9 % injection 3 mL  3 mL Intravenous Q12H Adrian Prows, MD   3 mL at 03/19/18 0903  . sodium chloride  flush (NS) 0.9 % injection 3 mL  3 mL Intravenous PRN Adrian Prows, MD      . spironolactone (ALDACTONE) tablet 12.5 mg  12.5 mg Oral Daily Adrian Prows, MD   12.5 mg at 03/19/18 0902  . tamsulosin (FLOMAX) capsule 0.4 mg  0.4 mg Oral QPC supper Adrian Prows, MD   0.4 mg at 03/18/18 1654  . zolpidem (AMBIEN) tablet 5 mg  5 mg Oral QHS PRN Adrian Prows, MD        Allergies: Allergies as of 03/15/2018  . (No Known Allergies)   Past Medical History:  Diagnosis Date  . Carotid stenosis, left followed by dr [name], cardiologist--- bilateral bruit per note   per dr [name] note 75/6433  LICA 29-51% stenosis per duplex 05-14-2014  . Diverticulosis   . ED (erectile dysfunction)   . First degree heart block   . GERD (gastroesophageal reflux disease)   . Hypertension    cardiologsit-  dr [name]  . Iron deficiency anemia   . Mixed hyperlipidemia   . OA (osteoarthritis)    right hip  . OSA on CPAP    followed by dr dohmeier, uses CPAP  . PAF (paroxysmal atrial fibrillation) ([name])    a. on Eliquis  . Prostate cancer ([place])    dx 03-17-2017 (bx)  Stage T2a, Gleason 4+4, PSA  4.43, vol 32.32--  s/p radiatctive prostate seed implants 07-10-2017 then IMRT and ADT  . RLS (restless legs syndrome)   . Scoliosis   . Severe aortic stenosis   . Trigger finger   . Type 2 diabetes mellitus ([place])   . Wears partial dentures    upper and lower   Past Surgical History:  Procedure Laterality Date  . APPENDECTOMY  1988  . BILATERAL TOTAL ETHMOIDECTOMY AND SPHENOIDECTOMY  01-14-2009    dr bates   [facility]  . CARPAL TUNNEL RELEASE Bilateral 2013  . CATARACT EXTRACTION W/ INTRAOCULAR LENS  IMPLANT, BILATERAL  date?  . CORONARY BALLOON ANGIOPLASTY N/A 03/01/2018   Procedure: CORONARY BALLOON ANGIOPLASTY;  Surgeon: Adrian Prows, MD;  Location: [place] [facility];  Service: Cardiovascular;  Laterality: N/A;  . CORONARY/GRAFT ACUTE MI REVASCULARIZATION N/A 03/01/2018   Procedure: Coronary/Graft Acute MI Revascularization;   Surgeon: Adrian Prows, MD;  Location: [place] [facility];  Service: Cardiovascular;  Laterality: N/A;  . CYSTOSCOPY  07/19/2017   Procedure: CYSTOSCOPY;  Surgeon: Nickie Retort, MD;  Location: [facility];  Service: Urology;;  No seeds found in [place]  . LAMINECTOMY WITH POSTERIOR LATERAL ARTHRODESIS LEVEL 2 N/A 02/12/2014   Procedure: LUMBAR TWO-THREE,LUIMBAR THREE-FOUR LAMINECTOMY/FORAMINOTOMY;POSSIBLE POSTEROLATERAL ARTHRODESIS WITH AUTOGRAFT;  Surgeon: [name], MD;  Location: [facility];  Service: Neurosurgery;  Laterality: N/A;  . LEFT HEART CATH AND CORONARY ANGIOGRAPHY N/A 03/01/2018   Procedure: LEFT HEART CATH AND CORONARY ANGIOGRAPHY;  Surgeon: Adrian Prows, MD;  Location: [place] [facility];  Service: Cardiovascular;  Laterality: N/A;  . ORIF RIGHT ANKLE FX'S  05/05/2001   retained hardware  . PROSTATE BIOPSY  03-17-2017   dr Pilar Jarvis office  . RADIOACTIVE SEED IMPLANT N/A 07/19/2017   Procedure: RADIOACTIVE SEED IMPLANT/BRACHYTHERAPY IMPLANT;  Surgeon: Nickie Retort, MD;  [facility];  Service: Urology;  Laterality: N/A;  69 seeds implanted  . SPACE OAR INSTILLATION N/A 07/19/2017   Procedure: SPACE OAR INSTILLATION;  Surgeon: Nickie Retort, MD;  Location[facility];  Service: Urology;  Laterality: N/A;  . TEE WITHOUT CARDIOVERSION  03/20/2012   Procedure: TRANSESOPHAGEAL ECHOCARDIOGRAM (TEE);  Surgeon: Laverda Page, MD;  Location: [facility];  Service: Cardiovascular;  Laterality: N/A;  normal LV; normal EF; normal RV; normal LA w/ left atrial appendage very small, normal function, interatrial septum intact without defect; normal RA; trace MR,TR, & PI; mild AV calcification and senile degeneration w/ mild stenosis, AVA 1.7cm^2;;   . TOTAL HIP ARTHROPLASTY Right 10/23/2017   Procedure: TOTAL HIP ARTHROPLASTY ANTERIOR APPROACH;  Surgeon: Frederik Pear, MD;  Location: [place];  Service: Orthopedics;  Laterality:  Right;  . TRANSTHORACIC ECHOCARDIOGRAM  01-11-2017   dr [name]  (per echo note, no significant change in seveity of AS, no other diagnostic change)   moderate concentric LVH, ef 16%, grade 1 diastolic dysfunction/  moderate LAE/  mild , grade 1 AR w/ moderate AV calcification, mild to moderate restricted AV leaflets w/ moderate AS, AVA 1.16cm^2, peak grandient 49mmHg, mean grandient 10mmHg/  trace MR, mild calcification MV annulus , mild MV leaflet calcification, mild MVS, peak grandient 4.96mmHg, mean grandient 2.11mmHg/ trace TR   Family History  Problem Relation Age of Onset  . Stroke Mother   . Hypertension Mother   . Heart attack Father   . Heart murmur Father   . Hypertension Sister    Social History   Socioeconomic History  . Marital status: Married    Spouse name: Butch Penny  . Number of children: 2  . Years of education: 61  . Highest education level: Not on file  Occupational History  . Not on file  Social Needs  . Financial resource strain: Not on file  . Food insecurity:    Worry: Not on file    Inability: Not on file  . Transportation needs:    Medical: Not on file    Non-medical: Not on file  Tobacco Use  . Smoking status: Former Smoker    Years: 1.00    Types: Cigarettes    Last attempt to quit: 02/04/1967    Years since quitting: 51.1  . Smokeless tobacco: Never Used  . Tobacco comment: smoked 1 q2-3 days  Substance and Sexual Activity  . Alcohol use: Yes    Comment: occas.  . Drug use: No  . Sexual activity: Yes  Lifestyle  .  Physical activity:    Days per week: Not on file    Minutes per session: Not on file  . Stress: Not on file  Relationships  . Social connections:    Talks on phone: Not on file    Gets together: Not on file    Attends religious service: Not on file    Active member of club or organization: Not on file    Attends meetings of clubs or organizations: Not on file    Relationship status: Not on file  . Intimate partner violence:    Fear  of current or ex partner: Not on file    Emotionally abused: Not on file    Physically abused: Not on file    Forced sexual activity: Not on file  Other Topics Concern  . Not on file  Social History Narrative   Patient is married Butch Penny).   Patient has two children.   Patient is retired.   Patient has a college education.   Patient is right-handed.   Patient drinks two cups of coffee per day and 2-3 cups of Diet soda daily and limited tea.          Review of Systems: Negative except as mentioned in HPI  Physical Exam: Blood pressure 131/67, pulse 79, temperature 99.6 F (37.6 C), temperature source Oral, resp. rate (!) 21, height 5\' 9"  (1.753 m), weight 95.8 kg, SpO2 93 %.  Physical Exam  Constitutional: He is oriented to person, place, and time. He appears well-developed and well-nourished.  HENT:  Head: Normocephalic and atraumatic. No pallor  Neck: No JVD present. No LAP Cardiovascular: Normal rate. Irregular rhythm  Murmur heard. Pulmonary/Chest: No respiratory distress. He has no decreased breath sounds. He has no wheezes. He has rales in the right lower field and the left lower field.  Abdominal: Soft. Bowel sounds are normal. There is no tenderness.  Musculoskeletal:       Right lower leg: Normal. He exhibits no edema.       Left lower leg: Normal. He exhibits no edema.  Neurological: He is alert and oriented to person, place, and time.  Skin: Skin is warm and dry. No ecchymosis noted. No cyanosis.   Lab results: CBC Latest Ref Rng & Units 03/19/2018 03/18/2018 03/15/2018  WBC 4.0 - 10.5 K/uL 9.4 9.3 11.4(H)  Hemoglobin 13.0 - 17.0 g/dL 8.4(L) 8.0(L) 8.2(L)  Hematocrit 39.0 - 52.0 % 26.7(L) 25.3(L) 26.7(L)  Platelets 150 - 400 K/uL 348 336 373   Iron/TIBC/Ferritin/ %Sat    Component Value Date/Time   IRON 28 (L) 03/17/2018 1013   IRON 143 01/12/2015 1657   TIBC 297 03/17/2018 1013   TIBC 407 01/12/2015 1657   FERRITIN 408 (H) 03/17/2018 1013   FERRITIN 53  01/12/2015 1657   IRONPCTSAT 9 (L) 03/17/2018 1013   IRONPCTSAT 35 01/12/2015 1657    Ref Range & Units 2d ago  Folate >5.9 ng/mL 16.0     Ref Range & Units 2d ago  Vitamin B-12 180 - 914 pg/mL 285     Assessment, Plan, & Recommendations by Problem: Active Problems:   Acute combined systolic and diastolic heart failure ([place])  77 year old male with past medical history of DM, HTN, HLD, and recent myocardial infarction s/p unsuccessful revascularization attempt 2 weeks ago and recently diagnosed paroxysmal A. Fib and sever Aortic stenosis presented with worsening shortness of breath and cough. Consulted today for furthur work up of anemia.  Patint has normocytic anemia, with normal absolute retic  count, slightly decreased calculated retic index. Iron study is normal with Ferritin of 408. Renal and liver function test are within normal limit.  Patient has sever aortic stenosis, how ever there is no evidence of hemolysis in his lab. Patient has had anemia with Hb of 8.6 on March 2019 following an Anterior R total hip arthroplasty on 09/2017, with baseline Hb around 9-10 prior to last admission early August due to MI.  Prior to recent cardiac catheterization on 03/01/18, Hb was 10. No Hx of bleeding, ecchymosis, post procedure bleeding. + for dizziness. Exam is unremarkable and patient has been stable. Current enormocytic anemia is likely 2/2 acute illness and post heart cath.  Retic index calculated low, can be bone marrow suppression s/s acute illness. Has + FOBT, with Hx of hemorrhoid. Iron deficiency anemia is still a possibility despite high Ferritin.   -CBC diff -Kappa/Lambda -MMA -IV Ferroheme  -PSA  -Blood smear--> Revealed normochromic normocytic RBC, No schistocytes or spherocytes. Slight elevation in PLTs. slight increased in neutrophils count, how ever with normal lobulation and granulation.    Impression: Non significant peripheral blood smear -Follow up with GI  outpatient  Signed: Dewayne Hatch, MD 03/19/2018, 4:09 PM

## 2018-03-20 ENCOUNTER — Inpatient Hospital Stay (HOSPITAL_COMMUNITY): Payer: Medicare Other

## 2018-03-20 DIAGNOSIS — I241 Dressler's syndrome: Secondary | ICD-10-CM

## 2018-03-20 DIAGNOSIS — I3139 Other pericardial effusion (noninflammatory): Secondary | ICD-10-CM

## 2018-03-20 DIAGNOSIS — I313 Pericardial effusion (noninflammatory): Secondary | ICD-10-CM

## 2018-03-20 LAB — PULMONARY FUNCTION TEST
DL/VA % pred: 102 %
DL/VA: 4.65 ml/min/mmHg/L
DLCO cor % pred: 51 %
DLCO cor: 15.93 ml/min/mmHg
DLCO unc % pred: 39 %
DLCO unc: 12.22 ml/min/mmHg
FEF 25-75 Post: 1.76 L/sec
FEF 25-75 Pre: 1.4 L/sec
FEF2575-%Change-Post: 25 %
FEF2575-%Pred-Post: 82 %
FEF2575-%Pred-Pre: 66 %
FEV1-%Change-Post: 3 %
FEV1-%Pred-Post: 48 %
FEV1-%Pred-Pre: 47 %
FEV1-Post: 1.43 L
FEV1-Pre: 1.38 L
FEV1FVC-%Change-Post: 8 %
FEV1FVC-%Pred-Pre: 109 %
FEV6-%Change-Post: -4 %
FEV6-%Pred-Post: 43 %
FEV6-%Pred-Pre: 45 %
FEV6-Post: 1.65 L
FEV6-Pre: 1.73 L
FEV6FVC-%Pred-Post: 106 %
FEV6FVC-%Pred-Pre: 106 %
FVC-%Change-Post: -4 %
FVC-%Pred-Post: 40 %
FVC-%Pred-Pre: 42 %
FVC-Post: 1.65 L
FVC-Pre: 1.73 L
Post FEV1/FVC ratio: 87 %
Post FEV6/FVC ratio: 100 %
Pre FEV1/FVC ratio: 80 %
Pre FEV6/FVC Ratio: 100 %
RV % pred: 90 %
RV: 2.26 L
TLC % pred: 59 %
TLC: 4.09 L

## 2018-03-20 LAB — CULTURE, BLOOD (ROUTINE X 2)
Culture: NO GROWTH
Culture: NO GROWTH
Special Requests: ADEQUATE

## 2018-03-20 LAB — GLUCOSE, CAPILLARY: Glucose-Capillary: 118 mg/dL — ABNORMAL HIGH (ref 70–99)

## 2018-03-20 LAB — KAPPA/LAMBDA LIGHT CHAINS
Kappa free light chain: 21 mg/L — ABNORMAL HIGH (ref 3.3–19.4)
Kappa, lambda light chain ratio: 1.04 (ref 0.26–1.65)
Lambda free light chains: 20.1 mg/L (ref 5.7–26.3)

## 2018-03-20 MED ORDER — METOPROLOL TARTRATE 25 MG PO TABS: 12.5000 mg | ORAL_TABLET | Freq: Two times a day (BID) | ORAL | 3 refills | Status: DC

## 2018-03-20 MED ORDER — FUROSEMIDE 10 MG/ML IJ SOLN
40.0000 mg | Freq: Once | INTRAMUSCULAR | Status: AC
  Administered 2018-03-20: 40 mg via INTRAVENOUS
  Filled 2018-03-20: qty 4

## 2018-03-20 MED ORDER — SODIUM CHLORIDE 0.9 % IV SOLN
510.0000 mg | Freq: Once | INTRAVENOUS | Status: AC
  Administered 2018-03-20: 510 mg via INTRAVENOUS
  Filled 2018-03-20: qty 17

## 2018-03-20 MED ORDER — SODIUM CHLORIDE 0.9 % IV SOLN: 510.0000 mg | Freq: Once | INTRAVENOUS | Status: DC

## 2018-03-20 MED ORDER — SPIRONOLACTONE 25 MG PO TABS: 12.5000 mg | ORAL_TABLET | Freq: Every day | ORAL | 3 refills | Status: DC

## 2018-03-20 MED ORDER — ALBUTEROL SULFATE (2.5 MG/3ML) 0.083% IN NEBU
2.5000 mg | INHALATION_SOLUTION | Freq: Once | RESPIRATORY_TRACT | Status: AC
  Administered 2018-03-20: 2.5 mg via RESPIRATORY_TRACT

## 2018-03-20 MED ORDER — GUAIFENESIN-CODEINE 100-10 MG/5ML PO SOLN: 10.0000 mL | Freq: Three times a day (TID) | ORAL | 0 refills | Status: DC | PRN

## 2018-03-20 MED ORDER — TORSEMIDE 20 MG PO TABS: 20.0000 mg | ORAL_TABLET | Freq: Two times a day (BID) | ORAL | 3 refills | Status: DC

## 2018-03-20 MED ORDER — AMIODARONE HCL 200 MG PO TABS: 200.0000 mg | ORAL_TABLET | Freq: Two times a day (BID) | ORAL | 0 refills | Status: DC

## 2018-03-20 MED ORDER — FERROUS SULFATE 325 (65 FE) MG PO TABS: 325.0000 mg | ORAL_TABLET | Freq: Three times a day (TID) | ORAL | 3 refills | Status: DC

## 2018-03-20 MED ORDER — GUAIFENESIN 100 MG/5ML PO SOLN: 5.0000 mL | Freq: Four times a day (QID) | ORAL | 2 refills | Status: DC | PRN

## 2018-03-20 NOTE — Progress Notes (Signed)
Hb stable 8/19. Discussed IV iron w pt/wife including potential for rare infusion reaction & he agrees to proceed. Will give ferraheme 510 mg IV X 1.

## 2018-03-20 NOTE — Plan of Care (Signed)
  Problem: Clinical Measurements: Goal: Will remain free from infection Outcome: Progressing Note:  No s/s of infection noted.   Problem: Activity: Goal: Risk for activity intolerance will decrease Outcome: Progressing Note:  Ambulated in hall and got up to the chair yesterday without difficulty.   Problem: Coping: Goal: Level of anxiety will decrease Outcome: Progressing

## 2018-03-20 NOTE — Discharge Summary (Addendum)
Physician Discharge Summary  Patient ID: Cody Phelps MRN: [id number] DOB/AGE: 1941-07-10 77 y.o.  Admit date: 03/15/2018 Discharge date: 03/20/2018  Admission Diagnoses:  Discharge Diagnoses:  Active Problems:   Severe aortic stenosis   Acute combined systolic and diastolic heart failure ([name])   Post-MI pericarditis ([name])   Pericardial effusion   Discharged Condition: stable  Hospital Course:   77 y/o Caucasian male with severe aortic stenosis recent late presentation MI due to occluded LAD, paroxysmal atrial flutter with controlled ventricular response, systolic and diastolic heart failure, discharged on 03/05/2018, admitted with shortness of breath and cough.  Workup showed post MI pericarditis with moderate posteriorly located pericardial effusion. Given the location, it was felt that it would be best treated medically. He continues to have cough, which is likely secondary to pericarditis. I have encouraged him to use guafenisein-codeine syrup, and to sue incentive spirometer at home to avoid further atelectasis and pneumonia. He was diuresed net negative 2.8 L during this admission. Patient was seen by TAVR and is felt to be a candidate. His anemia was felt to be due to chronic disease, with low iron. IV orn was administered on 08/20 with PO iron on discharge. With regards to atrial flutter, he is on eliquis and low dose amiodarone.     Consults: TAVR, Hematology.  Significant Diagnostic Studies: labs:  Results for Cody Phelps, Cody Phelps" (MRN [id number]) as of 03/20/2018 09:49  Ref. Range 03/17/2018 10:13 03/18/2018 03:30 03/19/2018 10:43  WBC Latest Ref Range: 4.0 - 10.5 K/uL  9.3 9.4  RBC Latest Ref Range: 4.22 - 5.81 MIL/uL  2.95 (L) 3.13 (L)  Hemoglobin Latest Ref Range: 13.0 - 17.0 g/dL  8.0 (L) 8.4 (L)  HCT Latest Ref Range: 39.0 - 52.0 %  25.3 (L) 26.7 (L)  MCV Latest Ref Range: 78.0 - 100.0 fL  85.8 85.3  MCH Latest Ref Range: 26.0 - 34.0 pg  27.1 26.8  MCHC Latest Ref Range:  30.0 - 36.0 g/dL  31.6 31.5  RDW Latest Ref Range: 11.5 - 15.5 %  15.3 15.3  Platelets Latest Ref Range: 150 - 400 K/uL  336 348  Neutrophils Latest Units: %   83  Lymphocytes Latest Units: %   6  Monocytes Relative Latest Units: %   10  Eosinophil Latest Units: %   0  Basophil Latest Units: %   0  NEUT# Latest Ref Range: 1.7 - 7.7 K/uL   7.8 (H)  Lymphocyte # Latest Ref Range: 0.7 - 4.0 K/uL   0.5 (L)  Monocyte # Latest Ref Range: 0.1 - 1.0 K/uL   1.0  Eosinophils Absolute Latest Ref Range: 0.0 - 0.7 K/uL   0.0  Basophils Absolute Latest Ref Range: 0.0 - 0.1 K/uL   0.0  Smear Review Unknown   SMEAR STAINED AND AVAILABLE FOR REVIEW  RBC. Latest Ref Range: 4.22 - 5.81 MIL/uL 3.12 (L)    Retic Ct Pct Latest Ref Range: 0.4 - 3.1 % 2.2    Retic Count, Absolute Latest Ref Range: 19.0 - 186.0 K/uL 68.6    Immature Granulocytes Latest Units: %   1  Abs Immature Granulocytes Latest Ref Range: 0.0 - 0.1 K/uL   0.1   Results for Cody Phelps, Cody Phelps" (MRN [id number]) as of 03/20/2018 09:49  Ref. Range 03/16/2018 04:15 03/17/2018 04:54 03/17/2018 10:13 03/18/2018 37:34  BASIC METABOLIC PANEL Unknown Rpt (A) Rpt (A)  Rpt (A)  Sodium Latest Ref Range: 135 - 145 mmol/L 138 135  135  Potassium Latest Ref Range: 3.5 - 5.1 mmol/L 4.1 3.7  3.7  Chloride Latest Ref Range: 98 - 111 mmol/L 104 105  102  CO2 Latest Ref Range: 22 - 32 mmol/L 22 22  22   Glucose Latest Ref Range: 70 - 99 mg/dL 127 (H) 125 (H)  137 (H)  BUN Latest Ref Range: 8 - 23 mg/dL 20 24 (H)  27 (H)  Creatinine Latest Ref Range: 0.61 - 1.24 mg/dL 1.05 1.11  1.19  Calcium Latest Ref Range: 8.9 - 10.3 mg/dL 8.7 (L) 8.2 (L)  8.2 (L)  Anion gap Latest Ref Range: 5 - 15  12 8  11   GFR, Est Non African American Latest Ref Range: >60 mL/min >60 >60  58 (L)  GFR, Est African American Latest Ref Range: >60 mL/min >60 >60  >60  Iron Latest Ref Range: 45 - 182 ug/dL   28 (L)   UIBC Latest Units: ug/dL   269   TIBC Latest Ref Range: 250 - 450 ug/dL    297   Saturation Ratios Latest Ref Range: 17.9 - 39.5 %   9 (L)   Ferritin Latest Ref Range: 24 - 336 ng/mL   408 (H)   Folate Latest Ref Range: >5.9 ng/mL   16.0   Vitamin B12 Latest Ref Range: 180 - 914 pg/mL   285    Echocardiogram 03/19/2018: Study Conclusions  - Left ventricle: The cavity size was mildly reduced. There was   mild concentric hypertrophy. Systolic function was mildly to   moderately reduced. The estimated ejection fraction was in the   range of 40% to 45%. Akinesis of the mid-apicalanterior,   inferoseptal, and apical myocardium. The study is not technically   sufficient to allow evaluation of LV diastolic function. - Aortic valve: There was moderate stenosis. Mean gradient (S): 37   mm Hg. Peak gradient (S): 65 mm Hg. Valve area (VTI): 1.06 cm^2.   Valve area (Vmax): 1.07 cm^2. Valve area (Vmean): 1.02 cm^2. - Mitral valve: Calcified annulus. Moderately calcified leaflets .   The findings are consistent with trivial stenosis. Valve area by   continuity equation (using LVOT flow): 1.85 cm^2. - Pericardium, extracardiac: A small to moderate, loculated, mostly   posterior (2cm) and minimally anterior pericardial effusion was   identified, with an appearance consistent with hemopericardium.   The fluid contained minimal focal strands. There was no evidence   of hemodynamic compromise. - Compared to 03/02/18. No significant change in wall motion.   Pericardial effusion is new. AVA was calculated at 0.8 cm2. mean   PG was 37 mm Hg. CHange in AVA may be due to technical reasons   and poor echo window. AV appears severely restricted in opening.  CT chest/abdomen/pelvis 03/17/2018:  IMPRESSION: 1. Vascular findings and measurements pertinent to potential TAVR procedure, as detailed above. 2. Severe thickening calcifications of the aortic valve, compatible with the reported clinical history of severe aortic stenosis. 3. Aortic atherosclerosis, in addition to left main  and 3 vessel coronary artery disease. 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 Importantly, there is also a moderate to large volume of pericardial fluid and pericardial enhancement, which could indicate post infarct pericarditis. 4. Moderate bilateral pleural effusions lying dependently with extensive passive atelectasis in the dependent portions of the lower lobes of the lungs bilaterally. 5. Severe calcifications of the mitral annulus also noted. 6. Mild colonic diverticulosis without  evidence to suggest an acute diverticulitis at this time. 7. Additional incidental findings, as above.   Treatments: IV diuresis  Discharge Exam: Blood pressure 120/72, pulse 79, temperature 99.1 F (37.3 C), temperature source Oral, resp. rate 18, height 5\' 9"  (1.753 m), weight 95.8 kg, SpO2 95 %. Constitutional: He is oriented to person, place, and time.  He is moderately built and  mildly obese in no acute distress.  HENT:  Head: Atraumatic.  Eyes: Conjunctivae are normal.  Neck: Neck supple. No JVD present. No thyromegaly present.  Cardiovascular:  S1 is normal, S2 is muffled.  Crescendo decrescendo 3/6 midsystolic murmur heard at the apex and right sternal border.  No gallop.  Pulmonary/Chest: Effort normal. No stridor. He has rales (at bases).  Abdominal: Soft. Bowel sounds are normal.  Musculoskeletal: Normal range of motion. He exhibits no edema.  Neurological: He is alert and oriented to person, place, and time.  Skin: Skin is warm and dry.  Psychiatric: He has a normal mood and affect.   Disposition: Discharge disposition: 01-Home or Self Care       Discharge Instructions    Diet - low sodium heart healthy   Complete by:  As directed    Increase activity slowly   Complete by:  As directed      Allergies as of 03/20/2018   No Known Allergies     Medication List     STOP taking these medications   amoxicillin-clavulanate 500-125 MG tablet Commonly known as:  AUGMENTIN   tiZANidine 2 MG tablet Commonly known as:  ZANAFLEX   valsartan 160 MG tablet Commonly known as:  DIOVAN     TAKE these medications   acetaminophen 325 MG tablet Commonly known as:  TYLENOL Take 1 tablet (325 mg total) by mouth every 4 (four) hours as needed (headache, pain). What changed:  reasons to take this   amiodarone 200 MG tablet Commonly known as:  PACERONE Take 1 tablet (200 mg total) by mouth 2 (two) times daily. What changed:  when to take this   apixaban 5 MG Tabs tablet Commonly known as:  ELIQUIS Take 1 tablet (5 mg total) by mouth 2 (two) times daily.   aspirin EC 81 MG tablet Take 81 mg by mouth daily.   atorvastatin 80 MG tablet Commonly known as:  LIPITOR Take 1 tablet (80 mg total) by mouth daily at 6 PM. What changed:  when to take this   colesevelam 625 MG tablet Commonly known as:  WELCHOL Take 1,875 mg by mouth daily.   donepezil 10 MG tablet Commonly known as:  ARICEPT Take 10 mg by mouth every morning.   esomeprazole 40 MG capsule Commonly known as:  NEXIUM Take 40 mg by mouth daily as needed (acid reflux).   ferrous sulfate 325 (65 FE) MG tablet Take 1 tablet (325 mg total) by mouth 3 (three) times daily with meals.   fluticasone 50 MCG/ACT nasal spray Commonly known as:  FLONASE Place 1 spray into both nostrils daily as needed for allergies or rhinitis.   guaiFENesin 100 MG/5ML Soln Commonly known as:  ROBITUSSIN Take 5 mLs (100 mg total) by mouth every 6 (six) hours as needed for cough or to loosen phlegm.   LANTUS SOLOSTAR 100 UNIT/ML injection Generic drug:  insulin glargine Inject 20-40 Units into the skin at bedtime as needed (for BGL 150 or greater).   memantine 5 MG tablet Commonly known as:  NAMENDA Take 5 mg by mouth 2 (two) times  daily.   metoprolol tartrate 25 MG tablet Commonly known as:  LOPRESSOR Take  0.5 tablets (12.5 mg total) by mouth 2 (two) times daily. What changed:  how much to take   nabumetone 750 MG tablet Commonly known as:  RELAFEN Take 750 mg by mouth 2 (two) times daily as needed for mild pain.   nitroGLYCERIN 0.4 MG SL tablet Commonly known as:  NITROSTAT Place 1 tablet (0.4 mg total) under the tongue every 5 (five) minutes as needed for chest pain.   omega-3 acid ethyl esters 1 g capsule Commonly known as:  LOVAZA Take 2 g by mouth daily.   rOPINIRole 2 MG tablet Commonly known as:  REQUIP Take 1-2 mg by mouth See admin instructions. Take 2 mg by mouth at bedtime and also 1-2 mg once during the day as needed for restless legs   spironolactone 25 MG tablet Commonly known as:  ALDACTONE Take 0.5 tablets (12.5 mg total) by mouth daily.   SYMBICORT 160-4.5 MCG/ACT inhaler Generic drug:  budesonide-formoterol Inhale 2 puffs into the lungs 2 (two) times daily.   tamsulosin 0.4 MG Caps capsule Commonly known as:  FLOMAX Take 0.4 mg by mouth daily after supper.   torsemide 20 MG tablet Commonly known as:  DEMADEX Take 1 tablet (20 mg total) by mouth 2 (two) times daily.      Follow-up Information    Adrian Prows, MD Follow up on 03/27/2018.   Specialty:  Cardiology Why:  4:00 PM Contact information: [place] [address] [phone]        [name], MD. Go on 04/16/2018.   Specialty:  Cardiothoracic Surgery Why:  @ 12:30pm. Please arrive at 11:30am at [facility] on first floor (same builiding) for a chest x ray prior to your appointment with Dr. Roxy Manns.  Contact information: [address] [place] [address] [phone]        [facility]. Go on 03/27/2018.   Why:  @ 2pm, please arrive at 1:45pm for an echocardiogram of your heart. Contact information: [place] [address] [phone]           Signed: Nigel Mormon 03/20/2018, 9:43 AM   Nigel Mormon, MD [facility] [place] Pager: [phone] Office: [phone] If no answer Cell [phone]

## 2018-03-20 NOTE — Progress Notes (Signed)
While removing patient's IV for discharge, RN noted upper arm swelling suspicious for IV infiltration-unsure how much IV iron patient actually received. RN notified Dr. Virgina Jock; orders to place ice to arm and monitor but no orders for any additional IV iron since patient is to be d/c'd on PO supplements.

## 2018-03-21 LAB — METHYLMALONIC ACID, SERUM: Methylmalonic Acid, Quantitative: 320 nmol/L (ref 0–378)

## 2018-03-27 ENCOUNTER — Other Ambulatory Visit: Payer: Self-pay | Admitting: Physician Assistant

## 2018-03-27 ENCOUNTER — Other Ambulatory Visit: Payer: Self-pay

## 2018-03-27 ENCOUNTER — Other Ambulatory Visit (HOSPITAL_COMMUNITY): Payer: Medicare Other

## 2018-03-27 ENCOUNTER — Ambulatory Visit (HOSPITAL_COMMUNITY): Payer: Medicare Other | Attending: Cardiovascular Disease

## 2018-03-27 DIAGNOSIS — I313 Pericardial effusion (noninflammatory): Secondary | ICD-10-CM

## 2018-03-27 DIAGNOSIS — G4733 Obstructive sleep apnea (adult) (pediatric): Secondary | ICD-10-CM | POA: Insufficient documentation

## 2018-03-27 DIAGNOSIS — I119 Hypertensive heart disease without heart failure: Secondary | ICD-10-CM | POA: Insufficient documentation

## 2018-03-27 DIAGNOSIS — I252 Old myocardial infarction: Secondary | ICD-10-CM | POA: Insufficient documentation

## 2018-03-27 DIAGNOSIS — I083 Combined rheumatic disorders of mitral, aortic and tricuspid valves: Secondary | ICD-10-CM | POA: Insufficient documentation

## 2018-03-27 DIAGNOSIS — E119 Type 2 diabetes mellitus without complications: Secondary | ICD-10-CM | POA: Diagnosis not present

## 2018-03-27 DIAGNOSIS — I253 Aneurysm of heart: Secondary | ICD-10-CM | POA: Insufficient documentation

## 2018-03-27 DIAGNOSIS — I4891 Unspecified atrial fibrillation: Secondary | ICD-10-CM | POA: Diagnosis not present

## 2018-03-27 DIAGNOSIS — J90 Pleural effusion, not elsewhere classified: Secondary | ICD-10-CM

## 2018-03-27 DIAGNOSIS — I3139 Other pericardial effusion (noninflammatory): Secondary | ICD-10-CM

## 2018-03-27 DIAGNOSIS — I5041 Acute combined systolic (congestive) and diastolic (congestive) heart failure: Secondary | ICD-10-CM

## 2018-04-03 ENCOUNTER — Encounter: Payer: Self-pay | Admitting: Thoracic Surgery (Cardiothoracic Vascular Surgery)

## 2018-04-03 ENCOUNTER — Institutional Professional Consult (permissible substitution) (INDEPENDENT_AMBULATORY_CARE_PROVIDER_SITE_OTHER): Payer: Medicare Other | Admitting: Thoracic Surgery (Cardiothoracic Vascular Surgery)

## 2018-04-03 ENCOUNTER — Ambulatory Visit
Admission: RE | Admit: 2018-04-03 | Discharge: 2018-04-03 | Disposition: A | Discharge status: Home / Self Care | Payer: Medicare Other | Source: Ambulatory Visit | Attending: Thoracic Surgery (Cardiothoracic Vascular Surgery) | Admitting: Thoracic Surgery (Cardiothoracic Vascular Surgery)

## 2018-04-03 VITALS — BP 112/69 | HR 74 | Resp 20 | Ht 69.0 in | Wt 192.0 lb

## 2018-04-03 DIAGNOSIS — I5041 Acute combined systolic (congestive) and diastolic (congestive) heart failure: Secondary | ICD-10-CM

## 2018-04-03 DIAGNOSIS — I313 Pericardial effusion (noninflammatory): Secondary | ICD-10-CM | POA: Diagnosis not present

## 2018-04-03 DIAGNOSIS — I2510 Atherosclerotic heart disease of native coronary artery without angina pectoris: Secondary | ICD-10-CM | POA: Insufficient documentation

## 2018-04-03 DIAGNOSIS — I3139 Other pericardial effusion (noninflammatory): Secondary | ICD-10-CM

## 2018-04-03 DIAGNOSIS — I350 Nonrheumatic aortic (valve) stenosis: Secondary | ICD-10-CM | POA: Diagnosis not present

## 2018-04-03 DIAGNOSIS — J90 Pleural effusion, not elsewhere classified: Secondary | ICD-10-CM

## 2018-04-03 NOTE — Progress Notes (Addendum)
HEART AND [place]  CARDIOTHORACIC SURGERY CONSULTATION REPORT  Referring Provider is Adrian Prows, MD PCP is [name], MD  Chief Complaint  Patient presents with  . 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  Patient states that he has known of presence of a heart murmur for several years.  He has been followed by Dr. [name] with previous echocardiograms documenting the presence of mild to moderate aortic stenosis and normal left ventricular systolic function.  The patient presented acutely with chest pain and shortness of breath on March 01, 2018 and was diagnosed with ST segment elevation myocardial infarction.  He was taken to the Cath Lab where he was found to have three-vessel coronary artery disease with subtotal occlusion of the mid to distal left anterior descending coronary artery.  He underwent attempted PCI but the procedure was aborted due to the presence of diffuse distal disease with organized thrombus consistent with late presentation of a completed transmural myocardial infarction.  The remainder of his coronary disease was treated medically.  During his subsequent convalescence he developed new onset paroxysmal atrial flutter and acute combined systolic and diastolic congestive heart failure.  Subsequent echocardiograms revealed  moderate to severe aortic stenosis and moderate left ventricular systolic dysfunction with akinesis of the distal anterior wall and apex.  He recovered without subsequent complication and was discharged home on aspirin, Eliquis and amiodarone.  He was readmitted to the hospital August 15 through March 20, 2018 with shortness of breath consistent with worsening cough and shortness of breath felt consistent with acute on chronic combined systolic and diastolic congestive heart failure.  He was notably in atrial fibrillation at that time.  Follow-up echocardiogram performed at that time revealed no significant change in left ventricular function with ejection fraction estimated 40 to 45% and akinesis of the mid to distal anterior wall and apex.  A small to moderate sized partially loculated pericardial effusion was noted and felt to be consistent with hemopericardium related to likely pericarditis from the patient's recent myocardial infarction.  There is no sign of hemodynamic compromise.  Since hospital discharge she was slowly improving.  A third follow-up echocardiogram was performed March 27, 2018 which revealed further decrease size and the patient's pericardial effusion.  Of note, transvalvular gradient across the aortic valve appeared somewhat higher on the most recent echocardiogram, suggestive of improved cardiac output.  There was also question of possible mild to moderate mitral stenosis.  The patient was referred for elective surgical consultation.  The patient is married and lives locally in [place] with his wife.  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  He has remained reasonably active and functionally independent until approximately 1 year ago.  Over the past year he has had several medical problems including prostate cancer for which he underwent radioactive seed implantation last December.  He underwent right total hip replacement  in March.  He slowly recovered  from hip replacement and was doing reasonably well until August 1 when he suffered his acute myocardial infarction.  He states that over the last few weeks he has been slowly improving but he is still nowhere near his functional baseline.  He gets short of breath and tired with low-level activity.  He denies resting shortness of breath.  He has not had any exertional chest pain or chest tightness.  He feels quite weak.  He notes that his gait is somewhat unstable and he walks using a cane or walker.  He has significantly decreased energy.  He gets short of breath when he lays flat in bed.  He has not had PND.  He has not had lower extremity edema.  He has a persistent dry cough that has been slowly improving but has not resolved.  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  He states that his mind feels "foggy" and that this has been worse since his heart attack.  Past Medical History:  Diagnosis Date  . Carotid stenosis, left followed by dr [name], cardiologist--- bilateral bruit per note   per dr [name] note [id number]  LICA 28-76% stenosis per duplex 05-14-2014  . Diverticulosis   . ED (erectile dysfunction)   . First degree heart block   . GERD (gastroesophageal reflux disease)   . Hypertension    cardiologsit-  dr [name]  . Iron deficiency anemia   . Mixed hyperlipidemia   . OA (osteoarthritis)    right hip  . OSA on CPAP    followed by dr dohmeier, uses CPAP  . PAF (paroxysmal atrial fibrillation) ([name])    a. on Eliquis  . Prostate cancer ([place])    dx 03-17-2017 (bx)  Stage T2a, Gleason 4+4, PSA  4.43, vol 32.32--  s/p radiatctive prostate seed implants 07-10-2017 then IMRT and ADT  . RLS (restless legs syndrome)   . Scoliosis   . Severe aortic stenosis   . Trigger finger   . Type 2 diabetes mellitus ([place])   . Wears partial dentures    upper and lower    Past Surgical History:  Procedure Laterality Date    . APPENDECTOMY  1988  . BILATERAL TOTAL ETHMOIDECTOMY AND SPHENOIDECTOMY  01-14-2009    dr bates   [facility]  . CARPAL TUNNEL RELEASE Bilateral 2013  . CATARACT EXTRACTION W/ INTRAOCULAR LENS  IMPLANT, BILATERAL  date?  . CORONARY BALLOON ANGIOPLASTY N/A 03/01/2018   Procedure: CORONARY BALLOON ANGIOPLASTY;  Surgeon: Adrian Prows, MD;  Location: [place] [facility];  Service: Cardiovascular;  Laterality: N/A;  . CORONARY/GRAFT ACUTE MI REVASCULARIZATION N/A 03/01/2018   Procedure: Coronary/Graft Acute MI Revascularization;  Surgeon: Adrian Prows, MD;  Location: [place] [facility];  Service: Cardiovascular;  Laterality: N/A;  . CYSTOSCOPY  07/19/2017   Procedure: CYSTOSCOPY;  Surgeon: Nickie Retort, MD;  Location: [facility];  Service: Urology;;  No seeds found in [place]  . LAMINECTOMY WITH POSTERIOR LATERAL ARTHRODESIS LEVEL 2 N/A 02/12/2014   Procedure: LUMBAR TWO-THREE,LUIMBAR THREE-FOUR LAMINECTOMY/FORAMINOTOMY;POSSIBLE POSTEROLATERAL ARTHRODESIS WITH AUTOGRAFT;  Surgeon: [name], MD;  Location: [facility];  Service: Neurosurgery;  Laterality: N/A;  . LEFT HEART CATH AND CORONARY ANGIOGRAPHY N/A 03/01/2018   Procedure: LEFT HEART CATH AND CORONARY ANGIOGRAPHY;  Surgeon: Adrian Prows, MD;  Location: [place] [facility];  Service: Cardiovascular;  Laterality: N/A;  . ORIF RIGHT ANKLE FX'S  05/05/2001   retained hardware  . PROSTATE BIOPSY  03-17-2017  dr Pilar Jarvis office  . RADIOACTIVE SEED IMPLANT N/A 07/19/2017   Procedure: RADIOACTIVE SEED IMPLANT/BRACHYTHERAPY IMPLANT;  Surgeon: Nickie Retort, MD;  Location[facility];  Service: Urology;  Laterality: N/A;  69 seeds implanted  . SPACE OAR INSTILLATION N/A 07/19/2017   Procedure: SPACE OAR INSTILLATION;  Surgeon: Nickie Retort, MD;  Location: [facility];  Service: Urology;  Laterality: N/A;  . TEE WITHOUT CARDIOVERSION  03/20/2012   Procedure: TRANSESOPHAGEAL ECHOCARDIOGRAM (TEE);   Surgeon: Laverda Page, MD;  Location: [facility];  Service: Cardiovascular;  Laterality: N/A;  normal LV; normal EF; normal RV; normal LA w/ left atrial appendage very small, normal function, interatrial septum intact without defect; normal RA; trace MR,TR, & PI; mild AV calcification and senile degeneration w/ mild stenosis, AVA 1.7cm^2;;   . TOTAL HIP ARTHROPLASTY Right 10/23/2017   Procedure: TOTAL HIP ARTHROPLASTY ANTERIOR APPROACH;  Surgeon: Frederik Pear, MD;  Location: [place];  Service: Orthopedics;  Laterality: Right;  . TRANSTHORACIC ECHOCARDIOGRAM  01-11-2017   dr [name]  (per echo note, no significant change in seveity of AS, no other diagnostic change)   moderate concentric LVH, ef 16%, grade 1 diastolic dysfunction/  moderate LAE/  mild , grade 1 AR w/ moderate AV calcification, mild to moderate restricted AV leaflets w/ moderate AS, AVA 1.16cm^2, peak grandient 2mHg, mean grandient 330mg/  trace MR, mild calcification MV annulus , mild MV leaflet calcification, mild MVS, peak grandient 4.26m43m, mean grandient 2.7mm48m trace TR    Family History  Problem Relation Age of Onset  . Stroke Mother   . Hypertension Mother   . Heart attack Father   . Heart murmur Father   . Hypertension Sister     Social History   Socioeconomic History  . Marital status: Married    Spouse name: DonnButch PennyNumber of children: 2  . Years of education: 14  59Highest education level: Not on file  Occupational History  . Not on file  Social Needs  . Financial resource strain: Not on file  . Food insecurity:    Worry: Not on file    Inability: Not on file  . Transportation needs:    Medical: Not on file    Non-medical: Not on file  Tobacco Use  . Smoking status: Former Smoker    Years: 1.00    Types: Cigarettes    Last attempt to quit: 02/04/1967    Years since quitting: 51.1  . Smokeless tobacco: Never Used  . Tobacco comment: smoked 1 q2-3 days  Substance and Sexual Activity  . Alcohol  use: Yes    Comment: occas.  . Drug use: No  . Sexual activity: Yes  Lifestyle  . Physical activity:    Days per week: Not on file    Minutes per session: Not on file  . Stress: Not on file  Relationships  . Social connections:    Talks on phone: Not on file    Gets together: Not on file    Attends religious service: Not on file    Active member of club or organization: Not on file    Attends meetings of clubs or organizations: Not on file    Relationship status: Not on file  . Intimate partner violence:    Fear of current or ex partner: Not on file    Emotionally abused: Not on file    Physically abused: Not on file    Forced sexual activity: Not on  file  Other Topics Concern  . Not on file  Social History Narrative   Patient is married Butch Penny).   Patient has two children.   Patient is retired.   Patient has a college education.   Patient is right-handed.   Patient drinks two cups of coffee per day and 2-3 cups of Diet soda daily and limited tea.          Current Outpatient Medications  Medication Sig Dispense Refill  . acetaminophen (TYLENOL) 325 MG tablet Take 1 tablet (325 mg total) by mouth every 4 (four) hours as needed (headache, pain). (Patient taking differently: Take 325 mg by mouth every 4 (four) hours as needed (headaches or pain). ) 60 tablet 3  . amiodarone (PACERONE) 200 MG tablet Take 1 tablet (200 mg total) by mouth 2 (two) times daily. 30 tablet 0  . apixaban (ELIQUIS) 5 MG TABS tablet Take 1 tablet (5 mg total) by mouth 2 (two) times daily. 60 tablet 3  . aspirin EC 81 MG tablet Take 81 mg by mouth daily.     Marland Kitchen atorvastatin (LIPITOR) 80 MG tablet Take 1 tablet (80 mg total) by mouth daily at 6 PM. (Patient taking differently: Take 80 mg by mouth every evening. ) 60 tablet 3  . colesevelam (WELCHOL) 625 MG tablet Take 1,875 mg by mouth daily.     Marland Kitchen donepezil (ARICEPT) 10 MG tablet Take 10 mg by mouth every morning.     Marland Kitchen esomeprazole (NEXIUM) 40 MG  capsule Take 40 mg by mouth daily as needed (acid reflux).     . ferrous sulfate 325 (65 FE) MG tablet Take 1 tablet (325 mg total) by mouth 3 (three) times daily with meals. 60 tablet 3  . fluticasone (FLONASE) 50 MCG/ACT nasal spray Place 1 spray into both nostrils daily as needed for allergies or rhinitis.    Marland Kitchen guaiFENesin (ROBITUSSIN) 100 MG/5ML SOLN Take 5 mLs (100 mg total) by mouth every 6 (six) hours as needed for cough or to loosen phlegm. 1200 mL 2  . insulin glargine (LANTUS SOLOSTAR) 100 UNIT/ML injection Inject 20-40 Units into the skin at bedtime as needed (for BGL 150 or greater).     . memantine (NAMENDA) 5 MG tablet Take 5 mg by mouth 2 (two) times daily.    . metoprolol tartrate (LOPRESSOR) 25 MG tablet Take 0.5 tablets (12.5 mg total) by mouth 2 (two) times daily. 60 tablet 3  . nabumetone (RELAFEN) 750 MG tablet Take 750 mg by mouth 2 (two) times daily as needed for mild pain.    . nitroGLYCERIN (NITROSTAT) 0.4 MG SL tablet Place 1 tablet (0.4 mg total) under the tongue every 5 (five) minutes as needed for chest pain. 30 tablet 3  . omega-3 acid ethyl esters (LOVAZA) 1 G capsule Take 2 g by mouth daily.     Marland Kitchen rOPINIRole (REQUIP) 2 MG tablet Take 1-2 mg by mouth See admin instructions. Take 2 mg by mouth at bedtime and also 1-2 mg once during the day as needed for restless legs    . spironolactone (ALDACTONE) 25 MG tablet Take 0.5 tablets (12.5 mg total) by mouth daily. 30 tablet 3  . SYMBICORT 160-4.5 MCG/ACT inhaler Inhale 2 puffs into the lungs 2 (two) times daily.    . tamsulosin (FLOMAX) 0.4 MG CAPS capsule Take 0.4 mg by mouth daily after supper.     . torsemide (DEMADEX) 20 MG tablet Take 1 tablet (20 mg total) by mouth 2 (two)  times daily. 60 tablet 3   No current facility-administered medications for this visit.     No Known Allergies    Review of Systems:   General:  decreased appetite, decreased energy, no weight gain, no weight loss, no fever  Cardiac:  no  chest pain with exertion, no chest pain at rest, +SOB with exertion, no resting SOB, no PND, + orthopnea, no palpitations, + arrhythmia, + atrial fibrillation, no LE edema, no dizzy spells, no syncope  Respiratory:  + shortness of breath, no home oxygen, no productive cough, + dry cough, + bronchitis, no wheezing, no hemoptysis, no asthma, no pain with inspiration or cough, + sleep apnea, + CPAP at night  GI:   no difficulty swallowing, no reflux, no frequent heartburn, no hiatal hernia, no abdominal pain, + constipation, no diarrhea, no hematochezia, no hematemesis, no melena, + black stools since taking iron  GU:   no dysuria,  + frequency, no urinary tract infection, no hematuria, no enlarged prostate, no kidney stones, no kidney disease  Vascular:  no pain suggestive of claudication, no pain in feet, + leg cramps, no varicose veins, no DVT, no non-healing foot ulcer  Neuro:   no stroke, no TIA's, no seizures, no headaches, no temporary blindness one eye,  no slurred speech, no peripheral neuropathy, no chronic pain, + mild instability of gait, + mild memory/cognitive dysfunction  Musculoskeletal: + arthritis, no joint swelling, no myalgias, + some difficulty walking, decreased mobility   Skin:   no rash, no itching, no skin infections, no pressure sores or ulcerations  Psych:   no anxiety, + depression, no nervousness, no unusual recent stress  Eyes:   no blurry vision, + floaters, no recent vision changes, + wears glasses or contacts  ENT:   no hearing loss, no loose or painful teeth, no dentures, last saw dentist within the past year  Hematologic:  no easy bruising, no abnormal bleeding, no clotting disorder, no frequent epistaxis  Endocrine:  + diabetes, does check CBG's at home           Physical Exam:   BP 112/69   Pulse 74   Resp 20   Ht 5' 9" (1.753 m)   Wt 192 lb (87.1 kg)   SpO2 95% Comment: RA  BMI 28.35 kg/m   General:  Well nourished male NAD but somewhat  frail-appearing  HEENT:  Unremarkable   Neck:   no JVD, no bruits, no adenopathy   Chest:   clear to auscultation, symmetrical breath sounds, no wheezes, no rhonchi   CV:   RRR, grade III/VI crescendo/decrescendo murmur heard best at LSB,  no diastolic murmur  Abdomen:  soft, non-tender, no masses   Extremities:  warm, well-perfused, pulses diminished but palpable, no LE edema  Rectal/GU  Deferred  Neuro:   Grossly non-focal and symmetrical throughout  Skin:   Clean and dry, no rashes, no breakdown   Diagnostic Tests:  Transthoracic Echocardiography  Patient:    Darrian, Grzelak MR #:       [id number] Study Date: 03/27/2018 Gender:     M Age:        77 Height:     175.3 cm Weight:     95.8 kg BSA:        2.19 m^2 Pt. Status: Room:   [place], Brice Croitoru, MD  SONOGRAPHER  Marygrace Drought, RCS  PERFORMING   [facility], Outpatient  ORDERING  Nell Range  Sharl Ma, Katie  cc:  ------------------------------------------------------------------- LV EF: 45% -   50%  ------------------------------------------------------------------- Indications:      Pericardial Effusion (I31.3).  ------------------------------------------------------------------- History:   PMH:  OSA.  Dyspnea.  Atrial fibrillation.  PMH: Myocardial infarction.  Risk factors:  Hypertension. Diabetes mellitus.  ------------------------------------------------------------------- Study Conclusions  - Left ventricle: The cavity size was normal. There was moderate   concentric hypertrophy. Systolic function was mildly reduced. The   estimated ejection fraction was in the range of 45% to 50%.   Akinesis and aneurysmal deformity of the apical myocardium;   consistent with infarction in the distribution of the left   anterior descending coronary artery. Doppler parameters are   consistent with abnormal left ventricular relaxation (grade 1    diastolic dysfunction). Doppler parameters are consistent with   elevated mean left atrial filling pressure. - Aortic valve: There was severe stenosis. Valve area (VTI): 0.89   cm^2. - Mitral valve: Moderately calcified annulus. Severely thickened,   mildly calcified leaflets . The findings are consistent with mild   to moderate stenosis. There was mild regurgitation. Valve area by   pressure half-time: 1.82 cm^2. Valve area by continuity equation   (using LVOT flow): 2.46 cm^2. - Left atrium: The atrium was severely dilated. - Pulmonary arteries: Systolic pressure was mildly increased. PA   peak pressure: 34 mm Hg (S). - Pericardium, extracardiac: A small pericardial effusion was   identified circumferential to the heart. There was no evidence of   hemodynamic compromise.  Impressions:  - The previously described pericardial effusion is smaller. Aortic   valve gradients are higher, possibly due to higher cardiac   output. Direct comparison with previous images shows no change in   wall motion, consistent with transmural infarction in the distal   LAD artery teritory. Mitral stenosis parameters are consistent   with previous measurements, but signify mild-moderate mitral   stenosis.  ------------------------------------------------------------------- Study data:  Comparison was made to the study of 03/09/2018.  Study status:  Routine.  Procedure:  The patient reported no pain pre or post test. Transthoracic echocardiography. Image quality was adequate.          Transthoracic echocardiography.  M-mode, complete 2D, spectral Doppler, and color Doppler.  Birthdate: Patient birthdate: 1941/04/09.  Age:  Patient is 77 yr old.  Sex: Gender: male.    BMI: 31.2 kg/m^2.  Blood pressure:     125/69 Patient status:  Outpatient.  Study date:  Study date: 03/27/2018. Study time: 01:45 PM.  Location:  [place] [facility]  3  -------------------------------------------------------------------  ------------------------------------------------------------------- Left ventricle:  The cavity size was normal. There was moderate concentric hypertrophy. Systolic function was mildly reduced. The estimated ejection fraction was in the range of 45% to 50%. Regional wall motion abnormalities:   Akinesis and aneurysmal deformity of the apical myocardium; consistent with infarction in the distribution of the left anterior descending coronary artery. Doppler parameters are consistent with abnormal left ventricular relaxation (grade 1 diastolic dysfunction). Doppler parameters are consistent with elevated mean left atrial filling pressure.  ------------------------------------------------------------------- Aortic valve:   Probably trileaflet; moderately thickened, severely calcified leaflets.  Doppler:   There was severe stenosis.      VTI ratio of LVOT to aortic valve: 0.28. Valve area (VTI): 0.89 cm^2. Indexed valve area (VTI): 0.41 cm^2/m^2. Peak velocity ratio of LVOT to aortic valve: 0.27. Valve area (Vmax): 0.84 cm^2. Indexed valve area (Vmax): 0.39 cm^2/m^2. Mean velocity ratio of LVOT to aortic  valve: 0.27. Valve area (Vmean): 0.86 cm^2. Indexed valve area (Vmean): 0.39 cm^2/m^2.    Mean gradient (S): 49 mm Hg. Peak gradient (S): 86 mm Hg.  ------------------------------------------------------------------- Aorta:  Aortic root: The aortic root was normal in size. Ascending aorta: The ascending aorta was normal in size.  ------------------------------------------------------------------- Mitral valve:   Moderately calcified annulus. Severely thickened, mildly calcified leaflets . Gradients measured at 70 bpm. Gradients at 70 bpm.  Doppler:   The findings are consistent with mild to moderate stenosis.   There was mild regurgitation.    Valve area by pressure half-time: 1.82 cm^2. Indexed valve area by  pressure half-time: 0.83 cm^2/m^2. Valve area by continuity equation (using LVOT flow): 2.46 cm^2. Indexed valve area by continuity equation (using LVOT flow): 1.12 cm^2/m^2.    Mean gradient (D): 6 mm Hg. Peak gradient (D): 9 mm Hg.  ------------------------------------------------------------------- Left atrium:  The atrium was severely dilated.  ------------------------------------------------------------------- Right ventricle:  The cavity size was normal. Systolic function was normal.  ------------------------------------------------------------------- Pulmonic valve:   Poorly visualized.  The valve appears to be grossly normal.    Doppler:  Transvalvular velocity was within the normal range. There was no evidence for stenosis. There was no significant regurgitation.  ------------------------------------------------------------------- Tricuspid valve:   Structurally normal valve.   Leaflet separation was normal.  Doppler:  Transvalvular velocity was within the normal range. There was mild regurgitation.  ------------------------------------------------------------------- Pulmonary artery:   Systolic pressure was mildly increased.  ------------------------------------------------------------------- Right atrium:  The atrium was normal in size.  ------------------------------------------------------------------- Pericardium:  A small pericardial effusion was identified circumferential to the heart. There was no evidence of hemodynamic compromise.  ------------------------------------------------------------------- Systemic veins: Inferior vena cava: The vessel was normal in size. The respirophasic diameter changes were in the normal range (= 50%), consistent with normal central venous pressure. Diameter: 18 mm.  ------------------------------------------------------------------- Measurements   IVC                                     Value           Reference   ID                                      18     mm       ----------    Left ventricle                          Value           Reference  LV ID, ED, PLAX chordal          (L)    37.6   mm       43 - 52  LV ID, ES, PLAX chordal          (L)    22.9   mm       23 - 38  LV fx shortening, PLAX chordal          39     %        >=29  LV PW thickness, ED                     16.5   mm       ----------  IVS/LV PW ratio, ED  1               <=1.3  Stroke volume, 2D                       100    ml       ----------  Stroke volume/bsa, 2D                   46     ml/m^2   ----------  LV filling time, D, DP                  350    ms       ----------  LV e&', lateral                          4.03   cm/s     ----------  LV E/e&', lateral                        37.82           ----------  LV e&', medial                           5.44   cm/s     ----------  LV E/e&', medial                         28.02           ----------  LV e&', average                          4.74   cm/s     ----------  LV E/e&', average                        32.19           ----------  Longitudinal strain, TDI                10     %        ----------    Ventricular septum                      Value           Reference  IVS thickness, ED                       16.5   mm       ----------    LVOT                                    Value           Reference  LVOT ID, S                              20     mm       ----------  LVOT area                               3.14   cm^2     ----------  LVOT peak velocity, S                   125    cm/s     ----------  LVOT mean velocity, S                   89.6   cm/s     ----------  LVOT VTI, S                             31.9   cm       ----------  LVOT peak gradient, S                   6      mm Hg    ----------    Aortic valve                            Value           Reference  Aortic valve peak velocity, S           465    cm/s     ----------  Aortic valve mean  velocity, S           328    cm/s     ----------  Aortic valve VTI, S                     113    cm       ----------  Aortic mean gradient, S                 49     mm Hg    ----------  Aortic peak gradient, S                 86     mm Hg    ----------  VTI ratio, LVOT/AV                      0.28            ----------  Aortic valve area, VTI                  0.89   cm^2     ----------  Aortic valve area/bsa, VTI              0.41   cm^2/m^2 ----------  Velocity ratio, peak, LVOT/AV           0.27            ----------  Aortic valve area, peak velocity        0.84   cm^2     ----------  Aortic valve area/bsa, peak             0.39   cm^2/m^2 ----------  velocity  Velocity ratio, mean, LVOT/AV           0.27            ----------  Aortic valve area, mean velocity        0.86   cm^2     ----------  Aortic valve area/bsa, mean             0.39   cm^2/m^2 ----------  velocity  Aortic regurg pressure half-time        416    ms       ----------  Aorta                                   Value           Reference  Aortic root ID, ED                      31     mm       ----------    Left atrium                             Value           Reference  LA ID, A-P, ES                          54     mm       ----------  LA ID/bsa, A-P                   (H)    2.47   cm/m^2   <=2.2  LA volume, S                            118    ml       ----------  LA volume/bsa, S                        54     ml/m^2   ----------  LA volume, ES, 1-p A4C                  111    ml       ----------  LA volume/bsa, ES, 1-p A4C              50.8   ml/m^2   ----------  LA volume, ES, 1-p A2C                  114    ml       ----------  LA volume/bsa, ES, 1-p A2C              52.1   ml/m^2   ----------    Mitral valve                            Value           Reference  Mitral E-wave peak velocity             152.41 cm/s     ----------  Mitral A-wave peak velocity             141    cm/s     ----------  Mitral mean  velocity, D                 116.83 cm/s     ----------  Mitral deceleration slope               366.52 cm/s^2   ----------  Mitral deceleration time         (H)    416    ms       150 - 230  Mitral pressure half-time  121    ms       ----------  Mitral mean gradient, D                 6      mm Hg    ----------  Mitral peak gradient, D                 9      mm Hg    ----------  Mitral E/A ratio, peak                  1.08            ----------  Mitral valve area, PHT, DP              1.82   cm^2     ----------  Mitral valve area/bsa, PHT, DP          0.83   cm^2/m^2 ----------  Mitral valve area, LVOT                 2.46   cm^2     ----------  continuity  Mitral valve area/bsa, LVOT             1.12   cm^2/m^2 ----------  continuity  Mitral annulus VTI, D                   50.1   cm       ----------    Pulmonary arteries                      Value           Reference  PA pressure, S, DP               (H)    34     mm Hg    <=30    Tricuspid valve                         Value           Reference  Tricuspid regurg peak velocity          278    cm/s     ----------  Tricuspid peak RV-RA gradient           31     mm Hg    ----------  Tricuspid maximal regurg                278    cm/s     ----------  velocity, PISA    Right atrium                            Value           Reference  RA ID, S-I, ES, A4C              (H)    55.5   mm       34 - 49  RA area, ES, A4C                 (H)    20.2   cm^2     8.3 - 19.5  RA volume, ES, A/L                      61.9   ml       ----------  RA volume/bsa, ES, A/L                  28.3   ml/m^2   ----------    Systemic veins                          Value           Reference  Estimated CVP                           3      mm Hg    ----------    Right ventricle                         Value           Reference  TAPSE                                   22     mm       ----------  RV pressure, S, DP               (H)    34     mm Hg     <=30  RV s&', lateral, S                       10     cm/s     ----------  Legend: (L)  and  (H)  mark values outside specified reference range.  ------------------------------------------------------------------- Prepared and Electronically Authenticated by  Sanda Klein, MD 2019-08-27T16:10:18   Coronary/Graft Acute MI Revascularization  CORONARY BALLOON ANGIOPLASTY  LEFT HEART CATH AND CORONARY ANGIOGRAPHY  Conclusion   Coronary angiogram 03/20/2018: Left main mildly calcified but normal, LAD large caliber vessel, mild to moderate diffuse disease followed by occlusion in the mid to distal segment, distally the LAD appears to be diffusely diseased with TIMI I flow.  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  D1 is small, D-2 is moderate to  large sized with tandem 80 to 90% stenosis, mild to moderate calcification.  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 RCA small, non-dominant and diffusely diseased without high-grade stenosis. Severe aortic stenosis with peak to peak gradient of 69 and mean gradient of 53 mmHg.  Severely calcified mitral annulus and moderately calcified aortic valve.  Recommend uninterrupted dual antiplatelet therapy with Aspirin 70m daily and Ticagrelor 966mtwice daily for a minimum of 1 month (bare metal stent - Class I recommendation).  This is being done for STEMI with completed anterior wall infarct until he recovers and will be considered for either TAVR or CABG, high-grade diagonal 2 disease with aortic valve replacement.  He will need to be evaluated for possible pneumonia with pleuritic chest pain component along with a temperature of 101 F.  185 ml contrast utilized.  Indications   Acute MI anterior wall first episode care ([facility][I21.09 (ICD-10-CM)]  Procedural Details/Technique    Technical Details Procedure performed:  Emergent left heart catheterization including hemodynamic monitoring of the left ventricle, LV gram. Selective right and left coronary arteriography. PTCA and attempted PCI of the mid LAD aborted due to  subacute thrombotic occlusion and distal guide wire position to be true luminal could not be confirmed.  Indication: NICKLOS GAXIOLA is a 77 y.o. male with history of hypertension, hyperlipidemia, Diabetes Mellitus, moderate AS who presents with chest pain that started yesterday 9 am. He also had cough and fever of 101 last night. EKG revealed new anterior STEMI with Q waves, due to ongoing CP patient taken to the Cath Lab on emergent basis.  Technique of diagnostic cardiac catheterization: Under sterile precautions using a 6 French right radial arterial access, a 6 French sheath was introduced into the right radial artery. A 5 [place] Tig 4 catheter was advanced into the ascending aorta selective left coronary artery was cannulated and angiography was performed in multiple views. The catheter was pulled back Out of the body over exchange length J-wire. PCI attempted as below prior to engaging RCA. JR 4 Catheter was used to perform LV gram which was performed in LAO and RAO projection. Catheter exchanged out of the body over J-Wire. NO immediate complications noted. Hemostasis achieved with TR band. Patient tolerated the procedure well.   Technique of intervention:  Using a 6 [place] XB 3.5 guide catheter theLM coronary was selected and cannulated. Using Heparin for anticoagulation, I utilized a Couger XTguidewire and across the LAD coronary artery with balloon support. I placed the tip of the wire into the distal coronary artery. Angiography was performed.   Then I utilized a 2.5 x 15 mm Sapphire balloon , I performed balloon angioplasty at 3 atmospheric pressure x 2 for 45-50 seconds each. As there was no improvement in flow, and the distal guidewire luminal  position could not be definitively confirmed, as patient had subacute presentation and Q waves in the anterior leads it was felt that medical therapy would be optimal. LV gram did not reveal wall motion abnormality and appeared to be hyperdynamic. Patient also has severe aortic stenosis with a mean gradient of 53 mmHg and will eventually need to be evaluated for aortic valve replacement. He will also be treated for possible pneumonia with pleuritic chest pain as well as fever of 101 F.   Estimated blood loss <50 mL.  During this procedure the patient was administered the following to achieve and maintain moderate conscious sedation: Versed 2 mg, Dilaudid 0.5 mg, while the patient's heart rate, blood pressure, and oxygen saturation were continuously monitored. The period of conscious sedation was 39 minutes, of which I was present face-to-face 100% of this time.  Coronary Findings   Diagnostic  Dominance: Left  Left Anterior Descending  Mid LAD lesion 100% stenosed  Mid LAD lesion is 100% stenosed. Vessel is the culprit lesion. The lesion is type C.  Mid LAD to Dist LAD lesion 90% stenosed  Mid LAD to Dist LAD lesion is 90% stenosed.  Second Diagonal [employer] 2nd Diag lesion 80% stenosed  Ost 2nd Diag lesion is 80% stenosed.  2nd Diag lesion 80% stenosed  2nd Diag lesion is 80% stenosed. The lesion is calcified.  Left Circumflex  The vessel exhibits minimal luminal irregularities.  First Obtuse Marginal Branch  The vessel exhibits minimal luminal irregularities.  Right Coronary Artery  There is mild diffuse disease throughout the vessel.  Intervention   Mid LAD lesion  Angioplasty  Lesion length: 20 mm. CATH VISTA GUIDE 6FR XBLAD3.5 guide catheter was inserted. WIRE COUGAR XT STRL 190CM guidewire used to cross lesion. Balloon angioplasty was performed using a BALLOON SAPPHIRE 2.0X15. Maximum pressure: 3 atm. Inflation time:  50 sec. Lesion appeared to be subacute and heavy thrombus and  distal LAD appeared to be diffusely diseased. Not sure if the wire in true lumen distally. Also Q in anterior leads already and chest pain improving.  Post-Intervention Lesion Assessment  The intervention was unsuccessful due to lesion rigidity. Pre-interventional TIMI flow is 1. Post-intervention TIMI flow is 1. No complications occurred at this lesion.  There is a 100% residual stenosis post intervention.  Left Heart   Mitral Valve The annulus is calcified.  Aorta Aortic Root: There is a significant gradient across the aortic valve.  Coronary Diagrams   Diagnostic Diagram       Post-Intervention Diagram       Implants    No implant documentation for this case.  MERGE Images   Show images for CARDIAC CATHETERIZATION   Link to Procedure Log   Procedure Log    Hemo Data    Most Recent Value  Aortic Mean Gradient 53.63 mmHg  Aortic Peak Gradient 69 mmHg  AO Systolic Pressure 97 mmHg  AO Diastolic Pressure 54 mmHg  AO Mean 72 mmHg  LV Systolic Pressure 532 mmHg  LV Diastolic Pressure 19 mmHg  LV EDP 31 mmHg  AOp Systolic Pressure 992 mmHg  AOp Diastolic Pressure 61 mmHg  AOp Mean Pressure 82 mmHg  LVp Systolic Pressure 426 mmHg  LVp Diastolic Pressure 14 mmHg  LVp EDP Pressure 32 mmHg     Cardiac TAVR CT  TECHNIQUE: The patient was scanned on a [employer]. A 120 kV retrospective scan was triggered in the descending thoracic aorta at 111 HU's. Gantry rotation speed was 250 msecs and collimation was .6 mm. No beta blockade or nitro were given. The 3D data set was reconstructed in 5% intervals of the R-R cycle. Systolic and diastolic phases were analyzed on a dedicated work station using MPR, MIP and VRT modes. The patient received 80 cc of contrast.  FINDINGS: Aortic Valve: Trileaflet aortic valve with severely thickened and calcified leaflets and severe asymmetric calcification extending into the LVOT from under left coronary cusp and continues  into mitral annular calcifications.  Aorta: Normal size, minimal calcifications and atheroma, no dissection.  Sinotubular Junction: 26 x 26 mm  Ascending Thoracic Aorta: 30 x 28 mm  Aortic Arch: 25 x 22 mm  Descending Thoracic Aorta: 23 x 22 mm  Sinus of Valsalva Measurements:  Non-coronary: 31 mm  Right -coronary: 31 mm  Left -coronary: 30 mm  Coronary Artery Height above Annulus:  Left Main: 14 mm  Right Coronary: 17 mm  Virtual Basal Annulus Measurements:  Maximum/Minimum Diameter: 27.3 x 21.5 mm  Mean Diameter: 24.2 mm  Perimeter: 79.0 mm  Area: 460 mm2  Optimum Fluoroscopic Angle for Delivery: LAO 0 CAU 0.  IMPRESSION: 1. Trileaflet aortic valve with severely thickened and calcified leaflets and severe asymmetric calcification extending into the LVOT from under left coronary cusp and continues into mitral annular calcifications. Annular measurements suitable for delivery of a 26 mm Edwards-SAPIEN 3 valve.  2. Sufficient coronary to annulus distance.  3. Optimum Fluoroscopic Angle for Delivery:  LAO 0 CAU 0.  4. No thrombus in the left atrial appendage.  Electronically Signed: By: Ena Dawley On: 03/17/2018 15:05   CT ANGIOGRAPHY CHEST, ABDOMEN AND PELVIS  TECHNIQUE: Multidetector CT imaging through the chest, abdomen and pelvis was performed using the standard protocol during bolus administration of intravenous contrast. Multiplanar reconstructed images and MIPs were obtained and reviewed to evaluate the vascular  anatomy.  CONTRAST:  182m ISOVUE-370 IOPAMIDOL (ISOVUE-370) INJECTION 76%  COMPARISON:  None.  FINDINGS: CTA CHEST FINDINGS  Cardiovascular: Heart size is normal. Moderate to large volume of pericardial fluid with pericardial hyperenhancement. There is aortic atherosclerosis, as well as atherosclerosis of the great vessels of the mediastinum and the coronary arteries, including  calcified atherosclerotic plaque in the left main, left anterior descending, left circumflex and right coronary arteries. Myocardial thinning and hypoenhancement in the apex of the left ventricle where there is focal aneurysmal dilatation of the myocardium, indicative of prior LAD territory myocardial infarction. Severe thickening calcification of the aortic valve. Severe calcifications of the mitral annulus.  Mediastinum/Lymph Nodes: No pathologically enlarged mediastinal or hilar lymph nodes. Esophagus is unremarkable in appearance. No axillary lymphadenopathy.  Lungs/Pleura: Moderate bilateral pleural effusions lying dependently. This is associated with areas of passive atelectasis in the dependent portions of the lower lobes of the lungs bilaterally. No consolidative airspace disease. No suspicious appearing pulmonary nodules or masses are noted.  Musculoskeletal/Soft Tissues: There are no aggressive appearing lytic or blastic lesions noted in the visualized portions of the skeleton.  CTA ABDOMEN AND PELVIS FINDINGS  Hepatobiliary: No suspicious cystic or solid hepatic lesions. No intra or extrahepatic biliary ductal dilatation. Gallbladder is normal in appearance.  Pancreas: No pancreatic mass. No pancreatic ductal dilatation. No pancreatic or peripancreatic fluid or inflammatory changes.  Spleen: Unremarkable.  Adrenals/Urinary Tract: Bilateral kidneys and adrenal glands are normal in appearance. No hydroureteronephrosis. Urinary bladder is normal in appearance.  Stomach/Bowel: Normal appearance of the stomach. No pathologic dilatation of small bowel or colon. A few scattered colonic diverticulae are noted, particularly in the sigmoid colon, without surrounding inflammatory changes to suggest an acute diverticulitis at this time.  Vascular/Lymphatic: Vascular findings and measurements pertinent to potential TAVR procedure, as detailed below. Aortic  atherosclerosis, without evidence of aneurysm or dissection in the abdominal or pelvic vasculature. No lymphadenopathy noted in the abdomen or pelvis.  Reproductive: Brachytherapy implants throughout the prostate gland. Seminal vesicles are unremarkable in appearance.  Other: Trace amount of presacral edema, likely related to prior radiation therapy. No significant volume of ascites. No pneumoperitoneum.  Musculoskeletal: Status post right hip arthroplasty. There are no aggressive appearing lytic or blastic lesions noted in the visualized portions of the skeleton.  VASCULAR MEASUREMENTS PERTINENT TO TAVR:  AORTA:  Minimal Aortic Diameter -  15 x 15 mm  Severity of Aortic Calcification-mild  RIGHT PELVIS:  Right Common Iliac Artery -  Minimal Diameter-9.6 x 10.0 mm  Tortuosity-mild  Calcification-none  Right External Iliac Artery -  Minimal Diameter-8.0 x 7.9 mm  Tortuosity-moderate  Calcification - none  Right Common Femoral Artery -  Minimal Diameter-8.8 x 8.8 mm  Tortuosity-mild  Calcification - none  LEFT PELVIS:  Left Common Iliac Artery -  Minimal Diameter-10.4 x 10.0 mm  Tortuosity-moderate  Calcification - none  Left External Iliac Artery -  Minimal Diameter-8.8 x 8.2 mm  Tortuosity-moderate  Calcification - none  Left Common Femoral Artery -  Minimal Diameter-8.7 x 8.3 mm  Tortuosity-mild  Calcification - none  Review of the MIP images confirms the above findings.  IMPRESSION: 1. Vascular findings and measurements pertinent to potential TAVR procedure, as detailed above. 2. Severe thickening calcifications of the aortic valve, compatible with the reported clinical history of severe aortic stenosis. 3. Aortic atherosclerosis, in addition to left main and 3 vessel coronary artery disease. Evidence of recent left anterior descending coronary artery myocardial infarction, including  aneurysmal dilatation of  the apex of the left ventricle. Importantly, there is also a moderate to large volume of pericardial fluid and pericardial enhancement, which could indicate post infarct pericarditis. 4. Moderate bilateral pleural effusions lying dependently with extensive passive atelectasis in the dependent portions of the lower lobes of the lungs bilaterally. 5. Severe calcifications of the mitral annulus also noted. 6. Mild colonic diverticulosis without evidence to suggest an acute diverticulitis at this time. 7. Additional incidental findings, as above.   Electronically Signed   By: Vinnie Langton M.D.   On: 03/19/2018 09:54    STS Risk Calculator  Procedure: AVR + CAB CALCULATE   Risk of Mortality:  6.578% Renal Failure:  9.410% Permanent Stroke:  3.372% Prolonged Ventilation:  23.021% DSW Infection:  0.530% Reoperation:  6.602% Morbidity or Mortality:  27.653% Short Length of Stay:  8.492% Long Length of Stay:  28.550%    Impression:  Patient has stage D severe symptomatic aortic stenosis and multivessel coronary artery disease.  He presented acutely 1 month ago with an acute anterior wall myocardial infarction.  He was found to have multivessel coronary artery disease and underwent attempted PCI and stenting of the culprit left anterior descending coronary artery.  However, due to delayed presentation revascularization was not feasible.  The patient subsequent convalescence has been slow and complicated by the development of new onset atrial fibrillation and a moderate sized pericardial effusion likely related to pericarditis and hemopericardium in the setting of recent transmural myocardial infarction and systemic anticoagulation.  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  Over the past 2 weeks the patient has been slowly improving, but he still  gets short of breath with low-level activity consistent with New York Heart Association functional class III congestive heart failure.  I have personally reviewed the patient's several recent transthoracic echocardiograms, diagnostic cardiac catheterization, and CT angiograms.  Echocardiograms reveal moderate global left ventricular systolic dysfunction with ejection fraction estimated 40 to 45%.  There is severe akinesis of the distal anterior wall and apex consistent with transmural myocardial infarction in the left anterior descending coronary artery territory.  The patient also has a moderate sized pericardial effusion which has decreased in size over the past 2 weeks but still persists at this time.  The patient has severe aortic stenosis.  There is severe calcification, fibrosis, and restricted leaflet mobility involving all 3 leaflets of the aortic valve.  On recent echocardiogram peak velocity across the aortic valve measured 4.6 m/s corresponding to mean transvalvular gradient estimated 49 mmHg.  Diagnostic cardiac catheterization reveals severe multivessel coronary artery disease.  There is subtotal occlusion of the distal left anterior descending coronary artery and attempts at PCI were unsuccessful.  There is fairly high-grade 80% proximal stenosis of a diagonal branch.  There is left dominant coronary circulation.  There does not appear to be significant disease in the left circumflex system although there may be some proximal disease in the small sized posterior descending coronary artery which is a terminal branch of the left circumflex.  The right coronary is small and nondominant.  I agree the patient would benefit from aortic valve replacement.  Risks associated with conventional surgery would be relatively high and probably considerably higher than that predicted using the STS risk calculator because of the patient's recent myocardial infarction, likely pericarditis and pericardial effusion,  and numerous comorbid medical problems including physical deconditioning with unstable gait and mild cognitive dysfunction with possible  early symptoms of Alzheimer's type dementia.  CT angiography reveals adequate femoral vascular access for transcatheter aortic valve replacement via percutaneous transfemoral approach.  Cardiac gated CT angiogram of the heart confirms the presence of anatomical findings consistent with severe aortic stenosis.  The patient has significant calcification in the aortic annulus including a fairly bulky area of calcification that extends into the left ventricular outflow tract which would considerably increase risk of development of perivalvular leak and potentially could be associated with increased risk of annular rupture and transcatheter aortic valve replacement   Plan:  The patient and his wife counseled at length regarding treatment alternatives for management of severe symptomatic aortic stenosis. Alternative approaches such as conventional aortic valve replacement, transcatheter aortic valve replacement, and continued medical therapy without intervention were compared and contrasted at length.  The risks associated with conventional surgical aortic valve replacement were discussed in detail, as were expectations for post-operative convalescence, and why I would be reluctant to consider this patient a candidate for conventional surgery.  The potential advantages of performing concomitant coronary artery bypass grafting with or without clipping of left atrial appendage and/or Maze procedure were discussed, as well as the benefits from draining what's left of his pericardial effusion.  Issues specific to transcatheter aortic valve replacement were discussed including questions about long term valve durability, the potential for paravalvular leak, possible increased risk of need for permanent pacemaker placement, and other technical complications related to the procedure  itself.  Concerns regarding the presence of significant calcification in the aortic root were discussed.  Long-term prognosis with medical therapy was discussed. This discussion was placed in the context of the patient's own specific clinical presentation and past medical history.  All of their questions have been addressed.    The patient feels that he is not ready to have anything done at this time and that he needs some additional time to recover from his recent heart attack.  I completely agree with his impression.  Based upon review of the patient's cardiac gated CT angiogram I am concerned about risks associated with transcatheter aortic valve replacement.  On the other hand, I have serious questions as to whether or not this patient would do well with open surgery.  The patient will return to our office in approximately 4 weeks to see how he is coming along.  During the interim period of time the patient's diagnostic test will be reviewed by a multidisciplinary team of specialists to discuss treatment options further.    I spent in excess of 90 minutes during the conduct of this office consultation and >50% of this time involved direct face-to-face encounter with the patient for counseling and/or coordination of their care.    Valentina Gu. Roxy Manns, MD 04/03/2018 12:11 PM

## 2018-04-03 NOTE — Patient Instructions (Signed)
Continue all previous medications without any changes at this time  

## 2018-04-04 ENCOUNTER — Encounter: Payer: Medicare Other | Admitting: Thoracic Surgery (Cardiothoracic Vascular Surgery)

## 2018-04-05 ENCOUNTER — Encounter: Payer: Self-pay | Admitting: Cardiology

## 2018-04-16 ENCOUNTER — Other Ambulatory Visit: Payer: Self-pay | Admitting: Cardiology

## 2018-04-16 ENCOUNTER — Other Ambulatory Visit: Payer: Self-pay | Admitting: Oncology

## 2018-04-16 ENCOUNTER — Encounter: Payer: Medicare Other | Admitting: Thoracic Surgery (Cardiothoracic Vascular Surgery)

## 2018-04-16 DIAGNOSIS — I350 Nonrheumatic aortic (valve) stenosis: Secondary | ICD-10-CM

## 2018-04-16 DIAGNOSIS — D509 Iron deficiency anemia, unspecified: Secondary | ICD-10-CM

## 2018-04-16 NOTE — Progress Notes (Signed)
[name] is a Caucasian male with invasive prostate cancer diagnosed in Nov 2018 and is S/P chemo and RT and in remission.  He has hypertension, hyperlipidemia, moderate aortic stenosis, obstructive sleep apnea on CPAP, controlled diabetes mellitus, mild obesity, mostly truncal obesity, chronic leg edema was admitted to [facility] on 03/01/2014 and discharged on 03/05/2018 when he presented with late with anterolateral wall MI, coronary angiography revealed organized thrombus in the mid LAD and aggressive attempts at angioplasty was not performed as it was a late presentation.    Patient is here on a ten-day office visit and follow-up of acute decompensated heart failure, he had done well, no further clinical evidence of acute decompensated heart failure, no JVD, lungs are completely clear.  He has not been following very strict diet with salt restriction and fluid restriction.  He reviewed his labs, still continues to be severely anemic.  He may benefit from iron transfusion prior to upcoming surgery.  He was evaluated by surgery and surgery has been postponed, he is not a candidate for TAVR, he will need traditional open heart surgery along with CABG. On the day he was evaluated, patient was feeling very poorly and he was felt not to be in the best shape for proceeding with AVR.  I will see him back in 10 days, for close monitoring.

## 2018-04-30 ENCOUNTER — Other Ambulatory Visit (INDEPENDENT_AMBULATORY_CARE_PROVIDER_SITE_OTHER): Payer: Medicare Other

## 2018-04-30 DIAGNOSIS — I350 Nonrheumatic aortic (valve) stenosis: Secondary | ICD-10-CM

## 2018-04-30 DIAGNOSIS — D509 Iron deficiency anemia, unspecified: Secondary | ICD-10-CM

## 2018-04-30 LAB — BASIC METABOLIC PANEL
Anion gap: 12 (ref 5–15)
BUN: 23 mg/dL (ref 8–23)
CO2: 25 mmol/L (ref 22–32)
Calcium: 8.9 mg/dL (ref 8.9–10.3)
Chloride: 100 mmol/L (ref 98–111)
Creatinine, Ser: 1.23 mg/dL (ref 0.61–1.24)
GFR calc Af Amer: >60 mL/min (ref >60)
GFR calc non Af Amer: 56 mL/min — ABNORMAL LOW (ref >60)
Glucose, Bld: 179 mg/dL — ABNORMAL HIGH (ref 70–99)
Potassium: 3.2 mmol/L — ABNORMAL LOW (ref 3.5–5.1)
Sodium: 137 mmol/L (ref 135–145)

## 2018-04-30 LAB — CBC WITH DIFFERENTIAL/PLATELET
Abs Immature Granulocytes: 0 10*3/uL (ref 0.0–0.1)
Basophils Absolute: 0 10*3/uL (ref 0.0–0.1)
Basophils Relative: 1 %
Eosinophils Absolute: 0.1 10*3/uL (ref 0.0–0.7)
Eosinophils Relative: 3 %
HCT: 28.8 % — ABNORMAL LOW (ref 39.0–52.0)
Hemoglobin: 9.2 g/dL — ABNORMAL LOW (ref 13.0–17.0)
Immature Granulocytes: 0 %
Lymphocytes Relative: 14 %
Lymphs Abs: 0.6 10*3/uL — ABNORMAL LOW (ref 0.7–4.0)
MCH: 27.9 pg (ref 26.0–34.0)
MCHC: 31.9 g/dL (ref 30.0–36.0)
MCV: 87.3 fL (ref 78.0–100.0)
Monocytes Absolute: 0.3 10*3/uL (ref 0.1–1.0)
Monocytes Relative: 7 %
Neutro Abs: 3.3 10*3/uL (ref 1.7–7.7)
Neutrophils Relative %: 75 %
Platelets: 229 10*3/uL (ref 150–400)
RBC: 3.3 MIL/uL — ABNORMAL LOW (ref 4.22–5.81)
RDW: 16.7 % — ABNORMAL HIGH (ref 11.5–15.5)
WBC: 4.5 10*3/uL (ref 4.0–10.5)

## 2018-04-30 LAB — RETICULOCYTES
RBC.: 3.3 MIL/uL — ABNORMAL LOW (ref 4.22–5.81)
Retic Count, Absolute: 79.2 10*3/uL (ref 19.0–186.0)
Retic Ct Pct: 2.4 % (ref 0.4–3.1)

## 2018-04-30 LAB — LACTATE DEHYDROGENASE: LDH: 147 U/L (ref 98–192)

## 2018-04-30 LAB — IRON AND TIBC
Iron: 53 ug/dL (ref 45–182)
Saturation Ratios: 18 % (ref 17.9–39.5)
TIBC: 302 ug/dL (ref 250–450)
UIBC: 249 ug/dL

## 2018-04-30 LAB — FERRITIN: Ferritin: 391 ng/mL — ABNORMAL HIGH (ref 24–336)

## 2018-04-30 NOTE — Addendum Note (Signed)
Addended by: Truddie Crumble on: 04/30/2018 02:57 PM   Modules accepted: Orders

## 2018-05-01 LAB — VON WILLEBRAND PANEL
Factor VIII Activity: 105 % (ref 56–140)
Von Willebrand Ag: 117 % (ref 50–200)
Von Willebrand Factor: 73 % (ref 50–200)

## 2018-05-01 LAB — COAG STUDIES INTERP REPORT

## 2018-05-02 ENCOUNTER — Telehealth: Payer: Self-pay | Admitting: *Deleted

## 2018-05-02 NOTE — Telephone Encounter (Signed)
Pt called / informed "red blood count has improved up to 9.2 and iron levels good. Special blood tests show no evidence of a problem with clotting.  No need to get extra iron at this time." per Dr Beryle Beams. Stated good news and thanks for calling.

## 2018-05-02 NOTE — Telephone Encounter (Signed)
-----   Message from Annia Belt, MD sent at 05/01/2018  1:07 PM EDT ----- Call pt: red blood count has improved up to 9.2 and iron levels good. Special blood tests show no evidence of a problem with clotting.  No need to get extra iron at this time.

## 2018-05-07 ENCOUNTER — Encounter: Payer: Self-pay | Admitting: Thoracic Surgery (Cardiothoracic Vascular Surgery)

## 2018-05-07 ENCOUNTER — Ambulatory Visit (INDEPENDENT_AMBULATORY_CARE_PROVIDER_SITE_OTHER): Payer: Medicare Other | Admitting: Thoracic Surgery (Cardiothoracic Vascular Surgery)

## 2018-05-07 ENCOUNTER — Other Ambulatory Visit: Payer: Self-pay

## 2018-05-07 VITALS — BP 126/68 | HR 75 | Resp 18 | Ht 69.0 in | Wt 198.4 lb

## 2018-05-07 DIAGNOSIS — I350 Nonrheumatic aortic (valve) stenosis: Secondary | ICD-10-CM | POA: Diagnosis not present

## 2018-05-07 NOTE — Progress Notes (Signed)
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  He was originally seen in consultation on April 03, 2018.  At that time he was still struggling with recovery following his recent acute myocardial infarction.  We discussed treatment options at length at that time including a comparison between conventional surgical aortic valve replacement with or without coronary artery bypass grafting and Maze procedure versus transcatheter aortic valve replacement.  Under the circumstances the patient was felt to be relatively poor candidate for conventional surgery, but cardiac gated CT angiogram of the heart revealed anatomical findings relatively unfavorable for transcatheter aortic valve replacement because of extensive calcification in the aortic root.  At that time a decision was made to hold off on making any plans for surgical intervention until the patient had additional time to recover from his previous myocardial infarction.  During the interim the patient has also been seen in consultation by Dr. Beryle Beams because of his history of chronic anemia.  The likely need for blood transfusion following surgical  intervention was anticipated.  The patient returns the office today with his wife.  He reports that he is doing much better, and he feels that he is now getting close to his baseline prior to his myocardial infarction.  He still gets short of breath with exertion, but this only limits him slightly.  His energy level is much better.  He is eating quite well.  His strength continues to improve.  He has not had any exertional chest pain or chest tightness.  He denies any resting shortness of breath, PND, orthopnea, or lower extremity edema.  He has not had any palpitations, dizzy spells, nor syncope.   Current Outpatient Medications  Medication Sig Dispense Refill  . acetaminophen (TYLENOL) 325 MG tablet Take 1 tablet (325 mg total) by mouth every 4 (four) hours as needed (headache, pain). (Patient taking differently: Take 325 mg by mouth every 4 (four) hours as needed (headaches or pain). ) 60 tablet 3  . amiodarone (PACERONE) 200 MG tablet Take 1 tablet (200 mg total) by mouth 2 (two) times daily. 30 tablet 0  . apixaban (ELIQUIS) 5 MG TABS tablet Take 1 tablet (5 mg total) by mouth 2 (two) times daily. 60 tablet 3  . aspirin EC 81 MG tablet Take 81 mg by mouth daily.     Marland Kitchen atorvastatin (LIPITOR) 80 MG tablet Take 1 tablet (80 mg total) by mouth daily at 6 PM. (Patient taking differently: Take 80 mg by mouth every evening. ) 60 tablet 3  . colesevelam (WELCHOL) 625 MG tablet Take 1,875 mg by mouth daily.     Marland Kitchen donepezil (ARICEPT) 10 MG tablet Take 10 mg by mouth every morning.     Marland Kitchen  esomeprazole (NEXIUM) 40 MG capsule Take 40 mg by mouth daily as needed (acid reflux).     . ferrous sulfate 325 (65 FE) MG tablet Take 1 tablet (325 mg total) by mouth 3 (three) times daily with meals. 60 tablet 3  . fluticasone (FLONASE) 50 MCG/ACT nasal spray Place 1 spray into both nostrils daily as needed for allergies or rhinitis.    Marland Kitchen guaiFENesin (ROBITUSSIN) 100 MG/5ML SOLN Take 5 mLs (100 mg total) by mouth every  6 (six) hours as needed for cough or to loosen phlegm. 1200 mL 2  . insulin glargine (LANTUS SOLOSTAR) 100 UNIT/ML injection Inject 20-40 Units into the skin at bedtime as needed (for BGL 150 or greater).     . memantine (NAMENDA) 5 MG tablet Take 5 mg by mouth 2 (two) times daily.    . metoprolol tartrate (LOPRESSOR) 25 MG tablet Take 0.5 tablets (12.5 mg total) by mouth 2 (two) times daily. 60 tablet 3  . nabumetone (RELAFEN) 750 MG tablet Take 750 mg by mouth 2 (two) times daily as needed for mild pain.    . nitroGLYCERIN (NITROSTAT) 0.4 MG SL tablet Place 1 tablet (0.4 mg total) under the tongue every 5 (five) minutes as needed for chest pain. 30 tablet 3  . omega-3 acid ethyl esters (LOVAZA) 1 G capsule Take 2 g by mouth daily.     Marland Kitchen rOPINIRole (REQUIP) 2 MG tablet Take 1-2 mg by mouth See admin instructions. Take 2 mg by mouth at bedtime and also 1-2 mg once during the day as needed for restless legs    . spironolactone (ALDACTONE) 25 MG tablet Take 0.5 tablets (12.5 mg total) by mouth daily. 30 tablet 3  . SYMBICORT 160-4.5 MCG/ACT inhaler Inhale 2 puffs into the lungs 2 (two) times daily.    . tamsulosin (FLOMAX) 0.4 MG CAPS capsule Take 0.4 mg by mouth daily after supper.     . torsemide (DEMADEX) 20 MG tablet Take 1 tablet (20 mg total) by mouth 2 (two) times daily. 60 tablet 3   No current facility-administered medications for this visit.       Physical Exam:   BP 126/68 (BP Location: Right Arm, Patient Position: Sitting, Cuff Size: Normal)   Pulse 75   Resp 18   Ht 5\' 9"  (1.753 m)   Wt 198 lb 6.4 oz (90 kg)   SpO2 97% Comment: RA  BMI 29.30 kg/m   General:  Well-appearing  Chest:   Clear to auscultation with symmetrical breath sounds  CV:   Regular rate and rhythm with systolic murmur heard best along the sternal border  Incisions:  n/a  Abdomen:  Soft nontender  Extremities:  Warm and well-perfused, no edema  Diagnostic Tests:  n/a   Impression:  Patient has  stage D severe symptomatic aortic stenosis and multivessel coronary artery disease.  He presented acutely 1 month ago with an acute anterior wall myocardial infarction.  He was found to have multivessel coronary artery disease and underwent attempted PCI and stenting of the culprit left anterior descending coronary artery.  However, due to delayed presentation revascularization was not feasible.  The patient subsequent convalescence has been slow and complicated by the development of new onset atrial fibrillation and a moderate sized pericardial effusion likely related to pericarditis and hemopericardium in the setting of recent transmural myocardial infarction and systemic anticoagulation.  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  Patient appears to have made excellent progress over the past month with improved strength and decreased symptoms of exertional shortness of breath.  He now gets short of breath only with moderate level activity, consistent with New York Heart Association functional class II congestive heart failure.  Overall he appears much improved.  I have personally reviewed the patient's several recent transthoracic echocardiograms, diagnostic cardiac catheterization, and CT angiograms.  Echocardiograms reveal moderate global left ventricular systolic dysfunction with ejection fraction estimated 40 to 45%.  There is severe akinesis of the distal anterior wall and apex consistent with transmural myocardial infarction in the left anterior descending coronary artery territory.  The patient also has a moderate sized pericardial effusion which has decreased in size over the past 2 weeks but still persists at this time.  The patient has severe aortic stenosis.  There is severe calcification, fibrosis, and restricted leaflet mobility involving all 3 leaflets of the aortic valve.  On recent  echocardiogram peak velocity across the aortic valve measured 4.6 m/s corresponding to mean transvalvular gradient estimated 49 mmHg.  Diagnostic cardiac catheterization reveals severe multivessel coronary artery disease.  There is subtotal occlusion of the distal left anterior descending coronary artery and attempts at PCI were unsuccessful.  There is fairly high-grade 80% proximal stenosis of a diagonal branch.  There is left dominant coronary circulation.  There does not appear to be significant disease in the left circumflex system although there may be some proximal disease in the small sized posterior descending coronary artery which is a terminal branch of the left circumflex.  The right coronary is small and nondominant.  I agree the patient would benefit from aortic valve replacement.  Risks associated with conventional surgery would be relatively high and possibly higher than that predicted using the STS risk calculator because of the patient's recent myocardial infarction, likely pericarditis and pericardial effusion, and numerous comorbid medical problems including physical deconditioning with unstable gait and mild cognitive dysfunction with possible early symptoms of Alzheimer's type dementia.  However, he seems to have made considerable progress over the past month.  CT angiography reveals adequate femoral vascular access for transcatheter aortic valve replacement via percutaneous transfemoral approach.  Cardiac gated CT angiogram of the heart confirms the presence of anatomical findings consistent with severe aortic stenosis.  The patient has significant calcification in the aortic annulus including a fairly bulky area of calcification that extends into the left ventricular outflow tract which would considerably increase risk of development of perivalvular leak and potentially could be associated with increased risk of annular rupture and transcatheter aortic valve  replacement   Plan:  The patient and his wife were again counseled at length regarding treatment alternatives for management of severe symptomatic aortic stenosis. Alternative approaches such as conventional aortic valve replacement, transcatheter aortic valve replacement, and continued medical therapy without intervention were compared and contrasted at length.  The risks associated with conventional surgical aortic valve replacement were discussed in detail, as were expectations for post-operative convalescence.  The potential advantages of performing concomitant coronary artery bypass grafting with or without clipping of left atrial appendage and/or Maze procedure were discussed, as well as the benefits from draining what's left of his pericardial effusion.  Issues specific to transcatheter aortic valve replacement were discussed including questions about long term valve durability, the potential for paravalvular leak, possible increased risk of need for permanent pacemaker placement, risk of stroke, and other technical complications related to the procedure itself.  Concerns regarding the presence of significant  calcification in the aortic root were discussed.  Long-term prognosis with medical therapy was discussed. This discussion was placed in the context of the patient's own specific clinical presentation and past medical history.  All of their questions have been addressed.   The patient desires to proceed with conventional surgical aortic valve replacement with possible coronary artery bypass grafting and Maze procedure at some point in the near future.   Discussion was held comparing the relative risks of mechanical valve replacement with need for lifelong anticoagulation versus use of a bioprosthetic tissue valve and the associated potential for late structural valve deterioration and failure.  This discussion was placed in the context of the patient's particular circumstances, and as a result  the patient specifically requests that their valve be replaced using a bioprosthetic tissue valve.  Both the patient and his wife understand and accept all potential risks of surgery including but not limited to risk of death, stroke or other neurologic complication, myocardial infarction, congestive heart failure, respiratory failure, renal failure, bleeding requiring transfusion and/or reexploration, arrhythmia, infection or other wound complications, pneumonia, pleural and/or pericardial effusion, pulmonary embolus, aortic dissection or other major vascular complication, or delayed complications related to valve repair or replacement including but not limited to structural valve deterioration and failure, thrombosis, embolization, endocarditis, or paravalvular leak, late recurrence of ischemic heart disease.  The relative risks and benefits of performing a maze procedure at the time of their surgery was discussed at length, including the expected likelihood of long term freedom from recurrent symptomatic atrial fibrillation and/or atrial flutter.  The patient additionally provides consent for long term follow up following surgery including participation in the [place].  They understand the high likelihood that the patient will need blood transfusion during or following his surgery because of his underlying baseline anemia.  They understand the possibility that coronary artery bypass grafting may not be feasible due to the size and/or disease involving the distal target vessels as well as the presence of scar tissue related to recent pericarditis.  They also understand and accept concerns regarding the patient's degree of physical deconditioning and the potential that his physical recovery might be somewhat slow and prolonged.  All of their questions have been answered.  We tentatively plan to proceed with surgery on May 30, 2018.  The patient will stop taking Eliquis 7 days prior to surgery.  He will  return to our office for follow-up on May 28, 2018.    I spent in excess of 15 minutes during the conduct of this office consultation and >50% of this time involved direct face-to-face encounter with the patient for counseling and/or coordination of their care.    Valentina Gu. Roxy Manns, MD 05/07/2018 2:46 PM

## 2018-05-07 NOTE — Patient Instructions (Signed)
Stop taking Eliquis 7 days prior to surgery (October 23)  Continue taking all other medications without change through the day before surgery.  Have nothing to eat or drink after midnight the night before surgery.  On the morning of surgery take only Nexium and metoprolol with a sip of water.

## 2018-05-08 ENCOUNTER — Other Ambulatory Visit: Payer: Self-pay | Admitting: *Deleted

## 2018-05-08 ENCOUNTER — Encounter: Payer: Self-pay | Admitting: *Deleted

## 2018-05-08 DIAGNOSIS — I2510 Atherosclerotic heart disease of native coronary artery without angina pectoris: Secondary | ICD-10-CM

## 2018-05-08 DIAGNOSIS — I350 Nonrheumatic aortic (valve) stenosis: Secondary | ICD-10-CM

## 2018-05-08 DIAGNOSIS — I480 Paroxysmal atrial fibrillation: Secondary | ICD-10-CM

## 2018-05-09 ENCOUNTER — Encounter: Payer: Self-pay | Admitting: Thoracic Surgery (Cardiothoracic Vascular Surgery)

## 2018-05-21 ENCOUNTER — Other Ambulatory Visit: Payer: Self-pay

## 2018-05-21 ENCOUNTER — Other Ambulatory Visit: Payer: Self-pay | Admitting: Oncology

## 2018-05-21 ENCOUNTER — Ambulatory Visit (INDEPENDENT_AMBULATORY_CARE_PROVIDER_SITE_OTHER): Payer: Medicare Other | Admitting: Oncology

## 2018-05-21 ENCOUNTER — Encounter: Payer: Self-pay | Admitting: Oncology

## 2018-05-21 VITALS — BP 114/54 | HR 72 | Temp 98.1°F | Ht 68.0 in | Wt 198.2 lb

## 2018-05-21 DIAGNOSIS — Z8546 Personal history of malignant neoplasm of prostate: Secondary | ICD-10-CM

## 2018-05-21 DIAGNOSIS — C61 Malignant neoplasm of prostate: Secondary | ICD-10-CM

## 2018-05-21 DIAGNOSIS — D649 Anemia, unspecified: Secondary | ICD-10-CM

## 2018-05-21 DIAGNOSIS — Z8249 Family history of ischemic heart disease and other diseases of the circulatory system: Secondary | ICD-10-CM

## 2018-05-21 DIAGNOSIS — Z9889 Other specified postprocedural states: Secondary | ICD-10-CM

## 2018-05-21 DIAGNOSIS — R011 Cardiac murmur, unspecified: Secondary | ICD-10-CM

## 2018-05-21 DIAGNOSIS — Z87891 Personal history of nicotine dependence: Secondary | ICD-10-CM

## 2018-05-21 DIAGNOSIS — I10 Essential (primary) hypertension: Secondary | ICD-10-CM

## 2018-05-21 DIAGNOSIS — Z79899 Other long term (current) drug therapy: Secondary | ICD-10-CM

## 2018-05-21 DIAGNOSIS — I252 Old myocardial infarction: Secondary | ICD-10-CM | POA: Diagnosis not present

## 2018-05-21 DIAGNOSIS — Z7982 Long term (current) use of aspirin: Secondary | ICD-10-CM

## 2018-05-21 DIAGNOSIS — E785 Hyperlipidemia, unspecified: Secondary | ICD-10-CM

## 2018-05-21 DIAGNOSIS — I480 Paroxysmal atrial fibrillation: Secondary | ICD-10-CM

## 2018-05-21 DIAGNOSIS — Z923 Personal history of irradiation: Secondary | ICD-10-CM

## 2018-05-21 DIAGNOSIS — E119 Type 2 diabetes mellitus without complications: Secondary | ICD-10-CM

## 2018-05-21 DIAGNOSIS — I350 Nonrheumatic aortic (valve) stenosis: Secondary | ICD-10-CM | POA: Diagnosis not present

## 2018-05-21 DIAGNOSIS — Z7901 Long term (current) use of anticoagulants: Secondary | ICD-10-CM

## 2018-05-21 DIAGNOSIS — Z9989 Dependence on other enabling machines and devices: Secondary | ICD-10-CM

## 2018-05-21 DIAGNOSIS — R591 Generalized enlarged lymph nodes: Secondary | ICD-10-CM

## 2018-05-21 DIAGNOSIS — G4733 Obstructive sleep apnea (adult) (pediatric): Secondary | ICD-10-CM

## 2018-05-21 DIAGNOSIS — Z96641 Presence of right artificial hip joint: Secondary | ICD-10-CM

## 2018-05-21 DIAGNOSIS — Z794 Long term (current) use of insulin: Secondary | ICD-10-CM

## 2018-05-21 LAB — CBC WITH DIFFERENTIAL/PLATELET
Abs Immature Granulocytes: 0.02 10*3/uL (ref 0.00–0.07)
Basophils Absolute: 0.1 10*3/uL (ref 0.0–0.1)
Basophils Relative: 1 %
Eosinophils Absolute: 0.2 10*3/uL (ref 0.0–0.5)
Eosinophils Relative: 5 %
HCT: 32.4 % — ABNORMAL LOW (ref 39.0–52.0)
Hemoglobin: 10.6 g/dL — ABNORMAL LOW (ref 13.0–17.0)
Immature Granulocytes: 0 %
Lymphocytes Relative: 22 %
Lymphs Abs: 1 10*3/uL (ref 0.7–4.0)
MCH: 28.4 pg (ref 26.0–34.0)
MCHC: 32.7 g/dL (ref 30.0–36.0)
MCV: 86.9 fL (ref 80.0–100.0)
Monocytes Absolute: 0.5 10*3/uL (ref 0.1–1.0)
Monocytes Relative: 10 %
Neutro Abs: 3 10*3/uL (ref 1.7–7.7)
Neutrophils Relative %: 62 %
Platelets: 224 10*3/uL (ref 150–400)
RBC: 3.73 MIL/uL — ABNORMAL LOW (ref 4.22–5.81)
RDW: 16.4 % — ABNORMAL HIGH (ref 11.5–15.5)
WBC: 4.8 10*3/uL (ref 4.0–10.5)
nRBC: 0 % (ref 0.0–0.2)

## 2018-05-21 LAB — SAVE SMEAR

## 2018-05-21 LAB — RETICULOCYTES
Immature Retic Fract: 14.1 % (ref 2.3–15.9)
RBC.: 3.73 MIL/uL — ABNORMAL LOW (ref 4.22–5.81)
Retic Count, Absolute: 73.9 10*3/uL (ref 19.0–186.0)
Retic Ct Pct: 2 % (ref 0.4–3.1)

## 2018-05-21 NOTE — Progress Notes (Signed)
New Patient Hematology   Cody Phelps [id number] Feb 01, 1941 77 y.o. 05/21/2018  CC: Dr. Darylene Price; Dr. Adrian Prows; Dr. [name]   Reason for referral:  Persistent anemia with plans for aortic valve replacement   HPI: 77 year old man with hypertension, hyperlipidemia, type 2 diabetes, paroxysmal atrial fibrillation, who was under evaluation for a valve replacement related to severe aortic stenosis when he had an anteroseptal MI on March 01, 2018.  Hemoglobin 10 g on admission with MCV 87.5 but fell to 8.2.  Cardiac cath was done.  Attempt at angioplasty of an LAD lesion had to be aborted in view of the low flow associated with the aortic stenosis.  Of note, patient had radioactive seed implants for prostate cancer in December 2018 and a right hip replacement in March 2019. Baseline hemoglobin from December 2018 was 12 g. Dr. [name] called me during the hospitalization concerned about his anemia and asked if I would order IV iron for the patient.  I reviewed the record.  His ferritin was elevated consistent with an acute phase reaction.  I did not think it was unreasonable to give him a dose of iron in view of the recent surgeries and likely blood loss. Dr. [name] asked me to reevaluate him prior to upcoming planned surgery.  He is now out of the window for an acute phase reaction and ferritin was still elevated at 391.  To exclude other potential etiologies of his anemia, I considered the possibility of an acquired von Willebrand's disorder due to the known increased shear force of the red cells over the stenotic valve however,   PTT, LDH, reticulocyte count, bilirubin, and a von Willebrand's profile were done on September 30 and all returned normal. Another consideration given his age, mild chronic renal insufficiency, and history of bilateral carpal tunnel repair  was multiple myeloma or amyloidosis.  Serum kappa and lambda free light chain analysis done on August 19 was normal making a  plasma cell dyscrasia unlikely. Folic acid was normal.  Methylmalonic acid normal. Urine trace positive for hemoglobin but only 0-5 red cells per high-power field. PSA undetectable at 0.01. I did not feel that additional IV iron would be useful.  Hemoglobin was slowly inching up as he recovered from his heart attack and was 9.2 on September 30. I alerted his surgeon and his cardiologist that I felt the patient was safe to proceed with planned surgery.  I am happy to report hemoglobin has improved and is 10.6 today.    PMH: Past Medical History:  Diagnosis Date  . Carotid stenosis, left followed by dr [name], cardiologist--- bilateral bruit per note   per dr [name] note 17/5102  LICA 58-52% stenosis per duplex 05-14-2014  . Diverticulosis   . ED (erectile dysfunction)   . First degree heart block   . GERD (gastroesophageal reflux disease)   . Hypertension    cardiologsit-  dr [name]  . Iron deficiency anemia   . Mixed hyperlipidemia   . OA (osteoarthritis)    right hip  . OSA on CPAP    followed by dr dohmeier, uses CPAP  . PAF (paroxysmal atrial fibrillation) ([name])    a. on Eliquis  . Prostate cancer ([place])    dx 03-17-2017 (bx)  Stage T2a, Gleason 4+4, PSA  4.43, vol 32.32--  s/p radiatctive prostate seed implants 07-10-2017 then IMRT and ADT  . RLS (restless legs syndrome)   . Scoliosis   . Severe aortic stenosis   . Trigger finger   .  Type 2 diabetes mellitus ([place])   . Wears partial dentures    upper and lower    Past Surgical History:  Procedure Laterality Date  . APPENDECTOMY  1988  . BILATERAL TOTAL ETHMOIDECTOMY AND SPHENOIDECTOMY  01-14-2009    dr bates   [facility]  . CARPAL TUNNEL RELEASE Bilateral 2013  . CATARACT EXTRACTION W/ INTRAOCULAR LENS  IMPLANT, BILATERAL  date?  . CORONARY BALLOON ANGIOPLASTY N/A 03/01/2018   Procedure: CORONARY BALLOON ANGIOPLASTY;  Surgeon: Adrian Prows, MD;  Location: [place] [facility];  Service: Cardiovascular;  Laterality: N/A;  .  CORONARY/GRAFT ACUTE MI REVASCULARIZATION N/A 03/01/2018   Procedure: Coronary/Graft Acute MI Revascularization;  Surgeon: Adrian Prows, MD;  Location: [place] [facility];  Service: Cardiovascular;  Laterality: N/A;  . CYSTOSCOPY  07/19/2017   Procedure: CYSTOSCOPY;  Surgeon: Nickie Retort, MD;  Location: [facility];  Service: Urology;;  No seeds found in [place]  . LAMINECTOMY WITH POSTERIOR LATERAL ARTHRODESIS LEVEL 2 N/A 02/12/2014   Procedure: LUMBAR TWO-THREE,LUIMBAR THREE-FOUR LAMINECTOMY/FORAMINOTOMY;POSSIBLE POSTEROLATERAL ARTHRODESIS WITH AUTOGRAFT;  Surgeon: [name], MD;  Location: [facility];  Service: Neurosurgery;  Laterality: N/A;  . LEFT HEART CATH AND CORONARY ANGIOGRAPHY N/A 03/01/2018   Procedure: LEFT HEART CATH AND CORONARY ANGIOGRAPHY;  Surgeon: Adrian Prows, MD;  Location: [place] [facility];  Service: Cardiovascular;  Laterality: N/A;  . ORIF RIGHT ANKLE FX'S  05/05/2001   retained hardware  . PROSTATE BIOPSY  03-17-2017   dr Pilar Jarvis office  . RADIOACTIVE SEED IMPLANT N/A 07/19/2017   Procedure: RADIOACTIVE SEED IMPLANT/BRACHYTHERAPY IMPLANT;  Surgeon: Nickie Retort, MD;  Location: [facility];  Service: Urology;  Laterality: N/A;  69 seeds implanted  . SPACE OAR INSTILLATION N/A 07/19/2017   Procedure: SPACE OAR INSTILLATION;  Surgeon: Nickie Retort, MD;  Location: [facility];  Service: Urology;  Laterality: N/A;  . TEE WITHOUT CARDIOVERSION  03/20/2012   Procedure: TRANSESOPHAGEAL ECHOCARDIOGRAM (TEE);  Surgeon: Laverda Page, MD;  Location: [facility];  Service: Cardiovascular;  Laterality: N/A;  normal LV; normal EF; normal RV; normal LA w/ left atrial appendage very small, normal function, interatrial septum intact without defect; normal RA; trace MR,TR, & PI; mild AV calcification and senile degeneration w/ mild stenosis, AVA 1.7cm^2;;   . TOTAL HIP ARTHROPLASTY Right 10/23/2017   Procedure: TOTAL HIP  ARTHROPLASTY ANTERIOR APPROACH;  Surgeon: Frederik Pear, MD;  Location: [place];  Service: Orthopedics;  Laterality: Right;  . TRANSTHORACIC ECHOCARDIOGRAM  01-11-2017   dr [name]  (per echo note, no significant change in seveity of AS, no other diagnostic change)   moderate concentric LVH, ef 48%, grade 1 diastolic dysfunction/  moderate LAE/  mild , grade 1 AR w/ moderate AV calcification, mild to moderate restricted AV leaflets w/ moderate AS, AVA 1.16cm^2, peak grandient 59mHg, mean grandient 386mg/  trace MR, mild calcification MV annulus , mild MV leaflet calcification, mild MVS, peak grandient 4.89m42m, mean grandient 2.7mm56m trace TR    Allergies: No Known Allergies  Medications:  Current Outpatient Medications:  .  acetaminophen (TYLENOL) 325 MG tablet, Take 1 tablet (325 mg total) by mouth every 4 (four) hours as needed (headache, pain). (Patient taking differently: Take 325 mg by mouth every 4 (four) hours as needed (headaches or pain). ), Disp: 60 tablet, Rfl: 3 .  amiodarone (PACERONE) 200 MG tablet, Take 1 tablet (200 mg total) by mouth 2 (two) times daily., Disp: 30 tablet, Rfl: 0 .  apixaban (ELIQUIS) 5 MG TABS tablet, Take 1 tablet (5 mg total) by mouth 2 (two) times daily., Disp: 60 tablet, Rfl: 3 .  aspirin EC 81 MG tablet, Take 81 mg by mouth daily. , Disp: , Rfl:  .  atorvastatin (LIPITOR) 80 MG tablet, Take 1 tablet (80 mg total) by mouth daily at 6 PM. (Patient taking differently: Take 80 mg by mouth every evening. ), Disp: 60 tablet, Rfl: 3 .  colesevelam (WELCHOL) 625 MG tablet, Take 1,875 mg by mouth daily. , Disp: , Rfl:  .  donepezil (ARICEPT) 10 MG tablet, Take 10 mg by mouth every morning. , Disp: , Rfl:  .  esomeprazole (NEXIUM) 40 MG capsule, Take 40 mg by mouth daily as needed (acid reflux). , Disp: , Rfl:  .  ferrous sulfate 325 (65 FE) MG tablet, Take 1 tablet (325 mg total) by mouth 3 (three) times daily with meals., Disp: 60 tablet, Rfl: 3 .  fluticasone  (FLONASE) 50 MCG/ACT nasal spray, Place 1 spray into both nostrils daily as needed for allergies or rhinitis., Disp: , Rfl:  .  guaiFENesin (ROBITUSSIN) 100 MG/5ML SOLN, Take 5 mLs (100 mg total) by mouth every 6 (six) hours as needed for cough or to loosen phlegm., Disp: 1200 mL, Rfl: 2 .  insulin glargine (LANTUS SOLOSTAR) 100 UNIT/ML injection, Inject 20-40 Units into the skin at bedtime as needed (for BGL 150 or greater). , Disp: , Rfl:  .  memantine (NAMENDA) 5 MG tablet, Take 5 mg by mouth 2 (two) times daily., Disp: , Rfl:  .  metoprolol tartrate (LOPRESSOR) 25 MG tablet, Take 0.5 tablets (12.5 mg total) by mouth 2 (two) times daily., Disp: 60 tablet, Rfl: 3 .  nabumetone (RELAFEN) 750 MG tablet, Take 750 mg by mouth 2 (two) times daily as needed for mild pain., Disp: , Rfl:  .  nitroGLYCERIN (NITROSTAT) 0.4 MG SL tablet, Place 1 tablet (0.4 mg total) under the tongue every 5 (five) minutes as needed for chest pain., Disp: 30 tablet, Rfl: 3 .  omega-3 acid ethyl esters (LOVAZA) 1 G capsule, Take 2 g by mouth daily. , Disp: , Rfl:  .  rOPINIRole (REQUIP) 2 MG tablet, Take 1-2 mg by mouth See admin instructions. Take 2 mg by mouth at bedtime and also 1-2 mg once during the day as needed for restless legs, Disp: , Rfl:  .  spironolactone (ALDACTONE) 25 MG tablet, Take 0.5 tablets (12.5 mg total) by mouth daily., Disp: 30 tablet, Rfl: 3 .  SYMBICORT 160-4.5 MCG/ACT inhaler, Inhale 2 puffs into the lungs 2 (two) times daily., Disp: , Rfl:  .  tamsulosin (FLOMAX) 0.4 MG CAPS capsule, Take 0.4 mg by mouth daily after supper. , Disp: , Rfl:  .  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  Daughter age 7 has a heart murmur and will be evaluated soon.  He is accompanied by his wife. He smoked for a short time when he was a young man.. he drinks alcohol.  he does not  use drugs.  Family History: Family History  Problem Relation Age of Onset  . Stroke Mother   . Hypertension Mother   . Heart attack Father   . Heart murmur Father   . Hypertension Sister   Significant history of coronary disease: His father had a  bypass surgery in 5 years later a second surgery with the sudden death perioperatively.  A sister 5 years younger with a heart murmur.  A brother 59 years older has had a TAVR procedure at age 49.  Review of Systems: See HPI. He has been on CPAP for 20 years due to OSA.  He has had a number of surgeries to remove nasal polyps and do nasal septoplasty. He sees occasional blood on the toilet tissue from hemorrhoids.  He has not had a colonoscopy for over 10 years. Remaining ROS negative.  Physical Exam: Blood pressure (!) 114/54, pulse 72, temperature 98.1 F (36.7 C), temperature source Oral, height _0  (1.727 m), weight 198 lb 3.2 oz (89.9 kg), SpO2 99 %. Wt Readings from Last 3 Encounters:  05/21/18 198 lb 3.2 oz (89.9 kg)  05/07/18 198 lb 6.4 oz (90 kg)  04/03/18 192 lb (87.1 kg)     General appearance: Well-nourished Caucasian man HENNT: Pharynx no erythema, exudate, mass, or ulcer. No thyromegaly or thyroid nodules Lymph nodes: No cervical, supraclavicular, or axillary lymphadenopathy Breasts:  Lungs: Clear to auscultation, resonant to percussion throughout Heart: Regular rhythm, 3-6/6 aortic systolic murmur, no gallop, no rub, no click, no edema. Abdomen: Soft, nontender, normal bowel sounds, no mass, no organomegaly Extremities: No edema, no calf tenderness Musculoskeletal: no joint deformities GU: Vascular: Carotid pulses 2+, right carotid bruit versus transmitted aortic murmur,  Neurologic: Alert, oriented, PERRLA, , cranial nerves grossly normal, motor strength 5 over 5, reflexes 1+ symmetric at the biceps, absent symmetric at the patellae., upper body coordination normal, gait normal, Skin: No rash.  Multiple ecchymoses on  the skin of his forearms and dorsa of his hands.    Lab Results: Lab Results  Component Value Date   WBC 4.5 04/30/2018   HGB 9.2 (L) 04/30/2018   HCT 28.8 (L) 04/30/2018   MCV 87.3 04/30/2018   PLT 229 04/30/2018     Chemistry      Component Value Date/Time   NA 137 04/30/2018 1442   K 3.2 (L) 04/30/2018 1442   CL 100 04/30/2018 1442   CO2 25 04/30/2018 1442   BUN 23 04/30/2018 1442   CREATININE 1.23 04/30/2018 1442      Component Value Date/Time   CALCIUM 8.9 04/30/2018 1442   ALKPHOS 97 03/15/2018 1753   AST 20 03/15/2018 1753   ALT 25 03/15/2018 1753   BILITOT 0.6 03/15/2018 1753     Hemoglobin today 10.6    Review of peripheral blood film: Previous review was unremarkable for spherocytes, schistocytes, or polychromasia.  Repeat today pending   Radiological Studies: No results found.    Impression: Multifactorial normochromic anemia with components of recent surgical blood loss, acute illness with an acute MI, and progressive aortic stenosis. No evidence for a hemolytic process and specifically no signs of hemolysis of his aortic valve or an acquired von Willebrand's syndrome.    Recommendation: Stable to proceed with surgery as planned. I anticipate that he will need transfusion support for this type of surgery.     Murriel Hopper, MD, [place]  Hematology-Oncology/Internal Medicine  05/21/2018, 3:36 PM

## 2018-05-21 NOTE — Patient Instructions (Signed)
To lab today Return visit 6-8 weeks lab day of viist

## 2018-05-22 ENCOUNTER — Telehealth: Payer: Self-pay | Admitting: *Deleted

## 2018-05-22 NOTE — Telephone Encounter (Signed)
Called pt - spoke to his wife; informed "Hb up to 10.6" per Dr Beryle Beams. Stated "wow" and she will inform the pt.

## 2018-05-22 NOTE — Telephone Encounter (Signed)
-----   Message from Annia Belt, MD sent at 05/21/2018  5:21 PM EDT ----- Call pt: Hb up to 10.6

## 2018-05-28 ENCOUNTER — Encounter: Payer: Medicare Other | Admitting: Thoracic Surgery (Cardiothoracic Vascular Surgery)

## 2018-05-28 ENCOUNTER — Ambulatory Visit (HOSPITAL_COMMUNITY)
Admission: RE | Admit: 2018-05-28 | Discharge: 2018-05-28 | Disposition: A | Discharge status: Home / Self Care | Payer: Medicare Other | Source: Ambulatory Visit | Attending: Thoracic Surgery (Cardiothoracic Vascular Surgery) | Admitting: Thoracic Surgery (Cardiothoracic Vascular Surgery)

## 2018-05-28 ENCOUNTER — Ambulatory Visit (HOSPITAL_BASED_OUTPATIENT_CLINIC_OR_DEPARTMENT_OTHER)
Admission: RE | Admit: 2018-05-28 | Discharge: 2018-05-28 | Disposition: A | Discharge status: Home / Self Care | Payer: Medicare Other | Source: Ambulatory Visit | Attending: Thoracic Surgery (Cardiothoracic Vascular Surgery) | Admitting: Thoracic Surgery (Cardiothoracic Vascular Surgery)

## 2018-05-28 ENCOUNTER — Encounter (HOSPITAL_COMMUNITY): Payer: Self-pay

## 2018-05-28 ENCOUNTER — Other Ambulatory Visit: Payer: Self-pay

## 2018-05-28 ENCOUNTER — Encounter (HOSPITAL_COMMUNITY)
Admission: RE | Admit: 2018-05-28 | Discharge: 2018-05-28 | Disposition: A | Discharge status: Home / Self Care | Payer: Medicare Other | Source: Ambulatory Visit | Attending: Thoracic Surgery (Cardiothoracic Vascular Surgery) | Admitting: Thoracic Surgery (Cardiothoracic Vascular Surgery)

## 2018-05-28 VITALS — Ht 68.0 in | Wt 198.0 lb

## 2018-05-28 DIAGNOSIS — I517 Cardiomegaly: Secondary | ICD-10-CM

## 2018-05-28 DIAGNOSIS — I440 Atrioventricular block, first degree: Secondary | ICD-10-CM | POA: Insufficient documentation

## 2018-05-28 DIAGNOSIS — G5622 Lesion of ulnar nerve, left upper limb: Secondary | ICD-10-CM | POA: Insufficient documentation

## 2018-05-28 DIAGNOSIS — Z79891 Long term (current) use of opiate analgesic: Secondary | ICD-10-CM | POA: Insufficient documentation

## 2018-05-28 DIAGNOSIS — I2510 Atherosclerotic heart disease of native coronary artery without angina pectoris: Secondary | ICD-10-CM

## 2018-05-28 DIAGNOSIS — I350 Nonrheumatic aortic (valve) stenosis: Secondary | ICD-10-CM | POA: Diagnosis not present

## 2018-05-28 DIAGNOSIS — I480 Paroxysmal atrial fibrillation: Secondary | ICD-10-CM | POA: Insufficient documentation

## 2018-05-28 DIAGNOSIS — R9431 Abnormal electrocardiogram [ECG] [EKG]: Secondary | ICD-10-CM

## 2018-05-28 DIAGNOSIS — Z7982 Long term (current) use of aspirin: Secondary | ICD-10-CM | POA: Insufficient documentation

## 2018-05-28 DIAGNOSIS — G5621 Lesion of ulnar nerve, right upper limb: Secondary | ICD-10-CM | POA: Insufficient documentation

## 2018-05-28 DIAGNOSIS — Z794 Long term (current) use of insulin: Secondary | ICD-10-CM

## 2018-05-28 DIAGNOSIS — Z79899 Other long term (current) drug therapy: Secondary | ICD-10-CM

## 2018-05-28 DIAGNOSIS — Z01818 Encounter for other preprocedural examination: Secondary | ICD-10-CM

## 2018-05-28 DIAGNOSIS — E119 Type 2 diabetes mellitus without complications: Secondary | ICD-10-CM | POA: Insufficient documentation

## 2018-05-28 HISTORY — DX: Acute myocardial infarction, unspecified: I21.9

## 2018-05-28 LAB — GLUCOSE, CAPILLARY: Glucose-Capillary: 125 mg/dL — ABNORMAL HIGH (ref 70–99)

## 2018-05-28 LAB — BLOOD GAS, ARTERIAL
Acid-Base Excess: 2.3 mmol/L — ABNORMAL HIGH (ref 0.0–2.0)
Bicarbonate: 25.9 mmol/L (ref 20.0–28.0)
Drawn by: 421801
FIO2: 21
O2 Saturation: 97.9 %
Patient temperature: 98.6
pCO2 arterial: 36.6 mmHg (ref 32.0–48.0)
pH, Arterial: 7.464 — ABNORMAL HIGH (ref 7.350–7.450)
pO2, Arterial: 102 mmHg (ref 83.0–108.0)

## 2018-05-28 LAB — HEMOGLOBIN A1C
Hgb A1c MFr Bld: 5.8 % — ABNORMAL HIGH (ref 4.8–5.6)
Mean Plasma Glucose: 119.76 mg/dL

## 2018-05-28 LAB — COMPREHENSIVE METABOLIC PANEL
ALT: 23 U/L (ref 0–44)
AST: 18 U/L (ref 15–41)
Albumin: 3.6 g/dL (ref 3.5–5.0)
Alkaline Phosphatase: 47 U/L (ref 38–126)
Anion gap: 11 (ref 5–15)
BUN: 15 mg/dL (ref 8–23)
CO2: 22 mmol/L (ref 22–32)
Calcium: 8.8 mg/dL — ABNORMAL LOW (ref 8.9–10.3)
Chloride: 105 mmol/L (ref 98–111)
Creatinine, Ser: 0.9 mg/dL (ref 0.61–1.24)
GFR calc Af Amer: >60 mL/min (ref >60)
GFR calc non Af Amer: >60 mL/min (ref >60)
Glucose, Bld: 121 mg/dL — ABNORMAL HIGH (ref 70–99)
Potassium: 3.5 mmol/L (ref 3.5–5.1)
Sodium: 138 mmol/L (ref 135–145)
Total Bilirubin: 0.4 mg/dL (ref 0.3–1.2)
Total Protein: 6.5 g/dL (ref 6.5–8.1)

## 2018-05-28 LAB — CBC
HCT: 27.7 % — ABNORMAL LOW (ref 39.0–52.0)
Hemoglobin: 9 g/dL — ABNORMAL LOW (ref 13.0–17.0)
MCH: 28.5 pg (ref 26.0–34.0)
MCHC: 32.5 g/dL (ref 30.0–36.0)
MCV: 87.7 fL (ref 80.0–100.0)
Platelets: 218 10*3/uL (ref 150–400)
RBC: 3.16 MIL/uL — ABNORMAL LOW (ref 4.22–5.81)
RDW: 15.6 % — ABNORMAL HIGH (ref 11.5–15.5)
WBC: 4.2 10*3/uL (ref 4.0–10.5)
nRBC: 0 % (ref 0.0–0.2)

## 2018-05-28 LAB — URINALYSIS, ROUTINE W REFLEX MICROSCOPIC
Bilirubin Urine: NEGATIVE
Glucose, UA: NEGATIVE mg/dL
Hgb urine dipstick: NEGATIVE
Ketones, ur: NEGATIVE mg/dL
Leukocytes, UA: NEGATIVE
Nitrite: NEGATIVE
Protein, ur: NEGATIVE mg/dL
Specific Gravity, Urine: 1.004 — ABNORMAL LOW (ref 1.005–1.030)
pH: 5 (ref 5.0–8.0)

## 2018-05-28 LAB — SURGICAL PCR SCREEN
MRSA, PCR: NEGATIVE
Staphylococcus aureus: POSITIVE — AB

## 2018-05-28 LAB — APTT: aPTT: 31 seconds (ref 24–36)

## 2018-05-28 LAB — PROTIME-INR
INR: 1.11
Prothrombin Time: 14.2 seconds (ref 11.4–15.2)

## 2018-05-28 NOTE — Pre-Procedure Instructions (Signed)
Cody Phelps  05/28/2018      [id number] - [place], [place] - [address] [place] [address] Phone: [phone] Fax: [phone]    Your procedure is scheduled on October 30.  Report to [facility] Admitting at 5:30 A.M.  Call this number if you have problems the morning of surgery:  [phone]   Remember:  Do not eat or drink after midnight.     Take these medicines the morning of surgery with A SIP OF WATER   Amiodarone (Pacerone)  Donepezil (Aricept)  Nexium  Memantine (Namenda)  Metoprolol (Lopressor)   WHAT DO I DO ABOUT MY DIABETES MEDICATION?   Marland Kitchen Do not take oral diabetes medicines (pills) the morning of surgery.  . THE NIGHT BEFORE SURGERY, take 50% of dose of Lantus insulin.      . The day of surgery, do not take other diabetes injectables, including Byetta (exenatide), Bydureon (exenatide ER), Victoza (liraglutide), or Trulicity (dulaglutide).  . If your CBG is greater than 220 mg/dL, you may take  of your sliding scale (correction) dose of insulin.   How to Manage Your Diabetes Before and After Surgery  Why is it important to control my blood sugar before and after surgery? . Improving blood sugar levels before and after surgery helps healing and can limit problems. . A way of improving blood sugar control is eating a healthy diet by: o  Eating less sugar and carbohydrates o  Increasing activity/exercise o  Talking with your doctor about reaching your blood sugar goals . High blood sugars (greater than 180 mg/dL) can raise your risk of infections and slow your recovery, so you will need to focus on controlling your diabetes during the weeks before surgery. . Make sure that the doctor who takes care of your diabetes knows about your planned surgery including the date and location.  How do I manage my blood sugar before surgery? . Check your blood sugar at least 4 times a day, starting  2 days before surgery, to make sure that the level is not too high or low. o Check your blood sugar the morning of your surgery when you wake up and every 2 hours until you get to the Short Stay unit. . If your blood sugar is less than 70 mg/dL, you will need to treat for low blood sugar: o Do not take insulin. o Treat a low blood sugar (less than 70 mg/dL) with  cup of clear juice (cranberry or apple), 4 glucose tablets, OR glucose gel. o Recheck blood sugar in 15 minutes after treatment (to make sure it is greater than 70 mg/dL). If your blood sugar is not greater than 70 mg/dL on recheck, call [phone] for further instructions. . Report your blood sugar to the short stay nurse when you get to Short Stay.  . If you are admitted to the hospital after surgery: o Your blood sugar will be checked by the staff and you will probably be given insulin after surgery (instead of oral diabetes medicines) to make sure you have good blood sugar levels. o The goal for blood sugar control after surgery is 80-180 mg/dL.    - Preparing For Surgery  Before surgery, you can play an important role. Because skin is not sterile, your skin needs to be as free of germs as possible. You can reduce the number of germs on your skin by washing with  CHG (chlorahexidine gluconate) Soap before surgery.  CHG is an antiseptic cleaner which kills germs and bonds with the skin to continue killing germs even after washing.    Oral Hygiene is also important to reduce your risk of infection.  Remember - BRUSH YOUR TEETH THE MORNING OF SURGERY WITH YOUR REGULAR TOOTHPASTE  Please do not use if you have an allergy to CHG or antibacterial soaps. If your skin becomes reddened/irritated stop using the CHG.  Do not shave (including legs and underarms) for at least 48 hours prior to first CHG shower. It is OK to shave your face.  Please follow these instructions carefully.   1. Shower the NIGHT BEFORE SURGERY and the  MORNING OF SURGERY with CHG.   2. If you chose to wash your hair, wash your hair first as usual with your normal shampoo.  3. After you shampoo, rinse your hair and body thoroughly to remove the shampoo.  4. Use CHG as you would any other liquid soap. You can apply CHG directly to the skin and wash gently with a scrungie or a clean washcloth.   5. Apply the CHG Soap to your body ONLY FROM THE NECK DOWN.  Do not use on open wounds or open sores. Avoid contact with your eyes, ears, mouth and genitals (private parts). Wash Face and genitals (private parts)  with your normal soap.  6. Wash thoroughly, paying special attention to the area where your surgery will be performed.  7. Thoroughly rinse your body with warm water from the neck down.  8. DO NOT shower/wash with your normal soap after using and rinsing off the CHG Soap.  9. Pat yourself dry with a CLEAN TOWEL.  10. Wear CLEAN PAJAMAS to bed the night before surgery, wear comfortable clothes the morning of surgery  11. Place CLEAN SHEETS on your bed the night of your first shower and DO NOT SLEEP WITH PETS.    Day of Surgery:  Do not apply any deodorants/lotions.  Please wear clean clothes to the hospital/surgery center.   Remember to brush your teeth WITH YOUR REGULAR TOOTHPASTE.       Do not wear jewelry, make-up or nail polish.  Do not wear lotions, powders, or perfumes, or deodorant.  Do not shave 48 hours prior to surgery.  Men may shave face and neck.  Do not bring valuables to the hospital.  [facility] is not responsible for any belongings or valuables.  Contacts, dentures or bridgework may not be worn into surgery.  Leave your suitcase in the car.  After surgery it may be brought to your room.  For patients admitted to the hospital, discharge time will be determined by your treatment team.  Patients discharged the day of surgery will not be allowed to drive home.    Please read over the following fact sheets  that you were given. Coughing and Deep Breathing, MRSA Information and Surgical Site Infection Prevention

## 2018-05-28 NOTE — Progress Notes (Addendum)
Pre cardiac surgery prelim   Right Carotid:Velocities in the right ICA are consistent with a 1-39% stenosis.  Left Carotid: Velocities in the left ICA are consistent with a 1-39% stenosis.    Right Upper Extremity: Doppler waveforms decrease >50% with right radial compression. Doppler waveforms remain within normal limits with right ulnar compression.   Left Upper Extremity: Doppler waveforms remain within normal limits with left radial compression. Doppler waveforms remain within normal limits with left ulnar compression.     Landry Mellow, RDMS, RVT

## 2018-05-28 NOTE — Progress Notes (Signed)
[facility] for positive PCR of Staph. Pt's wife notified of results and need to pick up Rx. She voiced understanding.

## 2018-05-28 NOTE — Progress Notes (Addendum)
PCP: [name]  Cardiologist: Dr. [name]  DM: Type 2 Fasting Sugars 140  SA: yes, wears CPAP  Stress Test: >5 years   Eliquis & ASA: Stopped 05-21-18  Pt denies SOB, cough, chest pain, or fever  Pt states understanding of instructions given for day of surgery.  Recently had MI in July, chart sent to [facility].  Hgb: 9.0. Sent chart to Anesthesia for review. Called and left vm with Levonne Spiller in regards to low Hgb.

## 2018-05-29 ENCOUNTER — Encounter: Payer: Self-pay | Admitting: Thoracic Surgery (Cardiothoracic Vascular Surgery)

## 2018-05-29 ENCOUNTER — Ambulatory Visit (INDEPENDENT_AMBULATORY_CARE_PROVIDER_SITE_OTHER): Payer: Medicare Other | Admitting: Thoracic Surgery (Cardiothoracic Vascular Surgery)

## 2018-05-29 ENCOUNTER — Encounter (HOSPITAL_COMMUNITY): Payer: Self-pay | Admitting: Thoracic Surgery (Cardiothoracic Vascular Surgery)

## 2018-05-29 ENCOUNTER — Other Ambulatory Visit: Payer: Self-pay

## 2018-05-29 VITALS — BP 126/62 | HR 74 | Resp 18 | Ht 68.0 in | Wt 198.0 lb

## 2018-05-29 DIAGNOSIS — I313 Pericardial effusion (noninflammatory): Secondary | ICD-10-CM | POA: Diagnosis not present

## 2018-05-29 DIAGNOSIS — I350 Nonrheumatic aortic (valve) stenosis: Secondary | ICD-10-CM | POA: Diagnosis not present

## 2018-05-29 DIAGNOSIS — I480 Paroxysmal atrial fibrillation: Secondary | ICD-10-CM | POA: Diagnosis not present

## 2018-05-29 DIAGNOSIS — I3139 Other pericardial effusion (noninflammatory): Secondary | ICD-10-CM

## 2018-05-29 DIAGNOSIS — I2510 Atherosclerotic heart disease of native coronary artery without angina pectoris: Secondary | ICD-10-CM | POA: Diagnosis not present

## 2018-05-29 MED ORDER — EPINEPHRINE PF 1 MG/ML IJ SOLN
0.0000 ug/min | INTRAVENOUS | Status: DC
  Filled 2018-05-29: qty 4

## 2018-05-29 MED ORDER — KENNESTONE BLOOD CARDIOPLEGIA VIAL
13.0000 mL | Freq: Once | Status: DC
  Filled 2018-05-29: qty 13

## 2018-05-29 MED ORDER — KENNESTONE BLOOD CARDIOPLEGIA (KBC) MANNITOL SYRINGE (20%, 32ML)
32.0000 mL | Freq: Once | INTRAVENOUS | Status: DC
  Filled 2018-05-29: qty 32

## 2018-05-29 MED ORDER — PHENYLEPHRINE HCL-NACL 20-0.9 MG/250ML-% IV SOLN
30.0000 ug/min | INTRAVENOUS | Status: AC
  Administered 2018-05-30: 25 ug/min via INTRAVENOUS
  Filled 2018-05-29: qty 250

## 2018-05-29 MED ORDER — NITROGLYCERIN IN D5W 200-5 MCG/ML-% IV SOLN
2.0000 ug/min | INTRAVENOUS | Status: DC
  Filled 2018-05-29: qty 250

## 2018-05-29 MED ORDER — VANCOMYCIN HCL 10 G IV SOLR
1500.0000 mg | INTRAVENOUS | Status: AC
  Administered 2018-05-30: 1500 mg via INTRAVENOUS
  Filled 2018-05-29: qty 1500

## 2018-05-29 MED ORDER — POTASSIUM CHLORIDE 2 MEQ/ML IV SOLN
80.0000 meq | INTRAVENOUS | Status: DC
  Filled 2018-05-29: qty 40

## 2018-05-29 MED ORDER — NOREPINEPHRINE 4 MG/250ML-% IV SOLN
0.0000 ug/min | INTRAVENOUS | Status: DC
  Filled 2018-05-29: qty 250

## 2018-05-29 MED ORDER — TRANEXAMIC ACID (OHS) PUMP PRIME SOLUTION
2.0000 mg/kg | INTRAVENOUS | Status: DC
  Filled 2018-05-29: qty 1.8

## 2018-05-29 MED ORDER — DOPAMINE-DEXTROSE 3.2-5 MG/ML-% IV SOLN
0.0000 ug/kg/min | INTRAVENOUS | Status: DC
  Filled 2018-05-29: qty 250

## 2018-05-29 MED ORDER — SODIUM CHLORIDE 0.9 % IV SOLN
750.0000 mg | INTRAVENOUS | Status: DC
  Filled 2018-05-29: qty 750

## 2018-05-29 MED ORDER — TRANEXAMIC ACID 1000 MG/10ML IV SOLN
1.5000 mg/kg/h | INTRAVENOUS | Status: AC
  Administered 2018-05-30: 1.5 mg/kg/h via INTRAVENOUS
  Filled 2018-05-29: qty 25

## 2018-05-29 MED ORDER — DEXMEDETOMIDINE HCL IN NACL 400 MCG/100ML IV SOLN
0.1000 ug/kg/h | INTRAVENOUS | Status: AC
  Administered 2018-05-30: .3 ug/kg/h via INTRAVENOUS
  Filled 2018-05-29: qty 100

## 2018-05-29 MED ORDER — MILRINONE LACTATE IN DEXTROSE 20-5 MG/100ML-% IV SOLN
0.3000 ug/kg/min | INTRAVENOUS | Status: DC
  Filled 2018-05-29: qty 100

## 2018-05-29 MED ORDER — VANCOMYCIN HCL 1000 MG IV SOLR
INTRAVENOUS | Status: AC
  Administered 2018-05-30: 1000 mL
  Filled 2018-05-29: qty 1000

## 2018-05-29 MED ORDER — INSULIN REGULAR(HUMAN) IN NACL 100-0.9 UT/100ML-% IV SOLN
INTRAVENOUS | Status: AC
  Administered 2018-05-30: .6 [IU]/h via INTRAVENOUS
  Filled 2018-05-29: qty 100

## 2018-05-29 MED ORDER — PLASMA-LYTE 148 IV SOLN
INTRAVENOUS | Status: AC
  Administered 2018-05-30: 500 mL
  Filled 2018-05-29: qty 2.5

## 2018-05-29 MED ORDER — SODIUM CHLORIDE 0.9 % IV SOLN
1.5000 g | INTRAVENOUS | Status: AC
  Administered 2018-05-30: 1.5 g via INTRAVENOUS
  Filled 2018-05-29: qty 1.5

## 2018-05-29 MED ORDER — TRANEXAMIC ACID (OHS) BOLUS VIA INFUSION
15.0000 mg/kg | INTRAVENOUS | Status: AC
  Administered 2018-05-30: 1353 mg via INTRAVENOUS
  Filled 2018-05-29: qty 1353

## 2018-05-29 MED ORDER — MAGNESIUM SULFATE 50 % IJ SOLN
40.0000 meq | INTRAMUSCULAR | Status: DC
  Filled 2018-05-29: qty 9.85

## 2018-05-29 MED ORDER — SODIUM CHLORIDE 0.9 % IV SOLN
INTRAVENOUS | Status: DC
  Filled 2018-05-29: qty 30

## 2018-05-29 NOTE — Progress Notes (Signed)
This encounter was created in error - please disregard.

## 2018-05-29 NOTE — Patient Instructions (Signed)
Do not take Eliquis or aspirin  Continue taking all other medications without change through the day before surgery.  Have nothing to eat or drink after midnight the night before surgery.  On the morning of surgery take only Nexium and metoprolol with a sip of water.

## 2018-05-29 NOTE — Progress Notes (Signed)
[address]       [place] [address]             [phone]     CARDIOTHORACIC SURGERY OFFICE NOTE  Referring Provider is Adrian Prows, MD PCP is [name], MD   HPI:  Patient returns the office today with plans to proceed with aortic valve replacement, Maze procedure, drainage of pericardial effusion, and possible coronary artery bypass grafting tomorrow.  He was last seen here in our office on May 07, 2018.  Since then he reports no new problems or complaints.  He stopped taking Eliquis and aspirin last week in anticipation of surgery.   Current Outpatient Medications  Medication Sig Dispense Refill  . acetaminophen (TYLENOL) 325 MG tablet Take 1 tablet (325 mg total) by mouth every 4 (four) hours as needed (headache, pain). 60 tablet 3  . amiodarone (PACERONE) 200 MG tablet Take 1 tablet (200 mg total) by mouth 2 (two) times daily. (Patient taking differently: Take 200 mg by mouth daily. ) 30 tablet 0  . apixaban (ELIQUIS) 5 MG TABS tablet Take 1 tablet (5 mg total) by mouth 2 (two) times daily. 60 tablet 3  . aspirin EC 81 MG tablet Take 81 mg by mouth daily.     Marland Kitchen atorvastatin (LIPITOR) 80 MG tablet Take 1 tablet (80 mg total) by mouth daily at 6 PM. (Patient taking differently: Take 80 mg by mouth every evening. ) 60 tablet 3  . colesevelam (WELCHOL) 625 MG tablet Take 1,875 mg by mouth daily.     Marland Kitchen donepezil (ARICEPT) 10 MG tablet Take 10 mg by mouth every morning.     Marland Kitchen esomeprazole (NEXIUM) 40 MG capsule Take 40 mg by mouth daily.     . ferrous sulfate 325 (65 FE) MG tablet Take 1 tablet (325 mg total) by mouth 3 (three) times daily with meals. (Patient taking differently: Take 325 mg by mouth daily with breakfast. ) 60 tablet 3  . guaiFENesin (ROBITUSSIN) 100 MG/5ML SOLN Take 5 mLs (100 mg total) by mouth every 6 (six) hours as needed for cough or to loosen phlegm. 1200 mL 2  . insulin glargine (LANTUS SOLOSTAR) 100 UNIT/ML injection Inject  20-44 Units into the skin at bedtime as needed (for BGL >150).     . memantine (NAMENDA) 5 MG tablet Take 5 mg by mouth 2 (two) times daily.    . metoprolol tartrate (LOPRESSOR) 25 MG tablet Take 0.5 tablets (12.5 mg total) by mouth 2 (two) times daily. 60 tablet 3  . mirabegron ER (MYRBETRIQ) 50 MG TB24 tablet Take 50 mg by mouth daily.    . nitroGLYCERIN (NITROSTAT) 0.4 MG SL tablet Place 1 tablet (0.4 mg total) under the tongue every 5 (five) minutes as needed for chest pain. 30 tablet 3  . omega-3 acid ethyl esters (LOVAZA) 1 G capsule Take 2 g by mouth daily.     Marland Kitchen rOPINIRole (REQUIP) 2 MG tablet Take 2 mg by mouth 2 (two) times daily.     Marland Kitchen spironolactone (ALDACTONE) 25 MG tablet Take 0.5 tablets (12.5 mg total) by mouth daily. 30 tablet 3  . tamsulosin (FLOMAX) 0.4 MG CAPS capsule Take 0.4 mg by mouth daily after supper.     . torsemide (DEMADEX) 20 MG tablet Take 1 tablet (20 mg total) by mouth 2 (two) times daily. 60 tablet 3   No current facility-administered medications for this visit.    Facility-Administered Medications Ordered in  Other Visits  Medication Dose Route Frequency Provider Last Rate Last Dose  . [START ON 05/30/2018] cefUROXime (ZINACEF) 1.5 g in sodium chloride 0.9 % 100 mL IVPB  1.5 g Intravenous To OR [name], MD      . Derrill Memo ON 05/30/2018] cefUROXime (ZINACEF) 750 mg in sodium chloride 0.9 % 100 mL IVPB  750 mg Intravenous To OR [name], MD      . Derrill Memo ON 05/30/2018] dexmedetomidine (PRECEDEX) 400 MCG/100ML (4 mcg/mL) infusion  0.1-0.7 mcg/kg/hr Intravenous To OR [name], MD      . Derrill Memo ON 05/30/2018] DOPamine (INTROPIN) 800 mg in dextrose 5 % 250 mL (3.2 mg/mL) infusion  0-10 mcg/kg/min Intravenous To OR [name], MD      . Derrill Memo ON 05/30/2018] EPINEPHrine (ADRENALIN) 4 mg in dextrose 5 % 250 mL (0.016 mg/mL) infusion  0-10 mcg/min Intravenous To OR [name], MD      . Derrill Memo ON 05/30/2018] heparin 2,500 Units,  papaverine 30 mg in electrolyte-148 (PLASMALYTE-148) 500 mL irrigation   Irrigation To OR [name], MD      . Derrill Memo ON 05/30/2018] heparin 30,000 units/NS 1000 mL solution for CELLSAVER   Other To OR [name], MD      . Derrill Memo ON 05/30/2018] insulin regular, human (MYXREDLIN) 100 units/ 100 mL infusion   Intravenous To OR [name], MD      . Derrill Memo ON 05/30/2018] [facility] Cardioplegia (KBC) lidocaine 2% Syringe (20mL)  13 mL Intracoronary Once [name], MD      . Derrill Memo ON 05/30/2018] [facility] Blood Cardioplegia (KBC) lidocaine 2% Syringe (69mL)  13 mL Intracoronary Once [name], MD      . Derrill Memo ON 05/30/2018] [facility] Blood Cardioplegia (KBC) mannitol 20% Syringe (22mL)  32 mL Intracoronary Once [name], MD      . Derrill Memo ON 05/30/2018] [facility] Blood Cardioplegia (KBC) mannitol 20% Syringe (24mL)  32 mL Intracoronary Once [name], MD      . Derrill Memo ON 05/30/2018] magnesium sulfate (IV Push/IM) injection 40 mEq  40 mEq Other To OR [name], MD      . Derrill Memo ON 05/30/2018] milrinone (PRIMACOR) 20 MG/100 ML (0.2 mg/mL) infusion  0.3 mcg/kg/min Intravenous To OR [name], MD      . Derrill Memo ON 05/30/2018] nitroGLYCERIN 50 mg in dextrose 5 % 250 mL (0.2 mg/mL) infusion  2-200 mcg/min Intravenous To OR [name], MD      . Derrill Memo ON 05/30/2018] norepinephrine (LEVOPHED) 4mg  in D5W 279mL premix infusion  0-40 mcg/min Intravenous Titrated [name], MD      . Derrill Memo ON 05/30/2018] phenylephrine (NEOSYNEPHRINE) 20-0.9 MG/250ML-% infusion  30-200 mcg/min Intravenous To OR [name], MD      . Derrill Memo ON 05/30/2018] potassium chloride injection 80 mEq  80 mEq Other To OR [name], MD      . Derrill Memo ON 05/30/2018] tranexamic acid (CYKLOKAPRON) 2,500 mg in sodium chloride 0.9 % 250 mL (10 mg/mL) infusion  1.5 mg/kg/hr Intravenous To OR [name], MD      . Derrill Memo ON 05/30/2018] tranexamic acid  (CYKLOKAPRON) bolus via infusion - over 30 minutes 1,353 mg  15 mg/kg Intravenous To OR [name], MD      . Derrill Memo ON 05/30/2018] tranexamic acid (CYKLOKAPRON) pump prime solution 180 mg  2 mg/kg Intracatheter To OR [name], MD      . [  START ON 05/30/2018] vancomycin (VANCOCIN) 1,000 mg in sodium chloride 0.9 % 1,000 mL irrigation   Irrigation To OR [name], MD      . Derrill Memo ON 05/30/2018] vancomycin (VANCOCIN) 1,500 mg in sodium chloride 0.9 % 250 mL IVPB  1,500 mg Intravenous To OR [name], MD          Physical Exam:   BP 126/62 (BP Location: Right Arm, Patient Position: Sitting, Cuff Size: Normal)   Pulse 74   Resp 18   Ht 5\' 8"  (1.727 m)   Wt 198 lb (89.8 kg)   SpO2 97% Comment: RA  BMI 30.11 kg/m   General:  Well-appearing  Chest:   Clear to auscultation  CV:   Regular rate and rhythm with prominent systolic murmur  Incisions:  n/a  Abdomen:  Soft nontender  Extremities:  Warm and well-perfused  Diagnostic Tests:  Results for KNUTE, MAZZUCA" (MRN [id number]) as of 05/29/2018 16:17  Ref. Range 05/28/2018 13:45  Hemoglobin Latest Ref Range: 13.0 - 17.0 g/dL 9.0 (L)  HCT Latest Ref Range: 39.0 - 52.0 % 27.7 (L)     CHEST - 2 VIEW  COMPARISON:  Radiographs of April 03, 2018.  FINDINGS: The heart size and mediastinal contours are within normal limits. Both lungs are clear. The visualized skeletal structures are unremarkable.  IMPRESSION: No active cardiopulmonary disease.   Electronically Signed   By: Marijo Conception, M.D.   On: 05/28/2018 21:06    Impression:  Patient has stage D severe symptomatic aortic stenosis and multivessel coronary artery disease. He presented acutely 1 month ago with anacute anterior wall myocardial infarction.He was found to have multivessel coronary artery disease and underwent attempted PCI and stenting of the culprit left anterior descending coronary artery. However, due to delayed  presentation revascularization was not feasible. The patient subsequent convalescence has been slow and complicated by the development of new onset atrial fibrillation and a moderate sized pericardial effusion likely related to pericarditis and hemopericardium in the setting of recent transmural myocardial infarction and systemic anticoagulation. 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   Patient appears to have made excellent progress over the past month with improved strength and decreased symptoms of exertional shortness of breath.  He now gets short of breath only with moderate level activity, consistent with New York Heart Association functional class II congestive heart failure.  Overall he appears much improved.  I have personally reviewed the patient's several recent transthoracic echocardiograms, diagnostic cardiac catheterization, and CT angiograms. Echocardiograms reveal moderate global left ventricular systolic dysfunction with ejection fraction estimated 40 to 45%. There is severe akinesis of the distal anterior wall and apex consistent with transmural myocardial infarction in the left anterior descending coronary artery territory. The patient also has a moderate sized pericardial effusion which has decreased in size over the past 2 weeks but still persists at this time. The patient has severe aortic stenosis. There is severe calcification, fibrosis, and restricted leaflet mobility involvingall 3 leaflets of the aortic valve. On recent echocardiogram peak velocity across theaortic valve measured 4.6 m/s corresponding to mean transvalvular gradient estimated 49 mmHg. Diagnostic cardiac catheterization reveals severe multivessel coronary artery disease. There is subtotal occlusion of the distal left anterior descending coronary artery and attempts at PCI were unsuccessful. There is fairly  high-grade 80% proximal stenosis of a diagonal branch. There is left dominant coronary circulation. There does  not appear to be significant disease in the left circumflex system although there may be some proximal disease in the small sized posterior descending coronary artery which is a terminal branch of the left circumflex. The right coronary is small and nondominant.  I agree the patient would benefit from aortic valve replacement. Risks associated with conventional surgery would be relatively highand possibly higher than that predicted using the STS risk calculator because of the patient's recent myocardial infarction, likely pericarditis and pericardial effusion, and numerous comorbid medical problems including physical deconditioning with unstable gait and mild cognitive dysfunction with possible early symptoms of Alzheimer's type dementia.  However, he seems to have made considerable progress over the past month.  CT angiography reveals adequate femoral vascular access for transcatheter aortic valve replacement via percutaneous transfemoral approach. Cardiac gated CT angiogram of the heart confirms the presence of anatomical findings consistent with severe aortic stenosis. The patient has significant calcification in the aortic annulus including a fairly bulky area of calcification that extends into the left ventricular outflow tract which would considerably increase risk of development of perivalvular leak and potentially could be associated with increased risk of annular rupture and transcatheter aortic valve replacement   Plan:  The patientand his wifewere again counseled at length regarding treatment alternatives for management of severe symptomatic aortic stenosis. Alternative approaches such as conventional aortic valve replacement, transcatheter aortic valve replacement, and continued medical therapy without intervention were compared and contrasted at length. The risks  associated with conventional surgical aortic valve replacement were discussed in detail, as were expectations for post-operative convalescence. The potential advantages of performing concomitant coronary artery bypass grafting with or without clipping of left atrial appendage and/or Maze procedure were discussed, as well as the benefits from draining what's left of his pericardial effusion. Issues specific to transcatheter aortic valve replacement were discussed including questions about long term valve durability, the potential for paravalvular leak, possible increased risk of need for permanent pacemaker placement, risk of stroke, and other technical complications related to the procedure itself.Concerns regarding the presence of significant calcification in the aortic root were discussed. Long-term prognosis with medical therapy was discussed. This discussion was placed in the context of the patient's own specific clinical presentation and past medical history. All of their questions have been addressed.   The patient desires to proceed with conventional surgical aortic valve replacement with possible coronary artery bypass grafting and Maze procedure at some point in the near future.   Discussion was held comparing the relative risks of mechanical valve replacement with need for lifelong anticoagulation versus use of a bioprosthetic tissue valve and the associated potential for late structural valve deterioration and failure.  This discussion was placed in the context of the patient's particular circumstances, and as a result the patient specifically requests that their valve be replaced using a bioprosthetic tissue valve.  Both the patient and his wife understand and accept all potential risks of surgery including but not limited to risk of death, stroke or other neurologic complication, myocardial infarction, congestive heart failure, respiratory failure, renal failure, bleeding requiring transfusion  and/or reexploration, arrhythmia, infection or other wound complications, pneumonia, pleural and/or pericardial effusion, pulmonary embolus, aortic dissection or other major vascular complication, or delayed complications related to valve repair or replacement including but not limited to structural valve deterioration and failure, thrombosis, embolization, endocarditis, or paravalvular leak, late recurrence of ischemic heart disease.  The relative risks and benefits of performing a maze procedure at the time of their surgery  was discussed at length, including the expected likelihood of long term freedom from recurrent symptomatic atrial fibrillation and/or atrial flutter.  The patient additionally provides consent for long term follow up following surgery including participation in the [facility].  They understand the high likelihood that the patient will need blood transfusion during or following his surgery because of his underlying baseline anemia.  They understand the possibility that coronary artery bypass grafting may not be feasible due to the size and/or disease involving the distal target vessels as well as the presence of scar tissue related to recent pericarditis.  They also understand and accept concerns regarding the patient's degree of physical deconditioning and the potential that his physical recovery might be somewhat slow and prolonged.  All of their questions have been answered.  We tentatively plan to proceed with surgery on May 30, 2018.  The patient will stop taking Eliquis 7 days prior to surgery.  He will return to our office for follow-up on May 28, 2018.   I spent in excess of 15 minutes during the conduct of this office consultation and >50% of this time involved direct face-to-face encounter with the patient for counseling and/or coordination of their care.   Valentina Gu. Roxy Manns, MD 05/29/2018 4:02 PM

## 2018-05-29 NOTE — H&P (Signed)
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  He was originally seen in consultation on April 03, 2018.  At that time he was still struggling with recovery following his recent acute myocardial infarction.  We discussed treatment options at length at that time including a comparison between conventional surgical aortic valve replacement with or without coronary artery bypass grafting and Maze procedure versus transcatheter aortic valve replacement.  Under the circumstances the patient was felt to be relatively poor candidate for conventional surgery, but cardiac gated CT angiogram of the heart revealed anatomical findings relatively unfavorable for transcatheter aortic valve replacement because of extensive calcification in the aortic root.  At that time a decision was made to hold off on making any plans for surgical intervention until the patient had additional time to recover from his previous myocardial infarction.  During the interim the patient has also been seen in consultation by Dr. Beryle Beams because of his history of chronic anemia.  The likely need for blood transfusion  following surgical intervention was anticipated.  Patient returns the office today with plans to proceed with aortic valve replacement, Maze procedure, drainage of pericardial effusion, and possible coronary artery bypass grafting tomorrow.  He was last seen here in our office on May 07, 2018.  Since then he reports no new problems or complaints.  He stopped taking Eliquis and aspirin last week in anticipation of surgery.   Past Medical History:  Diagnosis Date  . Carotid stenosis, left followed by dr [name], cardiologist--- bilateral bruit per note   per dr [name] note [id number]/1017  LICA 51-02% stenosis per duplex 05-14-2014  . Diverticulosis   . ED (erectile dysfunction)   . First degree heart block   . GERD (gastroesophageal reflux disease)   . Heart murmur   . Hypertension    cardiologsit-  dr [name]  . Iron deficiency anemia   . Mixed hyperlipidemia   . Myocardial infarction ([facility])    02/28/18  . OA (osteoarthritis)    right hip  . OSA on CPAP    followed by dr dohmeier, uses CPAP  . PAF (paroxysmal atrial fibrillation) ([name])    a. on Eliquis  . Prostate cancer ([facility])    dx 03-17-2017 (bx)  Stage T2a, Gleason 4+4, PSA  4.43, vol 32.32--  s/p radiatctive prostate seed implants 07-10-2017 then IMRT and ADT  . RLS (restless legs syndrome)   . Scoliosis   . Severe aortic stenosis   . Trigger finger   . Type 2 diabetes mellitus ([place])   . Wears partial dentures    upper and lower    Past Surgical History:  Procedure Laterality Date  . APPENDECTOMY  1988  .  BILATERAL TOTAL ETHMOIDECTOMY AND SPHENOIDECTOMY  01-14-2009    dr bates   [facility]  . CARPAL TUNNEL RELEASE Bilateral 2013  . CATARACT EXTRACTION W/ INTRAOCULAR LENS  IMPLANT, BILATERAL  date?  . CORONARY BALLOON ANGIOPLASTY N/A 03/01/2018   Procedure: CORONARY BALLOON ANGIOPLASTY;  Surgeon: Adrian Prows, MD;  Location: [facility];  Service: Cardiovascular;  Laterality: N/A;  . CORONARY/GRAFT ACUTE MI REVASCULARIZATION N/A  03/01/2018   Procedure: Coronary/Graft Acute MI Revascularization;  Surgeon: Adrian Prows, MD;  Location: [place] [facility];  Service: Cardiovascular;  Laterality: N/A;  . CYSTOSCOPY  07/19/2017   Procedure: CYSTOSCOPY;  Surgeon: Nickie Retort, MD;  Location: [facility];  Service: Urology;;  No seeds found in [place]  . LAMINECTOMY WITH POSTERIOR LATERAL ARTHRODESIS LEVEL 2 N/A 02/12/2014   Procedure: LUMBAR TWO-THREE,LUIMBAR THREE-FOUR LAMINECTOMY/FORAMINOTOMY;POSSIBLE POSTEROLATERAL ARTHRODESIS WITH AUTOGRAFT;  Surgeon: [name], MD;  Location: [facility];  Service: Neurosurgery;  Laterality: N/A;  . LEFT HEART CATH AND CORONARY ANGIOGRAPHY N/A 03/01/2018   Procedure: LEFT HEART CATH AND CORONARY ANGIOGRAPHY;  Surgeon: Adrian Prows, MD;  Location: [facility];  Service: Cardiovascular;  Laterality: N/A;  . ORIF RIGHT ANKLE FX'S  05/05/2001   retained hardware  . PROSTATE BIOPSY  03-17-2017   dr Pilar Jarvis office  . RADIOACTIVE SEED IMPLANT N/A 07/19/2017   Procedure: RADIOACTIVE SEED IMPLANT/BRACHYTHERAPY IMPLANT;  Surgeon: Nickie Retort, MD;  Location: [facility];  Service: Urology;  Laterality: N/A;  69 seeds implanted  . SPACE OAR INSTILLATION N/A 07/19/2017   Procedure: SPACE OAR INSTILLATION;  Surgeon: Nickie Retort, MD;  Location: [facility];  Service: Urology;  Laterality: N/A;  . TEE WITHOUT CARDIOVERSION  03/20/2012   Procedure: TRANSESOPHAGEAL ECHOCARDIOGRAM (TEE);  Surgeon: Laverda Page, MD;  Location: [facility];  Service: Cardiovascular;  Laterality: N/A;  normal LV; normal EF; normal RV; normal LA w/ left atrial appendage very small, normal function, interatrial septum intact without defect; normal RA; trace MR,TR, & PI; mild AV calcification and senile degeneration w/ mild stenosis, AVA 1.7cm^2;;   . TOTAL HIP ARTHROPLASTY Right 10/23/2017   Procedure: TOTAL HIP ARTHROPLASTY ANTERIOR APPROACH;  Surgeon:  Frederik Pear, MD;  Location: [facility];  Service: Orthopedics;  Laterality: Right;  . TRANSTHORACIC ECHOCARDIOGRAM  01-11-2017   dr [name]  (per echo note, no significant change in seveity of AS, no other diagnostic change)   moderate concentric LVH, ef 72%, grade 1 diastolic dysfunction/  moderate LAE/  mild , grade 1 AR w/ moderate AV calcification, mild to moderate restricted AV leaflets w/ moderate AS, AVA 1.16cm^2, peak grandient 6mHg, mean grandient 349mg/  trace MR, mild calcification MV annulus , mild MV leaflet calcification, mild MVS, peak grandient 4.6m66m, mean grandient 2.7mm21m trace TR    Family History  Problem Relation Age of Onset  . Stroke Mother   . Hypertension Mother   . Heart attack Father   . Heart murmur Father   . Hypertension Sister     Social History Social History   Tobacco Use  . Smoking status: Former Smoker    Years: 1.00    Types: Cigarettes    Last attempt to quit: 02/04/1967    Years since quitting: 51.3  . Smokeless tobacco: Never Used  . Tobacco comment: smoked 1 q2-3 days  Substance Use Topics  . Alcohol use: Yes    Comment: occas.  . Drug use: No    Prior to Admission medications  Medication Sig Start Date End Date Taking? Authorizing Provider  amiodarone (PACERONE) 200 MG tablet Take 1 tablet (200 mg total) by mouth 2 (two) times daily. Patient taking differently: Take 200 mg by mouth daily.  03/20/18  Yes Patwardhan, Manish J, MD  atorvastatin (LIPITOR) 80 MG tablet Take 1 tablet (80 mg total) by mouth daily at 6 PM. Patient taking differently: Take 80 mg by mouth every evening.  03/05/18  Yes Patwardhan, [name], MD  colesevelam (WELCHOL) 625 MG tablet Take 1,875 mg by mouth daily.    Yes [provider]  donepezil (ARICEPT) 10 MG tablet Take 10 mg by mouth every morning.  03/19/12  Yes [provider]  esomeprazole (NEXIUM) 40 MG capsule Take 40 mg by mouth daily.    Yes [provider]  ferrous sulfate 325 (65  FE) MG tablet Take 1 tablet (325 mg total) by mouth 3 (three) times daily with meals. Patient taking differently: Take 325 mg by mouth daily with breakfast.  03/20/18  Yes Patwardhan, Manish J, MD  insulin glargine (LANTUS SOLOSTAR) 100 UNIT/ML injection Inject 20-44 Units into the skin at bedtime as needed (for BGL >150).    Yes [provider]  memantine (NAMENDA) 5 MG tablet Take 5 mg by mouth 2 (two) times daily.   Yes [provider]  metoprolol tartrate (LOPRESSOR) 25 MG tablet Take 0.5 tablets (12.5 mg total) by mouth 2 (two) times daily. 03/20/18 03/20/19 Yes Patwardhan, Manish J, MD  mirabegron ER (MYRBETRIQ) 50 MG TB24 tablet Take 50 mg by mouth daily.   Yes [provider]  nitroGLYCERIN (NITROSTAT) 0.4 MG SL tablet Place 1 tablet (0.4 mg total) under the tongue every 5 (five) minutes as needed for chest pain. 03/05/18  Yes Patwardhan, Manish J, MD  rOPINIRole (REQUIP) 2 MG tablet Take 2 mg by mouth 2 (two) times daily.    Yes [provider]  spironolactone (ALDACTONE) 25 MG tablet Take 0.5 tablets (12.5 mg total) by mouth daily. 03/20/18  Yes Patwardhan, Manish J, MD  tamsulosin (FLOMAX) 0.4 MG CAPS capsule Take 0.4 mg by mouth daily after supper.    Yes [provider]  torsemide (DEMADEX) 20 MG tablet Take 1 tablet (20 mg total) by mouth 2 (two) times daily. 03/20/18  Yes Patwardhan, [name], MD  acetaminophen (TYLENOL) 325 MG tablet Take 1 tablet (325 mg total) by mouth every 4 (four) hours as needed (headache, pain). 03/05/18   Patwardhan, [name], MD  apixaban (ELIQUIS) 5 MG TABS tablet Take 1 tablet (5 mg total) by mouth 2 (two) times daily. 03/05/18   Patwardhan, [name], MD  aspirin EC 81 MG tablet Take 81 mg by mouth daily.     [provider]  guaiFENesin (ROBITUSSIN) 100 MG/5ML SOLN Take 5 mLs (100 mg total) by mouth every 6 (six) hours as needed for cough or to loosen phlegm. 03/20/18   Patwardhan, [name], MD  omega-3 acid ethyl  esters (LOVAZA) 1 G capsule Take 2 g by mouth daily.     [provider]    No Known Allergies   Review of Systems:              General:                      decreased appetite, decreased energy, no weight gain, no weight loss, no fever             Cardiac:  no chest pain with exertion, no chest pain at rest, +SOB with exertion, no resting SOB, no PND, + orthopnea, no palpitations, + arrhythmia, + atrial fibrillation, no LE edema, no dizzy spells, no syncope             Respiratory:                 + shortness of breath, no home oxygen, no productive cough, + dry cough, + bronchitis, no wheezing, no hemoptysis, no asthma, no pain with inspiration or cough, + sleep apnea, + CPAP at night             GI:                               no difficulty swallowing, no reflux, no frequent heartburn, no hiatal hernia, no abdominal pain, + constipation, no diarrhea, no hematochezia, no hematemesis, no melena, + black stools since taking iron             GU:                              no dysuria,  + frequency, no urinary tract infection, no hematuria, no enlarged prostate, no kidney stones, no kidney disease             Vascular:                     no pain suggestive of claudication, no pain in feet, + leg cramps, no varicose veins, no DVT, no non-healing foot ulcer             Neuro:                         no stroke, no TIA's, no seizures, no headaches, no temporary blindness one eye,  no slurred speech, no peripheral neuropathy, no chronic pain, + mild instability of gait, + mild memory/cognitive dysfunction             Musculoskeletal:         + arthritis, no joint swelling, no myalgias, + some difficulty walking, decreased mobility              Skin:                            no rash, no itching, no skin infections, no pressure sores or ulcerations             Psych:                         no anxiety, + depression, no nervousness, no unusual recent stress              Eyes:                           no blurry vision, + floaters, no recent vision changes, + wears glasses or contacts             ENT:                            no hearing loss, no loose or painful teeth, no dentures, last saw dentist within the past  year             Hematologic:               no easy bruising, no abnormal bleeding, no clotting disorder, no frequent epistaxis             Endocrine:                   + diabetes, does check CBG's at home                                                       Physical Exam:              BP 112/69   Pulse 74   Resp 20   Ht 5' 9"  (1.753 m)   Wt 192 lb (87.1 kg)   SpO2 95% Comment: RA  BMI 28.35 kg/m              General:                      Well nourished male NAD but somewhat frail-appearing             HEENT:                       Unremarkable              Neck:                           no JVD, no bruits, no adenopathy              Chest:                          clear to auscultation, symmetrical breath sounds, no wheezes, no rhonchi              CV:                              RRR, grade III/VI crescendo/decrescendo murmur heard best at LSB,  no diastolic murmur             Abdomen:                    soft, non-tender, no masses              Extremities:                 warm, well-perfused, pulses diminished but palpable, no LE edema             Rectal/GU                   Deferred             Neuro:                         Grossly non-focal and symmetrical throughout             Skin:                            Clean  and dry, no rashes, no breakdown   Diagnostic Tests:  Transthoracic Echocardiography  Patient: Cody Phelps, Cody Phelps MR #: [id number] Study Date: 03/27/2018 Gender: M Age: 89 Height: 175.3 cm Weight: 95.8 kg BSA: 2.19 m^2 Pt. Status: Room:  Merriam Woods, Ravia Croitoru, MD SONOGRAPHER Marygrace Drought, RCS PERFORMING [facility],  Outpatient ORDERING Nell Range Sharl Ma, Katie  cc:  ------------------------------------------------------------------- LV EF: 45% - 50%  ------------------------------------------------------------------- Indications: Pericardial Effusion (I31.3).  ------------------------------------------------------------------- History: PMH: OSA. Dyspnea. Atrial fibrillation. PMH: Myocardial infarction. Risk factors: Hypertension. Diabetes mellitus.  ------------------------------------------------------------------- Study Conclusions  - Left ventricle: The cavity size was normal. There was moderate concentric hypertrophy. Systolic function was mildly reduced. The estimated ejection fraction was in the range of 45% to 50%. Akinesis and aneurysmal deformity of the apical myocardium; consistent with infarction in the distribution of the left anterior descending coronary artery. Doppler parameters are consistent with abnormal left ventricular relaxation (grade 1 diastolic dysfunction). Doppler parameters are consistent with elevated mean left atrial filling pressure. - Aortic valve: There was severe stenosis. Valve area (VTI): 0.89 cm^2. - Mitral valve: Moderately calcified annulus. Severely thickened, mildly calcified leaflets . The findings are consistent with mild to moderate stenosis. There was mild regurgitation. Valve area by pressure half-time: 1.82 cm^2. Valve area by continuity equation (using LVOT flow): 2.46 cm^2. - Left atrium: The atrium was severely dilated. - Pulmonary arteries: Systolic pressure was mildly increased. PA peak pressure: 34 mm Hg (S). - Pericardium, extracardiac: A small pericardial effusion was identified circumferential to the heart. There was no evidence of hemodynamic compromise.  Impressions:  - The previously described pericardial effusion is smaller. Aortic valve  gradients are higher, possibly due to higher cardiac output. Direct comparison with previous images shows no change in wall motion, consistent with transmural infarction in the distal LAD artery teritory. Mitral stenosis parameters are consistent with previous measurements, but signify mild-moderate mitral stenosis.  ------------------------------------------------------------------- Study data: Comparison was made to the study of 03/09/2018. Study status: Routine. Procedure: The patient reported no pain pre or post test. Transthoracic echocardiography. Image quality was adequate. Transthoracic echocardiography. M-mode, complete 2D, spectral Doppler, and color Doppler. Birthdate: Patient birthdate: 1940-08-09. Age: Patient is 77 yr old. Sex: Gender: male. BMI: 31.2 kg/m^2. Blood pressure: 125/69 Patient status: Outpatient. Study date: Study date: 03/27/2018. Study time: 01:45 PM. Location: [facility]  -------------------------------------------------------------------  ------------------------------------------------------------------- Left ventricle: The cavity size was normal. There was moderate concentric hypertrophy. Systolic function was mildly reduced. The estimated ejection fraction was in the range of 45% to 50%. Regional wall motion abnormalities: Akinesis and aneurysmal deformity of the apical myocardium; consistent with infarction in the distribution of the left anterior descending coronary artery. Doppler parameters are consistent with abnormal left ventricular relaxation (grade 1 diastolic dysfunction). Doppler parameters are consistent with elevated mean left atrial filling pressure.  ------------------------------------------------------------------- Aortic valve: Probably trileaflet; moderately thickened, severely calcified leaflets. Doppler: There was severe stenosis. VTI ratio of LVOT to aortic  valve: 0.28. Valve area (VTI): 0.89 cm^2. Indexed valve area (VTI): 0.41 cm^2/m^2. Peak velocity ratio of LVOT to aortic valve: 0.27. Valve area (Vmax): 0.84 cm^2. Indexed valve area (Vmax): 0.39 cm^2/m^2. Mean velocity ratio of LVOT to aortic valve: 0.27. Valve area (Vmean): 0.86 cm^2. Indexed valve area (Vmean): 0.39 cm^2/m^2. Mean gradient (S): 49 mm Hg. Peak gradient (S): 86 mm Hg.  ------------------------------------------------------------------- Aorta: Aortic root: The aortic root was normal in size. Ascending aorta: The ascending aorta was normal in size.  ------------------------------------------------------------------- Mitral valve:  Moderately calcified annulus. Severely thickened, mildly calcified leaflets . Gradients measured at 70 bpm. Gradients at 70 bpm. Doppler: The findings are consistent with mild to moderate stenosis. There was mild regurgitation. Valve area by pressure half-time: 1.82 cm^2. Indexed valve area by pressure half-time: 0.83 cm^2/m^2. Valve area by continuity equation (using LVOT flow): 2.46 cm^2. Indexed valve area by continuity equation (using LVOT flow): 1.12 cm^2/m^2. Mean gradient (D): 6 mm Hg. Peak gradient (D): 9 mm Hg.  ------------------------------------------------------------------- Left atrium: The atrium was severely dilated.  ------------------------------------------------------------------- Right ventricle: The cavity size was normal. Systolic function was normal.  ------------------------------------------------------------------- Pulmonic valve: Poorly visualized. The valve appears to be grossly normal. Doppler: Transvalvular velocity was within the normal range. There was no evidence for stenosis. There was no significant regurgitation.  ------------------------------------------------------------------- Tricuspid valve: Structurally normal valve. Leaflet separation was normal. Doppler:  Transvalvular velocity was within the normal range. There was mild regurgitation.  ------------------------------------------------------------------- Pulmonary artery: Systolic pressure was mildly increased.  ------------------------------------------------------------------- Right atrium: The atrium was normal in size.  ------------------------------------------------------------------- Pericardium: A small pericardial effusion was identified circumferential to the heart. There was no evidence of hemodynamic compromise.  ------------------------------------------------------------------- Systemic veins: Inferior vena cava: The vessel was normal in size. The respirophasic diameter changes were in the normal range (= 50%), consistent with normal central venous pressure. Diameter: 18 mm.  ------------------------------------------------------------------- Measurements  IVC Value Reference ID 18 mm ----------  Left ventricle Value Reference LV ID, ED, PLAX chordal (L) 37.6 mm 43 - 52 LV ID, ES, PLAX chordal (L) 22.9 mm 23 - 38 LV fx shortening, PLAX chordal 39 % >=29 LV PW thickness, ED 16.5 mm ---------- IVS/LV PW ratio, ED 1 <=1.3 Stroke volume, 2D 100 ml ---------- Stroke volume/bsa, 2D 46 ml/m^2 ---------- LV filling time, D, DP 350 ms ---------- LV e&', lateral 4.03 cm/s ---------- LV E/e&', lateral 37.82 ---------- LV e&', medial 5.44 cm/s ---------- LV E/e&', medial 28.02 ---------- LV e&',  average 4.74 cm/s ---------- LV E/e&', average 32.19 ---------- Longitudinal strain, TDI 10 % ----------  Ventricular septum Value Reference IVS thickness, ED 16.5 mm ----------  LVOT Value Reference LVOT ID, S 20 mm ---------- LVOT area 3.14 cm^2 ---------- LVOT peak velocity, S 125 cm/s ---------- LVOT mean velocity, S 89.6 cm/s ---------- LVOT VTI, S 31.9 cm ---------- LVOT peak gradient, S 6 mm Hg ----------  Aortic valve Value Reference Aortic valve peak velocity, S 465 cm/s ---------- Aortic valve mean velocity, S 328 cm/s ---------- Aortic valve VTI, S 113 cm ---------- Aortic mean gradient, S 49 mm Hg ---------- Aortic peak gradient, S 86 mm Hg ---------- VTI ratio, LVOT/AV 0.28 ---------- Aortic valve area, VTI 0.89 cm^2 ---------- Aortic valve area/bsa, VTI 0.41 cm^2/m^2 ---------- Velocity ratio, peak, LVOT/AV 0.27 ---------- Aortic valve area, peak velocity 0.84 cm^2 ---------- Aortic valve area/bsa, peak 0.39 cm^2/m^2 ---------- velocity Velocity ratio, mean, LVOT/AV 0.27 ---------- Aortic valve area, mean velocity 0.86 cm^2 ---------- Aortic valve area/bsa, mean 0.39 cm^2/m^2 ---------- velocity Aortic regurg pressure half-time 416 ms  ----------  Aorta Value Reference Aortic root ID, ED 31 mm ----------  Left atrium Value Reference LA ID, A-P, ES 54 mm ---------- LA ID/bsa, A-P (H) 2.47 cm/m^2 <=2.2 LA volume, S 118 ml ---------- LA volume/bsa, S 54 ml/m^2 ---------- LA volume, ES, 1-p A4C 111 ml ---------- LA volume/bsa, ES, 1-p A4C 50.8 ml/m^2 ---------- LA volume, ES, 1-p A2C 114 ml ---------- LA volume/bsa, ES, 1-p A2C 52.1 ml/m^2 ----------  Mitral valve Value Reference Mitral E-wave peak  velocity 152.41 cm/s ---------- Mitral A-wave peak velocity 141 cm/s ---------- Mitral mean velocity, D 116.83 cm/s ---------- Mitral deceleration slope 366.52 cm/s^2 ---------- Mitral deceleration time (H) 416 ms 150 - 230 Mitral pressure half-time 121 ms ---------- Mitral mean gradient, D 6 mm Hg ---------- Mitral peak gradient, D 9 mm Hg ---------- Mitral E/A ratio, peak 1.08 ---------- Mitral valve area, PHT, DP 1.82 cm^2 ---------- Mitral valve area/bsa, PHT, DP 0.83 cm^2/m^2 ---------- Mitral valve area, LVOT 2.46 cm^2 ---------- continuity Mitral valve area/bsa, LVOT 1.12 cm^2/m^2 ---------- continuity Mitral annulus VTI, D 50.1 cm ----------  Pulmonary arteries Value Reference PA pressure, S, DP (H)  34 mm Hg <=30  Tricuspid valve Value Reference Tricuspid regurg peak velocity 278 cm/s ---------- Tricuspid peak RV-RA gradient 31 mm Hg ---------- Tricuspid maximal regurg 278 cm/s ---------- velocity, PISA  Right atrium Value Reference RA ID, S-I, ES, A4C (H) 55.5 mm 34 - 49 RA area, ES, A4C (H) 20.2 cm^2 8.3 - 19.5 RA volume, ES, A/L 61.9 ml ---------- RA volume/bsa, ES, A/L 28.3 ml/m^2 ----------  Systemic veins Value Reference Estimated CVP 3 mm Hg ----------  Right ventricle Value Reference TAPSE 22 mm ---------- RV pressure, S, DP (H) 34 mm Hg <=30 RV s&', lateral, S 10 cm/s ----------  Legend: (L) and (H) mark values outside specified reference range.  ------------------------------------------------------------------- Prepared and Electronically Authenticated by  Sanda Klein, MD 2019-08-27T16:10:18   Coronary/Graft Acute MI Revascularization  CORONARY BALLOON ANGIOPLASTY  LEFT HEART CATH AND CORONARY ANGIOGRAPHY  Conclusion   Coronary angiogram 03/20/2018: Left mainmildly calcified but normal, LAD large caliber vessel, mild to moderate diffuse disease followed by occlusion in the mid to distal segment, distally the LAD appears to be diffusely diseased with TIMI I flow. 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 D1 is small, D-2 is moderate to large sized with tandem 80 to 90% stenosis, mild to  moderate calcification.  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 RCA small, non-dominant and diffusely diseased without high-grade stenosis. Severe aortic stenosis with peak to peak gradient of 69 and mean gradient of 53 mmHg. Severely calcified mitral annulus and moderately calcified aortic valve.  Recommend uninterrupted dual antiplatelet therapy with Aspirin 39m daily and Ticagrelor 989mtwice dailyfor a minimum of 1 month (bare metal stent - Class I recommendation).This is being done for STEMI with completed anterior wall infarct until he recovers and will be considered for either TAVR or CABG, high-grade diagonal 2 disease with aortic valve replacement.  He will need to be evaluated for possible pneumonia with pleuritic chest pain component along with a temperature of 101 F. 185 ml contrast utilized.  Indications   Acute MI anterior wall first episode care ([facility][I21.09 (ICD-10-CM)]  Procedural Details/Technique   Technical Details Procedure performed:  Emergent left heart catheterization including hemodynamic monitoring of the left ventricle, LV gram. Selective right and left coronary arteriography. PTCA and attempted PCI of the mid LAD aborted due to subacute thrombotic occlusion and distal guide wire position to be true luminal could not be confirmed.  Indication: JoEZRAEL SAMs a [age].o. male with history of hypertension, hyperlipidemia, Diabetes Mellitus, moderate AS who presents with chest pain that started yesterday 9 am. He also had cough and fever of 101 last night. EKG revealed new anterior STEMI with Q waves, due to ongoing CP  patient taken to the Cath Lab on emergent basis.  Technique of diagnostic cardiac catheterization: Under sterile precautions using a 6 French right radial arterial access, a 6 French sheath was introduced into the right radial artery. A 5 [place] Tig 4 catheter was advanced into the ascending  aorta selective left coronary artery was cannulated and angiography was performed in multiple views. The catheter was pulled back Out of the body over exchange length J-wire. PCI attempted as below prior to engaging RCA. JR 4 Catheter was used to perform LV gram which was performed in LAO and RAO projection. Catheter exchanged out of the body over J-Wire. NO immediate complications noted. Hemostasis achieved with TR band. Patient tolerated the procedure well.   Technique of intervention:  Using a 6 [place] XB 3.5 guide catheter theLM coronary was selected and cannulated. Using Heparin for anticoagulation, I utilized a Couger XTguidewire and across the LAD coronary artery with balloon support. I placed the tip of the wire into the distal coronary artery. Angiography was performed.   Then I utilized a 2.5 x 15 mm Sapphire balloon , I performed balloon angioplasty at 3 atmospheric pressure x 2 for 45-50 seconds each. As there was no improvement in flow, and the distal guidewire luminal position could not be definitively confirmed, as patient had subacute presentation and Q waves in the anterior leads it was felt that medical therapy would be optimal. LV gram did not reveal wall motion abnormality and appeared to be hyperdynamic. Patient also has severe aortic stenosis with a mean gradient of 53 mmHg and will eventually need to be evaluated for aortic valve replacement. He will also be treated for possible pneumonia with pleuritic chest pain as well as fever of 101 F.   Estimated blood loss <50 mL.  During this procedure the patient was administered the following to achieve and maintain moderate conscious sedation: Versed 2 mg, Dilaudid 0.5 mg, while the patient's heart rate, blood pressure, and oxygen saturation were continuously monitored. The period of conscious sedation was 39 minutes, of which I was present face-to-face 100% of this time.  Coronary Findings   Diagnostic  Dominance: Left  Left  Anterior Descending  Mid LAD lesion 100% stenosed  Mid LAD lesion is 100% stenosed. Vessel is the culprit lesion. The lesion is type C.  Mid LAD to Dist LAD lesion 90% stenosed  Mid LAD to Dist LAD lesion is 90% stenosed.  Second Diagonal [employer] 2nd Diag lesion 80% stenosed  Ost 2nd Diag lesion is 80% stenosed.  2nd Diag lesion 80% stenosed  2nd Diag lesion is 80% stenosed. The lesion is calcified.  Left Circumflex  The vessel exhibits minimal luminal irregularities.  First Obtuse Marginal Branch  The vessel exhibits minimal luminal irregularities.  Right Coronary Artery  There is mild diffuse disease throughout the vessel.  Intervention   Mid LAD lesion  Angioplasty  Lesion length: 20 mm. CATH VISTA GUIDE 6FR XBLAD3.5 guide catheter was inserted. WIRE COUGAR XT STRL 190CM guidewire used to cross lesion. Balloon angioplasty was performed using a BALLOON SAPPHIRE 2.0X15. Maximum pressure: 3 atm. Inflation time: 50 sec. Lesion appeared to be subacute and heavy thrombus and distal LAD appeared to be diffusely diseased. Not sure if the wire in true lumen distally. Also Q in anterior leads already and chest pain improving.  Post-Intervention Lesion Assessment  The intervention was unsuccessful due to lesion rigidity. Pre-interventional TIMI flow is 1. Post-intervention TIMI flow is 1. No complications occurred at this lesion.  There is a 100% residual stenosis post intervention.  Left Heart   Mitral Valve The annulus is calcified.  Aorta Aortic Root: There is a significant gradient across the aortic valve.  Coronary Diagrams   Diagnostic Diagram       Post-Intervention Diagram       Implants       No implant documentation for this case.  MERGE Images   Show images for CARDIAC CATHETERIZATION   Link to Procedure Log   Procedure Log    Hemo Data    Most Recent Value  Aortic Mean Gradient 53.63 mmHg  Aortic Peak Gradient 69 mmHg  AO Systolic Pressure 97  mmHg  AO Diastolic Pressure 54 mmHg  AO Mean 72 mmHg  LV Systolic Pressure 657 mmHg  LV Diastolic Pressure 19 mmHg  LV EDP 31 mmHg  AOp Systolic Pressure 846 mmHg  AOp Diastolic Pressure 61 mmHg  AOp Mean Pressure 82 mmHg  LVp Systolic Pressure 962 mmHg  LVp Diastolic Pressure 14 mmHg  LVp EDP Pressure 32 mmHg     Cardiac TAVR CT  TECHNIQUE: The patient was scanned on a [employer]. A 120 kV retrospective scan was triggered in the descending thoracic aorta at 111 HU's. Gantry rotation speed was 250 msecs and collimation was .6 mm. No beta blockade or nitro were given. The 3D data set was reconstructed in 5% intervals of the R-R cycle. Systolic and diastolic phases were analyzed on a dedicated work station using MPR, MIP and VRT modes. The patient received 80 cc of contrast.  FINDINGS: Aortic Valve: Trileaflet aortic valve with severely thickened and calcified leaflets and severe asymmetric calcification extending into the LVOT from under left coronary cusp and continues into mitral annular calcifications.  Aorta: Normal size, minimal calcifications and atheroma, no dissection.  Sinotubular Junction: 26 x 26 mm  Ascending Thoracic Aorta: 30 x 28 mm  Aortic Arch: 25 x 22 mm  Descending Thoracic Aorta: 23 x 22 mm  Sinus of Valsalva Measurements:  Non-coronary: 31 mm  Right -coronary: 31 mm  Left -coronary: 30 mm  Coronary Artery Height above Annulus:  Left Main: 14 mm  Right Coronary: 17 mm  Virtual Basal Annulus Measurements:  Maximum/Minimum Diameter: 27.3 x 21.5 mm  Mean Diameter: 24.2 mm  Perimeter: 79.0 mm  Area: 460 mm2  Optimum Fluoroscopic Angle for Delivery: LAO 0 CAU 0.  IMPRESSION: 1. Trileaflet aortic valve with severely thickened and calcified leaflets and severe asymmetric calcification extending into the LVOT from under left coronary cusp and continues into mitral annular calcifications. Annular  measurements suitable for delivery of a 26 mm Edwards-SAPIEN 3 valve.  2. Sufficient coronary to annulus distance.  3. Optimum Fluoroscopic Angle for Delivery: LAO 0 CAU 0.  4. No thrombus in the left atrial appendage.  Electronically Signed: By: Ena Dawley On: 03/17/2018 15:05   CT ANGIOGRAPHY CHEST, ABDOMEN AND PELVIS  TECHNIQUE: Multidetector CT imaging through the chest, abdomen and pelvis was performed using the standard protocol during bolus administration of intravenous contrast. Multiplanar reconstructed images and MIPs were obtained and reviewed to evaluate the vascular anatomy.  CONTRAST: 193m ISOVUE-370 IOPAMIDOL (ISOVUE-370) INJECTION 76%  COMPARISON: None.  FINDINGS: CTA CHEST FINDINGS  Cardiovascular: Heart size is normal. Moderate to large volume of pericardial fluid with pericardial hyperenhancement. There is aortic atherosclerosis, as well as atherosclerosis of the great vessels of the mediastinum and the coronary arteries, including calcified atherosclerotic plaque in the left main, left anterior descending, left circumflex and  right coronary arteries. Myocardial thinning and hypoenhancement in the apex of the left ventricle where there is focal aneurysmal dilatation of the myocardium, indicative of prior LAD territory myocardial infarction. Severe thickening calcification of the aortic valve. Severe calcifications of the mitral annulus.  Mediastinum/Lymph Nodes: No pathologically enlarged mediastinal or hilar lymph nodes. Esophagus is unremarkable in appearance. No axillary lymphadenopathy.  Lungs/Pleura: Moderate bilateral pleural effusions lying dependently. This is associated with areas of passive atelectasis in the dependent portions of the lower lobes of the lungs bilaterally. No consolidative airspace disease. No suspicious appearing pulmonary nodules or masses are noted.  Musculoskeletal/Soft Tissues: There are no  aggressive appearing lytic or blastic lesions noted in the visualized portions of the skeleton.  CTA ABDOMEN AND PELVIS FINDINGS  Hepatobiliary: No suspicious cystic or solid hepatic lesions. No intra or extrahepatic biliary ductal dilatation. Gallbladder is normal in appearance.  Pancreas: No pancreatic mass. No pancreatic ductal dilatation. No pancreatic or peripancreatic fluid or inflammatory changes.  Spleen: Unremarkable.  Adrenals/Urinary Tract: Bilateral kidneys and adrenal glands are normal in appearance. No hydroureteronephrosis. Urinary bladder is normal in appearance.  Stomach/Bowel: Normal appearance of the stomach. No pathologic dilatation of small bowel or colon. A few scattered colonic diverticulae are noted, particularly in the sigmoid colon, without surrounding inflammatory changes to suggest an acute diverticulitis at this time.  Vascular/Lymphatic: Vascular findings and measurements pertinent to potential TAVR procedure, as detailed below. Aortic atherosclerosis, without evidence of aneurysm or dissection in the abdominal or pelvic vasculature. No lymphadenopathy noted in the abdomen or pelvis.  Reproductive: Brachytherapy implants throughout the prostate gland. Seminal vesicles are unremarkable in appearance.  Other: Trace amount of presacral edema, likely related to prior radiation therapy. No significant volume of ascites. No pneumoperitoneum.  Musculoskeletal: Status post right hip arthroplasty. There are no aggressive appearing lytic or blastic lesions noted in the visualized portions of the skeleton.  VASCULAR MEASUREMENTS PERTINENT TO TAVR:  AORTA:  Minimal Aortic Diameter - 15 x 15 mm  Severity of Aortic Calcification-mild  RIGHT PELVIS:  Right Common Iliac Artery -  Minimal Diameter-9.6 x 10.0 mm  Tortuosity-mild  Calcification-none  Right External Iliac Artery -  Minimal Diameter-8.0 x 7.9  mm  Tortuosity-moderate  Calcification - none  Right Common Femoral Artery -  Minimal Diameter-8.8 x 8.8 mm  Tortuosity-mild  Calcification - none  LEFT PELVIS:  Left Common Iliac Artery -  Minimal Diameter-10.4 x 10.0 mm  Tortuosity-moderate  Calcification - none  Left External Iliac Artery -  Minimal Diameter-8.8 x 8.2 mm  Tortuosity-moderate  Calcification - none  Left Common Femoral Artery -  Minimal Diameter-8.7 x 8.3 mm  Tortuosity-mild  Calcification - none  Review of the MIP images confirms the above findings.  IMPRESSION: 1. Vascular findings and measurements pertinent to potential TAVR procedure, as detailed above. 2. Severe thickening calcifications of the aortic valve, compatible with the reported clinical history of severe aortic stenosis. 3. Aortic atherosclerosis, in addition to left main and 3 vessel coronary artery disease. 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 Importantly, there is also a moderate to large volume of pericardial fluid and pericardial enhancement, which could indicate post infarct pericarditis. 4. Moderate bilateral pleural effusions lying dependently with extensive passive atelectasis in the dependent portions of the lower lobes of the lungs bilaterally. 5. Severe calcifications of the mitral annulus also noted. 6. Mild colonic diverticulosis without evidence to suggest an acute diverticulitis  at this time. 7. Additional incidental findings, as above.   Electronically Signed By: Vinnie Langton M.D. On: 03/19/2018 09:54    STS Risk Calculator  Procedure: AVR + CAB CALCULATE   Risk of Mortality:  6.578% Renal Failure:  9.410% Permanent Stroke:  3.372% Prolonged Ventilation:  23.021% DSW Infection:  0.530% Reoperation:  6.602% Morbidity or Mortality:   27.653% Short Length of Stay:  8.492% Long Length of Stay:  28.550%    Impression:  Patient has stage D severe symptomatic aortic stenosis and multivessel coronary artery disease. He presented acutely 1 month ago with anacute anterior wall myocardial infarction.He was found to have multivessel coronary artery disease and underwent attempted PCI and stenting of the culprit left anterior descending coronary artery. However, due to delayed presentation revascularization was not feasible. The patient subsequent convalescence has been slow and complicated by the development of new onset atrial fibrillation and a moderate sized pericardial effusion likely related to pericarditis and hemopericardium in the setting of recent transmural myocardial infarction and systemic anticoagulation. 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   Patient appears to have made excellent progress over the past month with improved strength and decreased symptoms of exertional shortness of breath.  He now gets short of breath only with moderate level activity, consistent with New York Heart Association functional class II congestive heart failure.  Overall he appears much improved.  I have personally reviewed the patient's several recent transthoracic echocardiograms, diagnostic cardiac catheterization, and CT angiograms. Echocardiograms reveal moderate global left ventricular systolic dysfunction with ejection fraction estimated 40 to 45%. There is severe akinesis of the distal anterior wall and apex consistent with transmural myocardial infarction in the left anterior descending coronary artery territory. The patient also has a moderate sized pericardial effusion which has decreased in size over the past 2 weeks but still persists at this time. The patient has severe aortic stenosis. There is severe calcification,  fibrosis, and restricted leaflet mobility involvingall 3 leaflets of the aortic valve. On recent echocardiogram peak velocity across theaortic valve measured 4.6 m/s corresponding to mean transvalvular gradient estimated 49 mmHg. Diagnostic cardiac catheterization reveals severe multivessel coronary artery disease. There is subtotal occlusion of the distal left anterior descending coronary artery and attempts at PCI were unsuccessful. There is fairly high-grade 80% proximal stenosis of a diagonal branch. There is left dominant coronary circulation. There does not appear to be significant disease in the left circumflex system although there may be some proximal disease in the small sized posterior descending coronary artery which is a terminal branch of the left circumflex. The right coronary is small and nondominant.  I agree the patient would benefit from aortic valve replacement. Risks associated with conventional surgery would be relatively highand possibly higher than that predicted using the STS risk calculator because of the patient's recent myocardial infarction, likely pericarditis and pericardial effusion, and numerous comorbid medical problems including physical deconditioning with unstable gait and mild cognitive dysfunction with possible early symptoms of Alzheimer's type dementia.  However, he seems to have made considerable progress over the past month.  CT angiography reveals adequate femoral vascular access for transcatheter aortic valve replacement via percutaneous transfemoral approach. Cardiac gated CT angiogram of the heart confirms the presence of anatomical findings consistent with severe aortic stenosis. The patient has significant calcification in the aortic annulus including a fairly bulky area of calcification that extends into the left ventricular outflow tract which  would considerably increase risk of development of perivalvular leak and potentially could be associated  with increased risk of annular rupture and transcatheter aortic valve replacement   Plan:  The patientand his wifewere again counseled at length regarding treatment alternatives for management of severe symptomatic aortic stenosis. Alternative approaches such as conventional aortic valve replacement, transcatheter aortic valve replacement, and continued medical therapy without intervention were compared and contrasted at length. The risks associated with conventional surgical aortic valve replacement were discussed in detail, as were expectations for post-operative convalescence. The potential advantages of performing concomitant coronary artery bypass grafting with or without clipping of left atrial appendage and/or Maze procedure were discussed, as well as the benefits from draining what's left of his pericardial effusion. Issues specific to transcatheter aortic valve replacement were discussed including questions about long term valve durability, the potential for paravalvular leak, possible increased risk of need for permanent pacemaker placement, risk of stroke, and other technical complications related to the procedure itself.Concerns regarding the presence of significant calcification in the aortic root were discussed. Long-term prognosis with medical therapy was discussed. This discussion was placed in the context of the patient's own specific clinical presentation and past medical history. All of their questions have been addressed.   The patient desires to proceed with conventional surgical aortic valve replacement with possible coronary artery bypass grafting and Maze procedure at some point in the near future.   Discussion was held comparing the relative risks of mechanical valve replacement with need for lifelong anticoagulation versus use of a bioprosthetic tissue valve and the associated potential for late structural valve deterioration and failure.  This discussion was placed in  the context of the patient's particular circumstances, and as a result the patient specifically requests that their valve be replaced using a bioprosthetic tissue valve.  Both the patient and his wife understand and accept all potential risks of surgery including but not limited to risk of death, stroke or other neurologic complication, myocardial infarction, congestive heart failure, respiratory failure, renal failure, bleeding requiring transfusion and/or reexploration, arrhythmia, infection or other wound complications, pneumonia, pleural and/or pericardial effusion, pulmonary embolus, aortic dissection or other major vascular complication, or delayed complications related to valve repair or replacement including but not limited to structural valve deterioration and failure, thrombosis, embolization, endocarditis, or paravalvular leak, late recurrence of ischemic heart disease.  The relative risks and benefits of performing a maze procedure at the time of their surgery was discussed at length, including the expected likelihood of long term freedom from recurrent symptomatic atrial fibrillation and/or atrial flutter.  The patient additionally provides consent for long term follow up following surgery including participation in the [place].  They understand the high likelihood that the patient will need blood transfusion during or following his surgery because of his underlying baseline anemia.  They understand the possibility that coronary artery bypass grafting may not be feasible due to the size and/or disease involving the distal target vessels as well as the presence of scar tissue related to recent pericarditis.  They also understand and accept concerns regarding the patient's degree of physical deconditioning and the potential that his physical recovery might be somewhat slow and prolonged.  All of their questions have been answered.        Valentina Gu. Roxy Manns, MD 05/29/2018 4:02  PM

## 2018-05-29 NOTE — Anesthesia Preprocedure Evaluation (Addendum)
Anesthesia Evaluation  Patient identified by MRN, date of birth, ID band Patient awake    Reviewed: Allergy & Precautions, NPO status , Patient's Chart, lab work & pertinent test results  History of Anesthesia Complications Negative for: history of anesthetic complications  Airway Mallampati: I  TM Distance: >3 FB Neck ROM: Full    Dental  (+) Teeth Intact, Partial Lower, Chipped, Dental Advisory Given,    Pulmonary sleep apnea and Continuous Positive Airway Pressure Ventilation , former smoker,    breath sounds clear to auscultation       Cardiovascular hypertension, Pt. on medications and Pt. on home beta blockers + CAD and + Past MI  + dysrhythmias Atrial Fibrillation + Valvular Problems/Murmurs AS  Rhythm:Regular Rate:Normal - Systolic murmurs Coronary angiogram 03/20/2018: Left main mildly calcified but normal, LAD large caliber vessel, mild to moderate diffuse disease followed by occlusion in the mid to distal segment, distally the LAD appears to be diffusely diseased with TIMI I flow.  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  D1 is small, D-2 is moderate to  large sized with tandem 80 to 90% stenosis, mild to moderate calcification.  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 RCA small, non-dominant and diffusely diseased without high-grade stenosis. Severe aortic stenosis with peak to peak gradient of 69 and mean gradient of 53 mmHg.  Severely calcified mitral annulus and moderately calcified aortic valve.  Recommend uninterrupted dual antiplatelet therapy with Aspirin 81mg  daily and Ticagrelor 90mg  twice daily for a minimum of 1 month (bare metal stent - Class I recommendation).  This is being done for STEMI with completed anterior wall infarct until he recovers  and will be considered for either TAVR or CABG, high-grade diagonal 2 disease with aortic valve replacement.   Study Conclusions  - Left ventricle: The cavity size was normal. There was moderate   concentric hypertrophy. Systolic function was mildly reduced. The   estimated ejection fraction was in the range of 45% to 50%.   Akinesis and aneurysmal deformity of the apical myocardium;   consistent with infarction in the distribution of the left   anterior descending coronary artery. Doppler parameters are   consistent with abnormal left ventricular relaxation (grade 1   diastolic dysfunction). Doppler parameters are consistent with   elevated mean left atrial filling pressure. - Aortic valve: There was severe stenosis. Valve area (VTI): 0.89   cm^2. - Mitral valve: Moderately calcified annulus. Severely thickened,   mildly calcified leaflets . The findings are consistent with mild   to moderate stenosis. There was mild regurgitation. Valve area by   pressure half-time: 1.82 cm^2. Valve area by continuity equation   (using LVOT flow): 2.46 cm^2. - Left atrium: The atrium was severely dilated. - Pulmonary arteries: Systolic pressure was mildly increased. PA   peak pressure: 34 mm Hg (S). - Pericardium, extracardiac: A small pericardial effusion was   identified circumferential to the heart. There was no evidence of   hemodynamic compromise.  Impressions:  - The previously described pericardial effusion is smaller. Aortic   valve gradients are higher, possibly due to higher cardiac   output. Direct comparison with previous images shows no change in   wall motion, consistent with transmural infarction in the distal   LAD artery teritory. Mitral stenosis parameters are consistent   with previous measurements, but signify  mild-moderate mitral   stenosis.    Neuro/Psych Dementia negative neurological ROS     GI/Hepatic Neg liver ROS, GERD  Medicated and Controlled,   Endo/Other  diabetes, Insulin Dependent  Renal/GU negative Renal ROSProstate Ca     Musculoskeletal  (+) Arthritis , Osteoarthritis,  Scoliosis RLS (restless legs syndrome)   Abdominal (+) - obese,   Peds  Hematology  (+) anemia , HLD   Anesthesia Other Findings   Reproductive/Obstetrics                       Anesthesia Physical  Anesthesia Plan  ASA: IV  Anesthesia Plan: General   Post-op Pain Management:    Induction: Intravenous  PONV Risk Score and Plan: 3 and Ondansetron, Treatment may vary due to age or medical condition and Dexamethasone  Airway Management Planned: Oral ETT  Additional Equipment: Arterial line, PA Cath, 3D TEE and Ultrasound Guidance Line Placement  Intra-op Plan:   Post-operative Plan: Post-operative intubation/ventilation  Informed Consent: I have reviewed the patients History and Physical, chart, labs and discussed the procedure including the risks, benefits and alternatives for the proposed anesthesia with the patient or authorized representative who has indicated his/her understanding and acceptance.   Dental advisory given  Plan Discussed with: CRNA, Anesthesiologist and Surgeon  Anesthesia Plan Comments:        Anesthesia Quick Evaluation

## 2018-05-30 ENCOUNTER — Inpatient Hospital Stay (HOSPITAL_COMMUNITY): Payer: Medicare Other

## 2018-05-30 ENCOUNTER — Inpatient Hospital Stay (HOSPITAL_COMMUNITY)
Admission: RE | Admit: 2018-05-30 | Discharge: 2018-06-04 | DRG: 219 | Disposition: A | Discharge status: Home Health | Payer: Medicare Other | Source: Ambulatory Visit | Attending: Thoracic Surgery (Cardiothoracic Vascular Surgery) | Admitting: Thoracic Surgery (Cardiothoracic Vascular Surgery)

## 2018-05-30 ENCOUNTER — Encounter (HOSPITAL_COMMUNITY): Payer: Self-pay

## 2018-05-30 ENCOUNTER — Ambulatory Visit (HOSPITAL_COMMUNITY): Payer: Medicare Other

## 2018-05-30 ENCOUNTER — Inpatient Hospital Stay (HOSPITAL_COMMUNITY): Payer: Medicare Other | Admitting: Anesthesiology

## 2018-05-30 ENCOUNTER — Inpatient Hospital Stay (HOSPITAL_COMMUNITY): Payer: Medicare Other | Admitting: Vascular Surgery

## 2018-05-30 ENCOUNTER — Encounter (HOSPITAL_COMMUNITY)
Admission: RE | Disposition: A | Discharge status: Home Health | Payer: Self-pay | Source: Ambulatory Visit | Attending: Thoracic Surgery (Cardiothoracic Vascular Surgery)

## 2018-05-30 DIAGNOSIS — Z7901 Long term (current) use of anticoagulants: Secondary | ICD-10-CM

## 2018-05-30 DIAGNOSIS — Z96641 Presence of right artificial hip joint: Secondary | ICD-10-CM | POA: Diagnosis present

## 2018-05-30 DIAGNOSIS — D62 Acute posthemorrhagic anemia: Secondary | ICD-10-CM | POA: Diagnosis not present

## 2018-05-30 DIAGNOSIS — I480 Paroxysmal atrial fibrillation: Secondary | ICD-10-CM | POA: Diagnosis present

## 2018-05-30 DIAGNOSIS — E782 Mixed hyperlipidemia: Secondary | ICD-10-CM | POA: Diagnosis present

## 2018-05-30 DIAGNOSIS — I252 Old myocardial infarction: Secondary | ICD-10-CM | POA: Diagnosis not present

## 2018-05-30 DIAGNOSIS — Z8546 Personal history of malignant neoplasm of prostate: Secondary | ICD-10-CM | POA: Diagnosis not present

## 2018-05-30 DIAGNOSIS — I350 Nonrheumatic aortic (valve) stenosis: Secondary | ICD-10-CM

## 2018-05-30 DIAGNOSIS — F0280 Dementia in other diseases classified elsewhere without behavioral disturbance: Secondary | ICD-10-CM | POA: Diagnosis present

## 2018-05-30 DIAGNOSIS — D6959 Other secondary thrombocytopenia: Secondary | ICD-10-CM | POA: Diagnosis not present

## 2018-05-30 DIAGNOSIS — G4733 Obstructive sleep apnea (adult) (pediatric): Secondary | ICD-10-CM | POA: Diagnosis present

## 2018-05-30 DIAGNOSIS — I5043 Acute on chronic combined systolic (congestive) and diastolic (congestive) heart failure: Secondary | ICD-10-CM | POA: Diagnosis not present

## 2018-05-30 DIAGNOSIS — Z7982 Long term (current) use of aspirin: Secondary | ICD-10-CM

## 2018-05-30 DIAGNOSIS — Z79899 Other long term (current) drug therapy: Secondary | ICD-10-CM

## 2018-05-30 DIAGNOSIS — G300 Alzheimer's disease with early onset: Secondary | ICD-10-CM | POA: Diagnosis present

## 2018-05-30 DIAGNOSIS — I2510 Atherosclerotic heart disease of native coronary artery without angina pectoris: Secondary | ICD-10-CM | POA: Diagnosis present

## 2018-05-30 DIAGNOSIS — Z9989 Dependence on other enabling machines and devices: Secondary | ICD-10-CM

## 2018-05-30 DIAGNOSIS — E119 Type 2 diabetes mellitus without complications: Secondary | ICD-10-CM | POA: Diagnosis present

## 2018-05-30 DIAGNOSIS — G2581 Restless legs syndrome: Secondary | ICD-10-CM | POA: Diagnosis present

## 2018-05-30 DIAGNOSIS — Z951 Presence of aortocoronary bypass graft: Secondary | ICD-10-CM

## 2018-05-30 DIAGNOSIS — Z006 Encounter for examination for normal comparison and control in clinical research program: Secondary | ICD-10-CM | POA: Diagnosis not present

## 2018-05-30 DIAGNOSIS — I312 Hemopericardium, not elsewhere classified: Secondary | ICD-10-CM | POA: Diagnosis present

## 2018-05-30 DIAGNOSIS — Z87891 Personal history of nicotine dependence: Secondary | ICD-10-CM | POA: Diagnosis not present

## 2018-05-30 DIAGNOSIS — I440 Atrioventricular block, first degree: Secondary | ICD-10-CM | POA: Diagnosis not present

## 2018-05-30 DIAGNOSIS — I080 Rheumatic disorders of both mitral and aortic valves: Principal | ICD-10-CM | POA: Diagnosis present

## 2018-05-30 DIAGNOSIS — E785 Hyperlipidemia, unspecified: Secondary | ICD-10-CM | POA: Diagnosis present

## 2018-05-30 DIAGNOSIS — Z794 Long term (current) use of insulin: Secondary | ICD-10-CM

## 2018-05-30 DIAGNOSIS — I110 Hypertensive heart disease with heart failure: Secondary | ICD-10-CM | POA: Diagnosis present

## 2018-05-30 DIAGNOSIS — I4892 Unspecified atrial flutter: Secondary | ICD-10-CM | POA: Diagnosis present

## 2018-05-30 DIAGNOSIS — Z8679 Personal history of other diseases of the circulatory system: Secondary | ICD-10-CM

## 2018-05-30 DIAGNOSIS — Z9889 Other specified postprocedural states: Secondary | ICD-10-CM

## 2018-05-30 DIAGNOSIS — J9811 Atelectasis: Secondary | ICD-10-CM

## 2018-05-30 DIAGNOSIS — D649 Anemia, unspecified: Secondary | ICD-10-CM | POA: Diagnosis present

## 2018-05-30 DIAGNOSIS — I482 Chronic atrial fibrillation, unspecified: Secondary | ICD-10-CM

## 2018-05-30 DIAGNOSIS — Z952 Presence of prosthetic heart valve: Secondary | ICD-10-CM

## 2018-05-30 DIAGNOSIS — I3139 Other pericardial effusion (noninflammatory): Secondary | ICD-10-CM | POA: Diagnosis present

## 2018-05-30 DIAGNOSIS — I313 Pericardial effusion (noninflammatory): Secondary | ICD-10-CM | POA: Diagnosis present

## 2018-05-30 DIAGNOSIS — K219 Gastro-esophageal reflux disease without esophagitis: Secondary | ICD-10-CM | POA: Diagnosis present

## 2018-05-30 DIAGNOSIS — I10 Essential (primary) hypertension: Secondary | ICD-10-CM | POA: Diagnosis present

## 2018-05-30 DIAGNOSIS — Z953 Presence of xenogenic heart valve: Secondary | ICD-10-CM

## 2018-05-30 HISTORY — DX: Presence of aortocoronary bypass graft: Z95.1

## 2018-05-30 HISTORY — PX: AORTIC VALVE REPLACEMENT: SHX41

## 2018-05-30 HISTORY — DX: Personal history of other diseases of the circulatory system: Z86.79

## 2018-05-30 HISTORY — DX: Presence of xenogenic heart valve: Z95.3

## 2018-05-30 HISTORY — PX: TEE WITHOUT CARDIOVERSION: SHX5443

## 2018-05-30 HISTORY — PX: MAZE: SHX5063

## 2018-05-30 HISTORY — DX: Morbid (severe) obesity due to excess calories: E66.01

## 2018-05-30 HISTORY — PX: CORONARY ARTERY BYPASS GRAFT: SHX141

## 2018-05-30 HISTORY — DX: Other specified postprocedural states: Z98.890

## 2018-05-30 LAB — POCT I-STAT 3, ART BLOOD GAS (G3+)
Acid-base deficit: 1 mmol/L (ref 0.0–2.0)
Acid-base deficit: 3 mmol/L — ABNORMAL HIGH (ref 0.0–2.0)
Acid-base deficit: 4 mmol/L — ABNORMAL HIGH (ref 0.0–2.0)
Bicarbonate: 24 mmol/L (ref 20.0–28.0)
Bicarbonate: 24.8 mmol/L (ref 20.0–28.0)
Bicarbonate: 22.7 mmol/L (ref 20.0–28.0)
Bicarbonate: 22.1 mmol/L (ref 20.0–28.0)
O2 Saturation: 100 %
O2 Saturation: 100 %
O2 Saturation: 99 %
O2 Saturation: 97 %
Patient temperature: 36.7
Patient temperature: 37.4
Patient temperature: 37.8
TCO2: 25 mmol/L (ref 22–32)
TCO2: 26 mmol/L (ref 22–32)
TCO2: 24 mmol/L (ref 22–32)
TCO2: 23 mmol/L (ref 22–32)
pCO2 arterial: 37.6 mmHg (ref 32.0–48.0)
pCO2 arterial: 38.9 mmHg (ref 32.0–48.0)
pCO2 arterial: 42.8 mmHg (ref 32.0–48.0)
pCO2 arterial: 42.7 mmHg (ref 32.0–48.0)
pH, Arterial: 7.413 (ref 7.350–7.450)
pH, Arterial: 7.412 (ref 7.350–7.450)
pH, Arterial: 7.336 — ABNORMAL LOW (ref 7.350–7.450)
pH, Arterial: 7.325 — ABNORMAL LOW (ref 7.350–7.450)
pO2, Arterial: 412 mmHg — ABNORMAL HIGH (ref 83.0–108.0)
pO2, Arterial: 209 mmHg — ABNORMAL HIGH (ref 83.0–108.0)
pO2, Arterial: 147 mmHg — ABNORMAL HIGH (ref 83.0–108.0)
pO2, Arterial: 98 mmHg (ref 83.0–108.0)

## 2018-05-30 LAB — CBC
HCT: 25.7 % — ABNORMAL LOW (ref 39.0–52.0)
HCT: 26.3 % — ABNORMAL LOW (ref 39.0–52.0)
HCT: 34.3 % — ABNORMAL LOW (ref 39.0–52.0)
Hemoglobin: 8.2 g/dL — ABNORMAL LOW (ref 13.0–17.0)
Hemoglobin: 8.8 g/dL — ABNORMAL LOW (ref 13.0–17.0)
Hemoglobin: 10.8 g/dL — ABNORMAL LOW (ref 13.0–17.0)
MCH: 28.3 pg (ref 26.0–34.0)
MCH: 29.3 pg (ref 26.0–34.0)
MCH: 27.9 pg (ref 26.0–34.0)
MCHC: 31.9 g/dL (ref 30.0–36.0)
MCHC: 33.5 g/dL (ref 30.0–36.0)
MCHC: 31.5 g/dL (ref 30.0–36.0)
MCV: 88.6 fL (ref 80.0–100.0)
MCV: 87.7 fL (ref 80.0–100.0)
MCV: 88.6 fL (ref 80.0–100.0)
Platelets: 96 10*3/uL — ABNORMAL LOW (ref 150–400)
Platelets: 104 10*3/uL — ABNORMAL LOW (ref 150–400)
Platelets: 107 10*3/uL — ABNORMAL LOW (ref 150–400)
RBC: 2.9 MIL/uL — ABNORMAL LOW (ref 4.22–5.81)
RBC: 3 MIL/uL — ABNORMAL LOW (ref 4.22–5.81)
RBC: 3.87 MIL/uL — ABNORMAL LOW (ref 4.22–5.81)
RDW: 15.1 % (ref 11.5–15.5)
RDW: 15 % (ref 11.5–15.5)
RDW: 15.6 % — ABNORMAL HIGH (ref 11.5–15.5)
WBC: 5.1 10*3/uL (ref 4.0–10.5)
WBC: 4.4 10*3/uL (ref 4.0–10.5)
WBC: 8.8 10*3/uL (ref 4.0–10.5)
nRBC: 0 % (ref 0.0–0.2)
nRBC: 0 % (ref 0.0–0.2)
nRBC: 0 % (ref 0.0–0.2)

## 2018-05-30 LAB — POCT I-STAT, CHEM 8
BUN: 13 mg/dL (ref 8–23)
BUN: 11 mg/dL (ref 8–23)
BUN: 12 mg/dL (ref 8–23)
BUN: 11 mg/dL (ref 8–23)
BUN: 12 mg/dL (ref 8–23)
BUN: 11 mg/dL (ref 8–23)
BUN: 11 mg/dL (ref 8–23)
Calcium, Ion: 1.24 mmol/L (ref 1.15–1.40)
Calcium, Ion: 1.01 mmol/L — ABNORMAL LOW (ref 1.15–1.40)
Calcium, Ion: 1.18 mmol/L (ref 1.15–1.40)
Calcium, Ion: 1.13 mmol/L — ABNORMAL LOW (ref 1.15–1.40)
Calcium, Ion: 1.23 mmol/L (ref 1.15–1.40)
Calcium, Ion: 1.2 mmol/L (ref 1.15–1.40)
Calcium, Ion: 1.17 mmol/L (ref 1.15–1.40)
Chloride: 103 mmol/L (ref 98–111)
Chloride: 100 mmol/L (ref 98–111)
Chloride: 104 mmol/L (ref 98–111)
Chloride: 103 mmol/L (ref 98–111)
Chloride: 101 mmol/L (ref 98–111)
Chloride: 106 mmol/L (ref 98–111)
Chloride: 110 mmol/L (ref 98–111)
Creatinine, Ser: 0.9 mg/dL (ref 0.61–1.24)
Creatinine, Ser: 0.7 mg/dL (ref 0.61–1.24)
Creatinine, Ser: 0.9 mg/dL (ref 0.61–1.24)
Creatinine, Ser: 0.9 mg/dL (ref 0.61–1.24)
Creatinine, Ser: 0.8 mg/dL (ref 0.61–1.24)
Creatinine, Ser: 0.8 mg/dL (ref 0.61–1.24)
Creatinine, Ser: 1 mg/dL (ref 0.61–1.24)
Glucose, Bld: 91 mg/dL (ref 70–99)
Glucose, Bld: 106 mg/dL — ABNORMAL HIGH (ref 70–99)
Glucose, Bld: 122 mg/dL — ABNORMAL HIGH (ref 70–99)
Glucose, Bld: 157 mg/dL — ABNORMAL HIGH (ref 70–99)
Glucose, Bld: 144 mg/dL — ABNORMAL HIGH (ref 70–99)
Glucose, Bld: 109 mg/dL — ABNORMAL HIGH (ref 70–99)
Glucose, Bld: 107 mg/dL — ABNORMAL HIGH (ref 70–99)
HCT: 24 % — ABNORMAL LOW (ref 39.0–52.0)
HCT: 22 % — ABNORMAL LOW (ref 39.0–52.0)
HCT: 23 % — ABNORMAL LOW (ref 39.0–52.0)
HCT: 23 % — ABNORMAL LOW (ref 39.0–52.0)
HCT: 25 % — ABNORMAL LOW (ref 39.0–52.0)
HCT: 23 % — ABNORMAL LOW (ref 39.0–52.0)
HCT: 29 % — ABNORMAL LOW (ref 39.0–52.0)
Hemoglobin: 8.2 g/dL — ABNORMAL LOW (ref 13.0–17.0)
Hemoglobin: 7.5 g/dL — ABNORMAL LOW (ref 13.0–17.0)
Hemoglobin: 7.8 g/dL — ABNORMAL LOW (ref 13.0–17.0)
Hemoglobin: 7.8 g/dL — ABNORMAL LOW (ref 13.0–17.0)
Hemoglobin: 8.5 g/dL — ABNORMAL LOW (ref 13.0–17.0)
Hemoglobin: 7.8 g/dL — ABNORMAL LOW (ref 13.0–17.0)
Hemoglobin: 9.9 g/dL — ABNORMAL LOW (ref 13.0–17.0)
Potassium: 3.1 mmol/L — ABNORMAL LOW (ref 3.5–5.1)
Potassium: 3.1 mmol/L — ABNORMAL LOW (ref 3.5–5.1)
Potassium: 3.3 mmol/L — ABNORMAL LOW (ref 3.5–5.1)
Potassium: 3.8 mmol/L (ref 3.5–5.1)
Potassium: 3.5 mmol/L (ref 3.5–5.1)
Potassium: 3.6 mmol/L (ref 3.5–5.1)
Potassium: 4.6 mmol/L (ref 3.5–5.1)
Sodium: 140 mmol/L (ref 135–145)
Sodium: 139 mmol/L (ref 135–145)
Sodium: 141 mmol/L (ref 135–145)
Sodium: 139 mmol/L (ref 135–145)
Sodium: 141 mmol/L (ref 135–145)
Sodium: 140 mmol/L (ref 135–145)
Sodium: 142 mmol/L (ref 135–145)
TCO2: 28 mmol/L (ref 22–32)
TCO2: 25 mmol/L (ref 22–32)
TCO2: 25 mmol/L (ref 22–32)
TCO2: 28 mmol/L (ref 22–32)
TCO2: 28 mmol/L (ref 22–32)
TCO2: 24 mmol/L (ref 22–32)
TCO2: 23 mmol/L (ref 22–32)

## 2018-05-30 LAB — POCT I-STAT 4, (NA,K, GLUC, HGB,HCT)
Glucose, Bld: 91 mg/dL (ref 70–99)
HCT: 25 % — ABNORMAL LOW (ref 39.0–52.0)
Hemoglobin: 8.5 g/dL — ABNORMAL LOW (ref 13.0–17.0)
Potassium: 3.6 mmol/L (ref 3.5–5.1)
Sodium: 144 mmol/L (ref 135–145)

## 2018-05-30 LAB — GLUCOSE, CAPILLARY
Glucose-Capillary: 107 mg/dL — ABNORMAL HIGH (ref 70–99)
Glucose-Capillary: 79 mg/dL (ref 70–99)
Glucose-Capillary: 82 mg/dL (ref 70–99)
Glucose-Capillary: 91 mg/dL (ref 70–99)
Glucose-Capillary: 99 mg/dL (ref 70–99)
Glucose-Capillary: 104 mg/dL — ABNORMAL HIGH (ref 70–99)
Glucose-Capillary: 113 mg/dL — ABNORMAL HIGH (ref 70–99)
Glucose-Capillary: 101 mg/dL — ABNORMAL HIGH (ref 70–99)
Glucose-Capillary: 133 mg/dL — ABNORMAL HIGH (ref 70–99)

## 2018-05-30 LAB — APTT: aPTT: 35 seconds (ref 24–36)

## 2018-05-30 LAB — CREATININE, SERUM
Creatinine, Ser: 0.9 mg/dL (ref 0.61–1.24)
GFR calc Af Amer: >60 mL/min (ref >60)
GFR calc non Af Amer: >60 mL/min (ref >60)

## 2018-05-30 LAB — HEMOGLOBIN AND HEMATOCRIT, BLOOD
HCT: 23.5 % — ABNORMAL LOW (ref 39.0–52.0)
Hemoglobin: 7.8 g/dL — ABNORMAL LOW (ref 13.0–17.0)

## 2018-05-30 LAB — PROTIME-INR
INR: 1.44
Prothrombin Time: 17.4 seconds — ABNORMAL HIGH (ref 11.4–15.2)

## 2018-05-30 LAB — PLATELET COUNT: Platelets: 116 10*3/uL — ABNORMAL LOW (ref 150–400)

## 2018-05-30 LAB — MAGNESIUM: Magnesium: 3.2 mg/dL — ABNORMAL HIGH (ref 1.7–2.4)

## 2018-05-30 LAB — PREPARE RBC (CROSSMATCH)

## 2018-05-30 SURGERY — REPLACEMENT, AORTIC VALVE, OPEN
Anesthesia: General | Site: Chest

## 2018-05-30 MED ORDER — ASPIRIN 81 MG PO CHEW: 324.0000 mg | CHEWABLE_TABLET | Freq: Every day | ORAL | Status: DC

## 2018-05-30 MED ORDER — ROCURONIUM BROMIDE 50 MG/5ML IV SOSY
PREFILLED_SYRINGE | INTRAVENOUS | Status: AC
  Filled 2018-05-30: qty 5

## 2018-05-30 MED ORDER — SODIUM CHLORIDE 0.9% FLUSH: 3.0000 mL | INTRAVENOUS | Status: DC | PRN

## 2018-05-30 MED ORDER — DOCUSATE SODIUM 100 MG PO CAPS
200.0000 mg | ORAL_CAPSULE | Freq: Every day | ORAL | Status: DC
  Administered 2018-05-31 – 2018-06-02 (×3): 200 mg via ORAL
  Filled 2018-05-30 (×3): qty 2

## 2018-05-30 MED ORDER — OXYCODONE HCL 5 MG PO TABS
5.0000 mg | ORAL_TABLET | ORAL | Status: DC | PRN
  Administered 2018-05-31 (×2): 10 mg via ORAL
  Filled 2018-05-30 (×2): qty 2

## 2018-05-30 MED ORDER — MORPHINE SULFATE (PF) 2 MG/ML IV SOLN
1.0000 mg | INTRAVENOUS | Status: DC | PRN
  Administered 2018-05-30: 1 mg via INTRAVENOUS
  Administered 2018-05-30 – 2018-05-31 (×3): 2 mg via INTRAVENOUS
  Administered 2018-06-01: 1 mg via INTRAVENOUS
  Filled 2018-05-30 (×4): qty 1

## 2018-05-30 MED ORDER — CHLORHEXIDINE GLUCONATE 0.12% ORAL RINSE (MEDLINE KIT): 15.0000 mL | Freq: Two times a day (BID) | OROMUCOSAL | Status: DC

## 2018-05-30 MED ORDER — INSULIN REGULAR(HUMAN) IN NACL 100-0.9 UT/100ML-% IV SOLN: INTRAVENOUS | Status: DC

## 2018-05-30 MED ORDER — LACTATED RINGERS IV SOLN: 500.0000 mL | Freq: Once | INTRAVENOUS | Status: DC | PRN

## 2018-05-30 MED ORDER — SODIUM CHLORIDE 0.9% FLUSH
3.0000 mL | Freq: Two times a day (BID) | INTRAVENOUS | Status: DC
  Administered 2018-05-31 – 2018-06-03 (×6): 3 mL via INTRAVENOUS

## 2018-05-30 MED ORDER — COLESEVELAM HCL 625 MG PO TABS
1875.0000 mg | ORAL_TABLET | Freq: Every day | ORAL | Status: DC
  Administered 2018-05-31 – 2018-06-04 (×5): 1875 mg via ORAL
  Filled 2018-05-30 (×5): qty 3

## 2018-05-30 MED ORDER — LACTATED RINGERS IV SOLN
INTRAVENOUS | Status: DC | PRN
  Administered 2018-05-30: 10:00:00 via INTRAVENOUS

## 2018-05-30 MED ORDER — ASPIRIN EC 325 MG PO TBEC
325.0000 mg | DELAYED_RELEASE_TABLET | Freq: Every day | ORAL | Status: DC
  Administered 2018-05-31: 325 mg via ORAL
  Filled 2018-05-30: qty 1

## 2018-05-30 MED ORDER — ORAL CARE MOUTH RINSE
15.0000 mL | Freq: Two times a day (BID) | OROMUCOSAL | Status: DC
  Administered 2018-05-31 – 2018-06-04 (×4): 15 mL via OROMUCOSAL

## 2018-05-30 MED ORDER — NITROGLYCERIN IN D5W 200-5 MCG/ML-% IV SOLN: 0.0000 ug/min | INTRAVENOUS | Status: DC

## 2018-05-30 MED ORDER — ACETAMINOPHEN 160 MG/5ML PO SOLN: 1000.0000 mg | Freq: Four times a day (QID) | ORAL | Status: DC

## 2018-05-30 MED ORDER — FAMOTIDINE IN NACL 20-0.9 MG/50ML-% IV SOLN
20.0000 mg | Freq: Two times a day (BID) | INTRAVENOUS | Status: DC
  Filled 2018-05-30 (×2): qty 50

## 2018-05-30 MED ORDER — FENTANYL CITRATE (PF) 250 MCG/5ML IJ SOLN
INTRAMUSCULAR | Status: AC
  Filled 2018-05-30: qty 20

## 2018-05-30 MED ORDER — ROCURONIUM BROMIDE 50 MG/5ML IV SOSY
PREFILLED_SYRINGE | INTRAVENOUS | Status: DC | PRN
  Administered 2018-05-30 (×3): 50 mg via INTRAVENOUS
  Administered 2018-05-30: 100 mg via INTRAVENOUS

## 2018-05-30 MED ORDER — HEPARIN SODIUM (PORCINE) 1000 UNIT/ML IJ SOLN
INTRAMUSCULAR | Status: AC
  Filled 2018-05-30: qty 1

## 2018-05-30 MED ORDER — ONDANSETRON HCL 4 MG/2ML IJ SOLN
INTRAMUSCULAR | Status: AC
  Filled 2018-05-30: qty 2

## 2018-05-30 MED ORDER — CHLORHEXIDINE GLUCONATE 0.12 % MT SOLN
15.0000 mL | OROMUCOSAL | Status: AC
  Administered 2018-05-30: 15 mL via OROMUCOSAL
  Filled 2018-05-30: qty 15

## 2018-05-30 MED ORDER — ATORVASTATIN CALCIUM 80 MG PO TABS
80.0000 mg | ORAL_TABLET | Freq: Every evening | ORAL | Status: DC
  Administered 2018-05-31 – 2018-06-03 (×4): 80 mg via ORAL
  Filled 2018-05-30 (×4): qty 1

## 2018-05-30 MED ORDER — ROPINIROLE HCL 1 MG PO TABS
2.0000 mg | ORAL_TABLET | Freq: Two times a day (BID) | ORAL | Status: DC
  Administered 2018-05-30 – 2018-05-31 (×3): 2 mg via ORAL
  Filled 2018-05-30 (×3): qty 2

## 2018-05-30 MED ORDER — SODIUM CHLORIDE 0.9 % IV SOLN: 250.0000 mL | INTRAVENOUS | Status: DC

## 2018-05-30 MED ORDER — SODIUM CHLORIDE 0.9 % IR SOLN
Status: DC | PRN
  Administered 2018-05-30: 5000 mL

## 2018-05-30 MED ORDER — PROTAMINE SULFATE 10 MG/ML IV SOLN
INTRAVENOUS | Status: AC
  Filled 2018-05-30: qty 10

## 2018-05-30 MED ORDER — BISACODYL 10 MG RE SUPP
10.0000 mg | Freq: Every day | RECTAL | Status: DC
  Filled 2018-05-30: qty 1

## 2018-05-30 MED ORDER — LACTATED RINGERS IV SOLN
INTRAVENOUS | Status: DC | PRN
  Administered 2018-05-30: 07:00:00 via INTRAVENOUS

## 2018-05-30 MED ORDER — SODIUM CHLORIDE 0.9% IV SOLUTION: Freq: Once | INTRAVENOUS | Status: DC

## 2018-05-30 MED ORDER — METOPROLOL TARTRATE 5 MG/5ML IV SOLN: 2.5000 mg | INTRAVENOUS | Status: DC | PRN

## 2018-05-30 MED ORDER — HEMOSTATIC AGENTS (NO CHARGE) OPTIME
TOPICAL | Status: DC | PRN
  Administered 2018-05-30: 3 via TOPICAL
  Administered 2018-05-30 (×2): 1 via TOPICAL

## 2018-05-30 MED ORDER — LACTATED RINGERS IV SOLN: INTRAVENOUS | Status: DC

## 2018-05-30 MED ORDER — DEXMEDETOMIDINE HCL IN NACL 200 MCG/50ML IV SOLN
INTRAVENOUS | Status: AC
  Filled 2018-05-30: qty 50

## 2018-05-30 MED ORDER — FENTANYL CITRATE (PF) 250 MCG/5ML IJ SOLN
INTRAMUSCULAR | Status: DC | PRN
  Administered 2018-05-30: 100 ug via INTRAVENOUS
  Administered 2018-05-30: 150 ug via INTRAVENOUS
  Administered 2018-05-30: 300 ug via INTRAVENOUS
  Administered 2018-05-30: 150 ug via INTRAVENOUS
  Administered 2018-05-30 (×2): 100 ug via INTRAVENOUS
  Administered 2018-05-30 (×2): 50 ug via INTRAVENOUS

## 2018-05-30 MED ORDER — MIDAZOLAM HCL 5 MG/5ML IJ SOLN
INTRAMUSCULAR | Status: DC | PRN
  Administered 2018-05-30 (×6): 2 mg via INTRAVENOUS

## 2018-05-30 MED ORDER — MIRABEGRON ER 50 MG PO TB24
50.0000 mg | ORAL_TABLET | Freq: Every day | ORAL | Status: DC
  Administered 2018-05-31 – 2018-06-04 (×5): 50 mg via ORAL
  Filled 2018-05-30 (×5): qty 1

## 2018-05-30 MED ORDER — MIDAZOLAM HCL 2 MG/2ML IJ SOLN: 2.0000 mg | INTRAMUSCULAR | Status: DC | PRN

## 2018-05-30 MED ORDER — PROTAMINE SULFATE 10 MG/ML IV SOLN
INTRAVENOUS | Status: AC
  Filled 2018-05-30: qty 25

## 2018-05-30 MED ORDER — CHLORHEXIDINE GLUCONATE 0.12 % MT SOLN
15.0000 mL | Freq: Once | OROMUCOSAL | Status: AC
  Administered 2018-05-30: 15 mL via OROMUCOSAL
  Filled 2018-05-30: qty 15

## 2018-05-30 MED ORDER — ORAL CARE MOUTH RINSE
15.0000 mL | OROMUCOSAL | Status: DC
  Administered 2018-05-30 (×2): 15 mL via OROMUCOSAL

## 2018-05-30 MED ORDER — INSULIN REGULAR BOLUS VIA INFUSION
0.0000 [IU] | Freq: Three times a day (TID) | INTRAVENOUS | Status: DC
  Filled 2018-05-30: qty 10

## 2018-05-30 MED ORDER — ACETAMINOPHEN 500 MG PO TABS
1000.0000 mg | ORAL_TABLET | Freq: Four times a day (QID) | ORAL | Status: DC
  Administered 2018-05-30 – 2018-06-04 (×15): 1000 mg via ORAL
  Filled 2018-05-30 (×16): qty 2

## 2018-05-30 MED ORDER — PROTAMINE SULFATE 10 MG/ML IV SOLN
INTRAVENOUS | Status: DC | PRN
  Administered 2018-05-30: 25 mg via INTRAVENOUS
  Administered 2018-05-30 (×3): 50 mg via INTRAVENOUS
  Administered 2018-05-30: 25 mg via INTRAVENOUS
  Administered 2018-05-30 (×2): 50 mg via INTRAVENOUS

## 2018-05-30 MED ORDER — MEMANTINE HCL 10 MG PO TABS
5.0000 mg | ORAL_TABLET | Freq: Two times a day (BID) | ORAL | Status: DC
  Administered 2018-05-31 – 2018-06-04 (×9): 5 mg via ORAL
  Filled 2018-05-30 (×10): qty 1

## 2018-05-30 MED ORDER — CHLORHEXIDINE GLUCONATE CLOTH 2 % EX PADS
6.0000 | MEDICATED_PAD | Freq: Every day | CUTANEOUS | Status: DC
  Administered 2018-05-30 – 2018-05-31 (×2): 6 via TOPICAL

## 2018-05-30 MED ORDER — PHENYLEPHRINE HCL-NACL 20-0.9 MG/250ML-% IV SOLN
0.0000 ug/min | INTRAVENOUS | Status: DC
  Administered 2018-05-31: 15 ug/min via INTRAVENOUS
  Filled 2018-05-30: qty 250

## 2018-05-30 MED ORDER — MILRINONE LACTATE IN DEXTROSE 20-5 MG/100ML-% IV SOLN
0.3000 ug/kg/min | INTRAVENOUS | Status: DC
  Administered 2018-05-30 – 2018-05-31 (×3): 0.3 ug/kg/min via INTRAVENOUS
  Filled 2018-05-30 (×3): qty 100

## 2018-05-30 MED ORDER — MUPIROCIN 2 % EX OINT
1.0000 "application " | TOPICAL_OINTMENT | Freq: Two times a day (BID) | CUTANEOUS | Status: DC
  Administered 2018-05-30 – 2018-06-03 (×9): 1 via NASAL
  Filled 2018-05-30 (×4): qty 22

## 2018-05-30 MED ORDER — ONDANSETRON HCL 4 MG/2ML IJ SOLN
4.0000 mg | Freq: Four times a day (QID) | INTRAMUSCULAR | Status: DC | PRN
  Administered 2018-05-30 – 2018-05-31 (×2): 4 mg via INTRAVENOUS
  Filled 2018-05-30 (×2): qty 2

## 2018-05-30 MED ORDER — ACETAMINOPHEN 650 MG RE SUPP
650.0000 mg | Freq: Once | RECTAL | Status: AC
  Administered 2018-05-30: 650 mg via RECTAL

## 2018-05-30 MED ORDER — SODIUM CHLORIDE 0.9% IV SOLUTION
Freq: Once | INTRAVENOUS | Status: AC
  Administered 2018-05-30: 16:00:00 via INTRAVENOUS

## 2018-05-30 MED ORDER — SODIUM CHLORIDE 0.9 % IV SOLN
INTRAVENOUS | Status: DC
  Administered 2018-05-30: 15:00:00 via INTRAVENOUS

## 2018-05-30 MED ORDER — MORPHINE SULFATE (PF) 2 MG/ML IV SOLN
1.0000 mg | INTRAVENOUS | Status: DC | PRN
  Filled 2018-05-30: qty 1

## 2018-05-30 MED ORDER — METOPROLOL TARTRATE 12.5 MG HALF TABLET: 12.5000 mg | ORAL_TABLET | Freq: Once | ORAL | Status: DC

## 2018-05-30 MED ORDER — TRAMADOL HCL 50 MG PO TABS
50.0000 mg | ORAL_TABLET | ORAL | Status: DC | PRN
  Administered 2018-06-01: 100 mg via ORAL
  Filled 2018-05-30: qty 2

## 2018-05-30 MED ORDER — VANCOMYCIN HCL IN DEXTROSE 1-5 GM/200ML-% IV SOLN
1000.0000 mg | Freq: Once | INTRAVENOUS | Status: AC
  Administered 2018-05-30: 1000 mg via INTRAVENOUS
  Filled 2018-05-30: qty 200

## 2018-05-30 MED ORDER — CHLORHEXIDINE GLUCONATE 4 % EX LIQD: 30.0000 mL | CUTANEOUS | Status: DC

## 2018-05-30 MED ORDER — MAGNESIUM SULFATE 4 GM/100ML IV SOLN
INTRAVENOUS | Status: AC
  Filled 2018-05-30: qty 100

## 2018-05-30 MED ORDER — DONEPEZIL HCL 5 MG PO TABS
10.0000 mg | ORAL_TABLET | Freq: Every morning | ORAL | Status: DC
  Administered 2018-05-31 – 2018-06-04 (×5): 10 mg via ORAL
  Filled 2018-05-30 (×2): qty 1
  Filled 2018-05-30 (×3): qty 2

## 2018-05-30 MED ORDER — BISACODYL 5 MG PO TBEC
10.0000 mg | DELAYED_RELEASE_TABLET | Freq: Every day | ORAL | Status: DC
  Administered 2018-05-31 – 2018-06-02 (×3): 10 mg via ORAL
  Filled 2018-05-30 (×3): qty 2

## 2018-05-30 MED ORDER — SODIUM CHLORIDE 0.45 % IV SOLN: INTRAVENOUS | Status: DC | PRN

## 2018-05-30 MED ORDER — PANTOPRAZOLE SODIUM 40 MG PO TBEC
40.0000 mg | DELAYED_RELEASE_TABLET | Freq: Every day | ORAL | Status: DC
  Administered 2018-06-01 – 2018-06-04 (×4): 40 mg via ORAL
  Filled 2018-05-30 (×4): qty 1

## 2018-05-30 MED ORDER — LACTATED RINGERS IV SOLN
INTRAVENOUS | Status: DC | PRN
  Administered 2018-05-30 (×2): via INTRAVENOUS

## 2018-05-30 MED ORDER — ALBUMIN HUMAN 5 % IV SOLN
250.0000 mL | INTRAVENOUS | Status: DC | PRN
  Administered 2018-05-30 (×2): 12.5 g via INTRAVENOUS
  Filled 2018-05-30 (×2): qty 250

## 2018-05-30 MED ORDER — PHENYLEPHRINE 40 MCG/ML (10ML) SYRINGE FOR IV PUSH (FOR BLOOD PRESSURE SUPPORT)
PREFILLED_SYRINGE | INTRAVENOUS | Status: DC | PRN
  Administered 2018-05-30: 80 ug via INTRAVENOUS
  Administered 2018-05-30: 160 ug via INTRAVENOUS
  Administered 2018-05-30: 40 ug via INTRAVENOUS
  Administered 2018-05-30: 80 ug via INTRAVENOUS
  Administered 2018-05-30 (×2): 40 ug via INTRAVENOUS
  Administered 2018-05-30: 80 ug via INTRAVENOUS

## 2018-05-30 MED ORDER — TAMSULOSIN HCL 0.4 MG PO CAPS
0.4000 mg | ORAL_CAPSULE | Freq: Every day | ORAL | Status: DC
Start: 2018-05-31 — End: 2018-06-04
  Administered 2018-05-31 – 2018-06-03 (×4): 0.4 mg via ORAL
  Filled 2018-05-30 (×5): qty 1

## 2018-05-30 MED ORDER — ROPINIROLE HCL 1 MG PO TABS: 2.0000 mg | ORAL_TABLET | Freq: Two times a day (BID) | ORAL | Status: DC

## 2018-05-30 MED ORDER — PROPOFOL 10 MG/ML IV BOLUS
INTRAVENOUS | Status: AC
  Filled 2018-05-30: qty 20

## 2018-05-30 MED ORDER — ACETAMINOPHEN 160 MG/5ML PO SOLN: 650.0000 mg | Freq: Once | ORAL | Status: AC

## 2018-05-30 MED ORDER — PROPOFOL 10 MG/ML IV BOLUS
INTRAVENOUS | Status: DC | PRN
  Administered 2018-05-30: 100 mg via INTRAVENOUS

## 2018-05-30 MED ORDER — SODIUM CHLORIDE 0.9 % IV SOLN: INTRAVENOUS | Status: DC

## 2018-05-30 MED ORDER — MIDAZOLAM HCL 10 MG/2ML IJ SOLN
INTRAMUSCULAR | Status: AC
  Filled 2018-05-30: qty 2

## 2018-05-30 MED ORDER — HEPARIN SODIUM (PORCINE) 1000 UNIT/ML IJ SOLN
INTRAMUSCULAR | Status: DC | PRN
  Administered 2018-05-30: 30000 [IU] via INTRAVENOUS

## 2018-05-30 MED ORDER — ARTIFICIAL TEARS OPHTHALMIC OINT
TOPICAL_OINTMENT | OPHTHALMIC | Status: AC
  Filled 2018-05-30: qty 3.5

## 2018-05-30 MED ORDER — SODIUM CHLORIDE 0.9 % IV SOLN
INTRAVENOUS | Status: DC | PRN
  Administered 2018-05-30: 750 mg via INTRAVENOUS

## 2018-05-30 MED ORDER — PHENYLEPHRINE 40 MCG/ML (10ML) SYRINGE FOR IV PUSH (FOR BLOOD PRESSURE SUPPORT)
PREFILLED_SYRINGE | INTRAVENOUS | Status: AC
  Filled 2018-05-30: qty 10

## 2018-05-30 MED ORDER — DEXMEDETOMIDINE HCL IN NACL 400 MCG/100ML IV SOLN: 0.0000 ug/kg/h | INTRAVENOUS | Status: DC

## 2018-05-30 MED ORDER — SODIUM CHLORIDE 0.9 % IV SOLN
1.5000 g | Freq: Two times a day (BID) | INTRAVENOUS | Status: AC
  Administered 2018-05-30 – 2018-06-01 (×4): 1.5 g via INTRAVENOUS
  Filled 2018-05-30 (×4): qty 1.5

## 2018-05-30 MED ORDER — POTASSIUM CHLORIDE 10 MEQ/50ML IV SOLN
10.0000 meq | INTRAVENOUS | Status: AC
  Administered 2018-05-30 (×3): 10 meq via INTRAVENOUS

## 2018-05-30 MED ORDER — ARTIFICIAL TEARS OPHTHALMIC OINT
TOPICAL_OINTMENT | OPHTHALMIC | Status: DC | PRN
  Administered 2018-05-30: 1 via OPHTHALMIC

## 2018-05-30 MED ORDER — SODIUM CHLORIDE 0.9 % IV SOLN
INTRAVENOUS | Status: DC | PRN
  Administered 2018-05-30: 14:00:00 via INTRAVENOUS

## 2018-05-30 MED ORDER — MAGNESIUM SULFATE 4 GM/100ML IV SOLN
4.0000 g | Freq: Once | INTRAVENOUS | Status: AC
  Administered 2018-05-30: 4 g via INTRAVENOUS

## 2018-05-30 MED ORDER — MIDAZOLAM HCL 2 MG/2ML IJ SOLN
INTRAMUSCULAR | Status: AC
  Filled 2018-05-30: qty 2

## 2018-05-30 SURGICAL SUPPLY — 161 items
ADAPTER CARDIO PERF ANTE/RETRO (ADAPTER) ×6 IMPLANT
ADH SKN CLS LQ APL DERMABOND (GAUZE/BANDAGES/DRESSINGS) ×2
ADPR PRFSN 84XANTGRD RTRGD (ADAPTER) ×4
ARTICLIP LAA PROCLIP II 45 (Clip) ×3 IMPLANT
ATTRACTOMAT 16X20 MAGNETIC DRP (DRAPES) ×3 IMPLANT
BAG DECANTER FOR FLEXI CONT (MISCELLANEOUS) ×9 IMPLANT
BANDAGE ACE 4X5 VEL STRL LF (GAUZE/BANDAGES/DRESSINGS) ×3 IMPLANT
BANDAGE ACE 6X5 VEL STRL LF (GAUZE/BANDAGES/DRESSINGS) ×3 IMPLANT
BASKET HEART (ORDER IN 25'S) (MISCELLANEOUS) ×1
BASKET HEART (ORDER IN 25S) (MISCELLANEOUS) ×2 IMPLANT
BLADE CLIPPER SURG (BLADE) IMPLANT
BLADE STERNUM SYSTEM 6 (BLADE) ×6 IMPLANT
BLADE SURG 11 STRL SS (BLADE) ×6 IMPLANT
BNDG GAUZE ELAST 4 BULKY (GAUZE/BANDAGES/DRESSINGS) ×3 IMPLANT
CANISTER SUCT 3000ML PPV (MISCELLANEOUS) ×6 IMPLANT
CANN PRFSN 3/8X14X24FR PCFC (MISCELLANEOUS)
CANN PRFSN 3/8XCNCT ST RT ANG (MISCELLANEOUS)
CANNULA EZ GLIDE AORTIC 21FR (CANNULA) ×5 IMPLANT
CANNULA FEM VENOUS REMOTE 22FR (CANNULA) ×1 IMPLANT
CANNULA GUNDRY RCSP 15FR (MISCELLANEOUS) ×6 IMPLANT
CANNULA PRFSN 3/8X14X24FR PCFC (MISCELLANEOUS) IMPLANT
CANNULA PRFSN 3/8XCNCT RT ANG (MISCELLANEOUS) IMPLANT
CANNULA SOFTFLOW AORTIC 7M21FR (CANNULA) ×3 IMPLANT
CANNULA SUMP PERICARDIAL (CANNULA) ×1 IMPLANT
CANNULA VEN MTL TIP RT (MISCELLANEOUS)
CANNULA VENNOUS METAL TIP 20FR (CANNULA) ×1 IMPLANT
CATH CPB KIT OWEN (MISCELLANEOUS) ×3 IMPLANT
CATH HEART VENT LEFT (CATHETERS) ×2 IMPLANT
CATH THORACIC 28FR RT ANG (CATHETERS) IMPLANT
CATH THORACIC 36FR (CATHETERS) ×3 IMPLANT
CATH THORACIC 36FR RT ANG (CATHETERS) ×3 IMPLANT
CLAMP ISOLATOR SYNERGY LG (MISCELLANEOUS) ×2 IMPLANT
CLAMP OLL ABLATION (MISCELLANEOUS) ×1 IMPLANT
CLIP FOGARTY SPRING 6M (CLIP) IMPLANT
CLIP RETRACTION 3.0MM CORONARY (MISCELLANEOUS) ×1 IMPLANT
CLIP VESOCCLUDE MED 24/CT (CLIP) IMPLANT
CLIP VESOCCLUDE SM WIDE 24/CT (CLIP) IMPLANT
CONN 1/2X1/2X1/2  BEN (MISCELLANEOUS) ×1
CONN 1/2X1/2X1/2 BEN (MISCELLANEOUS) ×2 IMPLANT
CONN 3/8X1/2 ST GISH (MISCELLANEOUS) ×6 IMPLANT
CONN ST 1/4X3/8  BEN (MISCELLANEOUS) ×2
CONN ST 1/4X3/8 BEN (MISCELLANEOUS) IMPLANT
CONT SPEC 4OZ CLIKSEAL STRL BL (MISCELLANEOUS) ×1 IMPLANT
COVER PROBE W GEL 5X96 (DRAPES) ×1 IMPLANT
COVER SURGICAL LIGHT HANDLE (MISCELLANEOUS) ×6 IMPLANT
COVER WAND RF STERILE (DRAPES) ×6 IMPLANT
CRADLE DONUT ADULT HEAD (MISCELLANEOUS) ×6 IMPLANT
DERMABOND ADHESIVE PROPEN (GAUZE/BANDAGES/DRESSINGS) ×1
DERMABOND ADVANCED .7 DNX6 (GAUZE/BANDAGES/DRESSINGS) IMPLANT
DEVICE ATRICLIP LAA PRCLPII 45 (Clip) IMPLANT
DEVICE SUT CK QUICK LOAD INDV (Prosthesis & Implant Heart) ×3 IMPLANT
DEVICE SUT CK QUICK LOAD MINI (Prosthesis & Implant Heart) ×1 IMPLANT
DRAIN CHANNEL 32F RND 10.7 FF (WOUND CARE) ×9 IMPLANT
DRAPE BILATERAL SPLIT (DRAPES) IMPLANT
DRAPE CARDIOVASCULAR INCISE (DRAPES) ×3
DRAPE CV SPLIT W-CLR ANES SCRN (DRAPES) IMPLANT
DRAPE INCISE IOBAN 66X45 STRL (DRAPES) ×7 IMPLANT
DRAPE SLUSH/WARMER DISC (DRAPES) ×6 IMPLANT
DRAPE SRG 135X102X78XABS (DRAPES) ×2 IMPLANT
DRSG AQUACEL AG ADV 3.5X 4 (GAUZE/BANDAGES/DRESSINGS) ×1 IMPLANT
DRSG COVADERM 4X14 (GAUZE/BANDAGES/DRESSINGS) ×6 IMPLANT
ELECT BLADE 4.0 EZ CLEAN MEGAD (MISCELLANEOUS) ×3
ELECT REM PT RETURN 9FT ADLT (ELECTROSURGICAL) ×12
ELECTRODE BLDE 4.0 EZ CLN MEGD (MISCELLANEOUS) ×2 IMPLANT
ELECTRODE REM PT RTRN 9FT ADLT (ELECTROSURGICAL) ×8 IMPLANT
FELT TEFLON 1X6 (MISCELLANEOUS) ×6 IMPLANT
GAUZE SPONGE 4X4 12PLY STRL (GAUZE/BANDAGES/DRESSINGS) ×10 IMPLANT
GAUZE SPONGE 4X4 12PLY STRL LF (GAUZE/BANDAGES/DRESSINGS) ×1 IMPLANT
GLOVE BIO SURGEON STRL SZ 6 (GLOVE) ×3 IMPLANT
GLOVE BIO SURGEON STRL SZ 6.5 (GLOVE) IMPLANT
GLOVE BIO SURGEON STRL SZ7 (GLOVE) IMPLANT
GLOVE BIO SURGEON STRL SZ7.5 (GLOVE) ×2 IMPLANT
GLOVE ORTHO TXT STRL SZ7.5 (GLOVE) ×9 IMPLANT
GOWN STRL REUS W/ TWL LRG LVL3 (GOWN DISPOSABLE) ×16 IMPLANT
GOWN STRL REUS W/TWL LRG LVL3 (GOWN DISPOSABLE) ×24
HEMOSTAT POWDER SURGIFOAM 1G (HEMOSTASIS) ×18 IMPLANT
INSERT FOGARTY XLG (MISCELLANEOUS) ×6 IMPLANT
KIT BASIN OR (CUSTOM PROCEDURE TRAY) ×6 IMPLANT
KIT DILATOR VASC 18G NDL (KITS) ×1 IMPLANT
KIT DRAINAGE VACCUM ASSIST (KITS) ×1 IMPLANT
KIT SUCTION CATH 14FR (SUCTIONS) ×19 IMPLANT
KIT SUT CK MINI COMBO 4X17 (Prosthesis & Implant Heart) ×1 IMPLANT
KIT TURNOVER KIT B (KITS) ×6 IMPLANT
KIT VASOVIEW HEMOPRO 2 VH 4000 (KITS) ×2 IMPLANT
LEAD PACING MYOCARDI (MISCELLANEOUS) ×3 IMPLANT
LINE VENT (MISCELLANEOUS) ×1 IMPLANT
LOOP VESSEL SUPERMAXI WHITE (MISCELLANEOUS) ×4 IMPLANT
MARKER GRAFT CORONARY BYPASS (MISCELLANEOUS) ×9 IMPLANT
NS IRRIG 1000ML POUR BTL (IV SOLUTION) ×25 IMPLANT
PACK E OPEN HEART (SUTURE) ×3 IMPLANT
PACK OPEN HEART (CUSTOM PROCEDURE TRAY) ×6 IMPLANT
PAD ARMBOARD 7.5X6 YLW CONV (MISCELLANEOUS) ×12 IMPLANT
PAD ELECT DEFIB RADIOL ZOLL (MISCELLANEOUS) ×3 IMPLANT
PENCIL BUTTON HOLSTER BLD 10FT (ELECTRODE) ×3 IMPLANT
POWDER SURGICEL 3.0 GRAM (HEMOSTASIS) IMPLANT
PROBE CRYO2-ABLATION MALLABLE (MISCELLANEOUS) ×1 IMPLANT
PUNCH AORTIC ROTATE 4.0MM (MISCELLANEOUS) IMPLANT
PUNCH AORTIC ROTATE 4.5MM 8IN (MISCELLANEOUS) IMPLANT
PUNCH AORTIC ROTATE 5MM 8IN (MISCELLANEOUS) IMPLANT
SET CARDIOPLEGIA MPS 5001102 (MISCELLANEOUS) ×1 IMPLANT
SET IRRIG TUBING LAPAROSCOPIC (IRRIGATION / IRRIGATOR) ×6 IMPLANT
SOLUTION ANTI FOG 6CC (MISCELLANEOUS) IMPLANT
SPONGE LAP 18X18 RF (DISPOSABLE) ×1 IMPLANT
SPONGE LAP 18X18 X RAY DECT (DISPOSABLE) ×1 IMPLANT
SPONGE LAP 4X18 RFD (DISPOSABLE) ×2 IMPLANT
SUCKER INTRACARDIAC WEIGHTED (SUCKER) ×3 IMPLANT
SUT BONE WAX W31G (SUTURE) ×6 IMPLANT
SUT ETHIBON 2 0 V 52N 30 (SUTURE) ×6 IMPLANT
SUT ETHIBON EXCEL 2-0 V-5 (SUTURE) ×3 IMPLANT
SUT ETHIBOND 2 0 SH (SUTURE) ×6
SUT ETHIBOND 2 0 SH 36X2 (SUTURE) ×4 IMPLANT
SUT ETHIBOND 2 0 V4 (SUTURE) IMPLANT
SUT ETHIBOND 2 0V4 GREEN (SUTURE) IMPLANT
SUT ETHIBOND 4 0 RB 1 (SUTURE) IMPLANT
SUT ETHIBOND V-5 VALVE (SUTURE) ×3 IMPLANT
SUT ETHIBOND X763 2 0 SH 1 (SUTURE) ×14 IMPLANT
SUT MNCRL AB 3-0 PS2 18 (SUTURE) ×12 IMPLANT
SUT MNCRL AB 4-0 PS2 18 (SUTURE) IMPLANT
SUT PDS AB 1 CTX 36 (SUTURE) ×12 IMPLANT
SUT PROLENE 2 0 SH DA (SUTURE) IMPLANT
SUT PROLENE 3 0 SH 1 (SUTURE) ×3 IMPLANT
SUT PROLENE 3 0 SH DA (SUTURE) ×3 IMPLANT
SUT PROLENE 3 0 SH1 36 (SUTURE) ×6 IMPLANT
SUT PROLENE 4 0 RB 1 (SUTURE) ×24
SUT PROLENE 4 0 SH DA (SUTURE) ×9 IMPLANT
SUT PROLENE 4-0 RB1 .5 CRCL 36 (SUTURE) ×8 IMPLANT
SUT PROLENE 5 0 C 1 36 (SUTURE) IMPLANT
SUT PROLENE 6 0 C 1 30 (SUTURE) ×1 IMPLANT
SUT PROLENE 7.0 RB 3 (SUTURE) ×9 IMPLANT
SUT PROLENE 8 0 BV175 6 (SUTURE) IMPLANT
SUT PROLENE BLUE 7 0 (SUTURE) ×3 IMPLANT
SUT PROLENE POLY MONO (SUTURE) IMPLANT
SUT SILK  1 MH (SUTURE) ×3
SUT SILK 1 MH (SUTURE) ×4 IMPLANT
SUT SILK 2 0 SH CR/8 (SUTURE) IMPLANT
SUT SILK 3 0 SH CR/8 (SUTURE) IMPLANT
SUT STEEL 6MS V (SUTURE) IMPLANT
SUT STEEL STERNAL CCS#1 18IN (SUTURE) IMPLANT
SUT STEEL SZ 6 DBL 3X14 BALL (SUTURE) IMPLANT
SUT VIC AB 1 CTX 36 (SUTURE)
SUT VIC AB 1 CTX36XBRD ANBCTR (SUTURE) IMPLANT
SUT VIC AB 2-0 CT1 27 (SUTURE)
SUT VIC AB 2-0 CT1 TAPERPNT 27 (SUTURE) IMPLANT
SUT VIC AB 2-0 CTX 27 (SUTURE) IMPLANT
SUT VIC AB 3-0 SH 27 (SUTURE)
SUT VIC AB 3-0 SH 27X BRD (SUTURE) IMPLANT
SUT VIC AB 3-0 X1 27 (SUTURE) IMPLANT
SUT VICRYL 4-0 PS2 18IN ABS (SUTURE) IMPLANT
SYSTEM SAHARA CHEST DRAIN ATS (WOUND CARE) ×6 IMPLANT
TAPE CLOTH SURG 4X10 WHT LF (GAUZE/BANDAGES/DRESSINGS) ×1 IMPLANT
TAPE PAPER 2X10 WHT MICROPORE (GAUZE/BANDAGES/DRESSINGS) ×1 IMPLANT
TOWEL GREEN STERILE (TOWEL DISPOSABLE) ×6 IMPLANT
TOWEL GREEN STERILE FF (TOWEL DISPOSABLE) ×6 IMPLANT
TRAY FOLEY SLVR 14FR TEMP STAT (SET/KITS/TRAYS/PACK) ×3 IMPLANT
TRAY FOLEY SLVR 16FR TEMP STAT (SET/KITS/TRAYS/PACK) ×3 IMPLANT
TUBE SUCT ARGYLE STRL (TUBING) ×3 IMPLANT
TUBING INSUFFLATION (TUBING) ×2 IMPLANT
UNDERPAD 30X30 (UNDERPADS AND DIAPERS) ×6 IMPLANT
VALVE AORTIC SZ23 INSP/RESIL (Prosthesis & Implant Heart) ×1 IMPLANT
VENT LEFT HEART 12002 (CATHETERS) ×3
WATER STERILE IRR 1000ML POUR (IV SOLUTION) ×12 IMPLANT

## 2018-05-30 NOTE — Anesthesia Postprocedure Evaluation (Signed)
Anesthesia Post Note  Patient: Cody Phelps  Procedure(s) Performed: AORTIC VALVE REPLACEMENT (AVR) using Inspiris Valve, Size 23 (N/A Chest) CORONARY ARTERY BYPASS GRAFTING (CABG) x one using left internal mammary artery (N/A Chest) MAZE (N/A ) TRANSESOPHAGEAL ECHOCARDIOGRAM (TEE) (N/A )     Patient location during evaluation: SICU Anesthesia Type: General Level of consciousness: sedated Pain management: pain level controlled Vital Signs Assessment: post-procedure vital signs reviewed and stable Respiratory status: patient remains intubated per anesthesia plan Cardiovascular status: stable Postop Assessment: no apparent nausea or vomiting Anesthetic complications: no    Last Vitals:  Vitals:   05/30/18 0712 05/30/18 1454  BP:  (!) 115/55  Pulse: 60 80  Resp: 20 12  Temp:    SpO2: 100% 100%    Last Pain:  Vitals:   05/30/18 0604  TempSrc:   PainSc: 1                  Garrit Marrow DANIEL

## 2018-05-30 NOTE — Interval H&P Note (Signed)
History and Physical Interval Note:  05/30/2018 6:38 AM  Cody Phelps  has presented today for surgery, with the diagnosis of AS CAD AFIB  The various methods of treatment have been discussed with the patient and family. After consideration of risks, benefits and other options for treatment, the patient has consented to  Procedure(s): AORTIC VALVE REPLACEMENT (AVR) (N/A) CORONARY ARTERY BYPASS GRAFTING (CABG) (N/A) MAZE (N/A) TRANSESOPHAGEAL ECHOCARDIOGRAM (TEE) (N/A) as a surgical intervention .  The patient's history has been reviewed, patient examined, no change in status, stable for surgery.  I have reviewed the patient's chart and labs.  Questions were answered to the patient's satisfaction.     [name]

## 2018-05-30 NOTE — Anesthesia Procedure Notes (Signed)
Procedure Name: Intubation Date/Time: 05/30/2018 8:00 AM Performed by: Barrington Ellison, CRNA Pre-anesthesia Checklist: Patient identified, Emergency Drugs available, Suction available and Patient being monitored Patient Re-evaluated:Patient Re-evaluated prior to induction Oxygen Delivery Method: Circle System Utilized Preoxygenation: Pre-oxygenation with 100% oxygen Induction Type: IV induction Ventilation: Mask ventilation without difficulty and Two handed mask ventilation required Laryngoscope Size: Mac and 4 Grade View: Grade I Tube type: Oral Tube size: 8.0 mm Number of attempts: 1 Airway Equipment and Method: Stylet and Oral airway Placement Confirmation: ETT inserted through vocal cords under direct vision,  positive ETCO2 and breath sounds checked- equal and bilateral Secured at: 22 cm Tube secured with: Tape Dental Injury: Teeth and Oropharynx as per pre-operative assessment

## 2018-05-30 NOTE — Procedures (Signed)
Extubation Procedure Note  Patient Details:   Name: Cody Phelps DOB: 1940/09/23 MRN: [id number]   Airway Documentation:    Vent end date: 05/30/18 Vent end time: 1802   Evaluation  O2 sats: stable throughout Complications: No apparent complications Patient did tolerate procedure well. Bilateral Breath Sounds: Clear, Diminished   Yes   Patient extubated per order to 4L [place] with no apparent complications. Positive cuff leak was noted prior to extubation. Patient achieved NIF of -20 and VC of .80L. Patient is alert and oriented and is able to weakly speak. Vitals are stable. RT will continue to monitor.   Brahm Barbeau Clyda Greener 05/30/2018, 6:17 PM

## 2018-05-30 NOTE — Brief Op Note (Signed)
05/30/2018  2:30 PM  PATIENT:  Cody Phelps  77 y.o. male  PRE-OPERATIVE DIAGNOSIS:  AS CAD AFIB  POST-OPERATIVE DIAGNOSIS:  AS CAD AFIB  PROCEDURE:  Procedure(s): AORTIC VALVE REPLACEMENT (AVR) using Inspiris Valve, Size 23 (N/A) CORONARY ARTERY BYPASS GRAFTING (CABG) x one using left internal mammary artery (N/A) MAZE (N/A) TRANSESOPHAGEAL ECHOCARDIOGRAM (TEE) (N/A)  SURGEON:  Surgeon(s) and Role:    [name], MD - Primary  PHYSICIAN ASSISTANT: Jadene Pierini PA-C  ANESTHESIA:   general  EBL:  900  BLOOD ADMINISTERED:2 units CC PRBC  DRAINS: PLEURAL AND PERICARDIAL CHEST DRAINS   LOCAL MEDICATIONS USED:  NONE  SPECIMEN:  Source of Specimen:  AORTIC VALVE LEAFLETS  DISPOSITION OF SPECIMEN:  PATHOLOGY  COUNTS:  YES  TOURNIQUET:  * No tourniquets in log *  DICTATION: .Dragon Dictation  PLAN OF CARE: Admit to inpatient   PATIENT DISPOSITION:  ICU - intubated and hemodynamically stable.   Delay start of Pharmacological VTE agent (>24hrs) due to surgical blood loss or risk of bleeding: yes  COMPLICATIONS: NO KNOWN

## 2018-05-30 NOTE — Progress Notes (Signed)
This note also relates to the following rows which could not be included: SpO2 - Cannot attach notes to unvalidated device data  Rapid weaning protocol  

## 2018-05-30 NOTE — Anesthesia Procedure Notes (Signed)
Central Venous Catheter Insertion Performed by: Duane Boston, MD, anesthesiologist Start/End10/30/2019 6:47 AM, 05/30/2018 6:57 AM Patient location: Pre-op. Preanesthetic checklist: patient identified, IV checked, site marked, risks and benefits discussed, surgical consent, monitors and equipment checked, pre-op evaluation, timeout performed and anesthesia consent Position: Trendelenburg Lidocaine 1% used for infiltration and patient sedated Hand hygiene performed , maximum sterile barriers used  and Seldinger technique used Catheter size: 8.5 Fr Total catheter length 8. PA cath was placed.Sheath introducer Swan type:thermodilution PA Cath depth:50 Procedure performed using ultrasound guided technique. Ultrasound Notes:anatomy identified, needle tip was noted to be adjacent to the nerve/plexus identified, no ultrasound evidence of intravascular and/or intraneural injection and image(s) printed for medical record Attempts: 1 Following insertion, line sutured, dressing applied and Biopatch. Post procedure assessment: free fluid flow, blood return through all ports and no air  Patient tolerated the procedure well with no immediate complications.

## 2018-05-30 NOTE — Progress Notes (Signed)
Pt has his own nasal mask. Pt is on NIV at this time tolerating it well

## 2018-05-30 NOTE — Op Note (Signed)
CARDIOTHORACIC SURGERY OPERATIVE NOTE  Date of Procedure:  05/30/2018  Preoperative Diagnosis:   Severe Aortic Stenosis  Severe Coronary Artery Disease  Paroxysmal Atrial Fibrillation  Postoperative Diagnosis: Same  Procedure:    Aortic Valve Replacement  Edwards Inspiris Resilia Stented Bovine Pericardial Tissue Valve (size 86mm, ref # [id number], serial # [id number])   Coronary Artery Bypass Grafting x 1  Left Internal Mammary Artery to First Diagonal Branch Coronary Artery   Maze Procedure   complete bilateral atrial lesion set using bipolar radiofrequency and cryothermy ablation  clipping of left atrial appendage (Atriclip size 32mm)   Surgeon: Valentina Gu. Roxy Manns, MD  Assistant: Demaris Giovanni, PA-C  Anesthesia: Duane Boston, MD  Operative Findings:  severe aortic stenosis  Mild left ventricular systolic dysfunction  Good quality left internal mammary artery conduit  Good quality target vessel for grafting  Diffuse adhesions and chronic inflammation throughout the pericardial space  Severe calcification in aortic annulus extending into the LV outflow tract and mitral valve           BRIEF CLINICAL NOTE AND INDICATIONS FOR SURGERY  Patient is a 77 year old male with history of coronary artery disease status post recent acute myocardial infarction, aortic stenosis, pericardial effusion, hypertension, first-degree AV block, recent onset paroxysmal atrial fibrillation, obstructive sleep apnea on CPAP, hyperlipidemia, type 2 diabetes mellitus, iron deficient anemia, restless leg syndrome, and 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  Patient states that he has known of presence of a heart murmur for several years.  He has been followed by Dr. [name] with previous echocardiograms documenting the  presence of mild to moderate aortic stenosis and normal left ventricular systolic function.  The patient presented acutely with chest pain and shortness of breath on March 01, 2018 and was diagnosed with ST segment elevation myocardial infarction.  He was taken to the Cath Lab where he was found to have three-vessel coronary artery disease with subtotal occlusion of the mid to distal left anterior descending coronary artery.  He underwent attempted PCI but the procedure was aborted due to the presence of diffuse distal disease with organized thrombus consistent with late presentation of a completed transmural myocardial infarction.  The remainder of his coronary disease was treated medically.  During his subsequent convalescence he developed new onset paroxysmal atrial flutter and acute combined systolic and diastolic congestive heart failure.  Subsequent echocardiograms revealed moderate to severe aortic stenosis and moderate left ventricular systolic dysfunction with akinesis of the distal anterior wall and apex.  He recovered without subsequent complication and was discharged home on aspirin, Eliquis and amiodarone.  He was readmitted to the hospital August 15 through March 20, 2018 with shortness of breath consistent with worsening cough and shortness of breath felt consistent with acute on chronic combined systolic and diastolic congestive heart failure.  He was notably in atrial fibrillation at that time.  Follow-up echocardiogram performed at that time revealed no significant change in left ventricular function with ejection fraction estimated 40 to 45% and akinesis of the mid to distal anterior wall and apex.  A small to moderate sized partially loculated pericardial effusion was noted and felt to be consistent with hemopericardium related to likely pericarditis from the patient's recent myocardial infarction.  There is no sign of hemodynamic compromise.  Since hospital discharge she was slowly improving.   A third follow-up echocardiogram was performed  March 27, 2018 which revealed further decrease size and the patient's pericardial effusion.  Of note, transvalvular gradient across the aortic valve appeared somewhat higher on the most recent echocardiogram, suggestive of improved cardiac output.  There was also question of possible mild to moderate mitral stenosis.  The patient was referred for elective surgical consultation.  He was originally seen in consultation on April 03, 2018. At that time he was still struggling with recovery following his recent acute myocardial infarction. We discussed treatment options at length at that time including a comparison between conventional surgical aortic valve replacement with or without coronary artery bypass grafting and Maze procedure versus transcatheter aortic valve replacement. Under the circumstances the patient was felt to be relatively poor candidate for conventional surgery, but cardiac gated CT angiogram of the heart revealed anatomical findings relatively unfavorable for transcatheter aortic valve replacement because of extensive calcification in the aortic root. At that time a decision was made to hold off on making any plans for surgical intervention until the patient had additional time to recover from his previous myocardial infarction. During the interim the patient has also been seen in consultation by Dr. Beryle Beams because of his history of chronic anemia. The likely need for blood transfusion following surgical intervention was anticipated.  The patient has been seen in consultation and counseled at length regarding the indications, risks and potential benefits of surgery.  All questions have been answered, and the patient provides full informed consent for the operation as described.     DETAILS OF THE OPERATIVE PROCEDURE  Preparation:  The patient is brought to the operating room on the above mentioned date and central monitoring was  established by the anesthesia team including placement of Swan-Ganz catheter and radial arterial line. The patient is placed in the supine position on the operating table.  Intravenous antibiotics are administered. General endotracheal anesthesia is induced uneventfully. A Foley catheter is placed.  Baseline transesophageal echocardiogram was performed.  Findings were notable for poor acoustic windows which limited the exam to some degree.  The patient was noted to have severe aortic stenosis.  The aortic valve is trileaflet with severe thickening, calcification, and restricted leaflet mobility involving all 3 leaflets.  There was mild aortic insufficiency.  There was mild mitral regurgitation.  There was severe calcification in the mitral annulus extending into some of the leaflets of the mitral valve, but the mitral valve still functioned essentially normally with no significant stenosis and only mild mitral regurgitation.  Left ventricular function was close to normal with mild hypokinesis in the distal anterior wall.  The patient's chest, abdomen, both groins, and both lower extremities are prepared and draped in a sterile manner. A time out procedure is performed.   Surgical Approach and Conduit Harvest:  A median sternotomy incision was performed and the left internal mammary artery is dissected from the chest wall and prepared for bypass grafting. The left internal mammary artery is notably good quality conduit.  Following systemic heparinization, the left internal mammary artery was transected distally noted to have excellent flow.   Extracorporeal Cardiopulmonary Bypass and Myocardial Protection:  The pericardium is opened.  There is no pericardial effusion but there is severe adhesions and subacute inflammation with essentially complete obliteration of the pericardial space.  A combination of sharp dissection and electrocautery are utilized to mobilize the pericardium off of the anterior  surface of the heart in the ascending aorta.  The ascending aorta is normal in appearance.  The right common femoral  vein is cannulated using the Seldinger technique and a guidewire advanced into the right atrium using TEE guidance.  The patient is heparinized systemically and the femoral vein cannulated using a 22 Fr long femoral venous cannula.  The ascending aorta is cannulated for cardiopulmonary bypass.  Adequate heparinization is verified.    The entire pre-bypass portion of the operation was notable for stable hemodynamics.  Cardiopulmonary bypass was begun.  Sharp dissection and elect cautery are utilized to mobilize the right atrium away from the pericardium.  A second venous cannula is placed directly into the superior vena cava.   A retrograde cardioplegia cannula is placed through the right atrium into the coronary sinus.  A cardioplegia cannula is placed in the ascending aorta.  A temperature probe was placed in the interventricular septum.     The patient is cooled to 32C systemic temperature.  The aortic cross clamp is applied and cardioplegia is delivered initially in an antegrade fashion through the aortic root using modified [place] cold blood cardioplegia (Kennestone blood cardioplegia protocol).   The initial cardioplegic arrest is rapid with early diastolic arrest.  Repeat doses of cardioplegia are administered at 90 minutes and every 30 minutes thereafter through the coronary sinus catheter in order to maintain completely flat electrocardiogram and septal myocardial temperature below 15C.  Myocardial protection was felt to be excellent.   Maze Procedure (left atrial lesion set - part 1):  The AtriCure Synergy bipolar radiofrequency ablation clamp is used for all radiofrequency ablation lesions for the maze procedure.  The Atricure CryoICE nitrous oxide cryothermy system is utilized for all cryothermy ablation lesions.   The heart is retracted towards the surgeon's side and the  left sided pulmonary veins exposed.  This requires additional careful dissection to free up the epicardial surface of the left atrium and the pericardial reflection.  An elliptical ablation lesion is created around the base of the left sided pulmonary veins.  A similar elliptical lesion was created around the base of the left atrial appendage.  The left atrial appendage was obliterated using an left atrial appendage clip (Atricure Pro245 left atrial clip size 45 mm).  The heart was replaced into the pericardial sac.   Coronary Artery Bypass Grafting:  The first diagonal branch coronary artery was grafted with the left internal mammary artery in an end-to-side fashion.  At the site of distal anastomosis the target vessel was good quality and measured approximately 1.5 mm in diameter.   Maze Procedure (left atrial lesion set - part 2):  An elliptical ablation lesion is created around the base of the right sided pulmonary veins.  A left atriotomy incision was performed through the interatrial groove and extended partially across the back wall of the left atrium after opening the oblique sinus inferiorly.  The floor of the left atrium and the mitral valve were exposed using a self-retaining retractor.  The mitral valve was inspected.  A bipolar ablation lesion was placed across the dome of the left atrium from the cephalad apex of the atriotomy incision to reach the cephalad apex of the elliptical lesion around the left sided pulmonary veins.  A similar bipolar lesion was placed across the back wall of the left atrium from the caudad apex of the atriotomy incision to reach the caudad apex of the elliptical lesion around the left sided pulmonary veins, thereby completing a box.  Finally another bipolar lesion was placed across the back wall of the left atrium from the caudad apex of the  atriotomy incision towards the posterior mitral valve annulus.  This lesion was completed along the endocardial surface  onto the posterior mitral annulus with a 3 minute duration cryothermy lesion, followed by a second cryothermy lesion along the posterior epicardial surface of the left atrium across the coronary sinus with the probe extending up the epicardial surface of the lateral wall of the right atrium. This completes the entire left side lesion set of the Cox maze procedure.  The atriotomy was closed using a 2-layer closure of running 3-0 Prolene suture after placing a sump drain across the mitral valve to serve as a left ventricular vent.     Aortic Valve Replacement:  An oblique transverse aortotomy incision was performed.  The aortic valve was inspected and noted to be trileaflet with severe aortic stenosis.  The aortic valve leaflets were excised sharply and the aortic annulus decalcified.  Decalcification was notably tedious due to extensive calcification in the annulus and extending into the LV outflow tract and into the mitral valve.  There was extensive calcium beneath the annulus below the left sinus of Valsalva and the commissure between the left and right.  In this area the calcification extended well down into the mitral valve.  Ultimately this satisfactory calcification was achieved.  The aortic annulus was sized to accept a 23 mm prosthesis.  The aortic root and left ventricle were irrigated with copious cold saline solution.  Aortic valve replacement was performed using interrupted horizontal mattress 2-0 Ethibond pledgeted sutures with pledgets in the subannular position.  An Edwards Inspiris Resilia stented bovine pericardial tissue valve (size 23 mm, ref # [id number], serial # [id number]) was implanted uneventfully. The valve seated appropriately with adequate space beneath the left main and right coronary artery.  The aortotomy was closed using a 2-layer closure of running 4-0 Prolene suture.  The septal myocardial temperature rose rapidly after reperfusion of the left internal mammary artery graft.  One  final dose of warm retrograde "reanimation dose" cardioplegia was administered through the coronary sinus catheter while all air was evacuated through the aortic root.  The aortic cross clamp was removed after a total cross clamp time of 140 minutes.   Maze Procedure (right atrial lesion set):  The retrograde cardioplegia cannula was removed and the small hole in the right atrium extended a short distance.  The AtriCure Synergy bipolar radiofrequency ablation clamp is utilized to create a series of linear lesions in the right atrium, each with one limb of the clamp along the endocardial surface and the other along the epicardial surface. The first lesion is placed from the posterior apex of the atriotomy incision and along the lateral wall of the right atrium to reach the lateral aspect of the superior vena cava, connecting with the cryothermy lesion placed previously across the coronary sinus and up the epicardial surface of the lateral right atrium.  A second lesion is placed in the opposite direction from the posterior apex of the atriotomy incision along the lateral wall to reach the lateral aspect of the inferior vena cava. A third lesion is placed from the midportion of the atriotomy incision extending at a right angle to reach the tip of the right atrial appendage. A fourth lesion is placed from the anterior apex of the atriotomy incision in an anterior and inferior direction to reach the acute margin of the heart. Finally, the cryotherapy probe is utilized to complete the right atrial lesion set by placing the probe along the endocardial surface of the  right atrium from the anterior apex of the atriotomy incision to reach the tricuspid annulus at the 2:00 position. The right atriotomy incision is closed with a 2 layer closure of running 4-0 Prolene suture.   Procedure Completion:  The distal coronary anastomosis was inspected for hemostasis and appropriate graft orientation. Epicardial pacing wires  are fixed to the inferior right ventricular freewall and to the right atrial appendage. The patient is rewarmed to 37C temperature. The aortic and left ventricular vents were removed.  The patient is weaned and disconnected from cardiopulmonary bypass.  The patient's rhythm at separation from bypass was AV paced.  The patient was weaned from cardiopulmonary bypass without any inotropic support. Total cardiopulmonary bypass time for the operation was 189 minutes.  Followup transesophageal echocardiogram performed after separation from bypass revealed a well-seated bioprosthetic tissue valve in the aortic position that was functioning normally.  There was no paravalvular leak.  There were otherwise no changes from the preoperative exam.  The aortic and superior vena cava cannula were removed uneventfully. Protamine was administered to reverse the anticoagulation. The femoral venous cannula was removed and manual pressure held on the groin for 30 minutes.  The mediastinum and pleural space were inspected for hemostasis and irrigated with saline solution. The mediastinum and left pleural space were drained using 3 chest tubes placed through separate stab incisions inferiorly.  The soft tissues anterior to the aorta were reapproximated loosely. The sternum is closed with double strength sternal wire. The soft tissues anterior to the sternum were closed in multiple layers and the skin is closed with a running subcuticular skin closure.  The post-bypass portion of the operation was notable for stable rhythm and hemodynamics.  The patient received a total of 1 pack adult platelets and 2 units fresh frozen plasma due to coagulopathy and thrombocytopenia after separation from cardiopulmonary bypass and reversal of heparin with protamine.  The patient received 2 units packed red blood cells during the procedure due to anemia which was present preoperatively and exacerbated by acute blood loss and hemodilution during  cardiopulmonary bypass.   Disposition:  The patient tolerated the procedure well and is transported to the surgical intensive care in stable condition. There are no intraoperative complications. All sponge instrument and needle counts are verified correct at completion of the operation.   Valentina Gu. Roxy Manns MD 05/30/2018 2:31 PM

## 2018-05-30 NOTE — Progress Notes (Signed)
Notified Dr. Roxy Manns of pt's CI of 1.25 & CO of 2.5 which has remained unchanged over 2hrs despite x2 Albumin, x1 PRBC's transfused, UOP 75-164mL/hr, stable pressures, & current CT output 2mL/hr.  Dr. Roxy Manns advised to transfuse 1 more unit of PRBC's & continue towards extubation.

## 2018-05-30 NOTE — Transfer of Care (Signed)
Immediate Anesthesia Transfer of Care Note  Patient: Cody Phelps  Procedure(s) Performed: AORTIC VALVE REPLACEMENT (AVR) using Inspiris Valve, Size 23 (N/A Chest) CORONARY ARTERY BYPASS GRAFTING (CABG) x one using left internal mammary artery (N/A Chest) MAZE (N/A ) TRANSESOPHAGEAL ECHOCARDIOGRAM (TEE) (N/A )  Patient Location: ICU  Anesthesia Type:General  Level of Consciousness: Patient remains intubated per anesthesia plan  Airway & Oxygen Therapy: Patient remains intubated per anesthesia plan and Patient placed on Ventilator (see vital sign flow sheet for setting)  Post-op Assessment: Report given to RN  Post vital signs: Reviewed and stable  Last Vitals:  Vitals Value Taken Time  BP    Temp    Pulse    Resp    SpO2      Last Pain:  Vitals:   05/30/18 0604  TempSrc:   PainSc: 1       Patients Stated Pain Goal: 2 (11/64/35 3912)  Complications: No apparent anesthesia complications

## 2018-05-30 NOTE — Progress Notes (Addendum)
  Echocardiogram 2D Echocardiogram Transesophageal has been performed.  Cody Phelps 05/30/2018, 10:53 AM

## 2018-05-30 NOTE — Progress Notes (Addendum)
Patient ID: Cody Phelps, male   DOB: 1941/05/04, 77 y.o.   MRN: [id number] EVENING ROUNDS NOTE :     [address]       ,[place] [address]             [phone]                 Day of Surgery Procedure(s) (LRB): AORTIC VALVE REPLACEMENT (AVR) using Inspiris Valve, Size 23 (N/A) CORONARY ARTERY BYPASS GRAFTING (CABG) x one using left internal mammary artery (N/A) MAZE (N/A) TRANSESOPHAGEAL ECHOCARDIOGRAM (TEE) (N/A)  Total Length of Stay:  LOS: 0 days  BP 123/62   Pulse 90   Temp 99.5 F (37.5 C)   Resp 17   Ht 5\' 8"  (1.727 m)   Wt 89.8 kg   SpO2 100%   BMI 30.11 kg/m   .Intake/Output      10/29 0701 - 10/30 0700 10/30 0701 - 10/31 0700   I.V. (mL/kg)  2724.1 (30.3)   Blood  1752   IV Piggyback  21.9   Total Intake(mL/kg)  4497.9 (50.1)   Urine (mL/kg/hr)  1895 (1.8)   Blood  900   Chest Tube  180   Total Output  2975   Net  +1522.9          . sodium chloride    . sodium chloride 100 mL/hr at 05/30/18 1600  . [START ON 05/31/2018] sodium chloride    . sodium chloride 10 mL/hr at 05/30/18 1455  . albumin human    . cefUROXime (ZINACEF)  IV    . dexmedetomidine (PRECEDEX) IV infusion 0.4 mcg/kg/hr (05/30/18 1600)  . famotidine (PEPCID) IV    . insulin 1.5 mL/hr at 05/30/18 1600  . lactated ringers    . lactated ringers    . lactated ringers 50 mL/hr at 05/30/18 1600  . magnesium sulfate 20 mL/hr at 05/30/18 1600  . nitroGLYCERIN    . phenylephrine (NEO-SYNEPHRINE) Adult infusion    . vancomycin       Lab Results  Component Value Date   WBC 4.4 05/30/2018   HGB 8.8 (L) 05/30/2018   HCT 26.3 (L) 05/30/2018   PLT 104 (L) 05/30/2018   GLUCOSE 109 (H) 05/30/2018   CHOL 151 03/01/2018   TRIG 69 03/01/2018   HDL 55 03/01/2018   LDLCALC 82 03/01/2018   ALT 23 05/28/2018   AST 18 05/28/2018   NA 140 05/30/2018   K 3.6 05/30/2018   CL 106 05/30/2018   CREATININE 0.80 05/30/2018   BUN 11 05/30/2018   CO2 22 05/28/2018   PSA 2.61  06/07/2006   INR 1.44 05/30/2018   HGBA1C 5.8 (H) 05/28/2018   Just extubated  Awake and alert Av paced, DDD 90 Not bleeding No drips, ci 1.4 got 2 units prbc   Grace Isaac MD  Beeper [phone] Office [phone] 05/30/2018 6:42 PM

## 2018-05-30 NOTE — Anesthesia Procedure Notes (Signed)
Arterial Line Insertion Start/End10/30/2019 6:52 AM, 05/30/2018 6:56 AM Performed by: Barrington Ellison, CRNA, CRNA  Patient location: Pre-op. Preanesthetic checklist: patient identified Lidocaine 1% used for infiltration and patient sedated Left, radial was placed Catheter size: 20 G Hand hygiene performed  Allen's test indicative of satisfactory collateral circulation Attempts: 1 Procedure performed without using ultrasound guided technique. Following insertion, dressing applied and Biopatch. Post procedure assessment: normal  Patient tolerated the procedure well with no immediate complications.

## 2018-05-31 ENCOUNTER — Inpatient Hospital Stay (HOSPITAL_COMMUNITY): Payer: Medicare Other

## 2018-05-31 ENCOUNTER — Encounter (HOSPITAL_COMMUNITY): Payer: Self-pay | Admitting: Thoracic Surgery (Cardiothoracic Vascular Surgery)

## 2018-05-31 ENCOUNTER — Other Ambulatory Visit: Payer: Self-pay

## 2018-05-31 LAB — PREPARE FRESH FROZEN PLASMA
Unit division: 0
Unit division: 0

## 2018-05-31 LAB — BPAM RBC
Blood Product Expiration Date: 201911282359
Blood Product Expiration Date: 201911282359
Blood Product Expiration Date: 201911282359
Blood Product Expiration Date: 201911282359
ISSUE DATE / TIME: 201910300828
ISSUE DATE / TIME: 201910300828
ISSUE DATE / TIME: 201910301604
ISSUE DATE / TIME: 201910301604
Unit Type and Rh: 6200
Unit Type and Rh: 6200
Unit Type and Rh: 6200
Unit Type and Rh: 6200

## 2018-05-31 LAB — TYPE AND SCREEN
ABO/RH(D): A POS
Antibody Screen: NEGATIVE
Unit division: 0
Unit division: 0
Unit division: 0
Unit division: 0

## 2018-05-31 LAB — MAGNESIUM
Magnesium: 2.8 mg/dL — ABNORMAL HIGH (ref 1.7–2.4)
Magnesium: 2.4 mg/dL (ref 1.7–2.4)

## 2018-05-31 LAB — GLUCOSE, CAPILLARY
Glucose-Capillary: 100 mg/dL — ABNORMAL HIGH (ref 70–99)
Glucose-Capillary: 103 mg/dL — ABNORMAL HIGH (ref 70–99)
Glucose-Capillary: 105 mg/dL — ABNORMAL HIGH (ref 70–99)
Glucose-Capillary: 107 mg/dL — ABNORMAL HIGH (ref 70–99)
Glucose-Capillary: 107 mg/dL — ABNORMAL HIGH (ref 70–99)
Glucose-Capillary: 107 mg/dL — ABNORMAL HIGH (ref 70–99)
Glucose-Capillary: 110 mg/dL — ABNORMAL HIGH (ref 70–99)
Glucose-Capillary: 114 mg/dL — ABNORMAL HIGH (ref 70–99)
Glucose-Capillary: 114 mg/dL — ABNORMAL HIGH (ref 70–99)
Glucose-Capillary: 132 mg/dL — ABNORMAL HIGH (ref 70–99)
Glucose-Capillary: 140 mg/dL — ABNORMAL HIGH (ref 70–99)
Glucose-Capillary: 96 mg/dL (ref 70–99)
Glucose-Capillary: 97 mg/dL (ref 70–99)

## 2018-05-31 LAB — BASIC METABOLIC PANEL
Anion gap: 6 (ref 5–15)
Anion gap: 7 (ref 5–15)
BUN: 13 mg/dL (ref 8–23)
BUN: 14 mg/dL (ref 8–23)
CO2: 21 mmol/L — ABNORMAL LOW (ref 22–32)
CO2: 20 mmol/L — ABNORMAL LOW (ref 22–32)
Calcium: 7.9 mg/dL — ABNORMAL LOW (ref 8.9–10.3)
Calcium: 7.8 mg/dL — ABNORMAL LOW (ref 8.9–10.3)
Chloride: 112 mmol/L — ABNORMAL HIGH (ref 98–111)
Chloride: 110 mmol/L (ref 98–111)
Creatinine, Ser: 1.07 mg/dL (ref 0.61–1.24)
Creatinine, Ser: 1.08 mg/dL (ref 0.61–1.24)
GFR calc Af Amer: >60 mL/min (ref >60)
GFR calc Af Amer: >60 mL/min (ref >60)
GFR calc non Af Amer: >60 mL/min (ref >60)
GFR calc non Af Amer: >60 mL/min (ref >60)
Glucose, Bld: 112 mg/dL — ABNORMAL HIGH (ref 70–99)
Glucose, Bld: 136 mg/dL — ABNORMAL HIGH (ref 70–99)
Potassium: 4.2 mmol/L (ref 3.5–5.1)
Potassium: 3.7 mmol/L (ref 3.5–5.1)
Sodium: 139 mmol/L (ref 135–145)
Sodium: 137 mmol/L (ref 135–145)

## 2018-05-31 LAB — BPAM PLATELET PHERESIS
Blood Product Expiration Date: 201910302359
ISSUE DATE / TIME: 201910301257
Unit Type and Rh: 6200

## 2018-05-31 LAB — BPAM FFP
Blood Product Expiration Date: 201910312359
Blood Product Expiration Date: 201910312359
ISSUE DATE / TIME: 201910301258
ISSUE DATE / TIME: 201910301258
Unit Type and Rh: 600
Unit Type and Rh: 6200

## 2018-05-31 LAB — CBC
HCT: 30.6 % — ABNORMAL LOW (ref 39.0–52.0)
HCT: 29.4 % — ABNORMAL LOW (ref 39.0–52.0)
Hemoglobin: 9.7 g/dL — ABNORMAL LOW (ref 13.0–17.0)
Hemoglobin: 9.3 g/dL — ABNORMAL LOW (ref 13.0–17.0)
MCH: 28.1 pg (ref 26.0–34.0)
MCH: 28.4 pg (ref 26.0–34.0)
MCHC: 31.7 g/dL (ref 30.0–36.0)
MCHC: 31.6 g/dL (ref 30.0–36.0)
MCV: 88.7 fL (ref 80.0–100.0)
MCV: 89.9 fL (ref 80.0–100.0)
Platelets: 116 10*3/uL — ABNORMAL LOW (ref 150–400)
Platelets: 92 10*3/uL — ABNORMAL LOW (ref 150–400)
RBC: 3.45 MIL/uL — ABNORMAL LOW (ref 4.22–5.81)
RBC: 3.27 MIL/uL — ABNORMAL LOW (ref 4.22–5.81)
RDW: 15.8 % — ABNORMAL HIGH (ref 11.5–15.5)
RDW: 15.8 % — ABNORMAL HIGH (ref 11.5–15.5)
WBC: 10.4 10*3/uL (ref 4.0–10.5)
WBC: 9.4 10*3/uL (ref 4.0–10.5)
nRBC: 0 % (ref 0.0–0.2)
nRBC: 0 % (ref 0.0–0.2)

## 2018-05-31 LAB — COOXEMETRY PANEL
Carboxyhemoglobin: 1.4 % (ref 0.5–1.5)
Methemoglobin: 1.6 % — ABNORMAL HIGH (ref 0.0–1.5)
O2 Saturation: 55.7 %
Total hemoglobin: 9.2 g/dL — ABNORMAL LOW (ref 12.0–16.0)

## 2018-05-31 LAB — PREPARE PLATELET PHERESIS: Unit division: 0

## 2018-05-31 MED ORDER — INSULIN ASPART 100 UNIT/ML ~~LOC~~ SOLN
0.0000 [IU] | SUBCUTANEOUS | Status: DC
  Administered 2018-05-31 – 2018-06-01 (×3): 2 [IU] via SUBCUTANEOUS

## 2018-05-31 MED ORDER — INSULIN DETEMIR 100 UNIT/ML ~~LOC~~ SOLN
20.0000 [IU] | Freq: Every day | SUBCUTANEOUS | Status: DC
  Filled 2018-05-31: qty 0.2

## 2018-05-31 MED ORDER — POTASSIUM CHLORIDE 10 MEQ/50ML IV SOLN
10.0000 meq | INTRAVENOUS | Status: AC
  Administered 2018-05-31 – 2018-06-01 (×3): 10 meq via INTRAVENOUS
  Filled 2018-05-31 (×3): qty 50

## 2018-05-31 MED ORDER — FUROSEMIDE 10 MG/ML IJ SOLN
20.0000 mg | Freq: Two times a day (BID) | INTRAMUSCULAR | Status: DC
  Administered 2018-05-31: 20 mg via INTRAVENOUS
  Filled 2018-05-31: qty 2

## 2018-05-31 MED ORDER — INSULIN DETEMIR 100 UNIT/ML ~~LOC~~ SOLN
20.0000 [IU] | Freq: Once | SUBCUTANEOUS | Status: AC
  Administered 2018-05-31: 20 [IU] via SUBCUTANEOUS
  Filled 2018-05-31: qty 0.2

## 2018-05-31 MED FILL — Heparin Sodium (Porcine) Inj 1000 Unit/ML: INTRAMUSCULAR | Qty: 30 | Status: AC

## 2018-05-31 MED FILL — Magnesium Sulfate Inj 50%: INTRAMUSCULAR | Qty: 10 | Status: AC

## 2018-05-31 MED FILL — Potassium Chloride Inj 2 mEq/ML: INTRAVENOUS | Qty: 40 | Status: AC

## 2018-05-31 NOTE — Discharge Summary (Signed)
Physician Discharge Summary  Patient ID: Cody Phelps MRN: [id number] DOB/AGE: [id number] 75 y.o.  Admit date: 05/30/2018 Discharge date: 06/04/2018  Admission Diagnoses:  Patient Active Problem List   Diagnosis Date Noted  . Anemia, normocytic normochromic 05/21/2018  . Coronary artery disease   . 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 Pericardial effusion 03/20/2018  . Acute combined systolic and diastolic heart failure ([place]) 03/15/2018  . Paroxysmal atrial fibrillation ([place]) 03/05/2018  . Acute anterior wall MI ([place]) 03/01/2018  . Severe aortic stenosis 03/01/2018  . Acute MI anterior wall first episode care ([place]) 03/01/2018  . Primary osteoarthritis of right hip 10/23/2017  . Osteoarthritis of right hip 10/18/2017  . Malignant neoplasm of prostate ([place]) 04/21/2017  . RLS (restless legs syndrome) 11/20/2014  . Lumbar stenosis with neurogenic claudication 02/12/2014  . Obstructive sleep apnea 11/25/2013  . DM (diabetes mellitus) type 2, uncontrolled, with ketoacidosis ([place]) 11/25/2013  . HTN (hypertension) 11/25/2013  . Bronchitis, acute 05/04/2012  . Lymphadenopathy 05/04/2012  . OTHER CHRONIC SINUSITIS 08/08/2008  . RHINITIS 08/05/2008  . SLEEP APNEA 08/05/2008  . FOOT PAIN, RIGHT 06/10/2008  . Type II diabetes mellitus ([place]) 08/31/2006  . Essential hypertension 08/31/2006  . DIVERTICULOSIS, COLON 08/31/2006  . LEG CRAMPS 08/31/2006   Discharge Diagnoses:   Patient Active Problem List   Diagnosis Date Noted  . Anemia, normocytic normochromic 05/21/2018  . Coronary artery disease   . 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 Pericardial effusion 03/20/2018  . Acute combined systolic and diastolic heart failure ([place]) 03/15/2018  . Paroxysmal atrial fibrillation ([place]) 03/05/2018  . Acute anterior wall MI ([place]) 03/01/2018  . Severe aortic stenosis 03/01/2018  . Acute MI anterior wall first episode care ([place]) 03/01/2018  . Primary osteoarthritis of right hip  10/23/2017  . Osteoarthritis of right hip 10/18/2017  . Malignant neoplasm of prostate ([place]) 04/21/2017  . RLS (restless legs syndrome) 11/20/2014  . Lumbar stenosis with neurogenic claudication 02/12/2014  . Obstructive sleep apnea 11/25/2013  . DM (diabetes mellitus) type 2, uncontrolled, with ketoacidosis ([place]) 11/25/2013  . HTN (hypertension) 11/25/2013  . Bronchitis, acute 05/04/2012  . Lymphadenopathy 05/04/2012  . OTHER CHRONIC SINUSITIS 08/08/2008  . RHINITIS 08/05/2008  . SLEEP APNEA 08/05/2008  . FOOT PAIN, RIGHT 06/10/2008  . Type II diabetes mellitus ([place]) 08/31/2006  . Essential hypertension 08/31/2006  . DIVERTICULOSIS, COLON 08/31/2006  . LEG CRAMPS 08/31/2006   Discharged Condition: good  History of Present Illness:  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  He was referred to [facility] for surgical evaluation for his coronary disease and aortic stenosis.  He was initially evaluated on April 03, 2018. Patient states that he has known of presence of a heart murmur for several years.  He has been followed by Dr. [name] with previous echocardiograms documenting the presence of mild to moderate aortic stenosis and normal left ventricular systolic function.  The patient presented acutely with chest pain and shortness of breath on March 01, 2018 and was diagnosed with ST segment elevation myocardial infarction.  He was taken to the Cath Lab where he was found to have three-vessel coronary artery disease with subtotal occlusion of the mid to distal left  anterior descending coronary artery.  He  underwent attempted PCI but the procedure was aborted due to the presence of diffuse distal disease with organized thrombus consistent with late presentation of a completed transmural myocardial infarction.  The remainder of his coronary disease was treated medically.  During his subsequent convalescence he developed new onset paroxysmal atrial flutter and acute combined systolic and diastolic congestive heart failure.  Subsequent echocardiograms revealed moderate to severe aortic stenosis and moderate left ventricular systolic dysfunction with akinesis of the distal anterior wall and apex.  He recovered without subsequent complication and was discharged home on aspirin, Eliquis and amiodarone.  He was readmitted to the hospital August 15 through March 20, 2018 with shortness of breath consistent with worsening cough and shortness of breath felt consistent with acute on chronic combined systolic and diastolic congestive heart failure.  He was notably in atrial fibrillation at that time.  Follow-up echocardiogram performed at that time revealed no significant change in left ventricular function with ejection fraction estimated 40 to 45% and akinesis of the mid to distal anterior wall and apex.  A small to moderate sized partially loculated pericardial effusion was noted and felt to be consistent with hemopericardium related to likely pericarditis from the patient's recent myocardial infarction.  There is no sign of hemodynamic compromise.  Since hospital discharge she was slowly improving.  A third follow-up echocardiogram was performed March 27, 2018 which revealed further decrease size and the patient's pericardial effusion.  Of note, transvalvular gradient across the aortic valve appeared somewhat higher on the most recent echocardiogram, suggestive of improved cardiac output.  He was offered surgical intervention.  The risks and benefits of the procedure were explained to the patient and he felt that he  required further time to recover from his heart attack.  The patient was discussed by the TAVR heart team which felt the patient would not be an ideal candidate due to increased risk of perivalvular leak and annular rupture with TAVR procedure.  He again presented for follow up on 05/07/2018 at which time the patient decided to proceed with conventional aortic valve surgery.  The risks and benefits of the procedure were again explained to the patient and he was agreeable to proceed.     Hospital Course:   [name] presented to [facility] on 05/30/2018.  He was taken to the operating room and underwent AVR, MAZE procedure, and CABG x 1 utilizing LIMA to Diagonal.  He tolerated the procedure without difficulty and was taken to the SICU in stable condition.  During his stay in the SICU the patient was weaned and extubated on POD #1.  The patient required transfusion post operatively for expected post operative blood loss anemia.  Post operative EKG showed Sinus Bradycardia with 1st degree AV Block.  He was weaned off Milrinone as tolerated.  He was started on Amiodarone for MAZE prophylaxis.  He was started on low dose coumadin.  He was given IV diuretics for hypervolemia.  He was maintaining NSR and felt stable for transfer to the telemetry unit on 06/01/2018.  He continued to make progress.  He remains in NSR.  His pacing wires have been removed without difficulty.  He remains on coumadin at 4 mg daily.  His most recent INR is 1.23.  His INR should be kept around 2.0-2.5.  He continues to ambulate independently.  His incisions are healing without evidence on infection.  His pain is well controlled.  He is medically stable for discharge  home today.  Significant Diagnostic Studies: cardiac graphics:   Echocardiogram:    - Left ventricle: The cavity size was normal. There was moderate   concentric hypertrophy. Systolic function was mildly reduced. The   estimated ejection fraction was in the range  of 45% to 50%.   Akinesis and aneurysmal deformity of the apical myocardium;   consistent with infarction in the distribution of the left   anterior descending coronary artery. Doppler parameters are   consistent with abnormal left ventricular relaxation (grade 1   diastolic dysfunction). Doppler parameters are consistent with   elevated mean left atrial filling pressure. - Aortic valve: There was severe stenosis. Valve area (VTI): 0.89   cm^2. - Mitral valve: Moderately calcified annulus. Severely thickened,   mildly calcified leaflets . The findings are consistent with mild   to moderate stenosis. There was mild regurgitation. Valve area by   pressure half-time: 1.82 cm^2. Valve area by continuity equation   (using LVOT flow): 2.46 cm^2. - Left atrium: The atrium was severely dilated. - Pulmonary arteries: Systolic pressure was mildly increased. PA   peak pressure: 34 mm Hg (S). - Pericardium, extracardiac: A small pericardial effusion was   identified circumferential to the heart. There was no evidence of   hemodynamic compromise.  Impressions:  - The previously described pericardial effusion is smaller. Aortic   valve gradients are higher, possibly due to higher cardiac   output. Direct comparison with previous images shows no change in   wall motion, consistent with transmural infarction in the distal   LAD artery teritory. Mitral stenosis parameters are consistent   with previous measurements, but signify mild-moderate mitral   stenosis.  Coronary angiogram 03/20/2018: Left main mildly calcified but normal, LAD large caliber vessel, mild to moderate diffuse disease followed by occlusion in the mid to distal segment, distally the LAD appears to be diffusely diseased with TIMI I flow.  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  D1 is small, D-2 is moderate to  large  sized with tandem 80 to 90% stenosis, mild to moderate calcification.  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 RCA small, non-dominant and diffusely diseased without high-grade stenosis. Severe aortic stenosis with peak to peak gradient of 69 and mean gradient of 53 mmHg.  Severely calcified mitral annulus and moderately calcified aortic valve.  Recommend uninterrupted dual antiplatelet therapy with Aspirin 81mg  daily and Ticagrelor 90mg  twice daily for a minimum of 1 month (bare metal stent - Class I recommendation).  This is being done for STEMI with completed anterior wall infarct until he recovers and will be considered for either TAVR or CABG, high-grade diagonal 2 disease with aortic valve replacement.  Treatments: surgery:     Aortic Valve Replacement             Edwards Inspiris Resilia Stented Bovine Pericardial Tissue Valve (size 69mm, ref # [id number], serial # [id number])   Coronary Artery Bypass Grafting x 1             Left Internal Mammary Artery to First Diagonal Branch Coronary Artery   Maze Procedure              complete bilateral atrial lesion set using bipolar radiofrequency and cryothermy ablation             clipping of left atrial appendage (Atriclip  size 70mm  Discharge Exam: Blood pressure (!) 114/58, pulse 77, temperature 98.5 F (36.9 C), temperature source Oral, resp. rate (!) 23, height 5\' 8"  (1.727 m), weight 91.9 kg, SpO2 98 %.   General appearance: alert, cooperative and no distress Heart: regular rate and rhythm Lungs: mildly dim in bases Abdomen: benign Extremities: no edema Wound: incis healing well   Disposition: Discharge disposition: 01-Home or Self Care      Home  Discharge Medications:  The patient has been discharged on:   1.Beta Blocker:  Yes [ y  ]                              No   [   ]                              If No, reason:  2.Ace Inhibitor/ARB: Yes [ A  ]                                      No  [    ]                                     If No, reason:  3.Statin:   Yes Linda.Low   ]                  No  [   ]                  If No, reason:  4.Ecasa:  Yes  [ A  ]                  No   [   ]                  If No, reason:  Discharge Instructions    Amb Referral to Cardiac Rehabilitation   Complete by:  As directed    Diagnosis:   CABG Valve Replacement     Valve:  Aortic   CABG X ___:  1   Discharge patient   Complete by:  As directed    Discharge disposition:  01-Home or Self Care   Discharge patient date:  06/04/2018     Allergies as of 06/04/2018   No Known Allergies     Medication List    STOP taking these medications   apixaban 5 MG Tabs tablet Commonly known as:  ELIQUIS   aspirin EC 81 MG tablet   guaiFENesin 100 MG/5ML Soln Commonly known as:  ROBITUSSIN   nitroGLYCERIN 0.4 MG SL tablet Commonly known as:  NITROSTAT     TAKE these medications   acetaminophen 325 MG tablet Commonly known as:  TYLENOL Take 1 tablet (325 mg total) by mouth every 4 (four) hours as needed (headache, pain).   amiodarone 200 MG tablet Commonly known as:  PACERONE Take 1 tablet (200 mg total) by mouth daily. Start taking on:  06/05/2018   atorvastatin 80 MG tablet Commonly known as:  LIPITOR Take 1 tablet (80 mg total) by mouth daily at 6 PM. What changed:  when to take this   colesevelam 625 MG tablet Commonly known as:  WELCHOL Take 1,875  mg by mouth daily.   donepezil 10 MG tablet Commonly known as:  ARICEPT Take 10 mg by mouth every morning.   esomeprazole 40 MG capsule Commonly known as:  NEXIUM Take 40 mg by mouth daily.   ferrous sulfate 325 (65 FE) MG tablet Take 1 tablet (325 mg total) by mouth 3 (three) times daily with meals. What changed:  when to take this   LANTUS SOLOSTAR 100 UNIT/ML injection Generic drug:  insulin glargine Inject 20-44 Units into the skin at bedtime as needed (for BGL >150).   lisinopril  2.5 MG tablet Commonly known as:  PRINIVIL,ZESTRIL Take 1 tablet (2.5 mg total) by mouth daily. Start taking on:  06/05/2018   memantine 5 MG tablet Commonly known as:  NAMENDA Take 5 mg by mouth 2 (two) times daily.   metoprolol tartrate 25 MG tablet Commonly known as:  LOPRESSOR Take 0.5 tablets (12.5 mg total) by mouth 2 (two) times daily.   MYRBETRIQ 50 MG Tb24 tablet Generic drug:  mirabegron ER Take 50 mg by mouth daily.   omega-3 acid ethyl esters 1 g capsule Commonly known as:  LOVAZA Take 2 g by mouth daily.   oxyCODONE 5 MG immediate release tablet Commonly known as:  Oxy IR/ROXICODONE Take 1 tablet (5 mg total) by mouth every 6 (six) hours as needed for up to 7 days for severe pain.   potassium chloride SA 20 MEQ tablet Commonly known as:  K-DUR,KLOR-CON Take 1 tablet (20 mEq total) by mouth daily with breakfast. Start taking on:  06/05/2018   rOPINIRole 2 MG tablet Commonly known as:  REQUIP Take 2 mg by mouth 2 (two) times daily.   spironolactone 25 MG tablet Commonly known as:  ALDACTONE Take 0.5 tablets (12.5 mg total) by mouth daily.   tamsulosin 0.4 MG Caps capsule Commonly known as:  FLOMAX Take 0.4 mg by mouth daily after supper.   torsemide 20 MG tablet Commonly known as:  DEMADEX Take 1 tablet (20 mg total) by mouth daily. Start taking on:  06/05/2018 What changed:  when to take this   warfarin 2 MG tablet Commonly known as:  COUMADIN Take 2 tablets (4 mg total) by mouth daily at 6 PM. As directed by coumadin clinic      Follow-up Information    [name], MD Follow up on 06/25/2018.   Specialty:  Cardiothoracic Surgery Why:  Appointment is at 1:30, please get CXR at 1:00 at [place] located on first floor of our office building Contact information: [place] [address] [phone]        Adrian Prows, MD Follow up.   Specialty:  Cardiology Why:  Appointment for blood test to check  Coumadin dosing on 06/06/2018 at 1 PM at Dr. Irven Shelling office.  Appointment to see Dr. [name] on June 25, 2018 at 4 PM. Contact information: [address] [place] [address] [phone]           Signed: Veron Giovanni PA-C 06/04/2018, 12:20 PM

## 2018-05-31 NOTE — Progress Notes (Signed)
      [address]       [place] [address]             [phone]      POD # 1 AVR CABG x 1  Up in chair  BP (!) 145/62   Pulse 79   Temp 98.8 F (37.1 C) (Oral)   Resp (!) 25   Ht 5\' 8"  (1.727 m)   Wt 97.2 kg   SpO2 95%   BMI 32.58 kg/m   Intake/Output Summary (Last 24 hours) at 05/31/2018 1827 Last data filed at 05/31/2018 1600 Gross per 24 hour  Intake 4209.75 ml  Output 2245 ml  Net 1964.75 ml   K= 3.7, creatinine 1.08 CBG well controlled  Cody Lipps C. Roxan Hockey, Cody Phelps [facility] [phone]

## 2018-05-31 NOTE — Progress Notes (Addendum)
[facility] DAILY ICU PROGRESS NOTE                   [address]            [place] [address]          [phone]   1 Day Post-Op Procedure(s) (LRB): AORTIC VALVE REPLACEMENT (AVR) using Inspiris Valve, Size 23 (N/A) CORONARY ARTERY BYPASS GRAFTING (CABG) x one using left internal mammary artery (N/A) MAZE (N/A) TRANSESOPHAGEAL ECHOCARDIOGRAM (TEE) (N/A)  Total Length of Stay:  LOS: 1 day   Subjective:  [name] is doing okay this morning.  He denies N/V.  Has some pain, but its controlled with pain medication.  Objective: Vital signs in last 24 hours: Temp:  [97.9 F (36.6 C)-100.2 F (37.9 C)] 98.4 F (36.9 C) (10/31 0700) Pulse Rate:  [79-91] 88 (10/31 0700) Cardiac Rhythm: A-V Sequential paced (10/31 0400) Resp:  [4-30] 23 (10/31 0700) BP: (87-132)/(55-77) 111/64 (10/31 0700) SpO2:  [93 %-100 %] 94 % (10/31 0700) Arterial Line BP: (80-157)/(41-71) 134/50 (10/31 0700) FiO2 (%):  [36 %-50 %] 36 % (10/30 1806) Weight:  [97.2 kg] 97.2 kg (10/31 0500)  Filed Weights   05/30/18 0604 05/31/18 0500  Weight: 89.8 kg 97.2 kg    Weight change: 7.394 kg   Hemodynamic parameters for last 24 hours: PAP: (32-51)/(17-30) 47/22 CO:  [2.6 L/min-4.2 L/min] 4.2 L/min CI:  [1.3 L/min/m2-2.1 L/min/m2] 2 L/min/m2  Intake/Output from previous day: 10/30 0701 - 10/31 0700 In: 8793.6 [P.O.:720; I.V.:4456.1; Blood:2067; IV Piggyback:1550.5] Out: 5102 [Urine:2890; Blood:900; Chest Tube:985]  Current Meds: Scheduled Meds: . acetaminophen  1,000 mg Oral Q6H  . aspirin EC  325 mg Oral Daily  . atorvastatin  80 mg Oral QPM  . bisacodyl  10 mg Oral Daily   Or  . bisacodyl  10 mg Rectal Daily  . Chlorhexidine Gluconate Cloth  6 each Topical Daily  . colesevelam  1,875 mg Oral Daily  . docusate sodium  200 mg Oral Daily  . donepezil  10 mg Oral q morning - 10a  . insulin regular  0-10 Units Intravenous TID WC  . mouth rinse  15 mL Mouth Rinse BID  . memantine  5 mg  Oral BID  . mirabegron ER  50 mg Oral Daily  . mupirocin ointment  1 application Nasal BID  . [START ON 06/01/2018] pantoprazole  40 mg Oral Daily  . rOPINIRole  2 mg Oral BID  . sodium chloride flush  3 mL Intravenous Q12H  . tamsulosin  0.4 mg Oral QPC supper   Continuous Infusions: . sodium chloride Stopped (05/30/18 1548)  . sodium chloride    . sodium chloride 10 mL/hr at 05/31/18 0100  . albumin human 12.5 g (05/30/18 2339)  . cefUROXime (ZINACEF)  IV 1.5 g (05/31/18 0539)  . insulin 0.4 mL/hr at 05/31/18 0700  . lactated ringers    . lactated ringers    . lactated ringers 20 mL/hr at 05/31/18 0700  . milrinone 0.3 mcg/kg/min (05/31/18 0700)  . phenylephrine (NEO-SYNEPHRINE) Adult infusion 40 mcg/min (05/31/18 0700)   PRN Meds:.sodium chloride, albumin human, lactated ringers, metoprolol tartrate, morphine injection, ondansetron (ZOFRAN) IV, oxyCODONE, sodium chloride flush, traMADol  General appearance: alert, cooperative and no distress Heart: regular rate and rhythm and paced Lungs: clear to auscultation bilaterally Abdomen: soft, non-tender; bowel sounds normal; no masses,  no organomegaly Extremities: edema trace to 1+ Wound: aquacel in place on sternotomy  Lab Results: CBC:  Recent Labs    05/30/18 2058 05/30/18 2101 05/31/18 0354  WBC 8.8  --  10.4  HGB 10.8* 9.9* 9.7*  HCT 34.3* 29.0* 30.6*  PLT 107*  --  116*   BMET:  Recent Labs    05/28/18 1345  05/30/18 2101 05/31/18 0354  NA 138   < > 142 139  K 3.5   < > 4.6 4.2  CL 105   < > 110 112*  CO2 22  --   --  21*  GLUCOSE 121*   < > 107* 112*  BUN 15   < > 11 13  CREATININE 0.90   < > 1.00 1.07  CALCIUM 8.8*  --   --  7.9*   < > = values in this interval not displayed.    CMET: Lab Results  Component Value Date   WBC 10.4 05/31/2018   HGB 9.7 (L) 05/31/2018   HCT 30.6 (L) 05/31/2018   PLT 116 (L) 05/31/2018   GLUCOSE 112 (H) 05/31/2018   CHOL 151 03/01/2018   TRIG 69 03/01/2018   HDL 55  03/01/2018   LDLCALC 82 03/01/2018   ALT 23 05/28/2018   AST 18 05/28/2018   NA 139 05/31/2018   K 4.2 05/31/2018   CL 112 (H) 05/31/2018   CREATININE 1.07 05/31/2018   BUN 13 05/31/2018   CO2 21 (L) 05/31/2018   PSA 2.61 06/07/2006   INR 1.44 05/30/2018   HGBA1C 5.8 (H) 05/28/2018      PT/INR:  Recent Labs    05/30/18 1505  LABPROT 17.4*  INR 1.44   Radiology: Dg Chest Port 1 View  Result Date: 05/30/2018 CLINICAL DATA:  Follow-up atelectasis. EXAM: PORTABLE CHEST 1 VIEW COMPARISON:  Chest radiograph performed 05/28/2018 FINDINGS: The patient's endotracheal tube is seen ending 2-3 cm above the carina. An enteric tube is noted ending overlying the body of the stomach. A right IJ Swan-Ganz catheter is noted ending at the right main pulmonary artery. Two left-sided chest tubes and a mediastinal drain are seen. The patient is status post median sternotomy. The lungs are hypoexpanded, with minimal left basilar atelectasis. No pleural effusion or pneumothorax is seen. The cardiomediastinal silhouette is borderline normal in size. No acute osseous abnormalities are identified. IMPRESSION: 1. Endotracheal tube seen ending 2-3 cm above the carina. Remaining tubes and lines as described above. 2. Lungs hypoexpanded, with minimal left basilar atelectasis. Electronically Signed   By: Garald Balding M.D.   On: 05/30/2018 15:29     Assessment/Plan: S/P Procedure(s) (LRB): AORTIC VALVE REPLACEMENT (AVR) using Inspiris Valve, Size 23 (N/A) CORONARY ARTERY BYPASS GRAFTING (CABG) x one using left internal mammary artery (N/A) MAZE (N/A) TRANSESOPHAGEAL ECHOCARDIOGRAM (TEE) (N/A)  1. CV- currently paced, EKG is Sinus Bradycardia with 1st degree AV block- will not start BB for now, wean Milrinone and Neo as tolerated 2. Pulm- no acute issues, CXR is without significant pleural effusions, pneumothorax, 950 cc CT output since surgery, will leave chest tubes in place today 3. Renal- 1.07, weight is  elevated about 16 pounds since surgery, will need lasix, but will not start till off Neo  4. Expected post operative blood loss anemia, mild hgb at 9.7, did receive 2 units in OR 5. Expected post operative thrombocytopenia, mild at 116, monitor 6. CBGs controlled, stop insulin drip, start levemir and SSIP 7. Dispo- patient stable, no BB with underlying bradycardia and 1st degree AV block, leave chest tubes in place today, POD #1 progression  Junie Panning Barrett 05/31/2018 8:02 AM   I have seen and examined the patient and agree with the assessment and plan as outlined.  [name], MD 05/31/2018

## 2018-05-31 NOTE — Plan of Care (Signed)
Patient resting in bed, wife at bedside.  States moderate pain, see MAR.  Encouraged to utilize ISP and cough/deep breathe.  Explained that once the chest tubes are removed that some of the pressure will subside.  Will continue to monitor.  Titrating Milrinone gtt as specified.

## 2018-06-01 ENCOUNTER — Inpatient Hospital Stay (HOSPITAL_COMMUNITY): Payer: Medicare Other

## 2018-06-01 LAB — CBC
HCT: 28.1 % — ABNORMAL LOW (ref 39.0–52.0)
Hemoglobin: 9 g/dL — ABNORMAL LOW (ref 13.0–17.0)
MCH: 28.6 pg (ref 26.0–34.0)
MCHC: 32 g/dL (ref 30.0–36.0)
MCV: 89.2 fL (ref 80.0–100.0)
Platelets: 97 10*3/uL — ABNORMAL LOW (ref 150–400)
RBC: 3.15 MIL/uL — ABNORMAL LOW (ref 4.22–5.81)
RDW: 15.9 % — ABNORMAL HIGH (ref 11.5–15.5)
WBC: 9.5 10*3/uL (ref 4.0–10.5)
nRBC: 0 % (ref 0.0–0.2)

## 2018-06-01 LAB — GLUCOSE, CAPILLARY
Glucose-Capillary: 111 mg/dL — ABNORMAL HIGH (ref 70–99)
Glucose-Capillary: 125 mg/dL — ABNORMAL HIGH (ref 70–99)
Glucose-Capillary: 126 mg/dL — ABNORMAL HIGH (ref 70–99)
Glucose-Capillary: 138 mg/dL — ABNORMAL HIGH (ref 70–99)
Glucose-Capillary: 144 mg/dL — ABNORMAL HIGH (ref 70–99)

## 2018-06-01 LAB — BASIC METABOLIC PANEL
Anion gap: 10 (ref 5–15)
BUN: 18 mg/dL (ref 8–23)
CO2: 21 mmol/L — ABNORMAL LOW (ref 22–32)
Calcium: 8.2 mg/dL — ABNORMAL LOW (ref 8.9–10.3)
Chloride: 107 mmol/L (ref 98–111)
Creatinine, Ser: 1.12 mg/dL (ref 0.61–1.24)
GFR calc Af Amer: >60 mL/min (ref >60)
GFR calc non Af Amer: >60 mL/min (ref >60)
Glucose, Bld: 146 mg/dL — ABNORMAL HIGH (ref 70–99)
Potassium: 4.6 mmol/L (ref 3.5–5.1)
Sodium: 138 mmol/L (ref 135–145)

## 2018-06-01 LAB — COOXEMETRY PANEL
Carboxyhemoglobin: 1.8 % — ABNORMAL HIGH (ref 0.5–1.5)
Methemoglobin: 1.1 % (ref 0.0–1.5)
O2 Saturation: 75.4 %
Total hemoglobin: 9.5 g/dL — ABNORMAL LOW (ref 12.0–16.0)

## 2018-06-01 MED ORDER — FUROSEMIDE 10 MG/ML IJ SOLN
40.0000 mg | Freq: Once | INTRAMUSCULAR | Status: AC
  Administered 2018-06-01: 40 mg via INTRAVENOUS
  Filled 2018-06-01: qty 4

## 2018-06-01 MED ORDER — SPIRONOLACTONE 12.5 MG HALF TABLET
12.5000 mg | ORAL_TABLET | Freq: Every day | ORAL | Status: DC
  Administered 2018-06-01 – 2018-06-04 (×4): 12.5 mg via ORAL
  Filled 2018-06-01 (×4): qty 1

## 2018-06-01 MED ORDER — SODIUM CHLORIDE 0.9% FLUSH
3.0000 mL | Freq: Two times a day (BID) | INTRAVENOUS | Status: DC
  Administered 2018-06-01 – 2018-06-03 (×5): 3 mL via INTRAVENOUS

## 2018-06-01 MED ORDER — MOVING RIGHT ALONG BOOK
Freq: Once | Status: AC
  Administered 2018-06-01: 16:00:00
  Filled 2018-06-01: qty 1

## 2018-06-01 MED ORDER — AMIODARONE HCL 200 MG PO TABS
200.0000 mg | ORAL_TABLET | Freq: Every day | ORAL | Status: DC
  Administered 2018-06-01 – 2018-06-04 (×4): 200 mg via ORAL
  Filled 2018-06-01 (×4): qty 1

## 2018-06-01 MED ORDER — WARFARIN - PHYSICIAN DOSING INPATIENT: Freq: Every day | Status: DC

## 2018-06-01 MED ORDER — ROPINIROLE HCL 1 MG PO TABS: 2.0000 mg | ORAL_TABLET | Freq: Two times a day (BID) | ORAL | Status: DC

## 2018-06-01 MED ORDER — SODIUM CHLORIDE 0.9 % IV SOLN: 250.0000 mL | INTRAVENOUS | Status: DC | PRN

## 2018-06-01 MED ORDER — ROPINIROLE HCL 1 MG PO TABS
2.0000 mg | ORAL_TABLET | Freq: Every day | ORAL | Status: DC
  Administered 2018-06-01 – 2018-06-03 (×4): 2 mg via ORAL
  Filled 2018-06-01 (×4): qty 2

## 2018-06-01 MED ORDER — ASPIRIN EC 81 MG PO TBEC
81.0000 mg | DELAYED_RELEASE_TABLET | Freq: Every day | ORAL | Status: DC
  Administered 2018-06-01 – 2018-06-04 (×4): 81 mg via ORAL
  Filled 2018-06-01 (×4): qty 1

## 2018-06-01 MED ORDER — POTASSIUM CHLORIDE CRYS ER 20 MEQ PO TBCR
20.0000 meq | EXTENDED_RELEASE_TABLET | Freq: Two times a day (BID) | ORAL | Status: DC
  Administered 2018-06-01 – 2018-06-03 (×5): 20 meq via ORAL
  Filled 2018-06-01 (×6): qty 1

## 2018-06-01 MED ORDER — INSULIN ASPART 100 UNIT/ML ~~LOC~~ SOLN
0.0000 [IU] | Freq: Three times a day (TID) | SUBCUTANEOUS | Status: DC
  Administered 2018-06-01 – 2018-06-04 (×5): 2 [IU] via SUBCUTANEOUS

## 2018-06-01 MED ORDER — LISINOPRIL 2.5 MG PO TABS
2.5000 mg | ORAL_TABLET | Freq: Every day | ORAL | Status: DC
  Administered 2018-06-01 – 2018-06-04 (×4): 2.5 mg via ORAL
  Filled 2018-06-01 (×4): qty 1

## 2018-06-01 MED ORDER — WARFARIN SODIUM 2 MG PO TABS
2.0000 mg | ORAL_TABLET | Freq: Every day | ORAL | Status: DC
  Administered 2018-06-01 – 2018-06-02 (×2): 2 mg via ORAL
  Filled 2018-06-01 (×2): qty 1

## 2018-06-01 MED ORDER — TORSEMIDE 20 MG PO TABS
20.0000 mg | ORAL_TABLET | Freq: Two times a day (BID) | ORAL | Status: DC
  Administered 2018-06-02 – 2018-06-03 (×3): 20 mg via ORAL
  Filled 2018-06-01 (×3): qty 1

## 2018-06-01 MED ORDER — SODIUM CHLORIDE 0.9% FLUSH: 3.0000 mL | INTRAVENOUS | Status: DC | PRN

## 2018-06-01 MED ORDER — INSULIN ASPART 100 UNIT/ML ~~LOC~~ SOLN: 0.0000 [IU] | Freq: Three times a day (TID) | SUBCUTANEOUS | Status: DC

## 2018-06-01 MED ORDER — FERROUS SULFATE 325 (65 FE) MG PO TABS
325.0000 mg | ORAL_TABLET | Freq: Three times a day (TID) | ORAL | Status: DC
  Administered 2018-06-02 – 2018-06-04 (×7): 325 mg via ORAL
  Filled 2018-06-01 (×7): qty 1

## 2018-06-01 MED FILL — Mannitol IV Soln 20%: INTRAVENOUS | Qty: 1000 | Status: AC

## 2018-06-01 MED FILL — Lidocaine HCl Local Preservative Free (PF) Inj 1%: INTRAMUSCULAR | Qty: 10 | Status: AC

## 2018-06-01 MED FILL — Calcium Chloride Inj 10%: INTRAVENOUS | Qty: 10 | Status: AC

## 2018-06-01 MED FILL — Heparin Sodium (Porcine) Inj 1000 Unit/ML: INTRAMUSCULAR | Qty: 30 | Status: AC

## 2018-06-01 MED FILL — Electrolyte-R (PH 7.4) Solution: INTRAVENOUS | Qty: 1000 | Status: AC

## 2018-06-01 MED FILL — Sodium Chloride IV Soln 0.9%: INTRAVENOUS | Qty: 2000 | Status: AC

## 2018-06-01 MED FILL — Sodium Bicarbonate IV Soln 8.4%: INTRAVENOUS | Qty: 50 | Status: AC

## 2018-06-01 MED FILL — Heparin Sodium (Porcine) Inj 1000 Unit/ML: INTRAMUSCULAR | Qty: 60 | Status: AC

## 2018-06-01 NOTE — Progress Notes (Signed)
Patient arrived from [facility] to room 4E.  Telemetry monitor applied and CCMD notified.  Patient oriented to unit and room.  Will continue to monitor.

## 2018-06-01 NOTE — Evaluation (Signed)
Physical Therapy Evaluation Patient Details Name: Cody Phelps MRN: [id number] DOB: 17-Jan-1941 Today's Date: 06/01/2018   History of Present Illness  Pt s/p AVR, CABG x 1, and MAZE procedure on 10/30. PMH - aortic stenosis, CAD, htn, dm, afib, OA, prostate CA  Clinical Impression  Pt admitted with above diagnosis and presents to PT with functional limitations due to deficits listed below (See PT problem list). Pt needs skilled PT to maximize independence and safety to allow discharge to home with wife's support. Anticipate pt will make good progress and be mobilizing at level of supervision or higher.      Follow Up Recommendations Home health PT;Supervision/Assistance - 24 hour    Equipment Recommendations  None recommended by PT    Recommendations for Other Services       Precautions / Restrictions Precautions Precautions: Sternal      Mobility  Bed Mobility Overal bed mobility: Needs Assistance Bed Mobility: Supine to Sit     Supine to sit: Mod assist     General bed mobility comments: assist to elevate trunk into sitting and bring hips to EOB  Transfers Overall transfer level: Needs assistance Equipment used: 4-wheeled walker Transfers: Sit to/from Stand Sit to Stand: Min assist         General transfer comment: Assist to bring hips up. Verbal cues for hand placement to knees  Ambulation/Gait Ambulation/Gait assistance: Min guard Gait Distance (Feet): 175 Feet Assistive device: 4-wheeled walker Gait Pattern/deviations: Step-through pattern;Decreased stride length;Trunk flexed Gait velocity: decr Gait velocity interpretation: 1.31 - 2.62 ft/sec, indicative of limited community ambulator General Gait Details: assist for balance and safety. Verbal cues to stand more erect  Stairs            Wheelchair Mobility    Modified Rankin (Stroke Patients Only)       Balance Overall balance assessment: Needs assistance Sitting-balance support: No upper  extremity supported;Feet supported Sitting balance-Leahy Scale: Fair     Standing balance support: Bilateral upper extremity supported Standing balance-Leahy Scale: Poor Standing balance comment: rollator and min guard for static standing                             Pertinent Vitals/Pain Pain Assessment: Faces Faces Pain Scale: Hurts a little bit Pain Location: incisional Pain Descriptors / Indicators: Operative site guarding Pain Intervention(s): Limited activity within patient's tolerance    Home Living Family/patient expects to be discharged to:: Private residence Living Arrangements: Spouse/significant other;Children Available Help at Discharge: Family;Available 24 hours/day Type of Home: House Home Access: Stairs to enter Entrance Stairs-Rails: Right Entrance Stairs-Number of Steps: 3 Home Layout: One level Home Equipment: Walker - 2 wheels;Cane - single point;Bedside commode;Shower seat - built in      Prior Function Level of Independence: Independent         Comments: Limited by SOB     Hand Dominance   Dominant Hand: Right    Extremity/Trunk Assessment   Upper Extremity Assessment Upper Extremity Assessment: RUE deficits/detail;LUE deficits/detail RUE Deficits / Details: limited by sternal precautions LUE Deficits / Details: limited by sternal precautions    Lower Extremity Assessment Lower Extremity Assessment: Generalized weakness       Communication   Communication: No difficulties  Cognition Arousal/Alertness: Awake/alert Behavior During Therapy: WFL for tasks assessed/performed Overall Cognitive Status: Within Functional Limits for tasks assessed  General Comments      Exercises     Assessment/Plan    PT Assessment Patient needs continued PT services  PT Problem List Decreased strength;Decreased activity tolerance;Decreased balance;Decreased mobility;Decreased  knowledge of precautions;Decreased knowledge of use of DME       PT Treatment Interventions DME instruction;Gait training;Stair training;Functional mobility training;Therapeutic activities;Therapeutic exercise;Balance training;Patient/family education    PT Goals (Current goals can be found in the Care Plan section)  Acute Rehab PT Goals Patient Stated Goal: return home PT Goal Formulation: With patient Time For Goal Achievement: 06/15/18 Potential to Achieve Goals: Good    Frequency Min 3X/week   Barriers to discharge Inaccessible home environment stairs to enter    Co-evaluation               AM-PAC PT "6 Clicks" Daily Activity  Outcome Measure Difficulty turning over in bed (including adjusting bedclothes, sheets and blankets)?: Unable Difficulty moving from lying on back to sitting on the side of the bed? : Unable Difficulty sitting down on and standing up from a chair with arms (e.g., wheelchair, bedside commode, etc,.)?: Unable Help needed moving to and from a bed to chair (including a wheelchair)?: A Little Help needed walking in hospital room?: A Little Help needed climbing 3-5 steps with a railing? : A Lot 6 Click Score: 11    End of Session Equipment Utilized During Treatment: Gait belt Activity Tolerance: Patient tolerated treatment well Patient left: in chair;with call bell/phone within reach;with family/visitor present Nurse Communication: Mobility status PT Visit Diagnosis: Other abnormalities of gait and mobility (R26.89);Muscle weakness (generalized) (M62.81)    Time: 4709-6283 PT Time Calculation (min) (ACUTE ONLY): 18 min   Charges:   PT Evaluation $PT Eval Moderate Complexity: [place] Pager [phone] Office [place] 06/01/2018, 3:32 PM

## 2018-06-01 NOTE — Progress Notes (Addendum)
[facility] DAILY ICU PROGRESS NOTE                   [address]            [place] [address]          [phone]   2 Days Post-Op Procedure(s) (LRB): AORTIC VALVE REPLACEMENT (AVR) using Inspiris Valve, Size 23 (N/A) CORONARY ARTERY BYPASS GRAFTING (CABG) x one using left internal mammary artery (N/A) MAZE (N/A) TRANSESOPHAGEAL ECHOCARDIOGRAM (TEE) (N/A)  Total Length of Stay:  LOS: 2 days   Subjective:  Patient is sleepy this morning, was able to rest off and on overnight.  He has ambulated around the unit.    Objective: Vital signs in last 24 hours: Temp:  [98.2 F (36.8 C)-98.8 F (37.1 C)] 98.2 F (36.8 C) (11/01 0400) Pulse Rate:  [79-80] 80 (11/01 0700) Cardiac Rhythm: A-V Sequential paced (10/31 2000) Resp:  [8-31] 18 (11/01 0700) BP: (104-145)/(55-69) 128/61 (11/01 0700) SpO2:  [92 %-96 %] 92 % (11/01 0700) Arterial Line BP: (114-166)/(41-54) 159/48 (10/31 1400)  Filed Weights   05/30/18 0604 05/31/18 0500  Weight: 89.8 kg 97.2 kg    Weight change:    Hemodynamic parameters for last 24 hours: PAP: (35-40)/(17) 35/17  Intake/Output from previous day: 10/31 0701 - 11/01 0700 In: 734.5 [I.V.:602.6; IV Piggyback:131.9] Out: 1515 [Urine:1055; Chest Tube:460]  Current Meds: Scheduled Meds: . acetaminophen  1,000 mg Oral Q6H  . aspirin EC  325 mg Oral Daily  . atorvastatin  80 mg Oral QPM  . bisacodyl  10 mg Oral Daily   Or  . bisacodyl  10 mg Rectal Daily  . Chlorhexidine Gluconate Cloth  6 each Topical Daily  . colesevelam  1,875 mg Oral Daily  . docusate sodium  200 mg Oral Daily  . donepezil  10 mg Oral q morning - 10a  . furosemide  20 mg Intravenous BID  . insulin aspart  0-24 Units Subcutaneous Q4H  . insulin detemir  20 Units Subcutaneous Daily  . mouth rinse  15 mL Mouth Rinse BID  . memantine  5 mg Oral BID  . mirabegron ER  50 mg Oral Daily  . mupirocin ointment  1 application Nasal BID  . pantoprazole  40 mg Oral Daily  .  rOPINIRole  2 mg Oral BID  . sodium chloride flush  3 mL Intravenous Q12H  . tamsulosin  0.4 mg Oral QPC supper   Continuous Infusions: . lactated ringers    . lactated ringers 20 mL/hr at 06/01/18 0700  . milrinone 0.125 mcg/kg/min (06/01/18 0700)   PRN Meds:.metoprolol tartrate, morphine injection, ondansetron (ZOFRAN) IV, oxyCODONE, sodium chloride flush, traMADol  General appearance: alert, cooperative and no distress Heart: regular rate and rhythm Lungs: diminished breath sounds bibasilar Abdomen: soft, non-tender; bowel sounds normal; no masses,  no organomegaly Extremities: edema 1+ LE, bilateral edema in hands Wound: clean and dry, aquacel remains in place on sternotomy  Lab Results: CBC: Recent Labs    05/31/18 1812 06/01/18 0317  WBC 9.4 9.5  HGB 9.3* 9.0*  HCT 29.4* 28.1*  PLT 92* 97*   BMET:  Recent Labs    05/31/18 1715 06/01/18 0317  NA 137 138  K 3.7 4.6  CL 110 107  CO2 20* 21*  GLUCOSE 136* 146*  BUN 14 18  CREATININE 1.08 1.12  CALCIUM 7.8* 8.2*    CMET: Lab Results  Component Value Date   WBC 9.5 06/01/2018  HGB 9.0 (L) 06/01/2018   HCT 28.1 (L) 06/01/2018   PLT 97 (L) 06/01/2018   GLUCOSE 146 (H) 06/01/2018   CHOL 151 03/01/2018   TRIG 69 03/01/2018   HDL 55 03/01/2018   LDLCALC 82 03/01/2018   ALT 23 05/28/2018   AST 18 05/28/2018   NA 138 06/01/2018   K 4.6 06/01/2018   CL 107 06/01/2018   CREATININE 1.12 06/01/2018   BUN 18 06/01/2018   CO2 21 (L) 06/01/2018   PSA 2.61 06/07/2006   INR 1.44 05/30/2018   HGBA1C 5.8 (H) 05/28/2018      PT/INR:  Recent Labs    05/30/18 1505  LABPROT 17.4*  INR 1.44   Radiology: No results found.   Assessment/Plan: S/P Procedure(s) (LRB): AORTIC VALVE REPLACEMENT (AVR) using Inspiris Valve, Size 23 (N/A) CORONARY ARTERY BYPASS GRAFTING (CABG) x one using left internal mammary artery (N/A) MAZE (N/A) TRANSESOPHAGEAL ECHOCARDIOGRAM (TEE) (N/A)  1. CV- NSR with 1st degree AV  block, can tape pacing wires, no BB, on Milrinone at 0.125 can likely d/c today, will discuss with Dr. Roxy Manns, coox is 75 2. Pulm- off oxygen, no acute issues, continue IS, can likely d/c chest tubes today, will confirm with Dr. Roxy Manns 3. Renal- creatinine remains stable, weight remains elevated, visibly edematous on exam 4. Expected post operative anemia, mild Hgb at 9.0 5. Expected thrombocytopenia, remain stable 6. DM- sugars controlled, continue current insulin regimen, will adjust as diet intake improves 7. Dispo- patient in NSR with 1st degree AV block, can turn off pacer, hold BB, can likely d/c Milrinone, will discuss chest tube removal with Dr. Roxy Manns, IV diuretics again today, with significant edema on exam, continue current care     Erin Barrett 06/01/2018 7:52 AM    I have seen and examined the patient and agree with the assessment and plan as outlined.  Doing very well POD2.  Maintaining NSR w/ stable BP.  Breathing comfortably w/ O2 sats 94-96% and CXR looks good.  Acute on chronic combined systolic and diastolic CHF with expected post-op volume excess, needs diuresis.  Chronic anemia w/ expected post op acute blood loss anemia, stable.  OSA on CPAP at home - didn't get to use it last night.  Limited mobility and physical deconditioning - needs PT consult.  Will d/c tubes and Foley.  Mobilize.  Restart amiodarone and consider restarting beta blocker over the next few days depending on HR and BP.  Stop milrinone and start ACE-I.  Start Coumadin.  Lasix IV today. Transfer step down.    [name], MD 06/01/2018 9:06 AM

## 2018-06-01 NOTE — Plan of Care (Signed)
  Problem: Education: Goal: Knowledge of General Education information will improve Description Including pain rating scale, medication(s)/side effects and non-pharmacologic comfort measures Outcome: Progressing   Problem: Clinical Measurements: Goal: Diagnostic test results will improve Outcome: Progressing Goal: Respiratory complications will improve Outcome: Progressing   Problem: Coping: Goal: Level of anxiety will decrease Outcome: Progressing   Problem: Elimination: Goal: Will not experience complications related to bowel motility Outcome: Progressing   Problem: Pain Managment: Goal: General experience of comfort will improve Outcome: Progressing   Problem: Nutrition: Goal: Adequate nutrition will be maintained Outcome: Not Progressing   Problem: Elimination: Goal: Will not experience complications related to urinary retention Outcome: Completed/Met

## 2018-06-02 ENCOUNTER — Inpatient Hospital Stay (HOSPITAL_COMMUNITY): Payer: Medicare Other

## 2018-06-02 LAB — CBC
HCT: 26.1 % — ABNORMAL LOW (ref 39.0–52.0)
Hemoglobin: 8.4 g/dL — ABNORMAL LOW (ref 13.0–17.0)
MCH: 28.7 pg (ref 26.0–34.0)
MCHC: 32.2 g/dL (ref 30.0–36.0)
MCV: 89.1 fL (ref 80.0–100.0)
Platelets: 94 K/uL — ABNORMAL LOW (ref 150–400)
RBC: 2.93 MIL/uL — ABNORMAL LOW (ref 4.22–5.81)
RDW: 15.9 % — ABNORMAL HIGH (ref 11.5–15.5)
WBC: 7.7 K/uL (ref 4.0–10.5)
nRBC: 0 % (ref 0.0–0.2)

## 2018-06-02 LAB — GLUCOSE, CAPILLARY
Glucose-Capillary: 107 mg/dL — ABNORMAL HIGH (ref 70–99)
Glucose-Capillary: 144 mg/dL — ABNORMAL HIGH (ref 70–99)
Glucose-Capillary: 99 mg/dL (ref 70–99)
Glucose-Capillary: 109 mg/dL — ABNORMAL HIGH (ref 70–99)

## 2018-06-02 LAB — BASIC METABOLIC PANEL WITH GFR
Anion gap: 5 (ref 5–15)
BUN: 19 mg/dL (ref 8–23)
CO2: 21 mmol/L — ABNORMAL LOW (ref 22–32)
Calcium: 8 mg/dL — ABNORMAL LOW (ref 8.9–10.3)
Chloride: 110 mmol/L (ref 98–111)
Creatinine, Ser: 0.9 mg/dL (ref 0.61–1.24)
GFR calc Af Amer: >60 mL/min
GFR calc non Af Amer: >60 mL/min
Glucose, Bld: 109 mg/dL — ABNORMAL HIGH (ref 70–99)
Potassium: 4 mmol/L (ref 3.5–5.1)
Sodium: 136 mmol/L (ref 135–145)

## 2018-06-02 LAB — PROTIME-INR
INR: 1.29
Prothrombin Time: 15.9 seconds — ABNORMAL HIGH (ref 11.4–15.2)

## 2018-06-02 MED ORDER — ROPINIROLE HCL 1 MG PO TABS
1.0000 mg | ORAL_TABLET | ORAL | Status: DC | PRN
  Administered 2018-06-02 – 2018-06-03 (×2): 1 mg via ORAL
  Filled 2018-06-02 (×2): qty 1

## 2018-06-02 NOTE — Progress Notes (Signed)
CARDIAC REHAB PHASE I   PRE:  Rate/Rhythm: 77 SR  BP:  Supine:   Sitting: 122/60  Standing:    SaO2: 94 RA  MODE:  Ambulation: 500 ft   POST:  Rate/Rhythm: 100 SR  BP:  Supine:   Sitting: 154/81  Standing:    SaO2: 95 RA 0945-1030 Assisted X 1 and used walker to ambulate. Gait slow but steady with walker. Pt walks leaning forward with back bent. Pt waked 500 feet with multiple short standing rest stops. Pt tired by end of walk. VS stable. Pt to recliner after walk with call light in reach and family present.I encouraged two more walks today and using IS 10 times hourly.   Rodney Langton RN 06/02/2018 10:29 AM

## 2018-06-02 NOTE — Discharge Instructions (Addendum)
Aortic Valve Replacement, Care After °Refer to this sheet in the next few weeks. These instructions provide you with information about caring for yourself after your procedure. Your health care provider may also give you more specific instructions. Your treatment has been planned according to current medical practices, but problems sometimes occur. Call your health care provider if you have any problems or questions after your procedure. °What can I expect after the procedure? °After the procedure, it is common to have: °· Pain around your incision area. °· A small amount of blood or clear fluid coming from your incision. ° °Follow these instructions at home: °Eating and drinking ° °· Follow instructions from your health care provider about eating or drinking restrictions. °? Limit alcohol intake to no more than 1 drink per day for nonpregnant women and 2 drinks per day for men. One drink equals 12 oz of beer, 5 oz of wine, or 1½ oz of hard liquor. °? Limit how much caffeine you drink. Caffeine can affect your heart's rate and rhythm. °· Drink enough fluid to keep your urine clear or pale yellow. °· Eat a heart-healthy diet. This should include plenty of fresh fruits and vegetables. If you eat meat, it should be lean cuts. Avoid foods that are: °? High in salt, saturated fat, or sugar. °? Canned or highly processed. °? Fried. °Activity °· Return to your normal activities as told by your health care provider. Ask your health care provider what activities are safe for you. °· Exercise regularly once you have recovered, as told by your health care provider. °· Avoid sitting for more than 2 hours at a time without moving. Get up and move around at least once every 1-2 hours. This helps to prevent blood clots in the legs. °· Do not lift anything that is heavier than 10 lb (4.5 kg) until your health care provider approves. °· Avoid pushing or pulling things with your arms until your health care provider approves. This  includes pulling on handrails to help you climb stairs. °Incision care ° °· Follow instructions from your health care provider about how to take care of your incision. Make sure you: °? Wash your hands with soap and water before you change your bandage (dressing). If soap and water are not available, use hand sanitizer. °? Change your dressing as told by your health care provider. °? Leave stitches (sutures), skin glue, or adhesive strips in place. These skin closures may need to stay in place for 2 weeks or longer. If adhesive strip edges start to loosen and curl up, you may trim the loose edges. Do not remove adhesive strips completely unless your health care provider tells you to do that. °· Check your incision area every day for signs of infection. Check for: °? More redness, swelling, or pain. °? More fluid or blood. °? Warmth. °? Pus or a bad smell. °Medicines °· Take over-the-counter and prescription medicines only as told by your health care provider. °· If you were prescribed an antibiotic medicine, take it as told by your health care provider. Do not stop taking the antibiotic even if you start to feel better. °Travel °· Avoid airplane travel for as long as told by your health care provider. °· When you travel, bring a list of your medicines and a record of your medical history with you. Carry your medicines with you. °Driving °· Ask your health care provider when it is safe for you to drive. Do not drive until your health   care provider approves.  Do not drive or operate heavy machinery while taking prescription pain medicine. Lifestyle   Do not use any tobacco products, such as cigarettes, chewing tobacco, or e-cigarettes. If you need help quitting, ask your health care provider.  Resume sexual activity as told by your health care provider. Do not use medicines for erectile dysfunction unless your health care provider approves, if this applies.  Work with your health care provider to keep your  blood pressure and cholesterol under control, and to manage any other heart conditions that you have.  Maintain a healthy weight. General instructions  Do not take baths, swim, or use a hot tub until your health care provider approves.  Do not strain to have a bowel movement.  Avoid crossing your legs while sitting down.  Check your temperature every day for a fever. A fever may be a sign of infection.  If you are a woman and you plan to become pregnant, talk with your health care provider before you become pregnant.  Wear compression stockings if your health care provider instructs you to do this. These stockings help to prevent blood clots and reduce swelling in your legs.  Tell all health care providers who care for you that you have an artificial (prosthetic) aortic valve. If you have or have had heart disease or endocarditis, tell all health care providers about these conditions as well.  Keep all follow-up visits as told by your health care provider. This is important. Contact a health care provider if:  You develop a skin rash.  You experience sudden, unexplained changes in your weight.  You have more redness, swelling, or pain around your incision.  You have more fluid or blood coming from your incision.  Your incision feels warm to the touch.  You have pus or a bad smell coming from your incision.  You have a fever. Get help right away if:  You develop chest pain that is different from the pain coming from your incision.  You develop shortness of breath or difficulty breathing.  You start to feel light-headed. These symptoms may represent a serious problem that is an emergency. Do not wait to see if the symptoms will go away. Get medical help right away. Call your local emergency services (911 in the [place] Do not drive yourself to the hospital. This information is not intended to replace advice given to you by your health care provider. Make sure you discuss any  questions you have with your health care provider. Document Released: 02/03/2005 Document Revised: 12/24/2015 Document Reviewed: 06/21/2015 Elsevier Interactive Patient Education  2017 [employer] Coronary Artery Bypass Grafting, Care After These instructions give you information on caring for yourself after your procedure. Your doctor may also give you more specific instructions. Call your doctor if you have any problems or questions after your procedure. Follow these instructions at home:  Only take medicine as told by your doctor. Take medicines exactly as told. Do not stop taking medicines or start any new medicines without talking to your doctor first.  Take your pulse as told by your doctor.  Do deep breathing as told by your doctor. Use your breathing device (incentive spirometer), if given, to practice deep breathing several times a day. Support your chest with a pillow or your arms when you take deep breaths or cough.  Keep the area clean, dry, and protected where the surgery cuts (incisions) were made. Remove bandages (dressings) only as told by your doctor. If strips  were applied to surgical area, do not take them off. They fall off on their own.  Check the surgery area daily for puffiness (swelling), redness, or leaking fluid.  If surgery cuts were made in your legs: ? Avoid crossing your legs. ? Avoid sitting for long periods of time. Change positions every 30 minutes. ? Raise your legs when you are sitting. Place them on pillows.  Wear stockings that help keep blood clots from forming in your legs (compression stockings).  Only take sponge baths until your doctor says it is okay to take showers. Pat the surgery area dry. Do not rub the surgery area with a washcloth or towel. Do not bathe, swim, or use a hot tub until your doctor says it is okay.  Eat foods that are high in fiber. These include raw fruits and vegetables, whole grains, beans, and nuts. Choose lean meats.  Avoid canned, processed, and fried foods.  Drink enough fluids to keep your pee (urine) clear or pale yellow.  Weigh yourself every day.  Rest and limit activity as told by your doctor. You may be told to: ? Stop any activity if you have chest pain, shortness of breath, changes in heartbeat, or dizziness. Get help right away if this happens. ? Move around often for short amounts of time or take short walks as told by your doctor. Gradually become more active. You may need help to strengthen your muscles and build endurance. ? Avoid lifting, pushing, or pulling anything heavier than 10 pounds (4.5 kg) for at least 6 weeks after surgery.  Do not drive until your doctor says it is okay.  Ask your doctor when you can go back to work.  Ask your doctor when you can begin sexual activity again.  Follow up with your doctor as told. Contact a doctor if:  You have puffiness, redness, more pain, or fluid draining from the incision site.  You have a fever.  You have puffiness in your ankles or legs.  You have pain in your legs.  You gain 2 or more pounds (0.9 kg) a day.  You feel sick to your stomach (nauseous) or throw up (vomit).  You have watery poop (diarrhea). Get help right away if:  You have chest pain that goes to your jaw or arms.  You have shortness of breath.  You have a fast or irregular heartbeat.  You notice a "clicking" in your breastbone when you move.  You have numbness or weakness in your arms or legs.  You feel dizzy or light-headed. This information is not intended to replace advice given to you by your health care provider. Make sure you discuss any questions you have with your health care provider. Document Released: 07/23/2013 Document Revised: 12/24/2015 Document Reviewed: 12/25/2012 Elsevier Interactive Patient Education  2017 [employer] Discharge Instructions:  1. You may shower, please wash incisions daily with soap and water and keep dry.  If you  wish to cover wounds with dressing you may do so but please keep clean and change daily.  No tub baths or swimming until incisions have completely healed.  If your incisions become red or develop any drainage please call our office at [phone]  2. No Driving until cleared by Dr. Guy Sandifer office and you are no longer using narcotic pain medications  3. Monitor your weight daily.. Please use the same scale and weigh at same time... If you gain 5-10 lbs in 48 hours with associated lower extremity swelling, please contact our  office at [phone]  4. Fever of 101.5 for at least 24 hours with no source, please contact our office at [phone]  5. Activity- up as tolerated, please walk at least 3 times per day.  Avoid strenuous activity, no lifting, pushing, or pulling with your arms over 8-10 lbs for a minimum of 6 weeks  6. If any questions or concerns arise, please do not hesitate to contact our office at [phone]  Information on my medicine - Coumadin   (Warfarin)   Why was Coumadin prescribed for you? Coumadin was prescribed for you because you have a blood clot or a medical condition that can cause an increased risk of forming blood clots. Blood clots can cause serious health problems by blocking the flow of blood to the heart, lung, or brain. Coumadin can prevent harmful blood clots from forming. As a reminder your indication for Coumadin is:   Blood Clot Prevention After Heart Valve Surgery  What test will check on my response to Coumadin? While on Coumadin (warfarin) you will need to have an INR test regularly to ensure that your dose is keeping you in the desired range. The INR (international normalized ratio) number is calculated from the result of the laboratory test called prothrombin time (PT).  If an INR APPOINTMENT HAS NOT ALREADY BEEN MADE FOR YOU please schedule an appointment to have this lab work done by your health care provider within 7 days. Your INR goal is usually  a number between:  2 to 3 or your provider may give you a more narrow range like 2-2.5.  Ask your health care provider during an office visit what your goal INR is.  What  do you need to  know  About  COUMADIN? Take Coumadin (warfarin) exactly as prescribed by your healthcare provider about the same time each day.  DO NOT stop taking without talking to the doctor who prescribed the medication.  Stopping without other blood clot prevention medication to take the place of Coumadin may increase your risk of developing a new clot or stroke.  Get refills before you run out.  What do you do if you miss a dose? If you miss a dose, take it as soon as you remember on the same day then continue your regularly scheduled regimen the next day.  Do not take two doses of Coumadin at the same time.  Important Safety Information A possible side effect of Coumadin (Warfarin) is an increased risk of bleeding. You should call your healthcare provider right away if you experience any of the following: ? Bleeding from an injury or your nose that does not stop. ? Unusual colored urine (red or dark brown) or unusual colored stools (red or black). ? Unusual bruising for unknown reasons. ? A serious fall or if you hit your head (even if there is no bleeding).  Some foods or medicines interact with Coumadin (warfarin) and might alter your response to warfarin. To help avoid this: ? Eat a balanced diet, maintaining a consistent amount of Vitamin K. ? Notify your provider about major diet changes you plan to make. ? Avoid alcohol or limit your intake to 1 drink for women and 2 drinks for men per day. (1 drink is 5 oz. wine, 12 oz. beer, or 1.5 oz. liquor.)  Make sure that ANY health care provider who prescribes medication for you knows that you are taking Coumadin (warfarin).  Also make sure the healthcare provider who is monitoring your Coumadin knows when you  have started a new medication including herbals and  non-prescription products.  Coumadin (Warfarin)  Major Drug Interactions  Increased Warfarin Effect Decreased Warfarin Effect  Alcohol (large quantities) Antibiotics (esp. Septra/Bactrim, Flagyl, Cipro) Amiodarone (Cordarone) Aspirin (ASA) Cimetidine (Tagamet) Megestrol (Megace) NSAIDs (ibuprofen, naproxen, etc.) Piroxicam (Feldene) Propafenone (Rythmol SR) Propranolol (Inderal) Isoniazid (INH) Posaconazole (Noxafil) Barbiturates (Phenobarbital) Carbamazepine (Tegretol) Chlordiazepoxide (Librium) Cholestyramine (Questran) Griseofulvin Oral Contraceptives Rifampin Sucralfate (Carafate) Vitamin K   Coumadin (Warfarin) Major Herbal Interactions  Increased Warfarin Effect Decreased Warfarin Effect  Garlic Ginseng Ginkgo biloba Coenzyme Q10 Green tea St. Johns wort    Coumadin (Warfarin) FOOD Interactions  Eat a consistent number of servings per week of foods HIGH in Vitamin K (1 serving =  cup)  Collards (cooked, or boiled & drained) Kale (cooked, or boiled & drained) Mustard greens (cooked, or boiled & drained) Parsley *serving size only =  cup Spinach (cooked, or boiled & drained) Swiss chard (cooked, or boiled & drained) Turnip greens (cooked, or boiled & drained)  Eat a consistent number of servings per week of foods MEDIUM-HIGH in Vitamin K (1 serving = 1 cup)  Asparagus (cooked, or boiled & drained) Broccoli (cooked, boiled & drained, or raw & chopped) Brussel sprouts (cooked, or boiled & drained) *serving size only =  cup Lettuce, raw (green leaf, endive, romaine) Spinach, raw Turnip greens, raw & chopped   These websites have more information on Coumadin (warfarin):  FailFactory.se; VeganReport.com.au;

## 2018-06-02 NOTE — Progress Notes (Signed)
Asked patient if he wanted to walk. Patient refused at this time.

## 2018-06-02 NOTE — Progress Notes (Addendum)
      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 Wife did inform me he take Requip 1 mg PRN in afternoon and 2 mg at night so will adjust accordingly.  Objective: Vital signs in last 24 hours: Temp:  [97.8 F (36.6 C)-99.2 F (37.3 C)] 99.2 F (37.3 C) (11/02 0350) Pulse Rate:  [71-80] 76 (11/02 0350) Cardiac Rhythm: Normal sinus rhythm (11/01 2100) Resp:  [18-25] 20 (11/02 0350) BP: (113-152)/(56-71) 124/59 (11/02 0350) SpO2:  [95 %-98 %] 98 % (11/02 0350) Weight:  [95.8 kg] 95.8 kg (11/02 0350)  Pre op weight 89.8 kg Current Weight  06/02/18 95.8 kg      Intake/Output from previous day: 11/01 0701 - 11/02 0700 In: 789.5 [P.O.:720; I.V.:69.5] Out: 1445 [Urine:1375; Chest Tube:70]   Physical Exam:  Cardiovascular: RRR Pulmonary: Diminished at bases Abdomen: Soft, non tender, bowel sounds present. Extremities: Mild bilateral lower extremity edema. Wounds: Aquacel removed and wound is clean and dry.  No erythema or signs of infection.  Lab Results: CBC: Recent Labs    05/31/18 1812 06/01/18 0317  WBC 9.4 9.5  HGB 9.3* 9.0*  HCT 29.4* 28.1*  PLT 92* 97*   BMET:  Recent Labs    05/31/18 1715 06/01/18 0317  NA 137 138  K 3.7 4.6  CL 110 107  CO2 20* 21*  GLUCOSE 136* 146*  BUN 14 18  CREATININE 1.08 1.12  CALCIUM 7.8* 8.2*    PT/INR:  Lab Results  Component Value Date   INR 1.44 05/30/2018   INR 1.11 05/28/2018   INR 1.18 03/01/2018   ABG:  INR: Will add last result for INR, ABG once components are confirmed Will add last 4 CBG results once components are confirmed  Assessment/Plan:  1. CV - Has had a  fib briefly on 4E. SR, first degree heart block with HR in the 70's this am.  On Amiodarone 200 mg daily, Lisinopril 2.5 mg daily, Spironolactone 12.5 mg daily and Coumadin. INR this am  1.29. Continue current Coumadin dose as will not see first result until am. 2.  Pulmonary - On room air. CXR this am appears stable (cardiomegaly, right base atelectasis). Encourage incentive spirometer. 3. Acute on chronic CHF - On Torsemide 20 mg bid  4.  Acute blood loss anemia - H and H 8.4 and 26.1. Continue ferrous sulfate 5. Thrombocytopenia-platelets this am 94,000 6. DM-CBGs 126/111/107. On Insulin. Pre op HGA1C 5.8. 7. Physical deconditioning-PT 8.Will remove EPW in am  Sharalyn Ink [facility] 06/02/2018,8:05 AM [phone]   Chart reviewed, patient examined, agree with above. Walking but gets tired and has to take rest breaks.

## 2018-06-03 LAB — GLUCOSE, CAPILLARY
Glucose-Capillary: 120 mg/dL — ABNORMAL HIGH (ref 70–99)
Glucose-Capillary: 130 mg/dL — ABNORMAL HIGH (ref 70–99)

## 2018-06-03 LAB — PROTIME-INR
INR: 1.16
Prothrombin Time: 14.7 seconds (ref 11.4–15.2)

## 2018-06-03 MED ORDER — WARFARIN SODIUM 4 MG PO TABS
4.0000 mg | ORAL_TABLET | Freq: Every day | ORAL | Status: DC
  Administered 2018-06-03: 4 mg via ORAL
  Filled 2018-06-03: qty 1

## 2018-06-03 MED ORDER — TORSEMIDE 20 MG PO TABS
20.0000 mg | ORAL_TABLET | Freq: Two times a day (BID) | ORAL | Status: DC
  Administered 2018-06-03: 20 mg via ORAL
  Filled 2018-06-03 (×2): qty 1

## 2018-06-03 NOTE — Progress Notes (Signed)
Patient already on CPAP and doing well. No issues at this time. Family is at bedside and will call if any further assistance needed.

## 2018-06-03 NOTE — Progress Notes (Signed)
Epicardial pacing wires removed, per order. Vitals stable and being monitored per protocol. Left site with scant serosanguinous ooze. Covered with gauze. Pt on bedrest x1 hour. Will continue to monitor.   Ara Kussmaul BSN, RN

## 2018-06-03 NOTE — Progress Notes (Addendum)
      [address]       [place] [address]             [phone]        4 Days Post-Op Procedure(s) (LRB): AORTIC VALVE REPLACEMENT (AVR) using Inspiris Valve, Size 23 (N/A) CORONARY ARTERY BYPASS GRAFTING (CABG) x one using left internal mammary artery (N/A) MAZE (N/A) TRANSESOPHAGEAL ECHOCARDIOGRAM (TEE) (N/A)  Subjective: Patient having loose stools. He has no other complaints  Objective: Vital signs in last 24 hours: Temp:  [97.8 F (36.6 C)-99.6 F (37.6 C)] 98.7 F (37.1 C) (11/03 0533) Pulse Rate:  [76-82] 81 (11/03 0533) Cardiac Rhythm: Normal sinus rhythm;Heart block (11/03 0700) Resp:  [17-23] 22 (11/03 0533) BP: (108-126)/(55-72) 120/72 (11/03 0533) SpO2:  [95 %-98 %] 95 % (11/03 0533) Weight:  [93.3 kg] 93.3 kg (11/03 0533)  Pre op weight 89.8 kg Current Weight  06/03/18 93.3 kg      Intake/Output from previous day: 11/02 0701 - 11/03 0700 In: 780 [P.O.:780] Out: 2200 [Urine:2200]   Physical Exam:  Cardiovascular: RRR Pulmonary: Slightly diminished at bases Abdomen: Soft, non tender, bowel sounds present. Extremities: Mild bilateral lower extremity edema. Wounds: Sternal ound is clean and dry.  No erythema or signs of infection.  Lab Results: CBC: Recent Labs    06/01/18 0317 06/02/18 0724  WBC 9.5 7.7  HGB 9.0* 8.4*  HCT 28.1* 26.1*  PLT 97* 94*   BMET:  Recent Labs    06/01/18 0317 06/02/18 0724  NA 138 136  K 4.6 4.0  CL 107 110  CO2 21* 21*  GLUCOSE 146* 109*  BUN 18 19  CREATININE 1.12 0.90  CALCIUM 8.2* 8.0*    PT/INR:  Lab Results  Component Value Date   INR 1.16 06/03/2018   INR 1.29 06/02/2018   INR 1.44 05/30/2018   ABG:  INR: Will add last result for INR, ABG once components are confirmed Will add last 4 CBG results once components are confirmed  Assessment/Plan:  1. CV - Has had a fib with CVR intermittently. SR, first degree heart block with HR in the 80's this am.  On Amiodarone  200 mg daily, Lisinopril 2.5 mg daily, Spironolactone 12.5 mg daily and Coumadin. INR this am decreased from 1.29 to 1.16. Will increase Coumadin to 4 mg daily. 2.  Pulmonary - On room air. Encourage incentive spirometer. 3. Acute on chronic CHF - On Torsemide 20 mg bid  4.  Acute blood loss anemia - H and H 8.4 and 26.1. Continue ferrous sulfate 5. Thrombocytopenia-platelets this am 94,000 6. DM-CBGs 144/99/109. On Insulin. Pre op HGA1C 5.8. 7. Physical deconditioning-PT 8.Will remove EPW  9. Stop stool softeners 10. Once Coumadin dose determined, will be able to be discharged.  Donielle M ZimmermanPA-C 06/03/2018,8:59 AM [phone]   Chart reviewed, patient examined, agree with above. INR has not bumped. Coumadin dose doubled to 4 mg daily.

## 2018-06-03 NOTE — Progress Notes (Signed)
Notified from [facility] that patient went into Afib at 0042. Heart rate 60's-70's. Patient asymptomatic. Will continue to monitor

## 2018-06-04 LAB — PROTIME-INR
INR: 1.23
Prothrombin Time: 15.4 seconds — ABNORMAL HIGH (ref 11.4–15.2)

## 2018-06-04 LAB — GLUCOSE, CAPILLARY
Glucose-Capillary: 110 mg/dL — ABNORMAL HIGH (ref 70–99)
Glucose-Capillary: 97 mg/dL (ref 70–99)
Glucose-Capillary: 109 mg/dL — ABNORMAL HIGH (ref 70–99)
Glucose-Capillary: 141 mg/dL — ABNORMAL HIGH (ref 70–99)

## 2018-06-04 MED ORDER — METOPROLOL TARTRATE 12.5 MG HALF TABLET
12.5000 mg | ORAL_TABLET | Freq: Two times a day (BID) | ORAL | Status: DC
  Administered 2018-06-04: 12.5 mg via ORAL
  Filled 2018-06-04: qty 1

## 2018-06-04 MED ORDER — AMIODARONE HCL 200 MG PO TABS: 200.0000 mg | ORAL_TABLET | Freq: Every day | ORAL | 1 refills | Status: DC

## 2018-06-04 MED ORDER — LISINOPRIL 2.5 MG PO TABS: 2.5000 mg | ORAL_TABLET | Freq: Every day | ORAL | 1 refills | Status: DC

## 2018-06-04 MED ORDER — POTASSIUM CHLORIDE CRYS ER 20 MEQ PO TBCR: 20.0000 meq | EXTENDED_RELEASE_TABLET | Freq: Every day | ORAL | Status: DC

## 2018-06-04 MED ORDER — WARFARIN SODIUM 2 MG PO TABS: 4.0000 mg | ORAL_TABLET | Freq: Every day | ORAL | 1 refills | Status: DC

## 2018-06-04 MED ORDER — POTASSIUM CHLORIDE CRYS ER 20 MEQ PO TBCR: 20.0000 meq | EXTENDED_RELEASE_TABLET | Freq: Every day | ORAL | 1 refills | Status: DC

## 2018-06-04 MED ORDER — TORSEMIDE 20 MG PO TABS
20.0000 mg | ORAL_TABLET | Freq: Every day | ORAL | Status: DC
  Administered 2018-06-04: 20 mg via ORAL

## 2018-06-04 MED ORDER — OXYCODONE HCL 5 MG PO TABS: 5.0000 mg | ORAL_TABLET | Freq: Four times a day (QID) | ORAL | 0 refills | Status: AC | PRN

## 2018-06-04 MED ORDER — TORSEMIDE 20 MG PO TABS: 20.0000 mg | ORAL_TABLET | Freq: Every day | ORAL | 1 refills | Status: DC

## 2018-06-04 MED FILL — WARFARIN SODIUM 2 MG TABLET: 2 | 30 days supply | Qty: 60 | Fill #0 | Status: TO

## 2018-06-04 MED FILL — POTASSIUM CL ER 20 MEQ TABL: 20 | 30 days supply | Qty: 30 | Fill #0 | Status: TO

## 2018-06-04 MED FILL — LISINOPRIL 2.5 MG TABLET: 2.5 | 30 days supply | Qty: 30 | Fill #0 | Status: TO

## 2018-06-04 MED FILL — oxyCODONE HCL 5 MG TABS: 5 | 7 days supply | Qty: 25 | Fill #0

## 2018-06-04 NOTE — Progress Notes (Signed)
Patient in a stable condition, discharge education reviewed with patient and his wife at bedside, they verbalized understanding, iv removed, tele dc ccmd notified, patient belongings at bedside, patient to be transported home by his wife.  

## 2018-06-04 NOTE — Progress Notes (Signed)
Sutures removed as ordered.

## 2018-06-04 NOTE — Progress Notes (Signed)
CARDIAC REHAB PHASE I   Offered to walk with pt, pt declining at this time as he is eating breakfast. Discharge education completed with pt and wife. Pt instructed to shower daily and monitor incisions. Encouraged continued IS use. Reinforced sternal precautions. Pt given in-the-tube, [facility] care guide, and heart healthy diet. Reviewed restrictions and exercise guidelines. Will refer to CRP II [place]. Pt for d/c today.   [phone] Rufina Falco, RN BSN 06/04/2018 9:26 AM

## 2018-06-04 NOTE — Progress Notes (Signed)
Physical Therapy Treatment Patient Details Name: Cody Phelps MRN: [id number] DOB: 07-03-1941 Today's Date: 06/04/2018    History of Present Illness Pt s/p AVR, CABG x 1, and MAZE procedure on 10/30. PMH - aortic stenosis, CAD, htn, dm, afib, OA, prostate CA    PT Comments    Patient seen for activity progression. At this time, continues to require assist and cues for transfers and reliance on RW for mobility. Feel patient will need HHPT upon discharge.   Follow Up Recommendations  Home health PT;Supervision/Assistance - 24 hour     Equipment Recommendations  None recommended by PT    Recommendations for Other Services       Precautions / Restrictions Precautions Precautions: Sternal Restrictions Weight Bearing Restrictions: Yes    Mobility  Bed Mobility               General bed mobility comments: received in chair  Transfers Overall transfer level: Needs assistance Equipment used: Rolling walker (2 wheeled) Transfers: Sit to/from Stand Sit to Stand: Min assist         General transfer comment: Assist to bring hips up. Verbal cues for hand placement to knees  Ambulation/Gait Ambulation/Gait assistance: Min guard Gait Distance (Feet): 180 Feet Assistive device: 1 person hand held assist Gait Pattern/deviations: Step-through pattern;Decreased stride length;Trunk flexed Gait velocity: decr Gait velocity interpretation: 1.31 - 2.62 ft/sec, indicative of limited community ambulator General Gait Details: reliance on UE support VCs for cadence and stability   Stairs             Wheelchair Mobility    Modified Rankin (Stroke Patients Only)       Balance Overall balance assessment: Needs assistance Sitting-balance support: No upper extremity supported;Feet supported Sitting balance-Leahy Scale: Fair     Standing balance support: Bilateral upper extremity supported Standing balance-Leahy Scale: Poor Standing balance comment: relaince on UE  support                            Cognition Arousal/Alertness: Awake/alert Behavior During Therapy: WFL for tasks assessed/performed Overall Cognitive Status: Within Functional Limits for tasks assessed                                        Exercises      General Comments        Pertinent Vitals/Pain Pain Assessment: Faces Faces Pain Scale: Hurts a little bit Pain Location: incisional Pain Descriptors / Indicators: Operative site guarding Pain Intervention(s): Monitored during session    Home Living                      Prior Function            PT Goals (current goals can now be found in the care plan section) Acute Rehab PT Goals Patient Stated Goal: return home PT Goal Formulation: With patient Time For Goal Achievement: 06/15/18 Potential to Achieve Goals: Good Progress towards PT goals: Progressing toward goals    Frequency    Min 3X/week      PT Plan Current plan remains appropriate    Co-evaluation              AM-PAC PT "6 Clicks" Daily Activity  Outcome Measure  Difficulty turning over in bed (including adjusting bedclothes, sheets and blankets)?: Unable Difficulty moving from lying on back  to sitting on the side of the bed? : Unable Difficulty sitting down on and standing up from a chair with arms (e.g., wheelchair, bedside commode, etc,.)?: Unable Help needed moving to and from a bed to chair (including a wheelchair)?: A Little Help needed walking in hospital room?: A Little Help needed climbing 3-5 steps with a railing? : A Lot 6 Click Score: 11    End of Session Equipment Utilized During Treatment: Gait belt Activity Tolerance: Patient tolerated treatment well Patient left: in chair;with call bell/phone within reach;with family/visitor present Nurse Communication: Mobility status PT Visit Diagnosis: Other abnormalities of gait and mobility (R26.89);Muscle weakness (generalized) (M62.81)      Time: 3888-2800 PT Time Calculation (min) (ACUTE ONLY): 14 min  Charges:  $Gait Training: 8-22 mins                     Alben Deeds, PT DPT  Board Certified Neurologic Specialist [place] Pager [phone] Office [phone]    Cody Phelps 06/04/2018, 12:02 PM

## 2018-06-04 NOTE — Care Management Note (Signed)
Case Management Note Marvetta Gibbons RN, BSN Transitions of Care Unit 4E- RN Case Manager [phone]  Patient Details  Name: AARIAN GRIFFIE MRN: [id number] Date of Birth: Apr 23, 1941  Subjective/Objective:   Pt admitted s/p AVR/CABG/MAZE                 Action/Plan: PTA pt lived at home with wife, orders placed for HHPT, spoke with wife at bedside who states pt has all needed DME at home and has used [facility] in the past- would like to use them again- address, phone # and PCP confirmed in epic. Referral called to [place] with [facility]- referral accepted.   Expected Discharge Date:  06/04/18               Expected Discharge Plan:  [place]  In-House Referral:     Discharge planning Services  CM Consult  Post Acute Care Choice:  Home Health Choice offered to:  Spouse  DME Arranged:    DME Agency:     HH Arranged:  PT [place]:  [facility] (formerly [employer])  Status of Service:  Completed, signed off  If discussed at [employer] of Stay Meetings, dates discussed:    Discharge Disposition: home/home health   Additional Comments:  Dawayne Patricia, RN 06/04/2018, 1:24 PM

## 2018-06-04 NOTE — Progress Notes (Addendum)
[address]       [place] [address]             [phone]      5 Days Post-Op Procedure(s) (LRB): AORTIC VALVE REPLACEMENT (AVR) using Inspiris Valve, Size 23 (N/A) CORONARY ARTERY BYPASS GRAFTING (CABG) x one using left internal mammary artery (N/A) MAZE (N/A) TRANSESOPHAGEAL ECHOCARDIOGRAM (TEE) (N/A) Subjective: Feels well overall, som increase in sputum production but still minor  Objective: Vital signs in last 24 hours: Temp:  [98 F (36.7 C)-99.6 F (37.6 C)] 98.5 F (36.9 C) (11/04 0744) Pulse Rate:  [80-90] 81 (11/04 0744) Cardiac Rhythm: Normal sinus rhythm (11/03 2335) Resp:  [19-24] 19 (11/04 0509) BP: (115-153)/(53-85) 125/58 (11/04 0744) SpO2:  [92 %-98 %] 95 % (11/04 0744) Weight:  [91.9 kg] 91.9 kg (11/04 0509)  Hemodynamic parameters for last 24 hours:    Intake/Output from previous day: 11/03 0701 - 11/04 0700 In: 568 [P.O.:565; I.V.:3] Out: 2700 [Urine:2700] Intake/Output this shift: No intake/output data recorded.  General appearance: alert, cooperative and no distress Heart: regular rate and rhythm Lungs: mildly dim in bases Abdomen: benign Extremities: no edema Wound: incis healing well  Lab Results: Recent Labs    06/02/18 0724  WBC 7.7  HGB 8.4*  HCT 26.1*  PLT 94*   BMET:  Recent Labs    06/02/18 0724  NA 136  K 4.0  CL 110  CO2 21*  GLUCOSE 109*  BUN 19  CREATININE 0.90  CALCIUM 8.0*    PT/INR:  Recent Labs    06/04/18 0321  LABPROT 15.4*  INR 1.23   ABG    Component Value Date/Time   PHART 7.325 (L) 05/30/2018 1921   HCO3 22.1 05/30/2018 1921   TCO2 23 05/30/2018 2101   ACIDBASEDEF 4.0 (H) 05/30/2018 1921   O2SAT 75.4 06/01/2018 0315   CBG (last 3)  Recent Labs    06/02/18 2051 06/03/18 1135 06/03/18 1628  GLUCAP 109* 120* 130*    Meds Scheduled Meds: . acetaminophen  1,000 mg Oral Q6H  . amiodarone  200 mg Oral Daily  . aspirin EC  81 mg Oral Daily  . atorvastatin   80 mg Oral QPM  . Chlorhexidine Gluconate Cloth  6 each Topical Daily  . colesevelam  1,875 mg Oral Daily  . donepezil  10 mg Oral q morning - 10a  . ferrous sulfate  325 mg Oral TID WC  . insulin aspart  0-15 Units Subcutaneous TID WC  . lisinopril  2.5 mg Oral Daily  . mouth rinse  15 mL Mouth Rinse BID  . memantine  5 mg Oral BID  . mirabegron ER  50 mg Oral Daily  . mupirocin ointment  1 application Nasal BID  . pantoprazole  40 mg Oral Daily  . potassium chloride  20 mEq Oral BID WC  . rOPINIRole  2 mg Oral QHS  . sodium chloride flush  3 mL Intravenous Q12H  . sodium chloride flush  3 mL Intravenous Q12H  . spironolactone  12.5 mg Oral Daily  . tamsulosin  0.4 mg Oral QPC supper  . torsemide  20 mg Oral BID  . warfarin  4 mg Oral q1800  . Warfarin - Physician Dosing Inpatient   Does not apply q1800   Continuous Infusions: . sodium chloride    . lactated ringers     PRN Meds:.sodium chloride, metoprolol tartrate, morphine injection, ondansetron (ZOFRAN) IV, oxyCODONE, rOPINIRole, sodium chloride flush,  sodium chloride flush, traMADol  Xrays No results found.  Assessment/Plan: S/P Procedure(s) (LRB): AORTIC VALVE REPLACEMENT (AVR) using Inspiris Valve, Size 23 (N/A) CORONARY ARTERY BYPASS GRAFTING (CABG) x one using left internal mammary artery (N/A) MAZE (N/A) TRANSESOPHAGEAL ECHOCARDIOGRAM (TEE) (N/A)  1 overall doing well 2 hemodyn stable in sinus rhythm , afebrile 3 sats good on RA, cont routine pulm toilet, conts CPAP at night 4 slight bump in INR, 1.23, coumadin increased to 4 mg yesterday- continue 5 BS ok 6 cont routine rehab 7 no other new labs or CXR 8 good diuresis- approaching preop wt- poss d/c diuretic soon 9 home soon   LOS: 5 days    Moxon Giovanni PA-C 06/04/2018 Pager [phone]  I have seen and examined the patient and agree with the assessment and plan as outlined.  Doing very well and maintaining NSR.  Will restart low dose metoprolol.   Weight approaching preop baseline although patient was dependent on diuretics preop including twice/daily torsemide and daily spironolactone.  Will continue both but D/C home on reduced dose torsemide.  Instructed patient to continue to monitor weight on a daily basis and look for clinical signs of fluid overload.  Continue Coumadin 4 mg/day - check INR at Dr Irven Shelling office later this week.  Plan Coumadin x 3 months then consider switching back to Eliquis if desired.  Hasn't been seen by PT over the weekend and their recommendations remain unclear.  Mr Mimbs might benefit from home PT until he is ready to start the outpatient cardiac rehab program.  [name], MD 06/04/2018 8:53 AM

## 2018-06-04 NOTE — Plan of Care (Signed)
  Problem: Education: Goal: Knowledge of General Education information will improve Description Including pain rating scale, medication(s)/side effects and non-pharmacologic comfort measures Outcome: Progressing   Problem: Health Behavior/Discharge Planning: Goal: Ability to manage health-related needs will improve Outcome: Progressing   Problem: Clinical Measurements: Goal: Will remain free from infection Outcome: Progressing Goal: Respiratory complications will improve Outcome: Progressing Goal: Cardiovascular complication will be avoided Outcome: Progressing   Problem: Nutrition: Goal: Adequate nutrition will be maintained Outcome: Progressing   Problem: Elimination: Goal: Will not experience complications related to bowel motility Outcome: Progressing   Problem: Pain Managment: Goal: General experience of comfort will improve Outcome: Progressing

## 2018-06-05 ENCOUNTER — Telehealth (HOSPITAL_COMMUNITY): Payer: Self-pay

## 2018-06-05 DIAGNOSIS — Z48812 Encounter for surgical aftercare following surgery on the circulatory system: Secondary | ICD-10-CM

## 2018-06-05 NOTE — Telephone Encounter (Signed)
Pt's insurance is active and benefits verified through [facility].  No Co-pay, deductible amount of $200.00/$200.00, out of pocket amount $2,200/$200.00, no co-insurance, and no pre-authorization is required. Passport/reference # - [phone]  Will make initial call to pt in regards to Cardiac Rehab. If interested, pt will need to complete follow up appt. Once completed, pt will be contacted for scheduling.

## 2018-06-05 NOTE — Telephone Encounter (Signed)
Attempted to make initial call to pt in regards to CR - lm on vm °

## 2018-06-19 ENCOUNTER — Telehealth (HOSPITAL_COMMUNITY): Payer: Self-pay

## 2018-06-19 NOTE — Telephone Encounter (Signed)
Wife of patient returned phone call in regards to CR - pt is interested in the program. Explained scheduling process and went over insurance with wife, she verbalized understanding. Will contact pt for scheduling once f/u appt has been completed.

## 2018-06-20 ENCOUNTER — Other Ambulatory Visit: Payer: Self-pay | Admitting: Oncology

## 2018-06-20 DIAGNOSIS — C61 Malignant neoplasm of prostate: Secondary | ICD-10-CM

## 2018-06-20 DIAGNOSIS — D649 Anemia, unspecified: Secondary | ICD-10-CM

## 2018-06-20 DIAGNOSIS — Z953 Presence of xenogenic heart valve: Secondary | ICD-10-CM

## 2018-06-20 DIAGNOSIS — I2510 Atherosclerotic heart disease of native coronary artery without angina pectoris: Secondary | ICD-10-CM

## 2018-06-22 ENCOUNTER — Other Ambulatory Visit: Payer: Self-pay | Admitting: Thoracic Surgery (Cardiothoracic Vascular Surgery)

## 2018-06-22 DIAGNOSIS — Z951 Presence of aortocoronary bypass graft: Secondary | ICD-10-CM

## 2018-06-25 ENCOUNTER — Encounter: Payer: Self-pay | Admitting: Thoracic Surgery (Cardiothoracic Vascular Surgery)

## 2018-06-26 ENCOUNTER — Ambulatory Visit
Admission: RE | Admit: 2018-06-26 | Discharge: 2018-06-26 | Disposition: A | Discharge status: Home / Self Care | Payer: Medicare Other | Source: Ambulatory Visit | Attending: Thoracic Surgery (Cardiothoracic Vascular Surgery) | Admitting: Thoracic Surgery (Cardiothoracic Vascular Surgery)

## 2018-06-26 ENCOUNTER — Ambulatory Visit (INDEPENDENT_AMBULATORY_CARE_PROVIDER_SITE_OTHER): Payer: Self-pay | Admitting: Surgical

## 2018-06-26 VITALS — BP 130/56 | HR 69 | Resp 20 | Ht 68.0 in | Wt 194.0 lb

## 2018-06-26 DIAGNOSIS — Z8679 Personal history of other diseases of the circulatory system: Secondary | ICD-10-CM

## 2018-06-26 DIAGNOSIS — I350 Nonrheumatic aortic (valve) stenosis: Secondary | ICD-10-CM

## 2018-06-26 DIAGNOSIS — Z952 Presence of prosthetic heart valve: Secondary | ICD-10-CM

## 2018-06-26 DIAGNOSIS — Z951 Presence of aortocoronary bypass graft: Secondary | ICD-10-CM

## 2018-06-26 DIAGNOSIS — Z9889 Other specified postprocedural states: Secondary | ICD-10-CM

## 2018-06-26 DIAGNOSIS — I2510 Atherosclerotic heart disease of native coronary artery without angina pectoris: Secondary | ICD-10-CM

## 2018-06-26 NOTE — Patient Instructions (Signed)
Given verbal instructions on activity progression and lifting restrictions

## 2018-06-26 NOTE — Progress Notes (Signed)
[address]       [place] [address]             [phone]      Jariel G [place] Record #[id number] Date of Birth: 12-28-1940  Referring: Adrian Prows, MD Primary Care: [name], MD Primary Cardiologist: No primary care provider on file.   Chief Complaint:   POST OP FOLLOW UP Date of Procedure:                05/30/2018  Preoperative Diagnosis:       ? Severe Aortic Stenosis ? Severe Coronary Artery Disease ? Paroxysmal Atrial Fibrillation  Postoperative Diagnosis:    Same  Procedure:        Aortic Valve Replacement             Edwards Inspiris Resilia Stented Bovine Pericardial Tissue Valve (size 48mm, ref # [id number], serial # [id number])   Coronary Artery Bypass Grafting x 1             Left Internal Mammary Artery to First Diagonal Branch Coronary Artery   Maze Procedure              complete bilateral atrial lesion set using bipolar radiofrequency and cryothermy ablation             clipping of left atrial appendage (Atriclip size 6mm)   Surgeon:        Valentina Gu. Roxy Manns, MD  Assistant:       Yann Giovanni, PA-C  Anesthesia:    Duane Boston, MD  Operative Findings: ? severe aortic stenosis ? Mild left ventricular systolic dysfunction ? Good quality left internal mammary artery conduit ? Good quality target vessel for grafting ? Diffuse adhesions and chronic inflammation throughout the pericardial space ? Severe calcification in aortic annulus extending into the LV outflow tract and mitral valve  History of Present Illness:    Patient is a 77 year old male status post the above described procedure seen in the office on today's date and routine postsurgical follow-up.  Currently his only significant complaint is shortness of breath.  Of note, he was seen in the office yesterday by Dr. [name] and was found to be in atrial fibrillation.  He has been switched from Coumadin to Xarelto.  There are plans to  cardiovert him after December 11 as well as obtain an echocardiogram.  Overall, however he feels pretty well.  He does fatigue fairly easily but does state that this is showing some slow improvement over time.  He has not had any fevers or chills.  He has not had any noticeable palpitations.  He has not had any significant edema .  He has not had any PND.  Overall he and his wife seem generally pleased with the progress of his recovery.      Past Medical History:  Diagnosis Date  . Carotid stenosis, left followed by dr [name], cardiologist--- bilateral bruit per note   per dr [name] note 32/4401  LICA 02-72% stenosis per duplex 05-14-2014  . Diverticulosis   . ED (erectile dysfunction)   . First degree heart block   . GERD (gastroesophageal reflux disease)   . Heart murmur   . Hypertension    cardiologsit-  dr [name]  . Iron deficiency anemia   . Mixed hyperlipidemia   . Morbid obesity ([place])   . Myocardial infarction ([place])    02/28/18  . OA (osteoarthritis)    right  hip  . OSA on CPAP    followed by dr dohmeier, uses CPAP  . PAF (paroxysmal atrial fibrillation) ([name])    a. on Eliquis  . Prostate cancer ([place])    dx 03-17-2017 (bx)  Stage T2a, Gleason 4+4, PSA  4.43, vol 32.32--  s/p radiatctive prostate seed implants 07-10-2017 then IMRT and ADT  . RLS (restless legs syndrome)   . S/P aortic valve replacement with bioprosthetic valve 05/30/2018   23 mm Edwards Inspiris Resilia stented bovine pericardial tissue valve  . 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 S/P Maze operation for atrial fibrillation 05/30/2018   Complete bilateral atrial lesion set using bipolar radiofrequency and cryothermy ablation with clipping of LA appendage  . Scoliosis   . Severe aortic stenosis   . Trigger finger   . Type 2 diabetes mellitus ([place])   . Wears partial dentures    upper and lower     Social History   Tobacco Use  Smoking Status Former Smoker  . Years: 1.00  . Types:  Cigarettes  . Last attempt to quit: 02/04/1967  . Years since quitting: 51.4  Smokeless Tobacco Never Used  Tobacco Comment   smoked 1 q2-3 days    Social History   Substance and Sexual Activity  Alcohol Use Yes   Comment: occas.     No Known Allergies  Current Outpatient Medications  Medication Sig Dispense Refill  . acetaminophen (TYLENOL) 325 MG tablet Take 1 tablet (325 mg total) by mouth every 4 (four) hours as needed (headache, pain). 60 tablet 3  . amiodarone (PACERONE) 200 MG tablet Take 1 tablet (200 mg total) by mouth daily. 30 tablet 1  . atorvastatin (LIPITOR) 80 MG tablet Take 1 tablet (80 mg total) by mouth daily at 6 PM. (Patient taking differently: Take 80 mg by mouth every evening. ) 60 tablet 3  . colesevelam (WELCHOL) 625 MG tablet Take 1,875 mg by mouth daily.     Marland Kitchen donepezil (ARICEPT) 10 MG tablet Take 10 mg by mouth every morning.     Marland Kitchen esomeprazole (NEXIUM) 40 MG capsule Take 40 mg by mouth daily.     . ferrous sulfate 325 (65 FE) MG tablet Take 1 tablet (325 mg total) by mouth 3 (three) times daily with meals. (Patient taking differently: Take 325 mg by mouth daily with breakfast. ) 60 tablet 3  . insulin glargine (LANTUS SOLOSTAR) 100 UNIT/ML injection Inject 20-44 Units into the skin at bedtime as needed (for BGL >150).     Marland Kitchen lisinopril (PRINIVIL,ZESTRIL) 2.5 MG tablet Take 1 tablet (2.5 mg total) by mouth daily. 30 tablet 1  . memantine (NAMENDA) 5 MG tablet Take 5 mg by mouth 2 (two) times daily.    . metoprolol tartrate (LOPRESSOR) 25 MG tablet Take 0.5 tablets (12.5 mg total) by mouth 2 (two) times daily. 60 tablet 3  . mirabegron ER (MYRBETRIQ) 50 MG TB24 tablet Take 50 mg by mouth daily.    Marland Kitchen omega-3 acid ethyl esters (LOVAZA) 1 G capsule Take 2 g by mouth daily.     . potassium chloride SA (K-DUR,KLOR-CON) 20 MEQ tablet Take 1 tablet (20 mEq total) by mouth daily with breakfast. 30 tablet 1  . Rivaroxaban (XARELTO) 15 MG TABS tablet Take 15 mg by mouth  daily with supper.    Marland Kitchen rOPINIRole (REQUIP) 2 MG tablet Take 2 mg by mouth 2 (two) times daily.     Marland Kitchen spironolactone (ALDACTONE)  25 MG tablet Take 0.5 tablets (12.5 mg total) by mouth daily. 30 tablet 3  . tamsulosin (FLOMAX) 0.4 MG CAPS capsule Take 0.4 mg by mouth daily after supper.     . torsemide (DEMADEX) 20 MG tablet Take 1 tablet (20 mg total) by mouth daily. 30 tablet 1   No current facility-administered medications for this visit.        Physical Exam: BP (!) 130/56   Pulse 69   Resp 20   Ht 5\' 8"  (1.727 m)   Wt 64.4 kg   SpO2 98% Comment: RA  BMI 21.59 kg/m   General appearance: alert, cooperative and no distress Heart: regular rate and rhythm and No murmur Lungs: clear to auscultation bilaterally Abdomen: Benign exam Extremities: No edema Wound: Incision well-healed without evidence of infection.   Diagnostic Studies & Laboratory data:     Recent Radiology Findings:   Dg Chest 2 View  Result Date: 06/26/2018 CLINICAL DATA:  Patient status post CABG and aortic valve replacement 05/30/2018. EXAM: CHEST - 2 VIEW COMPARISON:  PA and lateral chest 06/02/2018. FINDINGS: Seven intact median sternotomy wires are unchanged. Atrial appendage clip and prosthetic aortic valve are noted. Small pleural effusions seen on the prior examination have resolved. The lungs are clear. No pneumothorax or pleural fluid. No acute or focal bony abnormality. IMPRESSION: No acute disease. Small pleural effusions seen on the prior exam have resolved. Electronically Signed   By: Inge Rise M.D.   On: 06/26/2018 13:16      Recent Lab Findings: Lab Results  Component Value Date   WBC 7.7 06/02/2018   HGB 8.4 (L) 06/02/2018   HCT 26.1 (L) 06/02/2018   PLT 94 (L) 06/02/2018   GLUCOSE 109 (H) 06/02/2018   CHOL 151 03/01/2018   TRIG 69 03/01/2018   HDL 55 03/01/2018   LDLCALC 82 03/01/2018   ALT 23 05/28/2018   AST 18 05/28/2018   NA 136 06/02/2018   K 4.0 06/02/2018   CL 110  06/02/2018   CREATININE 0.90 06/02/2018   BUN 19 06/02/2018   CO2 21 (L) 06/02/2018   INR 1.23 06/04/2018   HGBA1C 5.8 (H) 05/28/2018      Assessment / Plan: Patient continues to recover well from surgery.  He will continue his follow-up with Dr. [name] with plans as stated if he continues to be in atrial fibrillation.  He is being anticoagulated with Xarelto at this time.  He does not appear to be volume overloaded on examination at this time.  Overall his postsurgical progress is felt to be good and we will see him again in 2 months for repeat check.  I have not made any changes to his current medical regimen.          Tanmay Giovanni, PA-C 06/26/2018 1:47 PM

## 2018-07-01 IMAGING — CR DG CHEST 2V
2 series · 2 of 2 positions shown · non-contrast
Comparison: 06/08/2017

CLINICAL DATA: Preoperative evaluation for upcoming hip surgery

EXAM:
CHEST - 2 VIEW

[w chest pa]
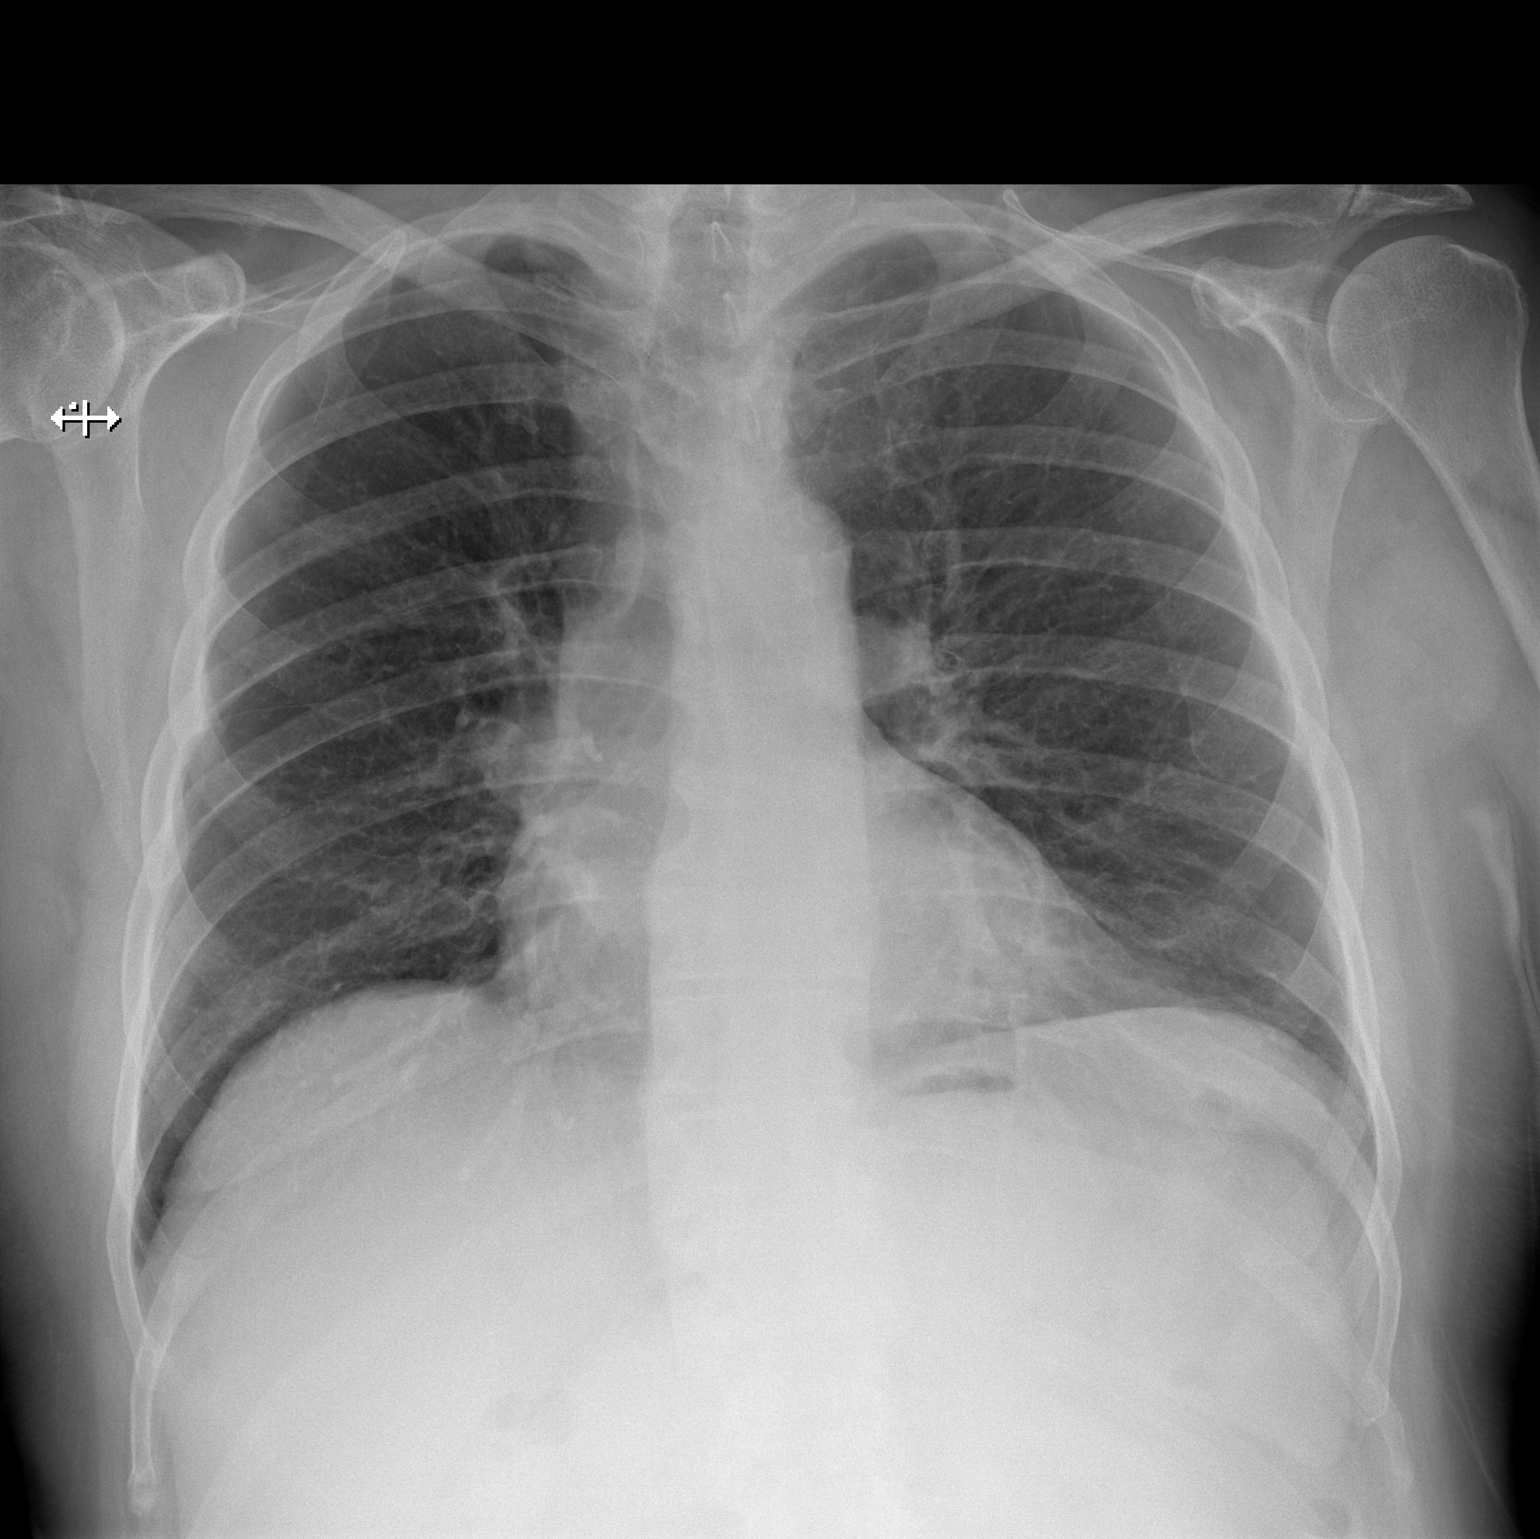

[w chest lat]
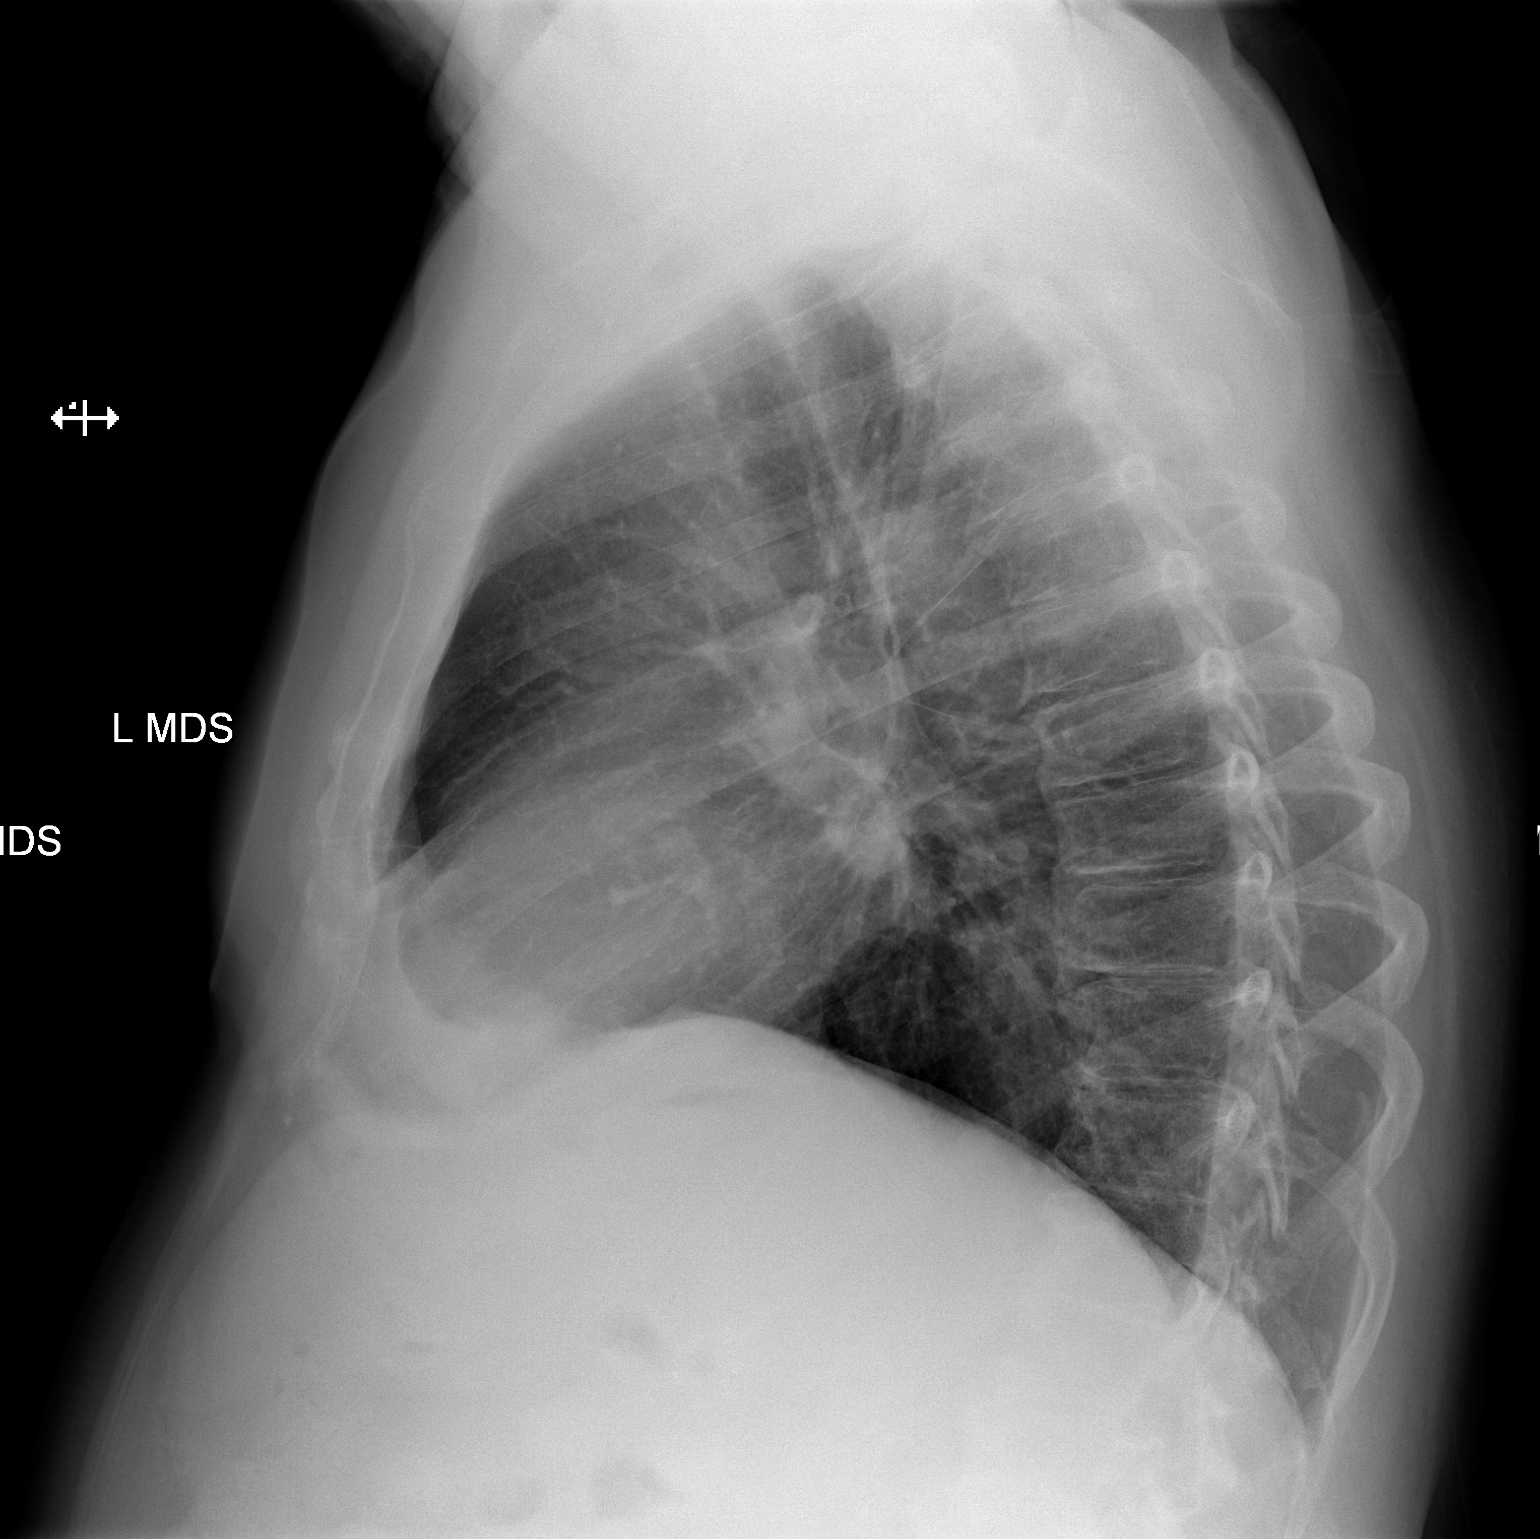

[2 of 2 positions shown; findings below may reference images not displayed]

FINDINGS: Cardiac shadow is stable. Minimal scarring is again noted in the
left base. No focal infiltrate or sizable effusion is seen. No acute
bony abnormality is noted.
IMPRESSION: No active cardiopulmonary disease.

## 2018-07-09 ENCOUNTER — Telehealth (HOSPITAL_COMMUNITY): Payer: Self-pay

## 2018-07-09 NOTE — Telephone Encounter (Signed)
Called and spoke to patient wife Butch Penny who states pt is interested in participating in the Cardiac Rehab Program. Patient will come in for orientation on 08/21/18 @ 8:15AM and will attend the 1:15PM exercise class.  Mailed homework package.

## 2018-08-09 NOTE — H&P (Signed)
OFFICE VISIT NOTES COPIED TO EPIC FOR DOCUMENTATION  . History of Present Illness Cody Page MD; 08/02/2018 5:30 PM) Patient words: fu for Echo, labs; last OV 06/25/18.  The patient is a 78 year old male who presents for a Follow-up for Aortic stenosis.  Additional reasons for visit:  Follow-up for Coronary artery disease is described as the following: Cody Phelps is a Caucasian male with invasive prostate cancer diagnosed in Nov 2018 and is S/P chemo and RT and in remission, anemia of chronic disease and also mild iron defeciency, hypertension, hyperlipidemia, obstructive sleep apnea on CPAP, controlled diabetes mellitus, mild obesity, mostly truncal obesity, Late presentation of antero-lat MI on 03/01/2017. He has had multiple hospital admissions since then with acute decompensated heart failure.  He underwent aortic valve replacement along with LIMA to D1 on 05/30/2018 by Dr. Darylene Price with implantation of a bovine pericardial tissue valve along with cryoablation and clipping of left atrial appendage. He is now here for follow-up of chronic diastolic heart failure and management of atrial fibrillation and coronary artery disease.  He has lost about 35 pounds in weight since he got ill and was seen frequently prior to his CABG and AVR. But unfortunately has again slowly started to gain weight. He now has noticed marked dyspnea on doing minimal activities and also recurrence of leg edema.Accompanied by his wife.   Problem List/Past Medical Cody Phelps; 08/02/2018 10:40 AM) Sleep study 02/08/05 and 10/29/08 (positive for sleep apnea-Sleep with CPAP every night.  Sleep Study 12/06/2013: Severe sleep apnea, patient started on CPAP. 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 Minimal mixed plaque noted. Antegrade right vertebral artery flow. Antegrade left vertebral artery flow. Controlled type 2 diabetes mellitus with insulin therapy (E11.9)  Hyperlipemia, mixed (E78.2)  Laboratory examination (Z01.89)  06/29/2018: RBC 3.29, Hgb 9.3, Hct 28.5, CBC otherwise normal. Glucose 124, creatinine 1.08,eGFR 67/77, potassium 4.7, CMP normal. Cholesterol 115, triglycerides 114, HDL 48, LDL 44. Labs 04/13/2018: HB 8.6/HCT 26.9, normal indicis. Platelets 338. Serum glucose 154 mg, BUN 30, creatinine 1.29, EGFR 54/62 mL, potassium 4.2. CMP otherwise normal. Labs 0/19/2019: HB 8.4/HCT 26.7, platelets 348, normal indicis. Serum glucose 137 mg, BUN 27, creatinine 1.19, EGFR 58 mL. A1c 6.4%. Total cholesterol 121, triglycerides 69, HDL 55, LDL 82. Coronary artery disease of native artery of native heart with stable angina pectoris (I25.118)  Paroxysmal atrial fibrillation (I48.0)  CHA2DS2-VASc Score is 5. (CHF; HTN; vasc disease DM, Male = 1; Age <65 =1; 65-74 = 2, >75 =3; stroke = 3). Chronic diastolic heart failure (Z61.09)  History of aortic valve replacement with bioprosthetic valve (Z95.3)  - CABG X 1 plus AV replacement and LIMA to D-1 on 05/30/2018 by Cody Cove, MD: Cody Phelps Resilia Stented Bovine Pericardial Tissue Valve (size 35m, ref # [id number], serial # [id number]. Maze Procedure complete bilateral atrial esion set using bipolar radiofrequency and cryothermy ablation clipping of left atrial appendage (Atriclip size 439m Echocardiogram 07/11/2018: Left ventricle cavity is normal in size. Abnormal septal wall motion due to post-operative coronary artery bypass graft. Diastolic function assessment limited due to mitral annular calcification and post op status. Calculated EF 65%. Left atrial cavity is mildly dilated. Well seated bioprosthetic aortic valve with normal functioning. Mean PG 8  mmHg likely normal for  bioprosthetic valve. Moderate calcification of the mitral valve annulus and leaflets. No significant stenosis. Moderate (Grade II) mitral regurgitation. Compared to previous study on 08/24/2017, bioprosthetic aortic valve is new. Long-term (current) use of anticoagulants (Coumadin) (V58.61) (Z79.01)   Allergies Cody Phelps; 08-17-18 10:40 AM) No Known Drug Allergies [11/07/2012]:  Family History Cody Phelps; 08/17/2018 10:40 AM) Mother  Deceased. at age 44 from stroke complications; several smaller strokes untreated, no heart attacks, no other cardiovascular conditions Father  Deceased. at age 9 from MI; CABG at age 30, no strokes, no other cardiovascular conditions Sister 1  In stable health. 5 yrs younger; HTN, DM, no other cardiovascular conditions Brother 1  3 yrs older; Hx of CABG, no other cardiovascular conditions  Social History Cody Phelps; 2018/08/17 10:40 AM) No smoking. Occasionally drinks alcohol. Married. 2 children.  Current tobacco use  Never smoker. Alcohol Use  Occasional alcohol use. Marital status  Married. Living Situation  Lives with spouse. Number of Children  2.  Past Surgical History Cody Phelps; August 17, 2018 10:40 AM) Appendectomy 1988.  Coronary Artery Bypass, Single  CABG X 1 plus AV replacement and LIMA to D-1 on 05/30/2018 by Cody Cove, MD: Cody Phelps Resilia Stented Bovine Pericardial Tissue Valve (size 43m, ref # [id number], serial # [id number]. Maze Procedure complete bilateral atrial esion set using bipolar radiofrequency and cryothermy ablation clipping of left atrial appendage (Atriclip size 475m Valve Replacement  CABG X 1 plus AV replacement and LIMA to D-1 on 05/30/2018 by CuLilly CoveMD: EdMargaretha Sheffieldesilia Stented Bovine Pericardial Tissue Valve (size 2379mref # [id number], serial # [id number] Procedure complete bilateral atrial esion set using bipolar radiofrequency and cryothermy ablation  clipping of left atrial appendage (Atriclip size 74m42mumbectomy [01/2014]:  Medication History (Cody Phelps; 08/03/99-17-20200 PM) Xarelto (15MG Tablet, 1 (one) Tablet Oral every evening after dinner, Taken starting 07/23/2018) Active. Potassium Chloride ER (20MEQ Tablet ER, 1 (one) Tablet Oral dailywith breakfast, Taken starting 07/23/2018) Active. (started in hospital) Amiodarone HCl (200MG Tablet, 1 Tablet Oral daily, Taken starting 07/11/2018) Active. Metoprolol Tartrate (25MG Tablet, 1 Tablet Tablet Oral daily, Taken starting 05/28/2018) Active. Spironolactone (25 Tablet, 1/2 Oral daily, Taken starting 05/11/2018) Active. Atorvastatin Calcium (80MG Tablet, 1 Oral daily, Taken starting 03/02/2018) Active. Aricept (10MG Tablet, 1 Oral daily) Active. NexIUM (40MG Capsule DR, 1 Oral daily as needed) Active. Lovaza (1GM Capsule, 2 Oral daily) Active. Welchol (625MG Tablet, 3 Oral daily) Active. Iron (Ferrous Sulfate) (325MG Tablet, 1 Oral three times daily) Active. Tamsulosin HCl (0.4MG Capsule, 1 Oral daily) Active. Nitroglycerin (0.4MG Tab Sublingual, 1 Sublingual every 5 minutes as needed for chest pain.) Active. Vitamin C (1000MG Tablet, 1 Oral every other day) Active. Memantine HCl (5MG Tablet, 1 Oral two times daily) Active. Myrbetriq (50MG Tablet ER 24HR, 1 Oral at bedtime) Active. Claritin (10MG Capsule, 1 Oral prn) Active. Symbicort (160-4.5MCG/ACT Aerosol, Inhalation prn) Active. Lisinopril (2.5MG Tablet, 1 Oral daily) Active. Medications Reconciled (list provided)  Diagnostic Studies History (LakGeorgeanna Harrison2/January 17, 202038 AM) Echocardiogram  07/11/2018: Left ventricle cavity is normal in size. Abnormal septal wall motion due to post-operative coronary artery bypass graft. Diastolic function assessment limited due to mitral annular calcification and post op status. Calculated EF 65%. Left atrial cavity is mildly dilated. Well seated bioprosthetic  aortic valve with normal functioning. Mean PG 8 mmHg likely normal for bioprosthetic valve. Moderate calcification of the mitral valve annulus and leaflets. No significant stenosis. Moderate (Grade II) mitral regurgitation. Compared to previous study on 08/24/2017,  bioprosthetic aortic valve is new. Carotid Doppler  03/29/2018: No hemodynamically significant arterial disease in the internal carotid artery bilaterally. Minimal mixed plaque noted. Antegrade right vertebral artery flow. Antegrade left vertebral artery flow.  Health Maintenance History Cody Phelps; 08/02/2018 10:40 AM) Colonoscopy-normal. Endoscopy- normal.     Review of Systems Cody Page MD; 08/02/2018 5:30 PM) General Not Present- Fatigue and Fever. HEENT Not Present- Blurred Vision and Headache. Respiratory Not Present- Hemoptysis, Wakes up from Sleep Wheezing or Short of Breath and Wheezing. Cardiovascular Present- Difficulty Breathing On Exertion and Edema. Not Present- Calf Cramps and Fainting. Gastrointestinal Not Present- Abdominal Pain, Black, Tarry Stool, Bloody Stool and Difficulty Swallowing. Musculoskeletal Present- Backache (Chronic, stable since back surgery) and Joint Pain (bilateral knee pain, left knee worse and now in brace due to pain). Not Present- Leg Weakness. Neurological Not Present- Dysarthria and Focal Neurological Symptoms. Psychiatric Not Present- Anxiety, Depression, Suicidal Ideation and Suicidal Planning. Endocrine Not Present- Polydipsia and Polyuria. Hematology Not Present- Abnormal Bleeding, Easy Bleeding and Easy Bruising. All other systems negative  Vitals Cody Phelps; 08/02/2018 10:54 AM) 08/02/2018 10:43 AM Weight: 202.06 lb Height: 69in Body Surface Area: 2.07 m Body Mass Index: 29.84 kg/m  Pulse: 58 (Regular)  P.OX: 98% (Room air) BP: 150/63 (Sitting, Right Arm, Standard)       Physical Exam Cody Page MD; 08/02/2018 5:30 PM) General Mental  Status-Alert. General Appearance-Cooperative, Appears stated age, Not in acute distress. Orientation-Oriented X3. Build & Nutrition-Well built and Mildly obese.  Head and Neck Thyroid Gland Characteristics - no palpable nodules, no palpable enlargement.  Chest and Lung Exam Chest and lung exam reveals -quiet, even and easy respiratory effort with no use of accessory muscles and on auscultation, normal breath sounds, no adventitious sounds.  Cardiovascular Cardiovascular examination reveals -normal heart sounds, regular rate and rhythm with no murmurs, femoral artery auscultation bilaterally reveals normal pulses, no bruits, no thrills, normal pedal pulses bilaterally and no digital clubbing, cyanosis, edema, increased warmth or tenderness.  Abdomen Inspection Contour - Obese. Palpation/Percussion Normal exam - Non Tender and No hepatosplenomegaly. Auscultation Normal exam - Bowel sounds normal.  Peripheral Vascular Lower Extremity Palpation - Edema - Bilateral - 2+ Pitting edema. Carotid arteries - Bilateral-Harsh Bruit.  Neurologic Motor-Grossly intact without any focal deficits.  Musculoskeletal - Did not examine.  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 Maze Procedure complete bilateral atrial esion set using bipolar radiofrequency and cryothermy ablation clipping of left atrial appendage (Atriclip size 438m  Echocardiogram 07/11/2018: Left ventricle cavity is normal in size. Abnormal septal wall motion due to post-operative coronary artery bypass graft. Diastolic function assessment limited due to mitral annular calcification and post op status. Calculated EF 65%. Left atrial  cavity is mildly dilated. Well seated bioprosthetic aortic valve with normal functioning. Mean PG 8 mmHg likely normal for bioprosthetic valve. Moderate calcification of the mitral valve annulus and leaflets. No significant stenosis. Moderate (Grade II) mitral regurgitation. Compared to previous study on 08/24/2017, bioprosthetic aortic valve is new. Coronary artery disease of native artery of native heart with stable angina pectoris (I25.118) Paroxysmal atrial fibrillation (I48.0) Story: CHA2DS2-VASc Score is 5. (CHF; HTN; vasc disease DM, Male = 1; Age <65 =1;  65-74 = 2, >  75 =3; stroke = 3). Impression: EKG 08/02/2018: Underlying atrial flutter with 3:1 conduction, ventricular rate 55 bpm, inferior infarct old, anterolateral infarct old. Nonspecific ST-T abnormality. Normal QT interval. No significant change from EKG 06/25/2018 Current Plans Changed Amiodarone HCl 200MG,  Tablet daily, #30, 30 days starting 08/02/2018, No Refill. Continued Xarelto 15MG, 1 (one) Tablet every evening after dinner, Mail Order #90, 90 days starting 08/02/2018, Ref. x3. METABOLIC PANEL, BASIC (18299) Complete electrocardiogram (37169) Chronic diastolic heart failure (C78.93) Current Plans Discontinued Lisinopril 2.5MG (Cough). Started Valsartan-hydroCHLOROthiazide 80-12.5MG, 1 (one) Tablet every morning, #30, 30 days starting 08/02/2018, Ref. x2. Local Order: Discontinue Lisinopril- cough Anemia of chronic disease (D63.8) Current Plans Started Ferrous Sulfate 325 (65 Fe)MG, 1 Tablet daily, Mail Order #270, 90 days starting 08/02/2018, Ref. x1. Hyperlipemia, mixed (E78.2) Current Plans Started Atorvastatin Calcium 80MG, 1 Tablet daily, Mail Order #90, 90 days starting 08/02/2018, Ref. x3. Laboratory examination (Y10.17) Story: 06/29/2018: RBC 3.29, Hgb 9.3, Hct 28.5, CBC otherwise normal. Glucose 124, creatinine 1.08,eGFR 67/77, potassium 4.7, CMP normal. Cholesterol 115, triglycerides 114, HDL 48, LDL  44.  Labs 04/13/2018: HB 8.6/HCT 26.9, normal indicis. Platelets 338. Serum glucose 154 mg, BUN 30, creatinine 1.29, EGFR 54/62 mL, potassium 4.2. CMP otherwise normal.  Labs 0/19/2019: HB 8.4/HCT 26.7, platelets 348, normal indicis. Serum glucose 137 mg, BUN 27, creatinine 1.19, EGFR 58 mL. A1c 6.4%. Total cholesterol 121, triglycerides 69, HDL 55, LDL 82.  Recommendations:  Cody Phelps is a Caucasian male with invasive prostate cancer diagnosed in Nov 2018 and is S/P chemo and RT and in remission, anemia of chronic disease and also mild iron defeciency, hypertension, hyperlipidemia, obstructive sleep apnea on CPAP, controlled diabetes mellitus, mild obesity, mostly truncal obesity, Late presentation of antero-lat MI on 03/01/2017. He has had multiple hospital admissions since then with acute decompensated heart failure.  He underwent aortic valve replacement along with LIMA to D1 on 05/30/2018 by Dr. Darylene Price with implantation of a bovine pericardial tissue valve along with cryoablation and clipping of left atrial appendage. He is now here for follow-up of chronic diastolic heart failure and management of atrial fibrillation and coronary artery disease.  Has again noticed weight gain and dyspnea. I have strictly discussed with the patient and his wife that his dyspnea is related to gain weight gain and acute precipitation of CHF. Restart lasix and I have discontinued lisinopril and switched him to valsartan HCT, once leg edema and symptoms of dyspnea has improved, he can take Lasix on a p.r.n. basis. I'll obtain BMP in 2-3 weeks and would like to see him back in 4-6 weeks for close monitoring. I reviewed his echocardiogram which essentially reveals normal LVEF without wall motion abnormality and prosthetic aortic valve is functioning normally. He is now on Xarelto due to difficulty in maintaining therapeutic INR.  Some of the symptoms of fatigue and dyspnea may also be related to  underlying atypical atrial flutter, we'll schedule him for direct current cardioversion. Continue amiodarone and anticoagulation for now.  CC: Dr. Roque Cash, MD; Cody Cove, MD (CT surgery).   Signed by Cody Page, MD (08/02/2018 5:32 PM)

## 2018-08-10 ENCOUNTER — Encounter (HOSPITAL_COMMUNITY)
Admission: RE | Disposition: A | Discharge status: Home / Self Care | Payer: Self-pay | Source: Home / Self Care | Attending: Cardiology

## 2018-08-10 ENCOUNTER — Ambulatory Visit (HOSPITAL_COMMUNITY): Payer: Medicare Other | Admitting: Anesthesiology

## 2018-08-10 ENCOUNTER — Encounter (HOSPITAL_COMMUNITY): Payer: Self-pay

## 2018-08-10 ENCOUNTER — Ambulatory Visit (HOSPITAL_COMMUNITY)
Admission: RE | Admit: 2018-08-10 | Discharge: 2018-08-10 | Disposition: A | Discharge status: Home / Self Care | Payer: Medicare Other | Attending: Cardiology | Admitting: Cardiology

## 2018-08-10 DIAGNOSIS — I480 Paroxysmal atrial fibrillation: Secondary | ICD-10-CM | POA: Diagnosis present

## 2018-08-10 DIAGNOSIS — K219 Gastro-esophageal reflux disease without esophagitis: Secondary | ICD-10-CM | POA: Insufficient documentation

## 2018-08-10 DIAGNOSIS — E669 Obesity, unspecified: Secondary | ICD-10-CM | POA: Diagnosis not present

## 2018-08-10 DIAGNOSIS — Z794 Long term (current) use of insulin: Secondary | ICD-10-CM | POA: Insufficient documentation

## 2018-08-10 DIAGNOSIS — Z6829 Body mass index (BMI) 29.0-29.9, adult: Secondary | ICD-10-CM | POA: Insufficient documentation

## 2018-08-10 DIAGNOSIS — G4733 Obstructive sleep apnea (adult) (pediatric): Secondary | ICD-10-CM | POA: Insufficient documentation

## 2018-08-10 DIAGNOSIS — Z951 Presence of aortocoronary bypass graft: Secondary | ICD-10-CM | POA: Insufficient documentation

## 2018-08-10 DIAGNOSIS — Z79899 Other long term (current) drug therapy: Secondary | ICD-10-CM | POA: Insufficient documentation

## 2018-08-10 DIAGNOSIS — I110 Hypertensive heart disease with heart failure: Secondary | ICD-10-CM | POA: Diagnosis not present

## 2018-08-10 DIAGNOSIS — I2510 Atherosclerotic heart disease of native coronary artery without angina pectoris: Secondary | ICD-10-CM | POA: Diagnosis not present

## 2018-08-10 DIAGNOSIS — Z7901 Long term (current) use of anticoagulants: Secondary | ICD-10-CM | POA: Diagnosis not present

## 2018-08-10 DIAGNOSIS — I484 Atypical atrial flutter: Secondary | ICD-10-CM | POA: Diagnosis not present

## 2018-08-10 DIAGNOSIS — I252 Old myocardial infarction: Secondary | ICD-10-CM | POA: Diagnosis not present

## 2018-08-10 DIAGNOSIS — G2581 Restless legs syndrome: Secondary | ICD-10-CM | POA: Diagnosis not present

## 2018-08-10 DIAGNOSIS — M1610 Unilateral primary osteoarthritis, unspecified hip: Secondary | ICD-10-CM | POA: Diagnosis not present

## 2018-08-10 DIAGNOSIS — Z823 Family history of stroke: Secondary | ICD-10-CM | POA: Insufficient documentation

## 2018-08-10 DIAGNOSIS — Z8249 Family history of ischemic heart disease and other diseases of the circulatory system: Secondary | ICD-10-CM | POA: Diagnosis not present

## 2018-08-10 DIAGNOSIS — E782 Mixed hyperlipidemia: Secondary | ICD-10-CM | POA: Diagnosis not present

## 2018-08-10 DIAGNOSIS — Z953 Presence of xenogenic heart valve: Secondary | ICD-10-CM | POA: Insufficient documentation

## 2018-08-10 DIAGNOSIS — I6522 Occlusion and stenosis of left carotid artery: Secondary | ICD-10-CM | POA: Diagnosis not present

## 2018-08-10 DIAGNOSIS — I5032 Chronic diastolic (congestive) heart failure: Secondary | ICD-10-CM | POA: Diagnosis not present

## 2018-08-10 DIAGNOSIS — Z955 Presence of coronary angioplasty implant and graft: Secondary | ICD-10-CM | POA: Diagnosis not present

## 2018-08-10 DIAGNOSIS — Z87891 Personal history of nicotine dependence: Secondary | ICD-10-CM | POA: Insufficient documentation

## 2018-08-10 HISTORY — PX: CARDIOVERSION: SHX1299

## 2018-08-10 LAB — GLUCOSE, CAPILLARY: Glucose-Capillary: 123 mg/dL — ABNORMAL HIGH (ref 70–99)

## 2018-08-10 SURGERY — CARDIOVERSION
Anesthesia: General

## 2018-08-10 MED ORDER — LIDOCAINE HCL (CARDIAC) PF 100 MG/5ML IV SOSY
PREFILLED_SYRINGE | INTRAVENOUS | Status: DC | PRN
  Administered 2018-08-10: 20 mg via INTRAVENOUS

## 2018-08-10 MED ORDER — PROPOFOL 10 MG/ML IV BOLUS
INTRAVENOUS | Status: DC | PRN
  Administered 2018-08-10: 80 mg via INTRAVENOUS

## 2018-08-10 MED ORDER — SODIUM CHLORIDE 0.9 % IV SOLN
INTRAVENOUS | Status: DC | PRN
  Administered 2018-08-10: 11:00:00 via INTRAVENOUS

## 2018-08-10 NOTE — Anesthesia Postprocedure Evaluation (Signed)
Anesthesia Post Note  Patient: Cody Phelps  Procedure(s) Performed: CARDIOVERSION (N/A )     Patient location during evaluation: Endoscopy Anesthesia Type: General Level of consciousness: awake and alert, patient cooperative and oriented Pain management: pain level controlled Vital Signs Assessment: post-procedure vital signs reviewed and stable Respiratory status: spontaneous breathing, nonlabored ventilation and respiratory function stable Cardiovascular status: blood pressure returned to baseline and stable Postop Assessment: no apparent nausea or vomiting Anesthetic complications: no    Last Vitals:  Vitals:   08/10/18 1128 08/10/18 1129  BP:  (!) 146/56  Pulse:  73  Resp:  15  Temp: 37.1 C   SpO2:  98%    Last Pain:  Vitals:   08/10/18 1127  TempSrc:   PainSc: 0-No pain                 Michaelia Beilfuss,E. Eisley Barber

## 2018-08-10 NOTE — Anesthesia Preprocedure Evaluation (Addendum)
Anesthesia Evaluation  Patient identified by MRN, date of birth, ID band Patient awake    Reviewed: Allergy & Precautions, NPO status , Patient's Chart, lab work & pertinent test results, reviewed documented beta blocker date and time   History of Anesthesia Complications Negative for: history of anesthetic complications  Airway Mallampati: II  TM Distance: >3 FB Neck ROM: Full    Dental  (+) Dental Advisory Given   Pulmonary sleep apnea and Continuous Positive Airway Pressure Ventilation , former smoker,    breath sounds clear to auscultation       Cardiovascular hypertension, Pt. on medications and Pt. on home beta blockers (-) angina+ CAD and + CABG  + dysrhythmias Atrial Fibrillation + Valvular Problems/Murmurs (now s/p AVR) AS  Rhythm:Irregular Rate:Normal     Neuro/Psych negative neurological ROS     GI/Hepatic Neg liver ROS, GERD  Medicated and Controlled,  Endo/Other  diabetes (glu 123), Insulin Dependent  Renal/GU negative Renal ROS     Musculoskeletal   Abdominal   Peds  Hematology xarelto   Anesthesia Other Findings Prostate cancer  Reproductive/Obstetrics                            Anesthesia Physical Anesthesia Plan  ASA: III  Anesthesia Plan: General   Post-op Pain Management:    Induction: Intravenous  PONV Risk Score and Plan: 2 and Treatment may vary due to age or medical condition  Airway Management Planned: Natural Airway and Mask  Additional Equipment:   Intra-op Plan:   Post-operative Plan:   Informed Consent: I have reviewed the patients History and Physical, chart, labs and discussed the procedure including the risks, benefits and alternatives for the proposed anesthesia with the patient or authorized representative who has indicated his/her understanding and acceptance.   Dental advisory given  Plan Discussed with: CRNA and Surgeon  Anesthesia Plan  Comments:        Anesthesia Quick Evaluation

## 2018-08-10 NOTE — CV Procedure (Signed)
Direct current cardioversion:  Indication symptomatic A. Fibrillation/atypical A. Flutter.  Procedure: Using 80 mg of IV Propofol and 20 IV Lidocaine (for reducing venous pain) for achieving deep sedation, synchronized direct current cardioversion performed. Patient was delivered with 100 Joules of electricity X 1 with success to NSR. Patient tolerated the procedure well. No immediate complication noted.

## 2018-08-10 NOTE — Interval H&P Note (Signed)
History and Physical Interval Note:  08/10/2018 10:42 AM  Cody Phelps  has presented today for surgery, with the diagnosis of atrial flutter  The various methods of treatment have been discussed with the patient and family. After consideration of risks, benefits and other options for treatment, the patient has consented to  Procedure(s): CARDIOVERSION (N/A) as a surgical intervention .  The patient's history has been reviewed, patient examined, no change in status, stable for surgery.  I have reviewed the patient's chart and labs.  Questions were answered to the patient's satisfaction.     Adrian Prows

## 2018-08-10 NOTE — Transfer of Care (Signed)
Immediate Anesthesia Transfer of Care Note  Patient: Cody Phelps  Procedure(s) Performed: CARDIOVERSION (N/A )  Patient Location: Endoscopy Unit  Anesthesia Type:General  Level of Consciousness: awake and drowsy  Airway & Oxygen Therapy: Patient Spontanous Breathing  Post-op Assessment: Report given to RN, Post -op Vital signs reviewed and stable and Patient moving all extremities X 4  Post vital signs: Reviewed and stable  Last Vitals:  Vitals Value Taken Time  BP    Temp    Pulse    Resp    SpO2      Last Pain:  Vitals:   08/10/18 1004  TempSrc: Oral  PainSc: 0-No pain         Complications: No apparent anesthesia complications

## 2018-08-10 NOTE — Discharge Instructions (Signed)
Electrical Cardioversion, Care After °This sheet gives you information about how to care for yourself after your procedure. Your health care provider may also give you more specific instructions. If you have problems or questions, contact your health care provider. °What can I expect after the procedure? °After the procedure, it is common to have: °· Some redness on the skin where the shocks were given. °Follow these instructions at home: ° °· Do not drive for 24 hours if you were given a medicine to help you relax (sedative). °· Take over-the-counter and prescription medicines only as told by your health care provider. °· Ask your health care provider how to check your pulse. Check it often. °· Rest for 48 hours after the procedure or as told by your health care provider. °· Avoid or limit your caffeine use as told by your health care provider. °Contact a health care provider if: °· You feel like your heart is beating too quickly or your pulse is not regular. °· You have a serious muscle cramp that does not go away. °Get help right away if: ° °· You have discomfort in your chest. °· You are dizzy or you feel faint. °· You have trouble breathing or you are short of breath. °· Your speech is slurred. °· You have trouble moving an arm or leg on one side of your body. °· Your fingers or toes turn cold or blue. °This information is not intended to replace advice given to you by your health care provider. Make sure you discuss any questions you have with your health care provider. °Document Released: 05/08/2013 Document Revised: 02/19/2016 Document Reviewed: 01/22/2016 °Elsevier Interactive Patient Education © 2019 [employer] ° °

## 2018-08-12 ENCOUNTER — Encounter (HOSPITAL_COMMUNITY): Payer: Self-pay | Admitting: Cardiology

## 2018-08-13 ENCOUNTER — Telehealth (HOSPITAL_COMMUNITY): Payer: Self-pay | Admitting: Pharmacist

## 2018-08-14 NOTE — Telephone Encounter (Signed)
Cardiac Rehab Medication Review by a Pharmacist  Does the patient  feel that his/her medications are working for him/her?  yes  Has the patient been experiencing any side effects to the medications prescribed?  no  Does the patient measure his/her own blood pressure or blood glucose at home?  yes - Checks BG levels but wife cannot recall what his levels have been recently. Does not check blood pressure at home.   Does the patient have any problems obtaining medications due to transportation or finances?   no  Understanding of regimen: good Understanding of indications: good Potential of compliance: good  Gwenlyn Found, Sherian Rein D PGY1 Pharmacy Resident  Phone [phone] 08/14/2018   3:02 PM

## 2018-08-16 NOTE — Progress Notes (Signed)
Cody Phelps 78 y.o. male DOB 1940/12/01 MRN [id number]       Nutrition  No diagnosis found. Past Medical History:  Diagnosis Date  . Carotid stenosis, left followed by dr [name], cardiologist--- bilateral bruit per note   per dr [name] note 09/7739  LICA 28-78% stenosis per duplex 05-14-2014  . Diverticulosis   . ED (erectile dysfunction)   . First degree heart block   . GERD (gastroesophageal reflux disease)   . Heart murmur   . Hypertension    cardiologsit-  dr [name]  . Iron deficiency anemia   . Mixed hyperlipidemia   . Morbid obesity ([place])   . Myocardial infarction ([place])    02/28/18  . OA (osteoarthritis)    right hip  . OSA on CPAP    followed by dr dohmeier, uses CPAP  . PAF (paroxysmal atrial fibrillation) ([name])    a. on Eliquis  . Prostate cancer ([place])    dx 03-17-2017 (bx)  Stage T2a, Gleason 4+4, PSA  4.43, vol 32.32--  s/p radiatctive prostate seed implants 07-10-2017 then IMRT and ADT  . RLS (restless legs syndrome)   . S/P aortic valve replacement with bioprosthetic valve 05/30/2018   23 mm Edwards Inspiris Resilia stented bovine pericardial tissue valve  . 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 S/P Maze operation for atrial fibrillation 05/30/2018   Complete bilateral atrial lesion set using bipolar radiofrequency and cryothermy ablation with clipping of LA appendage  . Scoliosis   . Severe aortic stenosis   . Trigger finger   . Type 2 diabetes mellitus ([place])   . Wears partial dentures    upper and lower   Meds reviewed.    Current Outpatient Medications (Endocrine & Metabolic):  .  insulin glargine (LANTUS SOLOSTAR) 100 UNIT/ML injection, Inject 20-44 Units into the skin at bedtime as needed (for BGL >150).   Current Outpatient Medications (Cardiovascular):  .  amiodarone (PACERONE) 200 MG tablet, Take 1 tablet (200 mg total) by mouth daily. Marland Kitchen  atorvastatin (LIPITOR) 80 MG tablet, Take 1 tablet (80 mg total) by mouth daily at 6 PM. (Patient  taking differently: Take 80 mg by mouth every evening. ) .  colesevelam (WELCHOL) 625 MG tablet, Take 625 mg by mouth 3 (three) times daily.  .  metoprolol tartrate (LOPRESSOR) 25 MG tablet, Take 0.5 tablets (12.5 mg total) by mouth 2 (two) times daily. Marland Kitchen  omega-3 acid ethyl esters (LOVAZA) 1 G capsule, Take 2 g by mouth daily.  Marland Kitchen  spironolactone (ALDACTONE) 25 MG tablet, Take 0.5 tablets (12.5 mg total) by mouth daily. Marland Kitchen  torsemide (DEMADEX) 20 MG tablet, Take 1 tablet (20 mg total) by mouth daily. (Patient not taking: Reported on 08/14/2018) .  valsartan-hydrochlorothiazide (DIOVAN-HCT) 80-12.5 MG tablet, Take 1 tablet by mouth daily.   Current Outpatient Medications (Analgesics):  .  acetaminophen (TYLENOL) 325 MG tablet, Take 1 tablet (325 mg total) by mouth every 4 (four) hours as needed (headache, pain). (Patient not taking: Reported on 08/14/2018)  Current Outpatient Medications (Hematological):  .  ferrous sulfate 325 (65 FE) MG tablet, Take 1 tablet (325 mg total) by mouth 3 (three) times daily with meals. (Patient taking differently: Take 325 mg by mouth daily with breakfast. ) .  Rivaroxaban (XARELTO) 15 MG TABS tablet, Take 15 mg by mouth daily.   Current Outpatient Medications (Other):  .  donepezil (ARICEPT) 10 MG tablet, Take 10 mg by mouth every morning.  Marland Kitchen  esomeprazole (NEXIUM) 40 MG capsule, Take 40 mg by mouth daily.  .  memantine (NAMENDA) 5 MG tablet, Take 5 mg by mouth 2 (two) times daily. .  mirabegron ER (MYRBETRIQ) 50 MG TB24 tablet, Take 50 mg by mouth every evening.  .  potassium chloride SA (K-DUR,KLOR-CON) 20 MEQ tablet, Take 1 tablet (20 mEq total) by mouth daily with breakfast. (Patient not taking: Reported on 08/14/2018) .  rOPINIRole (REQUIP) 2 MG tablet, Take 2 mg by mouth 2 (two) times daily. 1600 & at bedtime .  tamsulosin (FLOMAX) 0.4 MG CAPS capsule, Take 0.4 mg by mouth daily after supper.    HT: Ht Readings from Last 1 Encounters:  08/10/18 5\' 8"   (1.727 m)    WT: Wt Readings from Last 5 Encounters:  08/10/18 197 lb (89.4 kg)  06/26/18 194 lb (88 kg)  06/04/18 202 lb 9.6 oz (91.9 kg)  05/29/18 198 lb (89.8 kg)  05/28/18 198 lb 14.4 oz (90.2 kg)     BMI = 29.5  (06/26/18)   Current tobacco use? No       Labs:  Lipid Panel     Component Value Date/Time   CHOL 151 03/01/2018 0514   TRIG 69 03/01/2018 0514   HDL 55 03/01/2018 0514   CHOLHDL 2.7 03/01/2018 0514   VLDL 14 03/01/2018 0514   LDLCALC 82 03/01/2018 0514    Lab Results  Component Value Date   HGBA1C 5.8 (H) 05/28/2018   CBG (last 3)  No results for input(s): GLUCAP in the last 72 hours.  Nutrition Diagnosis ? Food-and nutrition-related knowledge deficit related to lack of exposure to information as related to diagnosis of: ? CVD ? Type 2 Diabetes ? Overweight  related to excessive energy intake as evidenced by a BMI = 29.5  (06/26/18)  Nutrition Goal(s):  ? To be determined  Plan:  Pt to attend nutrition classes ? Nutrition I ? Nutrition II ? Portion Distortion  Will provide client-centered nutrition education as part of interdisciplinary care.   Monitor and evaluate progress toward nutrition goal with team.  Laurina Bustle, MS, RD, LDN 08/16/2018 8:11 PM

## 2018-08-17 ENCOUNTER — Other Ambulatory Visit: Payer: Self-pay | Admitting: Thoracic Surgery (Cardiothoracic Vascular Surgery)

## 2018-08-17 DIAGNOSIS — Z951 Presence of aortocoronary bypass graft: Secondary | ICD-10-CM

## 2018-08-20 ENCOUNTER — Ambulatory Visit (INDEPENDENT_AMBULATORY_CARE_PROVIDER_SITE_OTHER): Payer: Self-pay | Admitting: Thoracic Surgery (Cardiothoracic Vascular Surgery)

## 2018-08-20 ENCOUNTER — Ambulatory Visit
Admission: RE | Admit: 2018-08-20 | Discharge: 2018-08-20 | Disposition: A | Discharge status: Home / Self Care | Payer: Medicare Other | Source: Ambulatory Visit | Attending: Thoracic Surgery (Cardiothoracic Vascular Surgery) | Admitting: Thoracic Surgery (Cardiothoracic Vascular Surgery)

## 2018-08-20 ENCOUNTER — Other Ambulatory Visit: Payer: Self-pay

## 2018-08-20 ENCOUNTER — Encounter: Payer: Self-pay | Admitting: Thoracic Surgery (Cardiothoracic Vascular Surgery)

## 2018-08-20 VITALS — BP 142/74 | HR 82 | Resp 18 | Ht 68.0 in | Wt 198.0 lb

## 2018-08-20 DIAGNOSIS — Z951 Presence of aortocoronary bypass graft: Secondary | ICD-10-CM

## 2018-08-20 DIAGNOSIS — Z9889 Other specified postprocedural states: Secondary | ICD-10-CM

## 2018-08-20 DIAGNOSIS — Z8679 Personal history of other diseases of the circulatory system: Secondary | ICD-10-CM

## 2018-08-20 DIAGNOSIS — Z953 Presence of xenogenic heart valve: Secondary | ICD-10-CM

## 2018-08-20 NOTE — Progress Notes (Addendum)
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  His early postoperative recovery in the hospital was entirely uneventful, although when he was last seen here in our office on June 26, 2018 he had just gone back into atrial fibrillation.  He has been followed carefully by Dr. [name] and ultimately underwent DC cardioversion on August 10, 2018.  He returns for office today for routine follow-up and reports that he is feeling remarkably well.  He is back to essentially normal physical activity although he admits that he has not been pushing himself too much.  He has started going to a gym recently and he plans to get started in the cardiac rehab program this week.  He reports feeling much better since he underwent cardioversion and to his knowledge he has been maintaining regular rhythm ever since.  He reports no exertional shortness of breath.  He no longer has any significant soreness in his chest.   Current Outpatient Medications  Medication Sig Dispense Refill  . acetaminophen (TYLENOL) 325 MG tablet Take 1 tablet (325 mg total) by mouth every 4 (four) hours as needed (headache, pain). 60 tablet 3  . amiodarone (PACERONE) 200 MG  tablet Take 1 tablet (200 mg total) by mouth daily. 30 tablet 1  . atorvastatin (LIPITOR) 80 MG tablet Take 1 tablet (80 mg total) by mouth daily at 6 PM. (Patient taking differently: Take 80 mg by mouth every evening. ) 60 tablet 3  . colesevelam (WELCHOL) 625 MG tablet Take 625 mg by mouth 3 (three) times daily.     Marland Kitchen donepezil (ARICEPT) 10 MG tablet Take 10 mg by mouth every morning.     Marland Kitchen esomeprazole (NEXIUM) 40 MG capsule Take 40 mg by mouth daily.     . ferrous sulfate 325 (65 FE) MG tablet Take 1 tablet (325 mg total) by mouth 3 (three) times daily with meals. (Patient taking differently: Take 325 mg by mouth daily with breakfast. ) 60 tablet 3  . insulin glargine (LANTUS SOLOSTAR) 100 UNIT/ML injection Inject 20-44 Units into the skin at bedtime as needed (for BGL >150).     . memantine (NAMENDA) 5 MG tablet Take 5 mg by mouth 2 (two) times daily.    . metoprolol tartrate (LOPRESSOR) 25 MG tablet Take 0.5 tablets (12.5 mg total) by mouth 2 (two) times daily. 60 tablet 3  . mirabegron ER (MYRBETRIQ) 50 MG TB24 tablet Take 50 mg by mouth every evening.     Marland Kitchen omega-3 acid ethyl esters (LOVAZA) 1 G capsule Take 2 g by mouth daily.     . potassium chloride SA (K-DUR,KLOR-CON) 20 MEQ tablet Take 1 tablet (20 mEq total) by mouth daily with breakfast. 30 tablet 1  . Rivaroxaban (  XARELTO) 15 MG TABS tablet Take 15 mg by mouth daily.     Marland Kitchen rOPINIRole (REQUIP) 2 MG tablet Take 2 mg by mouth 2 (two) times daily. 1600 & at bedtime    . spironolactone (ALDACTONE) 25 MG tablet Take 0.5 tablets (12.5 mg total) by mouth daily. 30 tablet 3  . tamsulosin (FLOMAX) 0.4 MG CAPS capsule Take 0.4 mg by mouth daily after supper.     . torsemide (DEMADEX) 20 MG tablet Take 1 tablet (20 mg total) by mouth daily. 30 tablet 1  . valsartan-hydrochlorothiazide (DIOVAN-HCT) 80-12.5 MG tablet Take 1 tablet by mouth daily.     No current facility-administered medications for this visit.       Physical Exam:   BP (!)  142/74 (BP Location: Right Arm, Patient Position: Sitting, Cuff Size: Normal)   Pulse 82   Resp 18   Ht 5\' 8"  (1.727 m)   Wt 198 lb (89.8 kg)   SpO2 98% Comment: RA  BMI 30.11 kg/m   General:  Well-appearing  Chest:   Clear to auscultation  CV:   Regular rate and rhythm without murmur  Incisions:  Well-healed, sternum is stable  Abdomen:  Soft nontender  Extremities:  Warm and well-perfused  Diagnostic Tests:  2 channel telemetry rhythm strip demonstrates normal sinus rhythm    CHEST - 2 VIEW  COMPARISON:  06/26/2018  FINDINGS: Cardiac shadow is stable. Postsurgical changes are again seen. The lungs are well aerated bilaterally. No focal infiltrate or sizable effusion is seen. Mild degenerative changes of the thoracic spine are noted.  IMPRESSION: No acute abnormality seen.   Electronically Signed   By: Inez Catalina M.D.   On: 08/20/2018 13:55    Impression:  Patient is doing very well nearly 3 months status post aortic valve replacement using a bioprosthetic tissue valve, coronary artery bypass grafting, and Maze procedure.  He did develop recurrent atrial fibrillation after hospital discharge but he recently underwent DC cardioversion and has been maintaining sinus rhythm ever since.    Plan:  We have not recommended any change the patient's current medications.  I have encouraged the patient to continue to gradually increase his physical activity without any particular limitations at this time.  I have encouraged him to participate in the cardiac rehab program.  The patient has been reminded regarding the importance of dental hygiene and the lifelong need for antibiotic prophylaxis for all dental cleanings and other related invasive procedures.  We will request a copy of report from the routine follow-up echocardiogram reportedly performed recently at Dr. Irven Shelling office.  The patient will continue to follow-up regularly with Dr. [name] and return to our office  next fall, approximately 1 year following his surgery.  He will call and return sooner should specific problems or questions arise.     Valentina Gu. Roxy Manns, MD 08/20/2018 2:56 PM   Addendum:  Report from transthoracic echocardiogram performed July 11, 2018 at [facility] cardiovascular is reviewed.  By report there was normal left ventricular systolic function with ejection fraction estimated 65%.  The bioprosthetic tissue valve in the aortic position was well-seated and functioning normally.  Peak velocity across the aortic valve was measured 2.16 m/s corresponding to mean transvalvular gradient estimated 8 mmHg.  There was reportedly moderate mitral regurgitation.  [name], MD 08/20/2018 3:52 PM

## 2018-08-20 NOTE — Patient Instructions (Signed)
Continue all previous medications without any changes at this time  You are encouraged to enroll and participate in the outpatient cardiac rehab program beginning as soon as practical.  You may resume unrestricted physical activity without any particular limitations at this time.  Endocarditis is a potentially serious infection of heart valves or inside lining of the heart.  It occurs more commonly in patients with diseased heart valves (such as patient's with aortic or mitral valve disease) and in patients who have undergone heart valve repair or replacement.  Certain surgical and dental procedures may put you at risk, such as dental cleaning, other dental procedures, or any surgery involving the respiratory, urinary, gastrointestinal tract, gallbladder or prostate gland.   To minimize your chances for develooping endocarditis, maintain good oral health and seek prompt medical attention for any infections involving the mouth, teeth, gums, skin or urinary tract.    Always notify your doctor or dentist about your underlying heart valve condition before having any invasive procedures. You will need to take antibiotics before certain procedures, including all routine dental cleanings or other dental procedures.  Your cardiologist or dentist should prescribe these antibiotics for you to be taken ahead of time.

## 2018-08-21 ENCOUNTER — Encounter (HOSPITAL_COMMUNITY): Payer: Self-pay

## 2018-08-21 ENCOUNTER — Encounter (HOSPITAL_COMMUNITY)
Admission: RE | Admit: 2018-08-21 | Discharge: 2018-08-21 | Disposition: A | Discharge status: Home / Self Care | Payer: Medicare Other | Source: Ambulatory Visit | Attending: Cardiology | Admitting: Cardiology

## 2018-08-21 VITALS — Ht 66.75 in | Wt 205.9 lb

## 2018-08-21 DIAGNOSIS — Z952 Presence of prosthetic heart valve: Secondary | ICD-10-CM

## 2018-08-21 DIAGNOSIS — Z951 Presence of aortocoronary bypass graft: Secondary | ICD-10-CM

## 2018-08-21 NOTE — Progress Notes (Signed)
Cardiac Individual Treatment Plan  Patient Details  Name: Cody Phelps MRN: [id number] Date of Birth: 04/14/1941 Referring Provider:   Flowsheet Row CARDIAC REHAB PHASE II ORIENTATION from 08/21/2018 in [facility] [place]  Referring Provider  Dr. [name]      Initial Encounter Date:  Flowsheet Row CARDIAC REHAB PHASE II ORIENTATION from 08/21/2018 in [place]  Date  08/21/18      Visit Diagnosis: 05/30/18 AVR  05/30/18 CABG x1  Patient's Home Medications on Admission:  Current Outpatient Medications:  .  acetaminophen (TYLENOL) 325 MG tablet, Take 1 tablet (325 mg total) by mouth every 4 (four) hours as needed (headache, pain)., Disp: 60 tablet, Rfl: 3 .  amiodarone (PACERONE) 200 MG tablet, Take 1 tablet (200 mg total) by mouth daily., Disp: 30 tablet, Rfl: 1 .  atorvastatin (LIPITOR) 80 MG tablet, Take 1 tablet (80 mg total) by mouth daily at 6 PM. (Patient taking differently: Take 80 mg by mouth every evening. ), Disp: 60 tablet, Rfl: 3 .  colesevelam (WELCHOL) 625 MG tablet, Take 625 mg by mouth 3 (three) times daily. , Disp: , Rfl:  .  donepezil (ARICEPT) 10 MG tablet, Take 10 mg by mouth every morning. , Disp: , Rfl:  .  esomeprazole (NEXIUM) 40 MG capsule, Take 40 mg by mouth daily. , Disp: , Rfl:  .  ferrous sulfate 325 (65 FE) MG tablet, Take 1 tablet (325 mg total) by mouth 3 (three) times daily with meals. (Patient taking differently: Take 325 mg by mouth daily with breakfast. ), Disp: 60 tablet, Rfl: 3 .  insulin glargine (LANTUS SOLOSTAR) 100 UNIT/ML injection, Inject 20-44 Units into the skin at bedtime as needed (for BGL >150). , Disp: , Rfl:  .  memantine (NAMENDA) 5 MG tablet, Take 5 mg by mouth 2 (two) times daily., Disp: , Rfl:  .  metoprolol tartrate (LOPRESSOR) 25 MG tablet, Take 0.5 tablets (12.5 mg total) by mouth 2 (two) times daily., Disp: 60 tablet, Rfl: 3 .  mirabegron ER (MYRBETRIQ) 50 MG TB24 tablet,  Take 50 mg by mouth every evening. , Disp: , Rfl:  .  omega-3 acid ethyl esters (LOVAZA) 1 G capsule, Take 2 g by mouth daily. , Disp: , Rfl:  .  potassium chloride SA (K-DUR,KLOR-CON) 20 MEQ tablet, Take 1 tablet (20 mEq total) by mouth daily with breakfast., Disp: 30 tablet, Rfl: 1 .  Rivaroxaban (XARELTO) 15 MG TABS tablet, Take 15 mg by mouth daily. , Disp: , Rfl:  .  rOPINIRole (REQUIP) 2 MG tablet, Take 2 mg by mouth 2 (two) times daily. 1600 & at bedtime, Disp: , Rfl:  .  spironolactone (ALDACTONE) 25 MG tablet, Take 0.5 tablets (12.5 mg total) by mouth daily., Disp: 30 tablet, Rfl: 3 .  tamsulosin (FLOMAX) 0.4 MG CAPS capsule, Take 0.4 mg by mouth daily after supper. , Disp: , Rfl:  .  torsemide (DEMADEX) 20 MG tablet, Take 1 tablet (20 mg total) by mouth daily., Disp: 30 tablet, Rfl: 1 .  valsartan-hydrochlorothiazide (DIOVAN-HCT) 80-12.5 MG tablet, Take 1 tablet by mouth daily., Disp: , Rfl:   Past Medical History: Past Medical History:  Diagnosis Date  . Carotid stenosis, left followed by dr [name], cardiologist--- bilateral bruit per note   per dr [name] note [id number]  LICA 56-38% stenosis per duplex 05-14-2014  . Diverticulosis   . ED (erectile dysfunction)   . First degree heart block   . GERD (  gastroesophageal reflux disease)   . Heart murmur   . Hypertension    cardiologsit-  dr [name]  . Iron deficiency anemia   . Mixed hyperlipidemia   . Morbid obesity ([place])   . Myocardial infarction ([place])    02/28/18  . OA (osteoarthritis)    right hip  . OSA on CPAP    followed by dr dohmeier, uses CPAP  . PAF (paroxysmal atrial fibrillation) ([name])    a. on Eliquis  . Prostate cancer ([place])    dx 03-17-2017 (bx)  Stage T2a, Gleason 4+4, PSA  4.43, vol 32.32--  s/p radiatctive prostate seed implants 07-10-2017 then IMRT and ADT  . RLS (restless legs syndrome)   . 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 S/P Maze operation for atrial fibrillation 05/30/2018   Complete bilateral atrial lesion set using bipolar radiofrequency and cryothermy ablation with clipping of LA appendage  . Scoliosis   . Severe aortic stenosis   . Trigger finger   . Type 2 diabetes mellitus ([place])   . Wears partial dentures    upper and lower    Tobacco Use: Social History   Tobacco Use  Smoking Status Former Smoker  . Years: 1.00  . Types: Cigarettes  . Last attempt to quit: 02/04/1967  . Years since quitting: 51.5  Smokeless Tobacco Never Used  Tobacco Comment   smoked 1 q2-3 days    Labs: Recent Review Flowsheet Data    Labs for ITP Cardiac and Pulmonary Rehab Latest Ref Rng & Units 05/30/2018 05/30/2018 05/30/2018 05/31/2018 06/01/2018   Cholestrol 0 - 200 mg/dL - - - - -   LDLCALC 0 - 99 mg/dL - - - - -   HDL >40 mg/dL - - - - -   Trlycerides <150 mg/dL - - - - -   Hemoglobin A1c 4.8 - 5.6 % - - - - -   PHART 7.350 - 7.450 7.336(L) 7.325(L) - - -   PCO2ART 32.0 - 48.0 mmHg 42.8 42.7 - - -   HCO3 20.0 - 28.0 mmol/L 22.7 22.1 - - -   TCO2 22 - 32 mmol/L 24 23 23  - -   ACIDBASEDEF 0.0 - 2.0 mmol/L 3.0(H) 4.0(H) - - -   O2SAT % 99.0 97.0 - 55.7 75.4      Capillary Blood Glucose: Lab Results  Component Value Date   GLUCAP 123 (H) 08/10/2018   GLUCAP 141 (H) 06/04/2018   GLUCAP 109 (H) 06/04/2018   GLUCAP 97 06/03/2018   GLUCAP 130 (H) 06/03/2018     Exercise Target Goals: Exercise Program Goal: Individual exercise prescription set using results from initial 6 min walk test and THRR while considering  patient's activity barriers and safety.   Exercise Prescription Goal: Initial exercise prescription builds to 30-45 minutes a day of aerobic activity, 2-3 days per week.  Home exercise guidelines will be given to patient during program as part of exercise prescription that the participant will  acknowledge.  Activity Barriers & Risk Stratification: Activity Barriers & Cardiac Risk Stratification - 08/21/18 0937    Activity Barriers & Cardiac Risk Stratification          Activity Barriers  Right Hip Replacement;Other (comment)    Comments  Previous back and ankle surgery    Cardiac Risk Stratification  High  6 Minute Walk: 6 Minute Walk    6 Minute Walk    Row Name 08/21/18 1122   Phase  Initial   Distance  1069 feet   Walk Time  6 minutes   # of Rest Breaks  0   MPH  2.02   METS  2.02   RPE  13   Symptoms  No   Resting HR  85 bpm   Resting BP  122/64   Resting Oxygen Saturation   99 %   Exercise Oxygen Saturation  during 6 min walk  100 %   Max Ex. HR  110 bpm   Max Ex. BP  130/74   2 Minute Post BP  130/70          Oxygen Initial Assessment:   Oxygen Re-Evaluation:   Oxygen Discharge (Final Oxygen Re-Evaluation):   Initial Exercise Prescription: Initial Exercise Prescription - 08/21/18 1100    Date of Initial Exercise RX and Referring Provider          Date  08/21/18    Referring Provider  Dr. [name]    Expected Discharge Date  11/26/18        NuStep          Level  2    SPM  75    Minutes  10    METs  2        Arm Ergometer          Level  1    Watts  30    Minutes  10    METs  2.75        Track          Laps  7    Minutes  10    METs  2        Prescription Details          Frequency (times per week)  3    Duration  Progress to 30 minutes of continuous aerobic without signs/symptoms of physical distress        Intensity          THRR 40-80% of Max Heartrate  58-116    Ratings of Perceived Exertion  11-13        Progression          Progression  Continue to progress workloads to maintain intensity without signs/symptoms of physical distress.        Resistance Training          Training Prescription  Yes    Weight  2 lbs.     Reps  10-15           Perform Capillary Blood Glucose checks as  needed.  Exercise Prescription Changes:   Exercise Comments:   Exercise Goals and Review: Exercise Goals    Exercise Goals    Row Name 08/21/18 0940   Increase Physical Activity  Yes   Intervention  Provide advice, education, support and counseling about physical activity/exercise needs.;Develop an individualized exercise prescription for aerobic and resistive training based on initial evaluation findings, risk stratification, comorbidities and participant's personal goals.   Expected Outcomes  Short Term: Attend rehab on a regular basis to increase amount of physical activity.;Long Term: Add in home exercise to make exercise part of routine and to increase amount of physical activity.;Long Term: Exercising regularly at least 3-5 days a week.   Increase Strength and Stamina  Yes   Intervention  Provide advice, education, support and counseling about physical  activity/exercise needs.;Develop an individualized exercise prescription for aerobic and resistive training based on initial evaluation findings, risk stratification, comorbidities and participant's personal goals.   Expected Outcomes  Short Term: Increase workloads from initial exercise prescription for resistance, speed, and METs.;Short Term: Perform resistance training exercises routinely during rehab and add in resistance training at home;Long Term: Improve cardiorespiratory fitness, muscular endurance and strength as measured by increased METs and functional capacity (6MWT)   Able to understand and use rate of perceived exertion (RPE) scale  Yes   Intervention  Provide education and explanation on how to use RPE scale   Expected Outcomes  Short Term: Able to use RPE daily in rehab to express subjective intensity level;Long Term:  Able to use RPE to guide intensity level when exercising independently   Knowledge and understanding of Target Heart Rate Range (THRR)  Yes   Intervention  Provide education and explanation of THRR including  how the numbers were predicted and where they are located for reference   Expected Outcomes  Short Term: Able to state/look up THRR;Long Term: Able to use THRR to govern intensity when exercising independently;Short Term: Able to use daily as guideline for intensity in rehab   Able to check pulse independently  Yes   Intervention  Provide education and demonstration on how to check pulse in carotid and radial arteries.;Review the importance of being able to check your own pulse for safety during independent exercise   Expected Outcomes  Short Term: Able to explain why pulse checking is important during independent exercise;Long Term: Able to check pulse independently and accurately   Understanding of Exercise Prescription  Yes   Intervention  Provide education, explanation, and written materials on patient's individual exercise prescription   Expected Outcomes  Short Term: Able to explain program exercise prescription;Long Term: Able to explain home exercise prescription to exercise independently          Exercise Goals Re-Evaluation :   Discharge Exercise Prescription (Final Exercise Prescription Changes):   Nutrition:  Target Goals: Understanding of nutrition guidelines, daily intake of sodium 1500mg , cholesterol 200mg , calories 30% from fat and 7% or less from saturated fats, daily to have 5 or more servings of fruits and vegetables.  Biometrics: Pre Biometrics - 08/21/18 1123    Pre Biometrics          Height  5' 6.75" (1.695 m)    Weight  93.4 kg    Waist Circumference  43.25 inches    Hip Circumference  42.5 inches    Waist to Hip Ratio  1.02 %    BMI (Calculated)  32.51    Triceps Skinfold  21 mm    % Body Fat  32.7 %    Grip Strength  18.5 kg    Flexibility  0 in    Single Leg Stand  0.56 seconds            Nutrition Therapy Plan and Nutrition Goals:   Nutrition Assessments:   Nutrition Goals Re-Evaluation:   Nutrition Goals Re-Evaluation:   Nutrition  Goals Discharge (Final Nutrition Goals Re-Evaluation):   Psychosocial: Target Goals: Acknowledge presence or absence of significant depression and/or stress, maximize coping skills, provide positive support system. Participant is able to verbalize types and ability to use techniques and skills needed for reducing stress and depression.  Initial Review & Psychosocial Screening: Initial Psych Review & Screening - 08/21/18 1200    Initial Review          Current issues with  None Identified;Current Stress Concerns    Source of Stress Concerns  Chronic Illness        Family Dynamics          Good Support System?  Yes   spouse, family        Barriers          Psychosocial barriers to participate in program  There are no identifiable barriers or psychosocial needs.        Screening Interventions          Interventions  Encouraged to exercise           Quality of Life Scores: Quality of Life - 08/21/18 1109    Quality of Life          Select  Quality of Life        Quality of Life Scores          Health/Function Pre  19 %    Socioeconomic Pre  23.5 %    Psych/Spiritual Pre  23.5 %    Family Pre  27.6 %    GLOBAL Pre  22.2 %          Scores of 19 and below usually indicate a poorer quality of life in these areas.  A difference of  2-3 points is a clinically meaningful difference.  A difference of 2-3 points in the total score of the Quality of Life Index has been associated with significant improvement in overall quality of life, self-image, physical symptoms, and general health in studies assessing change in quality of life.  PHQ-9: Recent Review Flowsheet Data    Depression screen [facility] 2/9 08/20/2018 05/21/2018 10/17/2017 08/04/2017   Decreased Interest 0 0 0 0   Down, Depressed, Hopeless 0 0 0 0   PHQ - 2 Score 0 0 0 0     Interpretation of Total Score  Total Score Depression Severity:  1-4 = Minimal depression, 5-9 = Mild depression, 10-14 = Moderate depression,  15-19 = Moderately severe depression, 20-27 = Severe depression   Psychosocial Evaluation and Intervention:   Psychosocial Re-Evaluation:   Psychosocial Discharge (Final Psychosocial Re-Evaluation):   Vocational Rehabilitation: Provide vocational rehab assistance to qualifying candidates.   Vocational Rehab Evaluation & Intervention:   Education: Education Goals: Education classes will be provided on a weekly basis, covering required topics. Participant will state understanding/return demonstration of topics presented.  Learning Barriers/Preferences: Learning Barriers/Preferences - 08/21/18 1125    Learning Barriers/Preferences          Learning Barriers  Hearing   dementia   Learning Preferences  Written Material           Education Topics: Count Your Pulse:  -Group instruction provided by verbal instruction, demonstration, patient participation and written materials to support subject.  Instructors address importance of being able to find your pulse and how to count your pulse when at home without a heart monitor.  Patients get hands on experience counting their pulse with staff help and individually.   Heart Attack, Angina, and Risk Factor Modification:  -Group instruction provided by verbal instruction, video, and written materials to support subject.  Instructors address signs and symptoms of angina and heart attacks.    Also discuss risk factors for heart disease and how to make changes to improve heart health risk factors.   Functional Fitness:  -Group instruction provided by verbal instruction, demonstration, patient participation, and written materials to support subject.  Instructors address safety measures for doing things around the  house.  Discuss how to get up and down off the floor, how to pick things up properly, how to safely get out of a chair without assistance, and balance training.   Meditation and Mindfulness:  -Group instruction provided by verbal  instruction, patient participation, and written materials to support subject.  Instructor addresses importance of mindfulness and meditation practice to help reduce stress and improve awareness.  Instructor also leads participants through a meditation exercise.    Stretching for Flexibility and Mobility:  -Group instruction provided by verbal instruction, patient participation, and written materials to support subject.  Instructors lead participants through series of stretches that are designed to increase flexibility thus improving mobility.  These stretches are additional exercise for major muscle groups that are typically performed during regular warm up and cool down.   Hands Only CPR:  -Group verbal, video, and participation provides a basic overview of AHA guidelines for community CPR. Role-play of emergencies allow participants the opportunity to practice calling for help and chest compression technique with discussion of AED use.   Hypertension: -Group verbal and written instruction that provides a basic overview of hypertension including the most recent diagnostic guidelines, risk factor reduction with self-care instructions and medication management.    Nutrition I class: Heart Healthy Eating:  -Group instruction provided by PowerPoint slides, verbal discussion, and written materials to support subject matter. The instructor gives an explanation and review of the Therapeutic Lifestyle Changes diet recommendations, which includes a discussion on lipid goals, dietary fat, sodium, fiber, plant stanol/sterol esters, sugar, and the components of a well-balanced, healthy diet.   Nutrition II class: Lifestyle Skills:  -Group instruction provided by PowerPoint slides, verbal discussion, and written materials to support subject matter. The instructor gives an explanation and review of label reading, grocery shopping for heart health, heart healthy recipe modifications, and ways to make  healthier choices when eating out.   Diabetes Question & Answer:  -Group instruction provided by PowerPoint slides, verbal discussion, and written materials to support subject matter. The instructor gives an explanation and review of diabetes co-morbidities, pre- and post-prandial blood glucose goals, pre-exercise blood glucose goals, signs, symptoms, and treatment of hypoglycemia and hyperglycemia, and foot care basics.   Diabetes Blitz:  -Group instruction provided by PowerPoint slides, verbal discussion, and written materials to support subject matter. The instructor gives an explanation and review of the physiology behind type 1 and type 2 diabetes, diabetes medications and rational behind using different medications, pre- and post-prandial blood glucose recommendations and Hemoglobin A1c goals, diabetes diet, and exercise including blood glucose guidelines for exercising safely.    Portion Distortion:  -Group instruction provided by PowerPoint slides, verbal discussion, written materials, and food models to support subject matter. The instructor gives an explanation of serving size versus portion size, changes in portions sizes over the last 20 years, and what consists of a serving from each food group.   Stress Management:  -Group instruction provided by verbal instruction, video, and written materials to support subject matter.  Instructors review role of stress in heart disease and how to cope with stress positively.     Exercising on Your Own:  -Group instruction provided by verbal instruction, power point, and written materials to support subject.  Instructors discuss benefits of exercise, components of exercise, frequency and intensity of exercise, and end points for exercise.  Also discuss use of nitroglycerin and activating EMS.  Review options of places to exercise outside of rehab.  Review guidelines for sex with heart  disease.   Cardiac Drugs I:  -Group instruction provided by  verbal instruction and written materials to support subject.  Instructor reviews cardiac drug classes: antiplatelets, anticoagulants, beta blockers, and statins.  Instructor discusses reasons, side effects, and lifestyle considerations for each drug class.   Cardiac Drugs II:  -Group instruction provided by verbal instruction and written materials to support subject.  Instructor reviews cardiac drug classes: angiotensin converting enzyme inhibitors (ACE-I), angiotensin II receptor blockers (ARBs), nitrates, and calcium channel blockers.  Instructor discusses reasons, side effects, and lifestyle considerations for each drug class.   Anatomy and Physiology of the Circulatory System:  Group verbal and written instruction and models provide basic cardiac anatomy and physiology, with the coronary electrical and arterial systems. Review of: AMI, Angina, Valve disease, Heart Failure, Peripheral Artery Disease, Cardiac Arrhythmia, Pacemakers, and the ICD.   Other Education:  -Group or individual verbal, written, or video instructions that support the educational goals of the cardiac rehab program.   Holiday Eating Survival Tips:  -Group instruction provided by PowerPoint slides, verbal discussion, and written materials to support subject matter. The instructor gives patients tips, tricks, and techniques to help them not only survive but enjoy the holidays despite the onslaught of food that accompanies the holidays.   Knowledge Questionnaire Score: Knowledge Questionnaire Score - 08/21/18 1127    Knowledge Questionnaire Score          Pre Score  23/24           Core Components/Risk Factors/Patient Goals at Admission: Personal Goals and Risk Factors at Admission - 08/21/18 1127    Core Components/Risk Factors/Patient Goals on Admission           Weight Management  Yes;Obesity;Weight Maintenance;Weight Loss    Intervention  Weight Management: Develop a combined nutrition and exercise program  designed to reach desired caloric intake, while maintaining appropriate intake of nutrient and fiber, sodium and fats, and appropriate energy expenditure required for the weight goal.;Weight Management: Provide education and appropriate resources to help participant work on and attain dietary goals.;Weight Management/Obesity: Establish reasonable short term and long term weight goals.;Obesity: Provide education and appropriate resources to help participant work on and attain dietary goals.    Admit Weight  205 lb 14.6 oz (93.4 kg)    Expected Outcomes  Short Term: Continue to assess and modify interventions until short term weight is achieved;Long Term: Adherence to nutrition and physical activity/exercise program aimed toward attainment of established weight goal;Weight Maintenance: Understanding of the daily nutrition guidelines, which includes 25-35% calories from fat, 7% or less cal from saturated fats, less than 200mg  cholesterol, less than 1.5gm of sodium, & 5 or more servings of fruits and vegetables daily;Weight Loss: Understanding of general recommendations for a balanced deficit meal plan, which promotes 1-2 lb weight loss per week and includes a negative energy balance of [phone] kcal/d;Understanding recommendations for meals to include 15-35% energy as protein, 25-35% energy from fat, 35-60% energy from carbohydrates, less than 200mg  of dietary cholesterol, 20-35 gm of total fiber daily;Understanding of distribution of calorie intake throughout the day with the consumption of 4-5 meals/snacks    Diabetes  Yes    Intervention  Provide education about signs/symptoms and action to take for hypo/hyperglycemia.;Provide education about proper nutrition, including hydration, and aerobic/resistive exercise prescription along with prescribed medications to achieve blood glucose in normal ranges: Fasting glucose 65-99 mg/dL    Expected Outcomes  Short Term: Participant verbalizes understanding of the  signs/symptoms and immediate care of hyper/hypoglycemia,  proper foot care and importance of medication, aerobic/resistive exercise and nutrition plan for blood glucose control.;Long Term: Attainment of HbA1C < 7%.    Hypertension  Yes    Intervention  Provide education on lifestyle modifcations including regular physical activity/exercise, weight management, moderate sodium restriction and increased consumption of fresh fruit, vegetables, and low fat dairy, alcohol moderation, and smoking cessation.;Monitor prescription use compliance.    Expected Outcomes  Long Term: Maintenance of blood pressure at goal levels.;Short Term: Continued assessment and intervention until BP is < 140/31mm HG in hypertensive participants. < 130/23mm HG in hypertensive participants with diabetes, heart failure or chronic kidney disease.    Lipids  Yes    Intervention  Provide education and support for participant on nutrition & aerobic/resistive exercise along with prescribed medications to achieve LDL 70mg , HDL >40mg .    Expected Outcomes  Short Term: Participant states understanding of desired cholesterol values and is compliant with medications prescribed. Participant is following exercise prescription and nutrition guidelines.;Long Term: Cholesterol controlled with medications as prescribed, with individualized exercise RX and with personalized nutrition plan. Value goals: LDL < 70mg , HDL > 40 mg.    Stress  Yes    Intervention  Offer individual and/or small group education and counseling on adjustment to heart disease, stress management and health-related lifestyle change. Teach and support self-help strategies.;Refer participants experiencing significant psychosocial distress to appropriate mental health specialists for further evaluation and treatment. When possible, include family members and significant others in education/counseling sessions.    Expected Outcomes  Short Term: Participant demonstrates changes in  health-related behavior, relaxation and other stress management skills, ability to obtain effective social support, and compliance with psychotropic medications if prescribed.;Long Term: Emotional wellbeing is indicated by absence of clinically significant psychosocial distress or social isolation.           Core Components/Risk Factors/Patient Goals Review:    Core Components/Risk Factors/Patient Goals at Discharge (Final Review):    ITP Comments: ITP Comments    Row Name 08/21/18 0934   ITP Comments  Medical Director- Dr. Fransico Him, MD      Comments: Patient attended orientation from [phone] to 1015  to review rules and guidelines for program. Completed 6 minute walk test, Intitial ITP, and exercise prescription.  VSS. Telemetry-sinus rhythm, First degree AVB, neg QRS. Asymptomatic.   Andi Hence, RN, BSN Cardiac Pulmonary Rehab  08/21/18 12:07 PM

## 2018-08-21 NOTE — Progress Notes (Signed)
Cody Phelps 78 y.o. male DOB: 05/07/1941 MRN: [id number]      Nutrition Note  1. 05/30/18 AVR   2. 05/30/18 CABG x1    Past Medical History:  Diagnosis Date  . Carotid stenosis, left followed by dr [name], cardiologist--- bilateral bruit per note   per dr [name] note 93/8101  LICA 75-10% stenosis per duplex 05-14-2014  . Diverticulosis   . ED (erectile dysfunction)   . First degree heart block   . GERD (gastroesophageal reflux disease)   . Heart murmur   . Hypertension    cardiologsit-  dr [name]  . Iron deficiency anemia   . Mixed hyperlipidemia   . Morbid obesity ([place])   . Myocardial infarction ([place])    02/28/18  . OA (osteoarthritis)    right hip  . OSA on CPAP    followed by dr dohmeier, uses CPAP  . PAF (paroxysmal atrial fibrillation) ([name])    a. on Eliquis  . Prostate cancer ([place])    dx 03-17-2017 (bx)  Stage T2a, Gleason 4+4, PSA  4.43, vol 32.32--  s/p radiatctive prostate seed implants 07-10-2017 then IMRT and ADT  . RLS (restless legs syndrome)   . S/P aortic valve replacement with bioprosthetic valve 05/30/2018   23 mm Edwards Inspiris Resilia stented bovine pericardial tissue valve  . 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 S/P Maze operation for atrial fibrillation 05/30/2018   Complete bilateral atrial lesion set using bipolar radiofrequency and cryothermy ablation with clipping of LA appendage  . Scoliosis   . Severe aortic stenosis   . Trigger finger   . Type 2 diabetes mellitus ([place])   . Wears partial dentures    upper and lower   Meds reviewed.   Current Outpatient Medications (Endocrine & Metabolic):  .  insulin glargine (LANTUS SOLOSTAR) 100 UNIT/ML injection, Inject 20-44 Units into the skin at bedtime as needed (for BGL >150).   Current Outpatient Medications (Cardiovascular):  .  amiodarone (PACERONE) 200 MG tablet, Take 1 tablet (200 mg total) by mouth daily. Marland Kitchen  atorvastatin (LIPITOR) 80 MG tablet, Take 1 tablet (80 mg total) by  mouth daily at 6 PM. (Patient taking differently: Take 80 mg by mouth every evening. ) .  colesevelam (WELCHOL) 625 MG tablet, Take 625 mg by mouth 3 (three) times daily.  .  metoprolol tartrate (LOPRESSOR) 25 MG tablet, Take 0.5 tablets (12.5 mg total) by mouth 2 (two) times daily. Marland Kitchen  omega-3 acid ethyl esters (LOVAZA) 1 G capsule, Take 2 g by mouth daily.  Marland Kitchen  spironolactone (ALDACTONE) 25 MG tablet, Take 0.5 tablets (12.5 mg total) by mouth daily. Marland Kitchen  torsemide (DEMADEX) 20 MG tablet, Take 1 tablet (20 mg total) by mouth daily. .  valsartan-hydrochlorothiazide (DIOVAN-HCT) 80-12.5 MG tablet, Take 1 tablet by mouth daily.   Current Outpatient Medications (Analgesics):  .  acetaminophen (TYLENOL) 325 MG tablet, Take 1 tablet (325 mg total) by mouth every 4 (four) hours as needed (headache, pain).  Current Outpatient Medications (Hematological):  .  ferrous sulfate 325 (65 FE) MG tablet, Take 1 tablet (325 mg total) by mouth 3 (three) times daily with meals. (Patient taking differently: Take 325 mg by mouth daily with breakfast. ) .  Rivaroxaban (XARELTO) 15 MG TABS tablet, Take 15 mg by mouth daily.   Current Outpatient Medications (Other):  .  donepezil (ARICEPT) 10 MG tablet, Take 10 mg by mouth every morning.  Marland Kitchen  esomeprazole (NEXIUM) 40  MG capsule, Take 40 mg by mouth daily.  .  memantine (NAMENDA) 5 MG tablet, Take 5 mg by mouth 2 (two) times daily. .  mirabegron ER (MYRBETRIQ) 50 MG TB24 tablet, Take 50 mg by mouth every evening.  .  potassium chloride SA (K-DUR,KLOR-CON) 20 MEQ tablet, Take 1 tablet (20 mEq total) by mouth daily with breakfast. .  rOPINIRole (REQUIP) 2 MG tablet, Take 2 mg by mouth 2 (two) times daily. 1600 & at bedtime .  tamsulosin (FLOMAX) 0.4 MG CAPS capsule, Take 0.4 mg by mouth daily after supper.    HT: Ht Readings from Last 1 Encounters:  08/21/18 5' 6.75" (1.695 m)    WT: Wt Readings from Last 5 Encounters:  08/21/18 205 lb 14.6 oz (93.4 kg)  08/20/18  198 lb (89.8 kg)  08/10/18 197 lb (89.4 kg)  06/26/18 194 lb (88 kg)  06/04/18 202 lb 9.6 oz (91.9 kg)     Body mass index is 32.49 kg/m.   Current tobacco use? No  Labs:  Lipid Panel     Component Value Date/Time   CHOL 151 03/01/2018 0514   TRIG 69 03/01/2018 0514   HDL 55 03/01/2018 0514   CHOLHDL 2.7 03/01/2018 0514   VLDL 14 03/01/2018 0514   LDLCALC 82 03/01/2018 0514    Lab Results  Component Value Date   HGBA1C 5.8 (H) 05/28/2018   CBG (last 3)  No results for input(s): GLUCAP in the last 72 hours.  Nutrition Note Spoke with pt. Nutrition plan and goals reviewed with pt. Pt is following Step 2 of the Therapeutic Lifestyle Changes diet. Pt wants to lose wt. Pt has not been trying to lose wt. Wt loss tips reviewed (label reading, how to build a healthy plate, portion sizes, eating frequently across the day). Set goal with patient to build a healthy plate, focus on portion size, food choices, and to weight and measure portions for accuracy. Pt has Type 2 Diabetes. Last A1c indicates blood glucose well-controlled. This Probation officer went over Diabetes Education test results. Pt checks CBG's 1 times a day. Fasting CBG's in the evening reportedly 110-150's mg/dL. Per discussion, pt does not use canned/convenience foods often. Pt does not add salt to food. Pt does not eat out frequently. Pt expressed understanding of the information reviewed. Pt aware of nutrition education classes offered and would like to attend nutrition classes.  Nutrition Diagnosis ? Food-and nutrition-related knowledge deficit related to lack of exposure to information as related to diagnosis of: ? CVD ? Type 2 Diabetes ? Obese  I = 30-34.9 related to excessive energy intake as evidenced by a Body mass index is 32.49 kg/m.  Nutrition Intervention ? Pt's individual nutrition plan and goals reviewed with pt. ? Pt given handouts for: ? Nutrition I class ? Nutrition II class  ? Diabetes Blitz Class   Nutrition  Goal(s):  ? Pt to identify and limit food sources of saturated fat, trans fat, refined carbohydrates and sodium ? Pt to identify food quantities necessary to achieve weight loss of 6-24 lbs. at graduation from cardiac rehab   Plan:  ? Pt to attend nutrition classes ? Nutrition I ? Nutrition II ? Portion Distortion  ? Diabetes Blitz ? Diabetes Q & Ae determined ? Will provide client-centered nutrition education as part of interdisciplinary care ? Monitor and evaluate progress toward nutrition goal with team.   Laurina Bustle, MS, RD, LDN 08/21/2018 3:52 PM

## 2018-08-22 ENCOUNTER — Encounter: Payer: Self-pay | Admitting: Cardiology

## 2018-08-29 ENCOUNTER — Encounter (HOSPITAL_COMMUNITY)
Admission: RE | Admit: 2018-08-29 | Discharge: 2018-08-29 | Disposition: A | Discharge status: Home / Self Care | Payer: Medicare Other | Source: Ambulatory Visit | Attending: Cardiology | Admitting: Cardiology

## 2018-08-29 ENCOUNTER — Ambulatory Visit (HOSPITAL_COMMUNITY): Payer: Medicare Other

## 2018-08-29 ENCOUNTER — Encounter (HOSPITAL_COMMUNITY): Payer: Self-pay

## 2018-08-29 DIAGNOSIS — Z951 Presence of aortocoronary bypass graft: Secondary | ICD-10-CM

## 2018-08-29 DIAGNOSIS — Z952 Presence of prosthetic heart valve: Secondary | ICD-10-CM | POA: Diagnosis not present

## 2018-08-29 LAB — GLUCOSE, CAPILLARY
Glucose-Capillary: 104 mg/dL — ABNORMAL HIGH (ref 70–99)
Glucose-Capillary: 162 mg/dL — ABNORMAL HIGH (ref 70–99)

## 2018-08-29 NOTE — Progress Notes (Signed)
Daily Session Note  Patient Details  Name: Cody Phelps MRN: [id number] Date of Birth: 06-08-1941 Referring Provider:   Flowsheet Row CARDIAC REHAB PHASE II ORIENTATION from 08/21/2018 in [facility]  Referring Provider  Dr. [name]      Encounter Date: 08/29/2018  Check In: Session Check In - 08/29/18 1436    Check-In          Supervising physician immediately available to respond to emergencies  [facility] immediately available    Physician(s)  Dr Waldron Labs    Location  [facility]    Staff Present  Barnet Pall, RN, BSN    Medication changes reported      No    Fall or balance concerns reported     No    Tobacco Cessation  No Change    Warm-up and Cool-down  Performed as group-led instruction    Resistance Training Performed  No    VAD Patient?  No    PAD/SET Patient?  No        Pain Assessment          Currently in Pain?  No/denies           Capillary Blood Glucose: Results for orders placed or performed during the hospital encounter of 08/29/18 (from the past 24 hour(s))  Glucose, capillary     Status: Abnormal   Collection Time: 08/29/18  2:29 PM  Result Value Ref Range   Glucose-Capillary 162 (H) 70 - 99 mg/dL    Exercise Prescription Changes - 08/29/18 1500    Response to Exercise          Blood Pressure (Admit)  118/60    Blood Pressure (Exercise)  160/78    Blood Pressure (Exit)  112/58    Heart Rate (Admit)  71 bpm    Heart Rate (Exercise)  107 bpm    Heart Rate (Exit)  68 bpm    Rating of Perceived Exertion (Exercise)  13    Comments  t first day of exercise.     Duration  Progress to 30 minutes of  aerobic without signs/symptoms of physical distress    Intensity  THRR unchanged        Progression          Progression  Continue to progress workloads to maintain intensity without signs/symptoms of physical distress.    Average METs  2.3        Resistance Training          Training  Prescription  No        NuStep          Level  2    SPM  75    Minutes  10    METs  2        Track          Laps  13    Minutes  10    METs  2.69           Social History   Tobacco Use  Smoking Status Former Smoker  . Years: 1.00  . Types: Cigarettes  . Last attempt to quit: 02/04/1967  . Years since quitting: 51.6  Smokeless Tobacco Never Used  Tobacco Comment   smoked 1 q2-3 days    Goals Met:  Exercise tolerated well  Goals Unmet:  Not Applicable  Comments: Pt started cardiac rehab today.  Pt tolerated light exercise without difficulty. VSS, telemetry-sinus rhythm, asymptomatic.  Medication list reconciled. Pt denies barriers to medicaiton compliance.  PSYCHOSOCIAL ASSESSMENT:  PHQ-0. Pt exhibits positive coping skills, hopeful outlook with supportive family. No psychosocial needs identified at this time, no psychosocial interventions necessary.   Pt oriented to exercise equipment and routine.    Understanding verbalized.  Andi Hence, RN, BSN Cardiac Pulmonary Rehab 08/29/18 3:38 PM    Dr. Fransico Him is Medical Director for Cardiac Rehab at [facility].

## 2018-08-29 NOTE — Progress Notes (Signed)
CATALINO PLASCENCIA 78 y.o. male DOB: 26-Jul-1941 MRN: [id number]      Nutrition Note  1. 05/30/18 AVR   2. 05/30/18 CABG x1    Past Medical History:  Diagnosis Date  . Carotid stenosis, left followed by dr [name], cardiologist--- bilateral bruit per note   per dr [name] note 51/0258  LICA 52-77% stenosis per duplex 05-14-2014  . Diverticulosis   . ED (erectile dysfunction)   . First degree heart block   . GERD (gastroesophageal reflux disease)   . Heart murmur   . Hypertension    cardiologsit-  dr [name]  . Iron deficiency anemia   . Mixed hyperlipidemia   . Morbid obesity ([place])   . Myocardial infarction ([place])    02/28/18  . OA (osteoarthritis)    right hip  . OSA on CPAP    followed by dr dohmeier, uses CPAP  . PAF (paroxysmal atrial fibrillation) ([name])    a. on Eliquis  . Prostate cancer ([place])    dx 03-17-2017 (bx)  Stage T2a, Gleason 4+4, PSA  4.43, vol 32.32--  s/p radiatctive prostate seed implants 07-10-2017 then IMRT and ADT  . RLS (restless legs syndrome)   . S/P aortic valve replacement with bioprosthetic valve 05/30/2018   23 mm Edwards Inspiris Resilia stented bovine pericardial tissue valve  . 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 S/P Maze operation for atrial fibrillation 05/30/2018   Complete bilateral atrial lesion set using bipolar radiofrequency and cryothermy ablation with clipping of LA appendage  . Scoliosis   . Severe aortic stenosis   . Trigger finger   . Type 2 diabetes mellitus ([place])   . Wears partial dentures    upper and lower   Meds reviewed.   Current Outpatient Medications (Endocrine & Metabolic):  .  insulin glargine (LANTUS SOLOSTAR) 100 UNIT/ML injection, Inject 20-44 Units into the skin at bedtime as needed (for BGL >150).   Current Outpatient Medications (Cardiovascular):  .  amiodarone (PACERONE) 200 MG tablet, Take 1 tablet (200 mg total) by mouth daily. Marland Kitchen  atorvastatin (LIPITOR) 80 MG tablet, Take 1 tablet (80 mg total) by  mouth daily at 6 PM. (Patient taking differently: Take 80 mg by mouth every evening. ) .  colesevelam (WELCHOL) 625 MG tablet, Take 625 mg by mouth 3 (three) times daily.  .  metoprolol tartrate (LOPRESSOR) 25 MG tablet, Take 0.5 tablets (12.5 mg total) by mouth 2 (two) times daily. Marland Kitchen  omega-3 acid ethyl esters (LOVAZA) 1 G capsule, Take 2 g by mouth daily.  Marland Kitchen  spironolactone (ALDACTONE) 25 MG tablet, Take 0.5 tablets (12.5 mg total) by mouth daily. Marland Kitchen  torsemide (DEMADEX) 20 MG tablet, Take 1 tablet (20 mg total) by mouth daily. .  valsartan-hydrochlorothiazide (DIOVAN-HCT) 80-12.5 MG tablet, Take 1 tablet by mouth daily.   Current Outpatient Medications (Analgesics):  .  acetaminophen (TYLENOL) 325 MG tablet, Take 1 tablet (325 mg total) by mouth every 4 (four) hours as needed (headache, pain).  Current Outpatient Medications (Hematological):  .  ferrous sulfate 325 (65 FE) MG tablet, Take 1 tablet (325 mg total) by mouth 3 (three) times daily with meals. (Patient taking differently: Take 325 mg by mouth daily with breakfast. ) .  Rivaroxaban (XARELTO) 15 MG TABS tablet, Take 15 mg by mouth daily.   Current Outpatient Medications (Other):  .  donepezil (ARICEPT) 10 MG tablet, Take 10 mg by mouth every morning.  Marland Kitchen  esomeprazole (NEXIUM) 40  MG capsule, Take 40 mg by mouth daily.  .  memantine (NAMENDA) 5 MG tablet, Take 5 mg by mouth 2 (two) times daily. .  mirabegron ER (MYRBETRIQ) 50 MG TB24 tablet, Take 50 mg by mouth every evening.  .  potassium chloride SA (K-DUR,KLOR-CON) 20 MEQ tablet, Take 1 tablet (20 mEq total) by mouth daily with breakfast. .  rOPINIRole (REQUIP) 2 MG tablet, Take 2 mg by mouth 2 (two) times daily. 1600 & at bedtime .  tamsulosin (FLOMAX) 0.4 MG CAPS capsule, Take 0.4 mg by mouth daily after supper.    HT: Ht Readings from Last 1 Encounters:  08/21/18 5' 6.75" (1.695 m)    WT: Wt Readings from Last 5 Encounters:  08/21/18 205 lb 14.6 oz (93.4 kg)  08/20/18  198 lb (89.8 kg)  08/10/18 197 lb (89.4 kg)  06/26/18 194 lb (88 kg)  06/04/18 202 lb 9.6 oz (91.9 kg)     Body mass index is 32.49 kg/m.   Current tobacco use? No  Labs:  Lipid Panel     Component Value Date/Time   CHOL 151 03/01/2018 0514   TRIG 69 03/01/2018 0514   HDL 55 03/01/2018 0514   CHOLHDL 2.7 03/01/2018 0514   VLDL 14 03/01/2018 0514   LDLCALC 82 03/01/2018 0514    Lab Results  Component Value Date   HGBA1C 5.8 (H) 05/28/2018   CBG (last 3)  No results for input(s): GLUCAP in the last 72 hours.  Nutrition Note Spoke with pt. Nutrition plan and goals reviewed with pt. Pt is following Step 2 of the Therapeutic Lifestyle Changes diet. Pt wants to lose wt. Pt has been trying to cut back on simple carbs, and portion sizes. Heart healthy diabetic weight loss tips discussed in orientation reviewed (label reading, how to build a healthy plate, portion sizes, weighing and measuring foods, eating frequently across the day). Set goal with patient to build a healthy plate, focus on portion size, food choices, and to weight and measure portions for accuracy. Pt has Type 2 Diabetes. Last A1c indicates blood glucose well-controlled. This Probation officer went over Diabetes Education test results. Pt checks CBG's 1 times a day. Fasting CBG's in the evening reportedly 110-150's mg/dL. Per discussion, pt does not use canned/convenience foods often. Pt does not add salt to food. Pt does not eat out frequently. Pt expressed understanding of the information reviewed. Pt aware of nutrition education classes offered and would like to attend nutrition classes.  Nutrition Diagnosis ? Food-and nutrition-related knowledge deficit related to lack of exposure to information as related to diagnosis of: ? CVD ? Type 2 Diabetes ? Obese  I = 30-34.9 related to excessive energy intake as evidenced by a Body mass index is 32.49 kg/m.  Nutrition Intervention ? Pt's individual nutrition plan and goals reviewed  with pt. ? Pt given handouts for: ? Nutrition I class ? Nutrition II class  ? Diabetes Blitz Class   Nutrition Goal(s):  ? Pt to identify and limit food sources of saturated fat, trans fat, refined carbohydrates and sodium ? Pt to identify food quantities necessary to achieve weight loss of 6-24 lbs. at graduation from cardiac rehab  Plan:  ? Pt to attend nutrition classes ? Nutrition I ? Nutrition II ? Portion Distortion  ? Diabetes Blitz ? Diabetes Q & Ae determined ? Will provide client-centered nutrition education as part of interdisciplinary care ? Monitor and evaluate progress toward nutrition goal with team.   Laurina Bustle, MS, RD,  LDN 08/29/2018 2:22 PM

## 2018-08-31 ENCOUNTER — Encounter (HOSPITAL_COMMUNITY)
Admission: RE | Admit: 2018-08-31 | Discharge: 2018-08-31 | Disposition: A | Discharge status: Home / Self Care | Payer: Medicare Other | Source: Ambulatory Visit | Attending: Cardiology | Admitting: Cardiology

## 2018-08-31 ENCOUNTER — Ambulatory Visit (HOSPITAL_COMMUNITY): Payer: Medicare Other

## 2018-08-31 DIAGNOSIS — Z952 Presence of prosthetic heart valve: Secondary | ICD-10-CM | POA: Diagnosis not present

## 2018-08-31 DIAGNOSIS — Z951 Presence of aortocoronary bypass graft: Secondary | ICD-10-CM

## 2018-08-31 LAB — GLUCOSE, CAPILLARY
Glucose-Capillary: 133 mg/dL — ABNORMAL HIGH (ref 70–99)
Glucose-Capillary: 115 mg/dL — ABNORMAL HIGH (ref 70–99)

## 2018-09-03 ENCOUNTER — Ambulatory Visit (HOSPITAL_COMMUNITY): Payer: Medicare Other

## 2018-09-03 ENCOUNTER — Encounter (HOSPITAL_COMMUNITY)
Admission: RE | Admit: 2018-09-03 | Discharge: 2018-09-03 | Disposition: A | Discharge status: Home / Self Care | Payer: Medicare Other | Source: Ambulatory Visit | Attending: Cardiology | Admitting: Cardiology

## 2018-09-03 DIAGNOSIS — Z951 Presence of aortocoronary bypass graft: Secondary | ICD-10-CM | POA: Insufficient documentation

## 2018-09-03 DIAGNOSIS — Z952 Presence of prosthetic heart valve: Secondary | ICD-10-CM | POA: Insufficient documentation

## 2018-09-03 LAB — GLUCOSE, CAPILLARY: Glucose-Capillary: 104 mg/dL — ABNORMAL HIGH (ref 70–99)

## 2018-09-05 ENCOUNTER — Encounter (HOSPITAL_COMMUNITY)
Admission: RE | Admit: 2018-09-05 | Discharge: 2018-09-05 | Disposition: A | Discharge status: Home / Self Care | Payer: Medicare Other | Source: Ambulatory Visit | Attending: Cardiology | Admitting: Cardiology

## 2018-09-05 ENCOUNTER — Ambulatory Visit (HOSPITAL_COMMUNITY): Payer: Medicare Other

## 2018-09-05 DIAGNOSIS — Z952 Presence of prosthetic heart valve: Secondary | ICD-10-CM

## 2018-09-05 DIAGNOSIS — Z951 Presence of aortocoronary bypass graft: Secondary | ICD-10-CM

## 2018-09-05 LAB — GLUCOSE, CAPILLARY
Glucose-Capillary: 107 mg/dL — ABNORMAL HIGH (ref 70–99)
Glucose-Capillary: 117 mg/dL — ABNORMAL HIGH (ref 70–99)

## 2018-09-06 ENCOUNTER — Other Ambulatory Visit (HOSPITAL_COMMUNITY): Payer: Self-pay | Admitting: Cardiology

## 2018-09-06 NOTE — Progress Notes (Signed)
Cardiac Individual Treatment Plan  Patient Details  Name: Cody Phelps MRN: [id number] Date of Birth: 04/14/1941 Referring Provider:   Flowsheet Row CARDIAC REHAB PHASE II ORIENTATION from 08/21/2018 in [place]  Referring Provider  Dr. [name]      Initial Encounter Date:  Flowsheet Row CARDIAC REHAB PHASE II ORIENTATION from 08/21/2018 in [place]  Date  08/21/18      Visit Diagnosis: 05/30/18 AVR  05/30/18 CABG x1  Patient's Home Medications on Admission:  Current Outpatient Medications:  .  acetaminophen (TYLENOL) 325 MG tablet, Take 1 tablet (325 mg total) by mouth every 4 (four) hours as needed (headache, pain)., Disp: 60 tablet, Rfl: 3 .  amiodarone (PACERONE) 200 MG tablet, Take 1 tablet (200 mg total) by mouth daily., Disp: 30 tablet, Rfl: 1 .  atorvastatin (LIPITOR) 80 MG tablet, Take 1 tablet (80 mg total) by mouth daily at 6 PM. (Patient taking differently: Take 80 mg by mouth every evening. ), Disp: 60 tablet, Rfl: 3 .  colesevelam (WELCHOL) 625 MG tablet, Take 625 mg by mouth 3 (three) times daily. , Disp: , Rfl:  .  donepezil (ARICEPT) 10 MG tablet, Take 10 mg by mouth every morning. , Disp: , Rfl:  .  esomeprazole (NEXIUM) 40 MG capsule, Take 40 mg by mouth daily. , Disp: , Rfl:  .  ferrous sulfate 325 (65 FE) MG tablet, Take 1 tablet (325 mg total) by mouth 3 (three) times daily with meals. (Patient taking differently: Take 325 mg by mouth daily with breakfast. ), Disp: 60 tablet, Rfl: 3 .  insulin glargine (LANTUS SOLOSTAR) 100 UNIT/ML injection, Inject 20-44 Units into the skin at bedtime as needed (for BGL >150). , Disp: , Rfl:  .  memantine (NAMENDA) 5 MG tablet, Take 5 mg by mouth 2 (two) times daily., Disp: , Rfl:  .  metoprolol tartrate (LOPRESSOR) 25 MG tablet, Take 0.5 tablets (12.5 mg total) by mouth 2 (two) times daily., Disp: 60 tablet, Rfl: 3 .  mirabegron ER (MYRBETRIQ) 50 MG TB24 tablet,  Take 50 mg by mouth every evening. , Disp: , Rfl:  .  omega-3 acid ethyl esters (LOVAZA) 1 G capsule, Take 2 g by mouth daily. , Disp: , Rfl:  .  potassium chloride SA (K-DUR,KLOR-CON) 20 MEQ tablet, Take 1 tablet (20 mEq total) by mouth daily with breakfast., Disp: 30 tablet, Rfl: 1 .  Rivaroxaban (XARELTO) 15 MG TABS tablet, Take 15 mg by mouth daily. , Disp: , Rfl:  .  rOPINIRole (REQUIP) 2 MG tablet, Take 2 mg by mouth 2 (two) times daily. 1600 & at bedtime, Disp: , Rfl:  .  spironolactone (ALDACTONE) 25 MG tablet, Take 0.5 tablets (12.5 mg total) by mouth daily., Disp: 30 tablet, Rfl: 3 .  tamsulosin (FLOMAX) 0.4 MG CAPS capsule, Take 0.4 mg by mouth daily after supper. , Disp: , Rfl:  .  torsemide (DEMADEX) 20 MG tablet, Take 1 tablet (20 mg total) by mouth daily., Disp: 30 tablet, Rfl: 1 .  valsartan-hydrochlorothiazide (DIOVAN-HCT) 80-12.5 MG tablet, Take 1 tablet by mouth daily., Disp: , Rfl:   Past Medical History: Past Medical History:  Diagnosis Date  . Carotid stenosis, left followed by dr [name], cardiologist--- bilateral bruit per note   per dr [name] note [id number]  LICA 56-38% stenosis per duplex 05-14-2014  . Diverticulosis   . ED (erectile dysfunction)   . First degree heart block   . GERD (  gastroesophageal reflux disease)   . Heart murmur   . Hypertension    cardiologsit-  dr [name]  . Iron deficiency anemia   . Mixed hyperlipidemia   . Morbid obesity ([place])   . Myocardial infarction ([place])    02/28/18  . OA (osteoarthritis)    right hip  . OSA on CPAP    followed by dr dohmeier, uses CPAP  . PAF (paroxysmal atrial fibrillation) ([name])    a. on Eliquis  . Prostate cancer ([place])    dx 03-17-2017 (bx)  Stage T2a, Gleason 4+4, PSA  4.43, vol 32.32--  s/p radiatctive prostate seed implants 07-10-2017 then IMRT and ADT  . RLS (restless legs syndrome)   . 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 S/P Maze operation for atrial fibrillation 05/30/2018   Complete bilateral atrial lesion set using bipolar radiofrequency and cryothermy ablation with clipping of LA appendage  . Scoliosis   . Severe aortic stenosis   . Trigger finger   . Type 2 diabetes mellitus ([place])   . Wears partial dentures    upper and lower    Tobacco Use: Social History   Tobacco Use  Smoking Status Former Smoker  . Years: 1.00  . Types: Cigarettes  . Last attempt to quit: 02/04/1967  . Years since quitting: 51.6  Smokeless Tobacco Never Used  Tobacco Comment   smoked 1 q2-3 days    Labs: Recent Review Flowsheet Data    Labs for ITP Cardiac and Pulmonary Rehab Latest Ref Rng & Units 05/30/2018 05/30/2018 05/30/2018 05/31/2018 06/01/2018   Cholestrol 0 - 200 mg/dL - - - - -   LDLCALC 0 - 99 mg/dL - - - - -   HDL >40 mg/dL - - - - -   Trlycerides <150 mg/dL - - - - -   Hemoglobin A1c 4.8 - 5.6 % - - - - -   PHART 7.350 - 7.450 7.336(L) 7.325(L) - - -   PCO2ART 32.0 - 48.0 mmHg 42.8 42.7 - - -   HCO3 20.0 - 28.0 mmol/L 22.7 22.1 - - -   TCO2 22 - 32 mmol/L _0 - -   ACIDBASEDEF 0.0 - 2.0 mmol/L 3.0(H) 4.0(H) - - -   O2SAT % 99.0 97.0 - 55.7 75.4      Capillary Blood Glucose: Lab Results  Component Value Date   GLUCAP 107 (H) 09/05/2018   GLUCAP 117 (H) 09/05/2018   GLUCAP 104 (H) 09/03/2018   GLUCAP 115 (H) 08/31/2018   GLUCAP 133 (H) 08/31/2018     Exercise Target Goals: Exercise Program Goal: Individual exercise prescription set using results from initial 6 min walk test and THRR while considering  patient's activity barriers and safety.   Exercise Prescription Goal: Initial exercise prescription builds to 30-45 minutes a day of aerobic activity, 2-3 days per week.  Home exercise guidelines will be given to patient during program as part of exercise prescription that the participant will  acknowledge.  Activity Barriers & Risk Stratification: Activity Barriers & Cardiac Risk Stratification - 08/21/18 0937    Activity Barriers & Cardiac Risk Stratification          Activity Barriers  Right Hip Replacement;Other (comment)    Comments  Previous back and ankle surgery    Cardiac Risk Stratification  High  6 Minute Walk: 6 Minute Walk    6 Minute Walk    Row Name 08/21/18 1122   Phase  Initial   Distance  1069 feet   Walk Time  6 minutes   # of Rest Breaks  0   MPH  2.02   METS  2.02   RPE  13   Symptoms  No   Resting HR  85 bpm   Resting BP  122/64   Resting Oxygen Saturation   99 %   Exercise Oxygen Saturation  during 6 min walk  100 %   Max Ex. HR  110 bpm   Max Ex. BP  130/74   2 Minute Post BP  130/70          Oxygen Initial Assessment:   Oxygen Re-Evaluation:   Oxygen Discharge (Final Oxygen Re-Evaluation):   Initial Exercise Prescription: Initial Exercise Prescription - 08/21/18 1100    Date of Initial Exercise RX and Referring Provider          Date  08/21/18    Referring Provider  Dr. [name]    Expected Discharge Date  11/26/18        NuStep          Level  2    SPM  75    Minutes  10    METs  2        Arm Ergometer          Level  1    Watts  30    Minutes  10    METs  2.75        Track          Laps  7    Minutes  10    METs  2        Prescription Details          Frequency (times per week)  3    Duration  Progress to 30 minutes of continuous aerobic without signs/symptoms of physical distress        Intensity          THRR 40-80% of Max Heartrate  58-116    Ratings of Perceived Exertion  11-13        Progression          Progression  Continue to progress workloads to maintain intensity without signs/symptoms of physical distress.        Resistance Training          Training Prescription  Yes    Weight  2 lbs.     Reps  10-15           Perform Capillary Blood Glucose checks as  needed.  Exercise Prescription Changes: Exercise Prescription Changes    Response to Exercise    Row Name 08/29/18 1500 09/03/18 1500   Blood Pressure (Admit)  118/60  120/60   Blood Pressure (Exercise)  160/78  128/62   Blood Pressure (Exit)  112/58  122/60   Heart Rate (Admit)  71 bpm  96 bpm   Heart Rate (Exercise)  107 bpm  104 bpm   Heart Rate (Exit)  68 bpm  96 bpm   Rating of Perceived Exertion (Exercise)  13  13   Symptoms  no documentation  None   Comments  t first day of exercise.   no documentation   Duration  Progress to 30 minutes of  aerobic without signs/symptoms of physical distress  Progress to 30 minutes of  aerobic without signs/symptoms of physical distress   Intensity  THRR unchanged  THRR unchanged       Progression    Row Name 08/29/18 1500 09/03/18 1500   Progression  Continue to progress workloads to maintain intensity without signs/symptoms of physical distress.  Continue to progress workloads to maintain intensity without signs/symptoms of physical distress.   Average METs  2.3  2       Resistance Training    Row Name 08/29/18 1500 09/03/18 1500   Training Prescription  No  Yes   Weight  no documentation  4 lbs.   Reps  no documentation  10-15   Time  no documentation  10 Minutes       Interval Training    Row Name 08/29/18 1500 09/03/18 1500   Interval Training  no documentation  No       NuStep    Row Name 08/29/18 1500 09/03/18 1500   Level  2  2   SPM  75  75   Minutes  10  10   METs  2  1.8       Arm Ergometer    Row Name 08/29/18 1500 09/03/18 1500   Level  no documentation  1   Watts  no documentation  10   Minutes  no documentation  10   METs  no documentation  2.75       Track    Row Name 08/29/18 1500 09/03/18 1500   Laps  13  9   Minutes  10  10   METs  2.69  2.22          Exercise Comments: Exercise Comments    Row Name 08/29/18 1525 09/06/18 1044   Exercise Comments  Pt first day of cardiac rehab. Tolerated  exercise well.   Reviewed METs and goals with Pt. Pt is tolerating exercise well.       Exercise Goals and Review: Exercise Goals    Exercise Goals    Row Name 08/21/18 0940   Increase Physical Activity  Yes   Intervention  Provide advice, education, support and counseling about physical activity/exercise needs.;Develop an individualized exercise prescription for aerobic and resistive training based on initial evaluation findings, risk stratification, comorbidities and participant's personal goals.   Expected Outcomes  Short Term: Attend rehab on a regular basis to increase amount of physical activity.;Long Term: Add in home exercise to make exercise part of routine and to increase amount of physical activity.;Long Term: Exercising regularly at least 3-5 days a week.   Increase Strength and Stamina  Yes   Intervention  Provide advice, education, support and counseling about physical activity/exercise needs.;Develop an individualized exercise prescription for aerobic and resistive training based on initial evaluation findings, risk stratification, comorbidities and participant's personal goals.   Expected Outcomes  Short Term: Increase workloads from initial exercise prescription for resistance, speed, and METs.;Short Term: Perform resistance training exercises routinely during rehab and add in resistance training at home;Long Term: Improve cardiorespiratory fitness, muscular endurance and strength as measured by increased METs and functional capacity (6MWT)   Able to understand and use rate of perceived exertion (RPE) scale  Yes   Intervention  Provide education and explanation on how to use RPE scale   Expected Outcomes  Short Term: Able to use RPE daily in rehab to express subjective intensity level;Long Term:  Able to use RPE to guide intensity level when exercising independently   Knowledge and understanding of Target Heart Rate  Range (THRR)  Yes   Intervention  Provide education and  explanation of THRR including how the numbers were predicted and where they are located for reference   Expected Outcomes  Short Term: Able to state/look up THRR;Long Term: Able to use THRR to govern intensity when exercising independently;Short Term: Able to use daily as guideline for intensity in rehab   Able to check pulse independently  Yes   Intervention  Provide education and demonstration on how to check pulse in carotid and radial arteries.;Review the importance of being able to check your own pulse for safety during independent exercise   Expected Outcomes  Short Term: Able to explain why pulse checking is important during independent exercise;Long Term: Able to check pulse independently and accurately   Understanding of Exercise Prescription  Yes   Intervention  Provide education, explanation, and written materials on patient's individual exercise prescription   Expected Outcomes  Short Term: Able to explain program exercise prescription;Long Term: Able to explain home exercise prescription to exercise independently          Exercise Goals Re-Evaluation : Exercise Goals Re-Evaluation    Exercise Goal Re-Evaluation    Row Name 08/29/18 1524 09/06/18 1042   Exercise Goals Review  Increase Physical Activity;Increase Strength and Stamina;Understanding of Exercise Prescription;Knowledge and understanding of Target Heart Rate Range (THRR);Able to understand and use rate of perceived exertion (RPE) scale  Increase Physical Activity;Increase Strength and Stamina;Able to understand and use rate of perceived exertion (RPE) scale;Knowledge and understanding of Target Heart Rate Range (THRR);Understanding of Exercise Prescription;Able to check pulse independently   Comments  Pt first day of the program. Pt tolerated exercise well. Understood RPE scale, THRR, and prescription.   Reviewed METs and goals with Pt. Pt has a MET level of 2.0. Pt is not currently exercising at home. Encouraged Pt to walk  2-3 days per week for 30 minutes in addition to the program.    Expected Outcomes  Will continue to monitor and progress Pt as tolerated.   Will continue to monitor and progress Pt as tolerated.           Discharge Exercise Prescription (Final Exercise Prescription Changes): Exercise Prescription Changes - 09/03/18 1500    Response to Exercise          Blood Pressure (Admit)  120/60    Blood Pressure (Exercise)  128/62    Blood Pressure (Exit)  122/60    Heart Rate (Admit)  96 bpm    Heart Rate (Exercise)  104 bpm    Heart Rate (Exit)  96 bpm    Rating of Perceived Exertion (Exercise)  13    Symptoms  None    Duration  Progress to 30 minutes of  aerobic without signs/symptoms of physical distress    Intensity  THRR unchanged        Progression          Progression  Continue to progress workloads to maintain intensity without signs/symptoms of physical distress.    Average METs  2        Resistance Training          Training Prescription  Yes    Weight  4 lbs.    Reps  10-15    Time  10 Minutes        Interval Training          Interval Training  No        NuStep  Level  2    SPM  75    Minutes  10    METs  1.8        Arm Ergometer          Level  1    Watts  10    Minutes  10    METs  2.75        Track          Laps  9    Minutes  10    METs  2.22           Nutrition:  Target Goals: Understanding of nutrition guidelines, daily intake of sodium <1559m, cholesterol <2015m calories 30% from fat and 7% or less from saturated fats, daily to have 5 or more servings of fruits and vegetables.  Biometrics: Pre Biometrics - 08/21/18 1123    Pre Biometrics          Height  5' 6.75" (1.695 m)    Weight  93.4 kg    Waist Circumference  43.25 inches    Hip Circumference  42.5 inches    Waist to Hip Ratio  1.02 %    BMI (Calculated)  32.51    Triceps Skinfold  21 mm    % Body Fat  32.7 %    Grip Strength  18.5 kg    Flexibility  0 in     Single Leg Stand  0.56 seconds            Nutrition Therapy Plan and Nutrition Goals: Nutrition Therapy & Goals - 08/21/18 1557    Nutrition Therapy          Diet  Heart healthy, carb modified        Personal Nutrition Goals          Nutrition Goal  Pt to identify and limit food sources of saturated fat, trans fat, refined carbohydrates and sodium    Personal Goal #2  Pt to identify food quantities necessary to achieve weight loss of 6-24 lbs. at graduation from cardiac rehab        [place] regarding individualized specific dietary modifications aiming towards targeted core components such as weight, hypertension, lipid management, diabetes, heart failure and other comorbidities.    Expected Outcomes  Short Term Goal: Understand basic principles of dietary content, such as calories, fat, sodium, cholesterol and nutrients.;Long Term Goal: Adherence to prescribed nutrition plan.           Nutrition Assessments: Nutrition Assessments - 08/21/18 1559    Rate Your Plate Scores          Pre Score  26           Nutrition Goals Re-Evaluation: Nutrition Goals Re-Evaluation    Goals    Row Name 08/21/18 1557   Current Weight  205 lb 14.6 oz (93.4 kg)          Nutrition Goals Re-Evaluation: Nutrition Goals Re-Evaluation    Goals    Row Name 08/21/18 1557   Current Weight  205 lb 14.6 oz (93.4 kg)          Nutrition Goals Discharge (Final Nutrition Goals Re-Evaluation): Nutrition Goals Re-Evaluation - 08/21/18 1557    Goals          Current Weight  205 lb 14.6 oz (93.4 kg)           Psychosocial: Target Goals:  Acknowledge presence or absence of significant depression and/or stress, maximize coping skills, provide positive support system. Participant is able to verbalize types and ability to use techniques and skills needed for reducing stress and depression.  Initial Review & Psychosocial  Screening: Initial Psych Review & Screening - 08/21/18 1200    Initial Review          Current issues with  None Identified;Current Stress Concerns    Source of Stress Concerns  Chronic Illness        Family Dynamics          Good Support System?  Yes   spouse, family        Barriers          Psychosocial barriers to participate in program  There are no identifiable barriers or psychosocial needs.        Screening Interventions          Interventions  Encouraged to exercise           Quality of Life Scores: Quality of Life - 08/21/18 1109    Quality of Life          Select  Quality of Life        Quality of Life Scores          Health/Function Pre  19 %    Socioeconomic Pre  23.5 %    Psych/Spiritual Pre  23.5 %    Family Pre  27.6 %    GLOBAL Pre  22.2 %          Scores of 19 and below usually indicate a poorer quality of life in these areas.  A difference of  2-3 points is a clinically meaningful difference.  A difference of 2-3 points in the total score of the Quality of Life Index has been associated with significant improvement in overall quality of life, self-image, physical symptoms, and general health in studies assessing change in quality of life.  PHQ-9: Recent Review Flowsheet Data    Depression screen [facility] 2/9 08/29/2018 08/20/2018 05/21/2018 10/17/2017 08/04/2017   Decreased Interest 0 0 0 0 0   Down, Depressed, Hopeless 0 0 0 0 0   PHQ - 2 Score 0 0 0 0 0     Interpretation of Total Score  Total Score Depression Severity:  1-4 = Minimal depression, 5-9 = Mild depression, 10-14 = Moderate depression, 15-19 = Moderately severe depression, 20-27 = Severe depression   Psychosocial Evaluation and Intervention: Psychosocial Evaluation - 08/29/18 1535    Psychosocial Evaluation & Interventions          Interventions  Encouraged to exercise with the program and follow exercise prescription    Comments  no psychosocial needs identified, no interventions  necessary. pt enjoys gardening and looking forward to spring.      Expected Outcomes  pt will exhibit positive outlook with good coping skills.      Continue Psychosocial Services   No Follow up required           Psychosocial Re-Evaluation: Psychosocial Re-Evaluation    Psychosocial Re-Evaluation    Row Name 09/06/18 0845   Current issues with  None Identified   Comments  no psychosocial needs identified, no interventions necessary    Expected Outcomes  pt will exhibit positive outlook with good coping skills.    Interventions  Encouraged to attend Cardiac Rehabilitation for the exercise   Continue Psychosocial Services   No Follow up required  Psychosocial Discharge (Final Psychosocial Re-Evaluation): Psychosocial Re-Evaluation - 09/06/18 0845    Psychosocial Re-Evaluation          Current issues with  None Identified    Comments  no psychosocial needs identified, no interventions necessary     Expected Outcomes  pt will exhibit positive outlook with good coping skills.     Interventions  Encouraged to attend Cardiac Rehabilitation for the exercise    Continue Psychosocial Services   No Follow up required           Vocational Rehabilitation: Provide vocational rehab assistance to qualifying candidates.   Vocational Rehab Evaluation & Intervention: Vocational Rehab - 08/21/18 1234    Initial Vocational Rehab Evaluation & Intervention          Assessment shows need for Vocational Rehabilitation  --   incomplete assessment, no included in [facility] packet           Education: Education Goals: Education classes will be provided on a weekly basis, covering required topics. Participant will state understanding/return demonstration of topics presented.  Learning Barriers/Preferences: Learning Barriers/Preferences - 08/21/18 1125    Learning Barriers/Preferences          Learning Barriers  Hearing   dementia   Learning Preferences  Written Material            Education Topics: Count Your Pulse:  -Group instruction provided by verbal instruction, demonstration, patient participation and written materials to support subject.  Instructors address importance of being able to find your pulse and how to count your pulse when at home without a heart monitor.  Patients get hands on experience counting their pulse with staff help and individually.   Heart Attack, Angina, and Risk Factor Modification:  -Group instruction provided by verbal instruction, video, and written materials to support subject.  Instructors address signs and symptoms of angina and heart attacks.    Also discuss risk factors for heart disease and how to make changes to improve heart health risk factors.   Functional Fitness:  -Group instruction provided by verbal instruction, demonstration, patient participation, and written materials to support subject.  Instructors address safety measures for doing things around the house.  Discuss how to get up and down off the floor, how to pick things up properly, how to safely get out of a chair without assistance, and balance training.   Meditation and Mindfulness:  -Group instruction provided by verbal instruction, patient participation, and written materials to support subject.  Instructor addresses importance of mindfulness and meditation practice to help reduce stress and improve awareness.  Instructor also leads participants through a meditation exercise.    Stretching for Flexibility and Mobility:  -Group instruction provided by verbal instruction, patient participation, and written materials to support subject.  Instructors lead participants through series of stretches that are designed to increase flexibility thus improving mobility.  These stretches are additional exercise for major muscle groups that are typically performed during regular warm up and cool down.   Hands Only CPR:  -Group verbal, video, and participation provides a  basic overview of AHA guidelines for community CPR. Role-play of emergencies allow participants the opportunity to practice calling for help and chest compression technique with discussion of AED use.   Hypertension: -Group verbal and written instruction that provides a basic overview of hypertension including the most recent diagnostic guidelines, risk factor reduction with self-care instructions and medication management.    Nutrition I class: Heart Healthy Eating:  -Group instruction provided by PowerPoint slides,  verbal discussion, and written materials to support subject matter. The instructor gives an explanation and review of the Therapeutic Lifestyle Changes diet recommendations, which includes a discussion on lipid goals, dietary fat, sodium, fiber, plant stanol/sterol esters, sugar, and the components of a well-balanced, healthy diet.   Nutrition II class: Lifestyle Skills:  -Group instruction provided by PowerPoint slides, verbal discussion, and written materials to support subject matter. The instructor gives an explanation and review of label reading, grocery shopping for heart health, heart healthy recipe modifications, and ways to make healthier choices when eating out.   Diabetes Question & Answer:  -Group instruction provided by PowerPoint slides, verbal discussion, and written materials to support subject matter. The instructor gives an explanation and review of diabetes co-morbidities, pre- and post-prandial blood glucose goals, pre-exercise blood glucose goals, signs, symptoms, and treatment of hypoglycemia and hyperglycemia, and foot care basics.   Diabetes Blitz:  -Group instruction provided by PowerPoint slides, verbal discussion, and written materials to support subject matter. The instructor gives an explanation and review of the physiology behind type 1 and type 2 diabetes, diabetes medications and rational behind using different medications, pre- and post-prandial  blood glucose recommendations and Hemoglobin A1c goals, diabetes diet, and exercise including blood glucose guidelines for exercising safely.    Portion Distortion:  -Group instruction provided by PowerPoint slides, verbal discussion, written materials, and food models to support subject matter. The instructor gives an explanation of serving size versus portion size, changes in portions sizes over the last 20 years, and what consists of a serving from each food group.   Stress Management:  -Group instruction provided by verbal instruction, video, and written materials to support subject matter.  Instructors review role of stress in heart disease and how to cope with stress positively.   Flowsheet Row CARDIAC REHAB PHASE II EXERCISE from 08/29/2018 in [place]  Date  08/29/18  Educator  RN  Instruction Review Code  2- Demonstrated Understanding      Exercising on Your Own:  -Group instruction provided by verbal instruction, power point, and written materials to support subject.  Instructors discuss benefits of exercise, components of exercise, frequency and intensity of exercise, and end points for exercise.  Also discuss use of nitroglycerin and activating EMS.  Review options of places to exercise outside of rehab.  Review guidelines for sex with heart disease.   Cardiac Drugs I:  -Group instruction provided by verbal instruction and written materials to support subject.  Instructor reviews cardiac drug classes: antiplatelets, anticoagulants, beta blockers, and statins.  Instructor discusses reasons, side effects, and lifestyle considerations for each drug class.   Cardiac Drugs II:  -Group instruction provided by verbal instruction and written materials to support subject.  Instructor reviews cardiac drug classes: angiotensin converting enzyme inhibitors (ACE-I), angiotensin II receptor blockers (ARBs), nitrates, and calcium channel blockers.  Instructor  discusses reasons, side effects, and lifestyle considerations for each drug class.   Anatomy and Physiology of the Circulatory System:  Group verbal and written instruction and models provide basic cardiac anatomy and physiology, with the coronary electrical and arterial systems. Review of: AMI, Angina, Valve disease, Heart Failure, Peripheral Artery Disease, Cardiac Arrhythmia, Pacemakers, and the ICD.   Other Education:  -Group or individual verbal, written, or video instructions that support the educational goals of the cardiac rehab program.   Holiday Eating Survival Tips:  -Group instruction provided by PowerPoint slides, verbal discussion, and written materials to support subject matter. The instructor  gives patients tips, tricks, and techniques to help them not only survive but enjoy the holidays despite the onslaught of food that accompanies the holidays.   Knowledge Questionnaire Score: Knowledge Questionnaire Score - 08/21/18 1127    Knowledge Questionnaire Score          Pre Score  23/24           Core Components/Risk Factors/Patient Goals at Admission: Personal Goals and Risk Factors at Admission - 08/21/18 1127    Core Components/Risk Factors/Patient Goals on Admission           Weight Management  Yes;Obesity;Weight Maintenance;Weight Loss    Intervention  Weight Management: Develop a combined nutrition and exercise program designed to reach desired caloric intake, while maintaining appropriate intake of nutrient and fiber, sodium and fats, and appropriate energy expenditure required for the weight goal.;Weight Management: Provide education and appropriate resources to help participant work on and attain dietary goals.;Weight Management/Obesity: Establish reasonable short term and long term weight goals.;Obesity: Provide education and appropriate resources to help participant work on and attain dietary goals.    Admit Weight  205 lb 14.6 oz (93.4 kg)    Expected Outcomes   Short Term: Continue to assess and modify interventions until short term weight is achieved;Long Term: Adherence to nutrition and physical activity/exercise program aimed toward attainment of established weight goal;Weight Maintenance: Understanding of the daily nutrition guidelines, which includes 25-35% calories from fat, 7% or less cal from saturated fats, less than '200mg'$  cholesterol, less than 1.5gm of sodium, & 5 or more servings of fruits and vegetables daily;Weight Loss: Understanding of general recommendations for a balanced deficit meal plan, which promotes 1-2 lb weight loss per week and includes a negative energy balance of [phone] kcal/d;Understanding recommendations for meals to include 15-35% energy as protein, 25-35% energy from fat, 35-60% energy from carbohydrates, less than '200mg'$  of dietary cholesterol, 20-35 gm of total fiber daily;Understanding of distribution of calorie intake throughout the day with the consumption of 4-5 meals/snacks    Diabetes  Yes    Intervention  Provide education about signs/symptoms and action to take for hypo/hyperglycemia.;Provide education about proper nutrition, including hydration, and aerobic/resistive exercise prescription along with prescribed medications to achieve blood glucose in normal ranges: Fasting glucose 65-99 mg/dL    Expected Outcomes  Short Term: Participant verbalizes understanding of the signs/symptoms and immediate care of hyper/hypoglycemia, proper foot care and importance of medication, aerobic/resistive exercise and nutrition plan for blood glucose control.;Long Term: Attainment of HbA1C < 7%.    Hypertension  Yes    Intervention  Provide education on lifestyle modifcations including regular physical activity/exercise, weight management, moderate sodium restriction and increased consumption of fresh fruit, vegetables, and low fat dairy, alcohol moderation, and smoking cessation.;Monitor prescription use compliance.    Expected Outcomes   Long Term: Maintenance of blood pressure at goal levels.;Short Term: Continued assessment and intervention until BP is < 140/71m HG in hypertensive participants. < 130/839mHG in hypertensive participants with diabetes, heart failure or chronic kidney disease.    Lipids  Yes    Intervention  Provide education and support for participant on nutrition & aerobic/resistive exercise along with prescribed medications to achieve LDL '70mg'$ , HDL >'40mg'$ .    Expected Outcomes  Short Term: Participant states understanding of desired cholesterol values and is compliant with medications prescribed. Participant is following exercise prescription and nutrition guidelines.;Long Term: Cholesterol controlled with medications as prescribed, with individualized exercise RX and with personalized nutrition plan. Value goals: LDL < '70mg'$ ,  HDL > 40 mg.    Stress  Yes    Intervention  Offer individual and/or small group education and counseling on adjustment to heart disease, stress management and health-related lifestyle change. Teach and support self-help strategies.;Refer participants experiencing significant psychosocial distress to appropriate mental health specialists for further evaluation and treatment. When possible, include family members and significant others in education/counseling sessions.    Expected Outcomes  Short Term: Participant demonstrates changes in health-related behavior, relaxation and other stress management skills, ability to obtain effective social support, and compliance with psychotropic medications if prescribed.;Long Term: Emotional wellbeing is indicated by absence of clinically significant psychosocial distress or social isolation.           Core Components/Risk Factors/Patient Goals Review:  Goals and Risk Factor Review    Core Components/Risk Factors/Patient Goals Review    Row Name 08/29/18 1542 09/06/18 0846   Personal Goals Review  Weight  Management/Obesity;Diabetes;Hypertension;Lipids;Stress  Weight Management/Obesity;Diabetes;Hypertension;Lipids;Stress   Review  pt with multiple CAD RF demonstrates eagerness to participate in CR opportunities. pt personal goals are weight loss, increased strength/stamina and improved posture. pt interested in getting rollator for home use.  pt interested in building core strength.    pt with multiple CAD RF demonstrates eagerness to participate in CR opportunities. pt personal goals are weight loss, increased strength/stamina and improved posture. pt interested in getting rollator for home use.  pt interested in building core strength.     Expected Outcomes  pt will participate in CR exercise, nutrition and lifestyle modification to decrease overall RF.    pt will participate in CR exercise, nutrition and lifestyle modification to decrease overall RF.            Core Components/Risk Factors/Patient Goals at Discharge (Final Review):  Goals and Risk Factor Review - 09/06/18 0846    Core Components/Risk Factors/Patient Goals Review          Personal Goals Review  Weight Management/Obesity;Diabetes;Hypertension;Lipids;Stress    Review  pt with multiple CAD RF demonstrates eagerness to participate in CR opportunities. pt personal goals are weight loss, increased strength/stamina and improved posture. pt interested in getting rollator for home use.  pt interested in building core strength.      Expected Outcomes  pt will participate in CR exercise, nutrition and lifestyle modification to decrease overall RF.             ITP Comments: ITP Comments    Row Name 08/21/18 0934 08/29/18 1445 09/06/18 0843   ITP Comments  Medical Director- Dr. Fransico Him, MD  pt started group exercise. pt tolerated light activity without difficulty. pt oriented to exercise equipment and safety routine.  understanding verbalized.   30 day ITP review.  pt demonstrates willingness to participate in CR program.        Comments:

## 2018-09-07 ENCOUNTER — Encounter (HOSPITAL_COMMUNITY): Payer: Medicare Other

## 2018-09-07 ENCOUNTER — Ambulatory Visit (HOSPITAL_COMMUNITY): Payer: Medicare Other

## 2018-09-10 ENCOUNTER — Encounter (HOSPITAL_COMMUNITY)
Admission: RE | Admit: 2018-09-10 | Discharge: 2018-09-10 | Disposition: A | Discharge status: Home / Self Care | Payer: Medicare Other | Source: Ambulatory Visit | Attending: Cardiology | Admitting: Cardiology

## 2018-09-10 ENCOUNTER — Ambulatory Visit (HOSPITAL_COMMUNITY): Payer: Medicare Other

## 2018-09-10 DIAGNOSIS — Z951 Presence of aortocoronary bypass graft: Secondary | ICD-10-CM

## 2018-09-10 DIAGNOSIS — Z952 Presence of prosthetic heart valve: Secondary | ICD-10-CM | POA: Diagnosis not present

## 2018-09-10 NOTE — Progress Notes (Signed)
I have reviewed a Home Exercise Prescription with Cody Phelps is not currently exercising at home.  The patient was advised to walk 2-4 days a week for 30-45 minutes.  Darin and I discussed how to progress their exercise prescription.  The patient stated that their goals were to start walking at home in addition to Cardiac Rehab.  The patient stated that they understand the exercise prescription.  We reviewed exercise guidelines, target heart rate during exercise, RPE Scale, weather conditions, NTG use, endpoints for exercise, warmup and cool down.  Patient is encouraged to come to me with any questions. I will continue to follow up with the patient to assist them with progression and safety.   09/10/2018  3:05 PM   Deitra Mayo BS, ACSM CEP

## 2018-09-12 ENCOUNTER — Ambulatory Visit (HOSPITAL_COMMUNITY): Payer: Medicare Other

## 2018-09-12 ENCOUNTER — Encounter (HOSPITAL_COMMUNITY)
Admission: RE | Admit: 2018-09-12 | Discharge: 2018-09-12 | Disposition: A | Discharge status: Home / Self Care | Payer: Medicare Other | Source: Ambulatory Visit | Attending: Cardiology | Admitting: Cardiology

## 2018-09-12 DIAGNOSIS — Z951 Presence of aortocoronary bypass graft: Secondary | ICD-10-CM

## 2018-09-12 DIAGNOSIS — Z952 Presence of prosthetic heart valve: Secondary | ICD-10-CM

## 2018-09-12 LAB — GLUCOSE, CAPILLARY: Glucose-Capillary: 85 mg/dL (ref 70–99)

## 2018-09-14 ENCOUNTER — Encounter (HOSPITAL_COMMUNITY)
Admission: RE | Admit: 2018-09-14 | Discharge: 2018-09-14 | Disposition: A | Discharge status: Home / Self Care | Payer: Medicare Other | Source: Ambulatory Visit | Attending: Cardiology | Admitting: Cardiology

## 2018-09-14 ENCOUNTER — Ambulatory Visit (HOSPITAL_COMMUNITY): Payer: Medicare Other

## 2018-09-14 DIAGNOSIS — Z952 Presence of prosthetic heart valve: Secondary | ICD-10-CM | POA: Diagnosis not present

## 2018-09-14 DIAGNOSIS — Z951 Presence of aortocoronary bypass graft: Secondary | ICD-10-CM

## 2018-09-14 LAB — GLUCOSE, CAPILLARY: Glucose-Capillary: 90 mg/dL (ref 70–99)

## 2018-09-17 ENCOUNTER — Ambulatory Visit (HOSPITAL_COMMUNITY): Payer: Medicare Other

## 2018-09-17 ENCOUNTER — Encounter (HOSPITAL_COMMUNITY)
Admission: RE | Admit: 2018-09-17 | Discharge: 2018-09-17 | Disposition: A | Discharge status: Home / Self Care | Payer: Medicare Other | Source: Ambulatory Visit | Attending: Cardiology | Admitting: Cardiology

## 2018-09-17 DIAGNOSIS — Z952 Presence of prosthetic heart valve: Secondary | ICD-10-CM

## 2018-09-17 DIAGNOSIS — Z951 Presence of aortocoronary bypass graft: Secondary | ICD-10-CM

## 2018-09-17 LAB — GLUCOSE, CAPILLARY: Glucose-Capillary: 117 mg/dL — ABNORMAL HIGH (ref 70–99)

## 2018-09-19 ENCOUNTER — Encounter (HOSPITAL_COMMUNITY)
Admission: RE | Admit: 2018-09-19 | Discharge: 2018-09-19 | Disposition: A | Discharge status: Home / Self Care | Payer: Medicare Other | Source: Ambulatory Visit | Attending: Cardiology | Admitting: Cardiology

## 2018-09-19 ENCOUNTER — Ambulatory Visit (HOSPITAL_COMMUNITY): Payer: Medicare Other

## 2018-09-19 DIAGNOSIS — Z952 Presence of prosthetic heart valve: Secondary | ICD-10-CM | POA: Diagnosis not present

## 2018-09-19 DIAGNOSIS — Z951 Presence of aortocoronary bypass graft: Secondary | ICD-10-CM

## 2018-09-21 ENCOUNTER — Encounter: Payer: Self-pay | Admitting: Cardiology

## 2018-09-21 ENCOUNTER — Encounter (HOSPITAL_COMMUNITY)
Admission: RE | Admit: 2018-09-21 | Discharge: 2018-09-21 | Disposition: A | Discharge status: Home / Self Care | Payer: Medicare Other | Source: Ambulatory Visit | Attending: Cardiology | Admitting: Cardiology

## 2018-09-21 ENCOUNTER — Ambulatory Visit (INDEPENDENT_AMBULATORY_CARE_PROVIDER_SITE_OTHER): Payer: Medicare Other | Admitting: Cardiology

## 2018-09-21 ENCOUNTER — Ambulatory Visit (HOSPITAL_COMMUNITY): Payer: Medicare Other

## 2018-09-21 VITALS — BP 140/65 | HR 58 | Ht 68.0 in | Wt 210.3 lb

## 2018-09-21 DIAGNOSIS — Z9889 Other specified postprocedural states: Secondary | ICD-10-CM | POA: Diagnosis not present

## 2018-09-21 DIAGNOSIS — I480 Paroxysmal atrial fibrillation: Secondary | ICD-10-CM

## 2018-09-21 DIAGNOSIS — I484 Atypical atrial flutter: Secondary | ICD-10-CM | POA: Diagnosis not present

## 2018-09-21 DIAGNOSIS — I10 Essential (primary) hypertension: Secondary | ICD-10-CM

## 2018-09-21 DIAGNOSIS — Z952 Presence of prosthetic heart valve: Secondary | ICD-10-CM

## 2018-09-21 DIAGNOSIS — D508 Other iron deficiency anemias: Secondary | ICD-10-CM

## 2018-09-21 DIAGNOSIS — I25118 Atherosclerotic heart disease of native coronary artery with other forms of angina pectoris: Secondary | ICD-10-CM

## 2018-09-21 DIAGNOSIS — Z8679 Personal history of other diseases of the circulatory system: Secondary | ICD-10-CM

## 2018-09-21 DIAGNOSIS — Z953 Presence of xenogenic heart valve: Secondary | ICD-10-CM

## 2018-09-21 DIAGNOSIS — Z951 Presence of aortocoronary bypass graft: Secondary | ICD-10-CM

## 2018-09-21 DIAGNOSIS — I5032 Chronic diastolic (congestive) heart failure: Secondary | ICD-10-CM

## 2018-09-21 HISTORY — DX: Chronic diastolic (congestive) heart failure: I50.32

## 2018-09-21 MED ORDER — FERROUS SULFATE 325 (65 FE) MG PO TABS: 325.0000 mg | ORAL_TABLET | Freq: Every day | ORAL | 2 refills | Status: DC

## 2018-09-21 MED ORDER — AMIODARONE HCL 200 MG PO TABS: 200.0000 mg | ORAL_TABLET | Freq: Every day | ORAL | 1 refills | Status: DC

## 2018-09-21 NOTE — Progress Notes (Signed)
Subjective:  Primary Physician:  [name], MD  Patient ID: [facility], male    DOB: 1941-05-07, 78 y.o.   MRN: [id number]  Chief Complaint  Patient presents with  . Atrial Flutter    F/U    HPI: Cody [facility]  is a 78 y.o. male  with invasive prostate cancer diagnosed in Nov 2018 and is S/P chemo and RT and in remission, anemia of chronic disease and also mild iron defeciency, hypertension, hyperlipidemia, obstructive sleep apnea on CPAP, controlled diabetes mellitus, mild obesity, mostly truncal obesity, Late presentation of antero-lat MI on 03/01/2017.  He underwent aortic valve replacement along with LIMA to D1 on 05/30/2018 by Dr. Darylene Price with implantation of a bovine pericardial tissue valve along with cryoablation for A. Fib and clipping of left atrial appendage. Patient underwent direct cardioversion on 08/10/2018 for atrial flutter with conversion to normal sinus rhythm.   Patient is here to see me for an acute visit for follow-up of atrial flutter, he is presently in Cardio pulmonary rehabilitation, was noted to be dyspneic and also in atrial flutter, decreased exercise tolerance was also noted.  Fortunately patient is presently doing well, states that although he has noticed slight dyspnea, leg edema has remained stable, no PND or orthopnea.    Past Medical History:  Diagnosis Date  . Acute anterior wall MI ([place]) 03/01/2018  . Acute combined systolic and diastolic heart failure ([place]) 03/15/2018  . Bronchitis, acute 05/04/2012  . Carotid stenosis, left followed by dr [facility], cardiologist--- bilateral bruit per note   per dr Cody [facility]ps note 40/1027  LICA 25-36% stenosis per duplex 05-14-2014  . Chronic diastolic CHF (congestive heart failure) ([facility]) 09/21/2018  . Diverticulosis   . DM (diabetes mellitus) type 2, uncontrolled, with ketoacidosis ([facility]) 11/25/2013  . ED (erectile dysfunction)   . First degree heart block   . GERD (gastroesophageal reflux disease)   . Heart  murmur   . Hypertension    cardiologsit-  dr [facility]  . Iron deficiency anemia   . Mixed hyperlipidemia   . Morbid obesity ([facility])   . Myocardial infarction ([facility])    02/28/18  . OA (osteoarthritis)    right hip  . OSA on CPAP    followed by dr dohmeier, uses CPAP  . PAF (paroxysmal atrial fibrillation) ([name])    a. on Eliquis  . Prostate cancer ([place])    dx 03-17-2017 (bx)  Stage T2a, Gleason 4+4, PSA  4.43, vol 32.32--  s/p radiatctive prostate seed implants 07-10-2017 then IMRT and ADT  . RLS (restless legs syndrome)   . S/P aortic valve replacement with bioprosthetic valve 05/30/2018   23 mm Edwards Inspiris Resilia stented bovine pericardial tissue valve  . 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 S/P Maze operation for atrial fibrillation 05/30/2018   Complete bilateral atrial lesion set using bipolar radiofrequency and cryothermy ablation with clipping of LA appendage  . Scoliosis   . Severe aortic stenosis   . Trigger finger   . Type 2 diabetes mellitus ([facility])   . Wears partial dentures    upper and lower    Past Surgical History:  Procedure Laterality Date  . AORTIC VALVE REPLACEMENT N/A 05/30/2018   Procedure: AORTIC VALVE REPLACEMENT (AVR) using Inspiris Valve, Size 23;  Surgeon: [name], MD;  Location: [facility];  Service: Open Heart Surgery;  Laterality: N/A;  . APPENDECTOMY  1988  . BILATERAL TOTAL ETHMOIDECTOMY AND SPHENOIDECTOMY  01-14-2009    dr bates   [facility]  . CARDIOVERSION N/A 08/10/2018   Procedure: CARDIOVERSION;  Surgeon: Adrian Prows, MD;  Location: [place];  Service: Cardiovascular;  Laterality: N/A;  . CARPAL TUNNEL RELEASE Bilateral 2013  . CATARACT EXTRACTION W/ INTRAOCULAR LENS  IMPLANT, BILATERAL  date?  . CORONARY ARTERY BYPASS GRAFT N/A 05/30/2018   Procedure: CORONARY ARTERY BYPASS GRAFTING (CABG) x one using left internal mammary artery;  Surgeon: [name], MD;  Location: [facility];  Service: Open Heart Surgery;  Laterality:  N/A;  . CORONARY BALLOON ANGIOPLASTY N/A 03/01/2018   Procedure: CORONARY BALLOON ANGIOPLASTY;  Surgeon: Adrian Prows, MD;  Location: [facility];  Service: Cardiovascular;  Laterality: N/A;  . CORONARY/GRAFT ACUTE MI REVASCULARIZATION N/A 03/01/2018   Procedure: Coronary/Graft Acute MI Revascularization;  Surgeon: Adrian Prows, MD;  [facility];  Service: Cardiovascular;  Laterality: N/A;  . CYSTOSCOPY  07/19/2017   Procedure: CYSTOSCOPY;  Surgeon: Nickie Retort, MD;  Location: [facility];  Service: Urology;;  No seeds found in [place]  . LAMINECTOMY WITH POSTERIOR LATERAL ARTHRODESIS LEVEL 2 N/A 02/12/2014   Procedure: LUMBAR TWO-THREE,LUIMBAR THREE-FOUR LAMINECTOMY/FORAMINOTOMY;POSSIBLE POSTEROLATERAL ARTHRODESIS WITH AUTOGRAFT;  Surgeon: [name], MD;  Location: [facility];  Service: Neurosurgery;  Laterality: N/A;  . LEFT HEART CATH AND CORONARY ANGIOGRAPHY N/A 03/01/2018   Procedure: LEFT HEART CATH AND CORONARY ANGIOGRAPHY;  Surgeon: Adrian Prows, MD;  Location: [place] [facility];  Service: Cardiovascular;  Laterality: N/A;  . MAZE N/A 05/30/2018   Procedure: MAZE;  Surgeon: [name], MD;  Location: [facility];  Service: Open Heart Surgery;  Laterality: N/A;  . ORIF RIGHT ANKLE FX'S  05/05/2001   retained hardware  . PROSTATE BIOPSY  03-17-2017   dr Pilar Jarvis office  . RADIOACTIVE SEED IMPLANT N/A 07/19/2017   Procedure: RADIOACTIVE SEED IMPLANT/BRACHYTHERAPY IMPLANT;  Surgeon: Nickie Retort, MD;  Location: [facility];  Service: Urology;  Laterality: N/A;  69 seeds implanted  . SPACE OAR INSTILLATION N/A 07/19/2017   Procedure: SPACE OAR INSTILLATION;  Surgeon: Nickie Retort, MD;  Location: [facility];  Service: Urology;  Laterality: N/A;  . TEE WITHOUT CARDIOVERSION  03/20/2012   Procedure: TRANSESOPHAGEAL ECHOCARDIOGRAM (TEE);  Surgeon: Laverda Page, MD;  Location[facility];  Service:  Cardiovascular;  Laterality: N/A;  normal LV; normal EF; normal RV; normal LA w/ left atrial appendage very small, normal function, interatrial septum intact without defect; normal RA; trace MR,TR, & PI; mild AV calcification and senile degeneration w/ mild stenosis, AVA 1.7cm^2;;   . TEE WITHOUT CARDIOVERSION N/A 05/30/2018   Procedure: TRANSESOPHAGEAL ECHOCARDIOGRAM (TEE);  Surgeon: [name], MD;  Location: [place];  Service: Open Heart Surgery;  Laterality: N/A;  . TOTAL HIP ARTHROPLASTY Right 10/23/2017   Procedure: TOTAL HIP ARTHROPLASTY ANTERIOR APPROACH;  Surgeon: Frederik Pear, MD;  Location: [place];  Service: Orthopedics;  Laterality: Right;  . TRANSTHORACIC ECHOCARDIOGRAM  01-11-2017   dr [facility]  (per echo note, no significant change in seveity of AS, no other diagnostic change)   moderate concentric LVH, ef 48%, grade 1 diastolic dysfunction/  moderate LAE/  mild , grade 1 AR w/ moderate AV calcification, mild to moderate restricted AV leaflets w/ moderate AS, AVA 1.16cm^2, peak grandient 458mHg, mean grandient 352mg/  trace MR, mild calcification MV annulus , mild MV leaflet calcification, mild MVS, peak grandient 4.58m89m, mean grandient 2.7mm358m trace TR  Social History   Socioeconomic History  . Marital status: Married    Spouse name: Butch Penny  . Number of children: 2  . Years of education: 43  . Highest education level: Not on file  Occupational History  . Not on file  Social Needs  . Financial resource strain: Not on file  . Food insecurity:    Worry: Not on file    Inability: Not on file  . Transportation needs:    Medical: Not on file    Non-medical: Not on file  Tobacco Use  . Smoking status: Former Smoker    Years: 1.00    Types: Cigarettes    Last attempt to quit: 02/04/1967    Years since quitting: 51.6  . Smokeless tobacco: Never Used  . Tobacco comment: smoked 1 q2-3 days  Substance and Sexual Activity  . Alcohol use: Yes    Comment: occasional   . Drug  use: No  . Sexual activity: Yes  Lifestyle  . Physical activity:    Days per week: Not on file    Minutes per session: Not on file  . Stress: Not on file  Relationships  . Social connections:    Talks on phone: Not on file    Gets together: Not on file    Attends religious service: Not on file    Active member of club or organization: Not on file    Attends meetings of clubs or organizations: Not on file    Relationship status: Not on file  . Intimate partner violence:    Fear of current or ex partner: Not on file    Emotionally abused: Not on file    Physically abused: Not on file    Forced sexual activity: Not on file  Other Topics Concern  . Not on file  Social History Narrative   Patient is married Butch Penny).   Patient has two children.   Patient is retired.   Patient has a college education.   Patient is right-handed.   Patient drinks two cups of coffee per day and 2-3 cups of Diet soda daily and limited tea.          Current Outpatient Medications on File Prior to Visit  Medication Sig Dispense Refill  . acetaminophen (TYLENOL) 325 MG tablet Take 1 tablet (325 mg total) by mouth every 4 (four) hours as needed (headache, pain). 60 tablet 3  . atorvastatin (LIPITOR) 80 MG tablet Take 1 tablet (80 mg total) by mouth daily at 6 PM. (Patient taking differently: Take 80 mg by mouth every evening. ) 60 tablet 3  . colesevelam (WELCHOL) 625 MG tablet Take 625 mg by mouth 3 (three) times daily.     Marland Kitchen donepezil (ARICEPT) 10 MG tablet Take 10 mg by mouth every morning.     Marland Kitchen esomeprazole (NEXIUM) 40 MG capsule Take 40 mg by mouth daily.     . insulin glargine (LANTUS SOLOSTAR) 100 UNIT/ML injection Inject 20-44 Units into the skin at bedtime as needed (for BGL >150).     . memantine (NAMENDA) 5 MG tablet Take 5 mg by mouth 2 (two) times daily.    . metoprolol tartrate (LOPRESSOR) 25 MG tablet Take 0.5 tablets (12.5 mg total) by mouth 2 (two) times daily. 60 tablet 3  . mirabegron  ER (MYRBETRIQ) 50 MG TB24 tablet Take 50 mg by mouth every evening.     Marland Kitchen omega-3 acid ethyl esters (LOVAZA) 1 G capsule Take 2 g by mouth daily.     Marland Kitchen  Rivaroxaban (XARELTO) 15 MG TABS tablet Take 15 mg by mouth daily.     Marland Kitchen rOPINIRole (REQUIP) 2 MG tablet Take 2 mg by mouth 2 (two) times daily. 1600 & at bedtime    . spironolactone (ALDACTONE) 25 MG tablet Take 0.5 tablets (12.5 mg total) by mouth daily. 30 tablet 3  . tamsulosin (FLOMAX) 0.4 MG CAPS capsule Take 0.4 mg by mouth daily after supper.     . valsartan-hydrochlorothiazide (DIOVAN-HCT) 80-12.5 MG tablet Take 1 tablet by mouth daily.    . ONE TOUCH ULTRA TEST test strip 1 strip by Other route daily.    . potassium chloride SA (K-DUR,KLOR-CON) 20 MEQ tablet Take 1 tablet (20 mEq total) by mouth daily with breakfast. (Patient not taking: Reported on 09/21/2018) 30 tablet 1  . torsemide (DEMADEX) 20 MG tablet Take 1 tablet (20 mg total) by mouth daily. (Patient not taking: Reported on 09/21/2018) 30 tablet 1   No current facility-administered medications on file prior to visit.      Review of Systems  Constitutional: Negative for malaise/fatigue and weight loss.  Respiratory: Positive for shortness of breath. Negative for cough and hemoptysis.   Cardiovascular: Positive for leg swelling (stable). Negative for chest pain, palpitations and claudication.  Gastrointestinal: Negative for abdominal pain, blood in stool, constipation, heartburn and vomiting.  Genitourinary: Negative for dysuria.  Musculoskeletal: Positive for back pain and joint pain. Negative for myalgias.  Neurological: Negative for dizziness, focal weakness and headaches.  Endo/Heme/Allergies: Does not bruise/bleed easily.  Psychiatric/Behavioral: Negative for depression. The patient is not nervous/anxious.   All other systems reviewed and are negative.     Objective:  Blood pressure 140/65, pulse (!) 58, height _0  (1.727 m), weight 210 lb 4.8 oz (95.4 kg), SpO2 100  %. Body mass index is 31.98 kg/m.  Physical Exam  Constitutional: He appears healthy. No distress.  Eyes: Conjunctivae are normal.  Neck: Neck supple. No JVD present.  Cardiovascular: Regular rhythm, normal heart sounds, intact distal pulses and normal pulses. Exam reveals no gallop.  No murmur heard. Pulmonary/Chest: Breath sounds normal. He exhibits no tenderness.  Abdominal: Soft. Bowel sounds are normal.  obese  Musculoskeletal: Normal range of motion.        General: Edema (2+ pitting, stable) present.  Neurological: He is alert and oriented to person, place, and time.  Skin: Skin is warm.    CARDIAC STUDIES:  Echocardiogram 07/11/2018: Left ventricle cavity is normal in size. Abnormal septal wall motion due to post-operative coronary artery bypass graft. Diastolic function assessment limited due to mitral annular calcification and post op status. Calculated EF 65%. Left atrial cavity is mildly dilated. Well seated bioprosthetic aortic valve with normal functioning. Mean PG 8 mmHg likely normal for bioprosthetic valve. Moderate calcification of the mitral valve annulus and leaflets. No significant stenosis. Moderate (Grade II) mitral regurgitation. Compared to previous study on 08/24/2017, bioprosthetic aortic valve is new.   Coronary angiogram 03/20/2018: Left main mildly calcified but normal, LAD large caliber vessel, mild to moderate diffuse disease followed by occlusion in the mid to distal segment, distally the LAD appears to be diffusely diseased with TIMI I flow.  Aborted PTCA. D-2 is moderate to  large sized with tandem 80 to 90% stenosis. Mild disease in other vessels. Severe aortic stenosis with peak to peak gradient of 69 and mean gradient of 53 mmHg.  Severely calcified mitral annulus and moderately calcified aortic valve.   Assessment & Recommendations:   1. Atypical atrial flutter (  [place]) - EKG 12-Lead - amiodarone (PACERONE) 200 MG tablet; Take 1 tablet (200 mg total)  by mouth daily.  2. Paroxysmal atrial fibrillation ([name]) CHA2DS2-VASc Score is 5.  -(CHF; HTN; vasc disease DM,  Male = 1; Age <65 =0; 65-74 = 1,  >75 =2; stroke = 2).  -(Yearly risk of stroke: Score of 1=1.3; 2=2.2; 3=3.2; 4=4; 5=6.7; 6=9.8; 7=>9.8)  3. Essential hypertension  4. Coronary artery disease of native artery of native heart with stable angina pectoris ([place])  5. S/P aortic valve replacement with bioprosthetic valve CABG X 1 plus AV replacement and LIMA to D-1 on 05/30/2018 by Lilly Cove, MD: Margaretha Sheffield Resilia Stented Bovine Pericardial Tissue Valve (size 20m, ref # 11500A, serial # [id number]. Maze Procedure complete bilateral atrial esion set using bipolar radiofrequency and cryothermy ablation clipping of left atrial appendage (Atriclip size 455m  6. Other iron deficiency anemia - ferrous sulfate 325 (65 FE) MG tablet; Take 1 tablet (325 mg total) by mouth daily with breakfast.  Laboratory examination: Labs 08/03/2017: Serum glucose 90 mg, BUN 16, creatinine 1.04, eGFR 70 mL, potassium 4.6.  06/29/2018: RBC 3.29, Hgb 9.3, Hct 28.5, CBC otherwise normal. Glucose 124, creatinine 1.08,eGFR 67/77, potassium 4.7, CMP normal. Cholesterol 115, triglycerides 114, HDL 48, LDL 44.  Recommendation:  Patient is here to see me for an acute visit for follow-up of atrial flutter, he is presently in Cardio pulmonary rehabilitation, was noted to be dyspneic and also in atrial flutter, he had undergone Direct current cardioversion on 08/10/2018.  I have just increased his amiodarone from 100 mg to 200 mg daily, we will wait and watch was the next one week if he spontaneously converts back to sinus rhythm will continue medical management otherwise in view of symptomatic atrial flutter, in the form of dyspnea we will schedule him for direct current cardioversion.  His heart rate has slowed down, I suspect with increasing the dose of amiodarone, he probably will selfconverted.  With  regard to anticoagulation, continue the same With Xarelto, I have refilled his iron supplements.  There is no bleeding diathesis.  He has not had any angina pectoris.  I'd like to see him back in 3 months or sooner if problems.  I also discussed with him that if he continues to persist in atrial flutter/atrial fibrillation and his symptoms of fatigue and dyspnea resolved, we may consider rate control strategy only.  He complains of backache and wants to proceed with spinal injection, I have discussed the need for continued anticoagulation if we decide to proceed with cardioversion.  They are willing to wait.  JaAdrian ProwsMD, FAALPine Surgery [facility]/21/2020, 5:59 PM PiOgden Dunesardiovascular. PACallenderager: [phone] Office: [phone] no answer Cell [phone]

## 2018-09-24 ENCOUNTER — Encounter (HOSPITAL_COMMUNITY)
Admission: RE | Admit: 2018-09-24 | Discharge: 2018-09-24 | Disposition: A | Discharge status: Home / Self Care | Payer: Medicare Other | Source: Ambulatory Visit | Attending: Cardiology | Admitting: Cardiology

## 2018-09-24 ENCOUNTER — Ambulatory Visit (HOSPITAL_COMMUNITY): Payer: Medicare Other

## 2018-09-24 DIAGNOSIS — Z952 Presence of prosthetic heart valve: Secondary | ICD-10-CM

## 2018-09-24 DIAGNOSIS — Z951 Presence of aortocoronary bypass graft: Secondary | ICD-10-CM

## 2018-09-24 LAB — GLUCOSE, CAPILLARY: Glucose-Capillary: 110 mg/dL — ABNORMAL HIGH (ref 70–99)

## 2018-09-26 ENCOUNTER — Encounter (HOSPITAL_COMMUNITY)
Admission: RE | Admit: 2018-09-26 | Discharge: 2018-09-26 | Disposition: A | Discharge status: Home / Self Care | Payer: Medicare Other | Source: Ambulatory Visit | Attending: Cardiology | Admitting: Cardiology

## 2018-09-26 ENCOUNTER — Ambulatory Visit (HOSPITAL_COMMUNITY): Payer: Medicare Other

## 2018-09-26 DIAGNOSIS — Z952 Presence of prosthetic heart valve: Secondary | ICD-10-CM | POA: Diagnosis not present

## 2018-09-26 DIAGNOSIS — Z951 Presence of aortocoronary bypass graft: Secondary | ICD-10-CM

## 2018-09-28 ENCOUNTER — Encounter (HOSPITAL_COMMUNITY)
Admission: RE | Admit: 2018-09-28 | Discharge: 2018-09-28 | Disposition: A | Discharge status: Home / Self Care | Payer: Medicare Other | Source: Ambulatory Visit | Attending: Cardiology | Admitting: Cardiology

## 2018-09-28 ENCOUNTER — Encounter: Payer: Self-pay | Admitting: Oncology

## 2018-09-28 ENCOUNTER — Ambulatory Visit (HOSPITAL_COMMUNITY): Payer: Medicare Other

## 2018-09-28 DIAGNOSIS — Z952 Presence of prosthetic heart valve: Secondary | ICD-10-CM

## 2018-09-28 DIAGNOSIS — Z951 Presence of aortocoronary bypass graft: Secondary | ICD-10-CM

## 2018-10-01 ENCOUNTER — Encounter (HOSPITAL_COMMUNITY)
Admission: RE | Admit: 2018-10-01 | Discharge: 2018-10-01 | Disposition: A | Discharge status: Home / Self Care | Payer: Medicare Other | Source: Ambulatory Visit | Attending: Cardiology | Admitting: Cardiology

## 2018-10-01 ENCOUNTER — Ambulatory Visit (HOSPITAL_COMMUNITY): Payer: Medicare Other

## 2018-10-01 DIAGNOSIS — Z952 Presence of prosthetic heart valve: Secondary | ICD-10-CM | POA: Insufficient documentation

## 2018-10-01 DIAGNOSIS — Z951 Presence of aortocoronary bypass graft: Secondary | ICD-10-CM | POA: Diagnosis present

## 2018-10-01 DIAGNOSIS — I252 Old myocardial infarction: Secondary | ICD-10-CM | POA: Insufficient documentation

## 2018-10-01 DIAGNOSIS — D508 Other iron deficiency anemias: Secondary | ICD-10-CM | POA: Insufficient documentation

## 2018-10-01 DIAGNOSIS — E119 Type 2 diabetes mellitus without complications: Secondary | ICD-10-CM | POA: Diagnosis not present

## 2018-10-01 DIAGNOSIS — I25118 Atherosclerotic heart disease of native coronary artery with other forms of angina pectoris: Secondary | ICD-10-CM | POA: Diagnosis not present

## 2018-10-01 DIAGNOSIS — I110 Hypertensive heart disease with heart failure: Secondary | ICD-10-CM | POA: Insufficient documentation

## 2018-10-01 DIAGNOSIS — I480 Paroxysmal atrial fibrillation: Secondary | ICD-10-CM | POA: Diagnosis not present

## 2018-10-01 DIAGNOSIS — E782 Mixed hyperlipidemia: Secondary | ICD-10-CM | POA: Insufficient documentation

## 2018-10-01 DIAGNOSIS — G4733 Obstructive sleep apnea (adult) (pediatric): Secondary | ICD-10-CM | POA: Diagnosis not present

## 2018-10-01 DIAGNOSIS — Z8546 Personal history of malignant neoplasm of prostate: Secondary | ICD-10-CM | POA: Diagnosis not present

## 2018-10-01 DIAGNOSIS — I5032 Chronic diastolic (congestive) heart failure: Secondary | ICD-10-CM | POA: Insufficient documentation

## 2018-10-03 ENCOUNTER — Ambulatory Visit (HOSPITAL_COMMUNITY): Payer: Medicare Other

## 2018-10-03 ENCOUNTER — Encounter (HOSPITAL_COMMUNITY)
Admission: RE | Admit: 2018-10-03 | Discharge: 2018-10-03 | Disposition: A | Discharge status: Home / Self Care | Payer: Medicare Other | Source: Ambulatory Visit | Attending: Cardiology | Admitting: Cardiology

## 2018-10-03 DIAGNOSIS — Z952 Presence of prosthetic heart valve: Secondary | ICD-10-CM | POA: Diagnosis not present

## 2018-10-03 DIAGNOSIS — Z951 Presence of aortocoronary bypass graft: Secondary | ICD-10-CM

## 2018-10-03 NOTE — Progress Notes (Signed)
Cardiac Individual Treatment Plan  Patient Details  Name: Cody Phelps MRN: [id number] Date of Birth: [birth year] Referring Provider:   Flowsheet Row CARDIAC REHAB PHASE II ORIENTATION from 08/21/2018 in [place]  Referring Provider  Dr. [name]      Initial Encounter Date:  Flowsheet Row CARDIAC REHAB PHASE II ORIENTATION from 08/21/2018 in [place]  Date  08/21/18      Visit Diagnosis: 05/30/18 AVR  05/30/18 CABG x1  Patient's Home Medications on Admission:  Current Outpatient Medications:  .  acetaminophen (TYLENOL) 325 MG tablet, Take 1 tablet (325 mg total) by mouth every 4 (four) hours as needed (headache, pain)., Disp: 60 tablet, Rfl: 3 .  amiodarone (PACERONE) 200 MG tablet, Take 1 tablet (200 mg total) by mouth daily., Disp: 90 tablet, Rfl: 1 .  atorvastatin (LIPITOR) 80 MG tablet, Take 1 tablet (80 mg total) by mouth daily at 6 PM. (Patient taking differently: Take 80 mg by mouth every evening. ), Disp: 60 tablet, Rfl: 3 .  colesevelam (WELCHOL) 625 MG tablet, Take 625 mg by mouth 3 (three) times daily. , Disp: , Rfl:  .  donepezil (ARICEPT) 10 MG tablet, Take 10 mg by mouth every morning. , Disp: , Rfl:  .  esomeprazole (NEXIUM) 40 MG capsule, Take 40 mg by mouth daily. , Disp: , Rfl:  .  ferrous sulfate 325 (65 FE) MG tablet, Take 1 tablet (325 mg total) by mouth daily with breakfast., Disp: 90 tablet, Rfl: 2 .  insulin glargine (LANTUS SOLOSTAR) 100 UNIT/ML injection, Inject 20-44 Units into the skin at bedtime as needed (for BGL >150). , Disp: , Rfl:  .  memantine (NAMENDA) 5 MG tablet, Take 5 mg by mouth 2 (two) times daily., Disp: , Rfl:  .  metoprolol tartrate (LOPRESSOR) 25 MG tablet, Take 0.5 tablets (12.5 mg total) by mouth 2 (two) times daily., Disp: 60 tablet, Rfl: 3 .  mirabegron ER (MYRBETRIQ) 50 MG TB24 tablet, Take 50 mg by mouth every evening. , Disp: , Rfl:  .  omega-3 acid ethyl esters  (LOVAZA) 1 G capsule, Take 2 g by mouth daily. , Disp: , Rfl:  .  ONE TOUCH ULTRA TEST test strip, 1 strip by Other route daily., Disp: , Rfl:  .  potassium chloride SA (K-DUR,KLOR-CON) 20 MEQ tablet, Take 1 tablet (20 mEq total) by mouth daily with breakfast. (Patient not taking: Reported on 09/21/2018), Disp: 30 tablet, Rfl: 1 .  Rivaroxaban (XARELTO) 15 MG TABS tablet, Take 15 mg by mouth daily. , Disp: , Rfl:  .  rOPINIRole (REQUIP) 2 MG tablet, Take 2 mg by mouth 2 (two) times daily. 1600 & at bedtime, Disp: , Rfl:  .  spironolactone (ALDACTONE) 25 MG tablet, Take 0.5 tablets (12.5 mg total) by mouth daily., Disp: 30 tablet, Rfl: 3 .  tamsulosin (FLOMAX) 0.4 MG CAPS capsule, Take 0.4 mg by mouth daily after supper. , Disp: , Rfl:  .  torsemide (DEMADEX) 20 MG tablet, Take 1 tablet (20 mg total) by mouth daily. (Patient not taking: Reported on 09/21/2018), Disp: 30 tablet, Rfl: 1 .  valsartan-hydrochlorothiazide (DIOVAN-HCT) 80-12.5 MG tablet, Take 1 tablet by mouth daily., Disp: , Rfl:   Past Medical History: Past Medical History:  Diagnosis Date  . Acute anterior wall MI ([place]) 03/01/2018  . Acute combined systolic and diastolic heart failure ([place]) 03/15/2018  . Bronchitis, acute 05/04/2012  . Carotid stenosis, left followed by dr [name],  cardiologist--- bilateral bruit per note   per dr [name] note 74/1287  LICA 86-76% stenosis per duplex 05-14-2014  . Chronic diastolic CHF (congestive heart failure) ([place]) 09/21/2018  . Diverticulosis   . DM (diabetes mellitus) type 2, uncontrolled, with ketoacidosis ([place]) 11/25/2013  . ED (erectile dysfunction)   . First degree heart block   . GERD (gastroesophageal reflux disease)   . Heart murmur   . Hypertension    cardiologsit-  dr [name]  . Iron deficiency anemia   . Mixed hyperlipidemia   . Morbid obesity ([place])   . Myocardial infarction ([place])    02/28/18  . OA (osteoarthritis)    right hip  . OSA on CPAP    followed by dr dohmeier, uses CPAP  .  PAF (paroxysmal atrial fibrillation) ([name])    a. on Eliquis  . Prostate cancer ([place])    dx 03-17-2017 (bx)  Stage T2a, Gleason 4+4, PSA  4.43, vol 32.32--  s/p radiatctive prostate seed implants 07-10-2017 then IMRT and ADT  . RLS (restless legs syndrome)   . S/P aortic valve replacement with bioprosthetic valve 05/30/2018   23 mm Edwards Inspiris Resilia stented bovine pericardial tissue valve  . 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 S/P Maze operation for atrial fibrillation 05/30/2018   Complete bilateral atrial lesion set using bipolar radiofrequency and cryothermy ablation with clipping of LA appendage  . Scoliosis   . Severe aortic stenosis   . Trigger finger   . Type 2 diabetes mellitus ([place])   . Wears partial dentures    upper and lower    Tobacco Use: Social History   Tobacco Use  Smoking Status Former Smoker  . Years: 1.00  . Types: Cigarettes  . Last attempt to quit: 02/04/1967  . Years since quitting: 51.6  Smokeless Tobacco Never Used  Tobacco Comment   smoked 1 q2-3 days    Labs: Recent Review Flowsheet Data    Labs for ITP Cardiac and Pulmonary Rehab Latest Ref Rng & Units 05/30/2018 05/30/2018 05/30/2018 05/31/2018 06/01/2018   Cholestrol 0 - 200 mg/dL - - - - -   LDLCALC 0 - 99 mg/dL - - - - -   HDL >40 mg/dL - - - - -   Trlycerides <150 mg/dL - - - - -   Hemoglobin A1c 4.8 - 5.6 % - - - - -   PHART 7.350 - 7.450 7.336(L) 7.325(L) - - -   PCO2ART 32.0 - 48.0 mmHg 42.8 42.7 - - -   HCO3 20.0 - 28.0 mmol/L 22.7 22.1 - - -   TCO2 22 - 32 mmol/L 24 23 23  - -   ACIDBASEDEF 0.0 - 2.0 mmol/L 3.0(H) 4.0(H) - - -   O2SAT % 99.0 97.0 - 55.7 75.4      Capillary Blood Glucose: Lab Results  Component Value Date   GLUCAP 110 (H) 09/21/2018   GLUCAP 117 (H) 09/17/2018   GLUCAP 90 09/14/2018   GLUCAP 85 09/12/2018   GLUCAP 107 (H) 09/05/2018     Exercise Target Goals: Exercise Program Goal: Individual exercise prescription set using results  from initial 6 min walk test and THRR while considering  patient's activity barriers and safety.   Exercise Prescription Goal: Initial exercise prescription builds to 30-45 minutes a day of aerobic activity, 2-3 days per week.  Home exercise guidelines will be given to patient during program as part of exercise prescription that the participant will acknowledge.  Activity Barriers & Risk Stratification: Activity Barriers & Cardiac Risk Stratification - 08/21/18 0937    Activity Barriers & Cardiac Risk Stratification          Activity Barriers  Right Hip Replacement;Other (comment)    Comments  Previous back and ankle surgery    Cardiac Risk Stratification  High           6 Minute Walk: 6 Minute Walk    6 Minute Walk    Row Name 08/21/18 1122   Phase  Initial   Distance  1069 feet   Walk Time  6 minutes   # of Rest Breaks  0   MPH  2.02   METS  2.02   RPE  13   Symptoms  No   Resting HR  85 bpm   Resting BP  122/64   Resting Oxygen Saturation   99 %   Exercise Oxygen Saturation  during 6 min walk  100 %   Max Ex. HR  110 bpm   Max Ex. BP  130/74   2 Minute Post BP  130/70          Oxygen Initial Assessment:   Oxygen Re-Evaluation:   Oxygen Discharge (Final Oxygen Re-Evaluation):   Initial Exercise Prescription: Initial Exercise Prescription - 08/21/18 1100    Date of Initial Exercise RX and Referring Provider          Date  08/21/18    Referring Provider  Dr. [name]    Expected Discharge Date  11/26/18        NuStep          Level  2    SPM  75    Minutes  10    METs  2        Arm Ergometer          Level  1    Watts  30    Minutes  10    METs  2.75        Track          Laps  7    Minutes  10    METs  2        Prescription Details          Frequency (times per week)  3    Duration  Progress to 30 minutes of continuous aerobic without signs/symptoms of physical distress        Intensity          THRR 40-80% of Max Heartrate   58-116    Ratings of Perceived Exertion  11-13        Progression          Progression  Continue to progress workloads to maintain intensity without signs/symptoms of physical distress.        Resistance Training          Training Prescription  Yes    Weight  2 lbs.     Reps  10-15           Perform Capillary Blood Glucose checks as needed.  Exercise Prescription Changes: Exercise Prescription Changes    Response to Exercise    Row Name 08/29/18 1500 09/03/18 1500 09/10/18 1500 09/25/18 1400   Blood Pressure (Admit)  118/60  120/60  no documentation  124/70   Blood Pressure (Exercise)  160/78  128/62  no documentation  120/60   Blood Pressure (Exit)  112/58  122/60  no documentation  102/60  Heart Rate (Admit)  71 bpm  96 bpm  no documentation  80 bpm   Heart Rate (Exercise)  107 bpm  104 bpm  no documentation  84 bpm   Heart Rate (Exit)  68 bpm  96 bpm  no documentation  64 bpm   Rating of Perceived Exertion (Exercise)  13  13  no documentation  17   Symptoms  no documentation  None  no documentation  None   Comments  t first day of exercise.   no documentation  no documentation  no documentation   Duration  Progress to 30 minutes of  aerobic without signs/symptoms of physical distress  Progress to 30 minutes of  aerobic without signs/symptoms of physical distress  no documentation  Progress to 30 minutes of  aerobic without signs/symptoms of physical distress   Intensity  THRR unchanged  THRR unchanged  no documentation  THRR unchanged       Progression    Row Name 08/29/18 1500 09/03/18 1500 09/10/18 1500 09/25/18 1400   Progression  Continue to progress workloads to maintain intensity without signs/symptoms of physical distress.  Continue to progress workloads to maintain intensity without signs/symptoms of physical distress.  no documentation  Continue to progress workloads to maintain intensity without signs/symptoms of physical distress.   Average METs  2.3  2  no  documentation  2.3       Resistance Training    Row Name 08/29/18 1500 09/03/18 1500 09/10/18 1500 09/25/18 1400   Training Prescription  No  Yes  no documentation  Yes   Weight  no documentation  4 lbs.  no documentation  4 lbs.   Reps  no documentation  10-15  no documentation  10-15   Time  no documentation  10 Minutes  no documentation  10 Minutes       Interval Training    Row Name 08/29/18 1500 09/03/18 1500 09/10/18 1500 09/25/18 1400   Interval Training  no documentation  No  no documentation  No       NuStep    Row Name 08/29/18 1500 09/03/18 1500 09/10/18 1500 09/25/18 1400   Level  2  2  no documentation  3   SPM  75  75  no documentation  75   Minutes  10  10  no documentation  10   METs  2  1.8  no documentation  2       Arm Ergometer    Row Name 08/29/18 1500 09/03/18 1500 09/10/18 1500 09/25/18 1400   Level  no documentation  1  no documentation  1   Watts  no documentation  10  no documentation  14   Minutes  no documentation  10  no documentation  10   METs  no documentation  2.75  no documentation  2.65       Track    Row Name 08/29/18 1500 09/03/18 1500 09/10/18 1500 09/25/18 1400   Laps  13  9  no documentation  8   Minutes  10  10  no documentation  10   METs  2.69  2.22  no documentation  2.22       Home Exercise Plan    [place] Name 08/29/18 1500 09/03/18 1500 09/10/18 1500 09/25/18 1400   Plans to continue exercise at  no documentation  no documentation  Home (comment)  no documentation   Frequency  no documentation  no documentation  Add 2 additional days  to program exercise sessions.  no documentation   Initial Home Exercises Provided  no documentation  no documentation  09/10/18  no documentation          Exercise Comments: Exercise Comments    Row Name 08/29/18 1525 09/06/18 1044 09/10/18 1508 10/02/18 1121   Exercise Comments  Pt first day of cardiac rehab. Tolerated exercise well.   Reviewed METs and goals with Pt. Pt is tolerating  exercise well.   Reviewed HEP with Pt. Pt understands goals.   Reviewed METs and goals with Pt. Pt is tolerating exercise well.       Exercise Goals and Review: Exercise Goals    Exercise Goals    Row Name 08/21/18 0940   Increase Physical Activity  Yes   Intervention  Provide advice, education, support and counseling about physical activity/exercise needs.;Develop an individualized exercise prescription for aerobic and resistive training based on initial evaluation findings, risk stratification, comorbidities and participant's personal goals.   Expected Outcomes  Short Term: Attend rehab on a regular basis to increase amount of physical activity.;Long Term: Add in home exercise to make exercise part of routine and to increase amount of physical activity.;Long Term: Exercising regularly at least 3-5 days a week.   Increase Strength and Stamina  Yes   Intervention  Provide advice, education, support and counseling about physical activity/exercise needs.;Develop an individualized exercise prescription for aerobic and resistive training based on initial evaluation findings, risk stratification, comorbidities and participant's personal goals.   Expected Outcomes  Short Term: Increase workloads from initial exercise prescription for resistance, speed, and METs.;Short Term: Perform resistance training exercises routinely during rehab and add in resistance training at home;Long Term: Improve cardiorespiratory fitness, muscular endurance and strength as measured by increased METs and functional capacity (6MWT)   Able to understand and use rate of perceived exertion (RPE) scale  Yes   Intervention  Provide education and explanation on how to use RPE scale   Expected Outcomes  Short Term: Able to use RPE daily in rehab to express subjective intensity level;Long Term:  Able to use RPE to guide intensity level when exercising independently   Knowledge and understanding of Target Heart Rate Range (THRR)  Yes    Intervention  Provide education and explanation of THRR including how the numbers were predicted and where they are located for reference   Expected Outcomes  Short Term: Able to state/look up THRR;Long Term: Able to use THRR to govern intensity when exercising independently;Short Term: Able to use daily as guideline for intensity in rehab   Able to check pulse independently  Yes   Intervention  Provide education and demonstration on how to check pulse in carotid and radial arteries.;Review the importance of being able to check your own pulse for safety during independent exercise   Expected Outcomes  Short Term: Able to explain why pulse checking is important during independent exercise;Long Term: Able to check pulse independently and accurately   Understanding of Exercise Prescription  Yes   Intervention  Provide education, explanation, and written materials on patient's individual exercise prescription   Expected Outcomes  Short Term: Able to explain program exercise prescription;Long Term: Able to explain home exercise prescription to exercise independently          Exercise Goals Re-Evaluation : Exercise Goals Re-Evaluation    Exercise Goal Re-Evaluation    Row Name 08/29/18 1524 09/06/18 1042 09/10/18 1505 10/02/18 1119   Exercise Goals Review  Increase Physical Activity;Increase Strength and Stamina;Understanding of Exercise  Prescription;Knowledge and understanding of Target Heart Rate Range (THRR);Able to understand and use rate of perceived exertion (RPE) scale  Increase Physical Activity;Increase Strength and Stamina;Able to understand and use rate of perceived exertion (RPE) scale;Knowledge and understanding of Target Heart Rate Range (THRR);Understanding of Exercise Prescription;Able to check pulse independently  Increase Physical Activity;Increase Strength and Stamina;Able to understand and use rate of perceived exertion (RPE) scale;Knowledge and understanding of Target Heart Rate Range  (THRR);Understanding of Exercise Prescription;Able to check pulse independently  Increase Physical Activity;Increase Strength and Stamina;Able to understand and use rate of perceived exertion (RPE) scale;Knowledge and understanding of Target Heart Rate Range (THRR);Understanding of Exercise Prescription;Able to check pulse independently   Comments  Pt first day of the program. Pt tolerated exercise well. Understood RPE scale, THRR, and prescription.   Reviewed METs and goals with Pt. Pt has a MET level of 2.0. Pt is not currently exercising at home. Encouraged Pt to walk 2-3 days per week for 30 minutes in addition to the program.   Reviewed HEP with Pt. Pt was responsive and understand goal, THRR, RPE scale, weather precautions, NTG use, and end points of exercise. Pt is not currently exercising at home. Encouraged Pt to walk 2-3 days per week for 30-45 minutes in addition to Cardiac Rehab.    Reviewed METs and goals with Pt. Pt MET level is 2.3 and is tolerating exercise well. Pt is not currently exercising at home. Encouraged Pt to start walking 2-4 days a week for 30 minutes in addition to [employer].    Expected Outcomes  Will continue to monitor and progress Pt as tolerated.   Will continue to monitor and progress Pt as tolerated.   Will continue to monitor and progress Pt as tolerated.   Will continue to monitor and progress Pt as tolerated.           Discharge Exercise Prescription (Final Exercise Prescription Changes): Exercise Prescription Changes - 09/25/18 1400    Response to Exercise          Blood Pressure (Admit)  124/70    Blood Pressure (Exercise)  120/60    Blood Pressure (Exit)  102/60    Heart Rate (Admit)  80 bpm    Heart Rate (Exercise)  84 bpm    Heart Rate (Exit)  64 bpm    Rating of Perceived Exertion (Exercise)  17    Symptoms  None    Duration  Progress to 30 minutes of  aerobic without signs/symptoms of physical distress    Intensity  THRR unchanged         Progression          Progression  Continue to progress workloads to maintain intensity without signs/symptoms of physical distress.    Average METs  2.3        Resistance Training          Training Prescription  Yes    Weight  4 lbs.    Reps  10-15    Time  10 Minutes        Interval Training          Interval Training  No        NuStep          Level  3    SPM  75    Minutes  10    METs  2        Arm Ergometer          Level  1  Watts  14    Minutes  10    METs  2.65        Track          Laps  8    Minutes  10    METs  2.22           Nutrition:  Target Goals: Understanding of nutrition guidelines, daily intake of sodium <1518m, cholesterol <2028m calories 30% from fat and 7% or less from saturated fats, daily to have 5 or more servings of fruits and vegetables.  Biometrics: Pre Biometrics - 08/21/18 1123    Pre Biometrics          Height  5' 6.75" (1.695 m)    Weight  93.4 kg    Waist Circumference  43.25 inches    Hip Circumference  42.5 inches    Waist to Hip Ratio  1.02 %    BMI (Calculated)  32.51    Triceps Skinfold  21 mm    % Body Fat  32.7 %    Grip Strength  18.5 kg    Flexibility  0 in    Single Leg Stand  0.56 seconds            Nutrition Therapy Plan and Nutrition Goals: Nutrition Therapy & Goals - 08/21/18 1557    Nutrition Therapy          Diet  Heart healthy, carb modified        Personal Nutrition Goals          Nutrition Goal  Pt to identify and limit food sources of saturated fat, trans fat, refined carbohydrates and sodium    Personal Goal #2  Pt to identify food quantities necessary to achieve weight loss of 6-24 lbs. at graduation from cardiac rehab        [place] regarding individualized specific dietary modifications aiming towards targeted core components such as weight, hypertension, lipid management, diabetes, heart failure and other  comorbidities.    Expected Outcomes  Short Term Goal: Understand basic principles of dietary content, such as calories, fat, sodium, cholesterol and nutrients.;Long Term Goal: Adherence to prescribed nutrition plan.           Nutrition Assessments: Nutrition Assessments - 08/21/18 1559    Rate Your Plate Scores          Pre Score  26           Nutrition Goals Re-Evaluation: Nutrition Goals Re-Evaluation    Goals    Row Name 08/21/18 1557   Current Weight  205 lb 14.6 oz (93.4 kg)          Nutrition Goals Re-Evaluation: Nutrition Goals Re-Evaluation    Goals    Row Name 08/21/18 1557   Current Weight  205 lb 14.6 oz (93.4 kg)          Nutrition Goals Discharge (Final Nutrition Goals Re-Evaluation): Nutrition Goals Re-Evaluation - 08/21/18 1557    Goals          Current Weight  205 lb 14.6 oz (93.4 kg)           Psychosocial: Target Goals: Acknowledge presence or absence of significant depression and/or stress, maximize coping skills, provide positive support system. Participant is able to verbalize types and ability to use techniques and skills needed for reducing stress and depression.  Initial Review & Psychosocial Screening: Initial Psych Review &  Screening - 08/21/18 1200    Initial Review          Current issues with  None Identified;Current Stress Concerns    Source of Stress Concerns  Chronic Illness        Family Dynamics          Good Support System?  Yes   spouse, family        Barriers          Psychosocial barriers to participate in program  There are no identifiable barriers or psychosocial needs.        Screening Interventions          Interventions  Encouraged to exercise           Quality of Life Scores: Quality of Life - 09/14/18 1535    Quality of Life Scores          Health/Function Pre  19 %   pt c/o continue DOE and back pain with walking. pt somewhat interested in rollator for home use.    Socioeconomic Pre  23.5  %    Psych/Spiritual Pre  23.5 %    Family Pre  27.6 %    GLOBAL Pre  22.2 %   overall scores reflective of pt inability to perform previous activities.          Scores of 19 and below usually indicate a poorer quality of life in these areas.  A difference of  2-3 points is a clinically meaningful difference.  A difference of 2-3 points in the total score of the Quality of Life Index has been associated with significant improvement in overall quality of life, self-image, physical symptoms, and general health in studies assessing change in quality of life.  PHQ-9: Recent Review Flowsheet Data    Depression screen [facility] 2/9 08/29/2018 08/20/2018 05/21/2018 10/17/2017 08/04/2017   Decreased Interest 0 0 0 0 0   Down, Depressed, Hopeless 0 0 0 0 0   PHQ - 2 Score 0 0 0 0 0     Interpretation of Total Score  Total Score Depression Severity:  1-4 = Minimal depression, 5-9 = Mild depression, 10-14 = Moderate depression, 15-19 = Moderately severe depression, 20-27 = Severe depression   Psychosocial Evaluation and Intervention: Psychosocial Evaluation - 08/29/18 1535    Psychosocial Evaluation & Interventions          Interventions  Encouraged to exercise with the program and follow exercise prescription    Comments  no psychosocial needs identified, no interventions necessary. pt enjoys gardening and looking forward to spring.      Expected Outcomes  pt will exhibit positive outlook with good coping skills.      Continue Psychosocial Services   No Follow up required           Psychosocial Re-Evaluation: Psychosocial Re-Evaluation    Psychosocial Re-Evaluation    [place] Name 09/06/18 0845 10/02/18 1354   Current issues with  None Identified  None Identified   Comments  no psychosocial needs identified, no interventions necessary   no psychosocial needs identified, no interventions necessary    Expected Outcomes  pt will exhibit positive outlook with good coping skills.   pt will exhibit  positive outlook with good coping skills.    Interventions  Encouraged to attend Cardiac Rehabilitation for the exercise  Encouraged to attend Cardiac Rehabilitation for the exercise   Continue Psychosocial Services   No Follow up required  No Follow up required  Psychosocial Discharge (Final Psychosocial Re-Evaluation): Psychosocial Re-Evaluation - 10/02/18 1354    Psychosocial Re-Evaluation          Current issues with  None Identified    Comments  no psychosocial needs identified, no interventions necessary     Expected Outcomes  pt will exhibit positive outlook with good coping skills.     Interventions  Encouraged to attend Cardiac Rehabilitation for the exercise    Continue Psychosocial Services   No Follow up required           Vocational Rehabilitation: Provide vocational rehab assistance to qualifying candidates.   Vocational Rehab Evaluation & Intervention: Vocational Rehab - 08/21/18 1234    Initial Vocational Rehab Evaluation & Intervention          Assessment shows need for Vocational Rehabilitation  --   incomplete assessment, no included in [facility] packet           Education: Education Goals: Education classes will be provided on a weekly basis, covering required topics. Participant will state understanding/return demonstration of topics presented.  Learning Barriers/Preferences: Learning Barriers/Preferences - 08/21/18 1125    Learning Barriers/Preferences          Learning Barriers  Hearing   dementia   Learning Preferences  Written Material           Education Topics: Count Your Pulse:  -Group instruction provided by verbal instruction, demonstration, patient participation and written materials to support subject.  Instructors address importance of being able to find your pulse and how to count your pulse when at home without a heart monitor.  Patients get hands on experience counting their pulse with staff help and individually.   Heart  Attack, Angina, and Risk Factor Modification:  -Group instruction provided by verbal instruction, video, and written materials to support subject.  Instructors address signs and symptoms of angina and heart attacks.    Also discuss risk factors for heart disease and how to make changes to improve heart health risk factors.   Functional Fitness:  -Group instruction provided by verbal instruction, demonstration, patient participation, and written materials to support subject.  Instructors address safety measures for doing things around the house.  Discuss how to get up and down off the floor, how to pick things up properly, how to safely get out of a chair without assistance, and balance training.   Meditation and Mindfulness:  -Group instruction provided by verbal instruction, patient participation, and written materials to support subject.  Instructor addresses importance of mindfulness and meditation practice to help reduce stress and improve awareness.  Instructor also leads participants through a meditation exercise.  Flowsheet Row CARDIAC REHAB PHASE II EXERCISE from 10/03/2018 in [place]  Date  10/03/18  Educator  Mable Fill  Instruction Review Code  2- Demonstrated Understanding      Stretching for Flexibility and Mobility:  -Group instruction provided by verbal instruction, patient participation, and written materials to support subject.  Instructors lead participants through series of stretches that are designed to increase flexibility thus improving mobility.  These stretches are additional exercise for major muscle groups that are typically performed during regular warm up and cool down.   Hands Only CPR:  -Group verbal, video, and participation provides a basic overview of AHA guidelines for community CPR. Role-play of emergencies allow participants the opportunity to practice calling for help and chest compression technique with discussion of AED  use.   Hypertension: -Group verbal and written instruction that provides a  basic overview of hypertension including the most recent diagnostic guidelines, risk factor reduction with self-care instructions and medication management.    Nutrition I class: Heart Healthy Eating:  -Group instruction provided by PowerPoint slides, verbal discussion, and written materials to support subject matter. The instructor gives an explanation and review of the Therapeutic Lifestyle Changes diet recommendations, which includes a discussion on lipid goals, dietary fat, sodium, fiber, plant stanol/sterol esters, sugar, and the components of a well-balanced, healthy diet.   Nutrition II class: Lifestyle Skills:  -Group instruction provided by PowerPoint slides, verbal discussion, and written materials to support subject matter. The instructor gives an explanation and review of label reading, grocery shopping for heart health, heart healthy recipe modifications, and ways to make healthier choices when eating out.   Diabetes Question & Answer:  -Group instruction provided by PowerPoint slides, verbal discussion, and written materials to support subject matter. The instructor gives an explanation and review of diabetes co-morbidities, pre- and post-prandial blood glucose goals, pre-exercise blood glucose goals, signs, symptoms, and treatment of hypoglycemia and hyperglycemia, and foot care basics.   Diabetes Blitz:  -Group instruction provided by PowerPoint slides, verbal discussion, and written materials to support subject matter. The instructor gives an explanation and review of the physiology behind type 1 and type 2 diabetes, diabetes medications and rational behind using different medications, pre- and post-prandial blood glucose recommendations and Hemoglobin A1c goals, diabetes diet, and exercise including blood glucose guidelines for exercising safely.    Portion Distortion:  -Group instruction provided by  PowerPoint slides, verbal discussion, written materials, and food models to support subject matter. The instructor gives an explanation of serving size versus portion size, changes in portions sizes over the last 20 years, and what consists of a serving from each food group.   Stress Management:  -Group instruction provided by verbal instruction, video, and written materials to support subject matter.  Instructors review role of stress in heart disease and how to cope with stress positively.   Flowsheet Row CARDIAC REHAB PHASE II EXERCISE from 10/03/2018 in [place]  Date  08/29/18  Educator  RN  Instruction Review Code  2- Demonstrated Understanding      Exercising on Your Own:  -Group instruction provided by verbal instruction, power point, and written materials to support subject.  Instructors discuss benefits of exercise, components of exercise, frequency and intensity of exercise, and end points for exercise.  Also discuss use of nitroglycerin and activating EMS.  Review options of places to exercise outside of rehab.  Review guidelines for sex with heart disease. Flowsheet Row CARDIAC REHAB PHASE II EXERCISE from 10/03/2018 in [place]  Date  09/26/18  Instruction Review Code  2- Demonstrated Understanding      Cardiac Drugs I:  -Group instruction provided by verbal instruction and written materials to support subject.  Instructor reviews cardiac drug classes: antiplatelets, anticoagulants, beta blockers, and statins.  Instructor discusses reasons, side effects, and lifestyle considerations for each drug class.   Cardiac Drugs II:  -Group instruction provided by verbal instruction and written materials to support subject.  Instructor reviews cardiac drug classes: angiotensin converting enzyme inhibitors (ACE-I), angiotensin II receptor blockers (ARBs), nitrates, and calcium channel blockers.  Instructor discusses reasons,  side effects, and lifestyle considerations for each drug class. Flowsheet Row CARDIAC REHAB PHASE II EXERCISE from 10/03/2018 in [place]  Date  09/12/18  Instruction Review Code  2- Demonstrated Understanding  Anatomy and Physiology of the Circulatory System:  Group verbal and written instruction and models provide basic cardiac anatomy and physiology, with the coronary electrical and arterial systems. Review of: AMI, Angina, Valve disease, Heart Failure, Peripheral Artery Disease, Cardiac Arrhythmia, Pacemakers, and the ICD.   Other Education:  -Group or individual verbal, written, or video instructions that support the educational goals of the cardiac rehab program.   Holiday Eating Survival Tips:  -Group instruction provided by PowerPoint slides, verbal discussion, and written materials to support subject matter. The instructor gives patients tips, tricks, and techniques to help them not only survive but enjoy the holidays despite the onslaught of food that accompanies the holidays.   Knowledge Questionnaire Score: Knowledge Questionnaire Score - 08/21/18 1127    Knowledge Questionnaire Score          Pre Score  23/24           Core Components/Risk Factors/Patient Goals at Admission: Personal Goals and Risk Factors at Admission - 08/21/18 1127    Core Components/Risk Factors/Patient Goals on Admission           Weight Management  Yes;Obesity;Weight Maintenance;Weight Loss    Intervention  Weight Management: Develop a combined nutrition and exercise program designed to reach desired caloric intake, while maintaining appropriate intake of nutrient and fiber, sodium and fats, and appropriate energy expenditure required for the weight goal.;Weight Management: Provide education and appropriate resources to help participant work on and attain dietary goals.;Weight Management/Obesity: Establish reasonable short term and long term weight  goals.;Obesity: Provide education and appropriate resources to help participant work on and attain dietary goals.    Admit Weight  205 lb 14.6 oz (93.4 kg)    Expected Outcomes  Short Term: Continue to assess and modify interventions until short term weight is achieved;Long Term: Adherence to nutrition and physical activity/exercise program aimed toward attainment of established weight goal;Weight Maintenance: Understanding of the daily nutrition guidelines, which includes 25-35% calories from fat, 7% or less cal from saturated fats, less than 269m cholesterol, less than 1.5gm of sodium, & 5 or more servings of fruits and vegetables daily;Weight Loss: Understanding of general recommendations for a balanced deficit meal plan, which promotes 1-2 lb weight loss per week and includes a negative energy balance of [phone] kcal/d;Understanding recommendations for meals to include 15-35% energy as protein, 25-35% energy from fat, 35-60% energy from carbohydrates, less than 2033mof dietary cholesterol, 20-35 gm of total fiber daily;Understanding of distribution of calorie intake throughout the day with the consumption of 4-5 meals/snacks    Diabetes  Yes    Intervention  Provide education about signs/symptoms and action to take for hypo/hyperglycemia.;Provide education about proper nutrition, including hydration, and aerobic/resistive exercise prescription along with prescribed medications to achieve blood glucose in normal ranges: Fasting glucose 65-99 mg/dL    Expected Outcomes  Short Term: Participant verbalizes understanding of the signs/symptoms and immediate care of hyper/hypoglycemia, proper foot care and importance of medication, aerobic/resistive exercise and nutrition plan for blood glucose control.;Long Term: Attainment of HbA1C < 7%.    Hypertension  Yes    Intervention  Provide education on lifestyle modifcations including regular physical activity/exercise, weight management, moderate sodium  restriction and increased consumption of fresh fruit, vegetables, and low fat dairy, alcohol moderation, and smoking cessation.;Monitor prescription use compliance.    Expected Outcomes  Long Term: Maintenance of blood pressure at goal levels.;Short Term: Continued assessment and intervention until BP is < 140/9058mG in hypertensive participants. < 130/24m35m in  hypertensive participants with diabetes, heart failure or chronic kidney disease.    Lipids  Yes    Intervention  Provide education and support for participant on nutrition & aerobic/resistive exercise along with prescribed medications to achieve LDL <35m, HDL >467m    Expected Outcomes  Short Term: Participant states understanding of desired cholesterol values and is compliant with medications prescribed. Participant is following exercise prescription and nutrition guidelines.;Long Term: Cholesterol controlled with medications as prescribed, with individualized exercise RX and with personalized nutrition plan. Value goals: LDL < 7025mHDL > 40 mg.    Stress  Yes    Intervention  Offer individual and/or small group education and counseling on adjustment to heart disease, stress management and health-related lifestyle change. Teach and support self-help strategies.;Refer participants experiencing significant psychosocial distress to appropriate mental health specialists for further evaluation and treatment. When possible, include family members and significant others in education/counseling sessions.    Expected Outcomes  Short Term: Participant demonstrates changes in health-related behavior, relaxation and other stress management skills, ability to obtain effective social support, and compliance with psychotropic medications if prescribed.;Long Term: Emotional wellbeing is indicated by absence of clinically significant psychosocial distress or social isolation.           Core Components/Risk Factors/Patient Goals Review:  Goals and Risk  Factor Review    Core Components/Risk Factors/Patient Goals Review    Row Name 08/29/18 1542 09/06/18 0846 10/02/18 1355   Personal Goals Review  Weight Management/Obesity;Diabetes;Hypertension;Lipids;Stress  Weight Management/Obesity;Diabetes;Hypertension;Lipids;Stress  Weight Management/Obesity;Diabetes;Hypertension;Lipids;Stress   Review  pt with multiple CAD RF demonstrates eagerness to participate in CR opportunities. pt personal goals are weight loss, increased strength/stamina and improved posture. pt interested in getting rollator for home use.  pt interested in building core strength.    pt with multiple CAD RF demonstrates eagerness to participate in CR opportunities. pt personal goals are weight loss, increased strength/stamina and improved posture. pt interested in getting rollator for home use.  pt interested in building core strength.    pt with multiple CAD RF demonstrates eagerness to participate in CR opportunities. pt personal goals are weight loss, increased strength/stamina and improved posture. pt interested in getting rollator for home use.  pt interested in building core strength. pt admites walking continues to be difficult, limited by back pain.  pt atrial flutter currenlty treated with amiodarone 100m4mily.      Expected Outcomes  pt will participate in CR exercise, nutrition and lifestyle modification to decrease overall RF.    pt will participate in CR exercise, nutrition and lifestyle modification to decrease overall RF.    pt will participate in CR exercise, nutrition and lifestyle modification to decrease overall RF.            Core Components/Risk Factors/Patient Goals at Discharge (Final Review):  Goals and Risk Factor Review - 10/02/18 1355    Core Components/Risk Factors/Patient Goals Review          Personal Goals Review  Weight Management/Obesity;Diabetes;Hypertension;Lipids;Stress    Review  pt with multiple CAD RF demonstrates eagerness to participate in CR  opportunities. pt personal goals are weight loss, increased strength/stamina and improved posture. pt interested in getting rollator for home use.  pt interested in building core strength. pt admites walking continues to be difficult, limited by back pain.  pt atrial flutter currenlty treated with amiodarone 100mg80mly.       Expected Outcomes  pt will participate in CR exercise, nutrition and lifestyle modification to decrease overall RF.  ITP Comments: ITP Comments    Row Name 08/21/18 0934 08/29/18 1445 09/06/18 0843 10/02/18 1354   ITP Comments  Medical Director- Dr. Fransico Him, MD  pt started group exercise. pt tolerated light activity without difficulty. pt oriented to exercise equipment and safety routine.  understanding verbalized.   30 day ITP review.  pt demonstrates willingness to participate in CR program.   30 day ITP review.  pt demonstrates willingness to participate in CR program.       Comments:

## 2018-10-05 ENCOUNTER — Telehealth: Payer: Self-pay | Admitting: Oncology

## 2018-10-05 ENCOUNTER — Encounter (HOSPITAL_COMMUNITY)
Admission: RE | Admit: 2018-10-05 | Discharge: 2018-10-05 | Disposition: A | Discharge status: Home / Self Care | Payer: Medicare Other | Source: Ambulatory Visit | Attending: Cardiology | Admitting: Cardiology

## 2018-10-05 ENCOUNTER — Inpatient Hospital Stay: Payer: Medicare Other | Attending: Oncology | Admitting: Oncology

## 2018-10-05 ENCOUNTER — Ambulatory Visit (HOSPITAL_COMMUNITY): Payer: Medicare Other

## 2018-10-05 VITALS — BP 126/60 | HR 57 | Temp 98.0°F | Resp 17 | Ht 68.0 in | Wt 212.7 lb

## 2018-10-05 DIAGNOSIS — I4892 Unspecified atrial flutter: Secondary | ICD-10-CM | POA: Diagnosis not present

## 2018-10-05 DIAGNOSIS — N529 Male erectile dysfunction, unspecified: Secondary | ICD-10-CM

## 2018-10-05 DIAGNOSIS — Z7901 Long term (current) use of anticoagulants: Secondary | ICD-10-CM | POA: Diagnosis not present

## 2018-10-05 DIAGNOSIS — Z951 Presence of aortocoronary bypass graft: Secondary | ICD-10-CM

## 2018-10-05 DIAGNOSIS — Z923 Personal history of irradiation: Secondary | ICD-10-CM | POA: Insufficient documentation

## 2018-10-05 DIAGNOSIS — Z79818 Long term (current) use of other agents affecting estrogen receptors and estrogen levels: Secondary | ICD-10-CM

## 2018-10-05 DIAGNOSIS — Z79899 Other long term (current) drug therapy: Secondary | ICD-10-CM | POA: Insufficient documentation

## 2018-10-05 DIAGNOSIS — Z794 Long term (current) use of insulin: Secondary | ICD-10-CM | POA: Insufficient documentation

## 2018-10-05 DIAGNOSIS — D638 Anemia in other chronic diseases classified elsewhere: Secondary | ICD-10-CM | POA: Insufficient documentation

## 2018-10-05 DIAGNOSIS — Z952 Presence of prosthetic heart valve: Secondary | ICD-10-CM | POA: Diagnosis not present

## 2018-10-05 DIAGNOSIS — I4891 Unspecified atrial fibrillation: Secondary | ICD-10-CM | POA: Diagnosis not present

## 2018-10-05 DIAGNOSIS — C61 Malignant neoplasm of prostate: Secondary | ICD-10-CM | POA: Diagnosis not present

## 2018-10-05 DIAGNOSIS — R5383 Other fatigue: Secondary | ICD-10-CM | POA: Insufficient documentation

## 2018-10-05 LAB — GLUCOSE, CAPILLARY: Glucose-Capillary: 125 mg/dL — ABNORMAL HIGH (ref 70–99)

## 2018-10-05 NOTE — Progress Notes (Signed)
Hematology and Oncology Follow Up Visit  Cody Phelps [id number] [birth year] 78 y.o. 10/05/2018 10:29 AM [name], MDSouth, [name], MD   Principle Diagnosis: 78 year old man with prostate cancer diagnosed in 2018.  He presented with a Gleason score of 4+4 = 8 and a PSA 4.4.  He had localized disease without any evidence of metastasis.   Prior Therapy:  Definitive radiation therapy utilizing IMRT between August 15, 2017 and September 18, 2017.  He also received brachytherapy boost upfront on January 4 of 2019.  He received 45 Gy in 25 fraction to supplement his post implant of 110 Gy.  Current therapy: Androgen deprivation therapy started in 2018 to complete 2 years of therapy.  He is currently receiving it under the care of Dr. Junious Silk.  Interim History: Cody Phelps returns today for a follow-up visit.  He is a pleasant 78 year old man with history of prostate cancer I saw in consultation in 2018 at the time of diagnosis.  Since his prostate cancer diagnosis he completed radiation therapy and currently undergoing androgen deprivation therapy.  He also underwent cardiac surgery on October 30 of 2020.  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  He continues to have issues with atrial fibrillation and currently on amiodarone and had cardioversion.  He follows with Dr. [name] regarding this issue.  Clinically, he does report some mild fatigue and dyspnea on exertion mostly related cardiac issues.  He does participate in cardiac rehab and able to drive.  He denies any bone pain or pathological fractures.  He does not report any headaches, blurry vision, syncope or seizures. Does not report any fevers, chills or sweats.  Does not report any cough, wheezing or hemoptysis.  Does not report any chest pain or leg edema.  Does not report any nausea, vomiting or abdominal pain.  Does not report any constipation or diarrhea.  Does not report any skeletal complaints.    Does not report  frequency, urgency or hematuria.  Does not report any skin rashes or lesions. Does not report any heat or cold intolerance.  Does not report any lymphadenopathy or petechiae.  Does not report any anxiety or depression.  Remaining review of systems is negative.    Medications: I have reviewed the patient's current medications.  Current Outpatient Medications  Medication Sig Dispense Refill  . acetaminophen (TYLENOL) 325 MG tablet Take 1 tablet (325 mg total) by mouth every 4 (four) hours as needed (headache, pain). 60 tablet 3  . amiodarone (PACERONE) 200 MG tablet Take 1 tablet (200 mg total) by mouth daily. 90 tablet 1  . atorvastatin (LIPITOR) 80 MG tablet Take 1 tablet (80 mg total) by mouth daily at 6 PM. (Patient taking differently: Take 80 mg by mouth every evening. ) 60 tablet 3  . colesevelam (WELCHOL) 625 MG tablet Take 625 mg by mouth 3 (three) times daily.     Marland Kitchen donepezil (ARICEPT) 10 MG tablet Take 10 mg by mouth every morning.     Marland Kitchen esomeprazole (NEXIUM) 40 MG capsule Take 40 mg by mouth daily.     . ferrous sulfate 325 (65 FE) MG tablet Take 1 tablet (325 mg total) by mouth daily with breakfast. 90 tablet 2  . insulin glargine (LANTUS SOLOSTAR) 100 UNIT/ML injection Inject 20-44 Units into the skin at bedtime as needed (for BGL >150).     . memantine (NAMENDA) 5 MG tablet Take 5 mg by mouth 2 (two) times daily.    . metoprolol tartrate (  LOPRESSOR) 25 MG tablet Take 0.5 tablets (12.5 mg total) by mouth 2 (two) times daily. 60 tablet 3  . mirabegron ER (MYRBETRIQ) 50 MG TB24 tablet Take 50 mg by mouth every evening.     Marland Kitchen omega-3 acid ethyl esters (LOVAZA) 1 G capsule Take 2 g by mouth daily.     . ONE TOUCH ULTRA TEST test strip 1 strip by Other route daily.    . potassium chloride SA (K-DUR,KLOR-CON) 20 MEQ tablet Take 1 tablet (20 mEq total) by mouth daily with breakfast. (Patient not taking: Reported on 09/21/2018) 30 tablet 1  . Rivaroxaban (XARELTO) 15 MG TABS tablet Take 15 mg  by mouth daily.     Marland Kitchen rOPINIRole (REQUIP) 2 MG tablet Take 2 mg by mouth 2 (two) times daily. 1600 & at bedtime    . spironolactone (ALDACTONE) 25 MG tablet Take 0.5 tablets (12.5 mg total) by mouth daily. 30 tablet 3  . tamsulosin (FLOMAX) 0.4 MG CAPS capsule Take 0.4 mg by mouth daily after supper.     . torsemide (DEMADEX) 20 MG tablet Take 1 tablet (20 mg total) by mouth daily. (Patient not taking: Reported on 09/21/2018) 30 tablet 1  . valsartan-hydrochlorothiazide (DIOVAN-HCT) 80-12.5 MG tablet Take 1 tablet by mouth daily.     No current facility-administered medications for this visit.      Allergies: No Known Allergies  Past Medical History, Surgical history, Social history, and Family History were reviewed and updated.    Physical Exam: Blood pressure 126/60, pulse (!) 57, temperature 98 F (36.7 C), temperature source Oral, resp. rate 17, height 5\' 8"  (1.727 m), weight 212 lb 11.2 oz (96.5 kg), SpO2 99 %.   ECOG: 1 General appearance: alert and cooperative appeared without distress. Head: Normocephalic, without obvious abnormality Oropharynx: No oral thrush or ulcers. Eyes: No scleral icterus.  Pupils are equal and round reactive to light. Lymph nodes: Cervical, supraclavicular, and axillary nodes normal. Heart: Irregular without any murmurs.  No leg edema noted. Lung:chest clear, no wheezing, rales, normal symmetric air entry Abdomin: soft, non-tender, without masses or organomegaly. Neurological: No motor, sensory deficits.  Intact deep tendon reflexes. Skin: No rashes or lesions.  No ecchymosis or petechiae. Musculoskeletal: No joint deformity or effusion. Psychiatric: Mood and affect are appropriate.    Lab Results: Lab Results  Component Value Date   WBC 7.7 06/02/2018   HGB 8.4 (L) 06/02/2018   HCT 26.1 (L) 06/02/2018   MCV 89.1 06/02/2018   PLT 94 (L) 06/02/2018     Chemistry      Component Value Date/Time   NA 136 06/02/2018 0724   K 4.0 06/02/2018  0724   CL 110 06/02/2018 0724   CO2 21 (L) 06/02/2018 0724   BUN 19 06/02/2018 0724   CREATININE 0.90 06/02/2018 0724      Component Value Date/Time   CALCIUM 8.0 (L) 06/02/2018 0724   ALKPHOS 47 05/28/2018 1345   AST 18 05/28/2018 1345   ALT 23 05/28/2018 1345   BILITOT 0.4 05/28/2018 1345          Impression and Plan:  78 year old man with the following:  1.  Prostate cancer diagnosed in 2018.  He had a Gleason score 4+4 = 8, PSA 4 and received definitive therapy with radiation as outlined above.  He is currently receiving androgen deprivation therapy to complete 2 years in the care of Dr. Junious Silk.  The natural course of this disease was discussed today with the patient in detail.  Risk of relapse was also assessed.  At this time, I recommended continuing therapy as he is receiving and complete 2 years of androgen deprivation.  After that continued surveillance will be needed and he will continue to do that under the care of Dr. Junious Silk.  Different salvage therapies may be needed in the future including Zytiga, Xtandi or systemic chemotherapy if he develops disease relapse in the future.  2.  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  He does not require any intervention at this time or transfusion.  Hemoglobin is above 10.  3.  Atrial flutter: Continues to follow with Dr. [name] and currently on amiodarone and Xarelto.  4.  Erectile dysfunction: He had questions and concerns regarding this issue.  Androgen deprivation therapy is likely a big culprit especially in the setting of recent cardiac surgery.  He understands that this issue may be long-term especially with androgen deprivation therapy.  However, after stopping androgen deprivation his testosterone will likely recover and could help with his dysfunction.  5.  Follow-up: He is currently following with Dr. Junious Silk for his prostate cancer as well as  his cardiologist and his primary care physician.  He has multiple laboratory testing done periodically and does not require any additional testing at this time.  I am happy to be involved in his care as needed in the future.  He knows to call at anytime if there is any questions or concerns arise.  25  minutes was spent with the patient face-to-face today.  More than 50% of time was dedicated to reviewing his disease status, treatment options, managing complications related to therapy.   Zola Button, MD 3/6/202010:29 AM

## 2018-10-05 NOTE — Telephone Encounter (Signed)
No los °

## 2018-10-08 ENCOUNTER — Encounter (HOSPITAL_COMMUNITY)
Admission: RE | Admit: 2018-10-08 | Discharge: 2018-10-08 | Disposition: A | Discharge status: Home / Self Care | Payer: Medicare Other | Source: Ambulatory Visit | Attending: Cardiology | Admitting: Cardiology

## 2018-10-08 ENCOUNTER — Ambulatory Visit (HOSPITAL_COMMUNITY): Payer: Medicare Other

## 2018-10-08 DIAGNOSIS — Z952 Presence of prosthetic heart valve: Secondary | ICD-10-CM

## 2018-10-08 DIAGNOSIS — Z951 Presence of aortocoronary bypass graft: Secondary | ICD-10-CM

## 2018-10-10 ENCOUNTER — Other Ambulatory Visit: Payer: Self-pay

## 2018-10-10 ENCOUNTER — Ambulatory Visit (HOSPITAL_COMMUNITY): Payer: Medicare Other

## 2018-10-10 ENCOUNTER — Encounter (HOSPITAL_COMMUNITY)
Admission: RE | Admit: 2018-10-10 | Discharge: 2018-10-10 | Disposition: A | Discharge status: Home / Self Care | Payer: Medicare Other | Source: Ambulatory Visit | Attending: Cardiology | Admitting: Cardiology

## 2018-10-10 DIAGNOSIS — Z952 Presence of prosthetic heart valve: Secondary | ICD-10-CM

## 2018-10-10 DIAGNOSIS — Z951 Presence of aortocoronary bypass graft: Secondary | ICD-10-CM

## 2018-10-12 ENCOUNTER — Ambulatory Visit (HOSPITAL_COMMUNITY): Payer: Medicare Other

## 2018-10-12 ENCOUNTER — Telehealth: Payer: Self-pay

## 2018-10-12 ENCOUNTER — Encounter (HOSPITAL_COMMUNITY): Payer: Medicare Other

## 2018-10-12 NOTE — Telephone Encounter (Signed)
Pt's wife called stating that when pt was last in office they were advised to call office if pt was out of rhythm for 10 days and she states that he is. Please advise.//ah

## 2018-10-12 NOTE — Telephone Encounter (Signed)
Please set up cardioversion

## 2018-10-14 ENCOUNTER — Other Ambulatory Visit: Payer: Self-pay | Admitting: Cardiology

## 2018-10-15 ENCOUNTER — Ambulatory Visit (HOSPITAL_COMMUNITY): Payer: Medicare Other

## 2018-10-15 ENCOUNTER — Encounter (HOSPITAL_COMMUNITY): Payer: Medicare Other

## 2018-10-15 ENCOUNTER — Telehealth (HOSPITAL_COMMUNITY): Payer: Self-pay | Admitting: Cardiac Rehabilitation

## 2018-10-15 ENCOUNTER — Telehealth (HOSPITAL_COMMUNITY): Payer: Self-pay | Admitting: Endocrinology

## 2018-10-15 NOTE — Telephone Encounter (Signed)
Phone call to pt to inform of CR Phase II departmental closing for 2 weeks.  Pt wife verbalized understanding.  Andi Hence, RN, BSN Cardiac Pulmonary Rehab

## 2018-10-16 NOTE — Telephone Encounter (Signed)
Left message on machine for patient to call back re: the cardioversion

## 2018-10-16 NOTE — Telephone Encounter (Signed)
Patient's wife called me back & with all the COVID-19 issues, they really don't want him to do the cardioversion right now.  They just want to wait & see how he feels, etc re: the afib.

## 2018-10-17 ENCOUNTER — Encounter (HOSPITAL_COMMUNITY): Payer: Medicare Other

## 2018-10-17 ENCOUNTER — Encounter: Payer: Self-pay | Admitting: *Deleted

## 2018-10-17 ENCOUNTER — Ambulatory Visit (HOSPITAL_COMMUNITY): Payer: Medicare Other

## 2018-10-17 NOTE — Telephone Encounter (Signed)
I am fine either ways, hospital is taking all precautions and it is OP, but I understand. A. Fib is not life threatening and if he gets better, we could also just leave it as A. Fib and patients get used to it

## 2018-10-19 ENCOUNTER — Ambulatory Visit (HOSPITAL_COMMUNITY): Payer: Medicare Other

## 2018-10-19 ENCOUNTER — Encounter (HOSPITAL_COMMUNITY): Payer: Medicare Other

## 2018-10-22 ENCOUNTER — Encounter (HOSPITAL_COMMUNITY): Payer: Medicare Other

## 2018-10-22 ENCOUNTER — Ambulatory Visit (HOSPITAL_COMMUNITY): Payer: Medicare Other

## 2018-10-23 ENCOUNTER — Telehealth (HOSPITAL_COMMUNITY): Payer: Self-pay | Admitting: Cardiac Rehabilitation

## 2018-10-23 ENCOUNTER — Encounter (HOSPITAL_COMMUNITY): Payer: Self-pay | Admitting: Cardiac Rehabilitation

## 2018-10-23 DIAGNOSIS — Z952 Presence of prosthetic heart valve: Secondary | ICD-10-CM

## 2018-10-23 DIAGNOSIS — Z951 Presence of aortocoronary bypass graft: Secondary | ICD-10-CM

## 2018-10-23 NOTE — Telephone Encounter (Signed)
Phone call to pt to advise of outpatient cardiac rehab departmental closing additional 30 days for COVID 19 precautions.  Pt verbalized understanding.  Joann Rion, RN, BSN Cardiac Pulmonary Rehab 

## 2018-10-23 NOTE — Progress Notes (Signed)
Cardiac Individual Treatment Plan  Patient Details  Name: Cody Phelps MRN: [id number] Date of Birth: 01-10-1941 Referring Provider:   Flowsheet Row CARDIAC REHAB PHASE II ORIENTATION from 08/21/2018 in [place]  Referring Provider  Dr. [name]      Initial Encounter Date:  Flowsheet Row CARDIAC REHAB PHASE II ORIENTATION from 08/21/2018 in [place]  Date  08/21/18      Visit Diagnosis: 05/30/18 AVR  05/30/18 CABG x1  Patient's Home Medications on Admission:  Current Outpatient Medications:  .  acetaminophen (TYLENOL) 325 MG tablet, Take 1 tablet (325 mg total) by mouth every 4 (four) hours as needed (headache, pain)., Disp: 60 tablet, Rfl: 3 .  amiodarone (PACERONE) 200 MG tablet, Take 1 tablet (200 mg total) by mouth daily., Disp: 90 tablet, Rfl: 1 .  atorvastatin (LIPITOR) 80 MG tablet, Take 1 tablet (80 mg total) by mouth daily at 6 PM. (Patient taking differently: Take 80 mg by mouth every evening. ), Disp: 60 tablet, Rfl: 3 .  colesevelam (WELCHOL) 625 MG tablet, Take 625 mg by mouth 3 (three) times daily. , Disp: , Rfl:  .  donepezil (ARICEPT) 10 MG tablet, Take 10 mg by mouth every morning. , Disp: , Rfl:  .  esomeprazole (NEXIUM) 40 MG capsule, Take 40 mg by mouth daily. , Disp: , Rfl:  .  ferrous sulfate 325 (65 FE) MG tablet, Take 1 tablet (325 mg total) by mouth daily with breakfast., Disp: 90 tablet, Rfl: 2 .  insulin glargine (LANTUS SOLOSTAR) 100 UNIT/ML injection, Inject 20-44 Units into the skin at bedtime as needed (for BGL >150). , Disp: , Rfl:  .  memantine (NAMENDA) 5 MG tablet, Take 5 mg by mouth 2 (two) times daily., Disp: , Rfl:  .  metoprolol tartrate (LOPRESSOR) 25 MG tablet, TAKE 1 TABLET BY MOUTH  DAILY, Disp: 90 tablet, Rfl: 3 .  mirabegron ER (MYRBETRIQ) 50 MG TB24 tablet, Take 50 mg by mouth every evening. , Disp: , Rfl:  .  omega-3 acid ethyl esters (LOVAZA) 1 G capsule, Take 2 g by mouth  daily. , Disp: , Rfl:  .  ONE TOUCH ULTRA TEST test strip, 1 strip by Other route daily., Disp: , Rfl:  .  potassium chloride SA (K-DUR,KLOR-CON) 20 MEQ tablet, Take 1 tablet (20 mEq total) by mouth daily with breakfast. (Patient not taking: Reported on 09/21/2018), Disp: 30 tablet, Rfl: 1 .  Rivaroxaban (XARELTO) 15 MG TABS tablet, Take 15 mg by mouth daily. , Disp: , Rfl:  .  rOPINIRole (REQUIP) 2 MG tablet, Take 2 mg by mouth 2 (two) times daily. 1600 & at bedtime, Disp: , Rfl:  .  spironolactone (ALDACTONE) 25 MG tablet, Take 0.5 tablets (12.5 mg total) by mouth daily., Disp: 30 tablet, Rfl: 3 .  tamsulosin (FLOMAX) 0.4 MG CAPS capsule, Take 0.4 mg by mouth daily after supper. , Disp: , Rfl:  .  torsemide (DEMADEX) 20 MG tablet, Take 1 tablet (20 mg total) by mouth daily. (Patient not taking: Reported on 09/21/2018), Disp: 30 tablet, Rfl: 1 .  valsartan-hydrochlorothiazide (DIOVAN-HCT) 80-12.5 MG tablet, Take 1 tablet by mouth daily., Disp: , Rfl:   Past Medical History: Past Medical History:  Diagnosis Date  . Acute anterior wall MI ([place]) 03/01/2018  . Acute combined systolic and diastolic heart failure ([place]) 03/15/2018  . Bronchitis, acute 05/04/2012  . Carotid stenosis, left followed by dr [name], cardiologist--- bilateral bruit per note  per dr [name] note [id number]  LICA 12-24% stenosis per duplex 05-14-2014  . Chronic diastolic CHF (congestive heart failure) ([place]) 09/21/2018  . Diverticulosis   . DM (diabetes mellitus) type 2, uncontrolled, with ketoacidosis ([place]) 11/25/2013  . ED (erectile dysfunction)   . First degree heart block   . GERD (gastroesophageal reflux disease)   . Heart murmur   . Hypertension    cardiologsit-  dr [name]  . Iron deficiency anemia   . Mixed hyperlipidemia   . Morbid obesity ([place])   . Myocardial infarction ([place])    02/28/18  . OA (osteoarthritis)    right hip  . OSA on CPAP    followed by dr dohmeier, uses CPAP  . PAF (paroxysmal atrial fibrillation)  ([name])    a. on Eliquis  . Prostate cancer ([place])    dx 03-17-2017 (bx)  Stage T2a, Gleason 4+4, PSA  4.43, vol 32.32--  s/p radiatctive prostate seed implants 07-10-2017 then IMRT and ADT  . RLS (restless legs syndrome)   . S/P aortic valve replacement with bioprosthetic valve 05/30/2018   23 mm Edwards Inspiris Resilia stented bovine pericardial tissue valve  . 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 S/P Maze operation for atrial fibrillation 05/30/2018   Complete bilateral atrial lesion set using bipolar radiofrequency and cryothermy ablation with clipping of LA appendage  . Scoliosis   . Severe aortic stenosis   . Trigger finger   . Type 2 diabetes mellitus ([place])   . Wears partial dentures    upper and lower    Tobacco Use: Social History   Tobacco Use  Smoking Status Former Smoker  . Years: 1.00  . Types: Cigarettes  . Last attempt to quit: 02/04/1967  . Years since quitting: 51.7  Smokeless Tobacco Never Used  Tobacco Comment   smoked 1 q2-3 days    Labs: Recent Review Flowsheet Data    Labs for ITP Cardiac and Pulmonary Rehab Latest Ref Rng & Units 05/30/2018 05/30/2018 05/30/2018 05/31/2018 06/01/2018   Cholestrol 0 - 200 mg/dL - - - - -   LDLCALC 0 - 99 mg/dL - - - - -   HDL >40 mg/dL - - - - -   Trlycerides <150 mg/dL - - - - -   Hemoglobin A1c 4.8 - 5.6 % - - - - -   PHART 7.350 - 7.450 7.336(L) 7.325(L) - - -   PCO2ART 32.0 - 48.0 mmHg 42.8 42.7 - - -   HCO3 20.0 - 28.0 mmol/L 22.7 22.1 - - -   TCO2 22 - 32 mmol/L 24 23 23  - -   ACIDBASEDEF 0.0 - 2.0 mmol/L 3.0(H) 4.0(H) - - -   O2SAT % 99.0 97.0 - 55.7 75.4      Capillary Blood Glucose: Lab Results  Component Value Date   GLUCAP 125 (H) 10/05/2018   GLUCAP 110 (H) 09/21/2018   GLUCAP 117 (H) 09/17/2018   GLUCAP 90 09/14/2018   GLUCAP 85 09/12/2018     Exercise Target Goals: Exercise Program Goal: Individual exercise prescription set using results from initial 6 min walk test and THRR  while considering  patient's activity barriers and safety.   Exercise Prescription Goal: Initial exercise prescription builds to 30-45 minutes a day of aerobic activity, 2-3 days per week.  Home exercise guidelines will be given to patient during program as part of exercise prescription that the participant will acknowledge.  Activity Barriers & Risk Stratification: Activity Barriers &  Cardiac Risk Stratification - 08/21/18 0937    Activity Barriers & Cardiac Risk Stratification          Activity Barriers  Right Hip Replacement;Other (comment)    Comments  Previous back and ankle surgery    Cardiac Risk Stratification  High           6 Minute Walk: 6 Minute Walk    6 Minute Walk    Row Name 08/21/18 1122   Phase  Initial   Distance  1069 feet   Walk Time  6 minutes   # of Rest Breaks  0   MPH  2.02   METS  2.02   RPE  13   Symptoms  No   Resting HR  85 bpm   Resting BP  122/64   Resting Oxygen Saturation   99 %   Exercise Oxygen Saturation  during 6 min walk  100 %   Max Ex. HR  110 bpm   Max Ex. BP  130/74   2 Minute Post BP  130/70          Oxygen Initial Assessment:   Oxygen Re-Evaluation:   Oxygen Discharge (Final Oxygen Re-Evaluation):   Initial Exercise Prescription: Initial Exercise Prescription - 08/21/18 1100    Date of Initial Exercise RX and Referring Provider          Date  08/21/18    Referring Provider  Dr. [name]    Expected Discharge Date  11/26/18        NuStep          Level  2    SPM  75    Minutes  10    METs  2        Arm Ergometer          Level  1    Watts  30    Minutes  10    METs  2.75        Track          Laps  7    Minutes  10    METs  2        Prescription Details          Frequency (times per week)  3    Duration  Progress to 30 minutes of continuous aerobic without signs/symptoms of physical distress        Intensity          THRR 40-80% of Max Heartrate  58-116    Ratings of Perceived Exertion   11-13        Progression          Progression  Continue to progress workloads to maintain intensity without signs/symptoms of physical distress.        Resistance Training          Training Prescription  Yes    Weight  2 lbs.     Reps  10-15           Perform Capillary Blood Glucose checks as needed.  Exercise Prescription Changes: Exercise Prescription Changes    Response to Exercise    Row Name 08/29/18 1500 09/03/18 1500 09/10/18 1500 09/25/18 1400 10/10/18 0902   Blood Pressure (Admit)  118/60  120/60  no documentation  124/70  114/58   Blood Pressure (Exercise)  160/78  128/62  no documentation  120/60  110/68   Blood Pressure (Exit)  112/58  122/60  no documentation  102/60  132/70  Heart Rate (Admit)  71 bpm  96 bpm  no documentation  80 bpm  68 bpm   Heart Rate (Exercise)  107 bpm  104 bpm  no documentation  84 bpm  78 bpm   Heart Rate (Exit)  68 bpm  96 bpm  no documentation  64 bpm  58 bpm   Rating of Perceived Exertion (Exercise)  13  13  no documentation  17  13   Symptoms  no documentation  None  no documentation  None  None   Comments  t first day of exercise.   no documentation  no documentation  no documentation  no documentation   Duration  Progress to 30 minutes of  aerobic without signs/symptoms of physical distress  Progress to 30 minutes of  aerobic without signs/symptoms of physical distress  no documentation  Progress to 30 minutes of  aerobic without signs/symptoms of physical distress  Progress to 30 minutes of  aerobic without signs/symptoms of physical distress   Intensity  THRR unchanged  THRR unchanged  no documentation  THRR unchanged  THRR unchanged       Progression    Row Name 08/29/18 1500 09/03/18 1500 09/10/18 1500 09/25/18 1400 10/10/18 0902   Progression  Continue to progress workloads to maintain intensity without signs/symptoms of physical distress.  Continue to progress workloads to maintain intensity without signs/symptoms of physical  distress.  no documentation  Continue to progress workloads to maintain intensity without signs/symptoms of physical distress.  Continue to progress workloads to maintain intensity without signs/symptoms of physical distress.   Average METs  2.3  2  no documentation  2.3  2.3       Resistance Training    Row Name 08/29/18 1500 09/03/18 1500 09/10/18 1500 09/25/18 1400 10/10/18 0902   Training Prescription  No  Yes  no documentation  Yes  No   Weight  no documentation  4 lbs.  no documentation  4 lbs.  no documentation   Reps  no documentation  10-15  no documentation  10-15  no documentation   Time  no documentation  10 Minutes  no documentation  10 Minutes  no documentation       Interval Training    Row Name 08/29/18 1500 09/03/18 1500 09/10/18 1500 09/25/18 1400 10/10/18 0902   Interval Training  no documentation  No  no documentation  No  No       NuStep    Row Name 08/29/18 1500 09/03/18 1500 09/10/18 1500 09/25/18 1400 10/10/18 0902   Level  2  2  no documentation  3  3   SPM  75  75  no documentation  75  75   Minutes  10  10  no documentation  10  10   METs  2  1.8  no documentation  2  2       Arm Ergometer    Row Name 08/29/18 1500 09/03/18 1500 09/10/18 1500 09/25/18 1400 10/10/18 0902   Level  no documentation  1  no documentation  1  1   Watts  no documentation  10  no documentation  14  11   Minutes  no documentation  10  no documentation  10  10   METs  no documentation  2.75  no documentation  2.65  2.65       Track    Row Name 08/29/18 1500 09/03/18 1500 09/10/18 1500 09/25/18 1400 10/10/18 0902   Laps  13  9  no documentation  8  4   Minutes  10  10  no documentation  10  10   METs  2.69  2.22  no documentation  2.22  2.22       Home Exercise Plan    Row Name 08/29/18 1500 09/03/18 1500 09/10/18 1500 09/25/18 1400 10/10/18 0902   Plans to continue exercise at  no documentation  no documentation  Home (comment)  no documentation  Home (comment)   Frequency   no documentation  no documentation  Add 2 additional days to program exercise sessions.  no documentation  Add 2 additional days to program exercise sessions.   Initial Home Exercises Provided  no documentation  no documentation  09/10/18  no documentation  09/10/18          Exercise Comments: Exercise Comments    Row Name 08/29/18 1525 09/06/18 1044 09/10/18 1508 10/02/18 1121 10/19/18 0904   Exercise Comments  Pt first day of cardiac rehab. Tolerated exercise well.   Reviewed METs and goals with Pt. Pt is tolerating exercise well.   Reviewed HEP with Pt. Pt understands goals.   Reviewed METs and goals with Pt. Pt is tolerating exercise well.   Unable to review METs and goals with Pt due to closure of Cardiac Rehab for [employer].       Exercise Goals and Review: Exercise Goals    Exercise Goals    Row Name 08/21/18 0940   Increase Physical Activity  Yes   Intervention  Provide advice, education, support and counseling about physical activity/exercise needs.;Develop an individualized exercise prescription for aerobic and resistive training based on initial evaluation findings, risk stratification, comorbidities and participant's personal goals.   Expected Outcomes  Short Term: Attend rehab on a regular basis to increase amount of physical activity.;Long Term: Add in home exercise to make exercise part of routine and to increase amount of physical activity.;Long Term: Exercising regularly at least 3-5 days a week.   Increase Strength and Stamina  Yes   Intervention  Provide advice, education, support and counseling about physical activity/exercise needs.;Develop an individualized exercise prescription for aerobic and resistive training based on initial evaluation findings, risk stratification, comorbidities and participant's personal goals.   Expected Outcomes  Short Term: Increase workloads from initial exercise prescription for resistance, speed, and METs.;Short Term: Perform resistance  training exercises routinely during rehab and add in resistance training at home;Long Term: Improve cardiorespiratory fitness, muscular endurance and strength as measured by increased METs and functional capacity (6MWT)   Able to understand and use rate of perceived exertion (RPE) scale  Yes   Intervention  Provide education and explanation on how to use RPE scale   Expected Outcomes  Short Term: Able to use RPE daily in rehab to express subjective intensity level;Long Term:  Able to use RPE to guide intensity level when exercising independently   Knowledge and understanding of Target Heart Rate Range (THRR)  Yes   Intervention  Provide education and explanation of THRR including how the numbers were predicted and where they are located for reference   Expected Outcomes  Short Term: Able to state/look up THRR;Long Term: Able to use THRR to govern intensity when exercising independently;Short Term: Able to use daily as guideline for intensity in rehab   Able to check pulse independently  Yes   Intervention  Provide education and demonstration on how to check pulse in carotid and radial arteries.;Review the importance of being able to check  your own pulse for safety during independent exercise   Expected Outcomes  Short Term: Able to explain why pulse checking is important during independent exercise;Long Term: Able to check pulse independently and accurately   Understanding of Exercise Prescription  Yes   Intervention  Provide education, explanation, and written materials on patient's individual exercise prescription   Expected Outcomes  Short Term: Able to explain program exercise prescription;Long Term: Able to explain home exercise prescription to exercise independently          Exercise Goals Re-Evaluation : Exercise Goals Re-Evaluation    Exercise Goal Re-Evaluation    Row Name 08/29/18 1524 09/06/18 1042 09/10/18 1505 10/02/18 1119 10/19/18 0904   Exercise Goals Review  Increase Physical  Activity;Increase Strength and Stamina;Understanding of Exercise Prescription;Knowledge and understanding of Target Heart Rate Range (THRR);Able to understand and use rate of perceived exertion (RPE) scale  Increase Physical Activity;Increase Strength and Stamina;Able to understand and use rate of perceived exertion (RPE) scale;Knowledge and understanding of Target Heart Rate Range (THRR);Understanding of Exercise Prescription;Able to check pulse independently  Increase Physical Activity;Increase Strength and Stamina;Able to understand and use rate of perceived exertion (RPE) scale;Knowledge and understanding of Target Heart Rate Range (THRR);Understanding of Exercise Prescription;Able to check pulse independently  Increase Physical Activity;Increase Strength and Stamina;Able to understand and use rate of perceived exertion (RPE) scale;Knowledge and understanding of Target Heart Rate Range (THRR);Understanding of Exercise Prescription;Able to check pulse independently  Increase Physical Activity;Increase Strength and Stamina;Able to understand and use rate of perceived exertion (RPE) scale;Knowledge and understanding of Target Heart Rate Range (THRR);Understanding of Exercise Prescription;Able to check pulse independently   Comments  Pt first day of the program. Pt tolerated exercise well. Understood RPE scale, THRR, and prescription.   Reviewed METs and goals with Pt. Pt has a MET level of 2.0. Pt is not currently exercising at home. Encouraged Pt to walk 2-3 days per week for 30 minutes in addition to the program.   Reviewed HEP with Pt. Pt was responsive and understand goal, THRR, RPE scale, weather precautions, NTG use, and end points of exercise. Pt is not currently exercising at home. Encouraged Pt to walk 2-3 days per week for 30-45 minutes in addition to Cardiac Rehab.    Reviewed METs and goals with Pt. Pt MET level is 2.3 and is tolerating exercise well. Pt is not currently exercising at home. Encouraged  Pt to start walking 2-4 days a week for 30 minutes in addition to [employer].   Unable to review METs and goals with Pt due to closure of Cardiac Rehab for [facility]. Pt MET level is 2.3. Pt is progressing well.    Expected Outcomes  Will continue to monitor and progress Pt as tolerated.   Will continue to monitor and progress Pt as tolerated.   Will continue to monitor and progress Pt as tolerated.   Will continue to monitor and progress Pt as tolerated.   Will continue to monitor and progress Pt as tolerated.           Discharge Exercise Prescription (Final Exercise Prescription Changes): Exercise Prescription Changes - 10/10/18 0902    Response to Exercise          Blood Pressure (Admit)  114/58    Blood Pressure (Exercise)  110/68    Blood Pressure (Exit)  132/70    Heart Rate (Admit)  68 bpm    Heart Rate (Exercise)  78 bpm    Heart Rate (Exit)  58 bpm  Rating of Perceived Exertion (Exercise)  13    Symptoms  None    Duration  Progress to 30 minutes of  aerobic without signs/symptoms of physical distress    Intensity  THRR unchanged        Progression          Progression  Continue to progress workloads to maintain intensity without signs/symptoms of physical distress.    Average METs  2.3        Resistance Training          Training Prescription  No        Interval Training          Interval Training  No        NuStep          Level  3    SPM  75    Minutes  10    METs  2        Arm Ergometer          Level  1    Watts  11    Minutes  10    METs  2.65        Track          Laps  4    Minutes  10    METs  2.22        Home Exercise Plan          Plans to continue exercise at  Home (comment)    Frequency  Add 2 additional days to program exercise sessions.    Initial Home Exercises Provided  09/10/18           Nutrition:  Target Goals: Understanding of nutrition guidelines, daily intake of sodium <1524m, cholesterol <209m calories 30%  from fat and 7% or less from saturated fats, daily to have 5 or more servings of fruits and vegetables.  Biometrics: Pre Biometrics - 08/21/18 1123    Pre Biometrics          Height  5' 6.75" (1.695 m)    Weight  205 lb 14.6 oz (93.4 kg)    Waist Circumference  43.25 inches    Hip Circumference  42.5 inches    Waist to Hip Ratio  1.02 %    BMI (Calculated)  32.51    Triceps Skinfold  21 mm    % Body Fat  32.7 %    Grip Strength  18.5 kg    Flexibility  0 in    Single Leg Stand  0.56 seconds            Nutrition Therapy Plan and Nutrition Goals: Nutrition Therapy & Goals - 08/21/18 1557    Nutrition Therapy          Diet  Heart healthy, carb modified        Personal Nutrition Goals          Nutrition Goal  Pt to identify and limit food sources of saturated fat, trans fat, refined carbohydrates and sodium    Personal Goal #2  Pt to identify food quantities necessary to achieve weight loss of 6-24 lbs. at graduation from cardiac rehab        [place] and counsel regarding individualized specific dietary modifications aiming towards targeted core components such as weight, hypertension, lipid management, diabetes, heart failure and other comorbidities.    Expected Outcomes  Short Term Goal: Understand  basic principles of dietary content, such as calories, fat, sodium, cholesterol and nutrients.;Long Term Goal: Adherence to prescribed nutrition plan.           Nutrition Assessments: Nutrition Assessments - 08/21/18 1559    Rate Your Plate Scores          Pre Score  26           Nutrition Goals Re-Evaluation: Nutrition Goals Re-Evaluation    Goals    Row Name 08/21/18 1557   Current Weight  205 lb 14.6 oz (93.4 kg)          Nutrition Goals Re-Evaluation: Nutrition Goals Re-Evaluation    Goals    Row Name 08/21/18 1557   Current Weight  205 lb 14.6 oz (93.4 kg)          Nutrition Goals Discharge  (Final Nutrition Goals Re-Evaluation): Nutrition Goals Re-Evaluation - 08/21/18 1557    Goals          Current Weight  205 lb 14.6 oz (93.4 kg)           Psychosocial: Target Goals: Acknowledge presence or absence of significant depression and/or stress, maximize coping skills, provide positive support system. Participant is able to verbalize types and ability to use techniques and skills needed for reducing stress and depression.  Initial Review & Psychosocial Screening: Initial Psych Review & Screening - 08/21/18 1200    Initial Review          Current issues with  None Identified;Current Stress Concerns    Source of Stress Concerns  Chronic Illness        Family Dynamics          Good Support System?  Yes   spouse, family        Barriers          Psychosocial barriers to participate in program  There are no identifiable barriers or psychosocial needs.        Screening Interventions          Interventions  Encouraged to exercise           Quality of Life Scores: Quality of Life - 09/14/18 1535    Quality of Life Scores          Health/Function Pre  19 %   pt c/o continue DOE and back pain with walking. pt somewhat interested in rollator for home use.    Socioeconomic Pre  23.5 %    Psych/Spiritual Pre  23.5 %    Family Pre  27.6 %    GLOBAL Pre  22.2 %   overall scores reflective of pt inability to perform previous activities.          Scores of 19 and below usually indicate a poorer quality of life in these areas.  A difference of  2-3 points is a clinically meaningful difference.  A difference of 2-3 points in the total score of the Quality of Life Index has been associated with significant improvement in overall quality of life, self-image, physical symptoms, and general health in studies assessing change in quality of life.  PHQ-9: Recent Review Flowsheet Data    Depression screen [facility] 2/9 08/29/2018 08/20/2018 05/21/2018 10/17/2017 08/04/2017   Decreased  Interest 0 0 0 0 0   Down, Depressed, Hopeless 0 0 0 0 0   PHQ - 2 Score 0 0 0 0 0     Interpretation of Total Score  Total Score Depression Severity:  1-4 = Minimal  depression, 5-9 = Mild depression, 10-14 = Moderate depression, 15-19 = Moderately severe depression, 20-27 = Severe depression   Psychosocial Evaluation and Intervention: Psychosocial Evaluation - 08/29/18 1535    Psychosocial Evaluation & Interventions          Interventions  Encouraged to exercise with the program and follow exercise prescription    Comments  no psychosocial needs identified, no interventions necessary. pt enjoys gardening and looking forward to spring.      Expected Outcomes  pt will exhibit positive outlook with good coping skills.      Continue Psychosocial Services   No Follow up required           Psychosocial Re-Evaluation: Psychosocial Re-Evaluation    Psychosocial Re-Evaluation    [place] Name 09/06/18 0845 10/02/18 1354 10/17/18 1220   Current issues with  None Identified  None Identified  None Identified   Comments  no psychosocial needs identified, no interventions necessary   no psychosocial needs identified, no interventions necessary   no psychosocial needs identified, no interventions necessary    Expected Outcomes  pt will exhibit positive outlook with good coping skills.   pt will exhibit positive outlook with good coping skills.   pt will exhibit positive outlook with good coping skills.    Interventions  Encouraged to attend Cardiac Rehabilitation for the exercise  Encouraged to attend Cardiac Rehabilitation for the exercise  Encouraged to attend Cardiac Rehabilitation for the exercise   Continue Psychosocial Services   No Follow up required  No Follow up required  No Follow up required          Psychosocial Discharge (Final Psychosocial Re-Evaluation): Psychosocial Re-Evaluation - 10/17/18 1220    Psychosocial Re-Evaluation          Current issues with  None Identified     Comments  no psychosocial needs identified, no interventions necessary     Expected Outcomes  pt will exhibit positive outlook with good coping skills.     Interventions  Encouraged to attend Cardiac Rehabilitation for the exercise    Continue Psychosocial Services   No Follow up required           Vocational Rehabilitation: Provide vocational rehab assistance to qualifying candidates.   Vocational Rehab Evaluation & Intervention: Vocational Rehab - 08/21/18 1234    Initial Vocational Rehab Evaluation & Intervention          Assessment shows need for Vocational Rehabilitation  --   incomplete assessment, no included in [facility] packet           Education: Education Goals: Education classes will be provided on a weekly basis, covering required topics. Participant will state understanding/return demonstration of topics presented.  Learning Barriers/Preferences: Learning Barriers/Preferences - 08/21/18 1125    Learning Barriers/Preferences          Learning Barriers  Hearing   dementia   Learning Preferences  Written Material           Education Topics: Count Your Pulse:  -Group instruction provided by verbal instruction, demonstration, patient participation and written materials to support subject.  Instructors address importance of being able to find your pulse and how to count your pulse when at home without a heart monitor.  Patients get hands on experience counting their pulse with staff help and individually.   Heart Attack, Angina, and Risk Factor Modification:  -Group instruction provided by verbal instruction, video, and written materials to support subject.  Instructors address signs and symptoms of angina and  heart attacks.    Also discuss risk factors for heart disease and how to make changes to improve heart health risk factors.   Functional Fitness:  -Group instruction provided by verbal instruction, demonstration, patient participation, and written materials to  support subject.  Instructors address safety measures for doing things around the house.  Discuss how to get up and down off the floor, how to pick things up properly, how to safely get out of a chair without assistance, and balance training.   Meditation and Mindfulness:  -Group instruction provided by verbal instruction, patient participation, and written materials to support subject.  Instructor addresses importance of mindfulness and meditation practice to help reduce stress and improve awareness.  Instructor also leads participants through a meditation exercise.  Flowsheet Row CARDIAC REHAB PHASE II EXERCISE from 10/03/2018 in [place]  Date  10/03/18  Educator  Mable Fill  Instruction Review Code  2- Demonstrated Understanding      Stretching for Flexibility and Mobility:  -Group instruction provided by verbal instruction, patient participation, and written materials to support subject.  Instructors lead participants through series of stretches that are designed to increase flexibility thus improving mobility.  These stretches are additional exercise for major muscle groups that are typically performed during regular warm up and cool down.   Hands Only CPR:  -Group verbal, video, and participation provides a basic overview of AHA guidelines for community CPR. Role-play of emergencies allow participants the opportunity to practice calling for help and chest compression technique with discussion of AED use.   Hypertension: -Group verbal and written instruction that provides a basic overview of hypertension including the most recent diagnostic guidelines, risk factor reduction with self-care instructions and medication management.    Nutrition I class: Heart Healthy Eating:  -Group instruction provided by PowerPoint slides, verbal discussion, and written materials to support subject matter. The instructor gives an explanation and review of the Therapeutic  Lifestyle Changes diet recommendations, which includes a discussion on lipid goals, dietary fat, sodium, fiber, plant stanol/sterol esters, sugar, and the components of a well-balanced, healthy diet.   Nutrition II class: Lifestyle Skills:  -Group instruction provided by PowerPoint slides, verbal discussion, and written materials to support subject matter. The instructor gives an explanation and review of label reading, grocery shopping for heart health, heart healthy recipe modifications, and ways to make healthier choices when eating out.   Diabetes Question & Answer:  -Group instruction provided by PowerPoint slides, verbal discussion, and written materials to support subject matter. The instructor gives an explanation and review of diabetes co-morbidities, pre- and post-prandial blood glucose goals, pre-exercise blood glucose goals, signs, symptoms, and treatment of hypoglycemia and hyperglycemia, and foot care basics.   Diabetes Blitz:  -Group instruction provided by PowerPoint slides, verbal discussion, and written materials to support subject matter. The instructor gives an explanation and review of the physiology behind type 1 and type 2 diabetes, diabetes medications and rational behind using different medications, pre- and post-prandial blood glucose recommendations and Hemoglobin A1c goals, diabetes diet, and exercise including blood glucose guidelines for exercising safely.    Portion Distortion:  -Group instruction provided by PowerPoint slides, verbal discussion, written materials, and food models to support subject matter. The instructor gives an explanation of serving size versus portion size, changes in portions sizes over the last 20 years, and what consists of a serving from each food group.   Stress Management:  -Group instruction provided by verbal instruction, video, and written materials  to support subject matter.  Instructors review role of stress in heart disease and how  to cope with stress positively.   Flowsheet Row CARDIAC REHAB PHASE II EXERCISE from 10/03/2018 in [place]  Date  08/29/18  Educator  RN  Instruction Review Code  2- Demonstrated Understanding      Exercising on Your Own:  -Group instruction provided by verbal instruction, power point, and written materials to support subject.  Instructors discuss benefits of exercise, components of exercise, frequency and intensity of exercise, and end points for exercise.  Also discuss use of nitroglycerin and activating EMS.  Review options of places to exercise outside of rehab.  Review guidelines for sex with heart disease. Flowsheet Row CARDIAC REHAB PHASE II EXERCISE from 10/03/2018 in [place]  Date  09/26/18  Instruction Review Code  2- Demonstrated Understanding      Cardiac Drugs I:  -Group instruction provided by verbal instruction and written materials to support subject.  Instructor reviews cardiac drug classes: antiplatelets, anticoagulants, beta blockers, and statins.  Instructor discusses reasons, side effects, and lifestyle considerations for each drug class.   Cardiac Drugs II:  -Group instruction provided by verbal instruction and written materials to support subject.  Instructor reviews cardiac drug classes: angiotensin converting enzyme inhibitors (ACE-I), angiotensin II receptor blockers (ARBs), nitrates, and calcium channel blockers.  Instructor discusses reasons, side effects, and lifestyle considerations for each drug class. Flowsheet Row CARDIAC REHAB PHASE II EXERCISE from 10/03/2018 in [place]  Date  09/12/18  Instruction Review Code  2- Demonstrated Understanding      Anatomy and Physiology of the Circulatory System:  Group verbal and written instruction and models provide basic cardiac anatomy and physiology, with the coronary electrical and arterial systems. Review of: AMI,  Angina, Valve disease, Heart Failure, Peripheral Artery Disease, Cardiac Arrhythmia, Pacemakers, and the ICD.   Other Education:  -Group or individual verbal, written, or video instructions that support the educational goals of the cardiac rehab program.   Holiday Eating Survival Tips:  -Group instruction provided by PowerPoint slides, verbal discussion, and written materials to support subject matter. The instructor gives patients tips, tricks, and techniques to help them not only survive but enjoy the holidays despite the onslaught of food that accompanies the holidays.   Knowledge Questionnaire Score: Knowledge Questionnaire Score - 08/21/18 1127    Knowledge Questionnaire Score          Pre Score  23/24           Core Components/Risk Factors/Patient Goals at Admission: Personal Goals and Risk Factors at Admission - 08/21/18 1127    Core Components/Risk Factors/Patient Goals on Admission           Weight Management  Yes;Obesity;Weight Maintenance;Weight Loss    Intervention  Weight Management: Develop a combined nutrition and exercise program designed to reach desired caloric intake, while maintaining appropriate intake of nutrient and fiber, sodium and fats, and appropriate energy expenditure required for the weight goal.;Weight Management: Provide education and appropriate resources to help participant work on and attain dietary goals.;Weight Management/Obesity: Establish reasonable short term and long term weight goals.;Obesity: Provide education and appropriate resources to help participant work on and attain dietary goals.    Admit Weight  205 lb 14.6 oz (93.4 kg)    Expected Outcomes  Short Term: Continue to assess and modify interventions until short term weight is achieved;Long Term: Adherence to nutrition and physical  activity/exercise program aimed toward attainment of established weight goal;Weight Maintenance: Understanding of the daily nutrition guidelines, which includes  25-35% calories from fat, 7% or less cal from saturated fats, less than 210m cholesterol, less than 1.5gm of sodium, & 5 or more servings of fruits and vegetables daily;Weight Loss: Understanding of general recommendations for a balanced deficit meal plan, which promotes 1-2 lb weight loss per week and includes a negative energy balance of [id number] kcal/d;Understanding recommendations for meals to include 15-35% energy as protein, 25-35% energy from fat, 35-60% energy from carbohydrates, less than 2098mof dietary cholesterol, 20-35 gm of total fiber daily;Understanding of distribution of calorie intake throughout the day with the consumption of 4-5 meals/snacks    Diabetes  Yes    Intervention  Provide education about signs/symptoms and action to take for hypo/hyperglycemia.;Provide education about proper nutrition, including hydration, and aerobic/resistive exercise prescription along with prescribed medications to achieve blood glucose in normal ranges: Fasting glucose 65-99 mg/dL    Expected Outcomes  Short Term: Participant verbalizes understanding of the signs/symptoms and immediate care of hyper/hypoglycemia, proper foot care and importance of medication, aerobic/resistive exercise and nutrition plan for blood glucose control.;Long Term: Attainment of HbA1C < 7%.    Hypertension  Yes    Intervention  Provide education on lifestyle modifcations including regular physical activity/exercise, weight management, moderate sodium restriction and increased consumption of fresh fruit, vegetables, and low fat dairy, alcohol moderation, and smoking cessation.;Monitor prescription use compliance.    Expected Outcomes  Long Term: Maintenance of blood pressure at goal levels.;Short Term: Continued assessment and intervention until BP is < 140/90101mG in hypertensive participants. < 130/63m84m in hypertensive participants with diabetes, heart failure or chronic kidney disease.    Lipids  Yes    Intervention   Provide education and support for participant on nutrition & aerobic/resistive exercise along with prescribed medications to achieve LDL <70mg91mL >40mg.93mExpected Outcomes  Short Term: Participant states understanding of desired cholesterol values and is compliant with medications prescribed. Participant is following exercise prescription and nutrition guidelines.;Long Term: Cholesterol controlled with medications as prescribed, with individualized exercise RX and with personalized nutrition plan. Value goals: LDL < 70mg, 65m> 40 mg.    Stress  Yes    Intervention  Offer individual and/or small group education and counseling on adjustment to heart disease, stress management and health-related lifestyle change. Teach and support self-help strategies.;Refer participants experiencing significant psychosocial distress to appropriate mental health specialists for further evaluation and treatment. When possible, include family members and significant others in education/counseling sessions.    Expected Outcomes  Short Term: Participant demonstrates changes in health-related behavior, relaxation and other stress management skills, ability to obtain effective social support, and compliance with psychotropic medications if prescribed.;Long Term: Emotional wellbeing is indicated by absence of clinically significant psychosocial distress or social isolation.           Core Components/Risk Factors/Patient Goals Review:  Goals and Risk Factor Review    Core Components/Risk Factors/Patient Goals Review    Row Name 08/29/18 1542 09/06/18 0846 10/02/18 1355 10/17/18 1220   Personal Goals Review  Weight Management/Obesity;Diabetes;Hypertension;Lipids;Stress  Weight Management/Obesity;Diabetes;Hypertension;Lipids;Stress  Weight Management/Obesity;Diabetes;Hypertension;Lipids;Stress  Weight Management/Obesity;Diabetes;Hypertension;Lipids;Stress   Review  pt with multiple CAD RF demonstrates eagerness to participate  in CR opportunities. pt personal goals are weight loss, increased strength/stamina and improved posture. pt interested in getting rollator for home use.  pt interested in building core strength.    pt with multiple CAD RF  demonstrates eagerness to participate in CR opportunities. pt personal goals are weight loss, increased strength/stamina and improved posture. pt interested in getting rollator for home use.  pt interested in building core strength.    pt with multiple CAD RF demonstrates eagerness to participate in CR opportunities. pt personal goals are weight loss, increased strength/stamina and improved posture. pt interested in getting rollator for home use.  pt interested in building core strength. pt admites walking continues to be difficult, limited by back pain.  pt atrial flutter currenlty treated with amiodarone 131m daily.     pt with multiple CAD RF demonstrates eagerness to participate in CR opportunities. pt personal goals are weight loss, increased strength/stamina and improved posture. pt interested in getting rollator for home use.  pt interested in building core strength. pt admites walking continues to be difficult, limited by back pain.  pt atrial flutter currenlty treated with amiodarone 1035mdaily.    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    Expected Outcomes  pt will participate in CR exercise, nutrition and lifestyle modification to decrease overall RF.    pt will participate in CR exercise, nutrition and lifestyle modification to decrease overall RF.    pt will participate in CR exercise, nutrition and lifestyle modification to decrease overall RF.    pt will participate in CR exercise, nutrition and lifestyle modification to decrease overall RF.            Core Components/Risk Factors/Patient Goals at Discharge (Final Review):  Goals and Risk Factor Review - 10/17/18 1220    Core Components/Risk Factors/Patient Goals Review          Personal  Goals Review  Weight Management/Obesity;Diabetes;Hypertension;Lipids;Stress    Review  pt with multiple CAD RF demonstrates eagerness to participate in CR opportunities. pt personal goals are weight loss, increased strength/stamina and improved posture. pt interested in getting rollator for home use.  pt interested in building core strength. pt admites walking continues to be difficult, limited by back pain.  pt atrial flutter currenlty treated with amiodarone 10037maily.    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     Expected Outcomes  pt will participate in CR exercise, nutrition and lifestyle modification to decrease overall RF.             ITP Comments: ITP Comments    Row Name 08/21/18 0934 08/29/18 1445 09/06/18 0843 10/02/18 1354 10/17/18 1220   ITP Comments  Medical Director- Dr. TraFransico HimD  pt started group exercise. pt tolerated light activity without difficulty. pt oriented to exercise equipment and safety routine.  understanding verbalized.   30 day ITP review.  pt demonstrates willingness to participate in CR program.   30 day ITP review.  pt demonstrates willingness to participate in CR program.   30 day ITP review.  pt demonstrates willingness to participate in CR program.  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       Comments:  JoaAndi HenceN, BSN Cardiac Pulmonary Rehab

## 2018-10-24 ENCOUNTER — Encounter (HOSPITAL_COMMUNITY): Payer: Medicare Other

## 2018-10-24 ENCOUNTER — Ambulatory Visit (HOSPITAL_COMMUNITY): Payer: Medicare Other

## 2018-10-26 ENCOUNTER — Encounter (HOSPITAL_COMMUNITY): Payer: Medicare Other

## 2018-10-26 ENCOUNTER — Ambulatory Visit (HOSPITAL_COMMUNITY): Payer: Medicare Other

## 2018-10-29 ENCOUNTER — Other Ambulatory Visit: Payer: Self-pay | Admitting: Cardiology

## 2018-10-29 ENCOUNTER — Encounter (HOSPITAL_COMMUNITY): Payer: Self-pay

## 2018-10-29 ENCOUNTER — Other Ambulatory Visit: Payer: Self-pay

## 2018-10-29 ENCOUNTER — Ambulatory Visit (HOSPITAL_COMMUNITY): Payer: Medicare Other

## 2018-10-29 ENCOUNTER — Encounter (HOSPITAL_COMMUNITY): Payer: Medicare Other

## 2018-10-29 ENCOUNTER — Emergency Department (HOSPITAL_COMMUNITY)
Admission: EM | Admit: 2018-10-29 | Discharge: 2018-10-29 | Disposition: A | Discharge status: Other Acute Inpatient Hospital | Payer: Medicare Other | Attending: Emergency Medicine | Admitting: Emergency Medicine

## 2018-10-29 DIAGNOSIS — E119 Type 2 diabetes mellitus without complications: Secondary | ICD-10-CM | POA: Insufficient documentation

## 2018-10-29 DIAGNOSIS — T2027XA Burn of second degree of neck, initial encounter: Secondary | ICD-10-CM | POA: Diagnosis not present

## 2018-10-29 DIAGNOSIS — Y9389 Activity, other specified: Secondary | ICD-10-CM | POA: Insufficient documentation

## 2018-10-29 DIAGNOSIS — Z23 Encounter for immunization: Secondary | ICD-10-CM | POA: Insufficient documentation

## 2018-10-29 DIAGNOSIS — Z87891 Personal history of nicotine dependence: Secondary | ICD-10-CM | POA: Insufficient documentation

## 2018-10-29 DIAGNOSIS — X04XXXA Exposure to ignition of highly flammable material, initial encounter: Secondary | ICD-10-CM | POA: Insufficient documentation

## 2018-10-29 DIAGNOSIS — Y999 Unspecified external cause status: Secondary | ICD-10-CM | POA: Insufficient documentation

## 2018-10-29 DIAGNOSIS — T23261A Burn of second degree of back of right hand, initial encounter: Secondary | ICD-10-CM | POA: Diagnosis not present

## 2018-10-29 DIAGNOSIS — I5032 Chronic diastolic (congestive) heart failure: Secondary | ICD-10-CM | POA: Insufficient documentation

## 2018-10-29 DIAGNOSIS — T2006XA Burn of unspecified degree of forehead and cheek, initial encounter: Secondary | ICD-10-CM | POA: Diagnosis present

## 2018-10-29 DIAGNOSIS — T2026XA Burn of second degree of forehead and cheek, initial encounter: Secondary | ICD-10-CM | POA: Diagnosis not present

## 2018-10-29 DIAGNOSIS — T24231A Burn of second degree of right lower leg, initial encounter: Secondary | ICD-10-CM | POA: Insufficient documentation

## 2018-10-29 DIAGNOSIS — Y92007 Garden or yard of unspecified non-institutional (private) residence as the place of occurrence of the external cause: Secondary | ICD-10-CM | POA: Insufficient documentation

## 2018-10-29 DIAGNOSIS — I110 Hypertensive heart disease with heart failure: Secondary | ICD-10-CM | POA: Insufficient documentation

## 2018-10-29 DIAGNOSIS — T300 Burn of unspecified body region, unspecified degree: Secondary | ICD-10-CM

## 2018-10-29 MED ORDER — HYDROMORPHONE HCL 1 MG/ML IJ SOLN
1.0000 mg | Freq: Once | INTRAMUSCULAR | Status: AC
  Administered 2018-10-29: 1 mg via INTRAVENOUS
  Filled 2018-10-29: qty 1

## 2018-10-29 MED ORDER — TETANUS-DIPHTH-ACELL PERTUSSIS 5-2.5-18.5 LF-MCG/0.5 IM SUSP
0.5000 mL | Freq: Once | INTRAMUSCULAR | Status: AC
  Administered 2018-10-29: 0.5 mL via INTRAMUSCULAR
  Filled 2018-10-29: qty 0.5

## 2018-10-29 MED ORDER — LACTATED RINGERS IV SOLN: INTRAVENOUS | Status: DC

## 2018-10-29 NOTE — ED Notes (Signed)
Called [facility] for pt transport to [facility] Per Dr. Ronnald Nian

## 2018-10-29 NOTE — ED Notes (Signed)
Called [facility]
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
## 2018-10-31 ENCOUNTER — Encounter (HOSPITAL_COMMUNITY): Payer: Medicare Other

## 2018-10-31 ENCOUNTER — Ambulatory Visit (HOSPITAL_COMMUNITY): Payer: Medicare Other

## 2018-10-31 MED ORDER — BACITRACIN ZINC 500 UNIT/GM EX OINT
1.00 | TOPICAL_OINTMENT | CUTANEOUS | Status: DC
Start: ? — End: 2018-10-31

## 2018-11-02 ENCOUNTER — Encounter (HOSPITAL_COMMUNITY): Payer: Medicare Other

## 2018-11-02 ENCOUNTER — Ambulatory Visit (HOSPITAL_COMMUNITY): Payer: Medicare Other

## 2018-11-05 ENCOUNTER — Encounter (HOSPITAL_COMMUNITY): Payer: Medicare Other

## 2018-11-05 ENCOUNTER — Ambulatory Visit (HOSPITAL_COMMUNITY): Payer: Medicare Other

## 2018-11-07 ENCOUNTER — Ambulatory Visit (HOSPITAL_COMMUNITY): Payer: Medicare Other

## 2018-11-07 ENCOUNTER — Encounter (HOSPITAL_COMMUNITY): Payer: Medicare Other

## 2018-11-08 ENCOUNTER — Telehealth (HOSPITAL_COMMUNITY): Payer: Self-pay | Admitting: Cardiac Rehabilitation
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
## 2018-11-09 ENCOUNTER — Encounter (HOSPITAL_COMMUNITY): Payer: Medicare Other

## 2018-11-09 ENCOUNTER — Ambulatory Visit (HOSPITAL_COMMUNITY): Payer: Medicare Other

## 2018-11-12 ENCOUNTER — Ambulatory Visit (HOSPITAL_COMMUNITY): Payer: Medicare Other

## 2018-11-12 ENCOUNTER — Encounter (HOSPITAL_COMMUNITY): Payer: Medicare Other

## 2018-11-14 ENCOUNTER — Encounter (HOSPITAL_COMMUNITY): Payer: Medicare Other

## 2018-11-14 ENCOUNTER — Ambulatory Visit (HOSPITAL_COMMUNITY): Payer: Medicare Other

## 2018-11-16 ENCOUNTER — Encounter (HOSPITAL_COMMUNITY): Payer: Medicare Other

## 2018-11-16 ENCOUNTER — Ambulatory Visit (HOSPITAL_COMMUNITY): Payer: Medicare Other

## 2018-11-19 ENCOUNTER — Ambulatory Visit (HOSPITAL_COMMUNITY): Payer: Medicare Other

## 2018-11-19 ENCOUNTER — Encounter (HOSPITAL_COMMUNITY): Payer: Medicare Other

## 2018-11-21 ENCOUNTER — Ambulatory Visit (HOSPITAL_COMMUNITY): Payer: Medicare Other

## 2018-11-21 ENCOUNTER — Encounter (HOSPITAL_COMMUNITY): Payer: Medicare Other

## 2018-11-23 ENCOUNTER — Ambulatory Visit (HOSPITAL_COMMUNITY): Payer: Medicare Other

## 2018-11-23 ENCOUNTER — Encounter (HOSPITAL_COMMUNITY): Payer: Medicare Other

## 2018-11-26 ENCOUNTER — Ambulatory Visit (HOSPITAL_COMMUNITY): Payer: Medicare Other

## 2018-11-26 ENCOUNTER — Encounter (HOSPITAL_COMMUNITY): Payer: Medicare Other

## 2018-11-28 ENCOUNTER — Ambulatory Visit (HOSPITAL_COMMUNITY): Payer: Medicare Other

## 2018-11-28 ENCOUNTER — Encounter (HOSPITAL_COMMUNITY): Payer: Medicare Other

## 2018-11-30 ENCOUNTER — Encounter (HOSPITAL_COMMUNITY): Payer: Medicare Other

## 2018-11-30 ENCOUNTER — Ambulatory Visit (HOSPITAL_COMMUNITY): Payer: Medicare Other

## 2018-12-22 IMAGING — CR DG CHEST 2V
2 series · 2 of 2 positions shown · non-contrast
Comparison: Chest radiograph March 15, 2018 and chest CT March 17, 2018

CLINICAL DATA: Shortness of breath and cough

EXAM:
CHEST - 2 VIEW

[w chest pa]
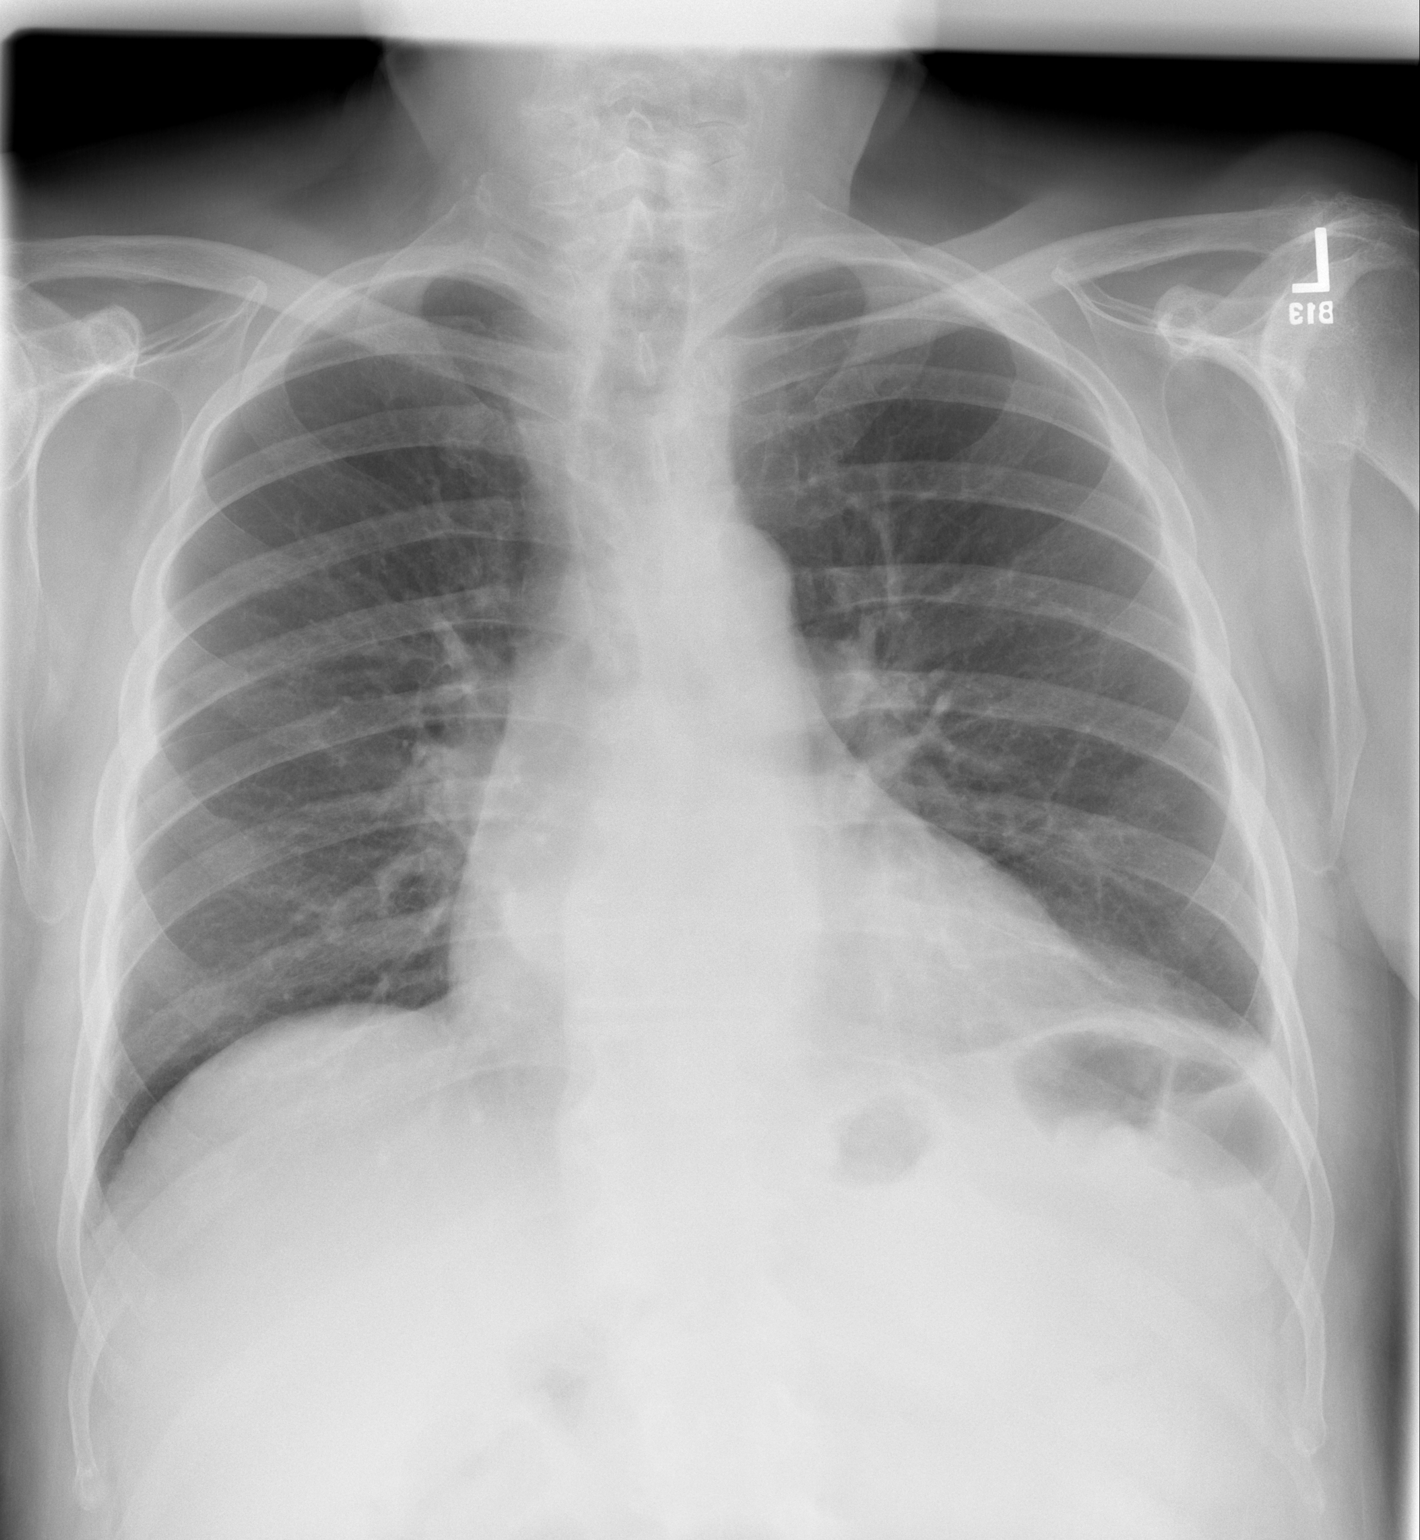

[w chest lat]
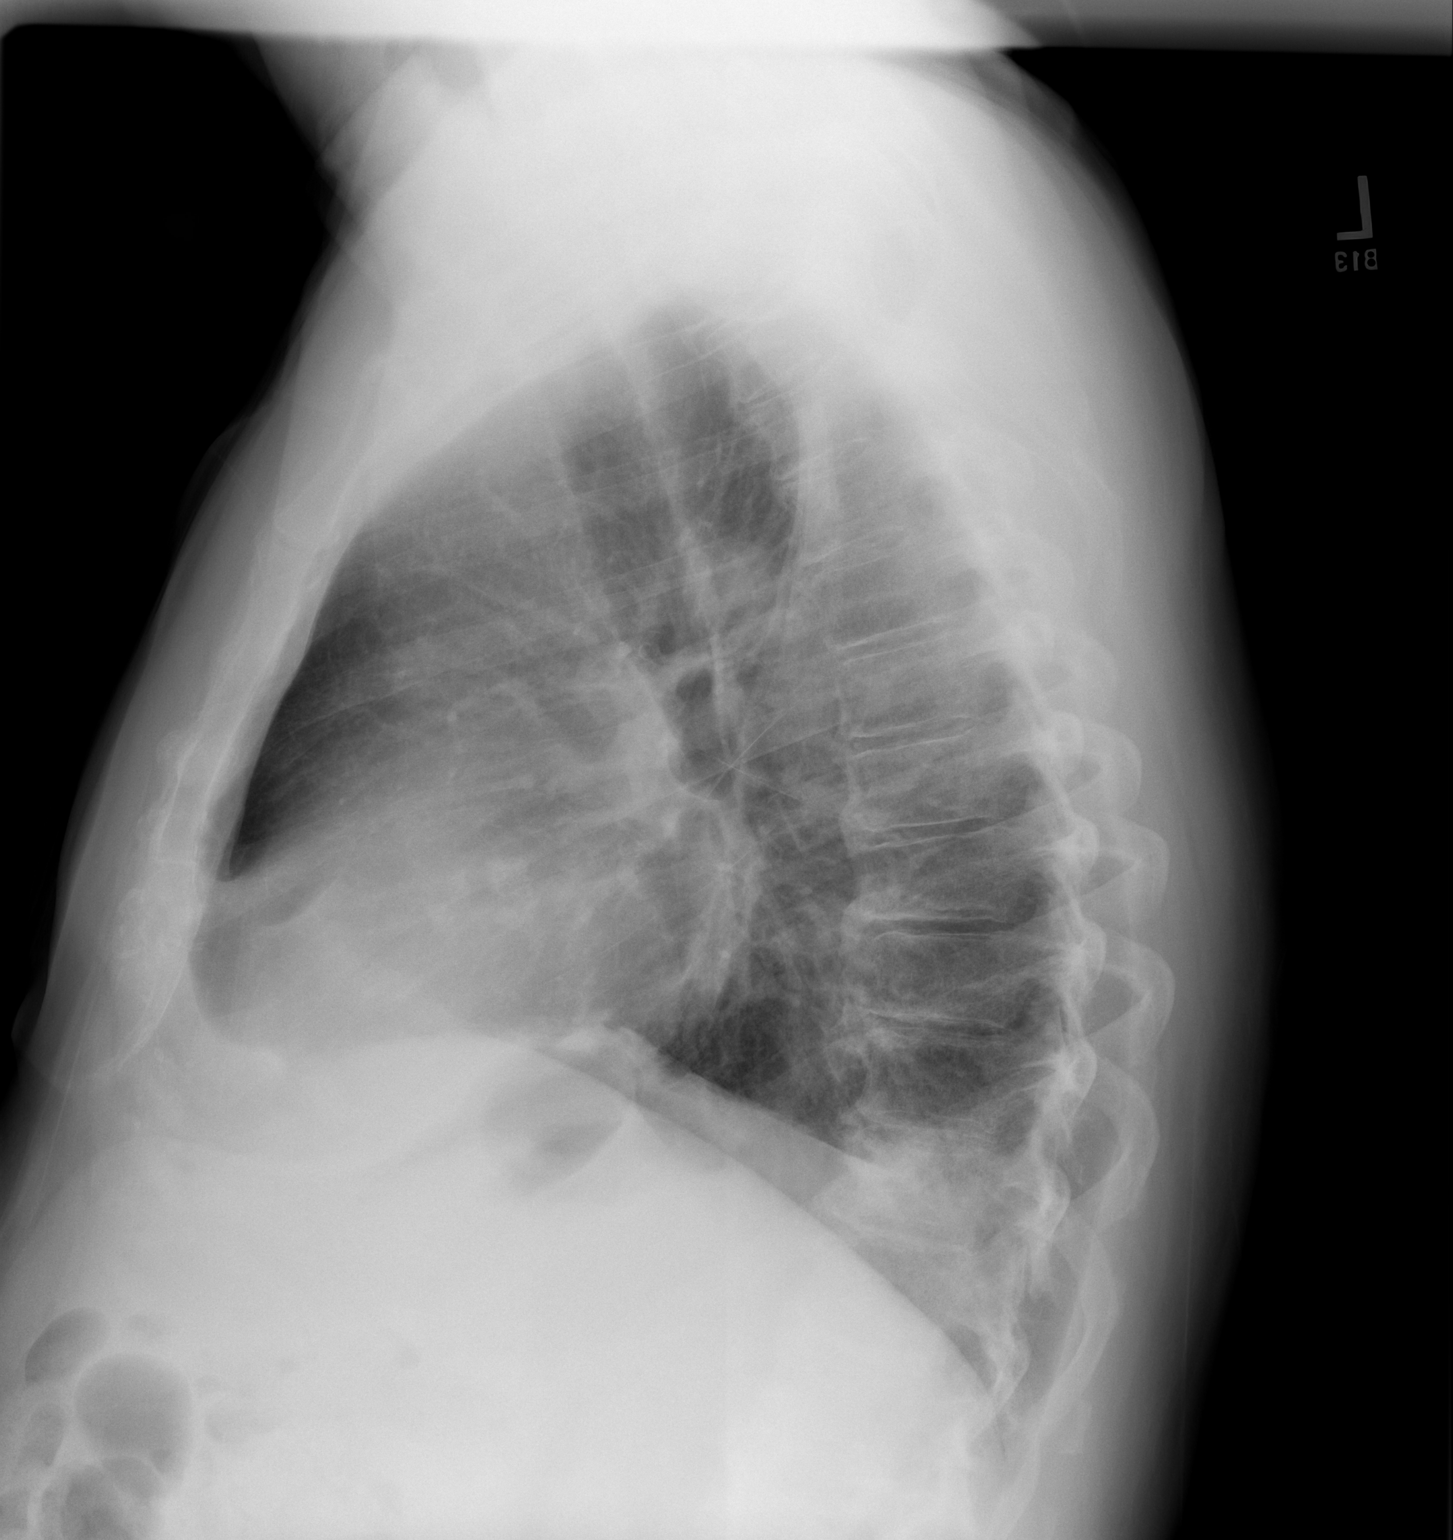

[2 of 2 positions shown; findings below may reference images not displayed]

FINDINGS: There is atelectatic change in the posterior left base. Lungs
elsewhere are clear. The heart size and pulmonary vascularity are
normal. No adenopathy. There is degenerative change in the thoracic
spine.
IMPRESSION: Posterior left base atelectasis. Lungs elsewhere clear. Stable
cardiac silhouette.

## 2018-12-25 ENCOUNTER — Telehealth (HOSPITAL_COMMUNITY): Payer: Self-pay

## 2018-12-25 NOTE — Telephone Encounter (Signed)
Phone call made to Pt to provide information about virtual cardiac rehab. Pt was responsive and wanted to move forward with virtual rehab. Informed Pt he would receive a text message to set up the application.

## 2018-12-26 ENCOUNTER — Encounter (HOSPITAL_COMMUNITY): Payer: Self-pay

## 2018-12-26 NOTE — Progress Notes (Signed)
Transitioning to virtual cardiac rehab  Dr.  [name]   As you are aware our department remains closed to patients due to Covid-19.  We are excited to be able to offer an alternative to traditional onsite Cardiac Rehab while your patient continues to follow Re-Open guidelines.  This is a notification that your patient has been contacted and is very interested in participating in Virtual Cardiac Rehab.  Thank you for your continued support in helping [place] meet the health care needs of our patients.  Tedra Senegal. Support Rep II  Cardiac Rehab staff

## 2018-12-31 ENCOUNTER — Encounter (HOSPITAL_COMMUNITY)
Admission: RE | Admit: 2018-12-31 | Discharge: 2018-12-31 | Disposition: A | Discharge status: Home / Self Care | Payer: Medicare Other | Source: Ambulatory Visit | Attending: Cardiology | Admitting: Cardiology

## 2018-12-31 ENCOUNTER — Telehealth (HOSPITAL_COMMUNITY): Payer: Self-pay

## 2018-12-31 ENCOUNTER — Encounter (HOSPITAL_COMMUNITY): Payer: Self-pay | Admitting: *Deleted

## 2018-12-31 DIAGNOSIS — G4733 Obstructive sleep apnea (adult) (pediatric): Secondary | ICD-10-CM | POA: Insufficient documentation

## 2018-12-31 DIAGNOSIS — Z952 Presence of prosthetic heart valve: Secondary | ICD-10-CM | POA: Insufficient documentation

## 2018-12-31 DIAGNOSIS — I252 Old myocardial infarction: Secondary | ICD-10-CM | POA: Insufficient documentation

## 2018-12-31 DIAGNOSIS — Z8546 Personal history of malignant neoplasm of prostate: Secondary | ICD-10-CM | POA: Insufficient documentation

## 2018-12-31 DIAGNOSIS — E119 Type 2 diabetes mellitus without complications: Secondary | ICD-10-CM | POA: Insufficient documentation

## 2018-12-31 DIAGNOSIS — I5032 Chronic diastolic (congestive) heart failure: Secondary | ICD-10-CM | POA: Insufficient documentation

## 2018-12-31 DIAGNOSIS — Z951 Presence of aortocoronary bypass graft: Secondary | ICD-10-CM | POA: Insufficient documentation

## 2018-12-31 DIAGNOSIS — I25118 Atherosclerotic heart disease of native coronary artery with other forms of angina pectoris: Secondary | ICD-10-CM | POA: Insufficient documentation

## 2018-12-31 DIAGNOSIS — I480 Paroxysmal atrial fibrillation: Secondary | ICD-10-CM | POA: Insufficient documentation

## 2018-12-31 DIAGNOSIS — D508 Other iron deficiency anemias: Secondary | ICD-10-CM | POA: Insufficient documentation

## 2018-12-31 DIAGNOSIS — E782 Mixed hyperlipidemia: Secondary | ICD-10-CM | POA: Insufficient documentation

## 2018-12-31 DIAGNOSIS — I110 Hypertensive heart disease with heart failure: Secondary | ICD-10-CM | POA: Insufficient documentation

## 2018-12-31 NOTE — Progress Notes (Signed)
Called and spoke to pt regarding Virtual Cardiac Rehab.  Pt  was able to download the [employer] app on their smart device with no issues. Pt set up their account and received the following welcome message -"Welcome to the [place] and Pulmonary Rehabilitation program. We hope that you will find the exercise program beneficial in your recovery process. Our staff is available to assist with in questions/concerns about your exercise routine. Best wishes". Brief orientation provided to with the advisement to watch the "Intro to Rehab" series located under the Resource tab. Pt verbalized understanding. Will continue to follow and monitor pt progress with feedback as needed. Noel Christmas 12/31/2018

## 2018-12-31 NOTE — Telephone Encounter (Signed)
° °        Confirm Consent - In the setting of the current Covid19 crisis, you are scheduled for a phone visit with your Cardiac or Pulmonary team member.  Just as we do with many in-gym visits, in order for you to participate in this visit, we must obtain consent.  If you'd like, I can send this to your mychart (if signed up) or email for you to review.  Otherwise, I can obtain your verbal consent now.  By agreeing to a telephone visit, we'd like you to understand that the technology does not allow for your Cardiac or Pulmonary Rehab team member to perform a physical assessment, and thus may limit their ability to fully assess your ability to perform exercise programs. If your provider identifies any concerns that need to be evaluated in person, we will make arrangements to do so.  Finally, though the technology is pretty good, we cannot assure that it will always work on either your or our end and we cannot ensure that we have a secure connection.  Cardiac and Pulmonary Rehab Telehealth visits and At Home cardiac and pulmonary rehab are provided at no cost to you.               Are you willing to proceed?" STAFF: Did the patient verbally acknowledge consent to telehealth visit? Document YES/NO here: Yes      Jessica C.   Cardiac and Pulmonary Rehab Staff   D6/08/2018 T2:36PM

## 2019-01-02 ENCOUNTER — Encounter: Payer: Self-pay | Admitting: Gastroenterology

## 2019-01-06 ENCOUNTER — Other Ambulatory Visit: Payer: Self-pay | Admitting: Cardiology

## 2019-01-18 ENCOUNTER — Telehealth (HOSPITAL_COMMUNITY): Payer: Self-pay

## 2019-01-18 NOTE — Telephone Encounter (Signed)
Pt was called to discuss lack of exercise log in the virtual CR app. Pt stated he has not been exercising much. Encouraged Pt to log whatever he was doing even if he was not getting the full length of time prescribed. Pt stated he would try to log today and if he had any questions would call to discuss.

## 2019-01-25 ENCOUNTER — Ambulatory Visit (INDEPENDENT_AMBULATORY_CARE_PROVIDER_SITE_OTHER): Payer: Medicare Other | Admitting: Gastroenterology

## 2019-01-25 ENCOUNTER — Other Ambulatory Visit: Payer: Self-pay | Admitting: Oncology

## 2019-01-25 ENCOUNTER — Encounter: Payer: Self-pay | Admitting: Gastroenterology

## 2019-01-25 VITALS — Ht 68.0 in | Wt 202.0 lb

## 2019-01-25 DIAGNOSIS — C61 Malignant neoplasm of prostate: Secondary | ICD-10-CM

## 2019-01-25 DIAGNOSIS — D509 Iron deficiency anemia, unspecified: Secondary | ICD-10-CM

## 2019-01-25 NOTE — Progress Notes (Signed)
This patient contacted our office requesting a physician telemedicine consultation regarding clinical questions and/or test results. Interactive audio and video telecommunications were attempted between this provider and the patient.  However, this technology failed due to the patient having technical difficulties OR they did not have access to video capabilities.  We continued and completed the visit with audio only.  If new patient, they were referred by [name], MD  Participants on the conference : myself and patient and his wife    _____________________________________________________  Referred by Dr. [name] of primary care for iron deficiency anemia.  Labs sent by that referring physician show iron of 30, 11% saturation and hemoglobin 8.3 on 01/03/2019.  Brief chart review shows this patient has a complex cardiac condition with MI in August 2019, subsequent one-vessel CABG along with open AVR October 2019.  He has had anemia since at least March 2019.  Brief conversation with this patient and his wife over the phone today indicate that he was seen by hematology consultant during hospitalization last year.  He then followed up with hematology March 2020 for his prostate cancer, no apparent further testing or treatment for the anemia.  He was apparently referred by primary care for consideration of endoscopic work-up.   Given his medical complexity, he will be arranged for a in person comprehensive consultation with me at the next available appointment.  However, he needs attention by primary care and hematology before then.  His blood counts need to be rechecked and, if not improving as expected, and certainly if worse, he needs IV iron.  All of this is essential prior to any GI evaluation.    - Wilfrid Lund, MD    [facility]

## 2019-01-27 ENCOUNTER — Other Ambulatory Visit: Payer: Self-pay | Admitting: Cardiology

## 2019-01-27 DIAGNOSIS — I484 Atypical atrial flutter: Secondary | ICD-10-CM

## 2019-01-28 ENCOUNTER — Telehealth: Payer: Self-pay | Admitting: *Deleted

## 2019-01-28 ENCOUNTER — Other Ambulatory Visit: Payer: Self-pay | Admitting: *Deleted

## 2019-01-28 NOTE — Telephone Encounter (Signed)
Received call from [facility] stating pt needs an MRI & will need BUN/Creat done & asked if we could add labs to be done when pt comes in tomorrow & send results to them.  They will fax order for lab with MD, Diagnosis code & fax # to send results & this will be given to lab.  Fax received & gave to lab tech.

## 2019-01-28 NOTE — Telephone Encounter (Signed)
Is this ok to send?  

## 2019-01-29 ENCOUNTER — Inpatient Hospital Stay: Payer: Medicare Other | Attending: Oncology

## 2019-01-29 ENCOUNTER — Other Ambulatory Visit: Payer: Self-pay

## 2019-01-29 DIAGNOSIS — D638 Anemia in other chronic diseases classified elsewhere: Secondary | ICD-10-CM | POA: Diagnosis not present

## 2019-01-29 DIAGNOSIS — I4891 Unspecified atrial fibrillation: Secondary | ICD-10-CM | POA: Insufficient documentation

## 2019-01-29 DIAGNOSIS — C61 Malignant neoplasm of prostate: Secondary | ICD-10-CM | POA: Diagnosis not present

## 2019-01-29 DIAGNOSIS — Z7901 Long term (current) use of anticoagulants: Secondary | ICD-10-CM | POA: Diagnosis not present

## 2019-01-29 DIAGNOSIS — Z79818 Long term (current) use of other agents affecting estrogen receptors and estrogen levels: Secondary | ICD-10-CM | POA: Diagnosis not present

## 2019-01-29 DIAGNOSIS — Z923 Personal history of irradiation: Secondary | ICD-10-CM | POA: Insufficient documentation

## 2019-01-29 DIAGNOSIS — Z79899 Other long term (current) drug therapy: Secondary | ICD-10-CM | POA: Diagnosis not present

## 2019-01-29 LAB — CBC WITH DIFFERENTIAL (CANCER CENTER ONLY)
Abs Immature Granulocytes: 0.02 10*3/uL (ref 0.00–0.07)
Basophils Absolute: 0.1 10*3/uL (ref 0.0–0.1)
Basophils Relative: 1 %
Eosinophils Absolute: 0.2 10*3/uL (ref 0.0–0.5)
Eosinophils Relative: 3 %
HCT: 26.4 % — ABNORMAL LOW (ref 39.0–52.0)
Hemoglobin: 8.3 g/dL — ABNORMAL LOW (ref 13.0–17.0)
Immature Granulocytes: 0 %
Lymphocytes Relative: 14 %
Lymphs Abs: 0.8 10*3/uL (ref 0.7–4.0)
MCH: 27.6 pg (ref 26.0–34.0)
MCHC: 31.4 g/dL (ref 30.0–36.0)
MCV: 87.7 fL (ref 80.0–100.0)
Monocytes Absolute: 0.5 10*3/uL (ref 0.1–1.0)
Monocytes Relative: 9 %
Neutro Abs: 4.1 10*3/uL (ref 1.7–7.7)
Neutrophils Relative %: 73 %
Platelet Count: 192 10*3/uL (ref 150–400)
RBC: 3.01 MIL/uL — ABNORMAL LOW (ref 4.22–5.81)
RDW: 16 % — ABNORMAL HIGH (ref 11.5–15.5)
WBC Count: 5.6 10*3/uL (ref 4.0–10.5)
nRBC: 0 % (ref 0.0–0.2)

## 2019-01-29 LAB — IRON AND TIBC
Iron: 60 ug/dL (ref 42–163)
Saturation Ratios: 19 % — ABNORMAL LOW (ref 20–55)
TIBC: 306 ug/dL (ref 202–409)
UIBC: 247 ug/dL (ref 117–376)

## 2019-01-29 LAB — FERRITIN: Ferritin: 160 ng/mL (ref 24–336)

## 2019-02-05 NOTE — Addendum Note (Signed)
Encounter addended by: Noel Christmas, RN on: 02/05/2019 11:21 AM  Actions taken: Clinical Note Signed

## 2019-02-05 NOTE — Progress Notes (Signed)
Discharge Progress Report  Patient Details  Name: Cody Phelps MRN: [id number] Date of Birth: June 06, 1941 Referring Provider:     [place] from 08/21/2018 in [place]  Referring Provider  Dr. [name]       Number of Visits: 19  Reason for Discharge:  Early Exit:  Pt was enrolled in the virtual cardiac rehab but is inactive in the app.  Cardiac rehab staff have been able to contact him via multiple attempts and methods.  Smoking History:  Social History   Tobacco Use  Smoking Status Former Smoker  . Years: 1.00  . Types: Cigarettes  . Quit date: 02/04/1967  . 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 [name]    Expected Discharge Date  11/26/18      NuStep   Level  2    SPM  75    Minutes  10    METs  2      Arm Ergometer   Level  1    Watts  30    Minutes  10    METs  2.75      Track   Laps  7    Minutes  10    METs  2      Prescription Details   Frequency (times per week)  3    Duration  Progress to 30 minutes of continuous aerobic without signs/symptoms of physical distress      Intensity   THRR 40-80% of Max Heartrate  58-116    Ratings of Perceived Exertion  11-13      Progression   Progression  Continue to progress workloads to maintain intensity without signs/symptoms of physical distress.      Resistance Training   Training Prescription  Yes    Weight  2 lbs.     Reps  10-15       Discharge Exercise Prescription (Final Exercise Prescription Changes): Exercise Prescription Changes - 10/10/18 0902      Response to Exercise   Blood Pressure (Admit)  114/58    Blood Pressure (Exercise)  110/68     Blood Pressure (Exit)  132/70    Heart Rate (Admit)  68 bpm    Heart Rate (Exercise)  78 bpm    Heart Rate (Exit)  58 bpm    Rating of Perceived Exertion (Exercise)  13    Symptoms  None    Duration  Progress to 30 minutes of  aerobic without signs/symptoms of physical distress    Intensity  THRR unchanged      Progression   Progression  Continue to progress workloads to maintain intensity without signs/symptoms of physical distress.    Average METs  2.3      Resistance Training   Training Prescription  No      Interval Training   Interval Training  No      NuStep   Level  3    SPM  75    Minutes  10    METs  2      Arm Ergometer   Level  1  Watts  11    Minutes  10    METs  2.65      Track   Laps  4    Minutes  10    METs  2.22      Home Exercise Plan   Plans to continue exercise at  Home (comment)    Frequency  Add 2 additional days to program exercise sessions.    Initial Home Exercises Provided  09/10/18       Functional Capacity: 6 Minute Walk    Row Name 08/21/18 1122         6 Minute Walk   Phase  Initial     Distance  1069 feet     Walk Time  6 minutes     # of Rest Breaks  0     MPH  2.02     METS  2.02     RPE  13     Symptoms  No     Resting HR  85 bpm     Resting BP  122/64     Resting Oxygen Saturation   99 %     Exercise Oxygen Saturation  during 6 min walk  100 %     Max Ex. HR  110 bpm     Max Ex. BP  130/74     2 Minute Post BP  130/70        Psychological, QOL, Others - Outcomes: PHQ 2/9: Depression screen [facility] 2/9 08/29/2018 08/20/2018 05/21/2018 10/17/2017 08/04/2017  Decreased Interest 0 0 0 0 0  Down, Depressed, Hopeless 0 0 0 0 0  PHQ - 2 Score 0 0 0 0 0    Quality of Life: Quality of Life - 09/14/18 1535      Quality of Life Scores   Health/Function Pre  19 %   pt c/o continue DOE and back pain with walking. pt somewhat interested in rollator for home use.    Socioeconomic Pre  23.5 %    Psych/Spiritual Pre   23.5 %    Family Pre  27.6 %    GLOBAL Pre  22.2 %   overall scores reflective of pt inability to perform previous activities.       Personal Goals: Goals established at orientation with interventions provided to work toward goal. Personal Goals and Risk Factors at Admission - 08/21/18 1127      Core Components/Risk Factors/Patient Goals on Admission    Weight Management  Yes;Obesity;Weight Maintenance;Weight Loss    Intervention  Weight Management: Develop a combined nutrition and exercise program designed to reach desired caloric intake, while maintaining appropriate intake of nutrient and fiber, sodium and fats, and appropriate energy expenditure required for the weight goal.;Weight Management: Provide education and appropriate resources to help participant work on and attain dietary goals.;Weight Management/Obesity: Establish reasonable short term and long term weight goals.;Obesity: Provide education and appropriate resources to help participant work on and attain dietary goals.    Admit Weight  205 lb 14.6 oz (93.4 kg)    Expected Outcomes  Short Term: Continue to assess and modify interventions until short term weight is achieved;Long Term: Adherence to nutrition and physical activity/exercise program aimed toward attainment of established weight goal;Weight Maintenance: Understanding of the daily nutrition guidelines, which includes 25-35% calories from fat, 7% or less cal from saturated fats, less than 256m cholesterol, less than 1.5gm of sodium, & 5 or more servings of fruits and vegetables daily;Weight Loss: Understanding of general recommendations  for a balanced deficit meal plan, which promotes 1-2 lb weight loss per week and includes a negative energy balance of [phone] kcal/d;Understanding recommendations for meals to include 15-35% energy as protein, 25-35% energy from fat, 35-60% energy from carbohydrates, less than 232m of dietary cholesterol, 20-35 gm of total fiber  daily;Understanding of distribution of calorie intake throughout the day with the consumption of 4-5 meals/snacks    Diabetes  Yes    Intervention  Provide education about signs/symptoms and action to take for hypo/hyperglycemia.;Provide education about proper nutrition, including hydration, and aerobic/resistive exercise prescription along with prescribed medications to achieve blood glucose in normal ranges: Fasting glucose 65-99 mg/dL    Expected Outcomes  Short Term: Participant verbalizes understanding of the signs/symptoms and immediate care of hyper/hypoglycemia, proper foot care and importance of medication, aerobic/resistive exercise and nutrition plan for blood glucose control.;Long Term: Attainment of HbA1C < 7%.    Hypertension  Yes    Intervention  Provide education on lifestyle modifcations including regular physical activity/exercise, weight management, moderate sodium restriction and increased consumption of fresh fruit, vegetables, and low fat dairy, alcohol moderation, and smoking cessation.;Monitor prescription use compliance.    Expected Outcomes  Long Term: Maintenance of blood pressure at goal levels.;Short Term: Continued assessment and intervention until BP is < 140/942mHG in hypertensive participants. < 130/8016mG in hypertensive participants with diabetes, heart failure or chronic kidney disease.    Lipids  Yes    Intervention  Provide education and support for participant on nutrition & aerobic/resistive exercise along with prescribed medications to achieve LDL <32m36mDL >40mg22m Expected Outcomes  Short Term: Participant states understanding of desired cholesterol values and is compliant with medications prescribed. Participant is following exercise prescription and nutrition guidelines.;Long Term: Cholesterol controlled with medications as prescribed, with individualized exercise RX and with personalized nutrition plan. Value goals: LDL < 32mg,14m > 40 mg.    Stress  Yes     Intervention  Offer individual and/or small group education and counseling on adjustment to heart disease, stress management and health-related lifestyle change. Teach and support self-help strategies.;Refer participants experiencing significant psychosocial distress to appropriate mental health specialists for further evaluation and treatment. When possible, include family members and significant others in education/counseling sessions.    Expected Outcomes  Short Term: Participant demonstrates changes in health-related behavior, relaxation and other stress management skills, ability to obtain effective social support, and compliance with psychotropic medications if prescribed.;Long Term: Emotional wellbeing is indicated by absence of clinically significant psychosocial distress or social isolation.        Personal Goals Discharge: Goals and Risk Factor Review    Row Name 08/29/18 1542 09/06/18 0846 10/02/18 1355 10/17/18 1220       Core Components/Risk Factors/Patient Goals Review   Personal Goals Review  Weight Management/Obesity;Diabetes;Hypertension;Lipids;Stress  Weight Management/Obesity;Diabetes;Hypertension;Lipids;Stress  Weight Management/Obesity;Diabetes;Hypertension;Lipids;Stress  Weight Management/Obesity;Diabetes;Hypertension;Lipids;Stress    Review  pt with multiple CAD RF demonstrates eagerness to participate in CR opportunities. pt personal goals are weight loss, increased strength/stamina and improved posture. pt interested in getting rollator for home use.  pt interested in building core strength.    pt with multiple CAD RF demonstrates eagerness to participate in CR opportunities. pt personal goals are weight loss, increased strength/stamina and improved posture. pt interested in getting rollator for home use.  pt interested in building core strength.    pt with multiple CAD RF demonstrates eagerness to participate in CR opportunities. pt personal goals are weight loss, increased  strength/stamina and improved posture. pt interested in getting rollator for home use.  pt interested in building core strength. pt admites walking continues to be difficult, limited by back pain.  pt atrial flutter currenlty treated with amiodarone 170m daily.     pt with multiple CAD RF demonstrates eagerness to participate in CR opportunities. pt personal goals are weight loss, increased strength/stamina and improved posture. pt interested in getting rollator for home use.  pt interested in building core strength. pt admites walking continues to be difficult, limited by back pain.  pt atrial flutter currenlty treated with amiodarone 1063mdaily.    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     Expected Outcomes  pt will participate in CR exercise, nutrition and lifestyle modification to decrease overall RF.    pt will participate in CR exercise, nutrition and lifestyle modification to decrease overall RF.    pt will participate in CR exercise, nutrition and lifestyle modification to decrease overall RF.    pt will participate in CR exercise, nutrition and lifestyle modification to decrease overall RF.         Exercise Goals and Review: Exercise Goals    Row Name 08/21/18 0940             Exercise Goals   Increase Physical Activity  Yes       Intervention  Provide advice, education, support and counseling about physical activity/exercise needs.;Develop an individualized exercise prescription for aerobic and resistive training based on initial evaluation findings, risk stratification, comorbidities and participant's personal goals.       Expected Outcomes  Short Term: Attend rehab on a regular basis to increase amount of physical activity.;Long Term: Add in home exercise to make exercise part of routine and to increase amount of physical activity.;Long Term: Exercising regularly at least 3-5 days a week.       Increase Strength and Stamina  Yes       Intervention   Provide advice, education, support and counseling about physical activity/exercise needs.;Develop an individualized exercise prescription for aerobic and resistive training based on initial evaluation findings, risk stratification, comorbidities and participant's personal goals.       Expected Outcomes  Short Term: Increase workloads from initial exercise prescription for resistance, speed, and METs.;Short Term: Perform resistance training exercises routinely during rehab and add in resistance training at home;Long Term: Improve cardiorespiratory fitness, muscular endurance and strength as measured by increased METs and functional capacity (6MWT)       Able to understand and use rate of perceived exertion (RPE) scale  Yes       Intervention  Provide education and explanation on how to use RPE scale       Expected Outcomes  Short Term: Able to use RPE daily in rehab to express subjective intensity level;Long Term:  Able to use RPE to guide intensity level when exercising independently       Knowledge and understanding of Target Heart Rate Range (THRR)  Yes       Intervention  Provide education and explanation of THRR including how the numbers were predicted and where they are located for reference       Expected Outcomes  Short Term: Able to state/look up THRR;Long Term: Able to use THRR to govern intensity when exercising independently;Short Term: Able to use daily as guideline for intensity in rehab       Able to check pulse independently  Yes  Intervention  Provide education and demonstration on how to check pulse in carotid and radial arteries.;Review the importance of being able to check your own pulse for safety during independent exercise       Expected Outcomes  Short Term: Able to explain why pulse checking is important during independent exercise;Long Term: Able to check pulse independently and accurately       Understanding of Exercise Prescription  Yes       Intervention  Provide  education, explanation, and written materials on patient's individual exercise prescription       Expected Outcomes  Short Term: Able to explain program exercise prescription;Long Term: Able to explain home exercise prescription to exercise independently          Exercise Goals Re-Evaluation: Exercise Goals Re-Evaluation    Row Name 08/29/18 1524 09/06/18 1042 09/10/18 1505 10/02/18 1119 10/19/18 0904     Exercise Goal Re-Evaluation   Exercise Goals Review  Increase Physical Activity;Increase Strength and Stamina;Understanding of Exercise Prescription;Knowledge and understanding of Target Heart Rate Range (THRR);Able to understand and use rate of perceived exertion (RPE) scale  Increase Physical Activity;Increase Strength and Stamina;Able to understand and use rate of perceived exertion (RPE) scale;Knowledge and understanding of Target Heart Rate Range (THRR);Understanding of Exercise Prescription;Able to check pulse independently  Increase Physical Activity;Increase Strength and Stamina;Able to understand and use rate of perceived exertion (RPE) scale;Knowledge and understanding of Target Heart Rate Range (THRR);Understanding of Exercise Prescription;Able to check pulse independently  Increase Physical Activity;Increase Strength and Stamina;Able to understand and use rate of perceived exertion (RPE) scale;Knowledge and understanding of Target Heart Rate Range (THRR);Understanding of Exercise Prescription;Able to check pulse independently  Increase Physical Activity;Increase Strength and Stamina;Able to understand and use rate of perceived exertion (RPE) scale;Knowledge and understanding of Target Heart Rate Range (THRR);Understanding of Exercise Prescription;Able to check pulse independently   Comments  Pt first day of the program. Pt tolerated exercise well. Understood RPE scale, THRR, and prescription.   Reviewed METs and goals with Pt. Pt has a MET level of 2.0. Pt is not currently exercising at home.  Encouraged Pt to walk 2-3 days per week for 30 minutes in addition to the program.   Reviewed HEP with Pt. Pt was responsive and understand goal, THRR, RPE scale, weather precautions, NTG use, and end points of exercise. Pt is not currently exercising at home. Encouraged Pt to walk 2-3 days per week for 30-45 minutes in addition to Cardiac Rehab.    Reviewed METs and goals with Pt. Pt MET level is 2.3 and is tolerating exercise well. Pt is not currently exercising at home. Encouraged Pt to start walking 2-4 days a week for 30 minutes in addition to [employer].   Unable to review METs and goals with Pt due to closure of Cardiac Rehab for [place]. Pt MET level is 2.3. Pt is progressing well.    Expected Outcomes  Will continue to monitor and progress Pt as tolerated.   Will continue to monitor and progress Pt as tolerated.   Will continue to monitor and progress Pt as tolerated.   Will continue to monitor and progress Pt as tolerated.   Will continue to monitor and progress Pt as tolerated.       Nutrition & Weight - Outcomes: Pre Biometrics - 08/21/18 1123      Pre Biometrics   Height  5' 6.75" (1.695 m)    Weight  93.4 kg    Waist Circumference  43.25 inches  Hip Circumference  42.5 inches    Waist to Hip Ratio  1.02 %    BMI (Calculated)  32.51    Triceps Skinfold  21 mm    % Body Fat  32.7 %    Grip Strength  18.5 kg    Flexibility  0 in    Single Leg Stand  0.56 seconds        Nutrition: Nutrition Therapy & Goals - 08/21/18 1557      Nutrition Therapy   Diet  Heart healthy, carb modified      Personal Nutrition Goals   Nutrition Goal  Pt to identify and limit food sources of saturated fat, trans fat, refined carbohydrates and sodium    Personal Goal #2  Pt to identify food quantities necessary to achieve weight loss of 6-24 lbs. at graduation from cardiac rehab      [place], educate and counsel regarding individualized specific dietary  modifications aiming towards targeted core components such as weight, hypertension, lipid management, diabetes, heart failure and other comorbidities.    Expected Outcomes  Short Term Goal: Understand basic principles of dietary content, such as calories, fat, sodium, cholesterol and nutrients.;Long Term Goal: Adherence to prescribed nutrition plan.       Nutrition Discharge: Nutrition Assessments - 08/21/18 1559      Rate Your Plate Scores   Pre Score  26       Education Questionnaire Score: Knowledge Questionnaire Score - 08/21/18 1127      Knowledge Questionnaire Score   Pre Score  23/24       Goals reviewed with patient; copy given to patient.

## 2019-02-07 ENCOUNTER — Telehealth: Payer: Self-pay

## 2019-02-07 NOTE — Telephone Encounter (Signed)
Dr. [name] replied to my message about this on 6/26 that he would check labs - appears to have done so.   I will await for a reply from Dr. [name] about his thoughts on the anemia and whether IV iron or other treatment is needed.  It does not seem to me that this anemia is likely to be from chronic GI blood loss.  Please make him an in-person new patient clinic appointment with me in August.

## 2019-02-07 NOTE — Telephone Encounter (Signed)
Pt has a follow up here in person office visit. 02-25-2019

## 2019-02-07 NOTE — Telephone Encounter (Signed)
Called to follow up on after care for Mr Cody Phelps. He had labs drawn on 01-29-2019 by Dr [name]. He stated he hasn't heard from his office. He is scheduled for an MRI today for his back pain ordered by Dr Saintclair Halsted.

## 2019-02-07 NOTE — Telephone Encounter (Signed)
I am out of the office this week and will return next week. Will contact the patient and discuss his labs. Looks he multifactorial anemia.

## 2019-02-08 ENCOUNTER — Telehealth: Payer: Self-pay | Admitting: Oncology

## 2019-02-08 NOTE — Telephone Encounter (Signed)
Scheduled apt per 7/09 sch message - pt aware of appt date and time

## 2019-02-19 ENCOUNTER — Other Ambulatory Visit: Payer: Self-pay

## 2019-02-19 ENCOUNTER — Other Ambulatory Visit: Payer: Self-pay | Admitting: Oncology

## 2019-02-19 ENCOUNTER — Telehealth: Payer: Self-pay | Admitting: Oncology

## 2019-02-19 ENCOUNTER — Inpatient Hospital Stay: Payer: Medicare Other | Attending: Oncology | Admitting: Oncology

## 2019-02-19 VITALS — BP 124/53 | HR 56 | Temp 98.9°F | Resp 17 | Ht 68.0 in | Wt 204.6 lb

## 2019-02-19 DIAGNOSIS — Z79899 Other long term (current) drug therapy: Secondary | ICD-10-CM | POA: Insufficient documentation

## 2019-02-19 DIAGNOSIS — Z7901 Long term (current) use of anticoagulants: Secondary | ICD-10-CM | POA: Diagnosis not present

## 2019-02-19 DIAGNOSIS — D649 Anemia, unspecified: Secondary | ICD-10-CM | POA: Diagnosis not present

## 2019-02-19 DIAGNOSIS — C61 Malignant neoplasm of prostate: Secondary | ICD-10-CM | POA: Insufficient documentation

## 2019-02-19 DIAGNOSIS — Z794 Long term (current) use of insulin: Secondary | ICD-10-CM | POA: Diagnosis not present

## 2019-02-19 DIAGNOSIS — Z923 Personal history of irradiation: Secondary | ICD-10-CM

## 2019-02-19 DIAGNOSIS — I4892 Unspecified atrial flutter: Secondary | ICD-10-CM | POA: Diagnosis not present

## 2019-02-19 NOTE — Progress Notes (Signed)
Hematology and Oncology Follow Up Visit  Cody Phelps [id number] 10/17/1940 78 y.o. 02/19/2019 9:56 AM Cody Phelps, MDSouth, Cody Main, MD   Principle Diagnosis: 78 year old man with:  1. Prostate cancer presented with localized disease,  Gleason score of 4+4 = 8 and a PSA 4.4 in 2018.    2.  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   Prior Therapy:  Definitive radiation therapy utilizing IMRT between August 15, 2017 and September 18, 2017.  He also received brachytherapy boost upfront on January 4 of 2019.  He received 45 Gy in 25 fraction to supplement his post implant of 110 Gy.  He is completing 2 years of androgen deprivation therapy under the care of Dr. Junious Silk.  Current therapy: Oral iron therapy.  Interim History: [name] returns today for a repeat evaluation.  Since the last visit, he reports no changes in his health.  He does report some fatigue and some tiredness but still ambulatory with the help of a cane without any recent falls.  He continues to have issues with persistent anemia with hemoglobin have drifted from 9.0 in November 2019 down to 8.3 on June 30.  Iron studies at that time showed ferritin level of 160 although saturation of 19%.  His RDW was also elevated.  He was started on oral iron replacement although has not been successful in replacing his iron stores.   He denied any alteration mental status, neuropathy, confusion or dizziness.  Denies any headaches or lethargy.  Denies any night sweats, weight loss or changes in appetite.  Denied orthopnea, dyspnea on exertion or chest discomfort.  Denies shortness of breath, difficulty breathing hemoptysis or cough.  Denies any abdominal distention, nausea, early satiety or dyspepsia.  Denies any hematuria, frequency, dysuria or nocturia.  Denies any skin irritation, dryness or rash.  Denies any ecchymosis or petechiae.  Denies any lymphadenopathy or  clotting.  Denies any heat or cold intolerance.  Denies any anxiety or depression.  Remaining review of system is negative.        Medications: Medication reviewed and updated today. Current Outpatient Medications  Medication Sig Dispense Refill  . acetaminophen (TYLENOL) 325 MG tablet Take 1 tablet (325 mg total) by mouth every 4 (four) hours as needed (headache, pain). 60 tablet 3  . amiodarone (PACERONE) 200 MG tablet TAKE 1 TABLET BY MOUTH  DAILY 90 tablet 1  . atorvastatin (LIPITOR) 80 MG tablet Take 1 tablet (80 mg total) by mouth daily at 6 PM. (Patient taking differently: Take 80 mg by mouth every evening. ) 60 tablet 3  . colesevelam (WELCHOL) 625 MG tablet Take 625 mg by mouth 3 (three) times daily.     Marland Kitchen donepezil (ARICEPT) 10 MG tablet Take 10 mg by mouth every morning.     Marland Kitchen esomeprazole (NEXIUM) 40 MG capsule Take 40 mg by mouth daily.     . ferrous sulfate 325 (65 FE) MG tablet Take 1 tablet (325 mg total) by mouth daily with breakfast. 90 tablet 2  . insulin glargine (LANTUS SOLOSTAR) 100 UNIT/ML injection Inject 20-44 Units into the skin at bedtime as needed (for BGL >150).     . memantine (NAMENDA) 5 MG tablet Take 5 mg by mouth 2 (two) times daily.    . metoprolol tartrate (LOPRESSOR) 25 MG tablet TAKE 1 TABLET BY MOUTH  DAILY 90 tablet 3  . mirabegron ER (MYRBETRIQ) 50 MG TB24 tablet Take 50 mg by mouth every  evening.     Marland Kitchen omega-3 acid ethyl esters (LOVAZA) 1 G capsule Take 2 g by mouth daily.     . ONE TOUCH ULTRA TEST test strip 1 strip by Other route daily.    . potassium chloride SA (K-DUR,KLOR-CON) 20 MEQ tablet Take 1 tablet (20 mEq total) by mouth daily with breakfast. 30 tablet 1  . Rivaroxaban (XARELTO) 15 MG TABS tablet Take 15 mg by mouth daily.     Marland Kitchen rOPINIRole (REQUIP) 2 MG tablet Take 2 mg by mouth 2 (two) times daily. 1600 & at bedtime    . spironolactone (ALDACTONE) 25 MG tablet TAKE ONE-HALF TABLET BY  MOUTH DAILY 45 tablet 2  . tamsulosin (FLOMAX)  0.4 MG CAPS capsule Take 0.4 mg by mouth daily after supper.     . torsemide (DEMADEX) 20 MG tablet Take 1 tablet (20 mg total) by mouth daily. 30 tablet 1  . valsartan-hydrochlorothiazide (DIOVAN-HCT) 80-12.5 MG tablet TAKE 1 TABLET BY MOUTH  EVERY MORNING 90 tablet 1   No current facility-administered medications for this visit.      Allergies: No Known Allergies  Past Medical History, Surgical history, Social history, and Family History are unchanged on review.    Physical Exam: Blood pressure (!) 124/53, pulse (!) 56, temperature 98.9 F (37.2 C), temperature source Oral, resp. rate 17, height 5' 8"  (1.727 m), weight 204 lb 9.6 oz (92.8 kg), SpO2 99 %.   ECOG: 1   General appearance: Comfortable appearing without any discomfort Head: Normocephalic without any trauma Oropharynx: Mucous membranes are moist and pink without any thrush or ulcers. Eyes: Pupils are equal and round reactive to light. Lymph nodes: No cervical, supraclavicular, inguinal or axillary lymphadenopathy.   Heart:regular rate and rhythm.  S1 and S2 without leg edema. Lung: Clear without any rhonchi or wheezes.  No dullness to percussion. Abdomin: Soft, nontender, nondistended with good bowel sounds.  No hepatosplenomegaly. Musculoskeletal: No joint deformity or effusion.  Full range of motion noted. Neurological: No deficits noted on motor, sensory and deep tendon reflex exam. Skin: No petechial rash or dryness.  Appeared moist.      Lab Results: Lab Results  Component Value Date   WBC 5.6 01/29/2019   HGB 8.3 (L) 01/29/2019   HCT 26.4 (L) 01/29/2019   MCV 87.7 01/29/2019   PLT 192 01/29/2019     Chemistry      Component Value Date/Time   NA 136 06/02/2018 0724   K 4.0 06/02/2018 0724   CL 110 06/02/2018 0724   CO2 21 (L) 06/02/2018 0724   BUN 19 06/02/2018 0724   CREATININE 0.90 06/02/2018 0724      Component Value Date/Time   CALCIUM 8.0 (L) 06/02/2018 0724   ALKPHOS 47 05/28/2018 1345    AST 18 05/28/2018 1345   ALT 23 05/28/2018 1345   BILITOT 0.4 05/28/2018 1345          Impression and Plan:  78 year old man with:    1.  Anemia: Normocytic, normochromic and has been chronic in nature and appears to be multifactorial.  Etiology is related to chronic blood loss from chronic anticoagulation, chronic disease and potentially early myelodysplastic syndrome.    Management options were reviewed at this time and he continues to have iron stores that are not fully repleted at this time.  I recommended replacing iron intravenously and then consider growth factor support if his counts remains low.  Risks and benefits of Feraheme infusion were reviewed today.  Complications  include nausea, fatigue, infusion related complications and rarely anaphylaxis.  Is agreeable to proceed at this time we will continue to monitor his counts after that.  Bone marrow biopsy and potentially growth factor support may be needed.  2.  Prostate cancer localized at the time of diagnosis after presenting with Gleason score 4+4 = 8, PSA 4.  He received definitive therapy with radiation completing 2 years of androgen deprivation.  At this time no further intervention is needed.  He will continue to active surveillance under the care of Dr. Junious Silk.  Different systemic therapy may be needed if he develops relapsed disease.  3.  Atrial flutter: Managed by Dr. [name] without any recent exacerbation.  4.  Follow-up: We will be in the near future to receive intravenous iron and MD follow-up in 2 to 3 months to follow-up on his labs.  25  minutes was spent with the patient face-to-face today.  More than 50% of time was spent on reviewing his disease status, laboratory data review and management options.   Zola Button, MD 7/21/20209:56 AM

## 2019-02-19 NOTE — Telephone Encounter (Signed)
Understood, thanks. I will see him as scheduled next week.

## 2019-02-19 NOTE — Telephone Encounter (Signed)
FYI- follow up on Cody Phelps. He had his appt today with Dr [name]. He's also schedule for an iron infusion on 02-26-2019

## 2019-02-19 NOTE — Telephone Encounter (Signed)
Scheduled appt per 7/21 los. ° °Printed calendar and avs. °

## 2019-02-20 ENCOUNTER — Encounter: Payer: Self-pay | Admitting: Cardiology

## 2019-02-20 ENCOUNTER — Ambulatory Visit (INDEPENDENT_AMBULATORY_CARE_PROVIDER_SITE_OTHER): Payer: Medicare Other | Admitting: Cardiology

## 2019-02-20 VITALS — BP 134/64 | HR 57 | Ht 68.0 in | Wt 204.6 lb

## 2019-02-20 DIAGNOSIS — Z953 Presence of xenogenic heart valve: Secondary | ICD-10-CM

## 2019-02-20 DIAGNOSIS — I4821 Permanent atrial fibrillation: Secondary | ICD-10-CM

## 2019-02-20 DIAGNOSIS — I25118 Atherosclerotic heart disease of native coronary artery with other forms of angina pectoris: Secondary | ICD-10-CM

## 2019-02-20 DIAGNOSIS — I5032 Chronic diastolic (congestive) heart failure: Secondary | ICD-10-CM
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
## 2019-02-22 ENCOUNTER — Telehealth: Payer: Self-pay

## 2019-02-22 NOTE — Telephone Encounter (Signed)
Covid-19 screening questions   Do you now or have you had a fever in the last 14 days? No  Do you have any respiratory symptoms of shortness of breath or cough now or in the last 14 days? No  Do you have any family members or close contacts with diagnosed or suspected Covid-19 in the past 14 days? No  Have you been tested for Covid-19 and found to be positive? No        

## 2019-02-25 ENCOUNTER — Encounter: Payer: Self-pay | Admitting: Gastroenterology

## 2019-02-25 ENCOUNTER — Ambulatory Visit (INDEPENDENT_AMBULATORY_CARE_PROVIDER_SITE_OTHER): Payer: Medicare Other | Admitting: Gastroenterology

## 2019-02-25 VITALS — BP 138/64 | HR 55 | Temp 97.8°F | Ht 68.0 in | Wt 205.0 lb

## 2019-02-25 DIAGNOSIS — K5909 Other constipation: Secondary | ICD-10-CM

## 2019-02-25 DIAGNOSIS — Z7901 Long term (current) use of anticoagulants: Secondary | ICD-10-CM | POA: Diagnosis not present

## 2019-02-25 DIAGNOSIS — K648 Other hemorrhoids: Secondary | ICD-10-CM

## 2019-02-25 DIAGNOSIS — D500 Iron deficiency anemia secondary to blood loss (chronic): Secondary | ICD-10-CM | POA: Diagnosis not present

## 2019-02-25 DIAGNOSIS — K625 Hemorrhage of anus and rectum: Secondary | ICD-10-CM

## 2019-02-25 NOTE — Patient Instructions (Signed)
If you are age 76 or older, your body mass index should be between 23-30. Your Body mass index is 31.17 kg/m. If this is out of the aforementioned range listed, please consider follow up with your Primary Care Provider.  If you are age 36 or younger, your body mass index should be between 19-25. Your Body mass index is 31.17 kg/m. If this is out of the aformentioned range listed, please consider follow up with your Primary Care Provider.   Please call in Sept 2020 to schedule a colonoscopy [phone]

## 2019-02-25 NOTE — Progress Notes (Signed)
Cody Phelps Consult Note:  History: Cody Phelps 02/25/2019  Referring provider: [name], MD  Reason for consult/chief complaint: Iron deficiency anemia  Subjective  HPI: Cody Phelps was referred by Cody. Forde Phelps for iron deficiency anemia.  He appeared to have anemia dating back to at least 2018 during a time of hospitalization for MI and CHF.  He and his wife reports he was seen by Cody. Beryle Phelps regarding the anemia.  I felt he needed reevaluation by hematology before his evaluation in our clinic, and coordinated a visit with Cody. Alen Phelps, which occurred on 02/19/2019. His note indicates the following: " Principle Diagnosis: 78 year old man with:   1. Prostate cancer presented with localized disease,  Gleason score of 4+4 = 8 and a PSA 4.4 in 2018.     2.  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  Interim History: Cody Phelps returns today for a repeat evaluation.  Since the last visit, he reports no changes in his health.  He does report some fatigue and some tiredness but still ambulatory with the help of a cane without any recent falls.  He continues to have issues with persistent anemia with hemoglobin have drifted from 9.0 in November 2019 down to 8.3 on June 30.  Iron studies at that time showed ferritin level of 160 although saturation of 19%.  His RDW was also elevated.  He was started on oral iron replacement although has not been successful in replacing his iron stores " Felt to be multifactorial anemia, and IV iron planned.  Cody Phelps is here with his wife Cody Phelps, who assists with a history because he has some memory difficulty.  He is chronically fatigued, which they attribute to his anemia.  He is scheduled for IV iron next week, and a treatment the following week.  He has also been having severe back pain affecting his mobility, and is scheduled for a back injection soon.  Of note, his  cardiologist is authorized him to be off his oral anticoagulation prior to that procedure.  He has described at least a year, though perhaps longer, of intermittent painless rectal bleeding.  He cannot say for sure how often it happens, but his wife seems to feel it may be more than he admits to.  She has seen a few occasions of what seems like a large amount of blood in the toilet bowl after bowel movement.  He reports that it occurs more often during periods of constipation when he has to strain, so he has attributed to hemorrhoids. It has been many years since he last had a colonoscopy.  Cody Phelps denies dysphagia, odynophagia, nausea, vomiting, early satiety, anorexia or weight loss.  His wife confirms this history.  ROS:  Review of Systems  Constitutional: Positive for fatigue. Negative for appetite change and unexpected weight change.  HENT: Negative for mouth sores and voice change.   Eyes: Negative for pain and redness.  Respiratory: Negative for cough and shortness of breath.   Cardiovascular: Negative for chest pain and palpitations.  Genitourinary: Negative for dysuria and hematuria.  Musculoskeletal: Positive for back pain. Negative for arthralgias and myalgias.  Skin: Negative for pallor and rash.  Neurological: Negative for weakness and headaches.  Hematological: Negative for adenopathy.     Past Medical History: Past Medical History:  Diagnosis Date  . Acute anterior wall MI ([place]) 03/01/2018  . Acute combined systolic and diastolic heart failure ([place]) 03/15/2018  . Bronchitis, acute 05/04/2012  .  Burn   . Carotid stenosis, left followed by Cody Phelps, cardiologist--- bilateral bruit per note   per Cody Phelps note [id number]  LICA 02-40% stenosis per duplex 05-14-2014  . Chronic diastolic CHF (congestive heart failure) ([place]) 09/21/2018  . Diverticulosis   . DM (diabetes mellitus) type 2, uncontrolled, with ketoacidosis ([place]) 11/25/2013  . ED (erectile dysfunction)   . First degree  heart block   . GERD (gastroesophageal reflux disease)   . Heart murmur   . Hypertension    cardiologsit-  Cody Phelps  . Iron deficiency anemia   . Mixed hyperlipidemia   . Morbid obesity ([place])   . Myocardial infarction ([place])    02/28/18  . OA (osteoarthritis)    right hip  . OSA on CPAP    followed by Cody dohmeier, uses CPAP  . PAF (paroxysmal atrial fibrillation) ([name])    a. on Eliquis  . Prostate cancer ([place])    dx 03-17-2017 (bx)  Stage T2a, Gleason 4+4, PSA  4.43, vol 32.32--  s/p radiatctive prostate seed implants 07-10-2017 then IMRT and ADT  . RLS (restless legs syndrome)   . S/P aortic valve replacement with bioprosthetic valve 05/30/2018   23 mm Edwards Inspiris Resilia stented bovine pericardial tissue valve  . 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 S/P Maze operation for atrial fibrillation 05/30/2018   Complete bilateral atrial lesion set using bipolar radiofrequency and cryothermy ablation with clipping of LA appendage  . Scoliosis   . Severe aortic stenosis   . Trigger finger   . Type 2 diabetes mellitus ([place])   . Wears partial dentures    upper and lower     Past Surgical History: Past Surgical History:  Procedure Laterality Date  . AORTIC VALVE REPLACEMENT N/A 05/30/2018   Procedure: AORTIC VALVE REPLACEMENT (AVR) using Inspiris Valve, Size 23;  Surgeon: [name], MD;  Location: [place];  Service: Open Heart Surgery;  Laterality: N/A;  . APPENDECTOMY  1988  . BILATERAL TOTAL ETHMOIDECTOMY AND SPHENOIDECTOMY  01-14-2009    Cody bates   [facility]  . CARDIOVERSION N/A 08/10/2018   Procedure: CARDIOVERSION;  Surgeon: Adrian Prows, MD;  Location: [place];  Service: Cardiovascular;  Laterality: N/A;  . CARPAL TUNNEL RELEASE Bilateral 2013  . CATARACT EXTRACTION W/ INTRAOCULAR LENS  IMPLANT, BILATERAL  date?  . CORONARY ARTERY BYPASS GRAFT N/A 05/30/2018   Procedure: CORONARY ARTERY BYPASS GRAFTING (CABG) x one using left internal mammary artery;   Surgeon: [name], MD;  Location: [place];  Service: Open Heart Surgery;  Laterality: N/A;  . CORONARY BALLOON ANGIOPLASTY N/A 03/01/2018   Procedure: CORONARY BALLOON ANGIOPLASTY;  Surgeon: Adrian Prows, MD;  Location: [place] [facility];  Service: Cardiovascular;  Laterality: N/A;  . CORONARY/GRAFT ACUTE MI REVASCULARIZATION N/A 03/01/2018   Procedure: Coronary/Graft Acute MI Revascularization;  Surgeon: Adrian Prows, MD;  Location: [place] [facility];  Service: Cardiovascular;  Laterality: N/A;  . CYSTOSCOPY  07/19/2017   Procedure: CYSTOSCOPY;  Surgeon: Nickie Retort, MD;  Location: [facility];  Service: Urology;;  No seeds found in [place]  . LAMINECTOMY WITH POSTERIOR LATERAL ARTHRODESIS LEVEL 2 N/A 02/12/2014   Procedure: LUMBAR TWO-THREE,LUIMBAR THREE-FOUR LAMINECTOMY/FORAMINOTOMY;POSSIBLE POSTEROLATERAL ARTHRODESIS WITH AUTOGRAFT;  Surgeon: [name], MD;  Location: [facility];  Service: Neurosurgery;  Laterality: N/A;  . LEFT HEART CATH AND CORONARY ANGIOGRAPHY N/A 03/01/2018   Procedure: LEFT HEART CATH AND CORONARY ANGIOGRAPHY;  Surgeon: Cody Phelps,  Ulice Dash, MD;  Location: [place] [facility];  Service: Cardiovascular;  Laterality: N/A;  . MAZE N/A 05/30/2018   Procedure: MAZE;  Surgeon: [name], MD;  Location: [place];  Service: Open Heart Surgery;  Laterality: N/A;  . ORIF RIGHT ANKLE FX'S  05/05/2001   retained hardware  . PROSTATE BIOPSY  03-17-2017   Cody Pilar [facility] office  . RADIOACTIVE SEED IMPLANT N/A 07/19/2017   Procedure: RADIOACTIVE SEED IMPLANT/BRACHYTHERAPY IMPLANT;  Surgeon: Nickie Retort, MD;  Location: [facility];  Service: Urology;  Laterality: N/A;  69 seeds implanted  . SKIN GRAFT    . SPACE OAR INSTILLATION N/A 07/19/2017   Procedure: SPACE OAR INSTILLATION;  Surgeon: Nickie Retort, MD;  Location: [facility];  Service: Urology;  Laterality: N/A;  . TEE WITHOUT CARDIOVERSION  03/20/2012   Procedure:  TRANSESOPHAGEAL ECHOCARDIOGRAM (TEE);  Surgeon: Laverda Page, MD;  Location: [facility];  Service: Cardiovascular;  Laterality: N/A;  normal LV; normal EF; normal RV; normal LA w/ left atrial appendage very small, normal function, interatrial septum intact without defect; normal RA; trace MR,TR, & PI; mild AV calcification and senile degeneration w/ mild stenosis, AVA 1.7cm^2;;   . TEE WITHOUT CARDIOVERSION N/A 05/30/2018   Procedure: TRANSESOPHAGEAL ECHOCARDIOGRAM (TEE);  Surgeon: [name], MD;  Location: [place];  Service: Open Heart Surgery;  Laterality: N/A;  . TOTAL HIP ARTHROPLASTY Right 10/23/2017   Procedure: TOTAL HIP ARTHROPLASTY ANTERIOR APPROACH;  Surgeon: Frederik Pear, MD;  Location: [place];  Service: Orthopedics;  Laterality: Right;  . TRANSTHORACIC ECHOCARDIOGRAM  01-11-2017   Cody Phelps  (per echo note, no significant change in seveity of AS, no other diagnostic change)   moderate concentric LVH, ef 31%, grade 1 diastolic dysfunction/  moderate LAE/  mild , grade 1 AR w/ moderate AV calcification, mild to moderate restricted AV leaflets w/ moderate AS, AVA 1.16cm^2, peak grandient 53mmHg, mean grandient 35mmHg/  trace MR, mild calcification MV annulus , mild MV leaflet calcification, mild MVS, peak grandient 4.42mmHg, mean grandient 2.72mmHg/ trace TR     Family History: Family History  Problem Relation Age of Onset  . Stroke Mother   . Hypertension Mother   . Heart attack Father   . Heart murmur Father   . Hypertension Sister   . Colon cancer Neg Hx   . Stomach cancer Neg Hx   . Rectal cancer Neg Hx   . Pancreatic cancer Neg Hx     Social History: Social History   Socioeconomic History  . Marital status: Married    Spouse name: Cody Phelps  . Number of children: 2  . Years of education: 40  . Highest education level: Not on file  Occupational History  . Not on file  Social Needs  . Financial resource strain: Not on file  . Food insecurity    Worry: Not on file     Inability: Not on file  . Transportation needs    Medical: Not on file    Non-medical: Not on file  Tobacco Use  . Smoking status: Former Smoker    Years: 1.00    Types: Cigarettes    Quit date: 02/04/1967    Years since quitting: 52.0  . Smokeless tobacco: Never Used  . Tobacco comment: smoked 1 q2-3 days  Substance and Sexual Activity  . Alcohol use: Yes    Comment: occasional   . Drug use: No  . Sexual activity: Yes  Lifestyle  . Physical  activity    Days per week: Not on file    Minutes per session: Not on file  . Stress: Not on file  Relationships  . Social Herbalist on phone: Not on file    Gets together: Not on file    Attends religious service: Not on file    Active member of club or organization: Not on file    Attends meetings of clubs or organizations: Not on file    Relationship status: Not on file  Other Topics Concern  . Not on file  Social History Narrative   Patient is married Cody Phelps).   Patient has two children.   Patient is retired.   Patient has a college education.   Patient is right-handed.   Patient drinks two cups of coffee per day and 2-3 cups of Diet soda daily and limited tea.          Allergies: No Known Allergies  Outpatient Meds: Current Outpatient Medications  Medication Sig Dispense Refill  . acetaminophen (TYLENOL) 325 MG tablet Take 1 tablet (325 mg total) by mouth every 4 (four) hours as needed (headache, pain). 60 tablet 3  . atorvastatin (LIPITOR) 80 MG tablet Take 1 tablet (80 mg total) by mouth daily at 6 PM. (Patient taking differently: Take 80 mg by mouth every evening. 1/2 tab daily) 60 tablet 3  . colesevelam (WELCHOL) 625 MG tablet Take 625 mg by mouth 3 (three) times daily.     Marland Kitchen donepezil (ARICEPT) 10 MG tablet Take 10 mg by mouth every morning.     Marland Kitchen esomeprazole (NEXIUM) 40 MG capsule Take 40 mg by mouth daily.     . insulin glargine (LANTUS SOLOSTAR) 100 UNIT/ML injection Inject 20-40 Units into the skin  at bedtime as needed (for BGL >150).     . memantine (NAMENDA) 5 MG tablet Take 5 mg by mouth daily.     . metoprolol tartrate (LOPRESSOR) 25 MG tablet TAKE 1 TABLET BY MOUTH  DAILY (Patient taking differently: 12.5 mg 2 (two) times a day. ) 90 tablet 3  . mirabegron ER (MYRBETRIQ) 50 MG TB24 tablet Take 50 mg by mouth every evening.     Marland Kitchen omega-3 acid ethyl esters (LOVAZA) 1 G capsule Take 2 g by mouth daily.     . Rivaroxaban (XARELTO) 15 MG TABS tablet Take 15 mg by mouth daily.     Marland Kitchen rOPINIRole (REQUIP) 2 MG tablet Take 2 mg by mouth as needed. 1600 & at bedtime    . spironolactone (ALDACTONE) 25 MG tablet TAKE ONE-HALF TABLET BY  MOUTH DAILY 45 tablet 2  . tamsulosin (FLOMAX) 0.4 MG CAPS capsule Take 0.4 mg by mouth daily after supper.     . torsemide (DEMADEX) 20 MG tablet Take 1 tablet (20 mg total) by mouth daily. (Patient taking differently: Take 20 mg by mouth as needed. ) 30 tablet 1  . valsartan-hydrochlorothiazide (DIOVAN-HCT) 80-12.5 MG tablet TAKE 1 TABLET BY MOUTH  EVERY MORNING 90 tablet 1   No current facility-administered medications for this visit.       ___________________________________________________________________ Objective   Exam:  BP 138/64 (BP Location: Left Arm, Patient Position: Sitting, Cuff Size: Normal)   Pulse (!) 55   Temp 97.8 F (36.6 C) (Oral)   Ht 5\' 8"  (1.727 m)   Wt 205 lb (93 kg)   SpO2 98%   BMI 31.17 kg/m  His wife is present for the entire encounter  General: Chronically ill-appearing  man.  Alert and conversational, but has some difficulty recalling dates and events.  Healed burn right side of face  Eyes: sclera anicteric, no redness  ENT: oral mucosa moist without lesions, no cervical or supraclavicular lymphadenopathy  CV: RRR with soft systolic murmur, R5/J8, no JVD, mild pretibial peripheral edema  Resp: clear to auscultation bilaterally, normal RR and effort noted.  Sternotomy scar  GI: soft, no tenderness, with active  bowel sounds. No guarding or palpable organomegaly noted.  Skin; warm and dry, no rash or jaundice noted.+ Pale  Neuro: awake, alert and oriented x 3. Normal gross motor function and fluent speech Rectal: Normal external, normal sphincter tone, no palpable internal lesions, prostate enlarged.  No stool in rectal vault. Anoscopy: Internal hemorrhoids  Labs:  CBC Latest Ref Rng & Units 01/29/2019 06/02/2018 06/01/2018  WBC 4.0 - 10.5 K/uL 5.6 7.7 9.5  Hemoglobin 13.0 - 17.0 g/dL 8.3(L) 8.4(L) 9.0(L)  Hematocrit 39.0 - 52.0 % 26.4(L) 26.1(L) 28.1(L)  Platelets 150 - 400 K/uL 192 94(L) 97(L)  MCV 88, RDW 16  CMP Latest Ref Rng & Units 06/02/2018 06/01/2018 05/31/2018  Glucose 70 - 99 mg/dL 109(H) 146(H) 136(H)  BUN 8 - 23 mg/dL 19 18 14   Creatinine 0.61 - 1.24 mg/dL 0.90 1.12 1.08  Sodium 135 - 145 mmol/L 136 138 137  Potassium 3.5 - 5.1 mmol/L 4.0 4.6 3.7  Chloride 98 - 111 mmol/L 110 107 110  CO2 22 - 32 mmol/L 21(L) 21(L) 20(L)  Calcium 8.9 - 10.3 mg/dL 8.0(L) 8.2(L) 7.8(L)  Total Protein 6.5 - 8.1 g/dL - - -  Total Bilirubin 0.3 - 1.2 mg/dL - - -  Alkaline Phos 38 - 126 U/L - - -  AST 15 - 41 U/L - - -  ALT 0 - 44 U/L - - -    No recent vitamin B12 or folate  Iron 60, 16% saturation, ferritin 160   Cardiology office follow-up note 02/20/2019 notes the following: "Echocardiogram 07/11/2018: Left ventricle cavity is normal in size. Abnormal septal wall motion due to post-operative coronary artery bypass graft. Diastolic function assessment limited due to mitral annular calcification and post op status. Calculated EF 65%. Left atrial cavity is mildly dilated. Well seated bioprosthetic aortic valve with normal functioning. Mean PG 8 mmHg likely normal for bioprosthetic valve. Moderate calcification of the mitral valve annulus and leaflets. No significant stenosis. Moderate (Grade II) mitral regurgitation. Compared to previous study on 08/24/2017, bioprosthetic aortic valve is new."    Assessment: Encounter Diagnoses  Name Primary?  Marland Kitchen Anemia due to chronic blood loss Yes  . Rectal bleeding   . Chronic constipation   . Current use of long term anticoagulation   . Bleeding internal hemorrhoids     Multifactorial anemia that appears largely anemia of chronic disease, but he also has rectal bleeding most likely hemorrhoidal based on history and today's exam.  Bleeding exacerbated by chronic [place].  It also sounds like he sits on the toilet for prolonged periods of time and strains, which I have advised that he not do.  On further questioning, he seems to have a bowel movement once or twice every day.  However, he may feel the urge, then sit and if nothing happens, he will sit for prolonged period of time and strain.  His wife said he likes to bring a book or the newspaper into the bathroom.  I recommended they stop the oral iron since they have both noticed it worsens his constipation and therefore  the hemorrhoidal bleeding.  He will be getting IV iron, so should not need oral iron anymore, and he is not tolerating the side effects well.  He should have a colonoscopy to evaluate any other causes of the lower GI bleeding.  It is been many years since he last had a colonoscopy.  They are agreeable to him having it done, but would like to wait until after his planned back injection in late August.  I would also like him to have more time for the 2 upcoming iron infusions to hopefully improve his hemoglobin in order to decrease the risk of the procedure.  His cardiologist has authorized him being off his oral anticoagulant prior to back injection, so I expect they will be agreeable to him doing the same before colonoscopy.  We discussed the colonoscopy, including the risks and benefits and he is agreeable.  The benefits and risks of the planned procedure were described in detail with the patient or (when appropriate) their health care proxy.  Risks were outlined as including, but not  limited to, bleeding, infection, perforation, adverse medication reaction leading to cardiac or pulmonary decompensation.  The limitation of incomplete mucosal visualization was also discussed.  No guarantees or warranties were given.  Patient at increased risk for cardiopulmonary complications of procedure due to medical comorbidities.  As they would like to wait until after his back injection, we will plan to do it in September.  We have set a clinical reminder to contact him at that time, but I also asked his wife to call us after he has undergone his back injection to let us know that they feel ready to proceed with colonoscopy.   Thank you for the courtesy of this consult.  Please call me with any questions or concerns.  Nelida Meuse III  CC: Referring provider noted above and Cody. Zola Button

## 2019-02-26 ENCOUNTER — Inpatient Hospital Stay: Payer: Medicare Other

## 2019-02-26 ENCOUNTER — Other Ambulatory Visit: Payer: Self-pay

## 2019-02-26 VITALS — BP 162/69 | HR 52 | Temp 98.2°F | Resp 18

## 2019-02-26 DIAGNOSIS — C61 Malignant neoplasm of prostate: Secondary | ICD-10-CM

## 2019-02-26 DIAGNOSIS — D649 Anemia, unspecified: Secondary | ICD-10-CM | POA: Diagnosis not present

## 2019-02-26 LAB — CBC WITH DIFFERENTIAL (CANCER CENTER ONLY)
Abs Immature Granulocytes: 0.01 10*3/uL (ref 0.00–0.07)
Basophils Absolute: 0.1 10*3/uL (ref 0.0–0.1)
Basophils Relative: 1 %
Eosinophils Absolute: 0.3 10*3/uL (ref 0.0–0.5)
Eosinophils Relative: 5 %
HCT: 29.4 % — ABNORMAL LOW (ref 39.0–52.0)
Hemoglobin: 9.5 g/dL — ABNORMAL LOW (ref 13.0–17.0)
Immature Granulocytes: 0 %
Lymphocytes Relative: 18 %
Lymphs Abs: 1.1 10*3/uL (ref 0.7–4.0)
MCH: 27.6 pg (ref 26.0–34.0)
MCHC: 32.3 g/dL (ref 30.0–36.0)
MCV: 85.5 fL (ref 80.0–100.0)
Monocytes Absolute: 0.6 10*3/uL (ref 0.1–1.0)
Monocytes Relative: 9 %
Neutro Abs: 4.1 10*3/uL (ref 1.7–7.7)
Neutrophils Relative %: 67 %
Platelet Count: 168 10*3/uL (ref 150–400)
RBC: 3.44 MIL/uL — ABNORMAL LOW (ref 4.22–5.81)
RDW: 15.4 % (ref 11.5–15.5)
WBC Count: 6.2 10*3/uL (ref 4.0–10.5)
nRBC: 0 % (ref 0.0–0.2)

## 2019-02-26 LAB — IRON AND TIBC
Iron: 41 ug/dL — ABNORMAL LOW (ref 42–163)
Saturation Ratios: 13 % — ABNORMAL LOW (ref 20–55)
TIBC: 319 ug/dL (ref 202–409)
UIBC: 278 ug/dL (ref 117–376)

## 2019-02-26 LAB — FERRITIN: Ferritin: 138 ng/mL (ref 24–336)

## 2019-02-26 MED ORDER — SODIUM CHLORIDE 0.9 % IV SOLN
Freq: Once | INTRAVENOUS | Status: AC
  Administered 2019-02-26: 15:00:00 via INTRAVENOUS
  Filled 2019-02-26: qty 250

## 2019-02-26 MED ORDER — SODIUM CHLORIDE 0.9 % IV SOLN
125.0000 mg | Freq: Once | INTRAVENOUS | Status: AC
  Administered 2019-02-26: 125 mg via INTRAVENOUS
  Filled 2019-02-26: qty 10

## 2019-02-26 NOTE — Patient Instructions (Signed)
Sodium Ferric Gluconate Complex injection What is this medicine? SODIUM FERRIC GLUCONATE COMPLEX (SOE dee um FER ik GLOO koe nate KOM pleks) is an iron replacement. It is used with epoetin therapy to treat low iron levels in patients who are receiving hemodialysis. This medicine may be used for other purposes; ask your health care provider or pharmacist if you have questions. COMMON BRAND NAME(S): Ferrlecit, Nulecit What should I tell my health care provider before I take this medicine? They need to know if you have any of the following conditions:  anemia that is not from iron deficiency  high levels of iron in the body  an unusual or allergic reaction to iron, benzyl alcohol, other medicines, foods, dyes, or preservatives  pregnant or are trying to become pregnant  breast-feeding How should I use this medicine? This medicine is for infusion into a vein. It is given by a health care professional in a hospital or clinic setting. Talk to your pediatrician regarding the use of this medicine in children. While this drug may be prescribed for children as Yaneisy Wenz as 6 years old for selected conditions, precautions do apply. Overdosage: If you think you have taken too much of this medicine contact a poison control center or emergency room at once. NOTE: This medicine is only for you. Do not share this medicine with others. What if I miss a dose? It is important not to miss your dose. Call your doctor or health care professional if you are unable to keep an appointment. What may interact with this medicine? Do not take this medicine with any of the following medications:  deferoxamine  dimercaprol  other iron products This medicine may also interact with the following medications:  chloramphenicol  deferasirox  medicine for blood pressure like enalapril This list may not describe all possible interactions. Give your health care provider a list of all the medicines, herbs,  non-prescription drugs, or dietary supplements you use. Also tell them if you smoke, drink alcohol, or use illegal drugs. Some items may interact with your medicine. What should I watch for while using this medicine? Your condition will be monitored carefully while you are receiving this medicine. Visit your doctor for check-ups as directed. What side effects may I notice from receiving this medicine? Side effects that you should report to your doctor or health care professional as soon as possible:  allergic reactions like skin rash, itching or hives, swelling of the face, lips, or tongue  breathing problems  changes in hearing  changes in vision  chills, flushing, or sweating  fast, irregular heartbeat  feeling faint or lightheaded, falls  fever, flu-like symptoms  high or low blood pressure  pain, tingling, numbness in the hands or feet  severe pain in the chest, back, flanks, or groin  swelling of the ankles, feet, hands  trouble passing urine or change in the amount of urine  unusually weak or tired Side effects that usually do not require medical attention (report to your doctor or health care professional if they continue or are bothersome):  cramps  dark colored stools  diarrhea  headache  nausea, vomiting  stomach upset This list may not describe all possible side effects. Call your doctor for medical advice about side effects. You may report side effects to FDA at 1-800-FDA-1088. Where should I keep my medicine? This drug is given in a hospital or clinic and will not be stored at home. NOTE: This sheet is a summary. It may not cover all   possible information. If you have questions about this medicine, talk to your doctor, pharmacist, or health care provider.  2020 Elsevier/Gold Standard (2008-03-19 15:58:57)  

## 2019-03-05 ENCOUNTER — Other Ambulatory Visit: Payer: Self-pay

## 2019-03-05 ENCOUNTER — Inpatient Hospital Stay: Payer: Medicare Other | Attending: Oncology

## 2019-03-05 VITALS — BP 146/56 | HR 56 | Temp 98.2°F | Resp 18

## 2019-03-05 DIAGNOSIS — I4892 Unspecified atrial flutter: Secondary | ICD-10-CM | POA: Insufficient documentation

## 2019-03-05 DIAGNOSIS — Z923 Personal history of irradiation: Secondary | ICD-10-CM | POA: Insufficient documentation

## 2019-03-05 DIAGNOSIS — Z7901 Long term (current) use of anticoagulants: Secondary | ICD-10-CM | POA: Diagnosis not present

## 2019-03-05 DIAGNOSIS — C61 Malignant neoplasm of prostate: Secondary | ICD-10-CM | POA: Insufficient documentation

## 2019-03-05 DIAGNOSIS — Z794 Long term (current) use of insulin: Secondary | ICD-10-CM | POA: Diagnosis not present

## 2019-03-05 DIAGNOSIS — Z79899 Other long term (current) drug therapy: Secondary | ICD-10-CM | POA: Diagnosis not present

## 2019-03-05 DIAGNOSIS — D649 Anemia, unspecified: Secondary | ICD-10-CM | POA: Diagnosis not present

## 2019-03-05 MED ORDER — SODIUM CHLORIDE 0.9 % IV SOLN
Freq: Once | INTRAVENOUS | Status: AC
  Administered 2019-03-05: 16:00:00 via INTRAVENOUS
  Filled 2019-03-05: qty 250

## 2019-03-05 MED ORDER — SODIUM CHLORIDE 0.9 % IV SOLN
125.0000 mg | Freq: Once | INTRAVENOUS | Status: AC
  Administered 2019-03-05: 16:00:00 125 mg via INTRAVENOUS
  Filled 2019-03-05: qty 10

## 2019-03-05 NOTE — Patient Instructions (Signed)
Sodium Ferric Gluconate Complex injection What is this medicine? SODIUM FERRIC GLUCONATE COMPLEX (SOE dee um FER ik GLOO koe nate KOM pleks) is an iron replacement. It is used with epoetin therapy to treat low iron levels in patients who are receiving hemodialysis. This medicine may be used for other purposes; ask your health care provider or pharmacist if you have questions. COMMON BRAND NAME(S): Ferrlecit, Nulecit What should I tell my health care provider before I take this medicine? They need to know if you have any of the following conditions:  anemia that is not from iron deficiency  high levels of iron in the body  an unusual or allergic reaction to iron, benzyl alcohol, other medicines, foods, dyes, or preservatives  pregnant or are trying to become pregnant  breast-feeding How should I use this medicine? This medicine is for infusion into a vein. It is given by a health care professional in a hospital or clinic setting. Talk to your pediatrician regarding the use of this medicine in children. While this drug may be prescribed for children as young as 6 years old for selected conditions, precautions do apply. Overdosage: If you think you have taken too much of this medicine contact a poison control center or emergency room at once. NOTE: This medicine is only for you. Do not share this medicine with others. What if I miss a dose? It is important not to miss your dose. Call your doctor or health care professional if you are unable to keep an appointment. What may interact with this medicine? Do not take this medicine with any of the following medications:  deferoxamine  dimercaprol  other iron products This medicine may also interact with the following medications:  chloramphenicol  deferasirox  medicine for blood pressure like enalapril This list may not describe all possible interactions. Give your health care provider a list of all the medicines, herbs,  non-prescription drugs, or dietary supplements you use. Also tell them if you smoke, drink alcohol, or use illegal drugs. Some items may interact with your medicine. What should I watch for while using this medicine? Your condition will be monitored carefully while you are receiving this medicine. Visit your doctor for check-ups as directed. What side effects may I notice from receiving this medicine? Side effects that you should report to your doctor or health care professional as soon as possible:  allergic reactions like skin rash, itching or hives, swelling of the face, lips, or tongue  breathing problems  changes in hearing  changes in vision  chills, flushing, or sweating  fast, irregular heartbeat  feeling faint or lightheaded, falls  fever, flu-like symptoms  high or low blood pressure  pain, tingling, numbness in the hands or feet  severe pain in the chest, back, flanks, or groin  swelling of the ankles, feet, hands  trouble passing urine or change in the amount of urine  unusually weak or tired Side effects that usually do not require medical attention (report to your doctor or health care professional if they continue or are bothersome):  cramps  dark colored stools  diarrhea  headache  nausea, vomiting  stomach upset This list may not describe all possible side effects. Call your doctor for medical advice about side effects. You may report side effects to FDA at 1-800-FDA-1088. Where should I keep my medicine? This drug is given in a hospital or clinic and will not be stored at home. NOTE: This sheet is a summary. It may not cover all   possible information. If you have questions about this medicine, talk to your doctor, pharmacist, or health care provider.  2020 Elsevier/Gold Standard (2008-03-19 15:58:57)  

## 2019-03-16 IMAGING — CR DG CHEST 2V
2 series · 2 of 2 positions shown · non-contrast
Comparison: PA and lateral chest 06/02/2018.

CLINICAL DATA: Patient status post CABG and aortic valve
replacement 05/30/2018.

EXAM:
CHEST - 2 VIEW

[w chest pa]
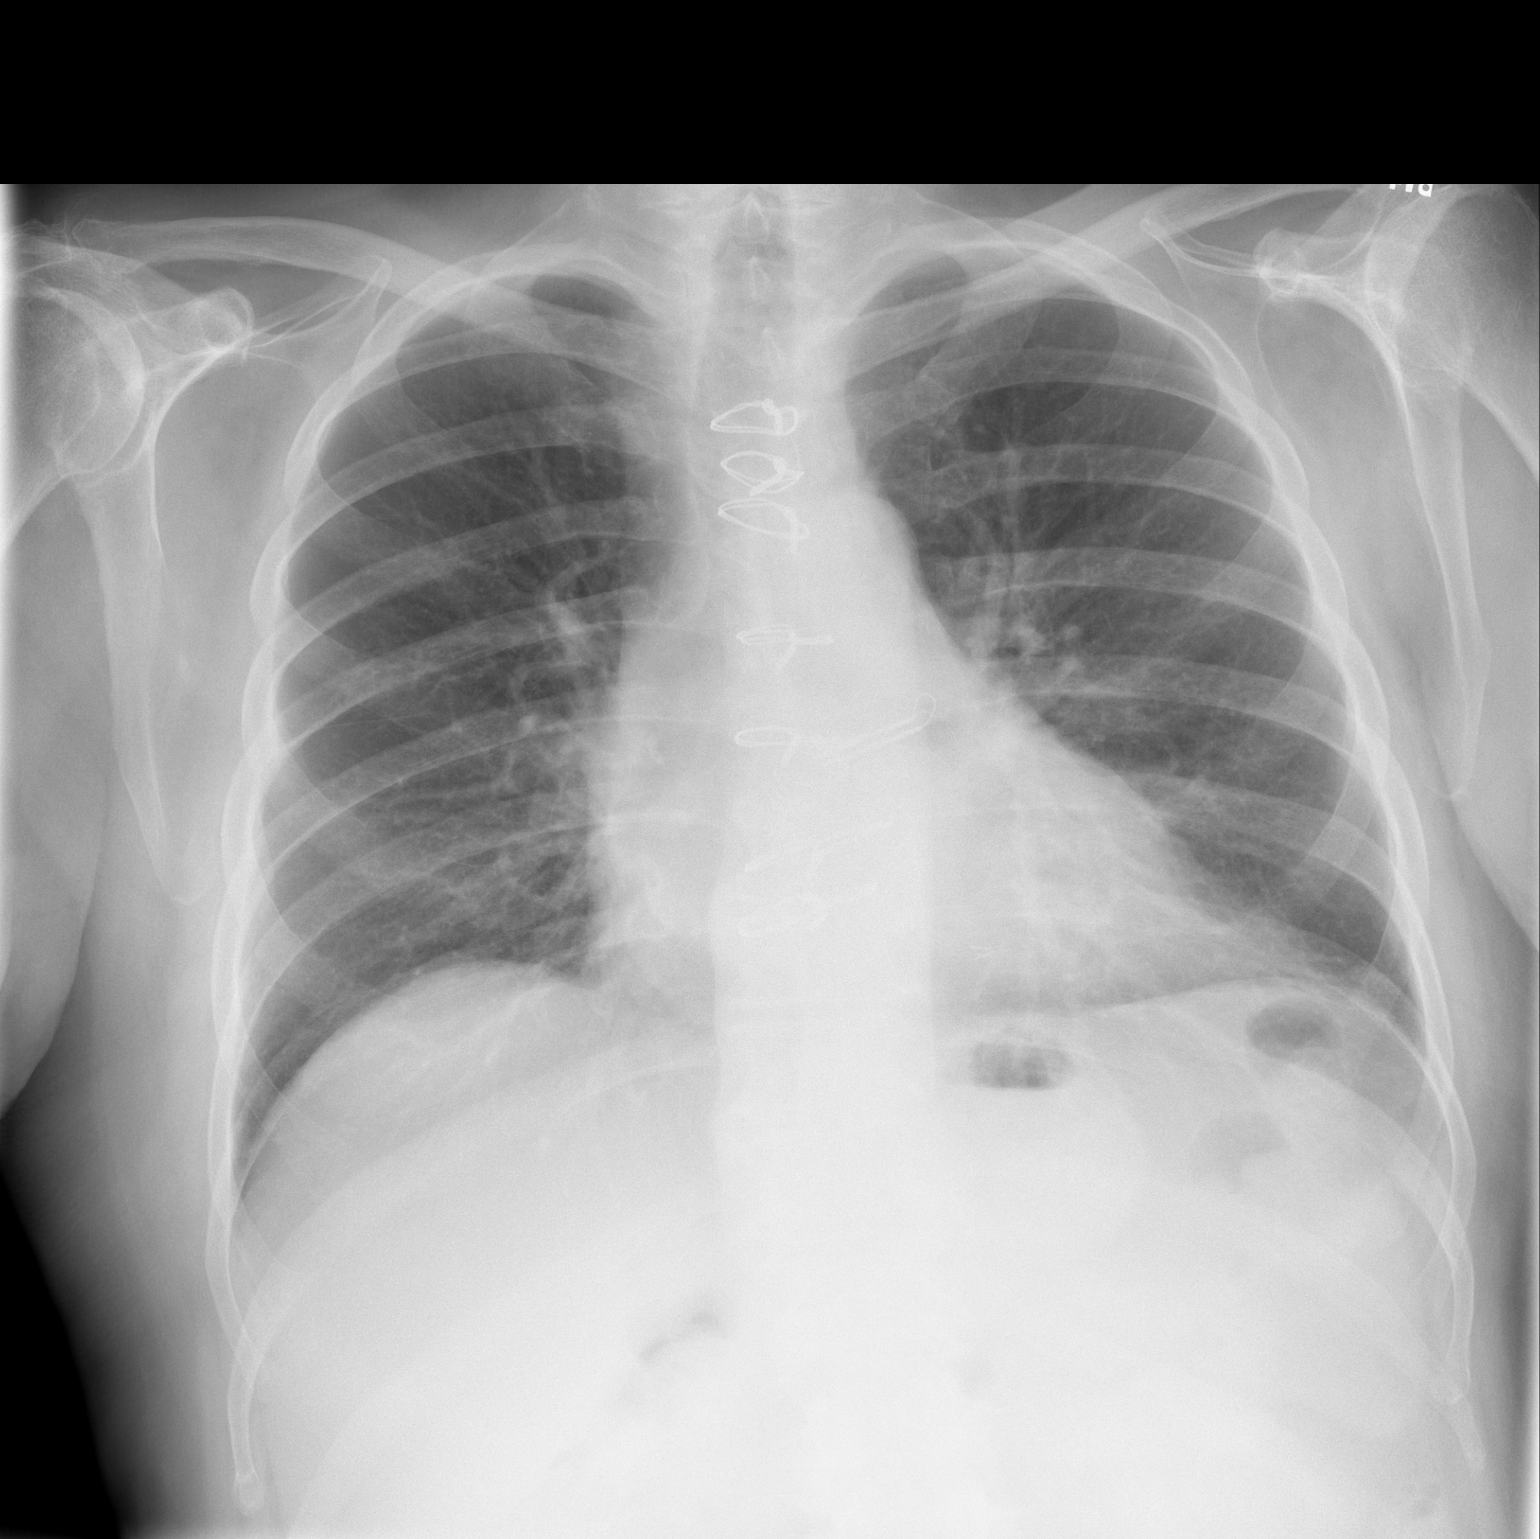

[w chest lat]
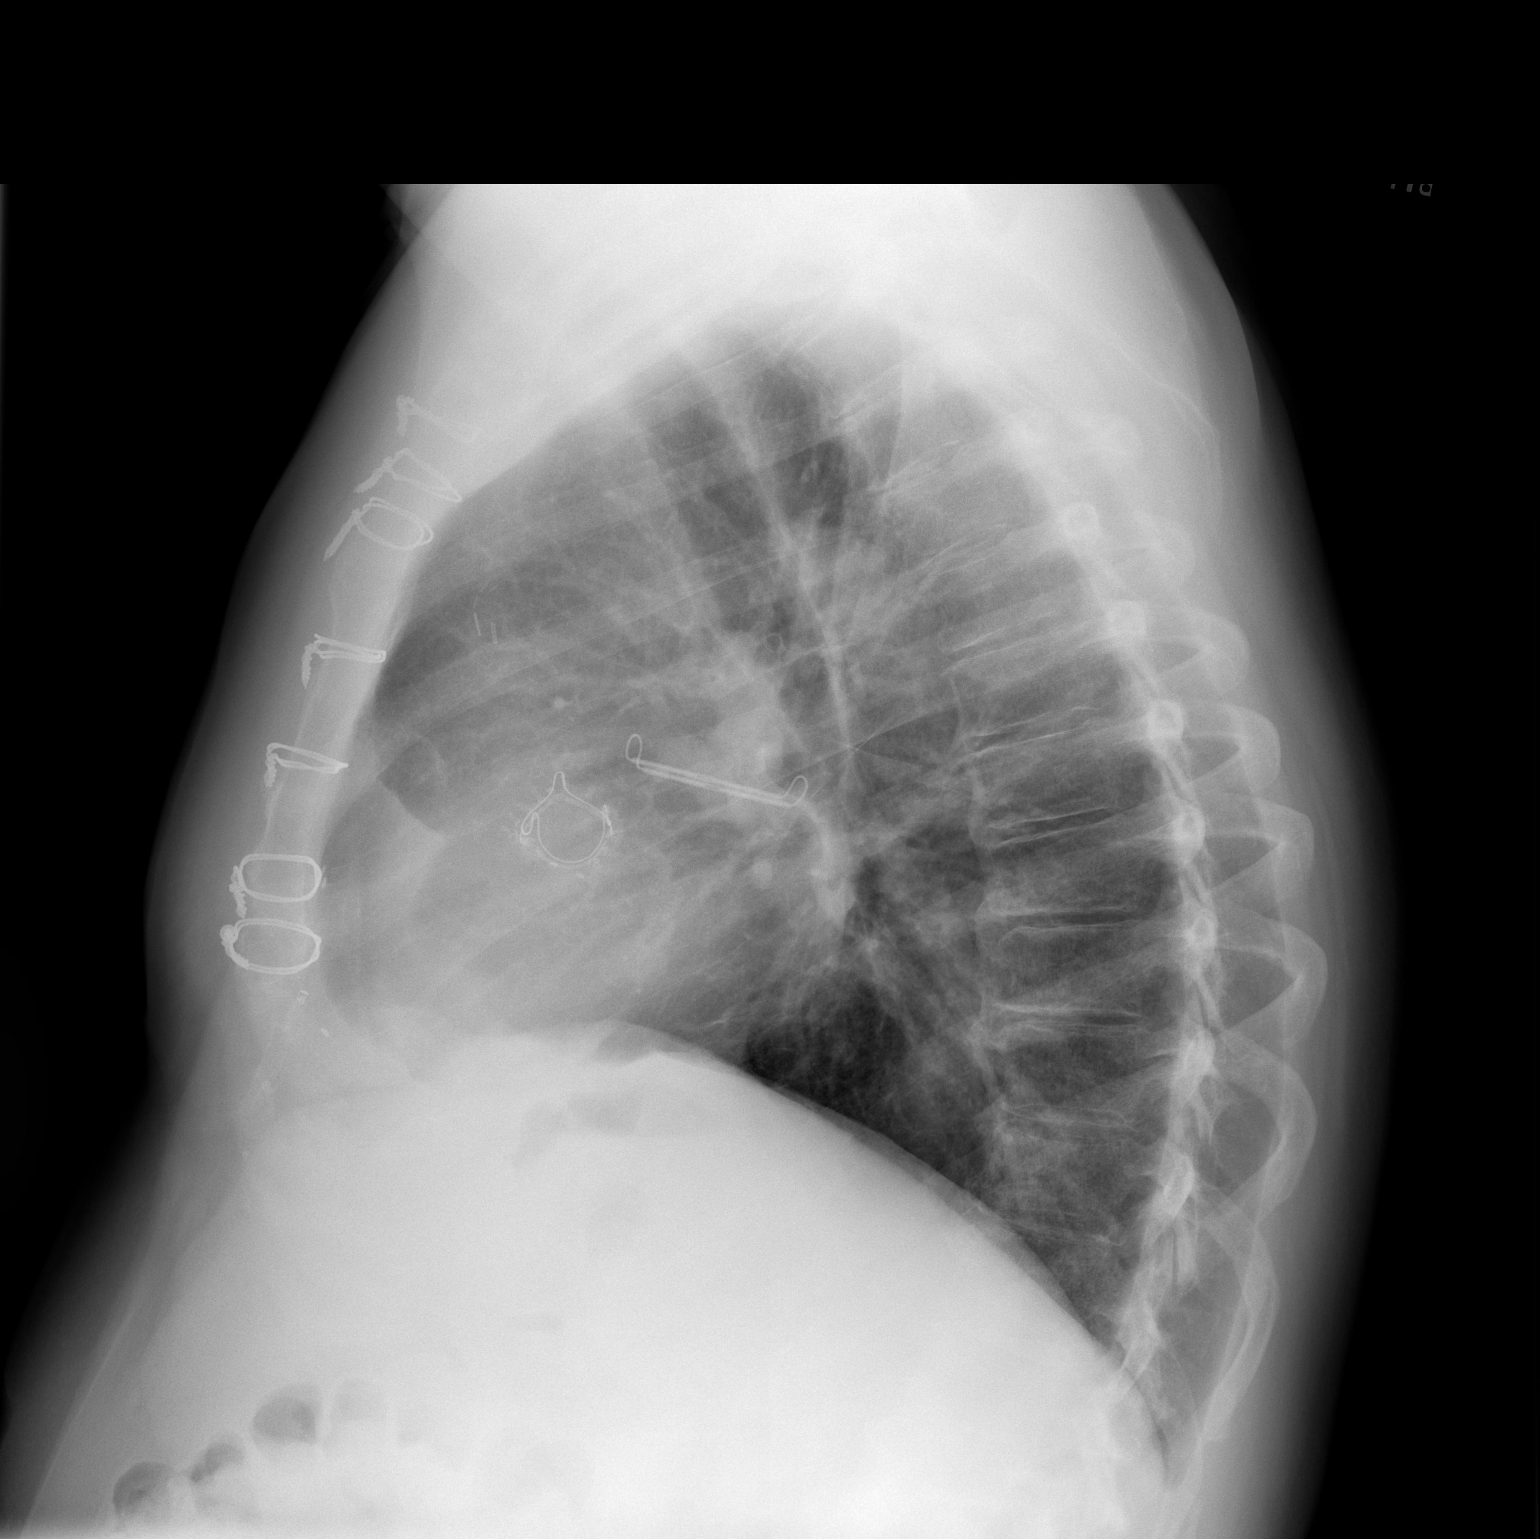

[2 of 2 positions shown; findings below may reference images not displayed]

FINDINGS: Seven intact median sternotomy wires are unchanged. Atrial appendage
clip and prosthetic aortic valve are noted. Small pleural effusions
seen on the prior examination have resolved. The lungs are clear. No
pneumothorax or pleural fluid. No acute or focal bony abnormality.
IMPRESSION: No acute disease.

Small pleural effusions seen on the prior exam have resolved.

## 2019-04-10 ENCOUNTER — Telehealth: Payer: Self-pay

## 2019-04-10 NOTE — Telephone Encounter (Signed)
-----   Message from Ariton sent at 02/25/2019  2:51 PM EDT ----- Check after back injections.needs a colonoscopy in Sept 2020. See 02-25-2019 office note

## 2019-04-10 NOTE — Telephone Encounter (Signed)
Called and spoke to Butch Penny (pts wife) he did have his back injections and seems to be doing somewhat better. However, he is currently being worked up for a problem with his eye. His eye is blurry and cant see well, He's under treatment with eye drops and following up today with the ophthalmologist . She requests to hold on the colonoscopy for now until this current matter has resolved. Self reminder message sent to self to check back in.

## 2019-04-15 NOTE — Telephone Encounter (Signed)
Thank you for the update.  We will wait to hear from them when he is ready.

## 2019-05-15 NOTE — Telephone Encounter (Signed)
Cody Phelps again declined to schedule colonoscopy. Self reminder sent for 1 month to follow up.

## 2019-05-15 NOTE — Telephone Encounter (Signed)
At that point, if still declines, then he can call us when ready.

## 2019-05-16 ENCOUNTER — Telehealth: Payer: Self-pay | Admitting: Oncology

## 2019-05-16 NOTE — Telephone Encounter (Signed)
FS Call Day 10/21. Appointment moved from 10/21 to 10/22. Confirmed with patient.  ° °

## 2019-05-22 ENCOUNTER — Inpatient Hospital Stay: Payer: Medicare Other | Admitting: Oncology

## 2019-05-22 ENCOUNTER — Inpatient Hospital Stay: Payer: Medicare Other

## 2019-05-23 ENCOUNTER — Inpatient Hospital Stay: Payer: Medicare Other

## 2019-05-23 ENCOUNTER — Inpatient Hospital Stay: Payer: Medicare Other | Admitting: Oncology

## 2019-05-28 ENCOUNTER — Other Ambulatory Visit: Payer: Self-pay

## 2019-05-28 ENCOUNTER — Inpatient Hospital Stay: Payer: Medicare Other | Attending: Oncology

## 2019-05-28 ENCOUNTER — Telehealth: Payer: Self-pay | Admitting: Oncology

## 2019-05-28 ENCOUNTER — Inpatient Hospital Stay (HOSPITAL_BASED_OUTPATIENT_CLINIC_OR_DEPARTMENT_OTHER): Payer: Medicare Other | Admitting: Oncology

## 2019-05-28 ENCOUNTER — Telehealth: Payer: Self-pay

## 2019-05-28 ENCOUNTER — Other Ambulatory Visit: Payer: Self-pay | Admitting: Oncology

## 2019-05-28 VITALS — BP 146/59 | HR 68 | Temp 98.5°F | Resp 17 | Ht 68.0 in | Wt 213.7 lb

## 2019-05-28 DIAGNOSIS — Z7901 Long term (current) use of anticoagulants: Secondary | ICD-10-CM | POA: Diagnosis not present

## 2019-05-28 DIAGNOSIS — Z794 Long term (current) use of insulin: Secondary | ICD-10-CM | POA: Diagnosis not present

## 2019-05-28 DIAGNOSIS — D649 Anemia, unspecified: Secondary | ICD-10-CM

## 2019-05-28 DIAGNOSIS — Z79899 Other long term (current) drug therapy: Secondary | ICD-10-CM | POA: Insufficient documentation

## 2019-05-28 DIAGNOSIS — I4892 Unspecified atrial flutter: Secondary | ICD-10-CM | POA: Diagnosis not present

## 2019-05-28 DIAGNOSIS — Z923 Personal history of irradiation: Secondary | ICD-10-CM | POA: Insufficient documentation

## 2019-05-28 DIAGNOSIS — C61 Malignant neoplasm of prostate: Secondary | ICD-10-CM | POA: Insufficient documentation

## 2019-05-28 LAB — CBC WITH DIFFERENTIAL (CANCER CENTER ONLY)
Abs Immature Granulocytes: 0.02 10*3/uL (ref 0.00–0.07)
Basophils Absolute: 0.1 10*3/uL (ref 0.0–0.1)
Basophils Relative: 1 %
Eosinophils Absolute: 0.3 10*3/uL (ref 0.0–0.5)
Eosinophils Relative: 5 %
HCT: 29.2 % — ABNORMAL LOW (ref 39.0–52.0)
Hemoglobin: 9.5 g/dL — ABNORMAL LOW (ref 13.0–17.0)
Immature Granulocytes: 0 %
Lymphocytes Relative: 13 %
Lymphs Abs: 0.7 10*3/uL (ref 0.7–4.0)
MCH: 28.3 pg (ref 26.0–34.0)
MCHC: 32.5 g/dL (ref 30.0–36.0)
MCV: 86.9 fL (ref 80.0–100.0)
Monocytes Absolute: 0.6 10*3/uL (ref 0.1–1.0)
Monocytes Relative: 11 %
Neutro Abs: 3.7 10*3/uL (ref 1.7–7.7)
Neutrophils Relative %: 70 %
Platelet Count: 145 10*3/uL — ABNORMAL LOW (ref 150–400)
RBC: 3.36 MIL/uL — ABNORMAL LOW (ref 4.22–5.81)
RDW: 15.4 % (ref 11.5–15.5)
WBC Count: 5.2 10*3/uL (ref 4.0–10.5)
nRBC: 0 % (ref 0.0–0.2)

## 2019-05-28 LAB — IRON AND TIBC
Iron: 35 ug/dL — ABNORMAL LOW (ref 42–163)
Saturation Ratios: 11 % — ABNORMAL LOW (ref 20–55)
TIBC: 314 ug/dL (ref 202–409)
UIBC: 279 ug/dL (ref 117–376)

## 2019-05-28 LAB — FERRITIN: Ferritin: 120 ng/mL (ref 24–336)

## 2019-05-28 NOTE — Progress Notes (Signed)
Hematology and Oncology Follow Up Visit  Cody Phelps [id number] 30-Mar-1941 78 y.o. 05/28/2019 9:53 AM Cody Phelps, MDSouth, Cody Main, MD   Principle Diagnosis: 78 year old man with:  1. Prostate cancer diagnosed in 2018 with Gleason score of 4+4 = 8 and a PSA 4.4.  His disease that was localized without any evidence of metastasis.  2.  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   Prior Therapy:  Definitive radiation therapy utilizing IMRT between August 15, 2017 and September 18, 2017.  He also received brachytherapy boost upfront on January 4 of 2019.  He received 45 Gy in 25 fraction to supplement his post implant of 110 Gy.  He is completing 2 years of androgen deprivation therapy under the care of Dr. Junious Phelps.  He status post IV iron infusion in August 2019.  Current therapy: Active surveillance.  Interim History: Cody Phelps is here for a follow-up.  Since the last visit, he reports no major changes in his health.  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  This has affected his ambulation although overall feels reasonably fair.  He does report some fatigue and some tiredness although his energy did improve after IV iron infusion in the past.  He also completed the androgen deprivation therapy at this time.  He does report occasional hematochezia associated with his hemorrhoids.  He denied hemoptysis or melena emesis.   Patient denied headaches, blurry vision, syncope or seizures.  Denies any fevers, chills or sweats.  Denied chest pain, palpitation, orthopnea or leg edema.  Denied cough, wheezing or hemoptysis.  Denied nausea, vomiting or abdominal pain.  Denies any constipation or diarrhea.  Denies any frequency urgency or hesitancy.  Denies any arthralgias or myalgias.  Denies any skin rashes or lesions.  Denies any bleeding or clotting tendency.  Denies any easy bruising.  Denies any hair or nail  changes.  Denies any anxiety or depression.  Remaining review of system is negative.        Medications: Updated without any changes. Current Outpatient Medications  Medication Sig Dispense Refill  . acetaminophen (TYLENOL) 325 MG tablet Take 1 tablet (325 mg total) by mouth every 4 (four) hours as needed (headache, pain). 60 tablet 3  . atorvastatin (LIPITOR) 80 MG tablet Take 1 tablet (80 mg total) by mouth daily at 6 PM. (Patient taking differently: Take 80 mg by mouth every evening. 1/2 tab daily) 60 tablet 3  . colesevelam (WELCHOL) 625 MG tablet Take 625 mg by mouth 3 (three) times daily.     Marland Kitchen donepezil (ARICEPT) 10 MG tablet Take 10 mg by mouth every morning.     Marland Kitchen esomeprazole (NEXIUM) 40 MG capsule Take 40 mg by mouth daily.     . insulin glargine (LANTUS SOLOSTAR) 100 UNIT/ML injection Inject 20-40 Units into the skin at bedtime as needed (for BGL >150).     . memantine (NAMENDA) 5 MG tablet Take 5 mg by mouth daily.     . metoprolol tartrate (LOPRESSOR) 25 MG tablet TAKE 1 TABLET BY MOUTH  DAILY (Patient taking differently: 12.5 mg 2 (two) times a day. ) 90 tablet 3  . mirabegron ER (MYRBETRIQ) 50 MG TB24 tablet Take 50 mg by mouth every evening.     Marland Kitchen omega-3 acid ethyl esters (LOVAZA) 1 G capsule Take 2 g by mouth daily.     . Rivaroxaban (XARELTO) 15 MG TABS tablet Take 15 mg  by mouth daily.     Marland Kitchen rOPINIRole (REQUIP) 2 MG tablet Take 2 mg by mouth as needed. 1600 & at bedtime    . spironolactone (ALDACTONE) 25 MG tablet TAKE ONE-HALF TABLET BY  MOUTH DAILY 45 tablet 2  . tamsulosin (FLOMAX) 0.4 MG CAPS capsule Take 0.4 mg by mouth daily after supper.     . torsemide (DEMADEX) 20 MG tablet Take 1 tablet (20 mg total) by mouth daily. (Patient taking differently: Take 20 mg by mouth as needed. ) 30 tablet 1  . valsartan-hydrochlorothiazide (DIOVAN-HCT) 80-12.5 MG tablet TAKE 1 TABLET BY MOUTH  EVERY MORNING 90 tablet 1   No current facility-administered medications for this  visit.      Allergies: No Known Allergies  Past Medical History, Surgical history, Social history, and Family History unchanged on review.    Physical Exam: Blood pressure (!) 146/59, pulse 68, temperature 98.5 F (36.9 C), temperature source Oral, resp. rate 17, height 5\' 8"  (1.727 m), weight 213 lb 11.2 oz (96.9 kg), SpO2 98 %.    ECOG: 1    General appearance: Alert, awake without any distress. Head: Atraumatic without abnormalities Oropharynx: Without any thrush or ulcers. Eyes: No scleral icterus. Lymph nodes: No lymphadenopathy noted in the cervical, supraclavicular, or axillary nodes Heart:regular rate and rhythm, without any murmurs or gallops.   Lung: Clear to auscultation without any rhonchi, wheezes or dullness to percussion. Abdomin: Soft, nontender without any shifting dullness or ascites. Musculoskeletal: No clubbing or cyanosis. Neurological: No motor or sensory deficits. Skin: No rashes or lesions.     Lab Results: Lab Results  Component Value Date   WBC 6.2 02/26/2019   HGB 9.5 (L) 02/26/2019   HCT 29.4 (L) 02/26/2019   MCV 85.5 02/26/2019   PLT 168 02/26/2019     Chemistry      Component Value Date/Time   NA 136 06/02/2018 0724   K 4.0 06/02/2018 0724   CL 110 06/02/2018 0724   CO2 21 (L) 06/02/2018 0724   BUN 19 06/02/2018 0724   CREATININE 0.90 06/02/2018 0724      Component Value Date/Time   CALCIUM 8.0 (L) 06/02/2018 0724   ALKPHOS 47 05/28/2018 1345   AST 18 05/28/2018 1345   ALT 23 05/28/2018 1345   BILITOT 0.4 05/28/2018 1345          Impression and Plan:  78 year old man with:    1.  Multifactorial anemia related to iron deficiency as well as chronic disease.     Iron studies obtained in July 2020 showed continues to be deficient level of iron although is improving with saturation of 13% and iron level of 41.  Ferritin remains adequate.  Risks and benefits of oral iron therapy versus intravenous iron replacement was  discussed.  Potential complications associated with IV iron including arthralgias, myalgias as well as infusion related complications.   We will await iron studies at this time and proceed with intravenous iron if needed.   2.  Early stage prostate cancer without any evidence of metastatic disease.  He has completed radiation therapy as well as androgen deprivation currently under the care of Dr. Junious Phelps.  He has a follow-up evaluation with a repeat PSA in the near future.  3.  Atrial flutter: Continues to follow with cardiology regarding this issue.  4.  Age-appropriate cancer screening: He is due for colonoscopy which I recommended he gets given his recurrent iron deficiency.  5.  Follow-up: We will be in 3  months for repeat evaluation.  25  minutes was spent with the patient face-to-face today.  More than 50% of time was dedicated to reviewing labs today, differential diagnosis and possible management options.   Zola Button, MD 10/27/20209:53 AM

## 2019-05-28 NOTE — Telephone Encounter (Signed)
-----   Message from Wyatt Portela, MD sent at 05/28/2019 11:47 AM EDT ----- Please let him know his Iron still low. Will arrange for IV iron next week.

## 2019-05-28 NOTE — Telephone Encounter (Signed)
Called and informed patient of information in note below.  Patient informed he will be contacted by our scheduling department with his infusion appointment dates and times. Patient verbalized understanding.

## 2019-05-28 NOTE — Telephone Encounter (Signed)
Scheduled appt per 10/27 sch message - unable to reach pt with appts. Left message with appt date and time

## 2019-05-28 NOTE — Telephone Encounter (Signed)
Scheduled appt per 10/27 los. ° °Sent a staff message to get a calendar mailed out. °

## 2019-06-04 ENCOUNTER — Telehealth: Payer: Self-pay | Admitting: Oncology

## 2019-06-04 ENCOUNTER — Inpatient Hospital Stay: Payer: Medicare Other | Attending: Oncology

## 2019-06-04 ENCOUNTER — Other Ambulatory Visit: Payer: Self-pay

## 2019-06-04 DIAGNOSIS — D508 Other iron deficiency anemias: Secondary | ICD-10-CM | POA: Insufficient documentation

## 2019-06-04 NOTE — Progress Notes (Signed)
After multiple attempts to obtain IV with no success, patient requested to leave without treatment and reschedule appointment. Dr. [name] updated and agreed with plan.

## 2019-06-04 NOTE — Progress Notes (Signed)
Attempted IV sticks x 2 unsuccessful.  Catheters d/c intact, sites unremarkable.  Pt voiced no complaints.

## 2019-06-04 NOTE — Telephone Encounter (Signed)
Scheduled appt per 1/13 sch message - pt aware of appt date and time   

## 2019-06-11 ENCOUNTER — Inpatient Hospital Stay: Payer: Medicare Other

## 2019-06-11 ENCOUNTER — Other Ambulatory Visit: Payer: Self-pay | Admitting: Oncology

## 2019-06-11 ENCOUNTER — Other Ambulatory Visit: Payer: Self-pay

## 2019-06-11 VITALS — BP 137/59 | HR 50 | Temp 98.1°F | Resp 16

## 2019-06-11 DIAGNOSIS — D508 Other iron deficiency anemias: Secondary | ICD-10-CM | POA: Diagnosis present

## 2019-06-11 DIAGNOSIS — D649 Anemia, unspecified: Secondary | ICD-10-CM

## 2019-06-11 MED ORDER — SODIUM CHLORIDE 0.9 % IV SOLN
510.0000 mg | Freq: Once | INTRAVENOUS | Status: AC
  Administered 2019-06-11: 510 mg via INTRAVENOUS
  Filled 2019-06-11: qty 17

## 2019-06-11 MED ORDER — SODIUM CHLORIDE 0.9 % IV SOLN
Freq: Once | INTRAVENOUS | Status: AC
  Administered 2019-06-11: 09:00:00 via INTRAVENOUS
  Filled 2019-06-11: qty 250

## 2019-06-11 NOTE — Patient Instructions (Signed)
Ferumoxytol injection What is this medicine? FERUMOXYTOL is an iron complex. Iron is used to make healthy red blood cells, which carry oxygen and nutrients throughout the body. This medicine is used to treat iron deficiency anemia. This medicine may be used for other purposes; ask your health care provider or pharmacist if you have questions. COMMON BRAND NAME(S): Feraheme What should I tell my health care provider before I take this medicine? They need to know if you have any of these conditions:  anemia not caused by low iron levels  high levels of iron in the blood  magnetic resonance imaging (MRI) test scheduled  an unusual or allergic reaction to iron, other medicines, foods, dyes, or preservatives  pregnant or trying to get pregnant  breast-feeding How should I use this medicine? This medicine is for injection into a vein. It is given by a health care professional in a hospital or clinic setting. Talk to your pediatrician regarding the use of this medicine in children. Special care may be needed. Overdosage: If you think you have taken too much of this medicine contact a poison control center or emergency room at once. NOTE: This medicine is only for you. Do not share this medicine with others. What if I miss a dose? It is important not to miss your dose. Call your doctor or health care professional if you are unable to keep an appointment. What may interact with this medicine? This medicine may interact with the following medications:  other iron products This list may not describe all possible interactions. Give your health care provider a list of all the medicines, herbs, non-prescription drugs, or dietary supplements you use. Also tell them if you smoke, drink alcohol, or use illegal drugs. Some items may interact with your medicine. What should I watch for while using this medicine? Visit your doctor or healthcare professional regularly. Tell your doctor or healthcare  professional if your symptoms do not start to get better or if they get worse. You may need blood work done while you are taking this medicine. You may need to follow a special diet. Talk to your doctor. Foods that contain iron include: whole grains/cereals, dried fruits, beans, or peas, leafy green vegetables, and organ meats (liver, kidney). What side effects may I notice from receiving this medicine? Side effects that you should report to your doctor or health care professional as soon as possible:  allergic reactions like skin rash, itching or hives, swelling of the face, lips, or tongue  breathing problems  changes in blood pressure  feeling faint or lightheaded, falls  fever or chills  flushing, sweating, or hot feelings  swelling of the ankles or feet Side effects that usually do not require medical attention (report to your doctor or health care professional if they continue or are bothersome):  diarrhea  headache  nausea, vomiting  stomach pain This list may not describe all possible side effects. Call your doctor for medical advice about side effects. You may report side effects to FDA at 1-800-FDA-1088. Where should I keep my medicine? This drug is given in a hospital or clinic and will not be stored at home. NOTE: This sheet is a summary. It may not cover all possible information. If you have questions about this medicine, talk to your doctor, pharmacist, or health care provider.  2020 Elsevier/Gold Standard (2016-09-05 20:21:10) Coronavirus (COVID-19) Are you at risk?  Are you at risk for the Coronavirus (COVID-19)?  To be considered HIGH RISK for Coronavirus (COVID-19),   you have to meet the following criteria:  . Traveled to [place], [place], [place], [place] or [place]; or in the [place] to [place], [place], [place], or [place]; and have fever, cough, and shortness of breath within the last 2 weeks of travel OR . Been in close contact with a person  diagnosed with COVID-19 within the last 2 weeks and have fever, cough, and shortness of breath . IF YOU DO NOT MEET THESE CRITERIA, YOU ARE CONSIDERED LOW RISK FOR COVID-19.  What to do if you are HIGH RISK for COVID-19?  . If you are having a medical emergency, call 911. . Seek medical care right away. Before you go to a doctor's office, urgent care or emergency department, call ahead and tell them about your recent travel, contact with someone diagnosed with COVID-19, and your symptoms. You should receive instructions from your physician's office regarding next steps of care.  . When you arrive at healthcare provider, tell the healthcare staff immediately you have returned from visiting [place], [place], [place], [place] or [place]; or traveled in the [place] to [place], [place], [place], or [place]; in the last two weeks or you have been in close contact with a person diagnosed with COVID-19 in the last 2 weeks.   . Tell the health care staff about your symptoms: fever, cough and shortness of breath. . After you have been seen by a medical provider, you will be either: o Tested for (COVID-19) and discharged home on quarantine except to seek medical care if symptoms worsen, and asked to  - Stay home and avoid contact with others until you get your results (4-5 days)  - Avoid travel on public transportation if possible (such as bus, train, or airplane) or o Sent to the Emergency Department by EMS for evaluation, COVID-19 testing, and possible admission depending on your condition and test results.  What to do if you are LOW RISK for COVID-19?  Reduce your risk of any infection by using the same precautions used for avoiding the common cold or flu:  . Wash your hands often with soap and warm water for at least 20 seconds.  If soap and water are not readily available, use an alcohol-based hand sanitizer with at least 60% alcohol.  . If coughing or sneezing, cover your mouth and nose by  coughing or sneezing into the elbow areas of your shirt or coat, into a tissue or into your sleeve (not your hands). . Avoid shaking hands with others and consider head nods or verbal greetings only. . Avoid touching your eyes, nose, or mouth with unwashed hands.  . Avoid close contact with people who are sick. . Avoid places or events with large numbers of people in one location, like concerts or sporting events. . Carefully consider travel plans you have or are making. . If you are planning any travel outside or inside the [place], visit the CDC's Travelers' Health webpage for the latest health notices. . If you have some symptoms but not all symptoms, continue to monitor at home and seek medical attention if your symptoms worsen. . If you are having a medical emergency, call 911.   ADDITIONAL HEALTHCARE OPTIONS FOR PATIENTS  [facility] / e-Visit: [email or web address]         [facility]: [phone]  [facility]: [phone]                   [facility]: [phone]   

## 2019-06-12 ENCOUNTER — Encounter (INDEPENDENT_AMBULATORY_CARE_PROVIDER_SITE_OTHER): Payer: Self-pay | Admitting: Ophthalmology

## 2019-06-12 ENCOUNTER — Ambulatory Visit (INDEPENDENT_AMBULATORY_CARE_PROVIDER_SITE_OTHER): Payer: Medicare Other | Admitting: Ophthalmology

## 2019-06-12 ENCOUNTER — Other Ambulatory Visit: Payer: Self-pay

## 2019-06-12 ENCOUNTER — Telehealth: Payer: Self-pay | Admitting: *Deleted

## 2019-06-12 ENCOUNTER — Encounter (HOSPITAL_COMMUNITY): Payer: Self-pay | Admitting: *Deleted

## 2019-06-12 DIAGNOSIS — Z961 Presence of intraocular lens: Secondary | ICD-10-CM

## 2019-06-12 DIAGNOSIS — H3581 Retinal edema: Secondary | ICD-10-CM

## 2019-06-12 DIAGNOSIS — H35411 Lattice degeneration of retina, right eye: Secondary | ICD-10-CM | POA: Diagnosis not present

## 2019-06-12 DIAGNOSIS — T8522XS Displacement of intraocular lens, sequela: Secondary | ICD-10-CM

## 2019-06-12 DIAGNOSIS — I10 Essential (primary) hypertension: Secondary | ICD-10-CM

## 2019-06-12 DIAGNOSIS — Z9889 Other specified postprocedural states: Secondary | ICD-10-CM | POA: Diagnosis not present

## 2019-06-12 DIAGNOSIS — H35033 Hypertensive retinopathy, bilateral: Secondary | ICD-10-CM

## 2019-06-12 DIAGNOSIS — H3321 Serous retinal detachment, right eye: Secondary | ICD-10-CM

## 2019-06-12 DIAGNOSIS — E119 Type 2 diabetes mellitus without complications: Secondary | ICD-10-CM
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
## 2019-06-12 NOTE — Telephone Encounter (Signed)
Mrs Aldama called to cancel IV iron infusion. Pt has a detatched retina and is having surgery Thursday. Will call back to reschedule

## 2019-06-12 NOTE — Progress Notes (Addendum)
[facility] Note  06/12/2019     CHIEF COMPLAINT Patient presents for Retina Evaluation   HISTORY OF PRESENT ILLNESS: Cody Phelps is a 78 y.o. male who presents to the clinic today for:   HPI    Retina Evaluation    In right eye.  Associated Symptoms Distortion, Blind Spot and Floaters.  Negative for Flashes, Pain, Photophobia, Trauma, Jaw Claudication, Fever, Fatigue, Weight Loss, Shoulder/Hip pain, Scalp Tenderness, Glare and Redness.  Context:  distance vision, mid-range vision and near vision.  Treatments tried include no treatments.  I, the attending physician,  performed the HPI with the patient and updated documentation appropriately.          Comments    Pt states over the weekend he noticed a shadow in his temporal vision OD, he states he thought it was floaters, but it has continued to get bigger, he says it is now in his center vision, pt did not notice any fol, he denies trauma or pain, pt saw Dr. Parke Simmers this morning and was told he has a detachment, after checking pts vision, he states he feels like it has gotten worse since this morning       Last edited by [name], MD on 06/12/2019 11:06 PM. (History)    pt is here on the referral of Dr. Parke Simmers for RD OD, pt had cataract sx with Dr. [name] back in the early 2000's, he states his lens is now loose and he is on PF QD for pigment deposition, pt states he initially thought he was having a problem with his lens when his he started having problems with his vision, he notes over the weekend he started to see a shadow in his peripheral vision OD, he says it has gotten worse since then, pt saw Dr. Parke Simmers this morning where his vision was 20/80, but states when he looked at his wife this morning he could see her face, but nothing from the neck down, he states now he cannot see her face either  Referring physician: [place], [place], [address] [place],  [place]  [id number]  HISTORICAL INFORMATION:   Selected notes from the [place] Referred by Dr. [name] for concern of RD OD LEE: 11.11.20 (J. 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 prednisoLONE acetate (PRED FORTE) 1 % ophthalmic suspension Place 1 drop into the right eye daily.   No current facility-administered medications for this visit.  (Ophthalmic Drugs)   Current Outpatient Medications (Other)  Medication Sig  . acetaminophen (TYLENOL) 325 MG tablet Take 1 tablet (325 mg total) by mouth every 4 (four) hours as needed (headache, pain).  Marland Kitchen amiodarone (PACERONE) 200 MG tablet Take 200 mg by mouth daily.  Marland Kitchen atorvastatin (LIPITOR) 80 MG tablet Take 1 tablet (80 mg total) by mouth daily at 6 PM. (Patient taking differently: Take 40 mg by mouth every evening. )  . colesevelam (WELCHOL) 625 MG tablet Take 625 mg by mouth 3 (three) times daily.   Marland Kitchen donepezil (ARICEPT) 10 MG tablet Take 10 mg by mouth every morning.   . fluticasone (FLONASE) 50 MCG/ACT nasal spray Place 1 spray into both nostrils 3 times/day as needed-between meals & bedtime for allergies or rhinitis.  Marland Kitchen insulin glargine (LANTUS SOLOSTAR) 100 UNIT/ML  injection Inject 20-40 Units into the skin at bedtime as needed (for BGL >150).   . memantine (NAMENDA) 5 MG tablet Take 5 mg by mouth daily.   . metoprolol tartrate (LOPRESSOR) 25 MG tablet TAKE 1 TABLET BY MOUTH  DAILY (Patient taking differently: 12.5 mg 2 (two) times a day. )  . mirabegron ER (MYRBETRIQ) 50 MG TB24 tablet Take 50 mg by mouth every evening.   Marland Kitchen omega-3 acid ethyl esters (LOVAZA) 1 G capsule Take 2 g by mouth daily.   . Rivaroxaban (XARELTO) 15 MG TABS tablet Take 15 mg by mouth daily.   Marland Kitchen rOPINIRole (REQUIP) 2 MG tablet Take 2 mg by mouth 2 (two) times daily  as needed (RLS). 1600 & at bedtime  . spironolactone (ALDACTONE) 25 MG tablet TAKE ONE-HALF TABLET BY  MOUTH DAILY (Patient taking differently: Take 12.5 mg by mouth daily. )  . tamsulosin (FLOMAX) 0.4 MG CAPS capsule Take 0.4 mg by mouth daily after supper.   . valsartan-hydrochlorothiazide (DIOVAN-HCT) 80-12.5 MG tablet TAKE 1 TABLET BY MOUTH  EVERY MORNING  . vitamin B-12 (CYANOCOBALAMIN) 1000 MCG tablet Take 1,000 mcg by mouth 2 (two) times daily.   No current facility-administered medications for this visit.  (Other)      REVIEW OF SYSTEMS: ROS    Positive for: Endocrine, Cardiovascular, Eyes, Respiratory   Negative for: Constitutional, Gastrointestinal, Neurological, Skin, Genitourinary, Musculoskeletal, HENT, Psychiatric, Allergic/Imm, Heme/Lymph   Last edited by Debbrah Alar, COT on 06/12/2019 12:12 PM. (History)       ALLERGIES No Known Allergies  PAST MEDICAL HISTORY Past Medical History:  Diagnosis Date  . Acute anterior wall MI ([place]) 03/01/2018  . Acute combined systolic and diastolic heart failure ([place]) 03/15/2018  . Bronchitis, acute 05/04/2012  . Burn   . Carotid stenosis, left followed by dr [name], cardiologist--- bilateral bruit per note   per dr [name] note 65/0354  LICA 65-68% stenosis per duplex 05-14-2014  . Cataracts, bilateral    surgery to remove cataracts  . Chronic diastolic CHF (congestive heart failure) ([place]) 09/21/2018  . Dementia ([place])   . Diverticulosis   . DM (diabetes mellitus) type 2, uncontrolled, with ketoacidosis ([place]) 11/25/2013  . ED (erectile dysfunction)   . First degree heart block   . GERD (gastroesophageal reflux disease)   . Heart murmur   . History of blood transfusion    with open heart surgery  . Hypertension    cardiologsit-  dr [name]  . Iron deficiency anemia   . Mixed hyperlipidemia   . Morbid obesity ([place])   . Myocardial infarction ([place])    02/28/18  . OA (osteoarthritis)    right hip  . OSA on CPAP    followed by dr  dohmeier, uses CPAP  . PAF (paroxysmal atrial fibrillation) ([name])    a. on Eliquis  . Prostate cancer ([place])    dx 03-17-2017 (bx)  Stage T2a, Gleason 4+4, PSA  4.43, vol 32.32--  s/p radiatctive prostate seed implants 07-10-2017 then IMRT and ADT  . RLS (restless legs syndrome)   . S/P aortic valve replacement with bioprosthetic valve 05/30/2018   23 mm Edwards Inspiris Resilia stented bovine pericardial tissue valve  . 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 S/P Maze operation for atrial fibrillation 05/30/2018   Complete bilateral atrial lesion set using bipolar radiofrequency and cryothermy ablation with clipping of LA appendage  . Scoliosis   . Severe aortic stenosis   .  Trigger finger   . Type 2 diabetes mellitus ([place])   . Wears partial dentures    upper and lower   Past Surgical History:  Procedure Laterality Date  . AORTIC VALVE REPLACEMENT N/A 05/30/2018   Procedure: AORTIC VALVE REPLACEMENT (AVR) using Inspiris Valve, Size 23;  Surgeon: [name], MD;  Location: [place];  Service: Open Heart Surgery;  Laterality: N/A;  . APPENDECTOMY  1988  . BILATERAL TOTAL ETHMOIDECTOMY AND SPHENOIDECTOMY  01-14-2009    dr bates   [facility]  . CARDIOVERSION N/A 08/10/2018   Procedure: CARDIOVERSION;  Surgeon: Adrian Prows, MD;  Location: [place];  Service: Cardiovascular;  Laterality: N/A;  . CARPAL TUNNEL RELEASE Bilateral 2013  . CATARACT EXTRACTION W/ INTRAOCULAR LENS  IMPLANT, BILATERAL  date?  . CORONARY ARTERY BYPASS GRAFT N/A 05/30/2018   Procedure: CORONARY ARTERY BYPASS GRAFTING (CABG) x one using left internal mammary artery;  Surgeon: [name], MD;  Location: [place];  Service: Open Heart Surgery;  Laterality: N/A;  . CORONARY BALLOON ANGIOPLASTY N/A 03/01/2018   Procedure: CORONARY BALLOON ANGIOPLASTY;  Surgeon: Adrian Prows, MD;  Location: [facility] [facility];  Service: Cardiovascular;  Laterality: N/A;  . CORONARY/GRAFT ACUTE MI REVASCULARIZATION N/A 03/01/2018    Procedure: Coronary/Graft Acute MI Revascularization;  Surgeon: Adrian Prows, MD;  Location: [facility];  Service: Cardiovascular;  Laterality: N/A;  . CYSTOSCOPY  07/19/2017   Procedure: CYSTOSCOPY;  Surgeon: Nickie Retort, MD;  Location: [place] [facility];  Service: Urology;;  No seeds found in [place]  . LAMINECTOMY WITH POSTERIOR LATERAL ARTHRODESIS LEVEL 2 N/A 02/12/2014   Procedure: LUMBAR TWO-THREE,LUIMBAR THREE-FOUR LAMINECTOMY/FORAMINOTOMY;POSSIBLE POSTEROLATERAL ARTHRODESIS WITH AUTOGRAFT;  Surgeon: [name], MD;  Location: [facility];  Service: Neurosurgery;  Laterality: N/A;  . LEFT HEART CATH AND CORONARY ANGIOGRAPHY N/A 03/01/2018   Procedure: LEFT HEART CATH AND CORONARY ANGIOGRAPHY;  Surgeon: Adrian Prows, MD;  Location: [facility];  Service: Cardiovascular;  Laterality: N/A;  . MAZE N/A 05/30/2018   Procedure: MAZE;  Surgeon: [name], MD;  Location: [place];  Service: Open Heart Surgery;  Laterality: N/A;  . ORIF RIGHT ANKLE FX'S  05/05/2001   retained hardware  . PROSTATE BIOPSY  03-17-2017   dr Pilar Jarvis office  . RADIOACTIVE SEED IMPLANT N/A 07/19/2017   Procedure: RADIOACTIVE SEED IMPLANT/BRACHYTHERAPY IMPLANT;  Surgeon: Nickie Retort, MD;  Location[facility];  Service: Urology;  Laterality: N/A;  69 seeds implanted  . SKIN GRAFT     face  . SPACE OAR INSTILLATION N/A 07/19/2017   Procedure: SPACE OAR INSTILLATION;  Surgeon: Nickie Retort, MD;  Location: [facility];  Service: Urology;  Laterality: N/A;  . TEE WITHOUT CARDIOVERSION  03/20/2012   Procedure: TRANSESOPHAGEAL ECHOCARDIOGRAM (TEE);  Surgeon: Laverda Page, MD;  Location: [facility];  Service: Cardiovascular;  Laterality: N/A;  normal LV; normal EF; normal RV; normal LA w/ left atrial appendage very small, normal function, interatrial septum intact without defect; normal RA; trace MR,TR, & PI; mild AV calcification and senile  degeneration w/ mild stenosis, AVA 1.7cm^2;;   . TEE WITHOUT CARDIOVERSION N/A 05/30/2018   Procedure: TRANSESOPHAGEAL ECHOCARDIOGRAM (TEE);  Surgeon: [name], MD;  Location: [facility];  Service: Open Heart Surgery;  Laterality: N/A;  . TOTAL HIP ARTHROPLASTY Right 10/23/2017   Procedure: TOTAL HIP ARTHROPLASTY ANTERIOR APPROACH;  Surgeon: Frederik Pear, MD;  Location: [place];  Service: Orthopedics;  Laterality: Right;  .  TRANSTHORACIC ECHOCARDIOGRAM  01-11-2017   dr [name]  (per echo note, no significant change in seveity of AS, no other diagnostic change)   moderate concentric LVH, ef 92%, grade 1 diastolic dysfunction/  moderate LAE/  mild , grade 1 AR w/ moderate AV calcification, mild to moderate restricted AV leaflets w/ moderate AS, AVA 1.16cm^2, peak grandient 235mHg, mean grandient 310mg/  trace MR, mild calcification MV annulus , mild MV leaflet calcification, mild MVS, peak grandient 4.35m92m, mean grandient 2.7mm20m trace TR    FAMILY HISTORY Family History  Problem Relation Age of Onset  . Stroke Mother   . Hypertension Mother   . Heart attack Father   . Heart murmur Father   . Hypertension Sister   . Colon cancer Neg Hx   . Stomach cancer Neg Hx   . Rectal cancer Neg Hx   . Pancreatic cancer Neg Hx     SOCIAL HISTORY Social History   Tobacco Use  . Smoking status: Former Smoker    Years: 1.00    Types: Cigarettes    Quit date: 02/04/1967    Years since quitting: 52.3  . Smokeless tobacco: Never Used  . Tobacco comment: smoked 1 q2-3 days  Substance Use Topics  . Alcohol use: Yes    Comment: occasional beer  . Drug use: No         OPHTHALMIC EXAM:  Base Eye Exam    Visual Acuity (Snellen - Linear)      Right Left   Dist [place] 20/800 20/20   Dist ph [place] NI NI   Correction: Glasses       Tonometry (Tonopen, 12:05 PM)      Right Left   Pressure 18 18       Pupils      Dark Light Shape React APD   Right 7 7 Round NR None   Left 2 1 Round Slow None        Visual Fields (Counting fingers)      Left Right    Full    Restrictions  Total inferior temporal, inferior nasal deficiencies       Extraocular Movement      Right Left    Full, Ortho Full, Ortho       Neuro/Psych    Mood/Affect: Normal       Dilation    Both eyes: 1.0% Mydriacyl, 2.5% Phenylephrine @ 12:05 PM        Slit Lamp and Fundus Exam    Slit Lamp Exam      Right Left   Lids/Lashes Dermatochalasis - upper lid, mild Telangiectasia, Meibomian gland dysfunction Dermatochalasis - upper lid, Meibomian gland dysfunction   Conjunctiva/Sclera Temporal Pinguecula Temporal Pinguecula   Cornea 2+ Punctate epithelial erosions, +verticellata 1+ Punctate epithelial erosions, +verticellata, Debris in tear film   Anterior Chamber Deep, 3+pigment Deep and quiet   Iris Round and dilated, scattered Transillumination defects 360 Round and dilated, scattered, 360 Transillumination defects   Lens 3 piece Posterior chamber intraocular lens displaced inferiorly, but stable, no pseudophakodenisis 3 piece Posterior chamber intraocular in good position   Vitreous Vitreous syneresis, +pigment, Posterior vitreous detachment Vitreous syneresis, Posterior vitreous detachment       Fundus Exam      Right Left   Disc Pink and Sharp Mild Pallor, Sharp rim, temporal PPP   C/D Ratio 0.4 0.4   Macula Superior macula obscured, +SRF, fovea detached Flat, Blunted foveal reflex, Retinal pigment epithelial mottling, No heme or edema  Vessels Vascular attenuation, mild Tortuousity Vascular attenuation, Tortuous   Periphery bullous superior retinal detachment from 1000-0130, another more shallow detachment lobe from 130-400, lattice and micro tears ST quadrant; Old retinal tear at 0900 with surrounding laser, Attached, peripheral laser scars at 1030           IMAGING AND PROCEDURES  Imaging and Procedures for _0 @  OCT, Retina - OU - Both Eyes       Right Eye Quality was poor. Central  Foveal Thickness: 993. Progression has no prior data. Findings include abnormal foveal contour, intraretinal fluid, subretinal fluid.   Left Eye Quality was good. Central Foveal Thickness: 265. Progression has no prior data. Findings include normal foveal contour, no IRF, no SRF.   Notes *Images captured and stored on drive  Diagnosis / Impression:  OD: macula and fovea involving RD, +IRF centrally OS: NFP, no IRF/SRF   Clinical management:  See below  Abbreviations: NFP - Normal foveal profile. CME - cystoid macular edema. PED - pigment epithelial detachment. IRF - intraretinal fluid. SRF - subretinal fluid. EZ - ellipsoid zone. ERM - epiretinal membrane. ORA - outer retinal atrophy. ORT - outer retinal tubulation. SRHM - subretinal hyper-reflective material        Color Fundus Photography Optos - OU - Both Eyes       Right Eye Progression has no prior data. Disc findings include normal observations. Macula : detached. Vessels : normal observations. Periphery : detachment (Bi-lobed superior detachment, superior lobe spanning 0900-0100, nasal lobe 0130-0400).   Left Eye Progression has no prior data. Disc findings include normal observations (Trace pallor). Macula : normal observations. Vessels : attenuated, tortuous vessels. Periphery : RPE abnormality (Laser scar SN periphery).   Notes **Images stored on drive**  Impression: OD: bullous superior retinal detachment OS: focal peripheral laser scars at 1030                  ASSESSMENT/PLAN:    ICD-10-CM   1. Right retinal detachment  H33.21 Color Fundus Photography Optos - OU - Both Eyes  2. 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 Retinal edema  H35.81 OCT, Retina - OU - Both Eyes  4. History of repair of retinal tear by laser photocoagulation  Z98.890   5. Diabetes mellitus type 2 without retinopathy ([place])  E11.9   6. Essential hypertension  I10   7. Hypertensive retinopathy of both eyes  H35.033    8. Pseudophakia of both eyes  Z96.1   9. Dislocation of intraocular lens, sequela  T85.22XS     1-3. Rhegmatogenous retinal detachment, right eye  - bullous, superior, mac off detachment, onset of foveal involvement today, 11.11.20 by history  - Bi-lobed superior detachment, superior lobe spanning 1000-0130, nasal lobe 0130-0400  - lattice degeneration with microtears noted at 1030  - The incidence, risk factors, and natural history of retinal detachment was discussed with patient.  Potential treatment options including delimiting laser, pneumatic retinopexy, scleral buckle, and vitrectomy, cryotherapy and laser, and the use of air, gas, and oil discussed with patient.  The risks of blindness, loss of vision, infection, hemorrhage, cataract progression or lens displacement were discussed with patient.  - recommend 25g PPV/EL/Gas OD under general anesthesia  - pt wishes to proceed with surgery  - RBA of procedure discussed, questions answered  - informed consent obtained and signed  - case scheduled for tomorrow, Thursday, November 12  - will need pre-op COVID test and Anesthesia clearance -- significant  past medical history  - NPO after midnight tonight  - COVID test tomorrow at 8 am  - f/u Friday, November 13 for POV1  4. History of retinal defects s/p laser retinopexy OU with Dr. Zigmund Daniel in 2013  - OD laser at 0900  - OS laser at 1030  - stable  5. Diabetes mellitus, type 2 without retinopathy  - The incidence, risk factors for progression, natural history and treatment options for diabetic retinopathy  were discussed with patient.    - The need for close monitoring of blood glucose, blood pressure, and serum lipids, avoiding cigarette or any type of tobacco, and the need for long term follow up was also discussed with patient.  6,7. Hypertensive retinopathy OU  - discussed importance of tight BP control  - monitor  8,9. Pseudophakia OU  - s/p CE/IOL OU (Dr. [name])  - 3 piece  IOL OD displaced inferiorly -- no significant pseudophakodonesis -- stable  - OS 3-piece IOL in good position  - monitor   Ophthalmic Meds Ordered this visit:  No orders of the defined types were placed in this encounter.      Return in about 2 days (around 06/14/2019) for POV OD.  There are no Patient Instructions on file for this visit.   Explained the diagnoses, plan, and follow up with the patient and they expressed understanding.  Patient expressed understanding of the importance of proper follow up care.  This document serves as a record of services personally performed by Gardiner Sleeper, MD, PhD. It was created on their behalf by Ernest Mallick, OA, an ophthalmic assistant. The creation of this record is the provider's dictation and/or activities during the visit.    Electronically signed by: Ernest Mallick, OA 11.11.2020 11:29 PM    Gardiner Sleeper, M.D., Ph.D. Diseases & Surgery of the Retina and Vitreous [facility]  I have reviewed the above documentation for accuracy and completeness, and I agree with the above. Gardiner Sleeper, M.D., Ph.D. 06/12/19 11:29 PM     Abbreviations: M myopia (nearsighted); A astigmatism; H hyperopia (farsighted); P presbyopia; Mrx spectacle prescription;  CTL contact lenses; OD right eye; OS left eye; OU both eyes  XT exotropia; ET esotropia; PEK punctate epithelial keratitis; PEE punctate epithelial erosions; DES dry eye syndrome; MGD meibomian gland dysfunction; ATs artificial tears; PFAT's preservative free artificial tears; [employer] nuclear sclerotic cataract; PSC posterior subcapsular cataract; ERM epi-retinal membrane; PVD posterior vitreous detachment; RD retinal detachment; DM diabetes mellitus; DR diabetic retinopathy; NPDR non-proliferative diabetic retinopathy; PDR proliferative diabetic retinopathy; CSME clinically significant macular edema; DME diabetic macular edema; dbh dot blot hemorrhages; CWS cotton wool spot;  POAG primary open angle glaucoma; C/D cup-to-disc ratio; HVF humphrey visual field; GVF goldmann visual field; OCT optical coherence tomography; IOP intraocular pressure; BRVO Branch retinal vein occlusion; CRVO central retinal vein occlusion; CRAO central retinal artery occlusion; BRAO branch retinal artery occlusion; RT retinal tear; SB scleral buckle; PPV pars plana vitrectomy; VH Vitreous hemorrhage; PRP panretinal laser photocoagulation; IVK intravitreal kenalog; VMT vitreomacular traction; MH Macular hole;  NVD neovascularization of the disc; NVE neovascularization elsewhere; AREDS age related eye disease study; ARMD age related macular degeneration; POAG primary open angle glaucoma; EBMD epithelial/anterior basement membrane dystrophy; ACIOL anterior chamber intraocular lens; IOL intraocular lens; PCIOL posterior chamber intraocular lens; Phaco/IOL phacoemulsification with intraocular lens placement; [place] photorefractive keratectomy; LASIK laser assisted in situ keratomileusis; HTN hypertension; DM diabetes mellitus; COPD chronic obstructive pulmonary disease

## 2019-06-12 NOTE — Anesthesia Preprocedure Evaluation (Addendum)
Anesthesia Evaluation  Patient identified by MRN, date of birth, ID band Patient awake    Reviewed: Allergy & Precautions, NPO status , Patient's Chart, lab work & pertinent test results  Airway Mallampati: II  TM Distance: >3 FB Neck ROM: Full    Dental  (+) Teeth Intact, Dental Advisory Given   Pulmonary sleep apnea , former smoker,    breath sounds clear to auscultation       Cardiovascular hypertension, Pt. on medications and Pt. on home beta blockers + Past MI and +CHF  + dysrhythmias Atrial Fibrillation + Valvular Problems/Murmurs  Rhythm:Irregular Rate:Normal  AVR/Maze  2019   Neuro/Psych PSYCHIATRIC DISORDERS    GI/Hepatic Neg liver ROS, GERD  ,  Endo/Other  diabetes, Type 2, Insulin Dependent  Renal/GU negative Renal ROS     Musculoskeletal   Abdominal   Peds  Hematology  (+) anemia ,   Anesthesia Other Findings   Reproductive/Obstetrics                         Anesthesia Physical Anesthesia Plan  ASA: III  Anesthesia Plan: General   Post-op Pain Management:    Induction: Intravenous  PONV Risk Score and Plan: 2 and Ondansetron  Airway Management Planned: Oral ETT  Additional Equipment:   Intra-op Plan:   Post-operative Plan: Possible Post-op intubation/ventilation  Informed Consent: I have reviewed the patients History and Physical, chart, labs and discussed the procedure including the risks, benefits and alternatives for the proposed anesthesia with the patient or authorized representative who has indicated his/her understanding and acceptance.     Dental advisory given  Plan Discussed with: CRNA and Anesthesiologist  Anesthesia Plan Comments: (PAT note written 06/12/2019 by Myra Gianotti, PA-C. SAME DAY WORK-UP   )       Anesthesia Quick Evaluation

## 2019-06-12 NOTE — Progress Notes (Signed)
Wife Butch Penny states patient does not have any shortness of breath, fever, cough and chest pain.  PCP - Dr Kirkland Hun Cardiologist - Dr [name]  Chest x-ray - 08/20/18 EKG - 02/18/19 Stress Test - denies ECHO - 05/30/18 Cardiac Cath - 03/01/18  Sleep Study - Yes CPAP - uses nightly  Fasting Blood Sugar -  100s Checks Blood Sugar ___1__ times a day  . THE NIGHT BEFORE SURGERY, take 1/2 of your normal dose of lantus insulin.      . If your blood sugar is less than 70 mg/dL, you will need to treat for low blood sugar: o Treat a low blood sugar (less than 70 mg/dL) with  cup of clear juice (cranberry or apple), 4 glucose tablets, OR glucose gel. o Recheck blood sugar in 15 minutes after treatment (to make sure it is greater than 70 mg/dL). If your blood sugar is not greater than 70 mg/dL on recheck, call [phone] for further instructions.  Blood Thinner Instructions:  Eliquis last dose on 06/11/19  Anesthesia review: Yes.  STOP now taking any Aspirin (unless otherwise instructed by your surgeon), Aleve, Naproxen, Ibuprofen, Motrin, Advil, Goody's, BC's, all herbal medications, fish oil, and all vitamins.   Coronavirus Screening Have you experienced the following symptoms:  Cough yes/no: No Fever (>100.33F)  yes/no: No Runny nose yes/no: No Sore throat yes/no: No Difficulty breathing/shortness of breath  yes/no: No  Have you traveled in the last 14 days and where? yes/no: No  Wife Butch Penny verbalized understanding of instructions that were given via phone.

## 2019-06-12 NOTE — H&P (Signed)
Cody Phelps is an 78 y.o. male.    Chief Complaint: decreased vision OD  HPI: Pt with complex past medical history who presents with 5 day history of progressive vision loss OD. On dilated exam, found to have a macula and fovea involving retinal detachment. After discussion of the risks benefits and alternatives to surgery, the patient elects to proceed with surgical repair of his retinal detachment under general anesthesia.  Past Medical History:  Diagnosis Date  . Acute anterior wall MI ([place]) 03/01/2018  . Acute combined systolic and diastolic heart failure ([place]) 03/15/2018  . Bronchitis, acute 05/04/2012  . Burn   . Carotid stenosis, left followed by dr [name], cardiologist--- bilateral bruit per note   per dr [name] note [id number]  LICA 0000000 stenosis per duplex 05-14-2014  . Chronic diastolic CHF (congestive heart failure) ([place]) 09/21/2018  . Diverticulosis   . DM (diabetes mellitus) type 2, uncontrolled, with ketoacidosis ([place]) 11/25/2013  . ED (erectile dysfunction)   . First degree heart block   . GERD (gastroesophageal reflux disease)   . Heart murmur   . Hypertension    cardiologsit-  dr [name]  . Iron deficiency anemia   . Mixed hyperlipidemia   . Morbid obesity ([place])   . Myocardial infarction ([place])    02/28/18  . OA (osteoarthritis)    right hip  . OSA on CPAP    followed by dr dohmeier, uses CPAP  . PAF (paroxysmal atrial fibrillation) ([name])    a. on Eliquis  . Prostate cancer ([place])    dx 03-17-2017 (bx)  Stage T2a, Gleason 4+4, PSA  4.43, vol 32.32--  s/p radiatctive prostate seed implants 07-10-2017 then IMRT and ADT  . RLS (restless legs syndrome)   . S/P aortic valve replacement with bioprosthetic valve 05/30/2018   23 mm Edwards Inspiris Resilia stented bovine pericardial tissue valve  . 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 S/P Maze operation for atrial fibrillation 05/30/2018   Complete bilateral atrial lesion set using bipolar radiofrequency and  cryothermy ablation with clipping of LA appendage  . Scoliosis   . Severe aortic stenosis   . Trigger finger   . Type 2 diabetes mellitus ([place])   . Wears partial dentures    upper and lower    Past Surgical History:  Procedure Laterality Date  . AORTIC VALVE REPLACEMENT N/A 05/30/2018   Procedure: AORTIC VALVE REPLACEMENT (AVR) using Inspiris Valve, Size 23;  Surgeon: [name], MD;  Location: [facility];  Service: Open Heart Surgery;  Laterality: N/A;  . APPENDECTOMY  1988  . BILATERAL TOTAL ETHMOIDECTOMY AND SPHENOIDECTOMY  01-14-2009    dr bates   [facility]  . CARDIOVERSION N/A 08/10/2018   Procedure: CARDIOVERSION;  Surgeon: Adrian Prows, MD;  Location: [facility];  Service: Cardiovascular;  Laterality: N/A;  . CARPAL TUNNEL RELEASE Bilateral 2013  . CATARACT EXTRACTION W/ INTRAOCULAR LENS  IMPLANT, BILATERAL  date?  . CORONARY ARTERY BYPASS GRAFT N/A 05/30/2018   Procedure: CORONARY ARTERY BYPASS GRAFTING (CABG) x one using left internal mammary artery;  Surgeon: [name], MD;  Location: [facility];  Service: Open Heart Surgery;  Laterality: N/A;  . CORONARY BALLOON ANGIOPLASTY N/A 03/01/2018   Procedure: CORONARY BALLOON ANGIOPLASTY;  Surgeon: Adrian Prows, MD;  Location: [place] [facility];  Service: Cardiovascular;  Laterality: N/A;  . CORONARY/GRAFT ACUTE MI REVASCULARIZATION N/A 03/01/2018   Procedure: Coronary/Graft Acute MI Revascularization;  Surgeon: Adrian Prows, MD;  Location:  [place] [facility];  Service: Cardiovascular;  Laterality: N/A;  . CYSTOSCOPY  07/19/2017   Procedure: CYSTOSCOPY;  Surgeon: Nickie Retort, MD;  Location: [facility];  Service: Urology;;  No seeds found in [place]  . LAMINECTOMY WITH POSTERIOR LATERAL ARTHRODESIS LEVEL 2 N/A 02/12/2014   Procedure: LUMBAR TWO-THREE,LUIMBAR THREE-FOUR LAMINECTOMY/FORAMINOTOMY;POSSIBLE POSTEROLATERAL ARTHRODESIS WITH AUTOGRAFT;  Surgeon: [name], MD;  Location: [facility];  Service: Neurosurgery;   Laterality: N/A;  . LEFT HEART CATH AND CORONARY ANGIOGRAPHY N/A 03/01/2018   Procedure: LEFT HEART CATH AND CORONARY ANGIOGRAPHY;  Surgeon: Adrian Prows, MD;  Location: [place] [facility];  Service: Cardiovascular;  Laterality: N/A;  . MAZE N/A 05/30/2018   Procedure: MAZE;  Surgeon: [name], MD;  Location: [place];  Service: Open Heart Surgery;  Laterality: N/A;  . ORIF RIGHT ANKLE FX'S  05/05/2001   retained hardware  . PROSTATE BIOPSY  03-17-2017   dr Pilar Jarvis office  . RADIOACTIVE SEED IMPLANT N/A 07/19/2017   Procedure: RADIOACTIVE SEED IMPLANT/BRACHYTHERAPY IMPLANT;  Surgeon: Nickie Retort, MD;  Location: [facility];  Service: Urology;  Laterality: N/A;  69 seeds implanted  . SKIN GRAFT    . SPACE OAR INSTILLATION N/A 07/19/2017   Procedure: SPACE OAR INSTILLATION;  Surgeon: Nickie Retort, MD;  Location: [facility];  Service: Urology;  Laterality: N/A;  . TEE WITHOUT CARDIOVERSION  03/20/2012   Procedure: TRANSESOPHAGEAL ECHOCARDIOGRAM (TEE);  Surgeon: Laverda Page, MD;  Location: [facility];  Service: Cardiovascular;  Laterality: N/A;  normal LV; normal EF; normal RV; normal LA w/ left atrial appendage very small, normal function, interatrial septum intact without defect; normal RA; trace MR,TR, & PI; mild AV calcification and senile degeneration w/ mild stenosis, AVA 1.7cm^2;;   . TEE WITHOUT CARDIOVERSION N/A 05/30/2018   Procedure: TRANSESOPHAGEAL ECHOCARDIOGRAM (TEE);  Surgeon: [name], MD;  Location: [place];  Service: Open Heart Surgery;  Laterality: N/A;  . TOTAL HIP ARTHROPLASTY Right 10/23/2017   Procedure: TOTAL HIP ARTHROPLASTY ANTERIOR APPROACH;  Surgeon: Frederik Pear, MD;  Location: [place];  Service: Orthopedics;  Laterality: Right;  . TRANSTHORACIC ECHOCARDIOGRAM  01-11-2017   dr [name]  (per echo note, no significant change in seveity of AS, no other diagnostic change)   moderate concentric LVH, ef 64%, grade 1  diastolic dysfunction/  moderate LAE/  mild , grade 1 AR w/ moderate AV calcification, mild to moderate restricted AV leaflets w/ moderate AS, AVA 1.16cm^2, peak grandient 17mmHg, mean grandient 70mmHg/  trace MR, mild calcification MV annulus , mild MV leaflet calcification, mild MVS, peak grandient 4.24mmHg, mean grandient 2.73mmHg/ trace TR    Family History  Problem Relation Age of Onset  . Stroke Mother   . Hypertension Mother   . Heart attack Father   . Heart murmur Father   . Hypertension Sister   . Colon cancer Neg Hx   . Stomach cancer Neg Hx   . Rectal cancer Neg Hx   . Pancreatic cancer Neg Hx    Social History:  reports that he quit smoking about 52 years ago. His smoking use included cigarettes. He quit after 1.00 year of use. He has never used smokeless tobacco. He reports current alcohol use. He reports that he does not use drugs.  Allergies: No Known Allergies  No medications prior to admission.    Review of systems otherwise negative  There were no vitals taken for this visit.  Physical exam:  Mental status: oriented x3. Eyes: See eye exam associated with this date of surgery Ears, Nose, Throat: within normal limits Neck: Within Normal limits General: within normal limits Chest: Within normal limits Breast: deferred Heart: Within normal limits Abdomen: Within normal limits GU: deferred Extremities: within normal limits Skin: within normal limits  Assessment/Plan 1. Retinal detachment, RIGHT EYE 2. Pseudophakia with mild displacement of IOL, OD  Plan: To [facility] for 25g PPV w/ endolaser and gas OD under general anesthesia - case scheduled for tomorrow, 11.12.20, 2 pm -- [facility] OR 08 - will need pre-op COVID testing and anesthesia consult   Gardiner Sleeper, M.D., Ph.D. Vitreoretinal Surgeon [facility]

## 2019-06-13 ENCOUNTER — Ambulatory Visit (HOSPITAL_COMMUNITY): Payer: Medicare Other | Admitting: Vascular Surgery

## 2019-06-13 ENCOUNTER — Other Ambulatory Visit: Payer: Self-pay

## 2019-06-13 ENCOUNTER — Ambulatory Visit (HOSPITAL_COMMUNITY)
Admission: RE | Admit: 2019-06-13 | Discharge: 2019-06-13 | Disposition: A | Discharge status: Home / Self Care | Payer: Medicare Other | Attending: Ophthalmology | Admitting: Ophthalmology

## 2019-06-13 ENCOUNTER — Encounter (HOSPITAL_COMMUNITY)
Admission: RE | Disposition: A | Discharge status: Home / Self Care | Payer: Self-pay | Source: Home / Self Care | Attending: Ophthalmology

## 2019-06-13 ENCOUNTER — Other Ambulatory Visit (HOSPITAL_COMMUNITY)
Admission: RE | Admit: 2019-06-13 | Discharge: 2019-06-13 | Disposition: A | Discharge status: Home / Self Care | Payer: Medicare Other | Source: Ambulatory Visit | Attending: Ophthalmology | Admitting: Ophthalmology

## 2019-06-13 ENCOUNTER — Encounter (HOSPITAL_COMMUNITY): Payer: Self-pay | Admitting: Orthopedic Surgery

## 2019-06-13 DIAGNOSIS — E782 Mixed hyperlipidemia: Secondary | ICD-10-CM | POA: Diagnosis not present

## 2019-06-13 DIAGNOSIS — H338 Other retinal detachments: Secondary | ICD-10-CM | POA: Diagnosis not present

## 2019-06-13 DIAGNOSIS — Z87891 Personal history of nicotine dependence: Secondary | ICD-10-CM | POA: Diagnosis not present

## 2019-06-13 DIAGNOSIS — Z951 Presence of aortocoronary bypass graft: Secondary | ICD-10-CM | POA: Insufficient documentation

## 2019-06-13 DIAGNOSIS — I4892 Unspecified atrial flutter: Secondary | ICD-10-CM | POA: Insufficient documentation

## 2019-06-13 DIAGNOSIS — M1990 Unspecified osteoarthritis, unspecified site: Secondary | ICD-10-CM | POA: Insufficient documentation

## 2019-06-13 DIAGNOSIS — H33001 Unspecified retinal detachment with retinal break, right eye: Secondary | ICD-10-CM | POA: Diagnosis not present

## 2019-06-13 DIAGNOSIS — Z8546 Personal history of malignant neoplasm of prostate: Secondary | ICD-10-CM | POA: Insufficient documentation

## 2019-06-13 DIAGNOSIS — I5042 Chronic combined systolic (congestive) and diastolic (congestive) heart failure: Secondary | ICD-10-CM | POA: Insufficient documentation

## 2019-06-13 DIAGNOSIS — I480 Paroxysmal atrial fibrillation: Secondary | ICD-10-CM | POA: Diagnosis not present

## 2019-06-13 DIAGNOSIS — G4733 Obstructive sleep apnea (adult) (pediatric): Secondary | ICD-10-CM | POA: Insufficient documentation

## 2019-06-13 DIAGNOSIS — Z961 Presence of intraocular lens: Secondary | ICD-10-CM | POA: Diagnosis not present

## 2019-06-13 DIAGNOSIS — Z20828 Contact with and (suspected) exposure to other viral communicable diseases: Secondary | ICD-10-CM | POA: Diagnosis not present

## 2019-06-13 DIAGNOSIS — Z6832 Body mass index (BMI) 32.0-32.9, adult: Secondary | ICD-10-CM | POA: Insufficient documentation

## 2019-06-13 DIAGNOSIS — Z9841 Cataract extraction status, right eye: Secondary | ICD-10-CM | POA: Insufficient documentation

## 2019-06-13 DIAGNOSIS — E119 Type 2 diabetes mellitus without complications: Secondary | ICD-10-CM | POA: Insufficient documentation

## 2019-06-13 DIAGNOSIS — I252 Old myocardial infarction: Secondary | ICD-10-CM | POA: Diagnosis not present

## 2019-06-13 DIAGNOSIS — Z7901 Long term (current) use of anticoagulants: Secondary | ICD-10-CM | POA: Insufficient documentation

## 2019-06-13 DIAGNOSIS — Z794 Long term (current) use of insulin: Secondary | ICD-10-CM | POA: Diagnosis not present

## 2019-06-13 DIAGNOSIS — I110 Hypertensive heart disease with heart failure: Secondary | ICD-10-CM | POA: Insufficient documentation

## 2019-06-13 DIAGNOSIS — G2581 Restless legs syndrome: Secondary | ICD-10-CM | POA: Diagnosis not present

## 2019-06-13 DIAGNOSIS — Z79899 Other long term (current) drug therapy: Secondary | ICD-10-CM | POA: Insufficient documentation

## 2019-06-13 DIAGNOSIS — Z953 Presence of xenogenic heart valve: Secondary | ICD-10-CM | POA: Insufficient documentation

## 2019-06-13 HISTORY — DX: Personal history of other medical treatment: Z92.89

## 2019-06-13 HISTORY — PX: PERFLUORONE INJECTION: SHX5302

## 2019-06-13 HISTORY — PX: PHOTOCOAGULATION WITH LASER: SHX6027

## 2019-06-13 HISTORY — PX: RETINAL DETACHMENT SURGERY: SHX105

## 2019-06-13 HISTORY — PX: GAS/FLUID EXCHANGE: SHX5334

## 2019-06-13 HISTORY — DX: Unspecified dementia, unspecified severity, without behavioral disturbance, psychotic disturbance, mood disturbance, and anxiety: F03.90

## 2019-06-13 HISTORY — PX: PARS PLANA VITRECTOMY: SHX2166

## 2019-06-13 LAB — GLUCOSE, CAPILLARY
Glucose-Capillary: 102 mg/dL — ABNORMAL HIGH (ref 70–99)
Glucose-Capillary: 100 mg/dL — ABNORMAL HIGH (ref 70–99)

## 2019-06-13 LAB — BASIC METABOLIC PANEL
Anion gap: 9 (ref 5–15)
BUN: 19 mg/dL (ref 8–23)
CO2: 21 mmol/L — ABNORMAL LOW (ref 22–32)
Calcium: 9.3 mg/dL (ref 8.9–10.3)
Chloride: 108 mmol/L (ref 98–111)
Creatinine, Ser: 1.1 mg/dL (ref 0.61–1.24)
GFR calc Af Amer: >60 mL/min (ref >60)
GFR calc non Af Amer: >60 mL/min (ref >60)
Glucose, Bld: 85 mg/dL (ref 70–99)
Potassium: 4 mmol/L (ref 3.5–5.1)
Sodium: 138 mmol/L (ref 135–145)

## 2019-06-13 LAB — CBC
HCT: 31.1 % — ABNORMAL LOW (ref 39.0–52.0)
Hemoglobin: 9.7 g/dL — ABNORMAL LOW (ref 13.0–17.0)
MCH: 28.3 pg (ref 26.0–34.0)
MCHC: 31.2 g/dL (ref 30.0–36.0)
MCV: 90.7 fL (ref 80.0–100.0)
Platelets: 187 10*3/uL (ref 150–400)
RBC: 3.43 MIL/uL — ABNORMAL LOW (ref 4.22–5.81)
RDW: 14.8 % (ref 11.5–15.5)
WBC: 5.3 10*3/uL (ref 4.0–10.5)
nRBC: 0 % (ref 0.0–0.2)

## 2019-06-13 LAB — SARS CORONAVIRUS 2 (TAT 6-24 HRS): SARS Coronavirus 2: NEGATIVE

## 2019-06-13 LAB — PROTIME-INR
INR: 1.4 — ABNORMAL HIGH (ref 0.8–1.2)
Prothrombin Time: 16.7 seconds — ABNORMAL HIGH (ref 11.4–15.2)

## 2019-06-13 SURGERY — PARS PLANA VITRECTOMY WITH 25 GAUGE
Anesthesia: General | Site: Eye | Laterality: Right

## 2019-06-13 MED ORDER — BACITRACIN-POLYMYXIN B 500-10000 UNIT/GM OP OINT
TOPICAL_OINTMENT | OPHTHALMIC | Status: DC | PRN
  Administered 2019-06-13: 1 via OPHTHALMIC

## 2019-06-13 MED ORDER — PHENYLEPHRINE HCL 10 % OP SOLN
1.0000 [drp] | OPHTHALMIC | Status: AC | PRN
  Administered 2019-06-13 (×3): 1 [drp] via OPHTHALMIC

## 2019-06-13 MED ORDER — TRIAMCINOLONE ACETONIDE 40 MG/ML IJ SUSP
INTRAMUSCULAR | Status: AC
  Filled 2019-06-13: qty 5

## 2019-06-13 MED ORDER — SUGAMMADEX SODIUM 200 MG/2ML IV SOLN
INTRAVENOUS | Status: DC | PRN
  Administered 2019-06-13: 200 mg via INTRAVENOUS

## 2019-06-13 MED ORDER — CARBACHOL 0.01 % IO SOLN
INTRAOCULAR | Status: AC
  Filled 2019-06-13: qty 1.5

## 2019-06-13 MED ORDER — STERILE WATER FOR INJECTION IJ SOLN
INTRAMUSCULAR | Status: DC | PRN
  Administered 2019-06-13: 20 mL

## 2019-06-13 MED ORDER — EPINEPHRINE PF 1 MG/ML IJ SOLN
INTRAOCULAR | Status: DC | PRN
  Administered 2019-06-13: 500.3 mL

## 2019-06-13 MED ORDER — EPHEDRINE 5 MG/ML INJ
INTRAVENOUS | Status: AC
  Filled 2019-06-13: qty 10

## 2019-06-13 MED ORDER — DEXTROSE 5 % IV SOLN
INTRAVENOUS | Status: AC
  Filled 2019-06-13: qty 100

## 2019-06-13 MED ORDER — ATROPINE SULFATE 1 % OP SOLN
OPHTHALMIC | Status: DC | PRN
  Administered 2019-06-13: 1 [drp] via OPHTHALMIC

## 2019-06-13 MED ORDER — DEXAMETHASONE SODIUM PHOSPHATE 4 MG/ML IJ SOLN
INTRAMUSCULAR | Status: DC | PRN
  Administered 2019-06-13: 4 mg via INTRAVENOUS

## 2019-06-13 MED ORDER — BRIMONIDINE TARTRATE 0.2 % OP SOLN
OPHTHALMIC | Status: AC
  Filled 2019-06-13: qty 5

## 2019-06-13 MED ORDER — GATIFLOXACIN 0.5 % OP SOLN OPTIME - NO CHARGE
OPHTHALMIC | Status: DC | PRN
  Administered 2019-06-13: 1 [drp] via OPHTHALMIC

## 2019-06-13 MED ORDER — PREDNISOLONE ACETATE 1 % OP SUSP
OPHTHALMIC | Status: DC | PRN
  Administered 2019-06-13: 1 [drp] via OPHTHALMIC

## 2019-06-13 MED ORDER — INDOCYANINE GREEN 25 MG IV SOLR
INTRAVENOUS | Status: AC
  Filled 2019-06-13: qty 25

## 2019-06-13 MED ORDER — STERILE WATER FOR IRRIGATION IR SOLN
Status: DC | PRN
  Administered 2019-06-13: 1000 mL

## 2019-06-13 MED ORDER — DORZOLAMIDE HCL-TIMOLOL MAL 2-0.5 % OP SOLN
OPHTHALMIC | Status: DC | PRN
  Administered 2019-06-13: 1 [drp] via OPHTHALMIC

## 2019-06-13 MED ORDER — PROPOFOL 10 MG/ML IV BOLUS
INTRAVENOUS | Status: DC | PRN
  Administered 2019-06-13: 20 mg via INTRAVENOUS
  Administered 2019-06-13: 110 mg via INTRAVENOUS

## 2019-06-13 MED ORDER — EPINEPHRINE PF 1 MG/ML IJ SOLN
INTRAMUSCULAR | Status: AC
  Filled 2019-06-13: qty 1

## 2019-06-13 MED ORDER — ONDANSETRON HCL 4 MG/2ML IJ SOLN
INTRAMUSCULAR | Status: DC | PRN
  Administered 2019-06-13: 4 mg via INTRAVENOUS

## 2019-06-13 MED ORDER — ONDANSETRON HCL 4 MG/2ML IJ SOLN
INTRAMUSCULAR | Status: AC
  Filled 2019-06-13: qty 2

## 2019-06-13 MED ORDER — NA CHONDROIT SULF-NA HYALURON 40-30 MG/ML IO SOLN
INTRAOCULAR | Status: DC | PRN
  Administered 2019-06-13: 0.5 mL via INTRAOCULAR

## 2019-06-13 MED ORDER — PHENYLEPHRINE HCL 10 % OP SOLN
OPHTHALMIC | Status: AC
  Administered 2019-06-13: 1 [drp] via OPHTHALMIC
  Filled 2019-06-13: qty 5

## 2019-06-13 MED ORDER — BRIMONIDINE TARTRATE 0.2 % OP SOLN
OPHTHALMIC | Status: DC | PRN
  Administered 2019-06-13: 1 [drp] via OPHTHALMIC

## 2019-06-13 MED ORDER — LIDOCAINE HCL (PF) 4 % IJ SOLN
INTRAMUSCULAR | Status: AC
  Filled 2019-06-13: qty 5

## 2019-06-13 MED ORDER — NA CHONDROIT SULF-NA HYALURON 40-30 MG/ML IO SOLN
INTRAOCULAR | Status: AC
  Filled 2019-06-13: qty 1

## 2019-06-13 MED ORDER — TROPICAMIDE 1 % OP SOLN
1.0000 [drp] | OPHTHALMIC | Status: AC | PRN
  Administered 2019-06-13 (×3): 1 [drp] via OPHTHALMIC

## 2019-06-13 MED ORDER — POLYMYXIN B SULFATE 500000 UNITS IJ SOLR
INTRAMUSCULAR | Status: AC
  Filled 2019-06-13: qty 500000

## 2019-06-13 MED ORDER — PROPARACAINE HCL 0.5 % OP SOLN
1.0000 [drp] | OPHTHALMIC | Status: AC | PRN
  Administered 2019-06-13 (×3): 1 [drp] via OPHTHALMIC

## 2019-06-13 MED ORDER — DORZOLAMIDE HCL-TIMOLOL MAL 2-0.5 % OP SOLN
OPHTHALMIC | Status: AC
  Filled 2019-06-13: qty 10

## 2019-06-13 MED ORDER — ROCURONIUM BROMIDE 10 MG/ML (PF) SYRINGE
PREFILLED_SYRINGE | INTRAVENOUS | Status: AC
  Filled 2019-06-13: qty 10

## 2019-06-13 MED ORDER — ACETAZOLAMIDE SODIUM 500 MG IJ SOLR
INTRAMUSCULAR | Status: AC
  Filled 2019-06-13: qty 500

## 2019-06-13 MED ORDER — FENTANYL CITRATE (PF) 250 MCG/5ML IJ SOLN
INTRAMUSCULAR | Status: AC
  Filled 2019-06-13: qty 5

## 2019-06-13 MED ORDER — STERILE WATER FOR INJECTION IJ SOLN
INTRAMUSCULAR | Status: AC
  Filled 2019-06-13: qty 10

## 2019-06-13 MED ORDER — CEFTAZIDIME 1 G IJ SOLR
INTRAMUSCULAR | Status: AC
  Filled 2019-06-13: qty 1

## 2019-06-13 MED ORDER — SODIUM CHLORIDE 0.9 % IV SOLN
INTRAVENOUS | Status: DC
  Administered 2019-06-13 (×2): via INTRAVENOUS

## 2019-06-13 MED ORDER — SODIUM CHLORIDE (PF) 0.9 % IJ SOLN
INTRAMUSCULAR | Status: AC
  Filled 2019-06-13: qty 10

## 2019-06-13 MED ORDER — PROPOFOL 10 MG/ML IV BOLUS
INTRAVENOUS | Status: AC
  Filled 2019-06-13: qty 20

## 2019-06-13 MED ORDER — PREDNISOLONE ACETATE 1 % OP SUSP
OPHTHALMIC | Status: AC
  Filled 2019-06-13: qty 5

## 2019-06-13 MED ORDER — ATROPINE SULFATE 1 % OP SOLN
OPHTHALMIC | Status: AC
  Administered 2019-06-13: 1 [drp] via OPHTHALMIC
  Filled 2019-06-13: qty 5

## 2019-06-13 MED ORDER — GATIFLOXACIN 0.5 % OP SOLN
OPHTHALMIC | Status: AC
  Filled 2019-06-13: qty 2.5

## 2019-06-13 MED ORDER — EPHEDRINE SULFATE-NACL 50-0.9 MG/10ML-% IV SOSY
PREFILLED_SYRINGE | INTRAVENOUS | Status: DC | PRN
  Administered 2019-06-13 (×3): 5 mg via INTRAVENOUS

## 2019-06-13 MED ORDER — BACITRACIN-POLYMYXIN B 500-10000 UNIT/GM OP OINT
TOPICAL_OINTMENT | OPHTHALMIC | Status: AC
  Filled 2019-06-13: qty 3.5

## 2019-06-13 MED ORDER — FENTANYL CITRATE (PF) 100 MCG/2ML IJ SOLN
INTRAMUSCULAR | Status: DC | PRN
  Administered 2019-06-13: 100 ug via INTRAVENOUS

## 2019-06-13 MED ORDER — TROPICAMIDE 1 % OP SOLN
OPHTHALMIC | Status: AC
  Administered 2019-06-13: 1 [drp] via OPHTHALMIC
  Filled 2019-06-13: qty 15

## 2019-06-13 MED ORDER — ROCURONIUM BROMIDE 10 MG/ML (PF) SYRINGE
PREFILLED_SYRINGE | INTRAVENOUS | Status: DC | PRN
  Administered 2019-06-13: 20 mg via INTRAVENOUS
  Administered 2019-06-13: 50 mg via INTRAVENOUS

## 2019-06-13 MED ORDER — TRIAMCINOLONE ACETONIDE 40 MG/ML IJ SUSP
INTRAMUSCULAR | Status: DC | PRN
  Administered 2019-06-13: 40 mg via INTRAMUSCULAR

## 2019-06-13 MED ORDER — TOBRAMYCIN-DEXAMETHASONE 0.3-0.1 % OP OINT
TOPICAL_OINTMENT | OPHTHALMIC | Status: AC
  Filled 2019-06-13: qty 3.5

## 2019-06-13 MED ORDER — LIDOCAINE 2% (20 MG/ML) 5 ML SYRINGE
INTRAMUSCULAR | Status: AC
  Filled 2019-06-13: qty 5

## 2019-06-13 MED ORDER — ATROPINE SULFATE 1 % OP SOLN
1.0000 [drp] | OPHTHALMIC | Status: AC | PRN
  Administered 2019-06-13 (×3): 1 [drp] via OPHTHALMIC

## 2019-06-13 MED ORDER — LIDOCAINE 2% (20 MG/ML) 5 ML SYRINGE
INTRAMUSCULAR | Status: DC | PRN
  Administered 2019-06-13: 60 mg via INTRAVENOUS

## 2019-06-13 MED ORDER — PROPARACAINE HCL 0.5 % OP SOLN
OPHTHALMIC | Status: AC
  Administered 2019-06-13: 1 [drp] via OPHTHALMIC
  Filled 2019-06-13: qty 15

## 2019-06-13 MED ORDER — DEXAMETHASONE SODIUM PHOSPHATE 10 MG/ML IJ SOLN
INTRAMUSCULAR | Status: AC
  Filled 2019-06-13: qty 1

## 2019-06-13 MED ORDER — BSS IO SOLN
INTRAOCULAR | Status: AC
  Filled 2019-06-13: qty 15

## 2019-06-13 MED ORDER — BUPIVACAINE HCL (PF) 0.75 % IJ SOLN
INTRAMUSCULAR | Status: AC
  Filled 2019-06-13: qty 10

## 2019-06-13 MED ORDER — ATROPINE SULFATE 1 % OP SOLN
OPHTHALMIC | Status: AC
  Filled 2019-06-13: qty 5

## 2019-06-13 MED ORDER — LIDOCAINE HCL (PF) 2 % IJ SOLN
INTRAMUSCULAR | Status: AC
  Filled 2019-06-13: qty 10

## 2019-06-13 SURGICAL SUPPLY — 74 items
APL SWBSTK 6 STRL LF DISP (MISCELLANEOUS) ×4
APPLICATOR COTTON TIP 6 STRL (MISCELLANEOUS) ×4 IMPLANT
APPLICATOR COTTON TIP 6IN STRL (MISCELLANEOUS) ×12
BALL CTTN LRG ABS STRL LF (GAUZE/BANDAGES/DRESSINGS) ×3
BAND WRIST GAS GREEN (MISCELLANEOUS) IMPLANT
BLADE EYE CATARACT 19 1.4 BEAV (BLADE) IMPLANT
BNDG EYE OVAL (GAUZE/BANDAGES/DRESSINGS) ×3 IMPLANT
CABLE BIPOLOR RESECTION CORD (MISCELLANEOUS) ×3 IMPLANT
CANNULA ANT CHAM MAIN (OPHTHALMIC RELATED) IMPLANT
CANNULA FLEX TIP 25G (CANNULA) ×3 IMPLANT
CANNULA TROCAR 23 GA VLV (OPHTHALMIC) IMPLANT
CANNULA TROCAR 23G VLV (OPHTHALMIC) IMPLANT
CANNULA VLV SOFT TIP 25G (OPHTHALMIC) IMPLANT
CANNULA VLV SOFT TIP 25GA (OPHTHALMIC) IMPLANT
CLOSURE STERI-STRIP 1/2X4 (GAUZE/BANDAGES/DRESSINGS) ×1
CLSR STERI-STRIP ANTIMIC 1/2X4 (GAUZE/BANDAGES/DRESSINGS) ×2 IMPLANT
COTTONBALL LRG STERILE PKG (GAUZE/BANDAGES/DRESSINGS) ×9 IMPLANT
COVER WAND RF STERILE (DRAPES) ×3 IMPLANT
DRAPE MICROSCOPE LEICA 46X105 (MISCELLANEOUS) ×3 IMPLANT
DRAPE OPHTHALMIC 77X100 STRL (CUSTOM PROCEDURE TRAY) ×3 IMPLANT
ERASER HMR WETFIELD 23G BP (MISCELLANEOUS) IMPLANT
FILTER BLUE MILLIPORE (MISCELLANEOUS) IMPLANT
FILTER STRAW FLUID ASPIR (MISCELLANEOUS) IMPLANT
FORCEPS GRIESHABER ILM 25G A (INSTRUMENTS) IMPLANT
GAS AUTO FILL CONSTEL (OPHTHALMIC)
GAS AUTO FILL CONSTELLATION (OPHTHALMIC) IMPLANT
GAS WRIST BAND GREEN (MISCELLANEOUS)
GLOVE BIO SURGEON STRL SZ7.5 (GLOVE) ×6 IMPLANT
GLOVE BIOGEL M 7.0 STRL (GLOVE) ×3 IMPLANT
GOWN STRL REUS W/ TWL LRG LVL3 (GOWN DISPOSABLE) ×2 IMPLANT
GOWN STRL REUS W/ TWL XL LVL3 (GOWN DISPOSABLE) ×1 IMPLANT
GOWN STRL REUS W/TWL LRG LVL3 (GOWN DISPOSABLE) ×6
GOWN STRL REUS W/TWL XL LVL3 (GOWN DISPOSABLE) ×3
KIT BASIN OR (CUSTOM PROCEDURE TRAY) ×3 IMPLANT
KIT PERFLUORON PROCEDURE 5ML (MISCELLANEOUS) ×2 IMPLANT
LENS MACULAR ASPHERIC CONSTEL (OPHTHALMIC) IMPLANT
LENS VITRECTOMY FLAT OCLR DISP (MISCELLANEOUS) IMPLANT
LOOP FINESSE 25 GA (MISCELLANEOUS) IMPLANT
MICROPICK 25G (MISCELLANEOUS)
NDL 18GX1X1/2 (RX/OR ONLY) (NEEDLE) ×1 IMPLANT
NDL 25GX 5/8IN NON SAFETY (NEEDLE) ×3 IMPLANT
NDL HYPO 30X.5 LL (NEEDLE) ×2 IMPLANT
NDL PRECISIONGLIDE 27X1.5 (NEEDLE) IMPLANT
NEEDLE 18GX1X1/2 (RX/OR ONLY) (NEEDLE) ×3 IMPLANT
NEEDLE 25GX 5/8IN NON SAFETY (NEEDLE) ×9 IMPLANT
NEEDLE HYPO 30X.5 LL (NEEDLE) ×6 IMPLANT
NEEDLE PRECISIONGLIDE 27X1.5 (NEEDLE) IMPLANT
NS IRRIG 1000ML POUR BTL (IV SOLUTION) ×3 IMPLANT
PACK VITRECTOMY CUSTOM (CUSTOM PROCEDURE TRAY) ×3 IMPLANT
PAD ARMBOARD 7.5X6 YLW CONV (MISCELLANEOUS) ×6 IMPLANT
PAK PIK VITRECTOMY CVS 25GA (OPHTHALMIC) ×3 IMPLANT
PENCIL BIPOLAR 25GA STR DISP (OPHTHALMIC RELATED) ×3 IMPLANT
PICK MICROPICK 25G (MISCELLANEOUS) IMPLANT
PROBE DIATHERMY DSP 27GA (MISCELLANEOUS) IMPLANT
PROBE ENDO DIATHERMY 25G (MISCELLANEOUS) IMPLANT
PROBE LASER ILLUM FLEX CVD 25G (OPHTHALMIC) IMPLANT
REPL STRA BRUSH NDL (NEEDLE) IMPLANT
REPL STRA BRUSH NEEDLE (NEEDLE) IMPLANT
RESERVOIR BACK FLUSH (MISCELLANEOUS) IMPLANT
RETRACTOR IRIS FLEX 25G GRIESH (INSTRUMENTS) IMPLANT
SET INJECTOR OIL FLUID CONSTEL (OPHTHALMIC) IMPLANT
SHIELD EYE LENSE ONLY DISP (GAUZE/BANDAGES/DRESSINGS) ×2 IMPLANT
SPONGE SURGIFOAM ABS GEL 12-7 (HEMOSTASIS) IMPLANT
STOPCOCK 4 WAY LG BORE MALE ST (IV SETS) IMPLANT
SUT VICRYL 7 0 TG140 8 (SUTURE) ×3 IMPLANT
SYR 10ML LL (SYRINGE) ×3 IMPLANT
SYR 20ML LL LF (SYRINGE) ×3 IMPLANT
SYR 5ML LL (SYRINGE) ×3 IMPLANT
SYR BULB 3OZ (MISCELLANEOUS) ×3 IMPLANT
SYR TB 1ML LUER SLIP (SYRINGE) ×6 IMPLANT
TAPE CLOTH 2X10 TAN LF (GAUZE/BANDAGES/DRESSINGS) ×2 IMPLANT
TOWEL GREEN STERILE FF (TOWEL DISPOSABLE) ×3 IMPLANT
TUBING HIGH PRESS EXTEN 6IN (TUBING) ×3 IMPLANT
WATER STERILE IRR 1000ML POUR (IV SOLUTION) ×3 IMPLANT

## 2019-06-13 NOTE — Brief Op Note (Signed)
06/13/2019  4:34 PM  PATIENT:  Cody Phelps  78 y.o. male  PRE-OPERATIVE DIAGNOSIS:  RIGHT RETINAL DETACHMENT  POST-OPERATIVE DIAGNOSIS:  RIGHT RETINAL DETACHMENT  PROCEDURE:  Procedure(s): PARS PLANA VITRECTOMY WITH 25 GAUGE WITH LASER AND GAS (Right) PHOTOCOAGULATION WITH LASER (Right) GAS/FLUID EXCHANGE (Right) PERFLUORONE INJECTION (Right)  SURGEON:  Surgeon(s) and Role:    [name], MD - Primary  ASSISTANTS: Ernest Mallick, Ophthalmic Assistant  ANESTHESIA:   general  EBL:  3 mL   BLOOD ADMINISTERED:none  DRAINS: none   LOCAL MEDICATIONS USED:  NONE  SPECIMEN:  No Specimen  DISPOSITION OF SPECIMEN:  N/A  COUNTS:  YES  TOURNIQUET:  * No tourniquets in log *  DICTATION: .Note written in EPIC  PLAN OF CARE: Discharge to home after PACU  PATIENT DISPOSITION:  PACU - hemodynamically stable.   Delay start of Pharmacological VTE agent (>24hrs) due to surgical blood loss or risk of bleeding: yes

## 2019-06-13 NOTE — Op Note (Signed)
Date of procedure: 11.12.2020   Surgeon: [name], MD, PhD   Assistant: Ernest Mallick, OA   Pre-operative Diagnosis: Rhegmatogenous Retinal Detachment, Right Eye   Post-operative diagnosis: Rhegmatogenous Retinal Detachment, Right Eye   Anesthesia: GETA   Procedure: 1)     25 gauge pars plana vitrectomy, Right Eye CPT [id number]  2)     Perfluorocarbon injection, Right Eye 3)     Fluid-air exchange, Right Eye 3)     Endolaser, Right Eye 4)     Injection of 99991111 0000000 gas   Complications: none Estimated blood loss: minimal Specimens: none   Brief history:  The patient has a history of decreased vision in the affected eye, and on examination, was noted to have a retinal detachment, affecting activities of daily living.  The risks, benefits, and alternatives were explained to the patient, including pain, bleeding, infection, loss of vision, double vision, droopy eyelids, and need for more surgeries.  Informed consent was obtained from the patient and placed in the chart.     Procedure: The patient was brought to the preoperative holding area where the correct eye was confirmed and marked.  The patient was then brought to the operating room where general endotracheal anesthesia was induced. A secondary time-out was performed to identify the correct patient, eye, procedure, and any allergies. The eye was prepped and draped in the usual sterile ophthalmic fashion followed by placement of a lid speculum.  Initial inspection of the eye through microscope revealed his partially dislocated 3 piece IOL, which was stable in clinic, had shifted into a superotemporal position and was quite mobile. To minimize disturbance of the loose IOL, the first 25 gauge trocar was very carefully placed in the inferotemporal quadrant in a beveled fashion. A 4 mm infusion cannula was placed through this trocar. The infusion cannula was confirmed in the vitreous cavity with no incarceration of retina or choroid prior to  turning it on. Two additional 25 gauge trocars were placed in the superonasal and superotemporal quadrants (2 and 10 oclock, respectively) in a similar beveled fashion, very carefully. At this time, direct vitrectomy was performed just posterior to the IOL, which was slightly mobile, but remained posterior to the iris and did not fully dislocate. A standard three-port pars plana vitrectomy was then performed using the light pipe, the cutter, and the BIOM viewing system. A thorough core and peripheral vitreous dissection was performed with the assistance of intravitreal kenalog. A posterior vitreous detachment was confirmed over the optic nerve. There was a subtotal, bullous superior retina detachment from 9 to 5 oclock going clockwise, with the macula and fovea both involved. There was a horseshoe tear at 0200 oclock and a patch of pigmented lattice with multiple small holes in the 1030-1100 position, peripherally within the detached retina.     Perfluoron was injected to stabilize the posterior retina and push the subretinal fluid anteriorly towards the retinal breaks. Traction was carefully removed from all retinal breaks. The breaks were trimmed using the cutter to smooth the edges. A meticulous shave of the vitreous base was performed with gentle scleral depression given the unstable IOL. A complete fluid-air exchange was performed with a soft tip extrusion cannula over the 0200 oclock break. After completion of these maneuvers, the posterior pole and peripheral retina were noted to be flat. Under air, endolaser was applied around the 0200 retinal break, the patch of lattice at 1030, and 360 degrees to the peripheral retina.  At this time, the superotemporal trocar  was removed and sutured with 7-0 vicryl in an interrupted fashion.  A complete air to 14% C3F8 gas exchange was performed through the infusion cannula and vented through the superonasal trocar using the extrusion cannula. Of note, the IOL  exhibited some anterior tilt of the temporal optic edge, but remained posterior to the iris The superonasal trocar and inferotemporal infusion cannula and associated trocar were then removed and sutured with 7-0 vicryl in an interrupted fashion.   The eye was confirmed to be at a physiologic level by digital palpation. Subconjunctival injections of Antibiotic and kenalog were administered. The lid speculum and drapes were removed. Drops of an antibiotic, antihypertensives, and steroid were given. The eye was patched and shielded. The patient tolerated the procedure well without any intraoperative or immediate postoperative complications. The patient was taken to the recovery room in good condition. The patient was instructed to maintain a strict face-down position and will be seen by Dr. Coralyn Pear tomorrow morning in clinic.

## 2019-06-13 NOTE — Transfer of Care (Signed)
Immediate Anesthesia Transfer of Care Note  Patient: Cody Phelps  Procedure(s) Performed: PARS PLANA VITRECTOMY WITH 25 GAUGE WITH LASER AND GAS (Right Eye) PHOTOCOAGULATION WITH LASER (Right Eye) GAS/FLUID EXCHANGE (Right Eye) PERFLUORONE INJECTION (Right Eye)  Patient Location: PACU  Anesthesia Type:General  Level of Consciousness: oriented, drowsy and patient cooperative  Airway & Oxygen Therapy: Patient Spontanous Breathing and Patient connected to nasal cannula oxygen  Post-op Assessment: Report given to RN and Post -op Vital signs reviewed and stable  Post vital signs: Reviewed and stable  Last Vitals:  Vitals Value Taken Time  BP 130/66 06/13/19 1625  Temp    Pulse 49 06/13/19 1627  Resp 15 06/13/19 1627  SpO2 100 % 06/13/19 1627  Vitals shown include unvalidated device data.  Last Pain: There were no vitals filed for this visit.    Patients Stated Pain Goal: 3 ([id number])  Complications: No apparent anesthesia complications

## 2019-06-13 NOTE — Progress Notes (Signed)
[facility] Note  06/14/2019     CHIEF COMPLAINT Patient presents for Post-op Follow-up   HISTORY OF PRESENT ILLNESS: Cody Phelps is a 78 y.o. male who presents to the clinic today for:   HPI    Post-op Follow-up    In right eye.  Discomfort includes none.  Negative for pain, itching, foreign body sensation, tearing, discharge and floaters.  Vision is worse.  I, the attending physician,  performed the HPI with the patient and updated documentation appropriately.          Comments    Pt states he had a rough night, he was able to sleep off and on, he has been maintaining head down position "most of the time"       Last edited by [name], MD on 06/14/2019  8:09 AM. (History)    pt states last night was "long", he is having a hard time sleeping with his face down   Referring physician: Dem[place], [place], [address] [place],  [place] [address]  HISTORICAL INFORMATION:   Selected notes from the [facility] Referred by Dr. [name] for concern of RD OD LEE: 11.11.20 (J. 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 prednisoLONE acetate (PRED FORTE) 1 % ophthalmic suspension Place 1 drop into the right eye daily.   No current facility-administered medications for this visit.  (Ophthalmic Drugs)   Current Outpatient Medications (Other)  Medication Sig  . acetaminophen (TYLENOL) 325 MG tablet Take 1 tablet (325 mg total) by mouth every 4 (four) hours as needed (headache, pain).  Marland Kitchen amiodarone (PACERONE) 200 MG tablet Take 200 mg by mouth daily.  Marland Kitchen atorvastatin (LIPITOR) 80 MG tablet Take 1 tablet (80 mg total) by mouth daily at 6 PM. (Patient taking differently: Take 40 mg by mouth every evening. )  . colesevelam (WELCHOL) 625  MG tablet Take 625 mg by mouth 3 (three) times daily.   Marland Kitchen donepezil (ARICEPT) 10 MG tablet Take 10 mg by mouth every morning.   . fluticasone (FLONASE) 50 MCG/ACT nasal spray Place 1 spray into both nostrils 3 times/day as needed-between meals & bedtime for allergies or rhinitis.  Marland Kitchen insulin glargine (LANTUS SOLOSTAR) 100 UNIT/ML injection Inject 20-40 Units into the skin at bedtime as needed (for BGL >150).   . memantine (NAMENDA) 5 MG tablet Take 5 mg by mouth daily.   . metoprolol tartrate (LOPRESSOR) 25 MG tablet TAKE 1 TABLET BY MOUTH  DAILY (Patient taking differently: 12.5 mg 2 (two) times a day. )  . mirabegron ER (MYRBETRIQ) 50 MG TB24 tablet Take 50 mg by mouth every evening.   Marland Kitchen omega-3 acid ethyl esters (LOVAZA) 1 G capsule Take 2 g by mouth daily.   . Rivaroxaban (XARELTO) 15 MG TABS tablet Take 15 mg by mouth daily.   Marland Kitchen rOPINIRole (REQUIP) 2 MG tablet Take 2 mg by mouth 2 (two) times daily as needed (RLS). 1600 & at bedtime  . spironolactone (ALDACTONE) 25 MG tablet TAKE ONE-HALF TABLET BY  MOUTH DAILY (Patient taking differently: Take 12.5 mg by mouth daily. )  . tamsulosin (FLOMAX) 0.4 MG CAPS capsule Take 0.4 mg by mouth daily after supper.   . valsartan-hydrochlorothiazide (DIOVAN-HCT) 80-12.5 MG tablet TAKE 1 TABLET BY MOUTH  EVERY MORNING  .  vitamin B-12 (CYANOCOBALAMIN) 1000 MCG tablet Take 1,000 mcg by mouth 2 (two) times daily.   No current facility-administered medications for this visit.  (Other)      REVIEW OF SYSTEMS: ROS    Positive for: Neurological, Cardiovascular, Eyes   Negative for: Constitutional, Gastrointestinal, Skin, Genitourinary, Musculoskeletal, HENT, Endocrine, Respiratory, Psychiatric, Allergic/Imm, Heme/Lymph   Last edited by Debbrah Alar, COT on 06/14/2019  7:57 AM. (History)       ALLERGIES No Known Allergies  PAST MEDICAL HISTORY Past Medical History:  Diagnosis Date  . Acute anterior wall MI ([place]) 03/01/2018  . Acute combined  systolic and diastolic heart failure ([place]) 03/15/2018  . Bronchitis, acute 05/04/2012  . Burn   . Carotid stenosis, left followed by dr [name], cardiologist--- bilateral bruit per note   per dr [name] note 66/4403  LICA 47-42% stenosis per duplex 05-14-2014  . Cataracts, bilateral    surgery to remove cataracts  . Chronic diastolic CHF (congestive heart failure) ([place]) 09/21/2018  . Dementia ([place])   . Diverticulosis   . DM (diabetes mellitus) type 2, uncontrolled, with ketoacidosis ([place]) 11/25/2013  . ED (erectile dysfunction)   . First degree heart block   . GERD (gastroesophageal reflux disease)   . Heart murmur   . History of blood transfusion    with open heart surgery  . Hypertension    cardiologsit-  dr [name]  . Iron deficiency anemia   . Mixed hyperlipidemia   . Morbid obesity ([place])   . Myocardial infarction ([place])    02/28/18  . OA (osteoarthritis)    right hip  . OSA on CPAP    followed by dr dohmeier, uses CPAP  . PAF (paroxysmal atrial fibrillation) ([name])    a. on Eliquis  . Prostate cancer ([place])    dx 03-17-2017 (bx)  Stage T2a, Gleason 4+4, PSA  4.43, vol 32.32--  s/p radiatctive prostate seed implants 07-10-2017 then IMRT and ADT  . RLS (restless legs syndrome)   . S/P aortic valve replacement with bioprosthetic valve 05/30/2018   23 mm Edwards Inspiris Resilia stented bovine pericardial tissue valve  . 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 S/P Maze operation for atrial fibrillation 05/30/2018   Complete bilateral atrial lesion set using bipolar radiofrequency and cryothermy ablation with clipping of LA appendage  . Scoliosis   . Severe aortic stenosis   . Trigger finger   . Type 2 diabetes mellitus ([place])   . Wears partial dentures    upper and lower   Past Surgical History:  Procedure Laterality Date  . AORTIC VALVE REPLACEMENT N/A 05/30/2018   Procedure: AORTIC VALVE REPLACEMENT (AVR) using Inspiris Valve, Size 23;  Surgeon: [name], MD;   Location: [place];  Service: Open Heart Surgery;  Laterality: N/A;  . APPENDECTOMY  1988  . BILATERAL TOTAL ETHMOIDECTOMY AND SPHENOIDECTOMY  01-14-2009    dr bates   [facility]  . CARDIOVERSION N/A 08/10/2018   Procedure: CARDIOVERSION;  Surgeon: Adrian Prows, MD;  Location: [place];  Service: Cardiovascular;  Laterality: N/A;  . CARPAL TUNNEL RELEASE Bilateral 2013  . CATARACT EXTRACTION W/ INTRAOCULAR LENS  IMPLANT, BILATERAL  date?  . CORONARY ARTERY BYPASS GRAFT N/A 05/30/2018   Procedure: CORONARY ARTERY BYPASS GRAFTING (CABG) x one using left internal mammary artery;  Surgeon: [name], MD;  Location: [place];  Service: Open Heart Surgery;  Laterality: N/A;  . CORONARY BALLOON ANGIOPLASTY N/A 03/01/2018   Procedure:  CORONARY BALLOON ANGIOPLASTY;  Surgeon: Adrian Prows, MD;  Location: [place] [facility];  Service: Cardiovascular;  Laterality: N/A;  . CORONARY/GRAFT ACUTE MI REVASCULARIZATION N/A 03/01/2018   Procedure: Coronary/Graft Acute MI Revascularization;  Surgeon: Adrian Prows, MD;  Location: [place] [facility];  Service: Cardiovascular;  Laterality: N/A;  . CYSTOSCOPY  07/19/2017   Procedure: CYSTOSCOPY;  Surgeon: Nickie Retort, MD;  Location: [facility];  Service: Urology;;  No seeds found in [place]  . GAS/FLUID EXCHANGE Right 06/13/2019   Procedure: GAS/FLUID EXCHANGE;  Surgeon: [name], MD;  Location: [place];  Service: Ophthalmology;  Laterality: Right;  . LAMINECTOMY WITH POSTERIOR LATERAL ARTHRODESIS LEVEL 2 N/A 02/12/2014   Procedure: LUMBAR TWO-THREE,LUIMBAR THREE-FOUR LAMINECTOMY/FORAMINOTOMY;POSSIBLE POSTEROLATERAL ARTHRODESIS WITH AUTOGRAFT;  Surgeon: [name], MD;  Location: [facility];  Service: Neurosurgery;  Laterality: N/A;  . LEFT HEART CATH AND CORONARY ANGIOGRAPHY N/A 03/01/2018   Procedure: LEFT HEART CATH AND CORONARY ANGIOGRAPHY;  Surgeon: Adrian Prows, MD;  Location: [place] [facility];  Service: Cardiovascular;  Laterality: N/A;  .  MAZE N/A 05/30/2018   Procedure: MAZE;  Surgeon: [name], MD;  Location: [place];  Service: Open Heart Surgery;  Laterality: N/A;  . ORIF RIGHT ANKLE FX'S  05/05/2001   retained hardware  . PARS PLANA VITRECTOMY Right 06/13/2019   Procedure: PARS PLANA VITRECTOMY WITH 25 GAUGE WITH LASER AND GAS;  Surgeon: [name], MD;  Location: [place];  Service: Ophthalmology;  Laterality: Right;  . [place] INJECTION Right 06/13/2019   Procedure: PERFLUORONE INJECTION;  Surgeon: [name], MD;  Location: [place];  Service: Ophthalmology;  Laterality: Right;  . PHOTOCOAGULATION WITH LASER Right 06/13/2019   Procedure: PHOTOCOAGULATION WITH LASER;  Surgeon: [name], MD;  Location: [place];  Service: Ophthalmology;  Laterality: Right;  . PROSTATE BIOPSY  03-17-2017   dr Pilar Jarvis office  . RADIOACTIVE SEED IMPLANT N/A 07/19/2017   Procedure: RADIOACTIVE SEED IMPLANT/BRACHYTHERAPY IMPLANT;  Surgeon: Nickie Retort, MD;  Location: [facility];  Service: Urology;  Laterality: N/A;  69 seeds implanted  . SKIN GRAFT     face  . SPACE OAR INSTILLATION N/A 07/19/2017   Procedure: SPACE OAR INSTILLATION;  Surgeon: Nickie Retort, MD;  Location: [facility];  Service: Urology;  Laterality: N/A;  . TEE WITHOUT CARDIOVERSION  03/20/2012   Procedure: TRANSESOPHAGEAL ECHOCARDIOGRAM (TEE);  Surgeon: Laverda Page, MD;  Location: [facility];  Service: Cardiovascular;  Laterality: N/A;  normal LV; normal EF; normal RV; normal LA w/ left atrial appendage very small, normal function, interatrial septum intact without defect; normal RA; trace MR,TR, & PI; mild AV calcification and senile degeneration w/ mild stenosis, AVA 1.7cm^2;;   . TEE WITHOUT CARDIOVERSION N/A 05/30/2018   Procedure: TRANSESOPHAGEAL ECHOCARDIOGRAM (TEE);  Surgeon: [name], MD;  Location: [place];  Service: Open Heart Surgery;  Laterality: N/A;  . TOTAL HIP ARTHROPLASTY Right 10/23/2017    Procedure: TOTAL HIP ARTHROPLASTY ANTERIOR APPROACH;  Surgeon: Frederik Pear, MD;  Location: [place];  Service: Orthopedics;  Laterality: Right;  . TRANSTHORACIC ECHOCARDIOGRAM  01-11-2017   dr [name]  (per echo note, no significant change in seveity of AS, no other diagnostic change)   moderate concentric LVH, ef 41%, grade 1 diastolic dysfunction/  moderate LAE/  mild , grade 1 AR w/ moderate AV calcification, mild to moderate restricted AV leaflets w/ moderate AS, AVA 1.16cm^2, peak grandient 3mHg, mean grandient 357mg/  trace MR, mild  calcification MV annulus , mild MV leaflet calcification, mild MVS, peak grandient 4.38mHg, mean grandient 2.719mg/ trace TR    FAMILY HISTORY Family History  Problem Relation Age of Onset  . Stroke Mother   . Hypertension Mother   . Heart attack Father   . Heart murmur Father   . Hypertension Sister   . Colon cancer Neg Hx   . Stomach cancer Neg Hx   . Rectal cancer Neg Hx   . Pancreatic cancer Neg Hx     SOCIAL HISTORY Social History   Tobacco Use  . Smoking status: Former Smoker    Years: 1.00    Types: Cigarettes    Quit date: 02/04/1967    Years since quitting: 52.3  . Smokeless tobacco: Never Used  . Tobacco comment: smoked 1 q2-3 days  Substance Use Topics  . Alcohol use: Yes    Comment: occasional beer  . Drug use: No         OPHTHALMIC EXAM:  Base Eye Exam    Visual Acuity (Snellen - Linear)      Right Left   Dist [place] CF at face 20/20 -2   Dist ph [place] NI NI       Tonometry (Tonopen, 8:06 AM)      Right Left   Pressure 23 24       Tonometry #2 (Tonopen, 8:06 AM)      Right Left   Pressure 21 19       Pupils      Dark Light Shape React APD   Right 5 5 Round NR None   Left 2 1 Round Slow None       Visual Fields (Counting fingers)      Left Right    Full    Restrictions  Total superior temporal, inferior temporal, superior nasal, inferior nasal deficiencies       Extraocular Movement      Right Left    Full,  Ortho Full, Ortho       Neuro/Psych    Oriented x3: Yes   Mood/Affect: Normal       Dilation    Right eye: 1.0% Mydriacyl, 2.5% Phenylephrine @ 8:06 AM        Slit Lamp and Fundus Exam    Slit Lamp Exam      Right Left   Lids/Lashes Dermatochalasis - upper lid, mild Telangiectasia, Meibomian gland dysfunction Dermatochalasis - upper lid, Meibomian gland dysfunction   Conjunctiva/Sclera Temporal Pinguecula, Subconjunctival hemorrhage, pneumatic Chemosis temporally, sutures intact Temporal Pinguecula   Cornea 2-3+ Punctate epithelial erosions, +verticellata 1+ Punctate epithelial erosions, +verticellata, Debris in tear film   Anterior Chamber Moderate depth, 3+cell/pigment Deep and quiet   Iris Round and moderately dilated, focal bowing at 0730, scattered Transillumination defects 360 Round and dilated, scattered, 360 Transillumination defects   Lens 3 piece Posterior chamber intraocular lens displaced IN with mild anterior bowing,  3 piece Posterior chamber intraocular in good position   Vitreous post vitrectomy, good gas fill Vitreous syneresis, Posterior vitreous detachment       Fundus Exam      Right Left   Disc Pink and Sharp    C/D Ratio 0.4 0.4   Macula Hazy view; macula grossly flat    Vessels Vascular attenuation, mild Tortuousity    Periphery Grossly attached; ORIGINALLY: bullous superior retinal detachment from 1000-0130, another more shallow detachment lobe from 130-400, lattice and micro tears ST quadrant; Old retinal tear at 0900 with surrounding laser,  IMAGING AND PROCEDURES  Imaging and Procedures for _0 @           ASSESSMENT/PLAN:    ICD-10-CM   1. Right retinal detachment  H33.21   2. 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 Retinal edema  H35.81   4. History of repair of retinal tear by laser photocoagulation  Z98.890   5. Diabetes mellitus type 2 without retinopathy ([place])  E11.9   6. Essential hypertension  I10   7.  Hypertensive retinopathy of both eyes  H35.033   8. Pseudophakia of both eyes  Z96.1   9. Dislocation of intraocular lens, sequela  T85.22XS     1-3. Rhegmatogenous retinal detachment, right eye  - bullous, superior, mac off detachment, onset of foveal involvement 11.11.20 by history  - Bi-lobed superior detachment, superior lobe spanning 1000-0130, nasal lobe 0130-0400  - lattice degeneration with microtears noted at 1030  - now POD1 s/p PPV/PFC/EL/FAX/14% C3F8 OD, 11.12.20             - intraop: HST at 2 oclock was found; also detachment had progressed to subtotal detachment spanning 9 oclock to 6 oclock (going in clockwise direction); also severe zonular insufficiency with IOL very mobile throughout case -- luckily did not dislocate completely  - doing well this morning             - retina attached and in good position -- good laser in place  - IOL bowed anteriorly by gas             - IOP okay at 21              - start   PF 4x/day OD                          zymaxid QID OD                          Cosopt BID OD                         PSO ung QID OD              - cont face down positioning x3 days; avoid laying flat on back              - eye shield when sleeping              - post op drop and positioning instructions reviewed              - tylenol/ibuprofen for pain              - Rx given for breakthrough pain  - f/u 1 week  4. History of retinal defects s/p laser retinopexy OU with Dr. Zigmund Daniel in 2013  - OD laser at 0900  - OS laser at 1030  - stable  5. Diabetes mellitus, type 2 without retinopathy  - The incidence, risk factors for progression, natural history and treatment options for diabetic retinopathy  were discussed with patient.    - The need for close monitoring of blood glucose, blood pressure, and serum lipids, avoiding cigarette or any type of tobacco, and the need for long term follow up was also discussed with patient.  6,7. Hypertensive retinopathy  OU  - discussed importance of tight BP control  - monitor  8,9. Pseudophakia OU  -  s/p CE/IOL OU (Dr. [name])  - 3 piece IOL OD displaced inferiorly -- no significant pseudophakodonesis -- stable  - OS 3-piece IOL in good position  - monitor   Ophthalmic Meds Ordered this visit:  No orders of the defined types were placed in this encounter.      Return in about 1 week (around 06/21/2019) for POV, RD OD.  There are no Patient Instructions on file for this visit.   Explained the diagnoses, plan, and follow up with the patient and they expressed understanding.  Patient expressed understanding of the importance of proper follow up care.  This document serves as a record of services personally performed by Gardiner Sleeper, MD, PhD. It was created on their behalf by Ernest Mallick, OA, an ophthalmic assistant. The creation of this record is the provider's dictation and/or activities during the visit.    Electronically signed by: Ernest Mallick, OA 11.12.2020 11:36 PM    Gardiner Sleeper, M.D., Ph.D. Diseases & Surgery of the Retina and Vitreous [facility]  I have reviewed the above documentation for accuracy and completeness, and I agree with the above. Gardiner Sleeper, M.D., Ph.D. 06/16/19 11:36 PM   Abbreviations: M myopia (nearsighted); A astigmatism; H hyperopia (farsighted); P presbyopia; Mrx spectacle prescription;  CTL contact lenses; OD right eye; OS left eye; OU both eyes  XT exotropia; ET esotropia; PEK punctate epithelial keratitis; PEE punctate epithelial erosions; DES dry eye syndrome; MGD meibomian gland dysfunction; ATs artificial tears; PFAT's preservative free artificial tears; [place] nuclear sclerotic cataract; PSC posterior subcapsular cataract; ERM epi-retinal membrane; PVD posterior vitreous detachment; RD retinal detachment; DM diabetes mellitus; DR diabetic retinopathy; NPDR non-proliferative diabetic retinopathy; PDR proliferative diabetic  retinopathy; CSME clinically significant macular edema; DME diabetic macular edema; dbh dot blot hemorrhages; CWS cotton wool spot; POAG primary open angle glaucoma; C/D cup-to-disc ratio; HVF humphrey visual field; GVF goldmann visual field; OCT optical coherence tomography; IOP intraocular pressure; BRVO Branch retinal vein occlusion; CRVO central retinal vein occlusion; CRAO central retinal artery occlusion; BRAO branch retinal artery occlusion; RT retinal tear; SB scleral buckle; PPV pars plana vitrectomy; VH Vitreous hemorrhage; PRP panretinal laser photocoagulation; IVK intravitreal kenalog; VMT vitreomacular traction; MH Macular hole;  NVD neovascularization of the disc; NVE neovascularization elsewhere; AREDS age related eye disease study; ARMD age related macular degeneration; POAG primary open angle glaucoma; EBMD epithelial/anterior basement membrane dystrophy; ACIOL anterior chamber intraocular lens; IOL intraocular lens; PCIOL posterior chamber intraocular lens; Phaco/IOL phacoemulsification with intraocular lens placement; [place] photorefractive keratectomy; LASIK laser assisted in situ keratomileusis; HTN hypertension; DM diabetes mellitus; COPD chronic obstructive pulmonary disease

## 2019-06-13 NOTE — Anesthesia Postprocedure Evaluation (Signed)
Anesthesia Post Note  Patient: Cody Phelps  Procedure(s) Performed: PARS PLANA VITRECTOMY WITH 25 GAUGE WITH LASER AND GAS (Right Eye) PHOTOCOAGULATION WITH LASER (Right Eye) GAS/FLUID EXCHANGE (Right Eye) PERFLUORONE INJECTION (Right Eye)     Anesthesia Post Evaluation  Last Vitals:  Vitals:   06/13/19 1151  BP: (!) 175/62  Pulse: (!) 53  Temp: 36.7 C  SpO2: 98%    Last Pain: There were no vitals filed for this visit.               Kassady Laboy

## 2019-06-13 NOTE — Interval H&P Note (Signed)
History and Physical Interval Note:  06/13/2019 2:02 PM  Cody Phelps  has presented today for surgery, with the diagnosis of RIGHT RETINAL DETACHMENT.  The various methods of treatment have been discussed with the patient and family. After consideration of risks, benefits and other options for treatment, the patient has consented to  Procedure(s): PARS PLANA VITRECTOMY WITH 25 GAUGE WITH LASER AND GAS (Right) as a surgical intervention.  The patient's history has been reviewed, patient examined, no change in status, stable for surgery.  I have reviewed the patient's chart and labs.  Questions were answered to the patient's satisfaction.     [name]

## 2019-06-13 NOTE — Anesthesia Procedure Notes (Signed)
Procedure Name: Intubation Date/Time: 06/13/2019 2:28 PM Performed by: Jenne Campus, CRNA Pre-anesthesia Checklist: Patient identified, Emergency Drugs available, Suction available and Patient being monitored Patient Re-evaluated:Patient Re-evaluated prior to induction Oxygen Delivery Method: Circle System Utilized Preoxygenation: Pre-oxygenation with 100% oxygen Induction Type: IV induction Ventilation: Mask ventilation without difficulty Laryngoscope Size: Miller and 3 Grade View: Grade I Tube type: Oral Tube size: 7.5 mm Number of attempts: 1 Airway Equipment and Method: Stylet and Oral airway Placement Confirmation: ETT inserted through vocal cords under direct vision,  positive ETCO2 and breath sounds checked- equal and bilateral Secured at: 23 cm Tube secured with: Tape Dental Injury: Teeth and Oropharynx as per pre-operative assessment

## 2019-06-14 ENCOUNTER — Other Ambulatory Visit: Payer: Self-pay

## 2019-06-14 ENCOUNTER — Encounter (HOSPITAL_COMMUNITY): Payer: Self-pay | Admitting: Ophthalmology

## 2019-06-14 ENCOUNTER — Ambulatory Visit (INDEPENDENT_AMBULATORY_CARE_PROVIDER_SITE_OTHER): Payer: Medicare Other | Admitting: Ophthalmology

## 2019-06-14 DIAGNOSIS — H35411 Lattice degeneration of retina, right eye: Secondary | ICD-10-CM

## 2019-06-14 DIAGNOSIS — T8522XS Displacement of intraocular lens, sequela: Secondary | ICD-10-CM

## 2019-06-14 DIAGNOSIS — Z9889 Other specified postprocedural states: Secondary | ICD-10-CM

## 2019-06-14 DIAGNOSIS — E119 Type 2 diabetes mellitus without complications: Secondary | ICD-10-CM

## 2019-06-14 DIAGNOSIS — H3581 Retinal edema: Secondary | ICD-10-CM

## 2019-06-14 DIAGNOSIS — H3321 Serous retinal detachment, right eye: Secondary | ICD-10-CM

## 2019-06-14 DIAGNOSIS — H35033 Hypertensive retinopathy, bilateral: Secondary | ICD-10-CM

## 2019-06-14 DIAGNOSIS — Z961 Presence of intraocular lens: Secondary | ICD-10-CM

## 2019-06-14 DIAGNOSIS — I10 Essential (primary) hypertension: Secondary | ICD-10-CM

## 2019-06-18 ENCOUNTER — Inpatient Hospital Stay: Payer: Medicare Other

## 2019-06-18 NOTE — Progress Notes (Signed)
[facility] Note  06/21/2019     CHIEF COMPLAINT Patient presents for Post-op Follow-up   HISTORY OF PRESENT ILLNESS: Cody Phelps is a 78 y.o. male who presents to the clinic today for:   HPI    Post-op Follow-up    In right eye.  Discomfort includes none.  Negative for pain, itching, foreign body sensation, tearing, discharge and floaters.  Vision is stable.  I, the attending physician,  performed the HPI with the patient and updated documentation appropriately.          Comments    Pt states he is not in any pain, but his vision is still very blurry, he states he is having a hard time sleeping on his stomach, he is still using post op drops as directed       Last edited by [name], MD on 06/21/2019  8:25 AM. (History)    pt states this week was "long", he is having a hard time sleeping on his stomach, his wife states she thinks things are getting better, she states he can see light now   Referring physician: Demarco, [place], [place] [place],  [place] [id number]  HISTORICAL INFORMATION:   Selected notes from the [place] Referred by Dr. [name] for concern of RD OD LEE: 11.11.20 (J. 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 dorzolamide-timolol (COSOPT) 22.3-6.8 MG/ML ophthalmic solution Place 1 drop into the right eye 2 (two) times daily.  . prednisoLONE acetate (PRED FORTE) 1 % ophthalmic suspension Place 1 drop into the right eye 6 (six) times daily.   No current facility-administered medications for this visit.  (Ophthalmic Drugs)   Current Outpatient Medications (Other)  Medication Sig  . acetaminophen (TYLENOL) 325 MG tablet Take 1 tablet (325 mg total) by mouth every 4 (four) hours as needed  (headache, pain).  Marland Kitchen amiodarone (PACERONE) 200 MG tablet TAKE 1 TABLET BY MOUTH  DAILY  . atorvastatin (LIPITOR) 80 MG tablet Take 1 tablet (80 mg total) by mouth daily at 6 PM. (Patient taking differently: Take 40 mg by mouth every evening. )  . colesevelam (WELCHOL) 625 MG tablet Take 625 mg by mouth 3 (three) times daily.   Marland Kitchen donepezil (ARICEPT) 10 MG tablet Take 10 mg by mouth every morning.   Marland Kitchen esomeprazole (NEXIUM) 40 MG capsule Take 40 mg by mouth daily.  . fluticasone (FLONASE) 50 MCG/ACT nasal spray Place 1 spray into both nostrils 3 times/day as needed-between meals & bedtime for allergies or rhinitis.  Marland Kitchen gabapentin (NEURONTIN) 300 MG capsule Take 300 mg by mouth 3 (three) times daily.  . insulin glargine (LANTUS SOLOSTAR) 100 UNIT/ML injection Inject 20-40 Units into the skin at bedtime as needed (for BGL >150).   . memantine (NAMENDA) 5 MG tablet Take 5 mg by mouth daily.   . metoprolol tartrate (LOPRESSOR) 25 MG tablet TAKE 1 TABLET BY MOUTH  DAILY (Patient taking differently: 12.5 mg 2 (two) times a day. )  . mirabegron ER (MYRBETRIQ) 50 MG TB24 tablet Take 50 mg by mouth every evening.   . nabumetone (RELAFEN) 750 MG tablet   . omega-3 acid ethyl esters (LOVAZA) 1 G capsule Take 2 g by mouth daily.   Marland Kitchen rOPINIRole (REQUIP) 2 MG tablet Take 2  mg by mouth 2 (two) times daily as needed (RLS). 1600 & at bedtime  . spironolactone (ALDACTONE) 25 MG tablet TAKE ONE-HALF TABLET BY  MOUTH DAILY (Patient taking differently: Take 12.5 mg by mouth daily. )  . tamsulosin (FLOMAX) 0.4 MG CAPS capsule Take 0.4 mg by mouth daily after supper.   . valsartan-hydrochlorothiazide (DIOVAN-HCT) 80-12.5 MG tablet TAKE 1 TABLET BY MOUTH IN  THE MORNING  . vitamin B-12 (CYANOCOBALAMIN) 1000 MCG tablet Take 1,000 mcg by mouth 2 (two) times daily.  Alveda Reasons 15 MG TABS tablet TAKE 1 TABLET BY MOUTH  EVERY EVENING AFTER DINNER   No current facility-administered medications for this visit.  (Other)       REVIEW OF SYSTEMS: ROS    Positive for: Eyes   Negative for: Constitutional, Gastrointestinal, Neurological, Skin, Genitourinary, Musculoskeletal, HENT, Endocrine, Cardiovascular, Respiratory, Psychiatric, Allergic/Imm, Heme/Lymph   Last edited by Debbrah Alar, COT on 06/21/2019  8:14 AM. (History)       ALLERGIES No Known Allergies  PAST MEDICAL HISTORY Past Medical History:  Diagnosis Date  . Acute anterior wall MI ([place]) 03/01/2018  . Acute combined systolic and diastolic heart failure ([place]) 03/15/2018  . Bronchitis, acute 05/04/2012  . Burn   . Carotid stenosis, left followed by dr [name], cardiologist--- bilateral bruit per note   per dr [name] note [id number]  LICA 20-10% stenosis per duplex 05-14-2014  . Cataracts, bilateral    surgery to remove cataracts  . Chronic diastolic CHF (congestive heart failure) ([place]) 09/21/2018  . Dementia ([place])   . Diverticulosis   . DM (diabetes mellitus) type 2, uncontrolled, with ketoacidosis ([place]) 11/25/2013  . ED (erectile dysfunction)   . First degree heart block   . GERD (gastroesophageal reflux disease)   . Heart murmur   . History of blood transfusion    with open heart surgery  . Hypertension    cardiologsit-  dr [name]  . Iron deficiency anemia   . Mixed hyperlipidemia   . Morbid obesity ([place])   . Myocardial infarction ([place])    02/28/18  . OA (osteoarthritis)    right hip  . OSA on CPAP    followed by dr dohmeier, uses CPAP  . PAF (paroxysmal atrial fibrillation) ([name])    a. on Eliquis  . Prostate cancer ([place])    dx 03-17-2017 (bx)  Stage T2a, Gleason 4+4, PSA  4.43, vol 32.32--  s/p radiatctive prostate seed implants 07-10-2017 then IMRT and ADT  . RLS (restless legs syndrome)   . S/P aortic valve replacement with bioprosthetic valve 05/30/2018   23 mm Edwards Inspiris Resilia stented bovine pericardial tissue valve  . 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 S/P Maze operation for atrial fibrillation  05/30/2018   Complete bilateral atrial lesion set using bipolar radiofrequency and cryothermy ablation with clipping of LA appendage  . Scoliosis   . Severe aortic stenosis   . Trigger finger   . Type 2 diabetes mellitus ([place])   . Wears partial dentures    upper and lower   Past Surgical History:  Procedure Laterality Date  . AORTIC VALVE REPLACEMENT N/A 05/30/2018   Procedure: AORTIC VALVE REPLACEMENT (AVR) using Inspiris Valve, Size 23;  Surgeon: [name], MD;  Location: [place];  Service: Open Heart Surgery;  Laterality: N/A;  . APPENDECTOMY  1988  . BILATERAL TOTAL ETHMOIDECTOMY AND SPHENOIDECTOMY  01-14-2009    dr bates   [facility]  . CARDIOVERSION N/A  08/10/2018   Procedure: CARDIOVERSION;  Surgeon: Adrian Prows, MD;  Location: [place];  Service: Cardiovascular;  Laterality: N/A;  . CARPAL TUNNEL RELEASE Bilateral 2013  . CATARACT EXTRACTION W/ INTRAOCULAR LENS  IMPLANT, BILATERAL  date?  . CORONARY ARTERY BYPASS GRAFT N/A 05/30/2018   Procedure: CORONARY ARTERY BYPASS GRAFTING (CABG) x one using left internal mammary artery;  Surgeon: [name], MD;  Location: [place];  Service: Open Heart Surgery;  Laterality: N/A;  . CORONARY BALLOON ANGIOPLASTY N/A 03/01/2018   Procedure: CORONARY BALLOON ANGIOPLASTY;  Surgeon: Adrian Prows, MD;  Location: [place] [facility];  Service: Cardiovascular;  Laterality: N/A;  . CORONARY/GRAFT ACUTE MI REVASCULARIZATION N/A 03/01/2018   Procedure: Coronary/Graft Acute MI Revascularization;  Surgeon: Adrian Prows, MD;  Location: [place] [facility];  Service: Cardiovascular;  Laterality: N/A;  . CYSTOSCOPY  07/19/2017   Procedure: CYSTOSCOPY;  Surgeon: Nickie Retort, MD;  Location: [facility];  Service: Urology;;  No seeds found in [place]  . GAS/FLUID EXCHANGE Right 06/13/2019   Procedure: GAS/FLUID EXCHANGE;  Surgeon: [name], MD;  Location: [place];  Service: Ophthalmology;  Laterality: Right;  . LAMINECTOMY WITH POSTERIOR  LATERAL ARTHRODESIS LEVEL 2 N/A 02/12/2014   Procedure: LUMBAR TWO-THREE,LUIMBAR THREE-FOUR LAMINECTOMY/FORAMINOTOMY;POSSIBLE POSTEROLATERAL ARTHRODESIS WITH AUTOGRAFT;  Surgeon: [name], MD;  Location: [facility];  Service: Neurosurgery;  Laterality: N/A;  . LEFT HEART CATH AND CORONARY ANGIOGRAPHY N/A 03/01/2018   Procedure: LEFT HEART CATH AND CORONARY ANGIOGRAPHY;  Surgeon: Adrian Prows, MD;  Location: [place] [facility];  Service: Cardiovascular;  Laterality: N/A;  . MAZE N/A 05/30/2018   Procedure: MAZE;  Surgeon: [name], MD;  Location: [place];  Service: Open Heart Surgery;  Laterality: N/A;  . ORIF RIGHT ANKLE FX'S  05/05/2001   retained hardware  . PARS PLANA VITRECTOMY Right 06/13/2019   Procedure: PARS PLANA VITRECTOMY WITH 25 GAUGE WITH LASER AND GAS;  Surgeon: [name], MD;  Location: [facility];  Service: Ophthalmology;  Laterality: Right;  . [place] INJECTION Right 06/13/2019   Procedure: PERFLUORONE INJECTION;  Surgeon: [name], MD;  Location: [place];  Service: Ophthalmology;  Laterality: Right;  . PHOTOCOAGULATION WITH LASER Right 06/13/2019   Procedure: PHOTOCOAGULATION WITH LASER;  Surgeon: [name], MD;  Location: [place];  Service: Ophthalmology;  Laterality: Right;  . PROSTATE BIOPSY  03-17-2017   dr Pilar Jarvis office  . RADIOACTIVE SEED IMPLANT N/A 07/19/2017   Procedure: RADIOACTIVE SEED IMPLANT/BRACHYTHERAPY IMPLANT;  Surgeon: Nickie Retort, MD;  Location: [facility];  Service: Urology;  Laterality: N/A;  69 seeds implanted  . SKIN GRAFT     face  . SPACE OAR INSTILLATION N/A 07/19/2017   Procedure: SPACE OAR INSTILLATION;  Surgeon: Nickie Retort, MD;  Location: [facility];  Service: Urology;  Laterality: N/A;  . TEE WITHOUT CARDIOVERSION  03/20/2012   Procedure: TRANSESOPHAGEAL ECHOCARDIOGRAM (TEE);  Surgeon: Laverda Page, MD;  Location: [facility];  Service: Cardiovascular;  Laterality: N/A;   normal LV; normal EF; normal RV; normal LA w/ left atrial appendage very small, normal function, interatrial septum intact without defect; normal RA; trace MR,TR, & PI; mild AV calcification and senile degeneration w/ mild stenosis, AVA 1.7cm^2;;   . TEE WITHOUT CARDIOVERSION N/A 05/30/2018   Procedure: TRANSESOPHAGEAL ECHOCARDIOGRAM (TEE);  Surgeon: [name], MD;  Location: [place];  Service: Open Heart Surgery;  Laterality: N/A;  . TOTAL HIP ARTHROPLASTY Right 10/23/2017   Procedure:  TOTAL HIP ARTHROPLASTY ANTERIOR APPROACH;  Surgeon: Frederik Pear, MD;  Location: [place];  Service: Orthopedics;  Laterality: Right;  . TRANSTHORACIC ECHOCARDIOGRAM  01-11-2017   dr [name]  (per echo note, no significant change in seveity of AS, no other diagnostic change)   moderate concentric LVH, ef 41%, grade 1 diastolic dysfunction/  moderate LAE/  mild , grade 1 AR w/ moderate AV calcification, mild to moderate restricted AV leaflets w/ moderate AS, AVA 1.16cm^2, peak grandient 312mHg, mean grandient 348mg/  trace MR, mild calcification MV annulus , mild MV leaflet calcification, mild MVS, peak grandient 4.12m33m, mean grandient 2.7mm26m trace TR    FAMILY HISTORY Family History  Problem Relation Age of Onset  . Stroke Mother   . Hypertension Mother   . Heart attack Father   . Heart murmur Father   . Hypertension Sister   . Colon cancer Neg Hx   . Stomach cancer Neg Hx   . Rectal cancer Neg Hx   . Pancreatic cancer Neg Hx     SOCIAL HISTORY Social History   Tobacco Use  . Smoking status: Former Smoker    Years: 1.00    Types: Cigarettes    Quit date: 02/04/1967    Years since quitting: 52.4  . Smokeless tobacco: Never Used  . Tobacco comment: smoked 1 q2-3 days  Substance Use Topics  . Alcohol use: Yes    Comment: occasional beer  . Drug use: No         OPHTHALMIC EXAM:  Base Eye Exam    Visual Acuity (Snellen - Linear)      Right Left   Dist [place] CF at face 20/30   Dist ph [place] NI  20/20 -1       Tonometry (Tonopen, 8:22 AM)      Right Left   Pressure 17 16       Pupils      Dark Light Shape React APD   Right 5 5 Round NR None   Left 3 2 Round Slow None       Visual Fields      Left Right    Full    Restrictions  Total superior temporal, inferior temporal, superior nasal, inferior nasal deficiencies       Extraocular Movement      Right Left    Full, Ortho Full, Ortho       Neuro/Psych    Oriented x3: Yes   Mood/Affect: Normal       Dilation    Right eye: 1.0% Mydriacyl, 2.5% Phenylephrine @ 8:22 AM        Slit Lamp and Fundus Exam    Slit Lamp Exam      Right Left   Lids/Lashes Lid edema and errythema  Dermatochalasis - upper lid, Meibomian gland dysfunction   Conjunctiva/Sclera Temporal Pinguecula, Subconjunctival hemorrhage - minimal, sutures intact Temporal Pinguecula   Cornea 2-3+ Punctate epithelial erosions, +verticellata, 1+ Descemet's folds 1+ Punctate epithelial erosions, +verticellata, Debris in tear film   Anterior Chamber Moderate depth, 2+cell/pigment Deep and quiet   Iris Round and poorly dilated, mild anterior bowing, scattered Transillumination defects 360 Round and dilated, scattered, 360 Transillumination defects   Lens 3 piece Posterior chamber intraocular lens displaced inferiorly and anteriorly with mild anterior bowing 3 piece Posterior chamber intraocular in good position   Vitreous post vitrectomy, good gas fill Vitreous syneresis, Posterior vitreous detachment       Fundus Exam      Right Left  Disc Pink and Sharp    C/D Ratio 0.4 0.4   Macula Hazy view; macula grossly flat    Vessels Vascular attenuation, mild Tortuousity    Periphery Grossly attached; good view of superior retina -- attached, good laser in place; ORIGINALLY: bullous superior retinal detachment from 1000-0130, another more shallow detachment lobe from 130-400, lattice and micro tears ST quadrant; Old retinal tear at 0900 with surrounding laser,            IMAGING AND PROCEDURES  Imaging and Procedures for _0 @           ASSESSMENT/PLAN:    ICD-10-CM   1. Right retinal detachment  H33.21   2. 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 Retinal edema  H35.81   4. History of repair of retinal tear by laser photocoagulation  Z98.890   5. Diabetes mellitus type 2 without retinopathy ([place])  E11.9   6. Essential hypertension  I10   7. Hypertensive retinopathy of both eyes  H35.033   8. Pseudophakia of both eyes  Z96.1   9. Dislocation of intraocular lens, sequela  T85.22XS     1-3. Rhegmatogenous retinal detachment, right eye  - bullous, superior, mac off detachment, onset of foveal involvement 11.11.20 by history  - Bi-lobed superior detachment, superior lobe spanning 1000-0130, nasal lobe 0130-0400  - lattice degeneration with microtears noted at 1030  - now POW1 s/p PPV/PFC/EL/FAX/14% C3F8 OD, 11.12.20             - intraop: HST at 2 oclock was found; also detachment had progressed to subtotal detachment spanning 9 oclock to 6 oclock (going in clockwise direction); also severe zonular insufficiency with IOL very mobile throughout case -- luckily did not dislocate completely  - doing well              - retina attached and in good position -- good laser in place  - IOL bowed anteriorly by gas             - IOP okay at 17              - cont   PF 6x/day OD                          zymaxid QID OD -- okay to stop                         Cosopt BID OD                         PSO ung QID OD -- PRN / bedtime             - cont face down positioning 30/60 minutes; avoid laying flat on back              - eye shield when sleeping              - post op drop and positioning instructions reviewed              - tylenol/ibuprofen for pain              - Rx given for breakthrough pain  - f/u 2-3 weeks  4. History of retinal defects s/p laser retinopexy OU with Dr. Zigmund Daniel in 2013  - OD laser at 0900  - OS laser  at 1030  - stable  5. Diabetes mellitus, type 2 without retinopathy  - The incidence, risk factors for progression, natural history and treatment options for diabetic retinopathy  were discussed with patient.    - The need for close monitoring of blood glucose, blood pressure, and serum lipids, avoiding cigarette or any type of tobacco, and the need for long term follow up was also discussed with patient.  6,7. Hypertensive retinopathy OU  - discussed importance of tight BP control  - monitor  8,9. Pseudophakia OU  - s/p CE/IOL OU (Dr. [name])  - 3 piece IOL OD displaced inferiorly  - OS 3-piece IOL in good position  - monitor   Ophthalmic Meds Ordered this visit:  Meds ordered this encounter  Medications  . prednisoLONE acetate (PRED FORTE) 1 % ophthalmic suspension    Sig: Place 1 drop into the right eye 6 (six) times daily.    Dispense:  15 mL    Refill:  0  . dorzolamide-timolol (COSOPT) 22.3-6.8 MG/ML ophthalmic solution    Sig: Place 1 drop into the right eye 2 (two) times daily.    Dispense:  10 mL    Refill:  1       Return for f/u 2-3 weeks, POV, RD OD, DFE, OCT.  There are no Patient Instructions on file for this visit.   Explained the diagnoses, plan, and follow up with the patient and they expressed understanding.  Patient expressed understanding of the importance of proper follow up care.  This document serves as a record of services personally performed by Gardiner Sleeper, MD, PhD. It was created on their behalf by Ernest Mallick, OA, an ophthalmic assistant. The creation of this record is the provider's dictation and/or activities during the visit.    Electronically signed by: Ernest Mallick, OA 11.17.2020 5:01 PM    Gardiner Sleeper, M.D., Ph.D. Diseases & Surgery of the Retina and Vitreous [facility]  I have reviewed the above documentation for accuracy and completeness, and I agree with the above. Gardiner Sleeper, M.D., Ph.D.  06/22/19 5:01 PM    Abbreviations: M myopia (nearsighted); A astigmatism; H hyperopia (farsighted); P presbyopia; Mrx spectacle prescription;  CTL contact lenses; OD right eye; OS left eye; OU both eyes  XT exotropia; ET esotropia; PEK punctate epithelial keratitis; PEE punctate epithelial erosions; DES dry eye syndrome; MGD meibomian gland dysfunction; ATs artificial tears; PFAT's preservative free artificial tears; [place] nuclear sclerotic cataract; PSC posterior subcapsular cataract; ERM epi-retinal membrane; PVD posterior vitreous detachment; RD retinal detachment; DM diabetes mellitus; DR diabetic retinopathy; NPDR non-proliferative diabetic retinopathy; PDR proliferative diabetic retinopathy; CSME clinically significant macular edema; DME diabetic macular edema; dbh dot blot hemorrhages; CWS cotton wool spot; POAG primary open angle glaucoma; C/D cup-to-disc ratio; HVF humphrey visual field; GVF goldmann visual field; OCT optical coherence tomography; IOP intraocular pressure; BRVO Branch retinal vein occlusion; CRVO central retinal vein occlusion; CRAO central retinal artery occlusion; BRAO branch retinal artery occlusion; RT retinal tear; SB scleral buckle; PPV pars plana vitrectomy; VH Vitreous hemorrhage; PRP panretinal laser photocoagulation; IVK intravitreal kenalog; VMT vitreomacular traction; MH Macular hole;  NVD neovascularization of the disc; NVE neovascularization elsewhere; AREDS age related eye disease study; ARMD age related macular degeneration; POAG primary open angle glaucoma; EBMD epithelial/anterior basement membrane dystrophy; ACIOL anterior chamber intraocular lens; IOL intraocular lens; PCIOL posterior chamber intraocular lens; Phaco/IOL phacoemulsification with intraocular lens placement; [place] photorefractive keratectomy; LASIK laser assisted in situ keratomileusis; HTN hypertension; DM diabetes mellitus; COPD chronic  obstructive pulmonary disease

## 2019-06-21 ENCOUNTER — Encounter (INDEPENDENT_AMBULATORY_CARE_PROVIDER_SITE_OTHER): Payer: Self-pay | Admitting: Ophthalmology

## 2019-06-21 ENCOUNTER — Ambulatory Visit (INDEPENDENT_AMBULATORY_CARE_PROVIDER_SITE_OTHER): Payer: Medicare Other | Admitting: Ophthalmology

## 2019-06-21 ENCOUNTER — Other Ambulatory Visit: Payer: Self-pay | Admitting: Cardiology

## 2019-06-21 ENCOUNTER — Other Ambulatory Visit: Payer: Self-pay

## 2019-06-21 DIAGNOSIS — H35411 Lattice degeneration of retina, right eye: Secondary | ICD-10-CM

## 2019-06-21 DIAGNOSIS — T8522XS Displacement of intraocular lens, sequela: Secondary | ICD-10-CM

## 2019-06-21 DIAGNOSIS — H3581 Retinal edema: Secondary | ICD-10-CM

## 2019-06-21 DIAGNOSIS — I10 Essential (primary) hypertension: Secondary | ICD-10-CM

## 2019-06-21 DIAGNOSIS — Z9889 Other specified postprocedural states: Secondary | ICD-10-CM

## 2019-06-21 DIAGNOSIS — Z961 Presence of intraocular lens: Secondary | ICD-10-CM

## 2019-06-21 DIAGNOSIS — H3321 Serous retinal detachment, right eye: Secondary | ICD-10-CM

## 2019-06-21 DIAGNOSIS — E119 Type 2 diabetes mellitus without complications: Secondary | ICD-10-CM

## 2019-06-21 DIAGNOSIS — H35033 Hypertensive retinopathy, bilateral: Secondary | ICD-10-CM

## 2019-06-21 MED ORDER — PREDNISOLONE ACETATE 1 % OP SUSP: 1.0000 [drp] | Freq: Every day | OPHTHALMIC | 0 refills | Status: DC

## 2019-06-21 MED ORDER — DORZOLAMIDE HCL-TIMOLOL MAL 2-0.5 % OP SOLN: 1.0000 [drp] | Freq: Two times a day (BID) | OPHTHALMIC | 1 refills | Status: DC

## 2019-07-01 ENCOUNTER — Encounter: Payer: Medicare Other | Admitting: Thoracic Surgery (Cardiothoracic Vascular Surgery)

## 2019-07-05 ENCOUNTER — Telehealth (INDEPENDENT_AMBULATORY_CARE_PROVIDER_SITE_OTHER): Payer: Self-pay

## 2019-07-12 ENCOUNTER — Ambulatory Visit (INDEPENDENT_AMBULATORY_CARE_PROVIDER_SITE_OTHER): Payer: Medicare Other | Admitting: Ophthalmology

## 2019-07-12 ENCOUNTER — Other Ambulatory Visit: Payer: Self-pay

## 2019-07-12 ENCOUNTER — Encounter (INDEPENDENT_AMBULATORY_CARE_PROVIDER_SITE_OTHER): Payer: Self-pay | Admitting: Ophthalmology

## 2019-07-12 DIAGNOSIS — H3321 Serous retinal detachment, right eye: Secondary | ICD-10-CM

## 2019-07-12 DIAGNOSIS — H35411 Lattice degeneration of retina, right eye: Secondary | ICD-10-CM

## 2019-07-12 DIAGNOSIS — Z961 Presence of intraocular lens: Secondary | ICD-10-CM

## 2019-07-12 DIAGNOSIS — T8522XS Displacement of intraocular lens, sequela: Secondary | ICD-10-CM

## 2019-07-12 DIAGNOSIS — Z9889 Other specified postprocedural states: Secondary | ICD-10-CM

## 2019-07-12 DIAGNOSIS — H3581 Retinal edema: Secondary | ICD-10-CM

## 2019-07-12 DIAGNOSIS — H35033 Hypertensive retinopathy, bilateral: Secondary | ICD-10-CM

## 2019-07-12 DIAGNOSIS — I10 Essential (primary) hypertension: Secondary | ICD-10-CM

## 2019-07-12 DIAGNOSIS — E119 Type 2 diabetes mellitus without complications: Secondary | ICD-10-CM

## 2019-07-12 MED ORDER — PROLENSA 0.07 % OP SOLN: 1.0000 [drp] | Freq: Four times a day (QID) | OPHTHALMIC | 2 refills | Status: DC

## 2019-07-12 NOTE — Progress Notes (Signed)
[facility] Note  07/12/2019     CHIEF COMPLAINT Patient presents for Retina Follow Up   HISTORY OF PRESENT ILLNESS: Cody Phelps is a 78 y.o. male who presents to the clinic today for:   HPI    Retina Follow Up    Patient presents with  Retinal Break/Detachment.  In right eye.  This started 3 weeks ago.  Severity is moderate.  I, the attending physician,  performed the HPI with the patient and updated documentation appropriately.          Comments    Patient here for 3 weeks retina follow up for RD OD. Patient states vision is getting better. The gas bubble is below the middle. No eye pain.       Last edited by [name], MD on 07/14/2019  7:55 PM. (History)    pt states his vision is getting better  Referring physician: [name], MD [place],  [place] [address]  HISTORICAL INFORMATION:   Selected notes from the [facility] Referred by Dr. [name] for concern of RD OD LEE: 11.11.20 (J. 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 Bromfenac Sodium (PROLENSA) 0.07 % SOLN Place 1 drop into the right eye 4 (four) times daily.  . dorzolamide-timolol (COSOPT) 22.3-6.8 MG/ML ophthalmic solution Place 1 drop into the right eye 2 (two) times daily.  . prednisoLONE acetate (PRED FORTE) 1 % ophthalmic suspension Place 1 drop into the right eye 6 (six) times daily.   No current facility-administered medications for this visit. (Ophthalmic Drugs)   Current Outpatient Medications (Other)  Medication Sig  . acetaminophen (TYLENOL) 325 MG tablet Take 1 tablet (325 mg total) by mouth every 4 (four) hours as needed (headache, pain).  Marland Kitchen amiodarone (PACERONE) 200 MG tablet TAKE 1 TABLET BY MOUTH  DAILY  . atorvastatin  (LIPITOR) 80 MG tablet Take 1 tablet (80 mg total) by mouth daily at 6 PM. (Patient taking differently: Take 40 mg by mouth every evening. )  . colesevelam (WELCHOL) 625 MG tablet Take 625 mg by mouth 3 (three) times daily.   Marland Kitchen donepezil (ARICEPT) 10 MG tablet Take 10 mg by mouth every morning.   Marland Kitchen esomeprazole (NEXIUM) 40 MG capsule Take 40 mg by mouth daily.  . fluticasone (FLONASE) 50 MCG/ACT nasal spray Place 1 spray into both nostrils 3 times/day as needed-between meals & bedtime for allergies or rhinitis.  Marland Kitchen gabapentin (NEURONTIN) 300 MG capsule Take 300 mg by mouth 3 (three) times daily.  . insulin glargine (LANTUS SOLOSTAR) 100 UNIT/ML injection Inject 20-40 Units into the skin at bedtime as needed (for BGL >150).   . memantine (NAMENDA) 5 MG tablet Take 5 mg by mouth daily.   . metoprolol tartrate (LOPRESSOR) 25 MG tablet TAKE 1 TABLET BY MOUTH  DAILY (Patient taking differently: 12.5 mg 2 (two) times a day. )  . mirabegron ER (MYRBETRIQ) 50 MG TB24 tablet Take 50 mg by mouth every evening.   . nabumetone (RELAFEN) 750 MG tablet   . omega-3 acid ethyl esters (LOVAZA) 1 G capsule Take 2 g by mouth daily.   Marland Kitchen rOPINIRole (REQUIP) 2 MG tablet Take 2 mg by mouth 2 (two) times daily as needed (RLS). 1600 & at bedtime  . spironolactone (  ALDACTONE) 25 MG tablet TAKE ONE-HALF TABLET BY  MOUTH DAILY (Patient taking differently: Take 12.5 mg by mouth daily. )  . tamsulosin (FLOMAX) 0.4 MG CAPS capsule Take 0.4 mg by mouth daily after supper.   . valsartan-hydrochlorothiazide (DIOVAN-HCT) 80-12.5 MG tablet TAKE 1 TABLET BY MOUTH IN  THE MORNING  . vitamin B-12 (CYANOCOBALAMIN) 1000 MCG tablet Take 1,000 mcg by mouth 2 (two) times daily.  Alveda Reasons 15 MG TABS tablet TAKE 1 TABLET BY MOUTH  EVERY EVENING AFTER DINNER   No current facility-administered medications for this visit. (Other)      REVIEW OF SYSTEMS: ROS    Positive for: Endocrine, Cardiovascular, Eyes, Respiratory   Negative for:  Constitutional, Gastrointestinal, Neurological, Skin, Genitourinary, Musculoskeletal, HENT, Psychiatric, Allergic/Imm, Heme/Lymph   Last edited by [name], COA on 07/12/2019  1:23 PM. (History)       ALLERGIES No Known Allergies  PAST MEDICAL HISTORY Past Medical History:  Diagnosis Date  . Acute anterior wall MI ([place]) 03/01/2018  . Acute combined systolic and diastolic heart failure ([place]) 03/15/2018  . Bronchitis, acute 05/04/2012  . Burn   . Carotid stenosis, left followed by dr [name], cardiologist--- bilateral bruit per note   per dr [name] note 56/2563  LICA 89-37% stenosis per duplex 05-14-2014  . Cataracts, bilateral    surgery to remove cataracts  . Chronic diastolic CHF (congestive heart failure) ([place]) 09/21/2018  . Dementia ([place])   . Diverticulosis   . DM (diabetes mellitus) type 2, uncontrolled, with ketoacidosis ([place]) 11/25/2013  . ED (erectile dysfunction)   . First degree heart block   . GERD (gastroesophageal reflux disease)   . Heart murmur   . History of blood transfusion    with open heart surgery  . Hypertension    cardiologsit-  dr [name]  . Iron deficiency anemia   . Mixed hyperlipidemia   . Morbid obesity ([place])   . Myocardial infarction ([place])    02/28/18  . OA (osteoarthritis)    right hip  . OSA on CPAP    followed by dr dohmeier, uses CPAP  . PAF (paroxysmal atrial fibrillation) ([name])    a. on Eliquis  . Prostate cancer ([place])    dx 03-17-2017 (bx)  Stage T2a, Gleason 4+4, PSA  4.43, vol 32.32--  s/p radiatctive prostate seed implants 07-10-2017 then IMRT and ADT  . RLS (restless legs syndrome)   . S/P aortic valve replacement with bioprosthetic valve 05/30/2018   23 mm Edwards Inspiris Resilia stented bovine pericardial tissue valve  . 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 S/P Maze operation for atrial fibrillation 05/30/2018   Complete bilateral atrial lesion set using bipolar radiofrequency and cryothermy ablation with  clipping of LA appendage  . Scoliosis   . Severe aortic stenosis   . Trigger finger   . Type 2 diabetes mellitus ([place])   . Wears partial dentures    upper and lower   Past Surgical History:  Procedure Laterality Date  . AORTIC VALVE REPLACEMENT N/A 05/30/2018   Procedure: AORTIC VALVE REPLACEMENT (AVR) using Inspiris Valve, Size 23;  Surgeon: [name], MD;  Location: [facility];  Service: Open Heart Surgery;  Laterality: N/A;  . APPENDECTOMY  1988  . BILATERAL TOTAL ETHMOIDECTOMY AND SPHENOIDECTOMY  01-14-2009    dr bates   [facility]  . CARDIOVERSION N/A 08/10/2018   Procedure: CARDIOVERSION;  Surgeon: Adrian Prows, MD;  Location: [facility];  Service: Cardiovascular;  Laterality: N/A;  . CARPAL TUNNEL RELEASE Bilateral 2013  . CATARACT EXTRACTION W/ INTRAOCULAR LENS  IMPLANT, BILATERAL  date?  . CORONARY ARTERY BYPASS GRAFT N/A 05/30/2018   Procedure: CORONARY ARTERY BYPASS GRAFTING (CABG) x one using left internal mammary artery;  Surgeon: [name], MD;  Location: [facility];  Service: Open Heart Surgery;  Laterality: N/A;  . CORONARY BALLOON ANGIOPLASTY N/A 03/01/2018   Procedure: CORONARY BALLOON ANGIOPLASTY;  Surgeon: Adrian Prows, MD;  Location: [facility];  Service: Cardiovascular;  Laterality: N/A;  . CORONARY/GRAFT ACUTE MI REVASCULARIZATION N/A 03/01/2018   Procedure: Coronary/Graft Acute MI Revascularization;  Surgeon: Adrian Prows, MD;  Location: [facility];  Service: Cardiovascular;  Laterality: N/A;  . CYSTOSCOPY  07/19/2017   Procedure: CYSTOSCOPY;  Surgeon: Nickie Retort, MD;  Location: [facility];  Service: Urology;;  No seeds found in [place]  . GAS/FLUID EXCHANGE Right 06/13/2019   Procedure: GAS/FLUID EXCHANGE;  Surgeon: [name], MD;  Location: [facility];  Service: Ophthalmology;  Laterality: Right;  . LAMINECTOMY WITH POSTERIOR LATERAL ARTHRODESIS LEVEL 2 N/A 02/12/2014   Procedure: LUMBAR TWO-THREE,LUIMBAR THREE-FOUR  LAMINECTOMY/FORAMINOTOMY;POSSIBLE POSTEROLATERAL ARTHRODESIS WITH AUTOGRAFT;  Surgeon: [name], MD;  Location: [facility];  Service: Neurosurgery;  Laterality: N/A;  . LEFT HEART CATH AND CORONARY ANGIOGRAPHY N/A 03/01/2018   Procedure: LEFT HEART CATH AND CORONARY ANGIOGRAPHY;  Surgeon: Adrian Prows, MD;  Location: [facility];  Service: Cardiovascular;  Laterality: N/A;  . MAZE N/A 05/30/2018   Procedure: MAZE;  Surgeon: [name], MD;  Location: [facility];  Service: Open Heart Surgery;  Laterality: N/A;  . ORIF RIGHT ANKLE FX'S  05/05/2001   retained hardware  . PARS PLANA VITRECTOMY Right 06/13/2019   Procedure: PARS PLANA VITRECTOMY WITH 25 GAUGE WITH LASER AND GAS;  Surgeon: [name], MD;  Location: [facility];  Service: Ophthalmology;  Laterality: Right;  . [place] INJECTION Right 06/13/2019   Procedure: PERFLUORONE INJECTION;  Surgeon: [name], MD;  Location: [facility];  Service: Ophthalmology;  Laterality: Right;  . PHOTOCOAGULATION WITH LASER Right 06/13/2019   Procedure: PHOTOCOAGULATION WITH LASER;  Surgeon: [name], MD;  Location: [facility];  Service: Ophthalmology;  Laterality: Right;  . PROSTATE BIOPSY  03-17-2017   dr Pilar Jarvis office  . RADIOACTIVE SEED IMPLANT N/A 07/19/2017   Procedure: RADIOACTIVE SEED IMPLANT/BRACHYTHERAPY IMPLANT;  Surgeon: Nickie Retort, MD;  Location: [facility];  Service: Urology;  Laterality: N/A;  69 seeds implanted  . SKIN GRAFT     face  . SPACE OAR INSTILLATION N/A 07/19/2017   Procedure: SPACE OAR INSTILLATION;  Surgeon: Nickie Retort, MD;  Location: [facility];  Service: Urology;  Laterality: N/A;  . TEE WITHOUT CARDIOVERSION  03/20/2012   Procedure: TRANSESOPHAGEAL ECHOCARDIOGRAM (TEE);  Surgeon: Laverda Page, MD;  Location: [facility];  Service: Cardiovascular;  Laterality: N/A;  normal LV; normal EF; normal RV; normal LA w/ left atrial appendage very small, normal function,  interatrial septum intact without defect; normal RA; trace MR,TR, & PI; mild AV calcification and senile degeneration w/ mild stenosis, AVA 1.7cm^2;;   . TEE WITHOUT CARDIOVERSION N/A 05/30/2018   Procedure: TRANSESOPHAGEAL ECHOCARDIOGRAM (TEE);  Surgeon: [name], MD;  Location: [facility];  Service: Open Heart Surgery;  Laterality: N/A;  . TOTAL HIP ARTHROPLASTY Right 10/23/2017   Procedure: TOTAL HIP ARTHROPLASTY ANTERIOR APPROACH;  Surgeon: Frederik Pear, MD;  Location: [facility];  Service: Orthopedics;  Laterality: Right;  . TRANSTHORACIC ECHOCARDIOGRAM  01-11-2017   dr [name]  (per echo note, no significant change in seveity of AS, no other diagnostic change)   moderate concentric LVH, ef 40%, grade 1 diastolic dysfunction/  moderate LAE/  mild , grade 1 AR w/ moderate AV calcification, mild to moderate restricted AV leaflets w/ moderate AS, AVA 1.16cm^2, peak grandient 30mHg, mean grandient 322mg/  trace MR, mild calcification MV annulus , mild MV leaflet calcification, mild MVS, peak grandient 4.53m53m, mean grandient 2.7mm71m trace TR    FAMILY HISTORY Family History  Problem Relation Age of Onset  . Stroke Mother   . Hypertension Mother   . Heart attack Father   . Heart murmur Father   . Hypertension Sister   . Colon cancer Neg Hx   . Stomach cancer Neg Hx   . Rectal cancer Neg Hx   . Pancreatic cancer Neg Hx     SOCIAL HISTORY Social History   Tobacco Use  . Smoking status: Former Smoker    Years: 1.00    Types: Cigarettes    Quit date: 02/04/1967    Years since quitting: 52.4  . Smokeless tobacco: Never Used  . Tobacco comment: smoked 1 q2-3 days  Substance Use Topics  . Alcohol use: Yes    Comment: occasional beer  . Drug use: No         OPHTHALMIC EXAM:  Base Eye Exam    Visual Acuity (Snellen - Linear)      Right Left   Dist [place] 20/50 20/30 +2   Dist ph [place] 20/40 +2 20/20       Tonometry (Tonopen, 1:19 PM)      Right Left   Pressure 18 17       Pupils       Dark Light Shape React APD   Right 5 5 Round Minimal None   Left 4 3 Round Brisk None       Visual Fields (Counting fingers)      Left Right    Full Full       Extraocular Movement      Right Left    Full, Ortho Full, Ortho       Neuro/Psych    Oriented x3: Yes   Mood/Affect: Normal       Dilation    Both eyes: 1.0% Mydriacyl, 2.5% Phenylephrine @ 1:19 PM        Slit Lamp and Fundus Exam    Slit Lamp Exam      Right Left   Lids/Lashes Lid edema and errythema  Dermatochalasis - upper lid, Meibomian gland dysfunction   Conjunctiva/Sclera temporal Pinguecula, sutures dissolving Temporal Pinguecula   Cornea 2+ Punctate epithelial erosions, +verticellata, 1+ Descemet's folds, Arcus 1+ Punctate epithelial erosions, +verticellata, Debris in tear film   Anterior Chamber Moderate depth, 2+cell/pigment Deep and quiet   Iris Round and poorly dilated, mild anterior bowing, scattered Transillumination defects 360 Round and dilated, scattered, 360 Transillumination defects   Lens 3 piece Posterior chamber intraocular lens displaced inferiorly 3 piece Posterior chamber intraocular in good position   Vitreous post vitrectomy, 40% gas fill, +pigment Vitreous syneresis, Posterior vitreous detachment       Fundus Exam      Right Left   Disc Mild Pallor, Sharp rim Mild Pallor, Sharp rim, temporal PPP   C/D Ratio 0.3 0.4   Macula Flat. Re-attached, Blunted foveal reflex, RPE mottling and clumping Flat, Blunted foveal reflex, Retinal pigment epithelial mottling,  No heme or edema   Vessels Vascular attenuation, mild Tortuousity Vascular attenuation, Tortuous   Periphery attached, good 360 laser in place; ORIGINALLY: bullous superior retinal detachment from 1000-0130, another more shallow detachment lobe from 130-400, lattice and micro tears ST quadrant; Old retinal tear at 0900 with surrounding laser, Attached, peripheral laser scars at 1030           IMAGING AND PROCEDURES  Imaging  and Procedures for _0 @  OCT, Retina - OU - Both Eyes       Right Eye Quality was poor. Central Foveal Thickness: 319. Progression has improved. Findings include intraretinal fluid, normal foveal contour (Retina re-attached; Mild cystic changes centrally; patchy ORA).   Left Eye Quality was good. Central Foveal Thickness: 264. Progression has been stable. Findings include normal foveal contour, no IRF, no SRF.   Notes *Images captured and stored on drive  Diagnosis / Impression:  OD: Retina re-attached; Mild cystic changes centrally; patchy ORA OS: NFP, no IRF/SRF   Clinical management:  See below  Abbreviations: NFP - Normal foveal profile. CME - cystoid macular edema. PED - pigment epithelial detachment. IRF - intraretinal fluid. SRF - subretinal fluid. EZ - ellipsoid zone. ERM - epiretinal membrane. ORA - outer retinal atrophy. ORT - outer retinal tubulation. SRHM - subretinal hyper-reflective material                 ASSESSMENT/PLAN:    ICD-10-CM   1. Right retinal detachment  H33.21   2. 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 Retinal edema  H35.81 OCT, Retina - OU - Both Eyes  4. History of repair of retinal tear by laser photocoagulation  Z98.890   5. Diabetes mellitus type 2 without retinopathy ([place])  E11.9   6. Essential hypertension  I10   7. Hypertensive retinopathy of both eyes  H35.033   8. Pseudophakia of both eyes  Z96.1   9. Dislocation of intraocular lens, sequela  T85.22XS     1-3. Rhegmatogenous retinal detachment, right eye  - bullous, superior, mac off detachment, onset of foveal involvement 11.11.20 by history  - Bi-lobed superior detachment, superior lobe spanning 1000-0130, nasal lobe 0130-0400  - lattice degeneration with microtears noted at 1030  - now POW4 s/p PPV/PFC/EL/FAX/14% C3F8 OD, 11.12.20             - intraop: HST at 2 oclock was found; also detachment had progressed to subtotal detachment spanning 9 oclock to 6  oclock (going in clockwise direction); also severe zonular insufficiency with IOL very mobile throughout case -- luckily did not dislocate completely  - doing well              - retina attached and in good position -- good laser in place  - central cystic changes noted on exam and OCT today -- add Prolensa QID  - IOL displaced inferiorly but BCVA already 20/40!             - IOP okay at 18             - cont   PF 6x/day OD -- decrease to QID                         Cosopt BID OD                         PSO ung QID OD -- PRN / bedtime  -  start Prolensa QID OD as above             - cont face down positioning 30/60 minutes; avoid laying flat on back              - can d/c eye shield when sleeping              - post op drop and positioning instructions reviewed   - f/u 3-4 weeks  4. History of retinal defects s/p laser retinopexy OU with Dr. Zigmund Daniel in 2013  - OD laser at 0900  - OS laser at 1030  - stable  5. Diabetes mellitus, type 2 without retinopathy  - The incidence, risk factors for progression, natural history and treatment options for diabetic retinopathy  were discussed with patient.    - The need for close monitoring of blood glucose, blood pressure, and serum lipids, avoiding cigarette or any type of tobacco, and the need for long term follow up was also discussed with patient.  6,7. Hypertensive retinopathy OU  - discussed importance of tight BP control  - monitor  8,9. Pseudophakia OU  - s/p CE/IOL OU (Dr. [name])  - 3 piece IOL OD displaced inferiorly  - OS 3-piece IOL in good position  - monitor   Ophthalmic Meds Ordered this visit:  Meds ordered this encounter  Medications  . Bromfenac Sodium (PROLENSA) 0.07 % SOLN    Sig: Place 1 drop into the right eye 4 (four) times daily.    Dispense:  3 mL    Refill:  2       Return in about 4 weeks (around 08/09/2019) for f/u RD OD, DFE, OCT.  There are no Patient Instructions on file for this visit.   Explained  the diagnoses, plan, and follow up with the patient and they expressed understanding.  Patient expressed understanding of the importance of proper follow up care.  This document serves as a record of services personally performed by Gardiner Sleeper, MD, PhD. It was created on their behalf by Ernest Mallick, OA, an ophthalmic assistant. The creation of this record is the provider's dictation and/or activities during the visit.    Electronically signed by: Ernest Mallick, OA 12.11.2020 8:03 PM    Gardiner Sleeper, M.D., Ph.D. Diseases & Surgery of the Retina and Vitreous [facility]  I have reviewed the above documentation for accuracy and completeness, and I agree with the above. Gardiner Sleeper, M.D., Ph.D. 07/14/19 8:03 PM   Abbreviations: M myopia (nearsighted); A astigmatism; H hyperopia (farsighted); P presbyopia; Mrx spectacle prescription;  CTL contact lenses; OD right eye; OS left eye; OU both eyes  XT exotropia; ET esotropia; PEK punctate epithelial keratitis; PEE punctate epithelial erosions; DES dry eye syndrome; MGD meibomian gland dysfunction; ATs artificial tears; PFAT's preservative free artificial tears; [place] nuclear sclerotic cataract; PSC posterior subcapsular cataract; ERM epi-retinal membrane; PVD posterior vitreous detachment; RD retinal detachment; DM diabetes mellitus; DR diabetic retinopathy; NPDR non-proliferative diabetic retinopathy; PDR proliferative diabetic retinopathy; CSME clinically significant macular edema; DME diabetic macular edema; dbh dot blot hemorrhages; CWS cotton wool spot; POAG primary open angle glaucoma; C/D cup-to-disc ratio; HVF humphrey visual field; GVF goldmann visual field; OCT optical coherence tomography; IOP intraocular pressure; BRVO Branch retinal vein occlusion; CRVO central retinal vein occlusion; CRAO central retinal artery occlusion; BRAO branch retinal artery occlusion; RT retinal tear; SB scleral buckle; PPV pars plana  vitrectomy; VH Vitreous hemorrhage; PRP panretinal laser photocoagulation; IVK  intravitreal kenalog; VMT vitreomacular traction; MH Macular hole;  NVD neovascularization of the disc; NVE neovascularization elsewhere; AREDS age related eye disease study; ARMD age related macular degeneration; POAG primary open angle glaucoma; EBMD epithelial/anterior basement membrane dystrophy; ACIOL anterior chamber intraocular lens; IOL intraocular lens; PCIOL posterior chamber intraocular lens; Phaco/IOL phacoemulsification with intraocular lens placement; [place] photorefractive keratectomy; LASIK laser assisted in situ keratomileusis; HTN hypertension; DM diabetes mellitus; COPD chronic obstructive pulmonary disease

## 2019-07-14 ENCOUNTER — Encounter (INDEPENDENT_AMBULATORY_CARE_PROVIDER_SITE_OTHER): Payer: Self-pay | Admitting: Ophthalmology

## 2019-07-15 ENCOUNTER — Other Ambulatory Visit: Payer: Self-pay

## 2019-07-15 ENCOUNTER — Telehealth (INDEPENDENT_AMBULATORY_CARE_PROVIDER_SITE_OTHER): Payer: Medicare Other | Admitting: Thoracic Surgery (Cardiothoracic Vascular Surgery)

## 2019-07-15 DIAGNOSIS — Z8679 Personal history of other diseases of the circulatory system: Secondary | ICD-10-CM

## 2019-07-15 DIAGNOSIS — Z953 Presence of xenogenic heart valve: Secondary | ICD-10-CM

## 2019-07-15 DIAGNOSIS — Z951 Presence of aortocoronary bypass graft: Secondary | ICD-10-CM
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
## 2019-08-09 ENCOUNTER — Encounter (INDEPENDENT_AMBULATORY_CARE_PROVIDER_SITE_OTHER): Payer: Medicare Other | Admitting: Ophthalmology

## 2019-08-16 ENCOUNTER — Ambulatory Visit (INDEPENDENT_AMBULATORY_CARE_PROVIDER_SITE_OTHER): Payer: Medicare Other | Admitting: Ophthalmology

## 2019-08-16 ENCOUNTER — Encounter (INDEPENDENT_AMBULATORY_CARE_PROVIDER_SITE_OTHER): Payer: Self-pay | Admitting: Ophthalmology

## 2019-08-16 DIAGNOSIS — H3321 Serous retinal detachment, right eye: Secondary | ICD-10-CM

## 2019-08-16 DIAGNOSIS — H3581 Retinal edema: Secondary | ICD-10-CM | POA: Diagnosis not present

## 2019-08-16 DIAGNOSIS — H35033 Hypertensive retinopathy, bilateral: Secondary | ICD-10-CM

## 2019-08-16 DIAGNOSIS — E119 Type 2 diabetes mellitus without complications: Secondary | ICD-10-CM

## 2019-08-16 DIAGNOSIS — Z961 Presence of intraocular lens: Secondary | ICD-10-CM

## 2019-08-16 DIAGNOSIS — I10 Essential (primary) hypertension: Secondary | ICD-10-CM

## 2019-08-16 DIAGNOSIS — H35411 Lattice degeneration of retina, right eye: Secondary | ICD-10-CM

## 2019-08-16 DIAGNOSIS — T8522XS Displacement of intraocular lens, sequela: Secondary | ICD-10-CM

## 2019-08-16 DIAGNOSIS — Z9889 Other specified postprocedural states: Secondary | ICD-10-CM

## 2019-08-16 MED ORDER — PROLENSA 0.07 % OP SOLN: 1.0000 [drp] | Freq: Four times a day (QID) | OPHTHALMIC | 2 refills | Status: DC

## 2019-08-16 NOTE — Progress Notes (Signed)
[facility] Note  08/16/2019     CHIEF COMPLAINT Patient presents for Post-op Follow-up   HISTORY OF PRESENT ILLNESS: Cody Phelps is a 79 y.o. male who presents to the clinic today for:   HPI    Post-op Follow-up    In right eye.  Discomfort includes Negative for pain, itching, foreign body sensation, tearing, discharge, floaters and none.  Vision is improved.  I, the attending physician,  performed the HPI with the patient and updated documentation appropriately.          Comments    Patient states vision improved OD. Gas bubble gone OD. Using PF qid, cosopt bid, and prolensa qid OD. No new floaters or flashes. BS was 136 yesterday am. Last a1c unknown.        Last edited by [name], MD on 08/16/2019 10:37 PM. (History)    pt is with his wife today, they are getting their covid vaccines next week, pt states his gas bubble has been gone since last Friday  Referring physician: [name], MD [place],  [place] [address]  HISTORICAL INFORMATION:   Selected notes from the [facility] Referred by Dr. [name] for concern of RD OD LEE: 11.11.20 (J. 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 Bromfenac Sodium (PROLENSA) 0.07 % SOLN Place 1 drop into the right eye 4 (four) times daily.  . prednisoLONE acetate (PRED FORTE) 1 % ophthalmic suspension Place 1 drop into the right eye 6 (six) times daily. (Patient taking differently: Place 1 drop into the right eye 4 (four) times daily. )  . dorzolamide-timolol (COSOPT) 22.3-6.8 MG/ML ophthalmic solution Place 1 drop into the right eye 2 (two) times daily.   No current facility-administered medications for this visit. (Ophthalmic Drugs)   Current Outpatient Medications  (Other)  Medication Sig  . acetaminophen (TYLENOL) 325 MG tablet Take 1 tablet (325 mg total) by mouth every 4 (four) hours as needed (headache, pain).  Marland Kitchen amiodarone (PACERONE) 200 MG tablet TAKE 1 TABLET BY MOUTH  DAILY  . atorvastatin (LIPITOR) 80 MG tablet Take 1 tablet (80 mg total) by mouth daily at 6 PM. (Patient taking differently: Take 40 mg by mouth every evening. )  . colesevelam (WELCHOL) 625 MG tablet Take 625 mg by mouth 3 (three) times daily.   Marland Kitchen donepezil (ARICEPT) 10 MG tablet Take 10 mg by mouth every morning.   Marland Kitchen esomeprazole (NEXIUM) 40 MG capsule Take 40 mg by mouth daily.  . fluticasone (FLONASE) 50 MCG/ACT nasal spray Place 1 spray into both nostrils 3 times/day as needed-between meals & bedtime for allergies or rhinitis.  Marland Kitchen gabapentin (NEURONTIN) 300 MG capsule Take 300 mg by mouth 3 (three) times daily.  . insulin glargine (LANTUS SOLOSTAR) 100 UNIT/ML injection Inject 20-40 Units into the skin at bedtime as needed (for BGL >150).   . memantine (NAMENDA) 5 MG tablet Take 5 mg by mouth daily.   . metoprolol tartrate (LOPRESSOR) 25 MG tablet TAKE 1 TABLET BY MOUTH  DAILY (Patient taking differently: 12.5 mg 2 (two) times a day. )  . mirabegron ER (MYRBETRIQ) 50 MG TB24 tablet Take 50 mg by mouth every evening.   . nabumetone (RELAFEN) 750 MG tablet   . omega-3 acid ethyl esters (LOVAZA)  1 G capsule Take 2 g by mouth daily.   Marland Kitchen rOPINIRole (REQUIP) 2 MG tablet Take 2 mg by mouth 2 (two) times daily as needed (RLS). 1600 & at bedtime  . spironolactone (ALDACTONE) 25 MG tablet TAKE ONE-HALF TABLET BY  MOUTH DAILY (Patient taking differently: Take 12.5 mg by mouth daily. )  . tamsulosin (FLOMAX) 0.4 MG CAPS capsule Take 0.4 mg by mouth daily after supper.   . valsartan-hydrochlorothiazide (DIOVAN-HCT) 80-12.5 MG tablet TAKE 1 TABLET BY MOUTH IN  THE MORNING  . vitamin B-12 (CYANOCOBALAMIN) 1000 MCG tablet Take 1,000 mcg by mouth 2 (two) times daily.  Alveda Reasons 15 MG TABS tablet  TAKE 1 TABLET BY MOUTH  EVERY EVENING AFTER DINNER   No current facility-administered medications for this visit. (Other)      REVIEW OF SYSTEMS: ROS    Positive for: Endocrine, Cardiovascular, Eyes, Respiratory   Negative for: Constitutional, Gastrointestinal, Neurological, Skin, Genitourinary, Musculoskeletal, HENT, Psychiatric, Allergic/Imm, Heme/Lymph   Last edited by Roselee Nova D, COT on 08/16/2019  3:09 PM. (History)       ALLERGIES No Known Allergies  PAST MEDICAL HISTORY Past Medical History:  Diagnosis Date  . Acute anterior wall MI ([place]) 03/01/2018  . Acute combined systolic and diastolic heart failure ([place]) 03/15/2018  . Bronchitis, acute 05/04/2012  . Burn   . Carotid stenosis, left followed by dr [name], cardiologist--- bilateral bruit per note   per dr [name] note 50/0938  LICA 18-29% stenosis per duplex 05-14-2014  . Cataracts, bilateral    surgery to remove cataracts  . Chronic diastolic CHF (congestive heart failure) ([place]) 09/21/2018  . Dementia ([place])   . Diverticulosis   . DM (diabetes mellitus) type 2, uncontrolled, with ketoacidosis ([place]) 11/25/2013  . ED (erectile dysfunction)   . First degree heart block   . GERD (gastroesophageal reflux disease)   . Heart murmur   . History of blood transfusion    with open heart surgery  . Hypertension    cardiologsit-  dr [name]  . Iron deficiency anemia   . Mixed hyperlipidemia   . Morbid obesity ([place])   . Myocardial infarction ([place])    02/28/18  . OA (osteoarthritis)    right hip  . OSA on CPAP    followed by dr dohmeier, uses CPAP  . PAF (paroxysmal atrial fibrillation) ([name])    a. on Eliquis  . Prostate cancer ([place])    dx 03-17-2017 (bx)  Stage T2a, Gleason 4+4, PSA  4.43, vol 32.32--  s/p radiatctive prostate seed implants 07-10-2017 then IMRT and ADT  . RLS (restless legs syndrome)   . 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 S/P Maze operation for atrial fibrillation 05/30/2018   Complete bilateral atrial lesion set using bipolar radiofrequency and cryothermy ablation with clipping of LA appendage  . Scoliosis   . Severe aortic stenosis   . Trigger finger   . Type 2 diabetes mellitus ([place])   . Wears partial dentures    upper and lower   Past Surgical History:  Procedure Laterality Date  . AORTIC VALVE REPLACEMENT N/A 05/30/2018   Procedure: AORTIC VALVE REPLACEMENT (AVR) using Inspiris Valve, Size 23;  Surgeon: [name], MD;  Location: [place];  Service: Open Heart Surgery;  Laterality: N/A;  . APPENDECTOMY  1988  . BILATERAL  TOTAL ETHMOIDECTOMY AND SPHENOIDECTOMY  01-14-2009    dr bates   [facility]  . CARDIOVERSION N/A 08/10/2018   Procedure: CARDIOVERSION;  Surgeon: Adrian Prows, MD;  Location: [facility];  Service: Cardiovascular;  Laterality: N/A;  . CARPAL TUNNEL RELEASE Bilateral 2013  . CATARACT EXTRACTION W/ INTRAOCULAR LENS  IMPLANT, BILATERAL  date?  . CORONARY ARTERY BYPASS GRAFT N/A 05/30/2018   Procedure: CORONARY ARTERY BYPASS GRAFTING (CABG) x one using left internal mammary artery;  Surgeon: [name], MD;  Location: [place];  Service: Open Heart Surgery;  Laterality: N/A;  . CORONARY BALLOON ANGIOPLASTY N/A 03/01/2018   Procedure: CORONARY BALLOON ANGIOPLASTY;  Surgeon: Adrian Prows, MD;  Location: [place] [facility];  Service: Cardiovascular;  Laterality: N/A;  . CORONARY/GRAFT ACUTE MI REVASCULARIZATION N/A 03/01/2018   Procedure: Coronary/Graft Acute MI Revascularization;  Surgeon: Adrian Prows, MD;  Location: [place] [facility];  Service: Cardiovascular;  Laterality: N/A;  . CYSTOSCOPY  07/19/2017   Procedure: CYSTOSCOPY;  Surgeon: Nickie Retort, MD;  Location: [facility];  Service: Urology;;  No seeds found in [place]  . GAS/FLUID EXCHANGE Right 06/13/2019   Procedure: GAS/FLUID  EXCHANGE;  Surgeon: [name], MD;  Location: [place];  Service: Ophthalmology;  Laterality: Right;  . LAMINECTOMY WITH POSTERIOR LATERAL ARTHRODESIS LEVEL 2 N/A 02/12/2014   Procedure: LUMBAR TWO-THREE,LUIMBAR THREE-FOUR LAMINECTOMY/FORAMINOTOMY;POSSIBLE POSTEROLATERAL ARTHRODESIS WITH AUTOGRAFT;  Surgeon: [name], MD;  [facility]: [facility];  Service: Neurosurgery;  Laterality: N/A;  . LEFT HEART CATH AND CORONARY ANGIOGRAPHY N/A 03/01/2018   Procedure: LEFT HEART CATH AND CORONARY ANGIOGRAPHY;  Surgeon: Adrian Prows, MD;  Location: [place] [facility];  Service: Cardiovascular;  Laterality: N/A;  . MAZE N/A 05/30/2018   Procedure: MAZE;  Surgeon: [name], MD;  Location: [place];  Service: Open Heart Surgery;  Laterality: N/A;  . ORIF RIGHT ANKLE FX'S  05/05/2001   retained hardware  . PARS PLANA VITRECTOMY Right 06/13/2019   Procedure: PARS PLANA VITRECTOMY WITH 25 GAUGE WITH LASER AND GAS;  Surgeon: [name], MD;  Location: [place];  Service: Ophthalmology;  Laterality: Right;  . [place] INJECTION Right 06/13/2019   Procedure: PERFLUORONE INJECTION;  Surgeon: [name], MD;  Location: [facility];  Service: Ophthalmology;  Laterality: Right;  . PHOTOCOAGULATION WITH LASER Right 06/13/2019   Procedure: PHOTOCOAGULATION WITH LASER;  Surgeon: [name], MD;  Location: [place] [facility];  Service: Ophthalmology;  Laterality: Right;  . PROSTATE BIOPSY  03-17-2017   dr Pilar Jarvis office  . RADIOACTIVE SEED IMPLANT N/A 07/19/2017   Procedure: RADIOACTIVE SEED IMPLANT/BRACHYTHERAPY IMPLANT;  Surgeon: Nickie Retort, MD;  Location: [facility];  Service: Urology;  Laterality: N/A;  69 seeds implanted  . SKIN GRAFT     face  . SPACE OAR INSTILLATION N/A 07/19/2017   Procedure: SPACE OAR INSTILLATION;  Surgeon: Nickie Retort, MD;  Location: [place] [place] [facility];  Service: Urology;  Laterality: N/A;  . TEE WITHOUT CARDIOVERSION  03/20/2012   Procedure:  TRANSESOPHAGEAL ECHOCARDIOGRAM (TEE);  Surgeon: Laverda Page, MD;  Location: [facility];  Service: Cardiovascular;  Laterality: N/A;  normal LV; normal EF; normal RV; normal LA w/ left atrial appendage very small, normal function, interatrial septum intact without defect; normal RA; trace MR,TR, & PI; mild AV calcification and senile degeneration w/ mild stenosis, AVA 1.7cm^2;;   . TEE WITHOUT CARDIOVERSION N/A 05/30/2018   Procedure: TRANSESOPHAGEAL ECHOCARDIOGRAM (TEE);  Surgeon: [name], MD;  Location: [place];  Service: Open Heart Surgery;  Laterality: N/A;  . TOTAL HIP ARTHROPLASTY Right 10/23/2017   Procedure: TOTAL HIP ARTHROPLASTY ANTERIOR APPROACH;  Surgeon: Frederik Pear, MD;  Location: [facility];  Service: Orthopedics;  Laterality: Right;  . TRANSTHORACIC ECHOCARDIOGRAM  01-11-2017   dr [name]  (per echo note, no significant change in seveity of AS, no other diagnostic change)   moderate concentric LVH, ef 62%, grade 1 diastolic dysfunction/  moderate LAE/  mild , grade 1 AR w/ moderate AV calcification, mild to moderate restricted AV leaflets w/ moderate AS, AVA 1.16cm^2, peak grandient 62mHg, mean grandient 352mg/  trace MR, mild calcification MV annulus , mild MV leaflet calcification, mild MVS, peak grandient 4.65m28m, mean grandient 2.7mm69m trace TR    FAMILY HISTORY Family History  Problem Relation Age of Onset  . Stroke Mother   . Hypertension Mother   . Heart attack Father   . Heart murmur Father   . Hypertension Sister   . Colon cancer Neg Hx   . Stomach cancer Neg Hx   . Rectal cancer Neg Hx   . Pancreatic cancer Neg Hx     SOCIAL HISTORY Social History   Tobacco Use  . Smoking status: Former Smoker    Years: 1.00    Types: Cigarettes    Quit date: 02/04/1967    Years since quitting: 52.5  . Smokeless tobacco: Never Used  . Tobacco comment: smoked 1 q2-3 days  Substance Use Topics  . Alcohol use: Yes    Comment: occasional beer  . Drug use: No          OPHTHALMIC EXAM:  Base Eye Exam    Visual Acuity (Snellen - Linear)      Right Left   Dist [place] 20/40 -1 20/30 -2   Dist ph [place] 20/25 -2 20/20 -2       Tonometry (Tonopen, 3:22 PM)      Right Left   Pressure 15 14       Pupils      Dark Light Shape React APD   Right 5 4.5 Round Minimal None   Left 2 1.5 Round Minimal None       Visual Fields (Counting fingers)      Left Right    Full Full       Extraocular Movement      Right Left    Full, Ortho Full, Ortho       Neuro/Psych    Oriented x3: Yes   Mood/Affect: Normal       Dilation    Both eyes: 1.0% Mydriacyl, 2.5% Phenylephrine @ 3:22 PM        Slit Lamp and Fundus Exam    Slit Lamp Exam      Right Left   Lids/Lashes Lid edema and errythema  Dermatochalasis - upper lid, Meibomian gland dysfunction   Conjunctiva/Sclera temporal Pinguecula, sutures dissolving Temporal Pinguecula   Cornea 1-2+ inferior Punctate epithelial erosions, +verticellata - faded, Arcus, mild endo pigment 1+ Punctate epithelial erosions, +verticellata, trace endo pigment, Debris in tear film   Anterior Chamber Deep and quiet Deep and quiet   Iris Round and poorly dilated, mild anterior bowing, scattered Transillumination defects 360 Round and dilated, scattered, 360 Transillumination defects   Lens 3 piece Posterior chamber intraocular lens displaced inferiorly 3 piece Posterior chamber intraocular in good position, trace Posterior capsular opacification   Vitreous post vitrectomy, gas bubble gone, +pigment Vitreous syneresis, Posterior vitreous detachment       Fundus  Exam      Right Left   Disc Mild Pallor, Sharp rim Mild Pallor, Sharp rim, temporal PPP, mild tilt   C/D Ratio 0.3 0.4   Macula Flat, mild ERM superiorly, trace cystic changes centrally, Re-attached, Blunted foveal reflex, RPE mottling and clumping Flat, Blunted foveal reflex, Retinal pigment epithelial mottling and clumping, No heme or edema   Vessels Vascular  attenuation, AV crossing changes Vascular attenuation, Tortuous   Periphery attached, good 360 laser in place; ORIGINALLY: bullous superior retinal detachment from 1000-0130, another more shallow detachment lobe from 130-400, lattice and micro tears ST quadrant; Old retinal tear at 0900 with surrounding laser, Attached, peripheral laser scars at 1030         Refraction    Manifest Refraction      Sphere Cylinder Axis Dist VA   Right -1.25 +0.75 015 20/25   Left -1.00 +0.50 010 20/20-1          IMAGING AND PROCEDURES  Imaging and Procedures for _0 @  OCT, Retina - OU - Both Eyes       Right Eye Quality was poor. Central Foveal Thickness: 319. Progression has improved. Findings include intraretinal fluid, normal foveal contour, no SRF (Retina re-attached; interval improvement in foveal contour and cystic changes centrally; focal patches of decreased ellipsoid signal).   Left Eye Quality was good. Central Foveal Thickness: 265. Progression has been stable. Findings include normal foveal contour, no IRF, no SRF.   Notes *Images captured and stored on drive  Diagnosis / Impression:  OD: Retina re-attached; interval improvement in foveal contour and cystic changes centrally; focal patches of decreased ellipsoid signal OS: NFP, no IRF/SRF   Clinical management:  See below  Abbreviations: NFP - Normal foveal profile. CME - cystoid macular edema. PED - pigment epithelial detachment. IRF - intraretinal fluid. SRF - subretinal fluid. EZ - ellipsoid zone. ERM - epiretinal membrane. ORA - outer retinal atrophy. ORT - outer retinal tubulation. SRHM - subretinal hyper-reflective material                 ASSESSMENT/PLAN:    ICD-10-CM   1. Right retinal detachment  H33.21   2. 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 Retinal edema  H35.81 OCT, Retina - OU - Both Eyes  4. History of repair of retinal tear by laser photocoagulation  Z98.890   5. Diabetes mellitus  type 2 without retinopathy ([place])  E11.9   6. Essential hypertension  I10   7. Hypertensive retinopathy of both eyes  H35.033   8. Pseudophakia of both eyes  Z96.1   9. Dislocation of intraocular lens, sequela  T85.22XS     1-3. Rhegmatogenous retinal detachment, right eye  - bullous, superior, mac off detachment, onset of foveal involvement 11.11.20 by history  - Bi-lobed superior detachment, superior lobe spanning 1000-0130, nasal lobe 0130-0400  - lattice degeneration with microtears noted at 1030  - now POW4 s/p PPV/PFC/EL/FAX/14% C3F8 OD, 11.12.20             - intraop: HST at 2 oclock was found; also detachment had progressed to subtotal detachment spanning 9 oclock to 6 oclock (going in clockwise direction); also severe zonular insufficiency with IOL very mobile throughout case -- luckily did not dislocate completely  - doing well              - retina attached and in good position -- good laser in place  - central cystic changes noted on exam and  OCT today -- continue Prolensa  - IOL displaced inferiorly but BCVA 20/25!             - IOP 15             - cont   PF QID                         Cosopt BID OD   Prolensa QID OD as above              - post op drop instructions reviewed   - f/u 4 weeks  4. History of retinal defects s/p laser retinopexy OU with Dr. Zigmund Daniel in 2013  - OD laser at 0900  - OS laser at 1030  - stable  5. Diabetes mellitus, type 2 without retinopathy  - The incidence, risk factors for progression, natural history and treatment options for diabetic retinopathy  were discussed with patient.    - The need for close monitoring of blood glucose, blood pressure, and serum lipids, avoiding cigarette or any type of tobacco, and the need for long term follow up was also discussed with patient.  6,7. Hypertensive retinopathy OU  - discussed importance of tight BP control  - monitor  8,9. Pseudophakia OU  - s/p CE/IOL OU (Dr. [name])  - 3 piece IOL OD  displaced inferiorly  - OS 3-piece IOL in good position  - monitor   Ophthalmic Meds Ordered this visit:  Meds ordered this encounter  Medications  . Bromfenac Sodium (PROLENSA) 0.07 % SOLN    Sig: Place 1 drop into the right eye 4 (four) times daily.    Dispense:  6 mL    Refill:  2       Return in about 4 weeks (around 09/13/2019) for f/u RD OD, DFE, OCT.  There are no Patient Instructions on file for this visit.   Explained the diagnoses, plan, and follow up with the patient and they expressed understanding.  Patient expressed understanding of the importance of proper follow up care.  This document serves as a record of services personally performed by Gardiner Sleeper, MD, PhD. It was created on their behalf by Ernest Mallick, OA, an ophthalmic assistant. The creation of this record is the provider's dictation and/or activities during the visit.    Electronically signed by: Ernest Mallick, OA 01.15.2021 11:00 PM    Gardiner Sleeper, M.D., Ph.D. Diseases & Surgery of the Retina and Vitreous [facility]  I have reviewed the above documentation for accuracy and completeness, and I agree with the above. Gardiner Sleeper, M.D., Ph.D. 08/16/19 11:00 PM    Abbreviations: M myopia (nearsighted); A astigmatism; H hyperopia (farsighted); P presbyopia; Mrx spectacle prescription;  CTL contact lenses; OD right eye; OS left eye; OU both eyes  XT exotropia; ET esotropia; PEK punctate epithelial keratitis; PEE punctate epithelial erosions; DES dry eye syndrome; MGD meibomian gland dysfunction; ATs artificial tears; PFAT's preservative free artificial tears; [place] nuclear sclerotic cataract; PSC posterior subcapsular cataract; ERM epi-retinal membrane; PVD posterior vitreous detachment; RD retinal detachment; DM diabetes mellitus; DR diabetic retinopathy; NPDR non-proliferative diabetic retinopathy; PDR proliferative diabetic retinopathy; CSME clinically significant macular  edema; DME diabetic macular edema; dbh dot blot hemorrhages; CWS cotton wool spot; POAG primary open angle glaucoma; C/D cup-to-disc ratio; HVF humphrey visual field; GVF goldmann visual field; OCT optical coherence tomography; IOP intraocular pressure; BRVO Branch retinal vein occlusion; CRVO central retinal vein  occlusion; CRAO central retinal artery occlusion; BRAO branch retinal artery occlusion; RT retinal tear; SB scleral buckle; PPV pars plana vitrectomy; VH Vitreous hemorrhage; PRP panretinal laser photocoagulation; IVK intravitreal kenalog; VMT vitreomacular traction; MH Macular hole;  NVD neovascularization of the disc; NVE neovascularization elsewhere; AREDS age related eye disease study; ARMD age related macular degeneration; POAG primary open angle glaucoma; EBMD epithelial/anterior basement membrane dystrophy; ACIOL anterior chamber intraocular lens; IOL intraocular lens; PCIOL posterior chamber intraocular lens; Phaco/IOL phacoemulsification with intraocular lens placement; [place] photorefractive keratectomy; LASIK laser assisted in situ keratomileusis; HTN hypertension; DM diabetes mellitus; COPD chronic obstructive pulmonary disease

## 2019-08-21 ENCOUNTER — Ambulatory Visit: Payer: Medicare Other | Attending: Internal Medicine

## 2019-08-21 DIAGNOSIS — Z23 Encounter for immunization: Secondary | ICD-10-CM

## 2019-08-21 NOTE — Progress Notes (Signed)
   Covid-19 Vaccination Clinic  Name:  IRA YOUNGHANS    MRN: [id number] DOB: [birth year]  08/21/2019  Mr. Sartwell was observed post Covid-19 immunization for 15 minutes without incidence. He was provided with Vaccine Information Sheet and instruction to access the V-Safe system.   Mr. Goodgion was instructed to call 911 with any severe reactions post vaccine: Marland Kitchen Difficulty breathing  . Swelling of your face and throat  . A fast heartbeat  . A bad rash all over your body  . Dizziness and weakness    Immunizations Administered    Name Date Dose VIS Date Route   Pfizer COVID-19 Vaccine 08/21/2019 11:17 AM 0.3 mL 07/12/2019 Intramuscular   Manufacturer: [place]   Lot: [id number]   [place]: [id number]

## 2019-08-23 ENCOUNTER — Ambulatory Visit (INDEPENDENT_AMBULATORY_CARE_PROVIDER_SITE_OTHER): Payer: Medicare Other | Admitting: Cardiology

## 2019-08-23 ENCOUNTER — Encounter: Payer: Self-pay | Admitting: Cardiology

## 2019-08-23 ENCOUNTER — Other Ambulatory Visit: Payer: Self-pay

## 2019-08-23 VITALS — BP 140/59 | HR 54 | Ht 68.0 in | Wt 214.8 lb

## 2019-08-23 DIAGNOSIS — I480 Paroxysmal atrial fibrillation: Secondary | ICD-10-CM | POA: Diagnosis not present

## 2019-08-23 DIAGNOSIS — I10 Essential (primary) hypertension: Secondary | ICD-10-CM | POA: Diagnosis not present

## 2019-08-23 DIAGNOSIS — I25118 Atherosclerotic heart disease of native coronary artery with other forms of angina pectoris: Secondary | ICD-10-CM

## 2019-08-23 DIAGNOSIS — Z953 Presence of xenogenic heart valve: Secondary | ICD-10-CM

## 2019-08-23 NOTE — Progress Notes (Addendum)
Primary Physician/Referring:  [name], MD  Patient ID: Cody Phelps, male    DOB: 16-Feb-1941, 79 y.o.   MRN: [id number]  Chief Complaint  Patient presents with  . Coronary Artery Disease  . Atrial Fibrillation   HPI:    HPI: Cody Phelps  is a 79 y.o. Caucasian male h/o aortic valve replacement along with LIMA to D1 on 05/30/2018, invasive prostate cancer diagnosed in Nov 2018 and is S/P chemo and RT and in remission, anemia of chronic disease and also mild iron defeciency, hypertension, hyperlipidemia, obstructive sleep apnea on CPAP, controlled diabetes mellitus, mild obesity, mostly truncal obesity. 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  He is also noticed decreased exercise tolerance and dyspnea but denies any PND or orthopnea.  He also complains of worsening back pain and has spinal injections, does not want surgery.  His wife is present.  Past Medical History:  Diagnosis Date  . Acute anterior wall MI ([place]) 03/01/2018  . Acute combined systolic and diastolic heart failure ([place]) 03/15/2018  . Bronchitis, acute 05/04/2012  . Burn   . Carotid stenosis, left followed by dr [name], cardiologist--- bilateral bruit per note   per dr [name] note [id number]  LICA 84-85% stenosis per duplex 05-14-2014  . Cataracts, bilateral    surgery to remove cataracts  . Chronic diastolic CHF (congestive heart failure) ([place]) 09/21/2018  . Dementia ([place])   . Diverticulosis   . DM (diabetes mellitus) type 2, uncontrolled, with ketoacidosis ([place]) 11/25/2013  . ED (erectile dysfunction)   . First degree heart block   . GERD (gastroesophageal reflux disease)   . Heart murmur   . History of blood transfusion    with open heart surgery  . Hypertension    cardiologsit-  dr [name]  . Iron deficiency anemia   . Mixed hyperlipidemia   . Morbid obesity ([place])   .  Myocardial infarction ([place])    02/28/18  . OA (osteoarthritis)    right hip  . OSA on CPAP    followed by dr dohmeier, uses CPAP  . PAF (paroxysmal atrial fibrillation) ([name])    a. on Eliquis  . Prostate cancer ([place])    dx 03-17-2017 (bx)  Stage T2a, Gleason 4+4, PSA  4.43, vol 32.32--  s/p radiatctive prostate seed implants 07-10-2017 then IMRT and ADT  . RLS (restless legs syndrome)   . S/P aortic valve replacement with bioprosthetic valve 05/30/2018   23 mm Edwards Inspiris Resilia stented bovine pericardial tissue valve  . 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 S/P Maze operation for atrial fibrillation 05/30/2018   Complete bilateral atrial lesion set using bipolar radiofrequency and cryothermy ablation with clipping of LA appendage  . Scoliosis   . Severe aortic stenosis   . Trigger finger   . Type 2 diabetes mellitus ([place])   . Wears partial dentures    upper and lower   Past Surgical History:  Procedure Laterality Date  . AORTIC VALVE REPLACEMENT N/A 05/30/2018   Procedure: AORTIC VALVE REPLACEMENT (AVR) using Inspiris Valve, Size 23;  Surgeon: [name], MD;  Location: [facility];  Service: Open Heart Surgery;  Laterality: N/A;  . APPENDECTOMY  1988  . BILATERAL TOTAL ETHMOIDECTOMY AND SPHENOIDECTOMY  01-14-2009    dr bates   [facility]  . CARDIOVERSION N/A 08/10/2018  Procedure: CARDIOVERSION;  Surgeon: Adrian Prows, MD;  Location: [facility];  Service: Cardiovascular;  Laterality: N/A;  . CARPAL TUNNEL RELEASE Bilateral 2013  . CATARACT EXTRACTION W/ INTRAOCULAR LENS  IMPLANT, BILATERAL  date?  . CORONARY ARTERY BYPASS GRAFT N/A 05/30/2018   Procedure: CORONARY ARTERY BYPASS GRAFTING (CABG) x one using left internal mammary artery;  Surgeon: [name], MD;  Location: [facility];  Service: Open Heart Surgery;  Laterality: N/A;  . CORONARY BALLOON ANGIOPLASTY N/A 03/01/2018   Procedure: CORONARY BALLOON ANGIOPLASTY;  Surgeon: Adrian Prows, MD;  Location: [place] [facility];  Service: Cardiovascular;  Laterality: N/A;  . CORONARY/GRAFT ACUTE MI REVASCULARIZATION N/A 03/01/2018   Procedure: Coronary/Graft Acute MI Revascularization;  Surgeon: Adrian Prows, MD;  Location: [place];  Service: Cardiovascular;  Laterality: N/A;  . CYSTOSCOPY  07/19/2017   Procedure: CYSTOSCOPY;  Surgeon: Nickie Retort, MD;  Location: [facility];  Service: Urology;;  No seeds found in [place]  . GAS/FLUID EXCHANGE Right 06/13/2019   Procedure: GAS/FLUID EXCHANGE;  Surgeon: [name], MD;  Location: [facility];  Service: Ophthalmology;  Laterality: Right;  . LAMINECTOMY WITH POSTERIOR LATERAL ARTHRODESIS LEVEL 2 N/A 02/12/2014   Procedure: LUMBAR TWO-THREE,LUIMBAR THREE-FOUR LAMINECTOMY/FORAMINOTOMY;POSSIBLE POSTEROLATERAL ARTHRODESIS WITH AUTOGRAFT;  Surgeon: [name], MD;  Location: [facility];  Service: Neurosurgery;  Laterality: N/A;  . LEFT HEART CATH AND CORONARY ANGIOGRAPHY N/A 03/01/2018   Procedure: LEFT HEART CATH AND CORONARY ANGIOGRAPHY;  Surgeon: Adrian Prows, MD;  Location: [place] [facility];  Service: Cardiovascular;  Laterality: N/A;  . MAZE N/A 05/30/2018   Procedure: MAZE;  Surgeon: [name], MD;  Location: [facility];  Service: Open Heart Surgery;  Laterality: N/A;  . ORIF RIGHT ANKLE FX'S  05/05/2001   retained hardware  . PARS PLANA VITRECTOMY Right 06/13/2019   Procedure: PARS PLANA VITRECTOMY WITH 25 GAUGE WITH LASER AND GAS;  Surgeon: [name], MD;  Location: [facility];  Service: Ophthalmology;  Laterality: Right;  . [place] INJECTION Right 06/13/2019   Procedure: PERFLUORONE INJECTION;  Surgeon: [name], MD;  Location: [facility];  Service: Ophthalmology;  Laterality: Right;  . PHOTOCOAGULATION WITH LASER Right 06/13/2019   Procedure: PHOTOCOAGULATION WITH LASER;  Surgeon: [name], MD;  Location: [facility];  Service: Ophthalmology;  Laterality: Right;  . PROSTATE BIOPSY  03-17-2017   dr Pilar Jarvis office  .  RADIOACTIVE SEED IMPLANT N/A 07/19/2017   Procedure: RADIOACTIVE SEED IMPLANT/BRACHYTHERAPY IMPLANT;  Surgeon: Nickie Retort, MD;  Location: [facility];  Service: Urology;  Laterality: N/A;  69 seeds implanted  . SKIN GRAFT     face  . SPACE OAR INSTILLATION N/A 07/19/2017   Procedure: SPACE OAR INSTILLATION;  Surgeon: Nickie Retort, MD;  Location[facility];  Service: Urology;  Laterality: N/A;  . TEE WITHOUT CARDIOVERSION  03/20/2012   Procedure: TRANSESOPHAGEAL ECHOCARDIOGRAM (TEE);  Surgeon: Laverda Page, MD;  Location: [facility];  Service: Cardiovascular;  Laterality: N/A;  normal LV; normal EF; normal RV; normal LA w/ left atrial appendage very small, normal function, interatrial septum intact without defect; normal RA; trace MR,TR, & PI; mild AV calcification and senile degeneration w/ mild stenosis, AVA 1.7cm^2;;   . TEE WITHOUT CARDIOVERSION N/A 05/30/2018   Procedure: TRANSESOPHAGEAL ECHOCARDIOGRAM (TEE);  Surgeon: [name], MD;  Location: [facility];  Service: Open Heart Surgery;  Laterality: N/A;  . TOTAL HIP ARTHROPLASTY Right 10/23/2017   Procedure: TOTAL HIP ARTHROPLASTY  ANTERIOR APPROACH;  Surgeon: Frederik Pear, MD;  Location: [facility];  Service: Orthopedics;  Laterality: Right;  . TRANSTHORACIC ECHOCARDIOGRAM  01-11-2017   dr [name]  (per echo note, no significant change in seveity of AS, no other diagnostic change)   moderate concentric LVH, ef 46%, grade 1 diastolic dysfunction/  moderate LAE/  mild , grade 1 AR w/ moderate AV calcification, mild to moderate restricted AV leaflets w/ moderate AS, AVA 1.16cm^2, peak grandient 32mHg, mean grandient 312mg/  trace MR, mild calcification MV annulus , mild MV leaflet calcification, mild MVS, peak grandient 4.65m26m, mean grandient 2.7mm110m trace TR   Social History   Socioeconomic History  . Marital status: Married    Spouse name: Cody PennyNumber of children: 2  . Years of  education: 14  52Highest education level: Not on file  Occupational History  . Not on file  Tobacco Use  . Smoking status: Former Smoker    Years: 1.00    Types: Cigarettes    Quit date: 02/04/1967    Years since quitting: 52.5  . Smokeless tobacco: Never Used  . Tobacco comment: smoked 1 q2-3 days  Substance and Sexual Activity  . Alcohol use: Yes    Comment: occasional beer  . Drug use: No  . Sexual activity: Yes  Other Topics Concern  . Not on file  Social History Narrative   Patient is married (DonButch Phelps Patient has two children.   Patient is retired.   Patient has a college education.   Patient is right-handed.   Patient drinks two cups of coffee per day and 2-3 cups of Diet soda daily and limited tea.         Social Determinants of Health   Financial Resource Strain:   . Difficulty of Paying Living Expenses: Not on file  Food Insecurity:   . Worried About RunnCharity fundraiserthe Last Year: Not on file  . Ran Out of Food in the Last Year: Not on file  Transportation Needs:   . Lack of Transportation (Medical): Not on file  . Lack of Transportation (Non-Medical): Not on file  Physical Activity:   . Days of Exercise per Week: Not on file  . Minutes of Exercise per Session: Not on file  Stress:   . Feeling of Stress : Not on file  Social Connections:   . Frequency of Communication with Friends and Family: Not on file  . Frequency of Social Gatherings with Friends and Family: Not on file  . Attends Religious Services: Not on file  . Active Member of Clubs or Organizations: Not on file  . Attends ClubArchivisttings: Not on file  . Marital Status: Not on file  Intimate Partner Violence:   . Fear of Current or Ex-Partner: Not on file  . Emotionally Abused: Not on file  . Physically Abused: Not on file  . Sexually Abused: Not on file   ROS  Review of Systems  Constitution: Negative for chills, decreased appetite, malaise/fatigue and weight gain.    Cardiovascular: Positive for dyspnea on exertion (mild and stable) and leg swelling. Negative for syncope.  Endocrine: Negative for cold intolerance.  Hematologic/Lymphatic: Does not bruise/bleed easily.  Musculoskeletal: Positive for back pain (chronic, worsening). Negative for joint swelling.  Gastrointestinal: Negative for abdominal pain, anorexia, change in bowel habit, hematochezia and melena.  Neurological: Negative for headaches and light-headedness.  Psychiatric/Behavioral: Negative for depression and substance abuse.  All other systems reviewed  and are negative.  Objective  Blood pressure (!) 140/59, pulse (!) 54, height _0  (1.727 m), weight 214 lb 12.8 oz (97.4 kg), SpO2 97 %. Body mass index is 32.66 kg/m.   Vitals with BMI 08/23/2019 06/13/2019 06/13/2019  Height _1  - -  Weight 214 lbs 13 oz - -  BMI 34.03 - -  Systolic 709 643 838  Diastolic 59 64 66  Pulse 54 51 49    Physical Exam  Constitutional: He appears well-developed and well-nourished. No distress.  HENT:  Head: Atraumatic.  Eyes: Conjunctivae are normal.  Neck: No JVD present. No thyromegaly present.  Cardiovascular: Normal rate, regular rhythm and intact distal pulses. Exam reveals no gallop.  Murmur heard.  Early systolic murmur is present with a grade of 2/6 at the upper right sternal border radiating to the apex. Leg edema pitting 2+ bilateral below knee.   Pulmonary/Chest: Effort normal and breath sounds normal.  Abdominal: Soft. Bowel sounds are normal.  Musculoskeletal:        General: Normal range of motion.     Cervical back: Neck supple.  Neurological: He is alert.  Skin: Skin is warm and dry.  Psychiatric: He has a normal mood and affect.   Radiology: No results found.  Laboratory examination:   CMP Latest Ref Rng & Units 06/13/2019 06/02/2018 06/01/2018  Glucose 70 - 99 mg/dL 85 109(H) 146(H)  BUN 8 - 23 mg/dL _2 Creatinine 0.61 - 1.24 mg/dL 1.10 0.90 1.12  Sodium 135 -  145 mmol/L 138 136 138  Potassium 3.5 - 5.1 mmol/L 4.0 4.0 4.6  Chloride 98 - 111 mmol/L 108 110 107  CO2 22 - 32 mmol/L 21(L) 21(L) 21(L)  Calcium 8.9 - 10.3 mg/dL 9.3 8.0(L) 8.2(L)  Total Protein 6.5 - 8.1 g/dL - - -  Total Bilirubin 0.3 - 1.2 mg/dL - - -  Alkaline Phos 38 - 126 U/L - - -  AST 15 - 41 U/L - - -  ALT 0 - 44 U/L - - -   CBC Latest Ref Rng & Units 06/13/2019 05/28/2019 02/26/2019  WBC 4.0 - 10.5 K/uL 5.3 5.2 6.2  Hemoglobin 13.0 - 17.0 g/dL 9.7(L) 9.5(L) 9.5(L)  Hematocrit 39.0 - 52.0 % 31.1(L) 29.2(L) 29.4(L)  Platelets 150 - 400 K/uL 187 145(L) 168   Lipid Panel     Component Value Date/Time   CHOL 151 03/01/2018 0514   TRIG 69 03/01/2018 0514   HDL 55 03/01/2018 0514   CHOLHDL 2.7 03/01/2018 0514   VLDL 14 03/01/2018 0514   LDLCALC 82 03/01/2018 0514   HEMOGLOBIN A1C Lab Results  Component Value Date   HGBA1C 5.8 (H) 05/28/2018   MPG 119.76 05/28/2018   TSH No results for input(s): TSH in the last 8760 hours.   Labs 01/02/2019: A1c 5.8%.  Serum glucose 98 mg, BUN 16, creatinine 1.0, eGFR 72 mL, potassium 3.6, CMP otherwise normal.   HB 8.3/HCT 25.9, platelets 182, normal indicis.   Total cholesterol 98, triglycerides 57, HDL 42, LDL 45.  TSH normal.  Medications   Current Outpatient Medications  Medication Instructions  . amiodarone (PACERONE) 200 MG tablet TAKE 1 TABLET BY MOUTH  DAILY  . apixaban (ELIQUIS) 5 mg, Oral, 2 times daily  . atorvastatin (LIPITOR) 80 mg, Oral, Daily-1800  . Bromfenac Sodium (PROLENSA) 0.07 % SOLN 1 drop, Right Eye, 4 times daily  . colesevelam (WELCHOL) 625 mg, Oral, 3 times daily  . donepezil (ARICEPT) 10 mg,  Oral,  Every morning - 10a  . dorzolamide-timolol (COSOPT) 22.3-6.8 MG/ML ophthalmic solution 1 drop, Right Eye, 2 times daily  . esomeprazole (NEXIUM) 40 mg, Oral, As needed  . ferrous sulfate 325 mg, Oral, 3 times daily with meals  . fluticasone (FLONASE) 50 MCG/ACT nasal spray 1 spray, Each Nare, 2 times  daily between meals and at bedtime PRN  . gabapentin (NEURONTIN) 300 mg, Oral, 3 times daily  . insulin glargine (LANTUS SOLOSTAR) 20-40 Units, Subcutaneous, At bedtime PRN  . memantine (NAMENDA) 5 mg, Oral, 2 times daily  . metoprolol tartrate (LOPRESSOR) 25 MG tablet TAKE 1 TABLET BY MOUTH  DAILY  . mirabegron ER (MYRBETRIQ) 50 mg, Oral, Every evening  . nabumetone (RELAFEN) 750 MG tablet 2 times daily PRN  . omega-3 acid ethyl esters (LOVAZA) 2 g, Oral, Daily  . prednisoLONE acetate (PRED FORTE) 1 % ophthalmic suspension 1 drop, Right Eye, 6 times daily  . rOPINIRole (REQUIP) 2 mg, Oral, 2 times daily PRN, 1600 & at bedtime  . spironolactone (ALDACTONE) 25 MG tablet TAKE ONE-HALF TABLET BY  MOUTH DAILY  . tamsulosin (FLOMAX) 0.4 mg, Oral, Daily after supper  . torsemide (DEMADEX) 20 mg, Oral, 2 times daily  . valsartan-hydrochlorothiazide (DIOVAN-HCT) 80-12.5 MG tablet TAKE 1 TABLET BY MOUTH IN  THE MORNING  . vitamin B-12 (CYANOCOBALAMIN) 1,000 mcg, Oral, 2 times daily  . XARELTO 15 MG TABS tablet TAKE 1 TABLET BY MOUTH  EVERY EVENING AFTER DINNER   Cardiac Studies:   Echocardiogram 07/11/2018: Left ventricle cavity is normal in size. Abnormal septal wall motion due to post-operative coronary artery bypass graft. Diastolic function assessment limited due to mitral annular calcification and post op status. Calculated EF 65%. Left atrial cavity is mildly dilated. Well seated bioprosthetic aortic valve with normal functioning. Mean PG 8 mmHg likely normal for bioprosthetic valve. Moderate calcification of the mitral valve annulus and leaflets. No significant stenosis. Moderate (Grade II) mitral regurgitation. Compared to previous study on 08/24/2017, bioprosthetic aortic valve is new.   Coronary angiogram 03/20/2018: Left main mildly calcified but normal, LAD large caliber vessel, mild to moderate diffuse disease followed by occlusion in the mid to distal segment, distally the LAD appears to be  diffusely diseased with TIMI I flow.  Aborted PTCA. D-2 is moderate to  large sized with tandem 80 to 90% stenosis. Mild disease in other vessels. Severe aortic stenosis with peak to peak gradient of 69 and mean gradient of 53 mmHg.  Severely calcified mitral annulus and moderately calcified aortic valve.  Assessment     ICD-10-CM   1. Coronary artery disease of native artery of native heart with stable angina pectoris ([place])  I25.118   2. S/P aortic valve replacement with bioprosthetic valve  Z95.3   3. Paroxysmal atrial fibrillation ([place]).   I48.0 EKG 12-Lead  4. Primary hypertension  I10     EKG 08/23/2019: Probably sinus rhythm with first-degree AV block at rate of 54 bpm, inferior infarct old, anterolateral infarct old.  IVCD, borderline criteria for LVH.  EKG 02/20/2019: Atrial flutter with 3: 1 conduction, ventricular rate 56 bpm.  Inferior infarct old, anterolateral infarct old IVCD, LVH.  Normal QT interval.  No evidence of ischemia.  Abnormal EKG.  Recommendations:   Cody Phelps  is a 79 y.o. Caucasian male h/o aortic valve replacement along with LIMA to D1 on 05/30/2018, invasive prostate cancer diagnosed in Nov 2018 and is S/P chemo and RT and in remission, anemia of chronic disease  and also mild iron defeciency, hypertension, hyperlipidemia, obstructive sleep apnea on CPAP, controlled diabetes mellitus, mild obesity, mostly truncal obesity. 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  He is presently doing well and his shortness of breath is related to obesity hypoventilation, he has gained all his weight back.  No clinical evidence of congestive heart failure.  No changes in his EKG and he maintains sinus rhythm. BP is controlled.   Discussed with his wife again regarding making lifestyle changes.  Continue anticoagulation in view of high  cardioembolic risk. Not on aspirin due to stable CAD status and to reduce risk of bleeding.  I will see him back in 6 months.  CHA2DS2-VASc Score is 4.  Yearly risk of stroke: 4% (A, HTN, Vasc Dz)   Adrian Prows, MD, [facility] 08/24/2019, 6:31 PM [facility]

## 2019-08-24 ENCOUNTER — Encounter: Payer: Self-pay | Admitting: Cardiology

## 2019-08-29 ENCOUNTER — Inpatient Hospital Stay: Payer: Medicare Other | Attending: Oncology

## 2019-08-29 ENCOUNTER — Other Ambulatory Visit: Payer: Self-pay

## 2019-08-29 ENCOUNTER — Inpatient Hospital Stay (HOSPITAL_BASED_OUTPATIENT_CLINIC_OR_DEPARTMENT_OTHER): Payer: Medicare Other | Admitting: Oncology

## 2019-08-29 ENCOUNTER — Telehealth: Payer: Self-pay

## 2019-08-29 ENCOUNTER — Telehealth: Payer: Self-pay | Admitting: Oncology

## 2019-08-29 ENCOUNTER — Telehealth (INDEPENDENT_AMBULATORY_CARE_PROVIDER_SITE_OTHER): Payer: Self-pay

## 2019-08-29 VITALS — BP 154/46 | HR 55 | Temp 98.2°F | Resp 20 | Wt 213.6 lb

## 2019-08-29 DIAGNOSIS — I4891 Unspecified atrial fibrillation: Secondary | ICD-10-CM | POA: Diagnosis not present

## 2019-08-29 DIAGNOSIS — Z794 Long term (current) use of insulin: Secondary | ICD-10-CM | POA: Diagnosis not present

## 2019-08-29 DIAGNOSIS — Z7901 Long term (current) use of anticoagulants: Secondary | ICD-10-CM | POA: Insufficient documentation

## 2019-08-29 DIAGNOSIS — C61 Malignant neoplasm of prostate: Secondary | ICD-10-CM | POA: Insufficient documentation

## 2019-08-29 DIAGNOSIS — Z923 Personal history of irradiation: Secondary | ICD-10-CM | POA: Diagnosis not present

## 2019-08-29 DIAGNOSIS — D649 Anemia, unspecified: Secondary | ICD-10-CM | POA: Diagnosis present

## 2019-08-29 DIAGNOSIS — Z79899 Other long term (current) drug therapy: Secondary | ICD-10-CM | POA: Diagnosis not present

## 2019-08-29 LAB — FERRITIN: Ferritin: 246 ng/mL (ref 24–336)

## 2019-08-29 LAB — CBC WITH DIFFERENTIAL (CANCER CENTER ONLY)
Abs Immature Granulocytes: 0.03 10*3/uL (ref 0.00–0.07)
Basophils Absolute: 0.1 10*3/uL (ref 0.0–0.1)
Basophils Relative: 1 %
Eosinophils Absolute: 0.2 10*3/uL (ref 0.0–0.5)
Eosinophils Relative: 3 %
HCT: 33 % — ABNORMAL LOW (ref 39.0–52.0)
Hemoglobin: 11 g/dL — ABNORMAL LOW (ref 13.0–17.0)
Immature Granulocytes: 1 %
Lymphocytes Relative: 15 %
Lymphs Abs: 0.9 10*3/uL (ref 0.7–4.0)
MCH: 29.6 pg (ref 26.0–34.0)
MCHC: 33.3 g/dL (ref 30.0–36.0)
MCV: 88.9 fL (ref 80.0–100.0)
Monocytes Absolute: 0.6 10*3/uL (ref 0.1–1.0)
Monocytes Relative: 10 %
Neutro Abs: 4.3 10*3/uL (ref 1.7–7.7)
Neutrophils Relative %: 70 %
Platelet Count: 173 10*3/uL (ref 150–400)
RBC: 3.71 MIL/uL — ABNORMAL LOW (ref 4.22–5.81)
RDW: 14.7 % (ref 11.5–15.5)
WBC Count: 6.1 10*3/uL (ref 4.0–10.5)
nRBC: 0 % (ref 0.0–0.2)

## 2019-08-29 LAB — IRON AND TIBC
Iron: 52 ug/dL (ref 42–163)
Saturation Ratios: 17 % — ABNORMAL LOW (ref 20–55)
TIBC: 315 ug/dL (ref 202–409)
UIBC: 263 ug/dL (ref 117–376)

## 2019-08-29 NOTE — Telephone Encounter (Signed)
-----   Message from Wyatt Portela, MD sent at 08/29/2019 11:07 AM EST ----- Please let him know his iron level is normal.  He is recommended to continue with once a day oral iron.

## 2019-08-29 NOTE — Progress Notes (Signed)
Hematology and Oncology Follow Up Visit  WADE BATDORF [id number] [birth year] 79 y.o. 08/29/2019 8:53 AM [name], MDSouth, [name], MD   Principle Diagnosis: 79 year old man with:    1.  Multifactorial anemia diagnosed in 2019.  He has element of iron deficiency, chronic disease and effective radiation therapy.  2.  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  .  Prior Therapy:  Definitive radiation therapy utilizing IMRT between August 15, 2017 and September 18, 2017.  He also received brachytherapy boost upfront on January 4 of 2019.  He received 45 Gy in 25 fraction to supplement his post implant of 110 Gy.  He completed androgen deprivation therapy under the care of Dr. Junious Silk for close to 2 years.  He status post IV iron infusion in August 2019.  He received Feraheme in November 2020.  Current therapy: Active surveillance.  Interim History: Mr. Haenel returns today for a repeat evaluation.  Since the last visit, he reports no major changes in his health.  He tolerated intravenous iron infusion in November 2020 without any complications.  He felt improvement in his performance status and quality of life.  He denies any nausea, vomiting or abdominal pain.  He denies any hematochezia or melena.  He denies any dyspnea on exertion.        Medications: Unchanged on review. Current Outpatient Medications  Medication Sig Dispense Refill  . amiodarone (PACERONE) 200 MG tablet TAKE 1 TABLET BY MOUTH  DAILY 90 tablet 3  . apixaban (ELIQUIS) 5 MG TABS tablet Take 5 mg by mouth 2 (two) times daily.    Marland Kitchen atorvastatin (LIPITOR) 80 MG tablet Take 1 tablet (80 mg total) by mouth daily at 6 PM. (Patient taking differently: Take 80 mg by mouth every evening. ) 60 tablet 3  . Bromfenac Sodium (PROLENSA) 0.07 % SOLN Place 1 drop into the right eye 4 (four) times daily. 6 mL 2  . colesevelam (WELCHOL) 625 MG tablet Take 625 mg by mouth 3 (three)  times daily.     Marland Kitchen donepezil (ARICEPT) 10 MG tablet Take 10 mg by mouth every morning.     . dorzolamide-timolol (COSOPT) 22.3-6.8 MG/ML ophthalmic solution Place 1 drop into the right eye 2 (two) times daily. 10 mL 1  . esomeprazole (NEXIUM) 40 MG capsule Take 40 mg by mouth as needed.     . ferrous sulfate 325 (65 FE) MG tablet Take 325 mg by mouth 3 (three) times daily with meals.    . fluticasone (FLONASE) 50 MCG/ACT nasal spray Place 1 spray into both nostrils 3 times/day as needed-between meals & bedtime for allergies or rhinitis.    Marland Kitchen gabapentin (NEURONTIN) 300 MG capsule Take 300 mg by mouth 3 (three) times daily.    . insulin glargine (LANTUS SOLOSTAR) 100 UNIT/ML injection Inject 20-40 Units into the skin at bedtime as needed (for BGL >150).     . memantine (NAMENDA) 5 MG tablet Take 5 mg by mouth 2 (two) times daily.     . metoprolol tartrate (LOPRESSOR) 25 MG tablet TAKE 1 TABLET BY MOUTH  DAILY (Patient taking differently: 12.5 mg 2 (two) times a day. ) 90 tablet 3  . mirabegron ER (MYRBETRIQ) 50 MG TB24 tablet Take 50 mg by mouth every evening.     . nabumetone (RELAFEN) 750 MG tablet 2 (two) times daily as needed.     Marland Kitchen omega-3 acid ethyl esters (LOVAZA) 1 G capsule  Take 2 g by mouth daily.     . prednisoLONE acetate (PRED FORTE) 1 % ophthalmic suspension Place 1 drop into the right eye 6 (six) times daily. (Patient taking differently: Place 1 drop into the right eye 4 (four) times daily. ) 15 mL 0  . rOPINIRole (REQUIP) 2 MG tablet Take 2 mg by mouth 2 (two) times daily as needed (RLS). 1600 & at bedtime    . spironolactone (ALDACTONE) 25 MG tablet TAKE ONE-HALF TABLET BY  MOUTH DAILY (Patient taking differently: Take 12.5 mg by mouth daily. ) 45 tablet 2  . tamsulosin (FLOMAX) 0.4 MG CAPS capsule Take 0.4 mg by mouth daily after supper.     . torsemide (DEMADEX) 20 MG tablet Take 20 mg by mouth 2 (two) times daily.    . valsartan-hydrochlorothiazide (DIOVAN-HCT) 80-12.5 MG tablet  TAKE 1 TABLET BY MOUTH IN  THE MORNING 90 tablet 3  . vitamin B-12 (CYANOCOBALAMIN) 1000 MCG tablet Take 1,000 mcg by mouth 2 (two) times daily.    Alveda Reasons 15 MG TABS tablet TAKE 1 TABLET BY MOUTH  EVERY EVENING AFTER DINNER 90 tablet 3   No current facility-administered medications for this visit.     Allergies: No Known Allergies      Physical Exam:  Blood pressure (!) 154/46, pulse (!) 55, temperature 98.2 F (36.8 C), temperature source Temporal, resp. rate 20, weight 213 lb 9.6 oz (96.9 kg), SpO2 97 %.    ECOG: 1   General appearance: Comfortable appearing without any discomfort Head: Normocephalic without any trauma Oropharynx: Mucous membranes are moist and pink without any thrush or ulcers. Eyes: Pupils are equal and round reactive to light. Lymph nodes: No cervical, supraclavicular, inguinal or axillary lymphadenopathy.   Heart:regular rate and rhythm.  S1 and S2 without leg edema. Lung: Clear without any rhonchi or wheezes.  No dullness to percussion. Abdomin: Soft, nontender, nondistended with good bowel sounds.  No hepatosplenomegaly. Musculoskeletal: No joint deformity or effusion.  Full range of motion noted. Neurological: No deficits noted on motor, sensory and deep tendon reflex exam. Skin: No petechial rash or dryness.  Appeared moist.       Lab Results: Lab Results  Component Value Date   WBC 5.3 06/13/2019   HGB 9.7 (L) 06/13/2019   HCT 31.1 (L) 06/13/2019   MCV 90.7 06/13/2019   PLT 187 06/13/2019     Chemistry      Component Value Date/Time   NA 138 06/13/2019 1136   K 4.0 06/13/2019 1136   CL 108 06/13/2019 1136   CO2 21 (L) 06/13/2019 1136   BUN 19 06/13/2019 1136   CREATININE 1.10 06/13/2019 1136      Component Value Date/Time   CALCIUM 9.3 06/13/2019 1136   ALKPHOS 47 05/28/2018 1345   AST 18 05/28/2018 1345   ALT 23 05/28/2018 1345   BILITOT 0.4 05/28/2018 1345          Impression and Plan:  79 year old man  with:    1.  Anemia diagnosed in 2019.  Etiology is related to chronic disease, iron deficiency as well as possible radiation exposure.  There is no clear evidence of a myelodysplastic syndrome at this time.   Hemoglobin today appears adequate currently at 11.0 with iron studies in October 2020 showed slight decline with iron of 35 and ferritin of 120.  Risks and benefits of repeat intravenous iron was discussed today.  Potential complications include arthralgias, myalgias and infusion related complications.  Iron studies  are currently pending from today and will repeat intravenous iron if needed.  I recommended resuming oral iron for maintenance purposes and will repeat intravenous iron as needed.  He is agreeable with this plan.  2.  Prostate cancer diagnosed in 2018.  He is status post definitive therapy with radiation and androgen deprivation therapy.  He is currently on active surveillance under the care of Dr. Junious Silk.  3.  Atrial fibrillation: Chronically anticoagulated with Xarelto and follows with Dr. [name].  4.  Age-appropriate cancer screening: Remains up-to-date.  I encouraged him to follow-up with a repeat colonoscopy when he is due.  5.  Follow-up: 4 months for repeat follow-up.  30  minutes was dedicated to this encounter.  The time was spent on reviewing laboratory data, disease status update, treatment options and answering questions regarding future plan of care.   Zola Button, MD 1/28/20218:53 AM

## 2019-08-29 NOTE — Telephone Encounter (Signed)
Patient made aware that per Dr. [name], iron level is normal and to continue once a day oral iron. Patient verbalized understanding.

## 2019-08-29 NOTE — Telephone Encounter (Signed)
Scheduled appt per 1/28 los.  Sent a message to HIM pool to get a calendar mailed out. 

## 2019-08-30 NOTE — Progress Notes (Signed)
[facility] Note  09/03/2019     CHIEF COMPLAINT Patient presents for Retina Evaluation   HISTORY OF PRESENT ILLNESS: Cody Phelps is a 79 y.o. male who presents to the clinic today for:   HPI    Retina Evaluation    In right eye.  This started 9 days ago.  Duration of 9 days.  Associated Symptoms Negative for Flashes, Distortion, Pain, Photophobia, Trauma, Jaw Claudication, Fever, Fatigue, Floaters, Redness, Blind Spot, Glare, Scalp Tenderness, Shoulder/Hip pain and Weight Loss.  Context:  mid-range vision, distance vision, near vision, reading, watching TV, computer work and driving.  Treatments tried include no treatments.  I, the attending physician,  performed the HPI with the patient and updated documentation appropriately.          Comments    Patient states sudden onset of blurry vision OD, starting around 01.23.21. Patient thinks lens broke loose OD. Can't see much out of OD. Vision seems good OS. Using cosopt bid OD, prolensa and pred forte qid OD. Patient is s/p PPV for retinal detachment OD on 11.12.20. BS was 129 last pm. Last a1c was 5.8 pm 06.04.20.        Last edited by Cody Caffey, MD on 09/03/2019 11:21 AM. (History)    pt is with his wife today, pt states about a week ago he could tell that his lens fell into the back of his eye, he states he can see the lens occasionally, pts wife is worried about him being put under anesthesia again, she feels like it is affecting his memory, pt is getting his second covid injection on Monday   Referring physician: [name], MD [place],  [place] [address]  HISTORICAL INFORMATION:   Selected notes from the [facility] Referred by Dr. Martinique Phelps for concern of RD OD   CURRENT MEDICATIONS: Current Outpatient Medications (Ophthalmic Drugs)  Medication Sig  . Bromfenac Sodium (PROLENSA) 0.07 % SOLN Place 1 drop into the right eye 4 (four) times daily.  .  dorzolamide-timolol (COSOPT) 22.3-6.8 MG/ML ophthalmic solution Place 1 drop into the right eye 2 (two) times daily.  . prednisoLONE acetate (PRED FORTE) 1 % ophthalmic suspension Place 1 drop into the right eye 6 (six) times daily. (Patient taking differently: Place 1 drop into the right eye 4 (four) times daily. )   No current facility-administered medications for this visit. (Ophthalmic Drugs)   Current Outpatient Medications (Other)  Medication Sig  . amiodarone (PACERONE) 200 MG tablet TAKE 1 TABLET BY MOUTH  DAILY  . apixaban (ELIQUIS) 5 MG TABS tablet Take 5 mg by mouth 2 (two) times daily.  Marland Kitchen atorvastatin (LIPITOR) 80 MG tablet Take 1 tablet (80 mg total) by mouth daily at 6 PM. (Patient taking differently: Take 80 mg by mouth every evening. )  . colesevelam (WELCHOL) 625 MG tablet Take 625 mg by mouth 3 (three) times daily.   Marland Kitchen donepezil (ARICEPT) 10 MG tablet Take 10 mg by mouth every morning.   Marland Kitchen esomeprazole (NEXIUM) 40 MG capsule Take 40 mg by mouth as needed.   . ferrous sulfate 325 (65 FE) MG tablet Take 325 mg by mouth 3 (three) times daily with meals.  . fluticasone (FLONASE) 50 MCG/ACT nasal spray Place 1 spray into both nostrils 3 times/day as needed-between meals & bedtime for allergies or rhinitis.  Marland Kitchen gabapentin (NEURONTIN) 300 MG capsule Take 300 mg by mouth 3 (three) times daily.  . insulin  glargine (LANTUS SOLOSTAR) 100 UNIT/ML injection Inject 20-40 Units into the skin at bedtime as needed (for BGL >150).   . memantine (NAMENDA) 5 MG tablet Take 5 mg by mouth 2 (two) times daily.   . metoprolol tartrate (LOPRESSOR) 25 MG tablet TAKE 1 TABLET BY MOUTH  DAILY (Patient taking differently: 12.5 mg 2 (two) times a day. )  . mirabegron ER (MYRBETRIQ) 50 MG TB24 tablet Take 50 mg by mouth every evening.   . nabumetone (RELAFEN) 750 MG tablet 2 (two) times daily as needed.   Marland Kitchen omega-3 acid ethyl esters (LOVAZA) 1 G capsule Take 2 g by mouth daily.   Marland Kitchen rOPINIRole (REQUIP) 2 MG  tablet Take 2 mg by mouth 2 (two) times daily as needed (RLS). 1600 & at bedtime  . spironolactone (ALDACTONE) 25 MG tablet TAKE ONE-HALF TABLET BY  MOUTH DAILY (Patient taking differently: Take 12.5 mg by mouth daily. )  . tamsulosin (FLOMAX) 0.4 MG CAPS capsule Take 0.4 mg by mouth daily after supper.   . torsemide (DEMADEX) 20 MG tablet Take 20 mg by mouth 2 (two) times daily.  . valsartan-hydrochlorothiazide (DIOVAN-HCT) 80-12.5 MG tablet TAKE 1 TABLET BY MOUTH IN  THE MORNING  . vitamin B-12 (CYANOCOBALAMIN) 1000 MCG tablet Take 1,000 mcg by mouth 2 (two) times daily.  Alveda Reasons 15 MG TABS tablet TAKE 1 TABLET BY MOUTH  EVERY EVENING AFTER DINNER   No current facility-administered medications for this visit. (Other)      REVIEW OF SYSTEMS: ROS    Positive for: Endocrine, Cardiovascular, Eyes, Respiratory   Negative for: Constitutional, Gastrointestinal, Neurological, Skin, Genitourinary, Musculoskeletal, HENT, Psychiatric, Allergic/Imm, Heme/Lymph   Last edited by Cody Phelps, COT on 09/03/2019 10:05 AM. (History)       ALLERGIES No Known Allergies  PAST MEDICAL HISTORY Past Medical History:  Diagnosis Date  . Acute anterior wall MI ([place]) 03/01/2018  . Acute combined systolic and diastolic heart failure ([place]) 03/15/2018  . Bronchitis, acute 05/04/2012  . Burn   . Carotid stenosis, left followed by dr Cody Phelps, cardiologist--- bilateral bruit per note   per dr Cody Phelps note 79/0383  LICA 33-83% stenosis per duplex 05-14-2014  . Cataracts, bilateral    surgery to remove cataracts  . Chronic diastolic CHF (congestive heart failure) ([place]) 09/21/2018  . Dementia ([place])   . Diverticulosis   . DM (diabetes mellitus) type 2, uncontrolled, with ketoacidosis ([place]) 11/25/2013  . ED (erectile dysfunction)   . First degree heart block   . GERD (gastroesophageal reflux disease)   . Heart murmur   . History of blood transfusion    with open heart surgery  . Hypertension    cardiologsit-  dr  Cody Phelps  . Iron deficiency anemia   . Mixed hyperlipidemia   . Morbid obesity ([place])   . Myocardial infarction ([place])    02/28/18  . OA (osteoarthritis)    right hip  . OSA on CPAP    followed by dr dohmeier, uses CPAP  . PAF (paroxysmal atrial fibrillation) ([name])    a. on Eliquis  . Prostate cancer ([place])    dx 03-17-2017 (bx)  Stage T2a, Gleason 4+4, PSA  4.43, vol 32.32--  s/p radiatctive prostate seed implants 07-10-2017 then IMRT and ADT  . RLS (restless legs syndrome)   . S/P aortic valve replacement with bioprosthetic valve 05/30/2018   23 mm Edwards Inspiris Resilia stented bovine pericardial tissue valve  . 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 S/P Maze operation for atrial fibrillation 05/30/2018   Complete bilateral atrial lesion set using bipolar radiofrequency and cryothermy ablation with clipping of LA appendage  . Scoliosis   . Severe aortic stenosis   . Trigger finger   . Type 2 diabetes mellitus ([place])   . Wears partial dentures    upper and lower   Past Surgical History:  Procedure Laterality Date  . AORTIC VALVE REPLACEMENT N/A 05/30/2018   Procedure: AORTIC VALVE REPLACEMENT (AVR) using Inspiris Valve, Size 23;  Surgeon: [name], MD;  Location: [facility];  Service: Open Heart Surgery;  Laterality: N/A;  . APPENDECTOMY  1988  . BILATERAL TOTAL ETHMOIDECTOMY AND SPHENOIDECTOMY  01-14-2009    dr bates   [facility]  . CARDIOVERSION N/A 08/10/2018   Procedure: CARDIOVERSION;  Surgeon: Adrian Prows, MD;  Location: [facility];  Service: Cardiovascular;  Laterality: N/A;  . CARPAL TUNNEL RELEASE Bilateral 2013  . CATARACT EXTRACTION W/ INTRAOCULAR LENS  IMPLANT, BILATERAL  date?  . CORONARY ARTERY BYPASS GRAFT N/A 05/30/2018   Procedure: CORONARY ARTERY BYPASS GRAFTING (CABG) x one using left internal mammary artery;  Surgeon: [name], MD;  Location: [facility];  Service: Open Heart Surgery;  Laterality: N/A;  . CORONARY BALLOON ANGIOPLASTY N/A 03/01/2018    Procedure: CORONARY BALLOON ANGIOPLASTY;  Surgeon: Adrian Prows, MD;  Location: [facility];  Service: Cardiovascular;  Laterality: N/A;  . CORONARY/GRAFT ACUTE MI REVASCULARIZATION N/A 03/01/2018   Procedure: Coronary/Graft Acute MI Revascularization;  Surgeon: Adrian Prows, MD;  Location: [facility];  Service: Cardiovascular;  Laterality: N/A;  . CYSTOSCOPY  07/19/2017   Procedure: CYSTOSCOPY;  Surgeon: Nickie Retort, MD;  Location: [facility];  Service: Urology;;  No seeds found in [place]  . GAS/FLUID EXCHANGE Right 06/13/2019   Procedure: GAS/FLUID EXCHANGE;  Surgeon: Cody Caffey, MD;  Location: [facility];  Service: Ophthalmology;  Laterality: Right;  . LAMINECTOMY WITH POSTERIOR LATERAL ARTHRODESIS LEVEL 2 N/A 02/12/2014   Procedure: LUMBAR TWO-THREE,LUIMBAR THREE-FOUR LAMINECTOMY/FORAMINOTOMY;POSSIBLE POSTEROLATERAL ARTHRODESIS WITH AUTOGRAFT;  Surgeon: [name], MD;  Location: [facility];  Service: Neurosurgery;  Laterality: N/A;  . LEFT HEART CATH AND CORONARY ANGIOGRAPHY N/A 03/01/2018   Procedure: LEFT HEART CATH AND CORONARY ANGIOGRAPHY;  Surgeon: Adrian Prows, MD;  Location: [facility];  Service: Cardiovascular;  Laterality: N/A;  . MAZE N/A 05/30/2018   Procedure: MAZE;  Surgeon: [name], MD;  Location: [facility];  Service: Open Heart Surgery;  Laterality: N/A;  . ORIF RIGHT ANKLE FX'S  05/05/2001   retained hardware  . PARS PLANA VITRECTOMY Right 06/13/2019   Procedure: PARS PLANA VITRECTOMY WITH 25 GAUGE WITH LASER AND GAS;  Surgeon: Cody Caffey, MD;  Location: [facility];  Service: Ophthalmology;  Laterality: Right;  . [place] INJECTION Right 06/13/2019   Procedure: PERFLUORONE INJECTION;  Surgeon: Cody Caffey, MD;  Location: [facility];  Service: Ophthalmology;  Laterality: Right;  . PHOTOCOAGULATION WITH LASER Right 06/13/2019   Procedure: PHOTOCOAGULATION WITH LASER;  Surgeon: Cody Caffey, MD;  Location: [facility];  Service:  Ophthalmology;  Laterality: Right;  . PROSTATE BIOPSY  03-17-2017   dr Pilar Jarvis office  . RADIOACTIVE SEED IMPLANT N/A 07/19/2017   Procedure: RADIOACTIVE SEED IMPLANT/BRACHYTHERAPY IMPLANT;  Surgeon: Nickie Retort, MD;  Location: [facility];  Service: Urology;  Laterality: N/A;  69 seeds implanted  . RETINAL DETACHMENT SURGERY Right 06/13/2019   Dr. Coralyn Pear  . SKIN GRAFT  face  . SPACE OAR INSTILLATION N/A 07/19/2017   Procedure: SPACE OAR INSTILLATION;  Surgeon: Nickie Retort, MD;  Location: [facility];  Service: Urology;  Laterality: N/A;  . TEE WITHOUT CARDIOVERSION  03/20/2012   Procedure: TRANSESOPHAGEAL ECHOCARDIOGRAM (TEE);  Surgeon: Laverda Page, MD;  Location: [facility];  Service: Cardiovascular;  Laterality: N/A;  normal LV; normal EF; normal RV; normal LA w/ left atrial appendage very small, normal function, interatrial septum intact without defect; normal RA; trace MR,TR, & PI; mild AV calcification and senile degeneration w/ mild stenosis, AVA 1.7cm^2;;   . TEE WITHOUT CARDIOVERSION N/A 05/30/2018   Procedure: TRANSESOPHAGEAL ECHOCARDIOGRAM (TEE);  Surgeon: [name], MD;  Location: [facility];  Service: Open Heart Surgery;  Laterality: N/A;  . TOTAL HIP ARTHROPLASTY Right 10/23/2017   Procedure: TOTAL HIP ARTHROPLASTY ANTERIOR APPROACH;  Surgeon: Frederik Pear, MD;  Location: [facility];  Service: Orthopedics;  Laterality: Right;  . TRANSTHORACIC ECHOCARDIOGRAM  01-11-2017   dr Cody Phelps  (per echo note, no significant change in seveity of AS, no other diagnostic change)   moderate concentric LVH, ef 37%, grade 1 diastolic dysfunction/  moderate LAE/  mild , grade 1 AR w/ moderate AV calcification, mild to moderate restricted AV leaflets w/ moderate AS, AVA 1.16cm^2, peak grandient 592mHg, mean grandient 347mg/  trace MR, mild calcification MV annulus , mild MV leaflet calcification, mild MVS, peak grandient 4.92m22m, mean grandient 2.7mm6m  trace TR    FAMILY HISTORY Family History  Problem Relation Age of Onset  . Stroke Mother   . Hypertension Mother   . Heart attack Father   . Heart murmur Father   . Hypertension Sister   . Colon cancer Neg Hx   . Stomach cancer Neg Hx   . Rectal cancer Neg Hx   . Pancreatic cancer Neg Hx     SOCIAL HISTORY Social History   Tobacco Use  . Smoking status: Former Smoker    Years: 1.00    Types: Cigarettes    Quit date: 02/04/1967    Years since quitting: 52.6  . Smokeless tobacco: Never Used  . Tobacco comment: smoked 1 q2-3 days  Substance Use Topics  . Alcohol use: Yes    Comment: occasional beer  . Drug use: No         OPHTHALMIC EXAM:  Base Eye Exam    Visual Acuity (Snellen - Linear)      Right Left   Dist [place] CF at 3' 20/25   Dist ph [place] 20/150 20/20 -2       Tonometry (Tonopen, 10:19 AM)      Right Left   Pressure 19 14       Pupils      Dark Light Shape React APD   Right 5 5 Round None None   Left 3 2.5 Round Minimal None       Visual Fields (Counting fingers)      Left Right    Full Full       Extraocular Movement      Right Left    Full, Ortho Full, Ortho       Neuro/Psych    Oriented x3: Yes   Mood/Affect: Normal       Dilation    Both eyes: 1.0% Mydriacyl, 2.5% Phenylephrine @ 10:19 AM        Slit Lamp and Fundus Exam    Slit Lamp Exam      Right Left  Lids/Lashes mild Meibomian gland dysfunction Dermatochalasis - upper lid, Meibomian gland dysfunction   Conjunctiva/Sclera temporal Pinguecula, sutures dissolving Temporal Pinguecula   Cornea 1+ inferior Punctate epithelial erosions, Debris in tear film 1+ Punctate epithelial erosions, +verticellata, trace endo pigment, Debris in tear film   Anterior Chamber Deep and quiet Deep and quiet   Iris Round and poorly dilated to 9m, scattered Transillumination defects 360 Round and dilated, scattered, 360 Transillumination defects   Lens Aphakia, capsular remnant with +pigment remain  in pupil 3 piece Posterior chamber intraocular in good position, trace Posterior capsular opacification   Vitreous post vitrectomy, gas bubble gone, trace pigment, dislocated PC IOL settled inferiorly Vitreous syneresis, Posterior vitreous detachment       Fundus Exam      Right Left   Disc 1+Pallor, Sharp rim Mild Pallor, Sharp rim, temporal PPP, mild tilt   C/Phelps Ratio 0.3 0.4   Macula Flat, mild ERM superiorly, Blunted foveal reflex, RPE mottling and clumping Flat, Blunted foveal reflex, Retinal pigment epithelial mottling and clumping, No heme or edema   Vessels Vascular attenuation, AV crossing changes, Tortuous Vascular attenuation, Tortuous   Periphery attached, good 360 laser in place; ORIGINALLY: bullous superior retinal detachment from 1000-0130, another more shallow detachment lobe from 130-400, lattice and micro tears ST quadrant; Old retinal tear at 0900 with surrounding laser, Attached, peripheral laser scars at 1030         Refraction    Manifest Refraction      Sphere Cylinder Dist VA   Right +9.75 Sphere 20/25-1   Left             IMAGING AND PROCEDURES  Imaging and Procedures for _0 @  OCT, Retina - OU - Both Eyes       Right Eye Quality was poor. Central Foveal Thickness: 253. Progression has improved. Findings include intraretinal fluid, normal foveal contour, no SRF (Retina re-attached; interval improvement in IRF).   Left Eye Quality was good. Central Foveal Thickness: 263. Progression has been stable. Findings include normal foveal contour, no IRF, no SRF.   Notes *Images captured and stored on drive  Diagnosis / Impression:  OD: Retina re-attached; interval improvement in IRF OS: NFP, no IRF/SRF   Clinical management:  See below  Abbreviations: NFP - Normal foveal profile. CME - cystoid macular edema. PED - pigment epithelial detachment. IRF - intraretinal fluid. SRF - subretinal fluid. EZ - ellipsoid zone. ERM - epiretinal membrane. ORA - outer  retinal atrophy. ORT - outer retinal tubulation. SRHM - subretinal hyper-reflective material        IOL Biometry - OU - Both Eyes       **Measurements/calculations stored on drive**                  ASSESSMENT/PLAN:    ICD-10-CM   1. Dislocation of intraocular lens, sequela  T85.22XS IOL Biometry - OU - Both Eyes  2. Aphakia of right eye  H27.01 IOL Biometry - OU - Both Eyes  3. Right retinal detachment  H33.21   4. 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 Retinal edema  H35.81 OCT, Retina - OU - Both Eyes  6. History of repair of retinal tear by laser photocoagulation  Z98.890   7. Diabetes mellitus type 2 without retinopathy ([place]  E11.9   8. Essential hypertension  I10   9. Hypertensive retinopathy of both eyes  H35.033   10. Pseudophakia of both eyes  Z96.1    1,2.  Dislocated IOL / now aphakic OD  - pt with history of partially dislocated 3-piece IOL -- spontaneously dislocated completely into vitreous cavity on 1.23.21  - IOL master calcs obtained today  - recommend 25g PPV w/ IOL explantation and secondary sutured IOL OD -- Akreos AO60 lens, 17.5D -- under general anesthesia  - RBA of procedure discussed, questions answered  - informed consent obtained and signed  - case scheduled for [place] [facility] on 09/12/19, 1130 am  - pt will need medical clearance and pre-op COVID test  - will have pt hold Eliquis 48 hrs prior to surgery    3-5. Rhegmatogenous retinal detachment, right eye  - bullous, superior, mac off detachment, onset of foveal involvement 11.11.20 by history  - Bi-lobed superior detachment, superior lobe spanning 1000-0130, nasal lobe 0130-0400  - lattice degeneration with microtears noted at 1030  - s/p PPV/PFC/EL/FAX/14% C3F8 OD, 11.12.20             - intraop: HST at 2 oclock was found; also detachment had progressed to subtotal detachment spanning 9 oclock to 6 oclock (going in clockwise direction); also severe zonular insufficiency with IOL  very mobile throughout case -- did not dislocate completely during the case or immediately post op  - doing well              - retina attached and in good position -- good laser in place  - central cystic changes slightly improved on exam and OCT today  - IOL displaced and VA down to 20/150 (PH)             - IOP 19             - cont   PF QID                         Cosopt BID OD   Prolensa QID OD as above  6. History of retinal defects s/p laser retinopexy OU with Dr. Zigmund Daniel in 2013  - OD laser at 0900  - OS laser at 1030  - stable  7. Diabetes mellitus, type 2 without retinopathy  - The incidence, risk factors for progression, natural history and treatment options for diabetic retinopathy  were discussed with patient.    - The need for close monitoring of blood glucose, blood pressure, and serum lipids, avoiding cigarette or any type of tobacco, and the need for long term follow up was also discussed with patient  8,9. Hypertensive retinopathy OU  - discussed importance of tight BP control  - monitor  10. Pseudophakia OU  - s/p CE/IOL OU (Dr. [name])  - 3 piece IOL OD no completely displaced into vitreous  - OS 3-piece IOL in good position  - monitor   Ophthalmic Meds Ordered this visit:  No orders of the defined types were placed in this encounter.      Return for f/u POV February 12.  There are no Patient Instructions on file for this visit.   Explained the diagnoses, plan, and follow up with the patient and they expressed understanding.  Patient expressed understanding of the importance of proper follow up care.  This document serves as a record of services personally performed by Gardiner Sleeper, MD, PhD. It was created on their behalf by Estill Bakes, COT an ophthalmic technician. The creation of this record is the provider's dictation and/or activities during the visit.    Electronically signed by: Estill Bakes, COT  08/30/19 @ 11:25 PM   This document  serves as a record of services personally performed by Gardiner Sleeper, MD, PhD. It was created on their behalf by Ernest Mallick, OA, an ophthalmic assistant. The creation of this record is the provider's dictation and/or activities during the visit.    Electronically signed by: Ernest Mallick, OA 02.02.2021 11:25 PM   Gardiner Sleeper, M.Phelps., Ph.Phelps. Diseases & Surgery of the Retina and [place] 09/03/2019   I have reviewed the above documentation for accuracy and completeness, and I agree with the above. Gardiner Sleeper, M.Phelps., Ph.Phelps. 09/04/19 11:25 PM    Abbreviations: M myopia (nearsighted); A astigmatism; H hyperopia (farsighted); P presbyopia; Mrx spectacle prescription;  CTL contact lenses; OD right eye; OS left eye; OU both eyes  XT exotropia; ET esotropia; PEK punctate epithelial keratitis; PEE punctate epithelial erosions; DES dry eye syndrome; MGD meibomian gland dysfunction; ATs artificial tears; PFAT's preservative free artificial tears; [place] nuclear sclerotic cataract; PSC posterior subcapsular cataract; ERM epi-retinal membrane; PVD posterior vitreous detachment; RD retinal detachment; DM diabetes mellitus; DR diabetic retinopathy; NPDR non-proliferative diabetic retinopathy; PDR proliferative diabetic retinopathy; CSME clinically significant macular edema; DME diabetic macular edema; dbh dot blot hemorrhages; CWS cotton wool spot; POAG primary open angle glaucoma; C/Phelps cup-to-disc ratio; HVF humphrey visual field; GVF goldmann visual field; OCT optical coherence tomography; IOP intraocular pressure; BRVO Branch retinal vein occlusion; CRVO central retinal vein occlusion; CRAO central retinal artery occlusion; BRAO branch retinal artery occlusion; RT retinal tear; SB scleral buckle; PPV pars plana vitrectomy; VH Vitreous hemorrhage; PRP panretinal laser photocoagulation; IVK intravitreal kenalog; VMT vitreomacular traction; MH Macular hole;  NVD  neovascularization of the disc; NVE neovascularization elsewhere; AREDS age related eye disease study; ARMD age related macular degeneration; POAG primary open angle glaucoma; EBMD epithelial/anterior basement membrane dystrophy; ACIOL anterior chamber intraocular lens; IOL intraocular lens; PCIOL posterior chamber intraocular lens; Phaco/IOL phacoemulsification with intraocular lens placement; [place] photorefractive keratectomy; LASIK laser assisted in situ keratomileusis; HTN hypertension; DM diabetes mellitus; COPD chronic obstructive pulmonary disease

## 2019-09-03 ENCOUNTER — Encounter (INDEPENDENT_AMBULATORY_CARE_PROVIDER_SITE_OTHER): Payer: Self-pay | Admitting: Ophthalmology

## 2019-09-03 ENCOUNTER — Other Ambulatory Visit: Payer: Self-pay

## 2019-09-03 ENCOUNTER — Ambulatory Visit (INDEPENDENT_AMBULATORY_CARE_PROVIDER_SITE_OTHER): Payer: Medicare Other | Admitting: Ophthalmology

## 2019-09-03 DIAGNOSIS — H35033 Hypertensive retinopathy, bilateral: Secondary | ICD-10-CM

## 2019-09-03 DIAGNOSIS — H35411 Lattice degeneration of retina, right eye: Secondary | ICD-10-CM | POA: Diagnosis not present

## 2019-09-03 DIAGNOSIS — Z9889 Other specified postprocedural states: Secondary | ICD-10-CM

## 2019-09-03 DIAGNOSIS — H3581 Retinal edema: Secondary | ICD-10-CM

## 2019-09-03 DIAGNOSIS — H3321 Serous retinal detachment, right eye: Secondary | ICD-10-CM

## 2019-09-03 DIAGNOSIS — I10 Essential (primary) hypertension: Secondary | ICD-10-CM

## 2019-09-03 DIAGNOSIS — H2701 Aphakia, right eye: Secondary | ICD-10-CM

## 2019-09-03 DIAGNOSIS — E119 Type 2 diabetes mellitus without complications: Secondary | ICD-10-CM

## 2019-09-03 DIAGNOSIS — Z961 Presence of intraocular lens: Secondary | ICD-10-CM

## 2019-09-03 DIAGNOSIS — T8522XS Displacement of intraocular lens, sequela: Secondary | ICD-10-CM

## 2019-09-04 ENCOUNTER — Encounter (INDEPENDENT_AMBULATORY_CARE_PROVIDER_SITE_OTHER): Payer: Self-pay | Admitting: Ophthalmology

## 2019-09-04 ENCOUNTER — Encounter: Payer: Self-pay | Admitting: Cardiology

## 2019-09-06 ENCOUNTER — Telehealth: Payer: Self-pay | Admitting: Oncology

## 2019-09-06 NOTE — Telephone Encounter (Signed)
Returned patient's phone call regarding rescheduling May appointments, patient will be out of town and will reschedule for 06/02.

## 2019-09-09 ENCOUNTER — Ambulatory Visit: Payer: Medicare Other | Attending: Internal Medicine

## 2019-09-09 ENCOUNTER — Other Ambulatory Visit (HOSPITAL_COMMUNITY)
Admission: RE | Admit: 2019-09-09 | Discharge: 2019-09-09 | Disposition: A | Discharge status: Home / Self Care | Payer: Medicare Other | Source: Ambulatory Visit | Attending: Ophthalmology | Admitting: Ophthalmology

## 2019-09-09 DIAGNOSIS — Z20822 Contact with and (suspected) exposure to covid-19: Secondary | ICD-10-CM | POA: Diagnosis not present

## 2019-09-09 DIAGNOSIS — Z23 Encounter for immunization: Secondary | ICD-10-CM | POA: Insufficient documentation

## 2019-09-09 DIAGNOSIS — Z01812 Encounter for preprocedural laboratory examination: Secondary | ICD-10-CM | POA: Insufficient documentation

## 2019-09-09 LAB — SARS CORONAVIRUS 2 (TAT 6-24 HRS): SARS Coronavirus 2: NEGATIVE

## 2019-09-09 NOTE — Progress Notes (Addendum)
[facility] Note  09/13/2019     CHIEF COMPLAINT Patient presents for Retina Follow Up   HISTORY OF PRESENT ILLNESS: Cody Phelps is a 79 y.o. male who presents to the clinic today for:   HPI    Retina Follow Up    Patient presents with  Other.  In right eye.  This started 1 day ago.  Severity is moderate.  I, the attending physician,  performed the HPI with the patient and updated documentation appropriately.          Comments    Patient here for PO dislocated IOL OD. Patient came in with patch over OD. I took patch off. Patient has no eye pain.        Last edited by Cody Caffey, MD on 09/13/2019  9:17 AM. (History)    pt is with his wife today, he states he is doing well today. No pain overnight  Referring physician: Demarco, Phel[place], [place] [place],  [place] [address]  HISTORICAL INFORMATION:   Selected notes from the MEDICAL RECORD NUMBER Referred by Dr. [name] for concern of RD OD   CURRENT MEDICATIONS: Current Outpatient Medications (Ophthalmic Drugs)  Medication Sig  . Bromfenac Sodium (PROLENSA) 0.07 % SOLN Place 1 drop into the right eye 4 (four) times daily.  . dorzolamide-timolol (COSOPT) 22.3-6.8 MG/ML ophthalmic solution Place 1 drop into the right eye 2 (two) times daily.  . prednisoLONE acetate (PRED FORTE) 1 % ophthalmic suspension Place 1 drop into the right eye 6 (six) times daily. (Patient taking differently: Place 1 drop into the right eye 4 (four) times daily. )   No current facility-administered medications for this visit. (Ophthalmic Drugs)   Current Outpatient Medications (Other)  Medication Sig  . amiodarone (PACERONE) 200 MG tablet TAKE 1 TABLET BY MOUTH  DAILY (Patient taking differently: Take 200 mg by mouth daily. )  . atorvastatin (LIPITOR) 80 MG tablet Take 1 tablet (80 mg total) by mouth daily at 6 PM. (Patient taking differently: Take 40 mg by mouth every evening. )  . colesevelam  (WELCHOL) 625 MG tablet Take 625 mg by mouth 3 (three) times daily.   Marland Kitchen donepezil (ARICEPT) 10 MG tablet Take 10 mg by mouth every morning.   Marland Kitchen esomeprazole (NEXIUM) 40 MG capsule Take 40 mg by mouth daily.   . ferrous sulfate 325 (65 FE) MG tablet Take 325 mg by mouth 3 (three) times daily with meals.  . gabapentin (NEURONTIN) 300 MG capsule Take 300 mg by mouth 2 (two) times daily.   . insulin glargine (LANTUS SOLOSTAR) 100 UNIT/ML injection Inject 20-40 Units into the skin at bedtime as needed (BGL below 140-150 take 20 units, if 151 or higher take 40 units).   . memantine (NAMENDA) 5 MG tablet Take 5 mg by mouth 2 (two) times daily.   . metoprolol tartrate (LOPRESSOR) 25 MG tablet TAKE 1 TABLET BY MOUTH  DAILY (Patient taking differently: Take 12.5 mg by mouth 2 (two) times a day. )  . mirabegron ER (MYRBETRIQ) 50 MG TB24 tablet Take 50 mg by mouth at bedtime.   Marland Kitchen omega-3 acid ethyl esters (LOVAZA) 1 G capsule Take 2 g by mouth daily.   Marland Kitchen rOPINIRole (REQUIP) 2 MG tablet Take 2 mg by mouth 3 (three) times daily as needed (RLS).   Marland Kitchen spironolactone (ALDACTONE) 25 MG tablet TAKE ONE-HALF TABLET BY  MOUTH DAILY (Patient taking differently: Take 12.5 mg by mouth daily. )  .  tamsulosin (FLOMAX) 0.4 MG CAPS capsule Take 0.4 mg by mouth daily after supper.   . valsartan-hydrochlorothiazide (DIOVAN-HCT) 80-12.5 MG tablet TAKE 1 TABLET BY MOUTH IN  THE MORNING (Patient taking differently: Take 1 tablet by mouth every morning. )  . vitamin B-12 (CYANOCOBALAMIN) 1000 MCG tablet Take 1,000 mcg by mouth 2 (two) times daily.  Alveda Reasons 15 MG TABS tablet TAKE 1 TABLET BY MOUTH  EVERY EVENING AFTER DINNER (Patient taking differently: Take 15 mg by mouth daily with supper. )   No current facility-administered medications for this visit. (Other)      REVIEW OF SYSTEMS: ROS    Positive for: Endocrine, Cardiovascular, Eyes, Respiratory   Negative for: Constitutional, Gastrointestinal, Neurological, Skin,  Genitourinary, Musculoskeletal, HENT, Psychiatric, Allergic/Imm, Heme/Lymph   Last edited by Cody Phelps, COA on 09/13/2019  8:41 AM. (History)       ALLERGIES No Known Allergies  PAST MEDICAL HISTORY Past Medical History:  Diagnosis Date  . Acute anterior wall MI ([place]) 03/01/2018  . Acute combined systolic and diastolic heart failure ([place]) 03/15/2018  . Bronchitis, acute 05/04/2012  . Burn   . Carotid stenosis, left followed by dr Cody Phelps, cardiologist--- bilateral bruit per note   per dr Cody Phelps note 74/1423  LICA 95-32% stenosis per duplex 05-14-2014  . Cataracts, bilateral    surgery to remove cataracts  . Chronic diastolic CHF (congestive heart failure) (Cody Phelps) 09/21/2018  . Dementia (Cody Phelps)   . Diverticulosis   . DM (diabetes mellitus) type 2, uncontrolled, with ketoacidosis (Cody Phelps) 11/25/2013  . ED (erectile dysfunction)   . First degree heart block   . GERD (gastroesophageal reflux disease)   . Heart murmur   . History of blood transfusion    with open heart surgery  . Hypertension    cardiologsit-  dr Cody Phelps  . Iron deficiency anemia   . Mixed hyperlipidemia   . Morbid obesity (Cody Phelps)   . Myocardial infarction (Cody Phelps)    02/28/18  . OA (osteoarthritis)    right hip  . OSA on CPAP    followed by dr Cody Phelps, uses CPAP  . PAF (paroxysmal atrial fibrillation) ([name])    a. on Eliquis  . Prostate cancer (Cody Phelps)    dx 03-17-2017 (bx)  Stage T2a, Gleason 4+4, PSA  4.43, vol 32.32--  s/p radiatctive prostate seed implants 07-10-2017 then IMRT and ADT  . RLS (restless legs syndrome)   . S/P aortic valve replacement with bioprosthetic valve 05/30/2018   23 mm Edwards Inspiris Resilia stented bovine pericardial tissue valve  . 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 S/P Maze operation for atrial fibrillation 05/30/2018   Complete bilateral atrial lesion set using bipolar radiofrequency and cryothermy ablation with clipping of LA appendage  . Scoliosis   . Severe aortic  stenosis   . Trigger finger   . Type 2 diabetes mellitus ([place])   . Wears partial dentures    upper and lower   Past Surgical History:  Procedure Laterality Date  . AORTIC VALVE REPLACEMENT N/A 05/30/2018   Procedure: AORTIC VALVE REPLACEMENT (AVR) using Inspiris Valve, Size 23;  Surgeon: [name], MD;  Location: [place];  Service: Open Heart Surgery;  Laterality: N/A;  . APPENDECTOMY  1988  . BILATERAL TOTAL ETHMOIDECTOMY AND SPHENOIDECTOMY  01-14-2009    dr bates   [facility]  . CARDIOVERSION N/A 08/10/2018   Procedure: CARDIOVERSION;  Surgeon: Adrian Prows, MD;  Location: [place];  Service: Cardiovascular;  Laterality: N/A;  . CARPAL TUNNEL RELEASE Bilateral 2013  . CATARACT EXTRACTION W/ INTRAOCULAR LENS  IMPLANT, BILATERAL  date?  . CORONARY ARTERY BYPASS GRAFT N/A 05/30/2018   Procedure: CORONARY ARTERY BYPASS GRAFTING (CABG) x one using left internal mammary artery;  Surgeon: [name], MD;  Location: [place];  Service: Open Heart Surgery;  Laterality: N/A;  . CORONARY BALLOON ANGIOPLASTY N/A 03/01/2018   Procedure: CORONARY BALLOON ANGIOPLASTY;  Surgeon: Adrian Prows, MD;  Location: [place] [facility];  Service: Cardiovascular;  Laterality: N/A;  . CORONARY/GRAFT ACUTE MI REVASCULARIZATION N/A 03/01/2018   Procedure: Coronary/Graft Acute MI Revascularization;  Surgeon: Adrian Prows, MD;  Location: [place] [facility];  Service: Cardiovascular;  Laterality: N/A;  . CYSTOSCOPY  07/19/2017   Procedure: CYSTOSCOPY;  Surgeon: Nickie Retort, MD;  Location: [facility];  Service: Urology;;  No seeds found in [place]  . GAS/FLUID EXCHANGE Right 06/13/2019   Procedure: GAS/FLUID EXCHANGE;  Surgeon: Cody Caffey, MD;  Location: [place];  Service: Ophthalmology;  Laterality: Right;  . LAMINECTOMY WITH POSTERIOR LATERAL ARTHRODESIS LEVEL 2 N/A 02/12/2014   Procedure: LUMBAR TWO-THREE,LUIMBAR THREE-FOUR LAMINECTOMY/FORAMINOTOMY;POSSIBLE POSTEROLATERAL ARTHRODESIS WITH AUTOGRAFT;   Surgeon: [name], MD;  Location: [facility];  Service: Neurosurgery;  Laterality: N/A;  . LEFT HEART CATH AND CORONARY ANGIOGRAPHY N/A 03/01/2018   Procedure: LEFT HEART CATH AND CORONARY ANGIOGRAPHY;  Surgeon: Adrian Prows, MD;  Location: [place] [facility];  Service: Cardiovascular;  Laterality: N/A;  . MAZE N/A 05/30/2018   Procedure: MAZE;  Surgeon: [name], MD;  Location: [place];  Service: Open Heart Surgery;  Laterality: N/A;  . ORIF RIGHT ANKLE FX'S  05/05/2001   retained hardware  . PARS PLANA VITRECTOMY Right 06/13/2019   Procedure: PARS PLANA VITRECTOMY WITH 25 GAUGE WITH LASER AND GAS;  Surgeon: Cody Caffey, MD;  Location: [place];  Service: Ophthalmology;  Laterality: Right;  . [place] INJECTION Right 06/13/2019   Procedure: PERFLUORONE INJECTION;  Surgeon: Cody Caffey, MD;  Location: [place];  Service: Ophthalmology;  Laterality: Right;  . PHOTOCOAGULATION WITH LASER Right 06/13/2019   Procedure: PHOTOCOAGULATION WITH LASER;  Surgeon: Cody Caffey, MD;  Location: [place];  Service: Ophthalmology;  Laterality: Right;  . PROSTATE BIOPSY  03-17-2017   dr Pilar Jarvis office  . RADIOACTIVE SEED IMPLANT N/A 07/19/2017   Procedure: RADIOACTIVE SEED IMPLANT/BRACHYTHERAPY IMPLANT;  Surgeon: Nickie Retort, MD;  Location: [facility];  Service: Urology;  Laterality: N/A;  69 seeds implanted  . RETINAL DETACHMENT SURGERY Right 06/13/2019   Dr. Coralyn Pear  . SKIN GRAFT     face  . SPACE OAR INSTILLATION N/A 07/19/2017   Procedure: SPACE OAR INSTILLATION;  Surgeon: Nickie Retort, MD;  Location: [facility];  Service: Urology;  Laterality: N/A;  . TEE WITHOUT CARDIOVERSION  03/20/2012   Procedure: TRANSESOPHAGEAL ECHOCARDIOGRAM (TEE);  Surgeon: Laverda Page, MD;  Location: [facility];  Service: Cardiovascular;  Laterality: N/A;  normal LV; normal EF; normal RV; normal LA w/ left atrial appendage very small, normal function, interatrial  septum intact without defect; normal RA; trace MR,TR, & PI; mild AV calcification and senile degeneration w/ mild stenosis, AVA 1.7cm^2;;   . TEE WITHOUT CARDIOVERSION N/A 05/30/2018   Procedure: TRANSESOPHAGEAL ECHOCARDIOGRAM (TEE);  Surgeon: [name], MD;  Location: [place];  Service: Open Heart Surgery;  Laterality: N/A;  . TOTAL HIP ARTHROPLASTY Right 10/23/2017   Procedure: TOTAL HIP ARTHROPLASTY ANTERIOR APPROACH;  Surgeon:  Frederik Pear, MD;  Location: [place];  Service: Orthopedics;  Laterality: Right;  . TRANSTHORACIC ECHOCARDIOGRAM  01-11-2017   dr Cody Phelps  (per echo note, no significant change in seveity of AS, no other diagnostic change)   moderate concentric LVH, ef 78%, grade 1 diastolic dysfunction/  moderate LAE/  mild , grade 1 AR w/ moderate AV calcification, mild to moderate restricted AV leaflets w/ moderate AS, AVA 1.16cm^2, peak grandient 28mHg, mean grandient 334mg/  trace MR, mild calcification MV annulus , mild MV leaflet calcification, mild MVS, peak grandient 4.48m51m, mean grandient 2.7mm87m trace TR    FAMILY HISTORY Family History  Problem Relation Age of Onset  . Stroke Mother   . Hypertension Mother   . Heart attack Father   . Heart murmur Father   . Hypertension Sister   . Colon cancer Neg Hx   . Stomach cancer Neg Hx   . Rectal cancer Neg Hx   . Pancreatic cancer Neg Hx     SOCIAL HISTORY Social History   Tobacco Use  . Smoking status: Former Smoker    Years: 1.00    Types: Cigarettes    Quit date: 02/04/1967    Years since quitting: 52.6  . Smokeless tobacco: Never Used  . Tobacco comment: smoked 1 q2-3 days  Substance Use Topics  . Alcohol use: Yes    Comment: occasional beer  . Drug use: No         OPHTHALMIC EXAM:  Base Eye Exam    Visual Acuity (Snellen - Linear)      Right Left   Dist [place] 20/80 -2 20/25 -1   Dist ph [place] 20/70 -2 20/25 +1       Tonometry (Tonopen, 8:36 AM)      Right Left   Pressure 18 14       Pupils       Dark Light Shape React APD   Right 5 5 Round none None   Left 3 2.5 Round Minimal None       Neuro/Psych    Oriented x3: Yes   Mood/Affect: Normal       Dilation    Right eye: 1.0% Mydriacyl, 2.5% Phenylephrine @ 8:36 AM        Slit Lamp and Fundus Exam    External Exam      Right Left   External Periorbital edema        Slit Lamp Exam      Right Left   Lids/Lashes mild Meibomian gland dysfunction Dermatochalasis - upper lid, Meibomian gland dysfunction   Conjunctiva/Sclera Subconjunctival hemorrhage, sutures intact Temporal Pinguecula   Cornea Epi defect, 2+ Descemet's folds, 3 nylon sutures at 12, closed corneal wound 1+ Punctate epithelial erosions, +verticellata, trace endo pigment, Debris in tear film   Anterior Chamber Deep, 0.5+cell Deep and quiet   Iris Round and poorly dilated to 6mm,63mattered Transillumination defects 360, +iridodonesis  Round and dilated, scattered, 360 Transillumination defects   Lens Sutured Akreos IOL well centered  3 piece Posterior chamber intraocular in good position, trace Posterior capsular opacification   Vitreous post vitrectomy, trace pigment Vitreous syneresis, Posterior vitreous detachment       Fundus Exam      Right Left   Disc 1+Pallor, Sharp rim Mild Pallor, Sharp rim, temporal PPP, mild tilt   C/D Ratio 0.3 0.4   Macula Flat, mild ERM superiorly, Blunted foveal reflex, RPE mottling and clumping Flat, Blunted foveal reflex, Retinal pigment epithelial mottling and  clumping, No heme or edema   Vessels Vascular attenuation, AV crossing changes, Tortuous Vascular attenuation, Tortuous   Periphery attached, good 360 laser in place; ORIGINALLY: bullous superior retinal detachment from 1000-0130, another more shallow detachment lobe from 130-400, lattice and micro tears ST quadrant; Old retinal tear at 0900 with surrounding laser, Attached, peripheral laser scars at 1030           IMAGING AND PROCEDURES  Imaging and Procedures for  _0 @           ASSESSMENT/PLAN:    ICD-10-CM   1. Dislocation of intraocular lens, sequela  T85.22XS   2. Aphakia of right eye  H27.01   3. Right retinal detachment  H33.21   4. 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 Retinal edema  H35.81   6. History of repair of retinal tear by laser photocoagulation  Z98.890   7. Diabetes mellitus type 2 without retinopathy ([place])  E11.9   8. Essential hypertension  I10   9. Hypertensive retinopathy of both eyes  H35.033   10. Pseudophakia of both eyes  Z96.1    1,2. Dislocated IOL OD  - pt with history of partially dislocated 3-piece IOL -- spontaneously dislocated completely into vitreous cavity on 1.23.21  - POD1 s/p 25g PPV w/ IOL explantation and secondary sutured IOL OD -- Akreos AO60 lens, 17.5D -- 02.11.2021             - doing well this morning with IOL in good position             - IOP okay at 18             - start   PF 4x/day OD                          zymaxid QID OD                         Brimonidine QD OD                          PSO ung QID OD              - eye shield when sleeping              - post op drop and positioning instructions reviewed              - tylenol/ibuprofen for pain              - f/u 1 week   3-5. Rhegmatogenous retinal detachment, right eye  - bullous, superior, mac off detachment, onset of foveal involvement 11.11.20 by history  - Bi-lobed superior detachment, superior lobe spanning 1000-0130, nasal lobe 0130-0400  - lattice degeneration with microtears noted at 1030  - s/p PPV/PFC/EL/FAX/14% C3F8 OD, 11.12.20             - intraop: HST at 2 oclock was found; also detachment had progressed to subtotal detachment spanning 9 oclock to 6 oclock (going in clockwise direction); also severe zonular insufficiency with IOL very mobile throughout case -- did not dislocate completely during the case or immediately post op  - doing well              - retina attached and in good position --  good laser in place  6. History of retinal defects s/p laser  retinopexy OU with Dr. Zigmund Daniel in 2013  - OD laser at 0900  - OS laser at 1030  - stable  7. Diabetes mellitus, type 2 without retinopathy  - The incidence, risk factors for progression, natural history and treatment options for diabetic retinopathy  were discussed with patient.    - The need for close monitoring of blood glucose, blood pressure, and serum lipids, avoiding cigarette or any type of tobacco, and the need for long term follow up was also discussed with patient  8,9. Hypertensive retinopathy OU  - discussed importance of tight BP control  - monitor  10. Pseudophakia OU  - s/p CE/IOL OU (Dr. [name])  - 3 piece IOL OD was completely displaced into vitreous -- now with sutured Akreos IOL in good position -- see above  - OS 3-piece IOL in good position  - monitor   Ophthalmic Meds Ordered this visit:  No orders of the defined types were placed in this encounter.      Return in about 1 week (around 09/20/2019) for f/u dislocated IOL.  There are no Patient Instructions on file for this visit.   Explained the diagnoses, plan, and follow up with the patient and they expressed understanding.  Patient expressed understanding of the importance of proper follow up care.  This document serves as a record of services personally performed by Gardiner Sleeper, MD, PhD. It was created on their behalf by Ernest Mallick, OA, an ophthalmic assistant. The creation of this record is the provider's dictation and/or activities during the visit.    Electronically signed by: Ernest Mallick, OA 02.08.2021 8:27 PM   Gardiner Sleeper, M.D., Ph.D. Diseases & Surgery of the Retina and Vitreous [facility]  I have reviewed the above documentation for accuracy and completeness, and I agree with the above. Gardiner Sleeper, M.D., Ph.D. 09/15/19 8:27 PM    Abbreviations: M myopia (nearsighted); A astigmatism; H  hyperopia (farsighted); P presbyopia; Mrx spectacle prescription;  CTL contact lenses; OD right eye; OS left eye; OU both eyes  XT exotropia; ET esotropia; PEK punctate epithelial keratitis; PEE punctate epithelial erosions; DES dry eye syndrome; MGD meibomian gland dysfunction; ATs artificial tears; PFAT's preservative free artificial tears; [place] nuclear sclerotic cataract; PSC posterior subcapsular cataract; ERM epi-retinal membrane; PVD posterior vitreous detachment; RD retinal detachment; DM diabetes mellitus; DR diabetic retinopathy; NPDR non-proliferative diabetic retinopathy; PDR proliferative diabetic retinopathy; CSME clinically significant macular edema; DME diabetic macular edema; dbh dot blot hemorrhages; CWS cotton wool spot; POAG primary open angle glaucoma; C/D cup-to-disc ratio; HVF humphrey visual field; GVF goldmann visual field; OCT optical coherence tomography; IOP intraocular pressure; BRVO Branch retinal vein occlusion; CRVO central retinal vein occlusion; CRAO central retinal artery occlusion; BRAO branch retinal artery occlusion; RT retinal tear; SB scleral buckle; PPV pars plana vitrectomy; VH Vitreous hemorrhage; PRP panretinal laser photocoagulation; IVK intravitreal kenalog; VMT vitreomacular traction; MH Macular hole;  NVD neovascularization of the disc; NVE neovascularization elsewhere; AREDS age related eye disease study; ARMD age related macular degeneration; POAG primary open angle glaucoma; EBMD epithelial/anterior basement membrane dystrophy; ACIOL anterior chamber intraocular lens; IOL intraocular lens; PCIOL posterior chamber intraocular lens; Phaco/IOL phacoemulsification with intraocular lens placement; [place] photorefractive keratectomy; LASIK laser assisted in situ keratomileusis; HTN hypertension; DM diabetes mellitus; COPD chronic obstructive pulmonary disease

## 2019-09-09 NOTE — Progress Notes (Signed)
   Covid-19 Vaccination Clinic  Name:  Cody Phelps    MRN: [id number] DOB: [birth year]  09/09/2019  [name] was observed post Covid-19 immunization for 15 minutes without incidence. He was provided with Vaccine Information Sheet and instruction to access the V-Safe system.   [name] was instructed to call 911 with any severe reactions post vaccine: Marland Kitchen Difficulty breathing  . Swelling of your face and throat  . A fast heartbeat  . A bad rash all over your body  . Dizziness and weakness    Immunizations Administered    Name Date Dose VIS Date Route   Pfizer COVID-19 Vaccine 09/09/2019  4:51 PM 0.3 mL 07/12/2019 Intramuscular   Manufacturer: [place]   Lot: [id number]   [place]: [id number]
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
## 2019-09-11 ENCOUNTER — Encounter (HOSPITAL_COMMUNITY): Payer: Self-pay | Admitting: Ophthalmology

## 2019-09-11 ENCOUNTER — Other Ambulatory Visit: Payer: Self-pay
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
## 2019-09-11 NOTE — Progress Notes (Signed)
Spoke with wife Butch Penny.  She states patient does not have any SOB, fever, cough or chest pain.  PCP - Dr Kirkland Hun Cardiologist - Dr Adrian Prows Oncology - Dr [name]  Chest x-ray - denies EKG - 08/23/19 Stress Test - denies ECHO TEE - 05/30/18  Cardiac Cath - 03/01/18  Sleep Study - Yes CPAP - uses nightly  Fasting Blood Sugar - 100s Checks Blood Sugar - 1 time a day  . THE NIGHT BEFORE SURGERY, take 1/2 of your normal dose of lantus insulin.      . If your blood sugar is less than 70 mg/dL, you will need to treat for low blood sugar: o Treat a low blood sugar (less than 70 mg/dL) with  cup of clear juice (cranberry or apple), 4 glucose tablets, OR glucose gel. o Recheck blood sugar in 15 minutes after treatment (to make sure it is greater than 70 mg/dL). If your blood sugar is not greater than 70 mg/dL on recheck, call [phone] for further instructions.  Blood Thinner Instructions:  Hold xarelto 48 hours prior to surgery per Md.  Last dose was on 09/07/19.       Anesthesia review: Yes  STOP now taking any Aspirin (unless otherwise instructed by your surgeon), Aleve, Naproxen, Ibuprofen, Motrin, Advil, Goody's, BC's, all herbal medications, fish oil, and all vitamins.   Coronavirus Screening Covid test on 09/09/19 was negative.  Wife Butch Penny verbalized understanding of instructions that were given via phone.

## 2019-09-12 ENCOUNTER — Encounter (HOSPITAL_COMMUNITY): Payer: Self-pay | Admitting: Ophthalmology

## 2019-09-12 ENCOUNTER — Ambulatory Visit (HOSPITAL_COMMUNITY): Payer: Medicare Other | Admitting: Vascular Surgery

## 2019-09-12 ENCOUNTER — Ambulatory Visit (HOSPITAL_COMMUNITY)
Admission: RE | Admit: 2019-09-12 | Discharge: 2019-09-12 | Disposition: A | Discharge status: Home / Self Care | Payer: Medicare Other | Attending: Ophthalmology | Admitting: Ophthalmology

## 2019-09-12 ENCOUNTER — Encounter (HOSPITAL_COMMUNITY)
Admission: RE | Disposition: A | Discharge status: Home / Self Care | Payer: Self-pay | Source: Home / Self Care | Attending: Ophthalmology

## 2019-09-12 ENCOUNTER — Other Ambulatory Visit: Payer: Self-pay

## 2019-09-12 DIAGNOSIS — H44751 Retained (nonmagnetic) (old) foreign body in vitreous body, right eye: Secondary | ICD-10-CM

## 2019-09-12 DIAGNOSIS — G4733 Obstructive sleep apnea (adult) (pediatric): Secondary | ICD-10-CM | POA: Diagnosis not present

## 2019-09-12 DIAGNOSIS — H4301 Vitreous prolapse, right eye: Secondary | ICD-10-CM | POA: Diagnosis not present

## 2019-09-12 DIAGNOSIS — Z8669 Personal history of other diseases of the nervous system and sense organs: Secondary | ICD-10-CM | POA: Diagnosis not present

## 2019-09-12 DIAGNOSIS — Z87891 Personal history of nicotine dependence: Secondary | ICD-10-CM | POA: Diagnosis not present

## 2019-09-12 DIAGNOSIS — Z79899 Other long term (current) drug therapy: Secondary | ICD-10-CM | POA: Insufficient documentation

## 2019-09-12 DIAGNOSIS — I252 Old myocardial infarction: Secondary | ICD-10-CM | POA: Insufficient documentation

## 2019-09-12 DIAGNOSIS — Z953 Presence of xenogenic heart valve: Secondary | ICD-10-CM | POA: Insufficient documentation

## 2019-09-12 DIAGNOSIS — I480 Paroxysmal atrial fibrillation: Secondary | ICD-10-CM | POA: Insufficient documentation

## 2019-09-12 DIAGNOSIS — Z794 Long term (current) use of insulin: Secondary | ICD-10-CM | POA: Insufficient documentation

## 2019-09-12 DIAGNOSIS — D509 Iron deficiency anemia, unspecified: Secondary | ICD-10-CM | POA: Insufficient documentation

## 2019-09-12 DIAGNOSIS — J449 Chronic obstructive pulmonary disease, unspecified: Secondary | ICD-10-CM | POA: Diagnosis not present

## 2019-09-12 DIAGNOSIS — F0390 Unspecified dementia without behavioral disturbance: Secondary | ICD-10-CM | POA: Diagnosis not present

## 2019-09-12 DIAGNOSIS — Z7901 Long term (current) use of anticoagulants: Secondary | ICD-10-CM | POA: Diagnosis not present

## 2019-09-12 DIAGNOSIS — Z8546 Personal history of malignant neoplasm of prostate: Secondary | ICD-10-CM | POA: Insufficient documentation

## 2019-09-12 DIAGNOSIS — E119 Type 2 diabetes mellitus without complications: Secondary | ICD-10-CM | POA: Diagnosis not present

## 2019-09-12 DIAGNOSIS — Z96641 Presence of right artificial hip joint: Secondary | ICD-10-CM | POA: Insufficient documentation

## 2019-09-12 DIAGNOSIS — E782 Mixed hyperlipidemia: Secondary | ICD-10-CM | POA: Insufficient documentation

## 2019-09-12 DIAGNOSIS — I2510 Atherosclerotic heart disease of native coronary artery without angina pectoris: Secondary | ICD-10-CM | POA: Insufficient documentation

## 2019-09-12 DIAGNOSIS — I110 Hypertensive heart disease with heart failure: Secondary | ICD-10-CM | POA: Insufficient documentation

## 2019-09-12 DIAGNOSIS — T8522XA Displacement of intraocular lens, initial encounter: Secondary | ICD-10-CM | POA: Diagnosis present

## 2019-09-12 DIAGNOSIS — Z951 Presence of aortocoronary bypass graft: Secondary | ICD-10-CM | POA: Insufficient documentation

## 2019-09-12 DIAGNOSIS — I5042 Chronic combined systolic (congestive) and diastolic (congestive) heart failure: Secondary | ICD-10-CM | POA: Insufficient documentation

## 2019-09-12 DIAGNOSIS — Y838 Other surgical procedures as the cause of abnormal reaction of the patient, or of later complication, without mention of misadventure at the time of the procedure: Secondary | ICD-10-CM | POA: Insufficient documentation

## 2019-09-12 DIAGNOSIS — K219 Gastro-esophageal reflux disease without esophagitis: Secondary | ICD-10-CM | POA: Diagnosis not present

## 2019-09-12 HISTORY — PX: PLACEMENT AND SUTURE OF SECONDARY INTRAOCULAR LENS: SHX5338

## 2019-09-12 HISTORY — PX: PARS PLANA VITRECTOMY: SHX2166

## 2019-09-12 HISTORY — PX: REMOVAL RETAINED LENS: SHX6464

## 2019-09-12 LAB — BASIC METABOLIC PANEL
Anion gap: 9 (ref 5–15)
BUN: 20 mg/dL (ref 8–23)
CO2: 22 mmol/L (ref 22–32)
Calcium: 9 mg/dL (ref 8.9–10.3)
Chloride: 106 mmol/L (ref 98–111)
Creatinine, Ser: 1.38 mg/dL — ABNORMAL HIGH (ref 0.61–1.24)
GFR calc Af Amer: 57 mL/min — ABNORMAL LOW (ref >60)
GFR calc non Af Amer: 49 mL/min — ABNORMAL LOW (ref >60)
Glucose, Bld: 134 mg/dL — ABNORMAL HIGH (ref 70–99)
Potassium: 3.7 mmol/L (ref 3.5–5.1)
Sodium: 137 mmol/L (ref 135–145)

## 2019-09-12 LAB — CBC
HCT: 33 % — ABNORMAL LOW (ref 39.0–52.0)
Hemoglobin: 10.6 g/dL — ABNORMAL LOW (ref 13.0–17.0)
MCH: 29.2 pg (ref 26.0–34.0)
MCHC: 32.1 g/dL (ref 30.0–36.0)
MCV: 90.9 fL (ref 80.0–100.0)
Platelets: 170 10*3/uL (ref 150–400)
RBC: 3.63 MIL/uL — ABNORMAL LOW (ref 4.22–5.81)
RDW: 14.7 % (ref 11.5–15.5)
WBC: 5.6 10*3/uL (ref 4.0–10.5)
nRBC: 0 % (ref 0.0–0.2)

## 2019-09-12 LAB — GLUCOSE, CAPILLARY
Glucose-Capillary: 134 mg/dL — ABNORMAL HIGH (ref 70–99)
Glucose-Capillary: 154 mg/dL — ABNORMAL HIGH (ref 70–99)

## 2019-09-12 LAB — PROTIME-INR
INR: 1 (ref 0.8–1.2)
Prothrombin Time: 13.4 seconds (ref 11.4–15.2)

## 2019-09-12 SURGERY — REMOVAL, RETAINED LENS MATTER
Anesthesia: General | Site: Eye | Laterality: Right

## 2019-09-12 MED ORDER — GATIFLOXACIN 0.5 % OP SOLN
OPHTHALMIC | Status: AC
  Filled 2019-09-12: qty 2.5

## 2019-09-12 MED ORDER — ROCURONIUM BROMIDE 10 MG/ML (PF) SYRINGE
PREFILLED_SYRINGE | INTRAVENOUS | Status: AC
  Filled 2019-09-12: qty 10

## 2019-09-12 MED ORDER — OXYCODONE HCL 5 MG PO TABS: 5.0000 mg | ORAL_TABLET | Freq: Once | ORAL | Status: DC | PRN

## 2019-09-12 MED ORDER — SODIUM CHLORIDE (PF) 0.9 % IJ SOLN
INTRAMUSCULAR | Status: AC
  Filled 2019-09-12: qty 10

## 2019-09-12 MED ORDER — ROCURONIUM BROMIDE 10 MG/ML (PF) SYRINGE
PREFILLED_SYRINGE | INTRAVENOUS | Status: DC | PRN
  Administered 2019-09-12: 30 mg via INTRAVENOUS
  Administered 2019-09-12: 70 mg via INTRAVENOUS
  Administered 2019-09-12: 30 mg via INTRAVENOUS

## 2019-09-12 MED ORDER — EPHEDRINE 5 MG/ML INJ
INTRAVENOUS | Status: AC
  Filled 2019-09-12: qty 10

## 2019-09-12 MED ORDER — DORZOLAMIDE HCL-TIMOLOL MAL 2-0.5 % OP SOLN
OPHTHALMIC | Status: AC
  Filled 2019-09-12: qty 10

## 2019-09-12 MED ORDER — FENTANYL CITRATE (PF) 250 MCG/5ML IJ SOLN
INTRAMUSCULAR | Status: DC | PRN
  Administered 2019-09-12: 50 ug via INTRAVENOUS

## 2019-09-12 MED ORDER — PREDNISOLONE ACETATE 1 % OP SUSP
OPHTHALMIC | Status: DC | PRN
  Administered 2019-09-12: 1 [drp] via OPHTHALMIC

## 2019-09-12 MED ORDER — EPHEDRINE SULFATE-NACL 50-0.9 MG/10ML-% IV SOSY
PREFILLED_SYRINGE | INTRAVENOUS | Status: DC | PRN
  Administered 2019-09-12: 5 mg via INTRAVENOUS
  Administered 2019-09-12: 10 mg via INTRAVENOUS
  Administered 2019-09-12: 5 mg via INTRAVENOUS

## 2019-09-12 MED ORDER — LIDOCAINE 2% (20 MG/ML) 5 ML SYRINGE
INTRAMUSCULAR | Status: DC | PRN
  Administered 2019-09-12: 60 mg via INTRAVENOUS

## 2019-09-12 MED ORDER — CEFTAZIDIME 1 G IJ SOLR
INTRAMUSCULAR | Status: AC
  Filled 2019-09-12: qty 1

## 2019-09-12 MED ORDER — NA CHONDROIT SULF-NA HYALURON 40-30 MG/ML IO SOLN
INTRAOCULAR | Status: DC | PRN
  Administered 2019-09-12 (×2): 0.5 mL via INTRAOCULAR

## 2019-09-12 MED ORDER — SUGAMMADEX SODIUM 200 MG/2ML IV SOLN
INTRAVENOUS | Status: DC | PRN
  Administered 2019-09-12: 200 mg via INTRAVENOUS

## 2019-09-12 MED ORDER — STERILE WATER FOR INJECTION IJ SOLN
INTRAMUSCULAR | Status: AC
  Filled 2019-09-12: qty 10

## 2019-09-12 MED ORDER — CARBACHOL 0.01 % IO SOLN
INTRAOCULAR | Status: AC
  Filled 2019-09-12: qty 1.5

## 2019-09-12 MED ORDER — FENTANYL CITRATE (PF) 100 MCG/2ML IJ SOLN: 25.0000 ug | INTRAMUSCULAR | Status: DC | PRN

## 2019-09-12 MED ORDER — BSS PLUS IO SOLN
INTRAOCULAR | Status: AC
  Filled 2019-09-12: qty 500

## 2019-09-12 MED ORDER — CYCLOPENTOLATE HCL 1 % OP SOLN
1.0000 [drp] | OPHTHALMIC | Status: AC | PRN
  Administered 2019-09-12 (×3): 1 [drp] via OPHTHALMIC
  Filled 2019-09-12: qty 2

## 2019-09-12 MED ORDER — GATIFLOXACIN 0.5 % OP SOLN OPTIME - NO CHARGE
OPHTHALMIC | Status: DC | PRN
  Administered 2019-09-12: 15:00:00 1 [drp] via OPHTHALMIC

## 2019-09-12 MED ORDER — SODIUM CHLORIDE 0.9 % IV SOLN: INTRAVENOUS | Status: DC

## 2019-09-12 MED ORDER — DEXAMETHASONE SODIUM PHOSPHATE 10 MG/ML IJ SOLN
INTRAMUSCULAR | Status: AC
  Filled 2019-09-12: qty 1

## 2019-09-12 MED ORDER — POLYMYXIN B SULFATE 500000 UNITS IJ SOLR
INTRAMUSCULAR | Status: AC
  Filled 2019-09-12: qty 500000

## 2019-09-12 MED ORDER — ATROPINE SULFATE 1 % OP SOLN
1.0000 [drp] | OPHTHALMIC | Status: AC | PRN
  Administered 2019-09-12 (×3): 1 [drp] via OPHTHALMIC
  Filled 2019-09-12: qty 5

## 2019-09-12 MED ORDER — ACETAMINOPHEN 10 MG/ML IV SOLN: 1000.0000 mg | Freq: Once | INTRAVENOUS | Status: DC | PRN

## 2019-09-12 MED ORDER — BACITRACIN-POLYMYXIN B 500-10000 UNIT/GM OP OINT
TOPICAL_OINTMENT | OPHTHALMIC | Status: DC | PRN
  Administered 2019-09-12: 1 via OPHTHALMIC

## 2019-09-12 MED ORDER — DORZOLAMIDE HCL-TIMOLOL MAL 2-0.5 % OP SOLN
OPHTHALMIC | Status: DC | PRN
  Administered 2019-09-12: 1 [drp] via OPHTHALMIC

## 2019-09-12 MED ORDER — FENTANYL CITRATE (PF) 250 MCG/5ML IJ SOLN
INTRAMUSCULAR | Status: AC
  Filled 2019-09-12: qty 5

## 2019-09-12 MED ORDER — ONDANSETRON HCL 4 MG/2ML IJ SOLN
INTRAMUSCULAR | Status: DC | PRN
  Administered 2019-09-12: 4 mg via INTRAVENOUS

## 2019-09-12 MED ORDER — ACETAMINOPHEN 160 MG/5ML PO SOLN: 1000.0000 mg | Freq: Once | ORAL | Status: DC | PRN

## 2019-09-12 MED ORDER — BACITRACIN-POLYMYXIN B 500-10000 UNIT/GM OP OINT
TOPICAL_OINTMENT | OPHTHALMIC | Status: AC
  Filled 2019-09-12: qty 3.5

## 2019-09-12 MED ORDER — 0.9 % SODIUM CHLORIDE (POUR BTL) OPTIME
TOPICAL | Status: DC | PRN
  Administered 2019-09-12: 1000 mL

## 2019-09-12 MED ORDER — PHENYLEPHRINE HCL 10 % OP SOLN
1.0000 [drp] | OPHTHALMIC | Status: AC | PRN
  Administered 2019-09-12 (×3): 1 [drp] via OPHTHALMIC
  Filled 2019-09-12: qty 5

## 2019-09-12 MED ORDER — EPINEPHRINE PF 1 MG/ML IJ SOLN
INTRAOCULAR | Status: DC | PRN
  Administered 2019-09-12 (×2): 500 mL

## 2019-09-12 MED ORDER — ONDANSETRON HCL 4 MG/2ML IJ SOLN
INTRAMUSCULAR | Status: AC
  Filled 2019-09-12: qty 2

## 2019-09-12 MED ORDER — DEXAMETHASONE SODIUM PHOSPHATE 10 MG/ML IJ SOLN
INTRAMUSCULAR | Status: DC | PRN
  Administered 2019-09-12: 4 mg via INTRAVENOUS

## 2019-09-12 MED ORDER — PROPOFOL 10 MG/ML IV BOLUS
INTRAVENOUS | Status: DC | PRN
  Administered 2019-09-12: 130 mg via INTRAVENOUS

## 2019-09-12 MED ORDER — TOBRAMYCIN-DEXAMETHASONE 0.3-0.1 % OP OINT
TOPICAL_OINTMENT | OPHTHALMIC | Status: AC
  Filled 2019-09-12: qty 3.5

## 2019-09-12 MED ORDER — STERILE WATER FOR INJECTION IJ SOLN
INTRAMUSCULAR | Status: DC | PRN
  Administered 2019-09-12: 20 mL

## 2019-09-12 MED ORDER — PROPARACAINE HCL 0.5 % OP SOLN
1.0000 [drp] | OPHTHALMIC | Status: AC | PRN
  Administered 2019-09-12 (×3): 1 [drp] via OPHTHALMIC
  Filled 2019-09-12: qty 15

## 2019-09-12 MED ORDER — TRIAMCINOLONE ACETONIDE 40 MG/ML IJ SUSP
INTRAMUSCULAR | Status: DC | PRN
  Administered 2019-09-12: 40 mg

## 2019-09-12 MED ORDER — PHENYLEPHRINE HCL-NACL 10-0.9 MG/250ML-% IV SOLN
INTRAVENOUS | Status: DC | PRN
  Administered 2019-09-12: 25 ug/min via INTRAVENOUS

## 2019-09-12 MED ORDER — BALANCED SALT IO SOLN
INTRAOCULAR | Status: DC | PRN
  Administered 2019-09-12: 15 mL via INTRAOCULAR

## 2019-09-12 MED ORDER — ATROPINE SULFATE 1 % OP SOLN
OPHTHALMIC | Status: AC
  Filled 2019-09-12: qty 5

## 2019-09-12 MED ORDER — NA CHONDROIT SULF-NA HYALURON 40-30 MG/ML IO SOLN
INTRAOCULAR | Status: AC
  Filled 2019-09-12: qty 1.5

## 2019-09-12 MED ORDER — PREDNISOLONE ACETATE 1 % OP SUSP
OPHTHALMIC | Status: AC
  Filled 2019-09-12: qty 5

## 2019-09-12 MED ORDER — TROPICAMIDE 1 % OP SOLN
1.0000 [drp] | OPHTHALMIC | Status: AC | PRN
  Administered 2019-09-12 (×3): 1 [drp] via OPHTHALMIC
  Filled 2019-09-12: qty 15

## 2019-09-12 MED ORDER — LIDOCAINE 2% (20 MG/ML) 5 ML SYRINGE
INTRAMUSCULAR | Status: AC
  Filled 2019-09-12: qty 5

## 2019-09-12 MED ORDER — BRIMONIDINE TARTRATE 0.2 % OP SOLN
OPHTHALMIC | Status: AC
  Filled 2019-09-12: qty 5

## 2019-09-12 MED ORDER — EPINEPHRINE PF 1 MG/ML IJ SOLN
INTRAMUSCULAR | Status: AC
  Filled 2019-09-12: qty 1

## 2019-09-12 MED ORDER — STERILE WATER FOR IRRIGATION IR SOLN
Status: DC | PRN
  Administered 2019-09-12: 1000 mL

## 2019-09-12 MED ORDER — BSS IO SOLN
INTRAOCULAR | Status: AC
  Filled 2019-09-12: qty 15

## 2019-09-12 MED ORDER — ACETAMINOPHEN 500 MG PO TABS: 1000.0000 mg | ORAL_TABLET | Freq: Once | ORAL | Status: DC | PRN

## 2019-09-12 MED ORDER — BRIMONIDINE TARTRATE 0.2 % OP SOLN
OPHTHALMIC | Status: DC | PRN
  Administered 2019-09-12: 1 [drp] via OPHTHALMIC

## 2019-09-12 MED ORDER — TRIAMCINOLONE ACETONIDE 40 MG/ML IJ SUSP
INTRAMUSCULAR | Status: AC
  Filled 2019-09-12: qty 5

## 2019-09-12 MED ORDER — OXYCODONE HCL 5 MG/5ML PO SOLN: 5.0000 mg | Freq: Once | ORAL | Status: DC | PRN

## 2019-09-12 SURGICAL SUPPLY — 61 items
APL SWBSTK 6 STRL LF DISP (MISCELLANEOUS) ×4
APPLICATOR COTTON TIP 6 STRL (MISCELLANEOUS) ×10 IMPLANT
APPLICATOR COTTON TIP 6IN STRL (MISCELLANEOUS) ×12
BAND WRIST GAS GREEN (MISCELLANEOUS) IMPLANT
BLADE EYE CATARACT 19 1.4 BEAV (BLADE) IMPLANT
BLADE KERATOME 2.75 (BLADE) ×2 IMPLANT
BLADE KERATOME 2.75MM (BLADE) ×1
BNDG EYE OVAL (GAUZE/BANDAGES/DRESSINGS) ×3 IMPLANT
CABLE BIPOLOR RESECTION CORD (MISCELLANEOUS) ×3 IMPLANT
CANNULA ANT/CHMB 27G (MISCELLANEOUS) ×1 IMPLANT
CANNULA ANT/CHMB 27GA (MISCELLANEOUS) ×3 IMPLANT
CANNULA FLEX TIP 25G (CANNULA) ×3 IMPLANT
CANNULA TROCAR 25 GA VLV (OPHTHALMIC) ×3 IMPLANT
CANNULA TROCAR 25G 4 VLV (OPHTHALMIC) IMPLANT
CANNULA VLV SOFT TIP 25G (OPHTHALMIC) IMPLANT
CANNULA VLV SOFT TIP 25GA (OPHTHALMIC) IMPLANT
CLOSURE STERI-STRIP 1/2X4 (GAUZE/BANDAGES/DRESSINGS) ×1
CLSR STERI-STRIP ANTIMIC 1/2X4 (GAUZE/BANDAGES/DRESSINGS) ×2 IMPLANT
DRAPE MICROSCOPE LEICA 46X105 (MISCELLANEOUS) ×3 IMPLANT
DRAPE OPHTHALMIC 77X100 STRL (CUSTOM PROCEDURE TRAY) ×3 IMPLANT
GAS WRIST BAND GREEN (MISCELLANEOUS)
GLOVE BIO SURGEON STRL SZ7.5 (GLOVE) ×6 IMPLANT
GLOVE BIOGEL M 7.0 STRL (GLOVE) ×3 IMPLANT
GOWN STRL REUS W/ TWL LRG LVL3 (GOWN DISPOSABLE) ×2 IMPLANT
GOWN STRL REUS W/ TWL XL LVL3 (GOWN DISPOSABLE) ×1 IMPLANT
GOWN STRL REUS W/TWL LRG LVL3 (GOWN DISPOSABLE) ×6
GOWN STRL REUS W/TWL XL LVL3 (GOWN DISPOSABLE) ×3
KIT BASIN OR (CUSTOM PROCEDURE TRAY) ×3 IMPLANT
LENS CONTACT P50 8.4 DAY/NIGHT (INTRAOCULAR LENS) IMPLANT
LENS IOL AKREOS 17.5 (Intraocular Lens) ×2 IMPLANT
LENS VITRECTOMY FLAT OCLR DISP (MISCELLANEOUS) IMPLANT
MICROPICK 25G (MISCELLANEOUS)
NDL 18GX1X1/2 (RX/OR ONLY) (NEEDLE) ×1 IMPLANT
NDL 25GX 5/8IN NON SAFETY (NEEDLE) ×3 IMPLANT
NDL HYPO 30X.5 LL (NEEDLE) ×2 IMPLANT
NDL PRECISIONGLIDE 27X1.5 (NEEDLE) IMPLANT
NEEDLE 18GX1X1/2 (RX/OR ONLY) (NEEDLE) ×9 IMPLANT
NEEDLE 25GX 5/8IN NON SAFETY (NEEDLE) ×9 IMPLANT
NEEDLE HYPO 30X.5 LL (NEEDLE) ×6 IMPLANT
NEEDLE PRECISIONGLIDE 27X1.5 (NEEDLE) IMPLANT
NS IRRIG 1000ML POUR BTL (IV SOLUTION) ×3 IMPLANT
PACK VITRECTOMY CUSTOM (CUSTOM PROCEDURE TRAY) ×3 IMPLANT
PAD ARMBOARD 7.5X6 YLW CONV (MISCELLANEOUS) ×4 IMPLANT
PAK PIK CATARACT/RETINA 23GA (OPHTHALMIC) ×3 IMPLANT
PENCIL BIPOLAR 25GA STR DISP (OPHTHALMIC RELATED) ×3 IMPLANT
PICK MICROPICK 25G (MISCELLANEOUS) IMPLANT
PROBE ENDO DIATHERMY 25G (MISCELLANEOUS) ×2 IMPLANT
PROBE LASER ILLUM FLEX CVD 25G (OPHTHALMIC) ×2 IMPLANT
RETRACTOR IRIS FLEX 25G GRIESH (INSTRUMENTS) ×6 IMPLANT
SPEAR EYE SURG WECK-CEL (MISCELLANEOUS) ×2 IMPLANT
SPONGE SURGIFOAM ABS GEL 12-7 (HEMOSTASIS) IMPLANT
STOPCOCK 4 WAY LG BORE MALE ST (IV SETS) IMPLANT
SUT ETHILON 10 0 CS140 6 (SUTURE) ×3 IMPLANT
SUT VICRYL 7 0 TG140 8 (SUTURE) ×3 IMPLANT
SYR 10ML LL (SYRINGE) ×3 IMPLANT
SYR 20ML LL LF (SYRINGE) ×3 IMPLANT
SYR 5ML LL (SYRINGE) ×3 IMPLANT
SYR TB 1ML LUER SLIP (SYRINGE) ×6 IMPLANT
TOWEL GREEN STERILE FF (TOWEL DISPOSABLE) ×3 IMPLANT
TUBING HIGH PRESS EXTEN 6IN (TUBING) ×3 IMPLANT
WATER STERILE IRR 1000ML POUR (IV SOLUTION) ×3 IMPLANT

## 2019-09-12 NOTE — Interval H&P Note (Signed)
History and Physical Interval Note:  09/12/2019 9:05 AM  Cody Phelps  has presented today for surgery, with the diagnosis of dislocated IOL right eye.  The various methods of treatment have been discussed with the patient and family. After consideration of risks, benefits and other options for treatment, the patient has consented to  Procedure(s): REMOVAL RETAINED LENS (Right) PLACEMENT AND SUTURE OF SECONDARY INTRAOCULAR LENS (Right) as a surgical intervention.  The patient's history has been reviewed, patient examined, no change in status, stable for surgery.  I have reviewed the patient's chart and labs.  Questions were answered to the patient's satisfaction.     [name]

## 2019-09-12 NOTE — Brief Op Note (Signed)
09/12/2019  3:38 PM  PATIENT:  Cody Phelps  79 y.o. male  PRE-OPERATIVE DIAGNOSIS:  dislocated Intraocular Lens right eye  POST-OPERATIVE DIAGNOSIS:  dislocated Intraocular Lens right eye  PROCEDURE:  Procedure(s): REMOVAL RETAINED LENS (Right) PLACEMENT AND SUTURE OF SECONDARY INTRAOCULAR LENS (Right) Pars Plana Vitrectomy With 25 Gauge (Right)  SURGEON:  Surgeon(s) and Role:    [name], MD - Primary  ASSISTANTS: Ernest Mallick, Ophthalmic Assistant  ANESTHESIA:   general  EBL:  Minimal   BLOOD ADMINISTERED:none  DRAINS: none   LOCAL MEDICATIONS USED:  NONE  SPECIMEN:  No Specimen  DISPOSITION OF SPECIMEN:  N/A  COUNTS:  YES  TOURNIQUET:  * No tourniquets in log *  DICTATION: .Note written in EPIC  PLAN OF CARE: Discharge to home after PACU  PATIENT DISPOSITION:  PACU - hemodynamically stable.   Delay start of Pharmacological VTE agent (>24hrs) due to surgical blood loss or risk of bleeding: yes

## 2019-09-12 NOTE — Anesthesia Procedure Notes (Signed)
Procedure Name: Intubation Date/Time: 09/12/2019 11:37 AM Performed by: Colin Benton, CRNA Pre-anesthesia Checklist: Patient identified, Emergency Drugs available, Suction available and Patient being monitored Patient Re-evaluated:Patient Re-evaluated prior to induction Oxygen Delivery Method: Circle system utilized Preoxygenation: Pre-oxygenation with 100% oxygen Induction Type: IV induction Ventilation: Mask ventilation without difficulty Laryngoscope Size: Miller and 2 Grade View: Grade I Tube type: Oral Tube size: 7.5 mm Number of attempts: 1 Airway Equipment and Method: Stylet Placement Confirmation: ETT inserted through vocal cords under direct vision,  positive ETCO2 and breath sounds checked- equal and bilateral Secured at: 22 cm Tube secured with: Tape Dental Injury: Teeth and Oropharynx as per pre-operative assessment

## 2019-09-12 NOTE — Op Note (Signed)
Date of Surgery: 2.11.2021   Pre-Op Diagnosis:  1. Dislocation of Intraocular Lens Implant, Right Eye  2. Vitreous prolapse, Right Eye   Post-op Diagnosis:  1. Dislocation of Intraocular Lens Implant, Right Eye  2. Vitreous prolapse, Right Eye   Procedure:  1. 25 gauge Pars Plana Vitrectomy, right eye 2. Intraocular Lens Exchange, right eye 3. Intraocular Lens Insertion with Scleral Fixation, right eye   Surgeon: [name], MD,PhD   Assistant: Ernest Mallick, OA   Anesthesia: General anesthesia with endotracheal intubation   Estimated Blood Loss: <5 cc   Lens Implant: Akreos AO60 17.5 Diopter (SN [id number])   Complications: None   Description of the Procedure:  Patient was seen in the Pre-operative area. After all remaining questions were answered, the operative "Right Eye" was marked and the informed consent was confirmed.The patient was brought back to the operating room by the nursing staff. The patient was succesfully placed under general anesthesia.  A time-out was performed. All in attendance (the nursing staff, anesthesia staff, and ophthalmology staff) agreed upon the patient, type of surgery, and the location of surgery. The operative eye was prepped and draped in sterile fashion.    A toric IOL marker was used to mark the Akreos IOL axis. Marks were made in the superonasal and inferotemporal quadrants of the cornea. Peritomies were then performed from 1200-0300 and 0600-0900 in . Hemostasis was achieved on the bare sclera using wetfield cautery. The paired sclerotomy sites for the IOL implant were marked using calipers -- 3 mm posterior to limbus and 5 mm apart from each other about the corneal axis previously marked. A valved trocar was placed in the inferonasal quadrant, 3.12mm posterior to the limbus. The infusion line was brought to the operative field and found to be functional and free of air bubbles. The infusion line was inserted in this trocar, visualized with the  light pipe, and turned on. The infusion was secured with steristrips. A second valved trocar was placed in the superotemporal quadrant in a beveled fashion. The inferior IOL marking in the superonasal quadrant was used to place the 3rd trocar--straight in with bevel of blade parallel to the limbus.     Next, vitrectomy was performed under direct visualization to clear the residual anterior hyaloid. Of note, this patient has a history of PPV in this eye for a retinal detachment. There was minimal residual vitreous. The displaced 3-piece silicon IOL was sitting on the macula inferiorly, encased in the capsular bag. Care was taken not to move the lens significantly during vitrectomy. Next under the wide-field viewing system, the peripheral retina was inspected -- there were no retinal tears on 360 degree inspection and there was good 360 peripheral laser in place just posterior to ora.   Attention was then turned to the lens exchange. Anterior chamber paracenteses were created at 2 (superonasal) and 900 (temporal). [facility] was filled with viscoat to protect the corneal endothelium. Under BIOM visualization, the IOL was picked up off the retina using a 25g MaxGrip forceps and brought just posterior to the iris. The BIOM was swung out and under direct visualization, a second MaxGrip passed through the superonasal paracentesis was used to assist in placing the IOL anterior to the iris diaphragm.The light pipe and kuglen manipulator were used to move the dislocated IOL anterior to the iris diaphragm. A 2.75 microkeratome blade was used to create a triplanar wound at 12 oclock. The wound was extended to a width of ~3.25 mm. Initial attempts were  made to try to roll the IOL around a pair of straight tying forceps for explantation, but the IOL optic was too slippery to allow for this technique. At this time, the pupillary dilation was compromised, thus iris retractors were used to open the pupil back up. IOL scissors were  then used to bisect the IOL optic and the three piece IOL was removed in two halves through the corneal incision.    Next, a 25 gauge trocar blade was then used to create sclerotomies without cannulas at the premarked sites 3 mm posterior to limbus and 5 mm apart from each other. The gortex suture was threaded through the eyelets of the lens and externalized through the sclera utilizing a suture hand-off technique. The lens was then folded in half and placed in the eye. The superonasal trocar was removed over the Gortex suture and the sutures were tied and the lens was centered. The knots were rotated into the sclera.  The corneal wound was sutured closed with 3x 10-0 nylon suture and the remaining paracenteses were hydrated and sealed with BSS delivered via 27g cannula.   The sclerotomies for the lens were closed with 7.0 vicryl suture.The superior temporal trocar was removed and the sclerotomy was sutured closed with 7.0 vicryl suture. The infusion trocar was removed and sutured with 7-0 vicryl. The conjunctiva was closed with 7.0 vicryl suture.  Subtenon's antibiotics and Kenalog were injected. The lid speculum was removed. Antibiotic ointment and post-operative drops were placed. The eye was doubly patched shut and a shield was taped on top. The patient was transported from the operating room by the anesthesia staff and opthalmology staff having tolerated this procedure well and suffering no intraoperative complications.

## 2019-09-12 NOTE — Transfer of Care (Signed)
Immediate Anesthesia Transfer of Care Note  Patient: Cody Phelps  Procedure(s) Performed: REMOVAL RETAINED LENS (Right Eye) PLACEMENT AND SUTURE OF SECONDARY INTRAOCULAR LENS (Right Eye) Pars Plana Vitrectomy With 25 Gauge (Right Eye)  Patient Location: PACU  Anesthesia Type:General  Level of Consciousness: oriented, drowsy and patient cooperative  Airway & Oxygen Therapy: Patient Spontanous Breathing and Patient connected to nasal cannula oxygen  Post-op Assessment: Report given to RN and Post -op Vital signs reviewed and stable  Post vital signs: Reviewed and stable  Last Vitals:  Vitals Value Taken Time  BP 146/56 09/12/19 1533  Temp    Pulse 49 09/12/19 1535  Resp 21 09/12/19 1535  SpO2 97 % 09/12/19 1535  Vitals shown include unvalidated device data.  Last Pain:  Vitals:   09/12/19 0926  TempSrc:   PainSc: 0-No pain         Complications: No apparent anesthesia complications

## 2019-09-13 ENCOUNTER — Ambulatory Visit (INDEPENDENT_AMBULATORY_CARE_PROVIDER_SITE_OTHER): Payer: Medicare Other | Admitting: Ophthalmology

## 2019-09-13 ENCOUNTER — Encounter (INDEPENDENT_AMBULATORY_CARE_PROVIDER_SITE_OTHER): Payer: Self-pay | Admitting: Ophthalmology

## 2019-09-13 DIAGNOSIS — H3581 Retinal edema: Secondary | ICD-10-CM

## 2019-09-13 DIAGNOSIS — H3321 Serous retinal detachment, right eye: Secondary | ICD-10-CM

## 2019-09-13 DIAGNOSIS — H35033 Hypertensive retinopathy, bilateral: Secondary | ICD-10-CM

## 2019-09-13 DIAGNOSIS — I10 Essential (primary) hypertension: Secondary | ICD-10-CM

## 2019-09-13 DIAGNOSIS — Z961 Presence of intraocular lens: Secondary | ICD-10-CM

## 2019-09-13 DIAGNOSIS — T8522XS Displacement of intraocular lens, sequela: Secondary | ICD-10-CM

## 2019-09-13 DIAGNOSIS — Z9889 Other specified postprocedural states: Secondary | ICD-10-CM

## 2019-09-13 DIAGNOSIS — H2701 Aphakia, right eye: Secondary | ICD-10-CM

## 2019-09-13 DIAGNOSIS — E119 Type 2 diabetes mellitus without complications: Secondary | ICD-10-CM

## 2019-09-13 DIAGNOSIS — H35411 Lattice degeneration of retina, right eye: Secondary | ICD-10-CM

## 2019-09-13 NOTE — Anesthesia Postprocedure Evaluation (Signed)
Anesthesia Post Note  Patient: JENNIE MCNAIR  Procedure(s) Performed: REMOVAL RETAINED LENS (Right Eye) PLACEMENT AND SUTURE OF SECONDARY INTRAOCULAR LENS (Right Eye) Pars Plana Vitrectomy With 25 Gauge (Right Eye)     Patient location during evaluation: PACU Anesthesia Type: General Level of consciousness: awake and alert Pain management: pain level controlled Vital Signs Assessment: post-procedure vital signs reviewed and stable Respiratory status: spontaneous breathing, nonlabored ventilation, respiratory function stable and patient connected to nasal cannula oxygen Cardiovascular status: blood pressure returned to baseline and stable Postop Assessment: no apparent nausea or vomiting Anesthetic complications: no    Last Vitals:  Vitals:   09/12/19 1629 09/12/19 1630  BP:  (!) 160/62  Pulse: (!) 51 (!) 51  Resp: 17 18  Temp:  36.6 C  SpO2: 96% 97%    Last Pain:  Vitals:   09/12/19 1630  TempSrc:   PainSc: 0-No pain                 Meilyn Heindl

## 2019-09-15 ENCOUNTER — Encounter (INDEPENDENT_AMBULATORY_CARE_PROVIDER_SITE_OTHER): Payer: Self-pay | Admitting: Ophthalmology

## 2019-09-16 ENCOUNTER — Encounter: Payer: Self-pay | Admitting: *Deleted

## 2019-09-16 ENCOUNTER — Encounter (INDEPENDENT_AMBULATORY_CARE_PROVIDER_SITE_OTHER): Payer: Medicare Other | Admitting: Ophthalmology

## 2019-09-19 ENCOUNTER — Encounter: Payer: Self-pay | Admitting: *Deleted

## 2019-09-20 ENCOUNTER — Ambulatory Visit (INDEPENDENT_AMBULATORY_CARE_PROVIDER_SITE_OTHER): Payer: Medicare Other | Admitting: Ophthalmology

## 2019-09-20 ENCOUNTER — Other Ambulatory Visit: Payer: Self-pay

## 2019-09-20 ENCOUNTER — Encounter (INDEPENDENT_AMBULATORY_CARE_PROVIDER_SITE_OTHER): Payer: Self-pay | Admitting: Ophthalmology

## 2019-09-20 ENCOUNTER — Encounter (INDEPENDENT_AMBULATORY_CARE_PROVIDER_SITE_OTHER): Payer: Medicare Other | Admitting: Ophthalmology

## 2019-09-20 DIAGNOSIS — Z9889 Other specified postprocedural states: Secondary | ICD-10-CM

## 2019-09-20 DIAGNOSIS — T8522XS Displacement of intraocular lens, sequela: Secondary | ICD-10-CM

## 2019-09-20 DIAGNOSIS — Z961 Presence of intraocular lens: Secondary | ICD-10-CM

## 2019-09-20 DIAGNOSIS — H2701 Aphakia, right eye: Secondary | ICD-10-CM

## 2019-09-20 DIAGNOSIS — H3321 Serous retinal detachment, right eye: Secondary | ICD-10-CM

## 2019-09-20 DIAGNOSIS — H35411 Lattice degeneration of retina, right eye: Secondary | ICD-10-CM

## 2019-09-20 DIAGNOSIS — I10 Essential (primary) hypertension: Secondary | ICD-10-CM

## 2019-09-20 DIAGNOSIS — E119 Type 2 diabetes mellitus without complications: Secondary | ICD-10-CM

## 2019-09-20 DIAGNOSIS — H3581 Retinal edema: Secondary | ICD-10-CM

## 2019-09-20 DIAGNOSIS — H35033 Hypertensive retinopathy, bilateral: Secondary | ICD-10-CM

## 2019-09-20 MED ORDER — BACITRACIN-POLYMYXIN B 500-10000 UNIT/GM OP OINT: TOPICAL_OINTMENT | Freq: Two times a day (BID) | OPHTHALMIC | 3 refills | Status: AC

## 2019-09-20 MED ORDER — PREDNISOLONE ACETATE 1 % OP SUSP: 1.0000 [drp] | OPHTHALMIC | 1 refills | Status: DC

## 2019-09-20 NOTE — Progress Notes (Signed)
[facility] Note  09/20/2019     CHIEF COMPLAINT Patient presents for Post-op Follow-up   HISTORY OF PRESENT ILLNESS: Cody Phelps is a 79 y.o. male who presents to the clinic today for:   HPI    Post-op Follow-up    In right eye.  Discomfort includes Negative for pain, itching, foreign body sensation, tearing, discharge, floaters and none.  Vision is stable.  I, the attending physician,  performed the HPI with the patient and updated documentation appropriately.          Comments    Pt states his vision is about the same.  Pt denies eye pain.  Patient denies any new or worsening floaters or fol OU.       Last edited by [name], MD on 09/20/2019  1:25 PM. (History)    pt is with his wife today, pt states his eye is healing okay, he states he is not in any pain, he says his vision has gone down since last visit  Referring physician: [name], MD [place],  [place] [address]  HISTORICAL INFORMATION:   Selected notes from the [facility] Referred by Dr. [name] for concern of RD OD   CURRENT MEDICATIONS: Current Outpatient Medications (Ophthalmic Drugs)  Medication Sig  . bacitracin-polymyxin b (POLYSPORIN) ophthalmic ointment Place into the right eye 2 (two) times daily for 10 days. Place a 1/2 inch ribbon of ointment into the lower eyelid.  . Bromfenac Sodium (PROLENSA) 0.07 % SOLN Place 1 drop into the right eye 4 (four) times daily.  . dorzolamide-timolol (COSOPT) 22.3-6.8 MG/ML ophthalmic solution Place 1 drop into the right eye 2 (two) times daily.  . prednisoLONE acetate (PRED FORTE) 1 % ophthalmic suspension Place 1 drop into the right eye every 2 (two) hours while awake.   No current facility-administered medications for this visit. (Ophthalmic Drugs)   Current Outpatient Medications (Other)  Medication Sig  . amiodarone (PACERONE) 200 MG tablet TAKE 1 TABLET BY MOUTH  DAILY (Patient taking  differently: Take 200 mg by mouth daily. )  . atorvastatin (LIPITOR) 80 MG tablet Take 1 tablet (80 mg total) by mouth daily at 6 PM. (Patient taking differently: Take 40 mg by mouth every evening. )  . colesevelam (WELCHOL) 625 MG tablet Take 625 mg by mouth 3 (three) times daily.   Marland Kitchen donepezil (ARICEPT) 10 MG tablet Take 10 mg by mouth every morning.   Marland Kitchen esomeprazole (NEXIUM) 40 MG capsule Take 40 mg by mouth daily.   . ferrous sulfate 325 (65 FE) MG tablet Take 325 mg by mouth 3 (three) times daily with meals.  . gabapentin (NEURONTIN) 300 MG capsule Take 300 mg by mouth 2 (two) times daily.   . insulin glargine (LANTUS SOLOSTAR) 100 UNIT/ML injection Inject 20-40 Units into the skin at bedtime as needed (BGL below 140-150 take 20 units, if 151 or higher take 40 units).   . memantine (NAMENDA) 5 MG tablet Take 5 mg by mouth 2 (two) times daily.   . metoprolol tartrate (LOPRESSOR) 25 MG tablet TAKE 1 TABLET BY MOUTH  DAILY (Patient taking differently: Take 12.5 mg by mouth 2 (two) times a day. )  . mirabegron ER (MYRBETRIQ) 50 MG TB24 tablet Take 50 mg by mouth at bedtime.   Marland Kitchen omega-3 acid ethyl esters (LOVAZA) 1 G capsule Take 2 g by mouth daily.   Marland Kitchen rOPINIRole (REQUIP) 2 MG tablet Take 2 mg  by mouth 3 (three) times daily as needed (RLS).   Marland Kitchen spironolactone (ALDACTONE) 25 MG tablet TAKE ONE-HALF TABLET BY  MOUTH DAILY (Patient taking differently: Take 12.5 mg by mouth daily. )  . tamsulosin (FLOMAX) 0.4 MG CAPS capsule Take 0.4 mg by mouth daily after supper.   . valsartan-hydrochlorothiazide (DIOVAN-HCT) 80-12.5 MG tablet TAKE 1 TABLET BY MOUTH IN  THE MORNING (Patient taking differently: Take 1 tablet by mouth every morning. )  . vitamin B-12 (CYANOCOBALAMIN) 1000 MCG tablet Take 1,000 mcg by mouth 2 (two) times daily.  Alveda Reasons 15 MG TABS tablet TAKE 1 TABLET BY MOUTH  EVERY EVENING AFTER DINNER (Patient taking differently: Take 15 mg by mouth daily with supper. )   No current  facility-administered medications for this visit. (Other)      REVIEW OF SYSTEMS: ROS    Positive for: Endocrine, Cardiovascular, Eyes, Respiratory   Negative for: Constitutional, Gastrointestinal, Neurological, Skin, Genitourinary, Musculoskeletal, HENT, Psychiatric, Allergic/Imm, Heme/Lymph   Last edited by Doneen Poisson on 09/20/2019  1:07 PM. (History)       ALLERGIES No Known Allergies  PAST MEDICAL HISTORY Past Medical History:  Diagnosis Date  . Acute anterior wall MI ([place]) 03/01/2018  . Acute combined systolic and diastolic heart failure ([place]) 03/15/2018  . Bronchitis, acute 05/04/2012  . Burn   . Carotid stenosis, left followed by dr [name], cardiologist--- bilateral bruit per note   per dr [name] note [id number]  LICA 41-93% stenosis per duplex 05-14-2014  . Cataracts, bilateral    surgery to remove cataracts  . Chronic diastolic CHF (congestive heart failure) ([place]) 09/21/2018  . Dementia ([place])   . Diverticulosis   . DM (diabetes mellitus) type 2, uncontrolled, with ketoacidosis ([place]) 11/25/2013  . ED (erectile dysfunction)   . First degree heart block   . GERD (gastroesophageal reflux disease)   . Heart murmur   . History of blood transfusion    with open heart surgery  . Hypertension    cardiologsit-  dr [name]  . Iron deficiency anemia   . Mixed hyperlipidemia   . Morbid obesity ([place])   . Myocardial infarction ([place])    02/28/18  . OA (osteoarthritis)    right hip  . OSA on CPAP    followed by dr dohmeier, uses CPAP  . PAF (paroxysmal atrial fibrillation) ([name])    a. on Eliquis  . Prostate cancer ([place])    dx 03-17-2017 (bx)  Stage T2a, Gleason 4+4, PSA  4.43, vol 32.32--  s/p radiatctive prostate seed implants 07-10-2017 then IMRT and ADT  . RLS (restless legs syndrome)   . S/P aortic valve replacement with bioprosthetic valve 05/30/2018   23 mm Edwards Inspiris Resilia stented bovine pericardial tissue valve  . 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 S/P Maze operation for atrial fibrillation 05/30/2018   Complete bilateral atrial lesion set using bipolar radiofrequency and cryothermy ablation with clipping of LA appendage  . Scoliosis   . Severe aortic stenosis   . Trigger finger   . Type 2 diabetes mellitus ([place])   . Wears partial dentures    upper and lower   Past Surgical History:  Procedure Laterality Date  . AORTIC VALVE REPLACEMENT N/A 05/30/2018   Procedure: AORTIC VALVE REPLACEMENT (AVR) using Inspiris Valve, Size 23;  Surgeon: [name], MD;  Location: [facility];  Service: Open Heart Surgery;  Laterality: N/A;  . APPENDECTOMY  1988  . BILATERAL TOTAL  ETHMOIDECTOMY AND SPHENOIDECTOMY  01-14-2009    dr Redmond Baseman   [facility]  . CARDIOVERSION N/A 08/10/2018   Procedure: CARDIOVERSION;  Surgeon: Adrian Prows, MD;  Location: [place];  Service: Cardiovascular;  Laterality: N/A;  . CARPAL TUNNEL RELEASE Bilateral 2013  . CATARACT EXTRACTION W/ INTRAOCULAR LENS  IMPLANT, BILATERAL  date?  . CORONARY ARTERY BYPASS GRAFT N/A 05/30/2018   Procedure: CORONARY ARTERY BYPASS GRAFTING (CABG) x one using left internal mammary artery;  Surgeon: [name], MD;  Location: [facility];  Service: Open Heart Surgery;  Laterality: N/A;  . CORONARY BALLOON ANGIOPLASTY N/A 03/01/2018   Procedure: CORONARY BALLOON ANGIOPLASTY;  Surgeon: Adrian Prows, MD;  Location: [facility] [facility];  Service: Cardiovascular;  Laterality: N/A;  . CORONARY/GRAFT ACUTE MI REVASCULARIZATION N/A 03/01/2018   Procedure: Coronary/Graft Acute MI Revascularization;  Surgeon: Adrian Prows, MD;  Location: [place] [facility];  Service: Cardiovascular;  Laterality: N/A;  . CYSTOSCOPY  07/19/2017   Procedure: CYSTOSCOPY;  Surgeon: Nickie Retort, MD;  Location: [facility];  Service: Urology;;  No seeds found in [place]  . GAS/FLUID EXCHANGE Right 06/13/2019   Procedure: GAS/FLUID EXCHANGE;  Surgeon: [name], MD;  Location: [place];  Service: Ophthalmology;   Laterality: Right;  . LAMINECTOMY WITH POSTERIOR LATERAL ARTHRODESIS LEVEL 2 N/A 02/12/2014   Procedure: LUMBAR TWO-THREE,LUIMBAR THREE-FOUR LAMINECTOMY/FORAMINOTOMY;POSSIBLE POSTEROLATERAL ARTHRODESIS WITH AUTOGRAFT;  Surgeon: [name], MD;  Location: [facility];  Service: Neurosurgery;  Laterality: N/A;  . LEFT HEART CATH AND CORONARY ANGIOGRAPHY N/A 03/01/2018   Procedure: LEFT HEART CATH AND CORONARY ANGIOGRAPHY;  Surgeon: Adrian Prows, MD;  Location: [place] [facility];  Service: Cardiovascular;  Laterality: N/A;  . MAZE N/A 05/30/2018   Procedure: MAZE;  Surgeon: [name], MD;  Location: [place];  Service: Open Heart Surgery;  Laterality: N/A;  . ORIF RIGHT ANKLE FX'S  05/05/2001   retained hardware  . PARS PLANA VITRECTOMY Right 06/13/2019   Procedure: PARS PLANA VITRECTOMY WITH 25 GAUGE WITH LASER AND GAS;  Surgeon: [name], MD;  Location: [facility];  Service: Ophthalmology;  Laterality: Right;  . PARS PLANA VITRECTOMY Right 09/12/2019   Procedure: Pars Plana Vitrectomy With 25 Gauge;  Surgeon: [name], MD;  Location: [place];  Service: Ophthalmology;  Laterality: Right;  . [place] INJECTION Right 06/13/2019   Procedure: PERFLUORONE INJECTION;  Surgeon: [name], MD;  Location: [place];  Service: Ophthalmology;  Laterality: Right;  . PHOTOCOAGULATION WITH LASER Right 06/13/2019   Procedure: PHOTOCOAGULATION WITH LASER;  Surgeon: [name], MD;  Location: [facility];  Service: Ophthalmology;  Laterality: Right;  . PLACEMENT AND SUTURE OF SECONDARY INTRAOCULAR LENS Right 09/12/2019   Procedure: PLACEMENT AND SUTURE OF SECONDARY INTRAOCULAR LENS;  Surgeon: [name], MD;  Location: [place];  Service: Ophthalmology;  Laterality: Right;  . PROSTATE BIOPSY  03-17-2017   dr Pilar Jarvis office  . RADIOACTIVE SEED IMPLANT N/A 07/19/2017   Procedure: RADIOACTIVE SEED IMPLANT/BRACHYTHERAPY IMPLANT;  Surgeon: Nickie Retort, MD;  [facility];   Service: Urology;  Laterality: N/A;  69 seeds implanted  . REMOVAL RETAINED LENS Right 09/12/2019   Procedure: REMOVAL RETAINED LENS;  Surgeon: [name], MD;  Location: [place];  Service: Ophthalmology;  Laterality: Right;  . RETINAL DETACHMENT SURGERY Right 06/13/2019   Dr. Coralyn Pear  . SKIN GRAFT     face  . SPACE OAR INSTILLATION N/A 07/19/2017   Procedure: SPACE OAR INSTILLATION;  Surgeon: Nickie Retort,  MD;  Location: [facility];  Service: Urology;  Laterality: N/A;  . TEE WITHOUT CARDIOVERSION  03/20/2012   Procedure: TRANSESOPHAGEAL ECHOCARDIOGRAM (TEE);  Surgeon: Laverda Page, MD;  Location[facility];  Service: Cardiovascular;  Laterality: N/A;  normal LV; normal EF; normal RV; normal LA w/ left atrial appendage very small, normal function, interatrial septum intact without defect; normal RA; trace MR,TR, & PI; mild AV calcification and senile degeneration w/ mild stenosis, AVA 1.7cm^2;;   . TEE WITHOUT CARDIOVERSION N/A 05/30/2018   Procedure: TRANSESOPHAGEAL ECHOCARDIOGRAM (TEE);  Surgeon: [name], MD;  Location: [place];  Service: Open Heart Surgery;  Laterality: N/A;  . TOTAL HIP ARTHROPLASTY Right 10/23/2017   Procedure: TOTAL HIP ARTHROPLASTY ANTERIOR APPROACH;  Surgeon: Frederik Pear, MD;  Location: [place];  Service: Orthopedics;  Laterality: Right;  . TRANSTHORACIC ECHOCARDIOGRAM  01-11-2017   dr [name]  (per echo note, no significant change in seveity of AS, no other diagnostic change)   moderate concentric LVH, ef 67%, grade 1 diastolic dysfunction/  moderate LAE/  mild , grade 1 AR w/ moderate AV calcification, mild to moderate restricted AV leaflets w/ moderate AS, AVA 1.16cm^2, peak grandient 23mHg, mean grandient 353mg/  trace MR, mild calcification MV annulus , mild MV leaflet calcification, mild MVS, peak grandient 4.30m7m, mean grandient 2.7mm730m trace TR    FAMILY HISTORY Family History  Problem Relation Age of Onset  . Stroke Mother    . Hypertension Mother   . Heart attack Father   . Heart murmur Father   . Hypertension Sister   . Colon cancer Neg Hx   . Stomach cancer Neg Hx   . Rectal cancer Neg Hx   . Pancreatic cancer Neg Hx     SOCIAL HISTORY Social History   Tobacco Use  . Smoking status: Former Smoker    Years: 1.00    Types: Cigarettes    Quit date: 02/04/1967    Years since quitting: 52.6  . Smokeless tobacco: Never Used  . Tobacco comment: smoked 1 q2-3 days  Substance Use Topics  . Alcohol use: Yes    Comment: occasional beer  . Drug use: No         OPHTHALMIC EXAM:  Base Eye Exam    Visual Acuity (Snellen - Linear)      Right Left   Dist [place] CF @ 3' 20/25 -2   Dist ph [place] 20/400 20/20 -1       Tonometry (Tonopen, 1:10 PM)      Right Left   Pressure 07 Def       Pupils      Dark Light Shape React APD   Right 6 6 Round NR 0   Left 2 1 Round Minimal 0       Extraocular Movement      Right Left    Full Full       Neuro/Psych    Oriented x3: Yes   Mood/Affect: Normal       Dilation    Right eye: 1.0% Mydriacyl, 2.5% Phenylephrine @ 1:10 PM        Slit Lamp and Fundus Exam    External Exam      Right Left   External Periorbital edema        Slit Lamp Exam      Right Left   Lids/Lashes mild Meibomian gland dysfunction Dermatochalasis - upper lid, Meibomian gland dysfunction   Conjunctiva/Sclera Subconjunctival hemorrhage improving, sutures intact Temporal  Pinguecula   Cornea No Epi defect, 2-3+ Descemet's folds, 3 nylon sutures at 12, closed corneal wound, 3-4+ Punctate epithelial erosions 1+ Punctate epithelial erosions, +verticellata, trace endo pigment, Debris in tear film   Anterior Chamber Deep, 1-2+pigment Deep and quiet   Iris Round and poorly dilated to 50m, scattered Transillumination defects 360, +iridodonesis  Round and dilated, scattered, 360 Transillumination defects   Lens Sutured Akreos IOL well centered  3 piece Posterior chamber intraocular in good  position, trace Posterior capsular opacification   Vitreous post vitrectomy, trace pigment Vitreous syneresis, Posterior vitreous detachment       Fundus Exam      Right Left   Disc 1+Pallor, Sharp rim Mild Pallor, Sharp rim, temporal PPP, mild tilt   C/D Ratio 0.3 0.4   Macula Hazy view, grossly Flat, mild ERM superiorly, Blunted foveal reflex, RPE mottling and clumping Flat, Blunted foveal reflex, Retinal pigment epithelial mottling and clumping, No heme or edema   Vessels Vascular attenuation, AV crossing changes, Tortuous Vascular attenuation, Tortuous   Periphery attached, good 360 laser in place; ORIGINALLY: bullous superior retinal detachment from 1000-0130, another more shallow detachment lobe from 130-400, lattice and micro tears ST quadrant; Old retinal tear at 0900 with surrounding laser, Attached, peripheral laser scars at 1030           IMAGING AND PROCEDURES  Imaging and Procedures for _0 @  OCT, Retina - OU - Both Eyes       Right Eye Quality was poor. Central Foveal Thickness: 253. Progression has improved. Findings include intraretinal fluid, normal foveal contour, no SRF (Retina re-attached; interval improvement in IRF).   Left Eye Quality was good. Central Foveal Thickness: 263. Progression has been stable. Findings include normal foveal contour, no IRF, no SRF.   Notes *Images captured and stored on drive  Diagnosis / Impression:  OD: Retina re-attached; interval improvement in IRF OS: NFP, no IRF/SRF   Clinical management:  See below  Abbreviations: NFP - Normal foveal profile. CME - cystoid macular edema. PED - pigment epithelial detachment. IRF - intraretinal fluid. SRF - subretinal fluid. EZ - ellipsoid zone. ERM - epiretinal membrane. ORA - outer retinal atrophy. ORT - outer retinal tubulation. SRHM - subretinal hyper-reflective material                 ASSESSMENT/PLAN:    ICD-10-CM   1. Dislocation of intraocular lens, sequela   T85.22XS   2. Aphakia of right eye  H27.01   3. Right retinal detachment  H33.21   4. 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 Retinal edema  H35.81 OCT, Retina - OU - Both Eyes  6. History of repair of retinal tear by laser photocoagulation  Z98.890   7. Diabetes mellitus type 2 without retinopathy (HR[place]pine  E11.9   8. Essential hypertension  I10   9. Hypertensive retinopathy of both eyes  H35.033   10. Pseudophakia of both eyes  Z96.1    1,2. Dislocated IOL OD  - pt with history of partially dislocated 3-piece IOL -- spontaneously dislocated completely into vitreous cavity on 1.23.21  - s/p 25g PPV w/ IOL explantation and secondary sutured IOL OD -- Akreos AO60 lens, 17.5D -- 02.11.2021             - IOL in good position  - corneal epi defect closed but irregular epi, +PEE, and corneal edema             - IOP okay at  07             - cont   PF 4x/day OD -- increase to every 2 hours                         zymaxid QID OD                         Brimonidine QD OD -- okay to stop                         PSO ung QID OD              - eye shield when sleeping              - post op drop and positioning instructions reviewed              - tylenol/ibuprofen for pain              - f/u 1 week for K check   3-5. Rhegmatogenous retinal detachment, right eye  - bullous, superior, mac off detachment, onset of foveal involvement 11.11.20 by history  - Bi-lobed superior detachment, superior lobe spanning 1000-0130, nasal lobe 0130-0400  - lattice degeneration with microtears noted at 1030  - s/p PPV/PFC/EL/FAX/14% C3F8 OD, 11.12.20             - intraop: HST at 2 oclock was found; also detachment had progressed to subtotal detachment spanning 9 oclock to 6 oclock (going in clockwise direction); also severe zonular insufficiency with IOL very mobile throughout case -- did not dislocate completely during the case or immediately post op  - doing well              - retina attached and in  good position -- good laser in place  6. History of retinal defects s/p laser retinopexy OU with Dr. Zigmund Daniel in 2013  - OD laser at 0900  - OS laser at 1030  - stable  7. Diabetes mellitus, type 2 without retinopathy  - The incidence, risk factors for progression, natural history and treatment options for diabetic retinopathy  were discussed with patient.    - The need for close monitoring of blood glucose, blood pressure, and serum lipids, avoiding cigarette or any type of tobacco, and the need for long term follow up was also discussed with patient  8,9. Hypertensive retinopathy OU  - discussed importance of tight BP control  - monitor  10. Pseudophakia OU  - s/p CE/IOL OU (Dr. [name])  - 3 piece IOL OD was completely displaced into vitreous -- now with sutured Akreos IOL in good position -- see above  - OS 3-piece IOL in good position  - monitor   Ophthalmic Meds Ordered this visit:  Meds ordered this encounter  Medications  . prednisoLONE acetate (PRED FORTE) 1 % ophthalmic suspension    Sig: Place 1 drop into the right eye every 2 (two) hours while awake.    Dispense:  15 mL    Refill:  1  . bacitracin-polymyxin b (POLYSPORIN) ophthalmic ointment    Sig: Place into the right eye 2 (two) times daily for 10 days. Place a 1/2 inch ribbon of ointment into the lower eyelid.    Dispense:  3.5 g    Refill:  3       Return in about 1 week (around 09/27/2019) for f/u  dislocated IOL OD, DFE, OCT.  There are no Patient Instructions on file for this visit.   Explained the diagnoses, plan, and follow up with the patient and they expressed understanding.  Patient expressed understanding of the importance of proper follow up care.  This document serves as a record of services personally performed by Gardiner Sleeper, MD, PhD. It was created on their behalf by Ernest Mallick, OA, an ophthalmic assistant. The creation of this record is the provider's dictation and/or activities during  the visit.    Electronically signed by: Ernest Mallick, OA 02.19.2021 2:43 PM   Gardiner Sleeper, M.D., Ph.D. Diseases & Surgery of the Retina and Vitreous [facility]  I have reviewed the above documentation for accuracy and completeness, and I agree with the above. Gardiner Sleeper, M.D., Ph.D. 09/22/19 11:36 AM    Abbreviations: M myopia (nearsighted); A astigmatism; H hyperopia (farsighted); P presbyopia; Mrx spectacle prescription;  CTL contact lenses; OD right eye; OS left eye; OU both eyes  XT exotropia; ET esotropia; PEK punctate epithelial keratitis; PEE punctate epithelial erosions; DES dry eye syndrome; MGD meibomian gland dysfunction; ATs artificial tears; PFAT's preservative free artificial tears; [place] nuclear sclerotic cataract; PSC posterior subcapsular cataract; ERM epi-retinal membrane; PVD posterior vitreous detachment; RD retinal detachment; DM diabetes mellitus; DR diabetic retinopathy; NPDR non-proliferative diabetic retinopathy; PDR proliferative diabetic retinopathy; CSME clinically significant macular edema; DME diabetic macular edema; dbh dot blot hemorrhages; CWS cotton wool spot; POAG primary open angle glaucoma; C/D cup-to-disc ratio; HVF humphrey visual field; GVF goldmann visual field; OCT optical coherence tomography; IOP intraocular pressure; BRVO Branch retinal vein occlusion; CRVO central retinal vein occlusion; CRAO central retinal artery occlusion; BRAO branch retinal artery occlusion; RT retinal tear; SB scleral buckle; PPV pars plana vitrectomy; VH Vitreous hemorrhage; PRP panretinal laser photocoagulation; IVK intravitreal kenalog; VMT vitreomacular traction; MH Macular hole;  NVD neovascularization of the disc; NVE neovascularization elsewhere; AREDS age related eye disease study; ARMD age related macular degeneration; POAG primary open angle glaucoma; EBMD epithelial/anterior basement membrane dystrophy; ACIOL anterior chamber intraocular lens;  IOL intraocular lens; PCIOL posterior chamber intraocular lens; Phaco/IOL phacoemulsification with intraocular lens placement; [place] photorefractive keratectomy; LASIK laser assisted in situ keratomileusis; HTN hypertension; DM diabetes mellitus; COPD chronic obstructive pulmonary disease

## 2019-09-26 NOTE — Progress Notes (Signed)
[facility] Note  09/27/2019     CHIEF COMPLAINT Patient presents for Post-op Follow-up   HISTORY OF PRESENT ILLNESS: Cody Phelps is a 79 y.o. male who presents to the clinic today for:   HPI    Post-op Follow-up    In right eye.  Vision is stable.  I, the attending physician,  performed the HPI with the patient and updated documentation appropriately.          Comments    Pt states vision is the same.  Patient denies eye pain.  Patient denies any new or worsening floaters or fol OU.       Last edited by [name], MD on 09/27/2019  3:11 PM. (History)    pt states he does not feel like his vision is getting any better   Referring physician: [name], MD [place],  [place] [id number]  HISTORICAL INFORMATION:   Selected notes from the [facility] Referred by Dr. [name] for concern of RD OD   CURRENT MEDICATIONS: Current Outpatient Medications (Ophthalmic Drugs)  Medication Sig  . bacitracin-polymyxin b (POLYSPORIN) ophthalmic ointment Place into the right eye 2 (two) times daily for 10 days. Place a 1/2 inch ribbon of ointment into the lower eyelid.  . Bromfenac Sodium (PROLENSA) 0.07 % SOLN Place 1 drop into the right eye 4 (four) times daily.  . dorzolamide-timolol (COSOPT) 22.3-6.8 MG/ML ophthalmic solution Place 1 drop into the right eye 2 (two) times daily.  . prednisoLONE acetate (PRED FORTE) 1 % ophthalmic suspension Place 1 drop into the right eye every 2 (two) hours while awake.   No current facility-administered medications for this visit. (Ophthalmic Drugs)   Current Outpatient Medications (Other)  Medication Sig  . amiodarone (PACERONE) 200 MG tablet TAKE 1 TABLET BY MOUTH  DAILY (Patient taking differently: Take 200 mg by mouth daily. )  . atorvastatin (LIPITOR) 80 MG tablet Take 1 tablet (80 mg total) by mouth daily at 6 PM. (Patient taking differently: Take 40 mg by mouth every evening. )   . colesevelam (WELCHOL) 625 MG tablet Take 625 mg by mouth 3 (three) times daily.   Marland Kitchen donepezil (ARICEPT) 10 MG tablet Take 10 mg by mouth every morning.   Marland Kitchen esomeprazole (NEXIUM) 40 MG capsule Take 40 mg by mouth daily.   . ferrous sulfate 325 (65 FE) MG tablet Take 325 mg by mouth 3 (three) times daily with meals.  . gabapentin (NEURONTIN) 300 MG capsule Take 300 mg by mouth 2 (two) times daily.   . insulin glargine (LANTUS SOLOSTAR) 100 UNIT/ML injection Inject 20-40 Units into the skin at bedtime as needed (BGL below 140-150 take 20 units, if 151 or higher take 40 units).   . memantine (NAMENDA) 5 MG tablet Take 5 mg by mouth 2 (two) times daily.   . metoprolol tartrate (LOPRESSOR) 25 MG tablet TAKE 1 TABLET BY MOUTH  DAILY (Patient taking differently: Take 12.5 mg by mouth 2 (two) times a day. )  . mirabegron ER (MYRBETRIQ) 50 MG TB24 tablet Take 50 mg by mouth at bedtime.   Marland Kitchen omega-3 acid ethyl esters (LOVAZA) 1 G capsule Take 2 g by mouth daily.   Marland Kitchen rOPINIRole (REQUIP) 2 MG tablet Take 2 mg by mouth 3 (three) times daily as needed (RLS).   Marland Kitchen spironolactone (ALDACTONE) 25 MG tablet TAKE ONE-HALF TABLET BY  MOUTH DAILY (Patient taking differently: Take 12.5 mg by mouth daily. )  .  tamsulosin (FLOMAX) 0.4 MG CAPS capsule Take 0.4 mg by mouth daily after supper.   . valsartan-hydrochlorothiazide (DIOVAN-HCT) 80-12.5 MG tablet TAKE 1 TABLET BY MOUTH IN  THE MORNING (Patient taking differently: Take 1 tablet by mouth every morning. )  . vitamin B-12 (CYANOCOBALAMIN) 1000 MCG tablet Take 1,000 mcg by mouth 2 (two) times daily.  Alveda Reasons 15 MG TABS tablet TAKE 1 TABLET BY MOUTH  EVERY EVENING AFTER DINNER (Patient taking differently: Take 15 mg by mouth daily with supper. )   No current facility-administered medications for this visit. (Other)      REVIEW OF SYSTEMS: ROS    Positive for: Endocrine, Cardiovascular, Eyes, Respiratory   Negative for: Constitutional, Gastrointestinal,  Neurological, Skin, Genitourinary, Musculoskeletal, HENT, Psychiatric, Allergic/Imm, Heme/Lymph   Last edited by Doneen Poisson on 09/27/2019  2:36 PM. (History)       ALLERGIES No Known Allergies  PAST MEDICAL HISTORY Past Medical History:  Diagnosis Date  . Acute anterior wall MI ([place]) 03/01/2018  . Acute combined systolic and diastolic heart failure ([place]) 03/15/2018  . Bronchitis, acute 05/04/2012  . Burn   . Carotid stenosis, left followed by dr [name], cardiologist--- bilateral bruit per note   per dr [name] note 76/2831  LICA 51-76% stenosis per duplex 05-14-2014  . Cataracts, bilateral    surgery to remove cataracts  . Chronic diastolic CHF (congestive heart failure) ([place]) 09/21/2018  . Dementia ([facility] [facility])   . Diverticulosis   . DM (diabetes mellitus) type 2, uncontrolled, with ketoacidosis ([place]) 11/25/2013  . ED (erectile dysfunction)   . First degree heart block   . GERD (gastroesophageal reflux disease)   . Heart murmur   . History of blood transfusion    with open heart surgery  . Hypertension    cardiologsit-  dr [name]  . Iron deficiency anemia   . Mixed hyperlipidemia   . Morbid obesity ([place])   . Myocardial infarction ([place] [facility])    02/28/18  . OA (osteoarthritis)    right hip  . OSA on CPAP    followed by dr dohmeier, uses CPAP  . PAF (paroxysmal atrial fibrillation) ([name])    a. on Eliquis  . Prostate cancer ([place])    dx 03-17-2017 (bx)  Stage T2a, Gleason 4+4, PSA  4.43, vol 32.32--  s/p radiatctive prostate seed implants 07-10-2017 then IMRT and ADT  . RLS (restless legs syndrome)   . S/P aortic valve replacement with bioprosthetic valve 05/30/2018   23 mm Edwards Inspiris Resilia stented bovine pericardial tissue valve  . 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 S/P Maze operation for atrial fibrillation 05/30/2018   Complete bilateral atrial lesion set using bipolar radiofrequency and cryothermy ablation with clipping of LA appendage  . Scoliosis   .  Severe aortic stenosis   . Trigger finger   . Type 2 diabetes mellitus ([place])   . Wears partial dentures    upper and lower   Past Surgical History:  Procedure Laterality Date  . AORTIC VALVE REPLACEMENT N/A 05/30/2018   Procedure: AORTIC VALVE REPLACEMENT (AVR) using Inspiris Valve, Size 23;  Surgeon: [name], MD;  Location: [facility];  Service: Open Heart Surgery;  Laterality: N/A;  . APPENDECTOMY  1988  . BILATERAL TOTAL ETHMOIDECTOMY AND SPHENOIDECTOMY  01-14-2009    dr bates   [facility]  . CARDIOVERSION N/A 08/10/2018   Procedure: CARDIOVERSION;  Surgeon: Adrian Prows, MD;  Location: [facility];  Service: Cardiovascular;  Laterality:  N/A;  . CARPAL TUNNEL RELEASE Bilateral 2013  . CATARACT EXTRACTION W/ INTRAOCULAR LENS  IMPLANT, BILATERAL  date?  . CORONARY ARTERY BYPASS GRAFT N/A 05/30/2018   Procedure: CORONARY ARTERY BYPASS GRAFTING (CABG) x one using left internal mammary artery;  Surgeon: [name], MD;  Location: [facility];  Service: Open Heart Surgery;  Laterality: N/A;  . CORONARY BALLOON ANGIOPLASTY N/A 03/01/2018   Procedure: CORONARY BALLOON ANGIOPLASTY;  Surgeon: Adrian Prows, MD;  Location: [facility];  Service: Cardiovascular;  Laterality: N/A;  . CORONARY/GRAFT ACUTE MI REVASCULARIZATION N/A 03/01/2018   Procedure: Coronary/Graft Acute MI Revascularization;  Surgeon: Adrian Prows, MD;  Location: [facility];  Service: Cardiovascular;  Laterality: N/A;  . CYSTOSCOPY  07/19/2017   Procedure: CYSTOSCOPY;  Surgeon: Nickie Retort, MD;  Location: [facility];  Service: Urology;;  No seeds found in [place]  . GAS/FLUID EXCHANGE Right 06/13/2019   Procedure: GAS/FLUID EXCHANGE;  Surgeon: [name], MD;  Location: [facility];  Service: Ophthalmology;  Laterality: Right;  . LAMINECTOMY WITH POSTERIOR LATERAL ARTHRODESIS LEVEL 2 N/A 02/12/2014   Procedure: LUMBAR TWO-THREE,LUIMBAR THREE-FOUR LAMINECTOMY/FORAMINOTOMY;POSSIBLE POSTEROLATERAL ARTHRODESIS  WITH AUTOGRAFT;  Surgeon: [name], MD;  Location: [facility];  Service: Neurosurgery;  Laterality: N/A;  . LEFT HEART CATH AND CORONARY ANGIOGRAPHY N/A 03/01/2018   Procedure: LEFT HEART CATH AND CORONARY ANGIOGRAPHY;  Surgeon: Adrian Prows, MD;  Location: [facility];  Service: Cardiovascular;  Laterality: N/A;  . MAZE N/A 05/30/2018   Procedure: MAZE;  Surgeon: [name], MD;  Location: [facility];  Service: Open Heart Surgery;  Laterality: N/A;  . ORIF RIGHT ANKLE FX'S  05/05/2001   retained hardware  . PARS PLANA VITRECTOMY Right 06/13/2019   Procedure: PARS PLANA VITRECTOMY WITH 25 GAUGE WITH LASER AND GAS;  Surgeon: [name], MD;  Location: [facility];  Service: Ophthalmology;  Laterality: Right;  . PARS PLANA VITRECTOMY Right 09/12/2019   Procedure: [place] Vitrectomy With 25 Gauge;  Surgeon: [name], MD;  Location: [facility];  Service: Ophthalmology;  Laterality: Right;  . [place] INJECTION Right 06/13/2019   Procedure: PERFLUORONE INJECTION;  Surgeon: [name], MD;  Location: [facility];  Service: Ophthalmology;  Laterality: Right;  . PHOTOCOAGULATION WITH LASER Right 06/13/2019   Procedure: PHOTOCOAGULATION WITH LASER;  Surgeon: [name], MD;  Location: [facility];  Service: Ophthalmology;  Laterality: Right;  . PLACEMENT AND SUTURE OF SECONDARY INTRAOCULAR LENS Right 09/12/2019   Procedure: PLACEMENT AND SUTURE OF SECONDARY INTRAOCULAR LENS;  Surgeon: [name], MD;  Location: [facility];  Service: Ophthalmology;  Laterality: Right;  . PROSTATE BIOPSY  03-17-2017   dr Pilar Jarvis office  . RADIOACTIVE SEED IMPLANT N/A 07/19/2017   Procedure: RADIOACTIVE SEED IMPLANT/BRACHYTHERAPY IMPLANT;  Surgeon: Nickie Retort, MD;  Location[facility];  Service: Urology;  Laterality: N/A;  69 seeds implanted  . REMOVAL RETAINED LENS Right 09/12/2019   Procedure: REMOVAL RETAINED LENS;  Surgeon: [name], MD;  Location: [facility];  Service: Ophthalmology;   Laterality: Right;  . RETINAL DETACHMENT SURGERY Right 06/13/2019   Dr. Coralyn Pear  . SKIN GRAFT     face  . SPACE OAR INSTILLATION N/A 07/19/2017   Procedure: SPACE OAR INSTILLATION;  Surgeon: Nickie Retort, MD;  Location[facility];  Service: Urology;  Laterality: N/A;  . TEE WITHOUT CARDIOVERSION  03/20/2012   Procedure: TRANSESOPHAGEAL ECHOCARDIOGRAM (TEE);  Surgeon: Laverda Page, MD;  Location: [facility];  Service: Cardiovascular;  Laterality: N/A;  normal LV; normal EF; normal RV; normal LA w/ left atrial appendage very small, normal function, interatrial septum intact without defect; normal RA; trace MR,TR, & PI; mild AV calcification and senile degeneration w/ mild stenosis, AVA 1.7cm^2;;   . TEE WITHOUT CARDIOVERSION N/A 05/30/2018   Procedure: TRANSESOPHAGEAL ECHOCARDIOGRAM (TEE);  Surgeon: [name], MD;  Location: [facility];  Service: Open Heart Surgery;  Laterality: N/A;  . TOTAL HIP ARTHROPLASTY Right 10/23/2017   Procedure: TOTAL HIP ARTHROPLASTY ANTERIOR APPROACH;  Surgeon: Frederik Pear, MD;  Location: [facility];  Service: Orthopedics;  Laterality: Right;  . TRANSTHORACIC ECHOCARDIOGRAM  01-11-2017   dr [name]  (per echo note, no significant change in seveity of AS, no other diagnostic change)   moderate concentric LVH, ef 29%, grade 1 diastolic dysfunction/  moderate LAE/  mild , grade 1 AR w/ moderate AV calcification, mild to moderate restricted AV leaflets w/ moderate AS, AVA 1.16cm^2, peak grandient 89mHg, mean grandient 329mg/  trace MR, mild calcification MV annulus , mild MV leaflet calcification, mild MVS, peak grandient 4.71m101m, mean grandient 2.7mm79m trace TR    FAMILY HISTORY Family History  Problem Relation Age of Onset  . Stroke Mother   . Hypertension Mother   . Heart attack Father   . Heart murmur Father   . Hypertension Sister   . Colon cancer Neg Hx   . Stomach cancer Neg Hx   . Rectal cancer Neg Hx   . Pancreatic cancer Neg Hx      SOCIAL HISTORY Social History   Tobacco Use  . Smoking status: Former Smoker    Years: 1.00    Types: Cigarettes    Quit date: 02/04/1967    Years since quitting: 52.6  . Smokeless tobacco: Never Used  . Tobacco comment: smoked 1 q2-3 days  Substance Use Topics  . Alcohol use: Yes    Comment: occasional beer  . Drug use: No         OPHTHALMIC EXAM:  Base Eye Exam    Visual Acuity (Snellen - Linear)      Right Left   Dist [place] 20/250 -1 20/25   Dist ph [place] 20/80 -2        Tonometry (Tonopen, 2:41 PM)      Right Left   Pressure 18 11       Pupils      Dark Light Shape React APD   Right 5 5 Round dilated 0   Left 2 1 Round Brisk 0       Visual Fields      Left Right    Full Full       Extraocular Movement      Right Left    Full Full       Neuro/Psych    Oriented x3: Yes   Mood/Affect: Normal       Dilation    Both eyes: 1.0% Mydriacyl, 2.5% Phenylephrine @ 2:41 PM        Slit Lamp and Fundus Exam    External Exam      Right Left   External Periorbital edema        Slit Lamp Exam      Right Left   Lids/Lashes mild Meibomian gland dysfunction, mild Telangiectasia Dermatochalasis - upper lid, Meibomian gland dysfunction   Conjunctiva/Sclera Trace injection, sutures intact, Subconjunctival hemorrhage improving Temporal Pinguecula   Cornea edema improving--no Descemet's folds centrally, 3 nylon sutures at 12--closed corneal  wound with trace descemet folds surrounding, 2-3+ Punctate epithelial erosions, +ointment 1+ Punctate epithelial erosions, +verticellata, trace endo pigment, Debris in tear film   Anterior Chamber Deep, 1-2+pigment Deep and quiet   Iris Round and poorly dilated to 44m, scattered Transillumination defects 360, +iridodonesis  Round and dilated, scattered, 360 Transillumination defects   Lens Sutured Akreos IOL well centered  3 piece Posterior chamber intraocular in good position, trace Posterior capsular opacification   Vitreous  post vitrectomy, trace pigment Vitreous syneresis, Posterior vitreous detachment       Fundus Exam      Right Left   Disc 1+Pallor, Sharp rim Mild Pallor, Sharp rim, temporal PPP, mild tilt   C/D Ratio 0.3 0.4   Macula Hazy view, grossly Flat, mild ERM superiorly, Blunted foveal reflex, RPE mottling and clumping Flat, Blunted foveal reflex, Retinal pigment epithelial mottling and clumping, No heme or edema   Vessels Vascular attenuation, AV crossing changes, Tortuous Vascular attenuation, Tortuous   Periphery attached, good 360 laser in place; ORIGINALLY: bullous superior retinal detachment from 1000-0130, another more shallow detachment lobe from 130-400, lattice and micro tears ST quadrant; Old retinal tear at 0900 with surrounding laser, Attached, peripheral laser scars at 1030         Refraction    Manifest Refraction (Auto)      Sphere Cylinder Axis Dist VA   Right -2.50 +4.25 093 20/200-   Left -0.75 +0.75 175 20/25-2          IMAGING AND PROCEDURES  Imaging and Procedures for _0 @  OCT, Retina - OU - Both Eyes       Right Eye Quality was borderline. Central Foveal Thickness: 264. Progression has been stable. Findings include normal foveal contour, no SRF, no IRF (Retina re-attached; mild ERM, patchy ellipsoid signal).   Left Eye Quality was good. Central Foveal Thickness: 263. Progression has been stable. Findings include normal foveal contour, no IRF, no SRF.   Notes *Images captured and stored on drive  Diagnosis / Impression:  OD: Retina re-attached; patchy ellipsoid signal OS: NFP, no IRF/SRF   Clinical management:  See below  Abbreviations: NFP - Normal foveal profile. CME - cystoid macular edema. PED - pigment epithelial detachment. IRF - intraretinal fluid. SRF - subretinal fluid. EZ - ellipsoid zone. ERM - epiretinal membrane. ORA - outer retinal atrophy. ORT - outer retinal tubulation. SRHM - subretinal hyper-reflective material                  ASSESSMENT/PLAN:    ICD-10-CM   1. Dislocation of intraocular lens, sequela  T85.22XS   2. Aphakia of right eye  H27.01   3. Right retinal detachment  H33.21   4. 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 Retinal edema  H35.81 OCT, Retina - OU - Both Eyes  6. History of repair of retinal tear by laser photocoagulation  Z98.890   7. Diabetes mellitus type 2 without retinopathy ([place]  E11.9   8. Essential hypertension  I10   9. Hypertensive retinopathy of both eyes  H35.033   10. Pseudophakia of both eyes  Z96.1    1,2. Dislocated IOL OD  - pt with history of partially dislocated 3-piece IOL -- spontaneously dislocated completely into vitreous cavity on 1.23.21  - s/p 25g PPV w/ IOL explantation and secondary sutured IOL OD -- Akreos AO60 lens, 17.5D -- 02.11.2021             - IOL in good position  -  cornea improving -- less edema; no epi defect  - BCVA 20/80             - IOP okay at 18             - decrease PF to 6x/day  - stop zymaxid  - dec PSO ung to bedtime / PRN during day             - post op drop and positioning instructions reviewed              - tylenol/ibuprofen for pain              - f/u 2 weeks for K check   3-5. Rhegmatogenous retinal detachment, right eye  - bullous, superior, mac off detachment, onset of foveal involvement 11.11.20 by history  - Bi-lobed superior detachment, superior lobe spanning 1000-0130, nasal lobe 0130-0400  - lattice degeneration with microtears noted at 1030  - s/p PPV/PFC/EL/FAX/14% C3F8 OD, 11.12.20             - intraop: HST at 2 oclock was found; also detachment had progressed to subtotal detachment spanning 9 oclock to 6 oclock (going in clockwise direction); also severe zonular insufficiency with IOL very mobile throughout case -- did not dislocate completely during the case or immediately post op  - doing well              - retina attached and in good position -- good laser in place  6. History of retinal defects  s/p laser retinopexy OU with Dr. Zigmund Daniel in 2013  - OD laser at 0900  - OS laser at 1030  - stable  7. Diabetes mellitus, type 2 without retinopathy  - The incidence, risk factors for progression, natural history and treatment options for diabetic retinopathy  were discussed with patient.    - The need for close monitoring of blood glucose, blood pressure, and serum lipids, avoiding cigarette or any type of tobacco, and the need for long term follow up was also discussed with patient  8,9. Hypertensive retinopathy OU  - discussed importance of tight BP control  - monitor  10. Pseudophakia OU  - s/p CE/IOL OU (Dr. [name])  - 3 piece IOL OD was completely displaced into vitreous -- now with sutured Akreos IOL in good position -- see above  - OS 3-piece IOL in good position  - monitor   Ophthalmic Meds Ordered this visit:  No orders of the defined types were placed in this encounter.      Return in about 2 weeks (around 10/11/2019) for f/u dislocated IOL OD, DFE, OCT.  There are no Patient Instructions on file for this visit.   Explained the diagnoses, plan, and follow up with the patient and they expressed understanding.  Patient expressed understanding of the importance of proper follow up care.  This document serves as a record of services personally performed by Gardiner Sleeper, MD, PhD. It was created on their behalf by Ernest Mallick, OA, an ophthalmic assistant. The creation of this record is the provider's dictation and/or activities during the visit.    Electronically signed by: Ernest Mallick, OA 02.25.2021 4:59 PM   Gardiner Sleeper, M.D., Ph.D. Diseases & Surgery of the Retina and Vitreous [facility]  I have reviewed the above documentation for accuracy and completeness, and I agree with the above. Gardiner Sleeper, M.D., Ph.D. 09/27/19 5:01 PM   Abbreviations: M myopia (nearsighted); A  astigmatism; H hyperopia (farsighted); P presbyopia; Mrx  spectacle prescription;  CTL contact lenses; OD right eye; OS left eye; OU both eyes  XT exotropia; ET esotropia; PEK punctate epithelial keratitis; PEE punctate epithelial erosions; DES dry eye syndrome; MGD meibomian gland dysfunction; ATs artificial tears; PFAT's preservative free artificial tears; [place] nuclear sclerotic cataract; PSC posterior subcapsular cataract; ERM epi-retinal membrane; PVD posterior vitreous detachment; RD retinal detachment; DM diabetes mellitus; DR diabetic retinopathy; NPDR non-proliferative diabetic retinopathy; PDR proliferative diabetic retinopathy; CSME clinically significant macular edema; DME diabetic macular edema; dbh dot blot hemorrhages; CWS cotton wool spot; POAG primary open angle glaucoma; C/D cup-to-disc ratio; HVF humphrey visual field; GVF goldmann visual field; OCT optical coherence tomography; IOP intraocular pressure; BRVO Branch retinal vein occlusion; CRVO central retinal vein occlusion; CRAO central retinal artery occlusion; BRAO branch retinal artery occlusion; RT retinal tear; SB scleral buckle; PPV pars plana vitrectomy; VH Vitreous hemorrhage; PRP panretinal laser photocoagulation; IVK intravitreal kenalog; VMT vitreomacular traction; MH Macular hole;  NVD neovascularization of the disc; NVE neovascularization elsewhere; AREDS age related eye disease study; ARMD age related macular degeneration; POAG primary open angle glaucoma; EBMD epithelial/anterior basement membrane dystrophy; ACIOL anterior chamber intraocular lens; IOL intraocular lens; PCIOL posterior chamber intraocular lens; Phaco/IOL phacoemulsification with intraocular lens placement; [place] photorefractive keratectomy; LASIK laser assisted in situ keratomileusis; HTN hypertension; DM diabetes mellitus; COPD chronic obstructive pulmonary disease

## 2019-09-27 ENCOUNTER — Other Ambulatory Visit: Payer: Self-pay

## 2019-09-27 ENCOUNTER — Ambulatory Visit (INDEPENDENT_AMBULATORY_CARE_PROVIDER_SITE_OTHER): Payer: Medicare Other | Admitting: Ophthalmology

## 2019-09-27 ENCOUNTER — Encounter (INDEPENDENT_AMBULATORY_CARE_PROVIDER_SITE_OTHER): Payer: Self-pay | Admitting: Ophthalmology

## 2019-09-27 DIAGNOSIS — Z9889 Other specified postprocedural states: Secondary | ICD-10-CM

## 2019-09-27 DIAGNOSIS — T8522XS Displacement of intraocular lens, sequela: Secondary | ICD-10-CM

## 2019-09-27 DIAGNOSIS — H3321 Serous retinal detachment, right eye: Secondary | ICD-10-CM

## 2019-09-27 DIAGNOSIS — H35033 Hypertensive retinopathy, bilateral: Secondary | ICD-10-CM

## 2019-09-27 DIAGNOSIS — E119 Type 2 diabetes mellitus without complications: Secondary | ICD-10-CM

## 2019-09-27 DIAGNOSIS — H3581 Retinal edema: Secondary | ICD-10-CM

## 2019-09-27 DIAGNOSIS — Z961 Presence of intraocular lens: Secondary | ICD-10-CM

## 2019-09-27 DIAGNOSIS — I10 Essential (primary) hypertension: Secondary | ICD-10-CM

## 2019-09-27 DIAGNOSIS — H35411 Lattice degeneration of retina, right eye: Secondary | ICD-10-CM

## 2019-09-27 DIAGNOSIS — H2701 Aphakia, right eye: Secondary | ICD-10-CM

## 2019-10-04 ENCOUNTER — Other Ambulatory Visit: Payer: Self-pay | Admitting: Cardiology

## 2019-10-10 NOTE — Progress Notes (Signed)
[facility] Note  10/11/2019     CHIEF COMPLAINT Patient presents for Retina Follow Up   HISTORY OF PRESENT ILLNESS: Cody Phelps is a 79 y.o. male who presents to the clinic today for:   HPI    Retina Follow Up    Patient presents with  Retinal Break/Detachment.  In right eye.  This started 2 weeks ago.  Severity is moderate.          Comments    Patient here for 2 weeks retina follow up for dislocated IOL OD. Patient states vision getting better. Still fuzzy. No eye pain.        Last edited by [name], COA on 10/11/2019  2:53 PM. (History)    pt states    Referring physician: [name], MD [place],  [place] [id number]  HISTORICAL INFORMATION:   Selected notes from the [facility] Referred by Dr. [name] for concern of RD OD   CURRENT MEDICATIONS: Current Outpatient Medications (Ophthalmic Drugs)  Medication Sig  . Bromfenac Sodium (PROLENSA) 0.07 % SOLN Place 1 drop into the right eye 4 (four) times daily.  . dorzolamide-timolol (COSOPT) 22.3-6.8 MG/ML ophthalmic solution Place 1 drop into the right eye 2 (two) times daily.  . prednisoLONE acetate (PRED FORTE) 1 % ophthalmic suspension Place 1 drop into the right eye every 2 (two) hours while awake.   No current facility-administered medications for this visit. (Ophthalmic Drugs)   Current Outpatient Medications (Other)  Medication Sig  . amiodarone (PACERONE) 200 MG tablet TAKE 1 TABLET BY MOUTH  DAILY (Patient taking differently: Take 200 mg by mouth daily. )  . atorvastatin (LIPITOR) 80 MG tablet TAKE 1 TABLET BY MOUTH  DAILY  . colesevelam (WELCHOL) 625 MG tablet Take 625 mg by mouth 3 (three) times daily.   Marland Kitchen donepezil (ARICEPT) 10 MG tablet Take 10 mg by mouth every morning.   Marland Kitchen esomeprazole (NEXIUM) 40 MG capsule Take 40 mg by mouth daily.   . ferrous sulfate 325 (65 FE) MG tablet Take 325 mg by mouth 3 (three) times daily with meals.   . gabapentin (NEURONTIN) 300 MG capsule Take 300 mg by mouth 2 (two) times daily.   . insulin glargine (LANTUS SOLOSTAR) 100 UNIT/ML injection Inject 20-40 Units into the skin at bedtime as needed (BGL below 140-150 take 20 units, if 151 or higher take 40 units).   . memantine (NAMENDA) 5 MG tablet Take 5 mg by mouth 2 (two) times daily.   . metoprolol tartrate (LOPRESSOR) 25 MG tablet Take 1 tablet (25 mg total) by mouth daily.  . mirabegron ER (MYRBETRIQ) 50 MG TB24 tablet Take 50 mg by mouth at bedtime.   Marland Kitchen omega-3 acid ethyl esters (LOVAZA) 1 G capsule Take 2 g by mouth daily.   Marland Kitchen rOPINIRole (REQUIP) 2 MG tablet Take 2 mg by mouth 3 (three) times daily as needed (RLS).   Marland Kitchen spironolactone (ALDACTONE) 25 MG tablet Take 0.5 tablets (12.5 mg total) by mouth daily.  . tamsulosin (FLOMAX) 0.4 MG CAPS capsule Take 0.4 mg by mouth daily after supper.   . valsartan-hydrochlorothiazide (DIOVAN-HCT) 80-12.5 MG tablet TAKE 1 TABLET BY MOUTH IN  THE MORNING (Patient taking differently: Take 1 tablet by mouth every morning. )  . vitamin B-12 (CYANOCOBALAMIN) 1000 MCG tablet Take 1,000 mcg by mouth 2 (two) times daily.  Alveda Reasons 15 MG TABS tablet TAKE 1 TABLET BY MOUTH  EVERY  EVENING AFTER DINNER (Patient taking differently: Take 15 mg by mouth daily with supper. )   No current facility-administered medications for this visit. (Other)      REVIEW OF SYSTEMS: ROS    Positive for: Endocrine, Cardiovascular, Eyes, Respiratory   Negative for: Constitutional, Gastrointestinal, Neurological, Skin, Genitourinary, Musculoskeletal, HENT, Psychiatric, Allergic/Imm, Heme/Lymph   Last edited by [name], COA on 10/11/2019  2:53 PM. (History)       ALLERGIES No Known Allergies  PAST MEDICAL HISTORY Past Medical History:  Diagnosis Date  . Acute anterior wall MI ([place]) 03/01/2018  . Acute combined systolic and diastolic heart failure ([place]) 03/15/2018  . Bronchitis, acute 05/04/2012  . Burn   .  Carotid stenosis, left followed by dr [name], cardiologist--- bilateral bruit per note   per dr [name] note 76/2831  LICA 51-76% stenosis per duplex 05-14-2014  . Cataracts, bilateral    surgery to remove cataracts  . Chronic diastolic CHF (congestive heart failure) ([place]) 09/21/2018  . Dementia ([place])   . Diverticulosis   . DM (diabetes mellitus) type 2, uncontrolled, with ketoacidosis ([place]) 11/25/2013  . ED (erectile dysfunction)   . First degree heart block   . GERD (gastroesophageal reflux disease)   . Heart murmur   . History of blood transfusion    with open heart surgery  . Hypertension    cardiologsit-  dr [name]  . Iron deficiency anemia   . Mixed hyperlipidemia   . Morbid obesity ([place])   . Myocardial infarction ([place])    02/28/18  . OA (osteoarthritis)    right hip  . OSA on CPAP    followed by dr dohmeier, uses CPAP  . PAF (paroxysmal atrial fibrillation) ([name])    a. on Eliquis  . Prostate cancer ([place])    dx 03-17-2017 (bx)  Stage T2a, Gleason 4+4, PSA  4.43, vol 32.32--  s/p radiatctive prostate seed implants 07-10-2017 then IMRT and ADT  . RLS (restless legs syndrome)   . S/P aortic valve replacement with bioprosthetic valve 05/30/2018   23 mm Edwards Inspiris Resilia stented bovine pericardial tissue valve  . 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 S/P Maze operation for atrial fibrillation 05/30/2018   Complete bilateral atrial lesion set using bipolar radiofrequency and cryothermy ablation with clipping of LA appendage  . Scoliosis   . Severe aortic stenosis   . Trigger finger   . Type 2 diabetes mellitus ([place])   . Wears partial dentures    upper and lower   Past Surgical History:  Procedure Laterality Date  . AORTIC VALVE REPLACEMENT N/A 05/30/2018   Procedure: AORTIC VALVE REPLACEMENT (AVR) using Inspiris Valve, Size 23;  Surgeon: [name], MD;  Location: [place];  Service: Open Heart Surgery;  Laterality: N/A;  . APPENDECTOMY  1988  .  BILATERAL TOTAL ETHMOIDECTOMY AND SPHENOIDECTOMY  01-14-2009    dr bates   [facility]  . CARDIOVERSION N/A 08/10/2018   Procedure: CARDIOVERSION;  Surgeon: Adrian Prows, MD;  Location: [place];  Service: Cardiovascular;  Laterality: N/A;  . CARPAL TUNNEL RELEASE Bilateral 2013  . CATARACT EXTRACTION W/ INTRAOCULAR LENS  IMPLANT, BILATERAL  date?  . CORONARY ARTERY BYPASS GRAFT N/A 05/30/2018   Procedure: CORONARY ARTERY BYPASS GRAFTING (CABG) x one using left internal mammary artery;  Surgeon: [name], MD;  Location: [place];  Service: Open Heart Surgery;  Laterality: N/A;  . CORONARY BALLOON ANGIOPLASTY N/A 03/01/2018   Procedure: CORONARY  BALLOON ANGIOPLASTY;  Surgeon: Adrian Prows, MD;  Location: [place] [facility];  Service: Cardiovascular;  Laterality: N/A;  . CORONARY/GRAFT ACUTE MI REVASCULARIZATION N/A 03/01/2018   Procedure: Coronary/Graft Acute MI Revascularization;  Surgeon: Adrian Prows, MD;  Location: [place] [facility];  Service: Cardiovascular;  Laterality: N/A;  . CYSTOSCOPY  07/19/2017   Procedure: CYSTOSCOPY;  Surgeon: Nickie Retort, MD;  Location: [facility];  Service: Urology;;  No seeds found in [place]  . GAS/FLUID EXCHANGE Right 06/13/2019   Procedure: GAS/FLUID EXCHANGE;  Surgeon: [name], MD;  Location: [place];  Service: Ophthalmology;  Laterality: Right;  . LAMINECTOMY WITH POSTERIOR LATERAL ARTHRODESIS LEVEL 2 N/A 02/12/2014   Procedure: LUMBAR TWO-THREE,LUIMBAR THREE-FOUR LAMINECTOMY/FORAMINOTOMY;POSSIBLE POSTEROLATERAL ARTHRODESIS WITH AUTOGRAFT;  Surgeon: [name], MD;  Location: [facility];  Service: Neurosurgery;  Laterality: N/A;  . LEFT HEART CATH AND CORONARY ANGIOGRAPHY N/A 03/01/2018   Procedure: LEFT HEART CATH AND CORONARY ANGIOGRAPHY;  Surgeon: Adrian Prows, MD;  Location: [place] [facility];  Service: Cardiovascular;  Laterality: N/A;  . MAZE N/A 05/30/2018   Procedure: MAZE;  Surgeon: [name], MD;  Location: [place];   Service: Open Heart Surgery;  Laterality: N/A;  . ORIF RIGHT ANKLE FX'S  05/05/2001   retained hardware  . PARS PLANA VITRECTOMY Right 06/13/2019   Procedure: PARS PLANA VITRECTOMY WITH 25 GAUGE WITH LASER AND GAS;  Surgeon: [name], MD;  Location: [facility];  Service: Ophthalmology;  Laterality: Right;  . PARS PLANA VITRECTOMY Right 09/12/2019   Procedure: [place] Vitrectomy With 25 Gauge;  Surgeon: [name], MD;  Location: [place];  Service: Ophthalmology;  Laterality: Right;  . [place] INJECTION Right 06/13/2019   Procedure: PERFLUORONE INJECTION;  Surgeon: [name], MD;  Location: [place];  Service: Ophthalmology;  Laterality: Right;  . PHOTOCOAGULATION WITH LASER Right 06/13/2019   Procedure: PHOTOCOAGULATION WITH LASER;  Surgeon: [name], MD;  Location: [place];  Service: Ophthalmology;  Laterality: Right;  . PLACEMENT AND SUTURE OF SECONDARY INTRAOCULAR LENS Right 09/12/2019   Procedure: PLACEMENT AND SUTURE OF SECONDARY INTRAOCULAR LENS;  Surgeon: [name], MD;  Location: [place];  Service: Ophthalmology;  Laterality: Right;  . PROSTATE BIOPSY  03-17-2017   dr Pilar Jarvis office  . RADIOACTIVE SEED IMPLANT N/A 07/19/2017   Procedure: RADIOACTIVE SEED IMPLANT/BRACHYTHERAPY IMPLANT;  Surgeon: Nickie Retort, MD;  Location: [facility];  Service: Urology;  Laterality: N/A;  69 seeds implanted  . REMOVAL RETAINED LENS Right 09/12/2019   Procedure: REMOVAL RETAINED LENS;  Surgeon: [name], MD;  Location: [place];  Service: Ophthalmology;  Laterality: Right;  . RETINAL DETACHMENT SURGERY Right 06/13/2019   Dr. Coralyn Pear  . SKIN GRAFT     face  . SPACE OAR INSTILLATION N/A 07/19/2017   Procedure: SPACE OAR INSTILLATION;  Surgeon: Nickie Retort, MD;  Location: [facility];  Service: Urology;  Laterality: N/A;  . TEE WITHOUT CARDIOVERSION  03/20/2012   Procedure: TRANSESOPHAGEAL ECHOCARDIOGRAM (TEE);  Surgeon: Laverda Page, MD;   Location: [facility] END[facility];  Service: Cardiovascular;  Laterality: N/A;  normal LV; normal EF; normal RV; normal LA w/ left atrial appendage very small, normal function, interatrial septum intact without defect; normal RA; trace MR,TR, & PI; mild AV calcification and senile degeneration w/ mild stenosis, AVA 1.7cm^2;;   . TEE WITHOUT CARDIOVERSION N/A 05/30/2018   Procedure: TRANSESOPHAGEAL ECHOCARDIOGRAM (TEE);  Surgeon: [name], MD;  Location: [place];  Service: Open  Heart Surgery;  Laterality: N/A;  . TOTAL HIP ARTHROPLASTY Right 10/23/2017   Procedure: TOTAL HIP ARTHROPLASTY ANTERIOR APPROACH;  Surgeon: Frederik Pear, MD;  Location: [place];  Service: Orthopedics;  Laterality: Right;  . TRANSTHORACIC ECHOCARDIOGRAM  01-11-2017   dr [name]  (per echo note, no significant change in seveity of AS, no other diagnostic change)   moderate concentric LVH, ef 97%, grade 1 diastolic dysfunction/  moderate LAE/  mild , grade 1 AR w/ moderate AV calcification, mild to moderate restricted AV leaflets w/ moderate AS, AVA 1.16cm^2, peak grandient 74mHg, mean grandient 339mg/  trace MR, mild calcification MV annulus , mild MV leaflet calcification, mild MVS, peak grandient 4.9m41m, mean grandient 2.7mm2m trace TR    FAMILY HISTORY Family History  Problem Relation Age of Onset  . Stroke Mother   . Hypertension Mother   . Heart attack Father   . Heart murmur Father   . Hypertension Sister   . Colon cancer Neg Hx   . Stomach cancer Neg Hx   . Rectal cancer Neg Hx   . Pancreatic cancer Neg Hx     SOCIAL HISTORY Social History   Tobacco Use  . Smoking status: Former Smoker    Years: 1.00    Types: Cigarettes    Quit date: 02/04/1967    Years since quitting: 52.7  . Smokeless tobacco: Never Used  . Tobacco comment: smoked 1 q2-3 days  Substance Use Topics  . Alcohol use: Yes    Comment: occasional beer  . Drug use: No         OPHTHALMIC EXAM:  Base Eye Exam    Visual Acuity (Snellen -  Linear)      Right Left   Dist [place] 20/100 -2 20/20 -2   Dist ph  20/80 -2        Tonometry (Tonopen, 2:50 PM)      Right Left   Pressure 14 12       Pupils      Dark Light Shape React APD   Right 5 5 Round dilated None   Left 2 1 Round Brisk None       Visual Fields (Counting fingers)      Left Right    Full Full       Extraocular Movement      Right Left    Full Full       Neuro/Psych    Oriented x3: Yes   Mood/Affect: Normal       Dilation    Right eye: 1.0% Mydriacyl, 2.5% Phenylephrine @ 2:50 PM        Slit Lamp and Fundus Exam    External Exam      Right Left   External Periorbital edema        Slit Lamp Exam      Right Left   Lids/Lashes mild Meibomian gland dysfunction, mild Telangiectasia Dermatochalasis - upper lid, Meibomian gland dysfunction   Conjunctiva/Sclera Trace injection, sutures intact, Subconjunctival hemorrhage improved Temporal Pinguecula   Cornea edema improving--no Descemet's folds centrally, 3 nylon sutures at 12 (2 removed today)--closed corneal wound with trace descemet folds surrounding -- improving, trace Punctate epithelial erosions, Arcus 1+ Punctate epithelial erosions, +verticellata, trace endo pigment, Debris in tear film   Anterior Chamber Deep, 1-2+pigment Deep and quiet   Iris Round and poorly dilated to 6mm,54mattered Transillumination defects 360, +iridodonesis  Round and dilated, scattered, 360 Transillumination defects   Lens Sutured Akreos IOL well centered  3 piece Posterior chamber intraocular in good position, trace Posterior capsular opacification   Vitreous post vitrectomy, trace pigment Vitreous syneresis, Posterior vitreous detachment       Fundus Exam      Right Left   Disc 1+Pallor, Sharp rim Mild Pallor, Sharp rim, temporal PPP, mild tilt   C/D Ratio 0.3 0.4   Macula Hazy view, grossly Flat, mild ERM superiorly, Blunted foveal reflex, RPE mottling and clumping Flat, Blunted foveal reflex, Retinal pigment  epithelial mottling and clumping, No heme or edema   Vessels Vascular attenuation, AV crossing changes, Tortuous Vascular attenuation, Tortuous   Periphery attached, good 360 laser in place; ORIGINALLY: bullous superior retinal detachment from 1000-0130, another more shallow detachment lobe from 130-400, lattice and micro tears ST quadrant; Old retinal tear at 0900 with surrounding laser, Attached, peripheral laser scars at 1030           IMAGING AND PROCEDURES  Imaging and Procedures for _0 @  OCT, Retina - OU - Both Eyes       Right Eye Quality was borderline. Central Foveal Thickness: 264. Progression has worsened. Findings include normal foveal contour, no SRF, intraretinal fluid (Retina re-attached; mild ERM, patchy ellipsoid signal; interval development of nasal IRF/cystic changes).   Left Eye Quality was good. Central Foveal Thickness: 264. Progression has been stable. Findings include normal foveal contour, no IRF, no SRF.   Notes *Images captured and stored on drive  Diagnosis / Impression:  OD: Retina re-attached; patchy ellipsoid signal; interval development of nasal IRF/cystic changes OS: NFP, no IRF/SRF   Clinical management:  See below  Abbreviations: NFP - Normal foveal profile. CME - cystoid macular edema. PED - pigment epithelial detachment. IRF - intraretinal fluid. SRF - subretinal fluid. EZ - ellipsoid zone. ERM - epiretinal membrane. ORA - outer retinal atrophy. ORT - outer retinal tubulation. SRHM - subretinal hyper-reflective material                 ASSESSMENT/PLAN:    ICD-10-CM   1. Dislocation of intraocular lens, sequela  T85.22XS   2. Aphakia of right eye  H27.01   3. Right retinal detachment  H33.21   4. 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 Retinal edema  H35.81 OCT, Retina - OU - Both Eyes  6. History of repair of retinal tear by laser photocoagulation  Z98.890   7. Diabetes mellitus type 2 without retinopathy ([place])   E11.9   8. Essential hypertension  I10   9. Hypertensive retinopathy of both eyes  H35.033   10. Pseudophakia of both eyes  Z96.1    1,2. Dislocated IOL OD  - pt with history of partially dislocated 3-piece IOL -- spontaneously dislocated completely into vitreous cavity on 1.23.21  - s/p 25g PPV w/ IOL explantation and secondary sutured IOL OD -- Akreos AO60 lens, 17.5D -- 02.11.2021             - IOL in good position  - cornea improving -- less edema; no epi defect  - BCVA 20/80  - OCT shows new cystic changes / CME nasal macula  - 2 superior nylon sutures removed at slit lamp today, 03.12.21 -- 1 drop of gati administered post removal             - IOP okay at 14             - decrease PF to qid OD  - add Prolensa qid OD -- sample given in offce  -  PSO ung BID x3 days             - post op drop and positioning instructions reviewed              - tylenol/ibuprofen for pain              - f/u 2-3 weeks for K check   3-5. Rhegmatogenous retinal detachment, right eye  - bullous, superior, mac off detachment, onset of foveal involvement 11.11.20 by history  - Bi-lobed superior detachment, superior lobe spanning 1000-0130, nasal lobe 0130-0400  - lattice degeneration with microtears noted at 1030  - s/p PPV/PFC/EL/FAX/14% C3F8 OD, 11.12.20             - intraop: HST at 2 oclock was found; also detachment had progressed to subtotal detachment spanning 9 oclock to 6 oclock (going in clockwise direction); also severe zonular insufficiency with IOL very mobile throughout case -- did not dislocate completely during the case or immediately post op  - doing well              - retina attached and in good position -- good laser in place  6. History of retinal defects s/p laser retinopexy OU with Dr. Zigmund Daniel in 2013  - OD laser at 0900  - OS laser at 1030  - stable  7. Diabetes mellitus, type 2 without retinopathy  - The incidence, risk factors for progression, natural history and treatment  options for diabetic retinopathy  were discussed with patient.    - The need for close monitoring of blood glucose, blood pressure, and serum lipids, avoiding cigarette or any type of tobacco, and the need for long term follow up was also discussed with patient  8,9. Hypertensive retinopathy OU  - discussed importance of tight BP control  - monitor  10. Pseudophakia OU  - s/p CE/IOL OU (Dr. [name])  - 3 piece IOL OD was completely displaced into vitreous -- now with sutured Akreos IOL in good position -- see above  - OS 3-piece IOL in good position  - monitor   Ophthalmic Meds Ordered this visit:  Meds ordered this encounter  Medications  . Bromfenac Sodium (PROLENSA) 0.07 % SOLN    Sig: Place 1 drop into the right eye 4 (four) times daily.    Dispense:  6 mL    Refill:  2       Return for /u 2-3 weeks, K check, DFE, OCT.  There are no Patient Instructions on file for this visit.   Explained the diagnoses, plan, and follow up with the patient and they expressed understanding.  Patient expressed understanding of the importance of proper follow up care.  This document serves as a record of services personally performed by Gardiner Sleeper, MD, PhD. It was created on their behalf by Ernest Mallick, OA, an ophthalmic assistant. The creation of this record is the provider's dictation and/or activities during the visit.    Electronically signed by: Ernest Mallick, OA 03.11.2021 3:29 PM  Gardiner Sleeper, M.D., Ph.D. Diseases & Surgery of the Retina and Vitreous [facility]  I have reviewed the above documentation for accuracy and completeness, and I agree with the above. Gardiner Sleeper, M.D., Ph.D. 10/11/19 3:29 PM   Abbreviations: M myopia (nearsighted); A astigmatism; H hyperopia (farsighted); P presbyopia; Mrx spectacle prescription;  CTL contact lenses; OD right eye; OS left eye; OU both eyes  XT exotropia; ET esotropia; PEK punctate epithelial  keratitis;  PEE punctate epithelial erosions; DES dry eye syndrome; MGD meibomian gland dysfunction; ATs artificial tears; PFAT's preservative free artificial tears; [place] nuclear sclerotic cataract; PSC posterior subcapsular cataract; ERM epi-retinal membrane; PVD posterior vitreous detachment; RD retinal detachment; DM diabetes mellitus; DR diabetic retinopathy; NPDR non-proliferative diabetic retinopathy; PDR proliferative diabetic retinopathy; CSME clinically significant macular edema; DME diabetic macular edema; dbh dot blot hemorrhages; CWS cotton wool spot; POAG primary open angle glaucoma; C/D cup-to-disc ratio; HVF humphrey visual field; GVF goldmann visual field; OCT optical coherence tomography; IOP intraocular pressure; BRVO Branch retinal vein occlusion; CRVO central retinal vein occlusion; CRAO central retinal artery occlusion; BRAO branch retinal artery occlusion; RT retinal tear; SB scleral buckle; PPV pars plana vitrectomy; VH Vitreous hemorrhage; PRP panretinal laser photocoagulation; IVK intravitreal kenalog; VMT vitreomacular traction; MH Macular hole;  NVD neovascularization of the disc; NVE neovascularization elsewhere; AREDS age related eye disease study; ARMD age related macular degeneration; POAG primary open angle glaucoma; EBMD epithelial/anterior basement membrane dystrophy; ACIOL anterior chamber intraocular lens; IOL intraocular lens; PCIOL posterior chamber intraocular lens; Phaco/IOL phacoemulsification with intraocular lens placement; [place] photorefractive keratectomy; LASIK laser assisted in situ keratomileusis; HTN hypertension; DM diabetes mellitus; COPD chronic obstructive pulmonary disease

## 2019-10-11 ENCOUNTER — Ambulatory Visit (INDEPENDENT_AMBULATORY_CARE_PROVIDER_SITE_OTHER): Payer: Medicare Other | Admitting: Ophthalmology

## 2019-10-11 ENCOUNTER — Encounter (INDEPENDENT_AMBULATORY_CARE_PROVIDER_SITE_OTHER): Payer: Self-pay | Admitting: Ophthalmology

## 2019-10-11 DIAGNOSIS — I10 Essential (primary) hypertension: Secondary | ICD-10-CM

## 2019-10-11 DIAGNOSIS — H3581 Retinal edema: Secondary | ICD-10-CM | POA: Diagnosis not present

## 2019-10-11 DIAGNOSIS — H35411 Lattice degeneration of retina, right eye: Secondary | ICD-10-CM

## 2019-10-11 DIAGNOSIS — Z961 Presence of intraocular lens: Secondary | ICD-10-CM

## 2019-10-11 DIAGNOSIS — H2701 Aphakia, right eye: Secondary | ICD-10-CM

## 2019-10-11 DIAGNOSIS — T8522XS Displacement of intraocular lens, sequela: Secondary | ICD-10-CM

## 2019-10-11 DIAGNOSIS — H3321 Serous retinal detachment, right eye: Secondary | ICD-10-CM

## 2019-10-11 DIAGNOSIS — Z9889 Other specified postprocedural states: Secondary | ICD-10-CM

## 2019-10-11 DIAGNOSIS — H35033 Hypertensive retinopathy, bilateral: Secondary | ICD-10-CM

## 2019-10-11 DIAGNOSIS — E119 Type 2 diabetes mellitus without complications: Secondary | ICD-10-CM

## 2019-10-11 MED ORDER — PROLENSA 0.07 % OP SOLN: 1.0000 [drp] | Freq: Four times a day (QID) | OPHTHALMIC | 2 refills | Status: DC

## 2019-10-28 ENCOUNTER — Telehealth: Payer: Self-pay

## 2019-10-28 NOTE — Telephone Encounter (Signed)
Pt wife called to inform us that pt is having some swelling on legs and would like to restart the Torsemide 20mg  for swelling. Aware pt is not your pt is Dr. [name]. Please advise thank you

## 2019-10-28 NOTE — Telephone Encounter (Signed)
Okay with me. If no better, can see Dr. [name] in 2 weeks for f/u. Please send prescription if needed. 30 pills X1 refil.  Thanks MJP

## 2019-10-29 MED ORDER — TORSEMIDE 20 MG PO TABS: 20.0000 mg | ORAL_TABLET | Freq: Two times a day (BID) | ORAL | 1 refills | Status: DC | PRN

## 2019-10-29 NOTE — Telephone Encounter (Signed)
Called pt to inform him that his medication was send.

## 2019-11-01 ENCOUNTER — Ambulatory Visit (INDEPENDENT_AMBULATORY_CARE_PROVIDER_SITE_OTHER): Payer: Medicare Other | Admitting: Ophthalmology

## 2019-11-01 ENCOUNTER — Encounter (INDEPENDENT_AMBULATORY_CARE_PROVIDER_SITE_OTHER): Payer: Self-pay | Admitting: Ophthalmology

## 2019-11-01 DIAGNOSIS — H3321 Serous retinal detachment, right eye: Secondary | ICD-10-CM

## 2019-11-01 DIAGNOSIS — H2701 Aphakia, right eye: Secondary | ICD-10-CM

## 2019-11-01 DIAGNOSIS — E119 Type 2 diabetes mellitus without complications: Secondary | ICD-10-CM

## 2019-11-01 DIAGNOSIS — I10 Essential (primary) hypertension: Secondary | ICD-10-CM

## 2019-11-01 DIAGNOSIS — T8522XS Displacement of intraocular lens, sequela: Secondary | ICD-10-CM

## 2019-11-01 DIAGNOSIS — Z961 Presence of intraocular lens: Secondary | ICD-10-CM

## 2019-11-01 DIAGNOSIS — H0014 Chalazion left upper eyelid: Secondary | ICD-10-CM

## 2019-11-01 DIAGNOSIS — H02886 Meibomian gland dysfunction of left eye, unspecified eyelid: Secondary | ICD-10-CM

## 2019-11-01 DIAGNOSIS — Z9889 Other specified postprocedural states: Secondary | ICD-10-CM

## 2019-11-01 DIAGNOSIS — H35411 Lattice degeneration of retina, right eye: Secondary | ICD-10-CM

## 2019-11-01 DIAGNOSIS — H35033 Hypertensive retinopathy, bilateral: Secondary | ICD-10-CM

## 2019-11-01 DIAGNOSIS — H3581 Retinal edema: Secondary | ICD-10-CM | POA: Diagnosis not present

## 2019-11-01 DIAGNOSIS — H02883 Meibomian gland dysfunction of right eye, unspecified eyelid: Secondary | ICD-10-CM

## 2019-11-01 NOTE — Progress Notes (Signed)
[facility] Note  11/01/2019     CHIEF COMPLAINT Patient presents for Retina Follow Up   HISTORY OF PRESENT ILLNESS: Cody Phelps is a 79 y.o. male who presents to the clinic today for:   HPI    Retina Follow Up    Patient presents with  Other.  In right eye.  This started 3 weeks ago.  Severity is moderate.  I, the attending physician,  performed the HPI with the patient and updated documentation appropriately.          Comments    Patient here for 3 weeks secondary IOL OD(2.11) Patient states vision doing about the same. May be a little bit better. No eye pain. Has a stye left upper lid. Asking if needs a drop for the stye.       Last edited by [name], MD on 11/01/2019  3:00 PM. (History)    pt states vision is doing well.  Pt denies eye pain or discomfort.  Pt reports no issues with suture removal.   Referring physician: [name], MD [place],  [place] [id number]  HISTORICAL INFORMATION:   Selected notes from the [place] Referred by Dr. [name] for concern of RD OD   CURRENT MEDICATIONS: Current Outpatient Medications (Ophthalmic Drugs)  Medication Sig  . Bromfenac Sodium (PROLENSA) 0.07 % SOLN Place 1 drop into the right eye 4 (four) times daily.  . dorzolamide-timolol (COSOPT) 22.3-6.8 MG/ML ophthalmic solution Place 1 drop into the right eye 2 (two) times daily.  . prednisoLONE acetate (PRED FORTE) 1 % ophthalmic suspension Place 1 drop into the right eye every 2 (two) hours while awake.   No current facility-administered medications for this visit. (Ophthalmic Drugs)   Current Outpatient Medications (Other)  Medication Sig  . amiodarone (PACERONE) 200 MG tablet TAKE 1 TABLET BY MOUTH  DAILY (Patient taking differently: Take 200 mg by mouth daily. )  . atorvastatin (LIPITOR) 80 MG tablet TAKE 1 TABLET BY MOUTH  DAILY  . colesevelam (WELCHOL) 625 MG tablet Take 625 mg by mouth 3 (three) times  daily.   Marland Kitchen donepezil (ARICEPT) 10 MG tablet Take 10 mg by mouth every morning.   Marland Kitchen esomeprazole (NEXIUM) 40 MG capsule Take 40 mg by mouth daily.   . ferrous sulfate 325 (65 FE) MG tablet Take 325 mg by mouth 3 (three) times daily with meals.  . gabapentin (NEURONTIN) 300 MG capsule Take 300 mg by mouth 2 (two) times daily.   . insulin glargine (LANTUS SOLOSTAR) 100 UNIT/ML injection Inject 20-40 Units into the skin at bedtime as needed (BGL below 140-150 take 20 units, if 151 or higher take 40 units).   . memantine (NAMENDA) 5 MG tablet Take 5 mg by mouth 2 (two) times daily.   . metoprolol tartrate (LOPRESSOR) 25 MG tablet Take 1 tablet (25 mg total) by mouth daily.  . mirabegron ER (MYRBETRIQ) 50 MG TB24 tablet Take 50 mg by mouth at bedtime.   Marland Kitchen omega-3 acid ethyl esters (LOVAZA) 1 G capsule Take 2 g by mouth daily.   Marland Kitchen rOPINIRole (REQUIP) 2 MG tablet Take 2 mg by mouth 3 (three) times daily as needed (RLS).   Marland Kitchen spironolactone (ALDACTONE) 25 MG tablet Take 0.5 tablets (12.5 mg total) by mouth daily.  . tamsulosin (FLOMAX) 0.4 MG CAPS capsule Take 0.4 mg by mouth daily after supper.   . torsemide (DEMADEX) 20 MG tablet Take 1 tablet (20  mg total) by mouth 2 (two) times daily as needed.  . valsartan-hydrochlorothiazide (DIOVAN-HCT) 80-12.5 MG tablet TAKE 1 TABLET BY MOUTH IN  THE MORNING (Patient taking differently: Take 1 tablet by mouth every morning. )  . vitamin B-12 (CYANOCOBALAMIN) 1000 MCG tablet Take 1,000 mcg by mouth 2 (two) times daily.  Alveda Reasons 15 MG TABS tablet TAKE 1 TABLET BY MOUTH  EVERY EVENING AFTER DINNER (Patient taking differently: Take 15 mg by mouth daily with supper. )   No current facility-administered medications for this visit. (Other)      REVIEW OF SYSTEMS: ROS    Positive for: Endocrine, Cardiovascular, Eyes, Respiratory   Negative for: Constitutional, Gastrointestinal, Neurological, Skin, Genitourinary, Musculoskeletal, HENT, Psychiatric, Allergic/Imm,  Heme/Lymph   Last edited by [name], COA on 11/01/2019  2:39 PM. (History)       ALLERGIES No Known Allergies  PAST MEDICAL HISTORY Past Medical History:  Diagnosis Date  . Acute anterior wall MI ([place]) 03/01/2018  . Acute combined systolic and diastolic heart failure ([place]) 03/15/2018  . Bronchitis, acute 05/04/2012  . Burn   . Carotid stenosis, left followed by dr [name], cardiologist--- bilateral bruit per note   per dr [name] note [id number]/3154  LICA 00-86% stenosis per duplex 05-14-2014  . Cataracts, bilateral    surgery to remove cataracts  . Chronic diastolic CHF (congestive heart failure) ([place]) 09/21/2018  . Dementia ([place])   . Diverticulosis   . DM (diabetes mellitus) type 2, uncontrolled, with ketoacidosis ([place]) 11/25/2013  . ED (erectile dysfunction)   . First degree heart block   . GERD (gastroesophageal reflux disease)   . Heart murmur   . History of blood transfusion    with open heart surgery  . Hypertension    cardiologsit-  dr [name]  . Iron deficiency anemia   . Mixed hyperlipidemia   . Morbid obesity ([place])   . Myocardial infarction ([place])    02/28/18  . OA (osteoarthritis)    right hip  . OSA on CPAP    followed by dr dohmeier, uses CPAP  . PAF (paroxysmal atrial fibrillation) ([name])    a. on Eliquis  . Prostate cancer ([place])    dx 03-17-2017 (bx)  Stage T2a, Gleason 4+4, PSA  4.43, vol 32.32--  s/p radiatctive prostate seed implants 07-10-2017 then IMRT and ADT  . RLS (restless legs syndrome)   . S/P aortic valve replacement with bioprosthetic valve 05/30/2018   23 mm [place] Inspiris Resilia stented bovine pericardial tissue valve  . 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 S/P Maze operation for atrial fibrillation 05/30/2018   Complete bilateral atrial lesion set using bipolar radiofrequency and cryothermy ablation with clipping of LA appendage  . Scoliosis   . Severe aortic stenosis   . Trigger finger   . Type 2 diabetes mellitus ([place])   .  Wears partial dentures    upper and lower   Past Surgical History:  Procedure Laterality Date  . AORTIC VALVE REPLACEMENT N/A 05/30/2018   Procedure: AORTIC VALVE REPLACEMENT (AVR) using Inspiris Valve, Size 23;  Surgeon: [name], MD;  Location: [place];  Service: Open Heart Surgery;  Laterality: N/A;  . APPENDECTOMY  1988  . BILATERAL TOTAL ETHMOIDECTOMY AND SPHENOIDECTOMY  01-14-2009    dr bates   [facility]  . CARDIOVERSION N/A 08/10/2018   Procedure: CARDIOVERSION;  Surgeon: Adrian Prows, MD;  Location: [place];  Service: Cardiovascular;  Laterality: N/A;  . CARPAL  TUNNEL RELEASE Bilateral 2013  . CATARACT EXTRACTION W/ INTRAOCULAR LENS  IMPLANT, BILATERAL  date?  . CORONARY ARTERY BYPASS GRAFT N/A 05/30/2018   Procedure: CORONARY ARTERY BYPASS GRAFTING (CABG) x one using left internal mammary artery;  Surgeon: [name], MD;  Location: [place];  Service: Open Heart Surgery;  Laterality: N/A;  . CORONARY BALLOON ANGIOPLASTY N/A 03/01/2018   Procedure: CORONARY BALLOON ANGIOPLASTY;  Surgeon: Adrian Prows, MD;  Location: [place] [facility];  Service: Cardiovascular;  Laterality: N/A;  . CORONARY/GRAFT ACUTE MI REVASCULARIZATION N/A 03/01/2018   Procedure: Coronary/Graft Acute MI Revascularization;  Surgeon: Adrian Prows, MD;  Location: [place] [facility];  Service: Cardiovascular;  Laterality: N/A;  . CYSTOSCOPY  07/19/2017   Procedure: CYSTOSCOPY;  Surgeon: Nickie Retort, MD;  Location: [facility];  Service: Urology;;  No seeds found in [place]  . GAS/FLUID EXCHANGE Right 06/13/2019   Procedure: GAS/FLUID EXCHANGE;  Surgeon: [name], MD;  Location: [place];  Service: Ophthalmology;  Laterality: Right;  . LAMINECTOMY WITH POSTERIOR LATERAL ARTHRODESIS LEVEL 2 N/A 02/12/2014   Procedure: LUMBAR TWO-THREE,LUIMBAR THREE-FOUR LAMINECTOMY/FORAMINOTOMY;POSSIBLE POSTEROLATERAL ARTHRODESIS WITH AUTOGRAFT;  Surgeon: [name], MD;  Location: [facility];  Service:  Neurosurgery;  Laterality: N/A;  . LEFT HEART CATH AND CORONARY ANGIOGRAPHY N/A 03/01/2018   Procedure: LEFT HEART CATH AND CORONARY ANGIOGRAPHY;  Surgeon: Adrian Prows, MD;  Location: [place] [facility];  Service: Cardiovascular;  Laterality: N/A;  . MAZE N/A 05/30/2018   Procedure: MAZE;  Surgeon: [name], MD;  Location: [place];  Service: Open Heart Surgery;  Laterality: N/A;  . ORIF RIGHT ANKLE FX'S  05/05/2001   retained hardware  . PARS PLANA VITRECTOMY Right 06/13/2019   Procedure: PARS PLANA VITRECTOMY WITH 25 GAUGE WITH LASER AND GAS;  Surgeon: [name], MD;  Location: [place];  Service: Ophthalmology;  Laterality: Right;  . PARS PLANA VITRECTOMY Right 09/12/2019   Procedure: [place] Vitrectomy With 25 Gauge;  Surgeon: [name], MD;  Location: [place];  Service: Ophthalmology;  Laterality: Right;  . [place] INJECTION Right 06/13/2019   Procedure: PERFLUORONE INJECTION;  Surgeon: [name], MD;  Location: [place];  Service: Ophthalmology;  Laterality: Right;  . PHOTOCOAGULATION WITH LASER Right 06/13/2019   Procedure: PHOTOCOAGULATION WITH LASER;  Surgeon: [name], MD;  Location: [place];  Service: Ophthalmology;  Laterality: Right;  . PLACEMENT AND SUTURE OF SECONDARY INTRAOCULAR LENS Right 09/12/2019   Procedure: PLACEMENT AND SUTURE OF SECONDARY INTRAOCULAR LENS;  Surgeon: [name], MD;  Location: [place];  Service: Ophthalmology;  Laterality: Right;  . PROSTATE BIOPSY  03-17-2017   dr Pilar Jarvis office  . RADIOACTIVE SEED IMPLANT N/A 07/19/2017   Procedure: RADIOACTIVE SEED IMPLANT/BRACHYTHERAPY IMPLANT;  Surgeon: Nickie Retort, MD;  Location[facility];  Service: Urology;  Laterality: N/A;  69 seeds implanted  . REMOVAL RETAINED LENS Right 09/12/2019   Procedure: REMOVAL RETAINED LENS;  Surgeon: [name], MD;  Location: [place];  Service: Ophthalmology;  Laterality: Right;  . RETINAL DETACHMENT SURGERY Right 06/13/2019   Dr. Coralyn Pear  .  SKIN GRAFT     face  . SPACE OAR INSTILLATION N/A 07/19/2017   Procedure: SPACE OAR INSTILLATION;  Surgeon: Nickie Retort, MD;  Location: [facility];  Service: Urology;  Laterality: N/A;  . TEE WITHOUT CARDIOVERSION  03/20/2012   Procedure: TRANSESOPHAGEAL ECHOCARDIOGRAM (TEE);  Surgeon: Laverda Page, MD;  Location: [place];  Service: Cardiovascular;  Laterality: N/A;  normal LV; normal EF; normal RV; normal LA w/ left atrial appendage very small, normal function, interatrial septum intact without defect; normal RA; trace MR,TR, & PI; mild AV calcification and senile degeneration w/ mild stenosis, AVA 1.7cm^2;;   . TEE WITHOUT CARDIOVERSION N/A 05/30/2018   Procedure: TRANSESOPHAGEAL ECHOCARDIOGRAM (TEE);  Surgeon: [name], MD;  Location: [place];  Service: Open Heart Surgery;  Laterality: N/A;  . TOTAL HIP ARTHROPLASTY Right 10/23/2017   Procedure: TOTAL HIP ARTHROPLASTY ANTERIOR APPROACH;  Surgeon: Frederik Pear, MD;  Location: [place];  Service: Orthopedics;  Laterality: Right;  . TRANSTHORACIC ECHOCARDIOGRAM  01-11-2017   dr [name]  (per echo note, no significant change in seveity of AS, no other diagnostic change)   moderate concentric LVH, ef 05%, grade 1 diastolic dysfunction/  moderate LAE/  mild , grade 1 AR w/ moderate AV calcification, mild to moderate restricted AV leaflets w/ moderate AS, AVA 1.16cm^2, peak grandient 70mHg, mean grandient 331mg/  trace MR, mild calcification MV annulus , mild MV leaflet calcification, mild MVS, peak grandient 4.26m42m, mean grandient 2.7mm4m trace TR    FAMILY HISTORY Family History  Problem Relation Age of Onset  . Stroke Mother   . Hypertension Mother   . Heart attack Father   . Heart murmur Father   . Hypertension Sister   . Colon cancer Neg Hx   . Stomach cancer Neg Hx   . Rectal cancer Neg Hx   . Pancreatic cancer Neg Hx     SOCIAL HISTORY Social History   Tobacco Use  . Smoking status: Former  Smoker    Years: 1.00    Types: Cigarettes    Quit date: 02/04/1967    Years since quitting: 52.7  . Smokeless tobacco: Never Used  . Tobacco comment: smoked 1 q2-3 days  Substance Use Topics  . Alcohol use: Yes    Comment: occasional beer  . Drug use: No         OPHTHALMIC EXAM:  Base Eye Exam    Visual Acuity (Snellen - Linear)      Right Left   Dist [place] 20/100 -2 20/25 -1   Dist ph [place] 20/40 -2 20/20 -1       Tonometry (Tonopen, 2:31 PM)      Right Left   Pressure 14 10       Pupils      Dark Light Shape React APD   Right 5 5 Round Dilated None   Left 2 1 Irregular Brisk None  OS oblong horizontally.       Visual Fields (Counting fingers)      Left Right    Full Full       Extraocular Movement      Right Left    Full, Ortho Full, Ortho       Neuro/Psych    Oriented x3: Yes   Mood/Affect: Normal       Dilation    Both eyes: 1.0% Mydriacyl, 2.5% Phenylephrine @ 2:30 PM        Slit Lamp and Fundus Exam    External Exam      Right Left   External Periorbital edema        Slit Lamp Exam      Right Left   Lids/Lashes mild Meibomian gland dysfunction, mild Telangiectasia Dermatochalasis - upper lid, Meibomian gland dysfunction, chalazion upper lid   Conjunctiva/Sclera Trace injection, sutures intact, Subconjunctival hemorrhage improved Temporal Pinguecula   Cornea edema  resolved--no Descemet's folds, well healed superior corneal wound w/ 1 nylon suture at 12; 2-3+ Punctate epithelial erosions, Arcus 1+ Punctate epithelial erosions, +verticellata, trace endo pigment, Debris in tear film   Anterior Chamber Deep, clear Deep and quiet   Iris Round and poorly dilated to 64m, scattered Transillumination defects 360 Round and dilated, scattered, 360 Transillumination defects   Lens Sutured Akreos IOL well centered  3 piece Posterior chamber intraocular in good position, trace Posterior capsular opacification   Vitreous post vitrectomy, trace pigment Vitreous  syneresis, Posterior vitreous detachment       Fundus Exam      Right Left   Disc 1+Pallor, Sharp rim Mild Pallor, Sharp rim, temporal PPP, mild tilt   C/D Ratio 0.3 0.4   Macula Blunted foveal reflex, mild RPE mottling and clumping, mild central cystic changes Flat, Blunted foveal reflex, Retinal pigment epithelial mottling and clumping, No heme or edema   Vessels Vascular attenuation, AV crossing changes, Tortuous Vascular attenuation, Tortuous   Periphery attached, good 360 laser in place; ORIGINALLY: bullous superior retinal detachment from 1000-0130, another more shallow detachment lobe from 130-400, lattice and micro tears ST quadrant; Old retinal tear at 0900 with surrounding laser, Attached, peripheral laser scars at 1030           IMAGING AND PROCEDURES  Imaging and Procedures for _0 @  OCT, Retina - OU - Both Eyes       Right Eye Quality was good. Central Foveal Thickness: 284. Progression has improved. Findings include normal foveal contour, no SRF, intraretinal fluid (Retina re-attached; mild ERM, patchy ellipsoid signal; interval improvement of nasal IRF/cystic changes).   Left Eye Quality was good. Central Foveal Thickness: 265. Progression has been stable. Findings include normal foveal contour, no IRF, no SRF.   Notes *Images captured and stored on drive  Diagnosis / Impression:  OD: Retina re-attached; patchy ellipsoid signal; interval improvement of nasal IRF/cystic changes OS: NFP, no IRF/SRF   Clinical management:  See below  Abbreviations: NFP - Normal foveal profile. CME - cystoid macular edema. PED - pigment epithelial detachment. IRF - intraretinal fluid. SRF - subretinal fluid. EZ - ellipsoid zone. ERM - epiretinal membrane. ORA - outer retinal atrophy. ORT - outer retinal tubulation. SRHM - subretinal hyper-reflective material                 ASSESSMENT/PLAN:    ICD-10-CM   1. Dislocation of intraocular lens, sequela  T85.22XS   2.  Aphakia of right eye  H27.01   3. Right retinal detachment  H33.21   4. 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 Retinal edema  H35.81 OCT, Retina - OU - Both Eyes  6. History of repair of retinal tear by laser photocoagulation  Z98.890   7. Diabetes mellitus type 2 without retinopathy ([place]  E11.9   8. Essential hypertension  I10   9. Hypertensive retinopathy of both eyes  H35.033   10. Pseudophakia of both eyes  Z96.1   11. Meibomian gland dysfunction (MGD) of both eyes  H02.883    H02.886   12. Chalazion left upper eyelid  H00.14    1,2. Dislocated IOL OD  - pt with history of partially dislocated 3-piece IOL -- spontaneously dislocated completely into vitreous cavity on 1.23.21  - s/p 25g PPV w/ IOL explantation and secondary sutured IOL OD -- Akreos AO60 lens, 17.5D -- 02.11.2021             - IOL in good  position  - 2 superior nylon sutures removed at slit lamp 03.12.21 -- 1 nylon suture remains at 1200  - cornea improving -- edema essentially resolved; no epi defect; just 2-3+ PEE  - BCVA improved to 20/40 from 20/80  - OCT shows interval improvement in cystic changes / CME nasal macula             - IOP okay at 14             - cont PF and Prolensa qid OD  - ATs QID OU             - post op drop instructions reviewed              - f/u 4 weeks for POV, MRx, Ks, and possible suture removal   3-5. Rhegmatogenous retinal detachment, right eye  - bullous, superior, mac off detachment, onset of foveal involvement 11.11.20 by history  - Bi-lobed superior detachment, superior lobe spanning 1000-0130, nasal lobe 0130-0400  - lattice degeneration with microtears noted at 1030  - s/p PPV/PFC/EL/FAX/14% C3F8 OD, 11.12.20             - intraop: HST at 2 oclock was found; also detachment had progressed to subtotal detachment spanning 9 oclock to 6 oclock (going in clockwise direction); also severe zonular insufficiency with IOL very mobile throughout case -- did not dislocate  completely during the case or immediately post op  - doing well              - retina attached and in good position -- good laser in place  6. History of retinal defects s/p laser retinopexy OU with Dr. Zigmund Daniel in 2013  - OD laser at 0900  - OS laser at 1030  - stable  7. Diabetes mellitus, type 2 without retinopathy  - The incidence, risk factors for progression, natural history and treatment options for diabetic retinopathy  were discussed with patient.    - The need for close monitoring of blood glucose, blood pressure, and serum lipids, avoiding cigarette or any type of tobacco, and the need for long term follow up was also discussed with patient  8,9. Hypertensive retinopathy OU  - discussed importance of tight BP control  - monitor  10. Pseudophakia OU  - s/p CE/IOL OU (Dr. [name])  - 3 piece IOL OD was completely displaced into vitreous -- now with sutured Akreos IOL in good position -- see above  - OS 3-piece IOL in good position  - monitor  11,12. MGD OU and Chalazion LUL  - Recommend lid scrubs and warm compresses BID for 20 minutes at a time   Ophthalmic Meds Ordered this visit:  No orders of the defined types were placed in this encounter.      Return in about 4 weeks (around 11/29/2019) for Dilated exam/OCT/ K check .  There are no Patient Instructions on file for this visit.   Explained the diagnoses, plan, and follow up with the patient and they expressed understanding.  Patient expressed understanding of the importance of proper follow up care.  This document serves as a record of services personally performed by Gardiner Sleeper, MD, PhD. It was created on their behalf by Ernest Mallick, OA, an ophthalmic assistant. The creation of this record is the provider's dictation and/or activities during the visit.    Electronically signed by: Ernest Mallick, OA 04.02.2021 3:48 PM  Gardiner Sleeper, M.D., Ph.D. Diseases & Surgery of the Retina  and Vitreous [facility]  I have reviewed the above documentation for accuracy and completeness, and I agree with the above. Gardiner Sleeper, M.D., Ph.D. 11/01/19 3:48 PM  Abbreviations: M myopia (nearsighted); A astigmatism; H hyperopia (farsighted); P presbyopia; Mrx spectacle prescription;  CTL contact lenses; OD right eye; OS left eye; OU both eyes  XT exotropia; ET esotropia; PEK punctate epithelial keratitis; PEE punctate epithelial erosions; DES dry eye syndrome; MGD meibomian gland dysfunction; ATs artificial tears; PFAT's preservative free artificial tears; [place] nuclear sclerotic cataract; PSC posterior subcapsular cataract; ERM epi-retinal membrane; PVD posterior vitreous detachment; RD retinal detachment; DM diabetes mellitus; DR diabetic retinopathy; NPDR non-proliferative diabetic retinopathy; PDR proliferative diabetic retinopathy; CSME clinically significant macular edema; DME diabetic macular edema; dbh dot blot hemorrhages; CWS cotton wool spot; POAG primary open angle glaucoma; C/D cup-to-disc ratio; HVF humphrey visual field; GVF goldmann visual field; OCT optical coherence tomography; IOP intraocular pressure; BRVO Branch retinal vein occlusion; CRVO central retinal vein occlusion; CRAO central retinal artery occlusion; BRAO branch retinal artery occlusion; RT retinal tear; SB scleral buckle; PPV pars plana vitrectomy; VH Vitreous hemorrhage; PRP panretinal laser photocoagulation; IVK intravitreal kenalog; VMT vitreomacular traction; MH Macular hole;  NVD neovascularization of the disc; NVE neovascularization elsewhere; AREDS age related eye disease study; ARMD age related macular degeneration; POAG primary open angle glaucoma; EBMD epithelial/anterior basement membrane dystrophy; ACIOL anterior chamber intraocular lens; IOL intraocular lens; PCIOL posterior chamber intraocular lens; Phaco/IOL phacoemulsification with intraocular lens placement; [place] photorefractive keratectomy; LASIK laser  assisted in situ keratomileusis; HTN hypertension; DM diabetes mellitus; COPD chronic obstructive pulmonary disease

## 2019-11-22 NOTE — Progress Notes (Signed)
[facility] Note  11/29/2019     CHIEF COMPLAINT Patient presents for Retina Follow Up   HISTORY OF PRESENT ILLNESS: Cody Phelps is a 79 y.o. male who presents to the clinic today for:   HPI    Retina Follow Up    Patient presents with  Other.  In right eye.  This started 2.5 months ago.  Severity is moderate.  Duration of 4 weeks.  Since onset it is gradually improving.  I, the attending physician,  performed the HPI with the patient and updated documentation appropriately.          Comments    79 y/o male pt here for 4 wk f/u for dislocated IOL OD.  S/p PPV w/IOL explantation and sutured IOL OD 2.11.21.  VA OD seems improved a bit.  No change in [place] OS.  Denies pain, FOL, floaters.  PF and Prolensa QID OD.  BS 130 this a.m.  A1C around 6.       Last edited by [name], MD on 12/01/2019 11:06 PM. (History)    pt states he feels like his eyes don't focus together and images appear smaller in the right eye than the left   Referring physician: [name], MD [place],  [place] [address]  HISTORICAL INFORMATION:   Selected notes from the [facility] Referred by Dr. [name] for concern of RD OD   CURRENT MEDICATIONS: Current Outpatient Medications (Ophthalmic Drugs)  Medication Sig  . Bromfenac Sodium (PROLENSA) 0.07 % SOLN Place 1 drop into the right eye 4 (four) times daily.  . dorzolamide-timolol (COSOPT) 22.3-6.8 MG/ML ophthalmic solution Place 1 drop into the right eye 2 (two) times daily.  Marland Kitchen gatifloxacin (ZYMAXID) 0.5 % SOLN Place 1 drop into the left eye 4 (four) times daily for 5 days.  . prednisoLONE acetate (PRED FORTE) 1 % ophthalmic suspension Place 1 drop into the right eye every 2 (two) hours while awake.   No current facility-administered medications for this visit. (Ophthalmic Drugs)   Current Outpatient Medications (Other)  Medication Sig  . amiodarone (PACERONE) 200 MG tablet TAKE 1 TABLET  BY MOUTH  DAILY (Patient taking differently: Take 200 mg by mouth daily. )  . atorvastatin (LIPITOR) 80 MG tablet TAKE 1 TABLET BY MOUTH  DAILY  . colesevelam (WELCHOL) 625 MG tablet Take 625 mg by mouth 3 (three) times daily.   Marland Kitchen donepezil (ARICEPT) 10 MG tablet Take 10 mg by mouth every morning.   Marland Kitchen esomeprazole (NEXIUM) 40 MG capsule Take 40 mg by mouth daily.   . ferrous sulfate 325 (65 FE) MG tablet Take 325 mg by mouth 3 (three) times daily with meals.  . gabapentin (NEURONTIN) 300 MG capsule Take 300 mg by mouth 2 (two) times daily.   . insulin glargine (LANTUS SOLOSTAR) 100 UNIT/ML injection Inject 20-40 Units into the skin at bedtime as needed (BGL below 140-150 take 20 units, if 151 or higher take 40 units).   . memantine (NAMENDA) 5 MG tablet Take 5 mg by mouth 2 (two) times daily.   . metoprolol tartrate (LOPRESSOR) 25 MG tablet Take 1 tablet (25 mg total) by mouth daily.  . mirabegron ER (MYRBETRIQ) 50 MG TB24 tablet Take 50 mg by mouth at bedtime.   Marland Kitchen omega-3 acid ethyl esters (LOVAZA) 1 G capsule Take 2 g by mouth daily.   Marland Kitchen rOPINIRole (REQUIP) 2 MG tablet Take 2 mg by mouth 3 (three)  times daily as needed (RLS).   Marland Kitchen spironolactone (ALDACTONE) 25 MG tablet Take 0.5 tablets (12.5 mg total) by mouth daily.  . tamsulosin (FLOMAX) 0.4 MG CAPS capsule Take 0.4 mg by mouth daily after supper.   . torsemide (DEMADEX) 20 MG tablet Take 1 tablet (20 mg total) by mouth 2 (two) times daily as needed.  . valsartan-hydrochlorothiazide (DIOVAN-HCT) 80-12.5 MG tablet TAKE 1 TABLET BY MOUTH IN  THE MORNING (Patient taking differently: Take 1 tablet by mouth every morning. )  . vitamin B-12 (CYANOCOBALAMIN) 1000 MCG tablet Take 1,000 mcg by mouth 2 (two) times daily.  Alveda Reasons 15 MG TABS tablet TAKE 1 TABLET BY MOUTH  EVERY EVENING AFTER DINNER (Patient taking differently: Take 15 mg by mouth daily with supper. )   No current facility-administered medications for this visit. (Other)       REVIEW OF SYSTEMS: ROS    Positive for: Musculoskeletal, Endocrine, Cardiovascular, Eyes, Respiratory   Negative for: Constitutional, Gastrointestinal, Neurological, Skin, Genitourinary, HENT, Psychiatric, Allergic/Imm, Heme/Lymph   Last edited by Matthew Folks, COA on 11/29/2019  2:55 PM. (History)       ALLERGIES No Known Allergies  PAST MEDICAL HISTORY Past Medical History:  Diagnosis Date  . Acute anterior wall MI ([place]) 03/01/2018  . Acute combined systolic and diastolic heart failure ([place]) 03/15/2018  . Bronchitis, acute 05/04/2012  . Burn   . Carotid stenosis, left followed by dr [name], cardiologist--- bilateral bruit per note   per dr [name] note 01/6225  LICA 33-35% stenosis per duplex 05-14-2014  . Chronic diastolic CHF (congestive heart failure) ([place]) 09/21/2018  . Dementia ([place])   . Diverticulosis   . DM (diabetes mellitus) type 2, uncontrolled, with ketoacidosis ([place]) 11/25/2013  . ED (erectile dysfunction)   . First degree heart block   . GERD (gastroesophageal reflux disease)   . Heart murmur   . History of blood transfusion    with open heart surgery  . Hypertension    cardiologsit-  dr [name]  . Hypertensive retinopathy    OU  . Iron deficiency anemia   . Mixed hyperlipidemia   . Morbid obesity ([place])   . Myocardial infarction ([place])    02/28/18  . OA (osteoarthritis)    right hip  . OSA on CPAP    followed by dr dohmeier, uses CPAP  . PAF (paroxysmal atrial fibrillation) ([name])    a. on Eliquis  . Prostate cancer ([place])    dx 03-17-2017 (bx)  Stage T2a, Gleason 4+4, PSA  4.43, vol 32.32--  s/p radiatctive prostate seed implants 07-10-2017 then IMRT and ADT  . Retinal detachment    Rheg. RD OS  . RLS (restless legs syndrome)   . S/P aortic valve replacement with bioprosthetic valve 05/30/2018   23 mm Edwards Inspiris Resilia stented bovine pericardial tissue valve  . 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 S/P Maze operation for  atrial fibrillation 05/30/2018   Complete bilateral atrial lesion set using bipolar radiofrequency and cryothermy ablation with clipping of LA appendage  . Scoliosis   . Severe aortic stenosis   . Trigger finger   . Type 2 diabetes mellitus ([place])   . Wears partial dentures    upper and lower   Past Surgical History:  Procedure Laterality Date  . AORTIC VALVE REPLACEMENT N/A 05/30/2018   Procedure: AORTIC VALVE REPLACEMENT (AVR) using Inspiris Valve, Size 23;  Surgeon: [name], MD;  Location: [facility];  Service: Open Heart Surgery;  Laterality: N/A;  . APPENDECTOMY  1988  . BILATERAL TOTAL ETHMOIDECTOMY AND SPHENOIDECTOMY  01-14-2009    dr bates   [facility]  . CARDIOVERSION N/A 08/10/2018   Procedure: CARDIOVERSION;  Surgeon: Adrian Prows, MD;  Location: [facility];  Service: Cardiovascular;  Laterality: N/A;  . CARPAL TUNNEL RELEASE Bilateral 2013  . CATARACT EXTRACTION Bilateral    Dr. [name]  . CATARACT EXTRACTION W/ INTRAOCULAR LENS  IMPLANT, BILATERAL  date?  . CORONARY ARTERY BYPASS GRAFT N/A 05/30/2018   Procedure: CORONARY ARTERY BYPASS GRAFTING (CABG) x one using left internal mammary artery;  Surgeon: [name], MD;  Location: [facility];  Service: Open Heart Surgery;  Laterality: N/A;  . CORONARY BALLOON ANGIOPLASTY N/A 03/01/2018   Procedure: CORONARY BALLOON ANGIOPLASTY;  Surgeon: Adrian Prows, MD;  Location: [facility];  Service: Cardiovascular;  Laterality: N/A;  . CORONARY/GRAFT ACUTE MI REVASCULARIZATION N/A 03/01/2018   Procedure: Coronary/Graft Acute MI Revascularization;  Surgeon: Adrian Prows, MD;  Location: [facility];  Service: Cardiovascular;  Laterality: N/A;  . CYSTOSCOPY  07/19/2017   Procedure: CYSTOSCOPY;  Surgeon: Nickie Retort, MD;  Location: [facility];  Service: Urology;;  No seeds found in [place]  . EYE SURGERY Bilateral    Cat Sx OU.  Dislocated IOL sx OD.  Rheg. RD repair OS.  Marica Otter EXCHANGE Right 06/13/2019    Procedure: GAS/FLUID EXCHANGE;  Surgeon: [name], MD;  Location: [facility];  Service: Ophthalmology;  Laterality: Right;  . LAMINECTOMY WITH POSTERIOR LATERAL ARTHRODESIS LEVEL 2 N/A 02/12/2014   Procedure: LUMBAR TWO-THREE,LUIMBAR THREE-FOUR LAMINECTOMY/FORAMINOTOMY;POSSIBLE POSTEROLATERAL ARTHRODESIS WITH AUTOGRAFT;  Surgeon: [name], MD;  Location: [facility];  Service: Neurosurgery;  Laterality: N/A;  . LEFT HEART CATH AND CORONARY ANGIOGRAPHY N/A 03/01/2018   Procedure: LEFT HEART CATH AND CORONARY ANGIOGRAPHY;  Surgeon: Adrian Prows, MD;  Location: [place] [facility];  Service: Cardiovascular;  Laterality: N/A;  . MAZE N/A 05/30/2018   Procedure: MAZE;  Surgeon: [name], MD;  Location: [facility];  Service: Open Heart Surgery;  Laterality: N/A;  . ORIF RIGHT ANKLE FX'S  05/05/2001   retained hardware  . PARS PLANA VITRECTOMY Right 06/13/2019   Procedure: PARS PLANA VITRECTOMY WITH 25 GAUGE WITH LASER AND GAS;  Surgeon: [name], MD;  Location: [facility];  Service: Ophthalmology;  Laterality: Right;  . PARS PLANA VITRECTOMY Right 09/12/2019   Procedure: [place]ana Vitrectomy With 25 Gauge;  Surgeon: [name], MD;  Location: [facility];  Service: Ophthalmology;  Laterality: Right;  . [place] INJECTION Right 06/13/2019   Procedure: PERFLUORONE INJECTION;  Surgeon: [name], MD;  Location: [facility];  Service: Ophthalmology;  Laterality: Right;  . PHOTOCOAGULATION WITH LASER Right 06/13/2019   Procedure: PHOTOCOAGULATION WITH LASER;  Surgeon: [name], MD;  Location: [facility];  Service: Ophthalmology;  Laterality: Right;  . PLACEMENT AND SUTURE OF SECONDARY INTRAOCULAR LENS Right 09/12/2019   Procedure: PLACEMENT AND SUTURE OF SECONDARY INTRAOCULAR LENS;  Surgeon: [name], MD;  Location: [facility];  Service: Ophthalmology;  Laterality: Right;  . PROSTATE BIOPSY  03-17-2017   dr Pilar Jarvis office  . RADIOACTIVE SEED IMPLANT N/A 07/19/2017   Procedure: RADIOACTIVE SEED  IMPLANT/BRACHYTHERAPY IMPLANT;  Surgeon: Nickie Retort, MD;  [facility];  Service: Urology;  Laterality: N/A;  69 seeds implanted  . REMOVAL RETAINED LENS Right 09/12/2019   Procedure: REMOVAL RETAINED LENS;  Surgeon: [name], MD;  Location: [facility];  Service: Ophthalmology;  Laterality: Right;  . RETINAL DETACHMENT SURGERY Right 06/13/2019   Dr. Coralyn Pear  . SKIN GRAFT     face  . SPACE OAR INSTILLATION N/A 07/19/2017   Procedure: SPACE OAR INSTILLATION;  Surgeon: Nickie Retort, MD;  [facility];  Service: Urology;  Laterality: N/A;  . TEE WITHOUT CARDIOVERSION  03/20/2012   Procedure: TRANSESOPHAGEAL ECHOCARDIOGRAM (TEE);  Surgeon: Laverda Page, MD;  Location: [facility];  Service: Cardiovascular;  Laterality: N/A;  normal LV; normal EF; normal RV; normal LA w/ left atrial appendage very small, normal function, interatrial septum intact without defect; normal RA; trace MR,TR, & PI; mild AV calcification and senile degeneration w/ mild stenosis, AVA 1.7cm^2;;   . TEE WITHOUT CARDIOVERSION N/A 05/30/2018   Procedure: TRANSESOPHAGEAL ECHOCARDIOGRAM (TEE);  Surgeon: [name], MD;  Location: [facility];  Service: Open Heart Surgery;  Laterality: N/A;  . TOTAL HIP ARTHROPLASTY Right 10/23/2017   Procedure: TOTAL HIP ARTHROPLASTY ANTERIOR APPROACH;  Surgeon: Frederik Pear, MD;  Location: [facility];  Service: Orthopedics;  Laterality: Right;  . TRANSTHORACIC ECHOCARDIOGRAM  01-11-2017   dr [name]  (per echo note, no significant change in seveity of AS, no other diagnostic change)   moderate concentric LVH, ef 49%, grade 1 diastolic dysfunction/  moderate LAE/  mild , grade 1 AR w/ moderate AV calcification, mild to moderate restricted AV leaflets w/ moderate AS, AVA 1.16cm^2, peak grandient 42mHg, mean grandient 318mg/  trace MR, mild calcification MV annulus , mild MV leaflet calcification, mild MVS, peak grandient 4.44m29m, mean grandient  2.7mm32m trace TR    FAMILY HISTORY Family History  Problem Relation Age of Onset  . Stroke Mother   . Hypertension Mother   . Heart attack Father   . Heart murmur Father   . Hypertension Sister   . Colon cancer Neg Hx   . Stomach cancer Neg Hx   . Rectal cancer Neg Hx   . Pancreatic cancer Neg Hx     SOCIAL HISTORY Social History   Tobacco Use  . Smoking status: Former Smoker    Years: 1.00    Types: Cigarettes    Quit date: 02/04/1967    Years since quitting: 52.8  . Smokeless tobacco: Never Used  . Tobacco comment: smoked 1 q2-3 days  Substance Use Topics  . Alcohol use: Yes    Comment: occasional beer  . Drug use: No         OPHTHALMIC EXAM:  Base Eye Exam    Visual Acuity (Snellen - Linear)      Right Left   Dist [place] 20/50 -2 20/20 -2   Dist ph [place] 20/30 -2        Tonometry (Tonopen, 2:59 PM)      Right Left   Pressure 13 10       Pupils      Dark Light Shape React APD   Right 3 2 Round Brisk None   Left 2 1 Round Brisk None       Visual Fields (Counting fingers)      Left Right    Full Full       Extraocular Movement      Right Left    Full, Ortho Full, Ortho       Neuro/Psych    Oriented x3: Yes   Mood/Affect: Normal       Dilation    Both eyes: 1.0% Mydriacyl, 2.5% Phenylephrine @ 2:59  PM        Slit Lamp and Fundus Exam    External Exam      Right Left   External Periorbital edema        Slit Lamp Exam      Right Left   Lids/Lashes mild Meibomian gland dysfunction, mild Telangiectasia Dermatochalasis - upper lid, Meibomian gland dysfunction, chalazion upper lid   Conjunctiva/Sclera Trace injection, sutures intact, Subconjunctival hemorrhage improved Temporal Pinguecula   Cornea well healed superior corneal wound w/ 1 nylon suture at 12; trace Punctate epithelial erosions, Arcus 1+ Punctate epithelial erosions, +verticellata, trace endo pigment, Debris in tear film   Anterior Chamber Deep, clear Deep and quiet   Iris Round  and poorly dilated to 4m, scattered Transillumination defects 360 Round and dilated, scattered, 360 Transillumination defects   Lens Sutured Akreos IOL well centered  3 piece Posterior chamber intraocular in good position, trace Posterior capsular opacification   Vitreous post vitrectomy, trace pigment Vitreous syneresis, Posterior vitreous detachment       Fundus Exam      Right Left   Disc 1+Pallor, Sharp rim Mild Pallor, Sharp rim, temporal PPP, mild tilt   C/D Ratio 0.3 0.4   Macula Blunted foveal reflex, mild RPE mottling and clumping, mild central cystic changes -- slightly increased Flat, Blunted foveal reflex, Retinal pigment epithelial mottling and clumping, No heme or edema   Vessels Vascular attenuation, AV crossing changes, Tortuous Vascular attenuation, Tortuous   Periphery attached, good 360 laser in place; ORIGINALLY: bullous superior retinal detachment from 1000-0130, another more shallow detachment lobe from 130-400, lattice and micro tears ST quadrant; Old retinal tear at 0900 with surrounding laser, Attached, peripheral laser scars at 1030         Refraction    Manifest Refraction      Sphere Cylinder Axis Dist VA   Right -1.00 +1.25 025 20/30-2   Left              IMAGING AND PROCEDURES  Imaging and Procedures for _0 @  OCT, Retina - OU - Both Eyes       Right Eye Quality was good. Central Foveal Thickness: 296. Progression has worsened. Findings include normal foveal contour, no SRF, intraretinal fluid (Interval increase in cystic changes).   Left Eye Quality was good. Central Foveal Thickness: 262. Progression has been stable. Findings include normal foveal contour, no IRF, no SRF.   Notes *Images captured and stored on drive  Diagnosis / Impression:  OD: Retina re-attached; patchy ellipsoid signal; interval increase in cystic changes OS: NFP, no IRF/SRF   Clinical management:  See below  Abbreviations: NFP - Normal foveal profile. CME -  cystoid macular edema. PED - pigment epithelial detachment. IRF - intraretinal fluid. SRF - subretinal fluid. EZ - ellipsoid zone. ERM - epiretinal membrane. ORA - outer retinal atrophy. ORT - outer retinal tubulation. SRHM - subretinal hyper-reflective material                 ASSESSMENT/PLAN:    ICD-10-CM   1. Dislocation of intraocular lens, sequela  T85.22XS   2. Aphakia of right eye  H27.01   3. Right retinal detachment  H33.21   4. 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 Retinal edema  H35.81 OCT, Retina - OU - Both Eyes  6. History of repair of retinal tear by laser photocoagulation  Z98.890   7. Diabetes mellitus type 2 without retinopathy ([place]olo  E11.9   8. Essential  hypertension  I10   9. Hypertensive retinopathy of both eyes  H35.033   10. Pseudophakia of both eyes  Z96.1   11. Meibomian gland dysfunction (MGD) of both eyes  H02.883    H02.886   12. Chalazion left upper eyelid  H00.14    1,2. Dislocated IOL OD  - pt with history of partially dislocated 3-piece IOL -- spontaneously dislocated completely into vitreous cavity on 1.23.21  - s/p 25g PPV w/ IOL explantation and secondary sutured IOL OD -- Akreos AO60 lens, 17.5D -- 02.11.2021             - IOL in good position  - 2 superior nylon sutures removed at slit lamp 03.12.21 -- 1 nylon suture remains at 1200 -- removed at slit lamp, 04.30.21  - cornea improving -- edema essentially resolved; no epi defect; trace PEE  - BCVA improved to 20/30 from 20/40  - OCT shows interval increase in cystic changes / CME nasal macula             - IOP okay at 13             - cont PF and Prolensa qid OD  - start gatifloxacin qid OD x5 days (post suture removal)  - ATs QID OU             - post op drop instructions reviewed              - f/u 4 weeks for POV, possible injection (STK) for CME   3-5. Rhegmatogenous retinal detachment, right eye  - bullous, superior, mac off detachment, onset of foveal involvement  11.11.20 by history  - Bi-lobed superior detachment, superior lobe spanning 1000-0130, nasal lobe 0130-0400  - lattice degeneration with microtears noted at 1030  - s/p PPV/PFC/EL/FAX/14% C3F8 OD, 11.12.20             - intraop: HST at 2 oclock was found; also detachment had progressed to subtotal detachment spanning 9 oclock to 6 oclock (going in clockwise direction); also severe zonular insufficiency with IOL very mobile throughout case -- did not dislocate completely during the case or immediately post op  - doing well              - retina attached and in good position -- good laser in place  6. History of retinal defects s/p laser retinopexy OU with Dr. Zigmund Daniel in 2013  - OD laser at 0900  - OS laser at 1030  - stable  7. Diabetes mellitus, type 2 without retinopathy  - The incidence, risk factors for progression, natural history and treatment options for diabetic retinopathy  were discussed with patient.    - The need for close monitoring of blood glucose, blood pressure, and serum lipids, avoiding cigarette or any type of tobacco, and the need for long term follow up was also discussed with patient  8,9. Hypertensive retinopathy OU  - discussed importance of tight BP control  - monitor  10. Pseudophakia OU  - s/p CE/IOL OU (Dr. [name])  - 3 piece IOL OD was completely displaced into vitreous -- now with sutured Akreos IOL in good position -- see above  - OS 3-piece IOL in good position  - monitor  11,12. MGD OU and Chalazion LUL  - Recommend lid scrubs and warm compresses BID for 20 minutes at a time   Ophthalmic Meds Ordered this visit:  Meds ordered this encounter  Medications  . Bromfenac Sodium (PROLENSA) 0.07 %  SOLN    Sig: Place 1 drop into the right eye 4 (four) times daily.    Dispense:  6 mL    Refill:  2  . gatifloxacin (ZYMAXID) 0.5 % SOLN    Sig: Place 1 drop into the left eye 4 (four) times daily for 5 days.    Dispense:  2.5 mL    Refill:  0        Return in about 4 weeks (around 12/27/2019) for f/u dislocated IOL OD, DFE, OCT.  There are no Patient Instructions on file for this visit.   Explained the diagnoses, plan, and follow up with the patient and they expressed understanding.  Patient expressed understanding of the importance of proper follow up care.  This document serves as a record of services personally performed by Gardiner Sleeper, MD, PhD. It was created on their behalf by Ernest Mallick, OA, an ophthalmic assistant. The creation of this record is the provider's dictation and/or activities during the visit.    Electronically signed by: Ernest Mallick, OA 04.23.2021 11:08 PM  Gardiner Sleeper, M.D., Ph.D. Diseases & Surgery of the Retina and Vitreous [facility]: M myopia (nearsighted); A astigmatism; H hyperopia (farsighted); P presbyopia; Mrx spectacle prescription;  CTL contact lenses; OD right eye; OS left eye; OU both eyes  XT exotropia; ET esotropia; PEK punctate epithelial keratitis; PEE punctate epithelial erosions; DES dry eye syndrome; MGD meibomian gland dysfunction; ATs artificial tears; PFAT's preservative free artificial tears; [place] nuclear sclerotic cataract; PSC posterior subcapsular cataract; ERM epi-retinal membrane; PVD posterior vitreous detachment; RD retinal detachment; DM diabetes mellitus; DR diabetic retinopathy; NPDR non-proliferative diabetic retinopathy; PDR proliferative diabetic retinopathy; CSME clinically significant macular edema; DME diabetic macular edema; dbh dot blot hemorrhages; CWS cotton wool spot; POAG primary open angle glaucoma; C/D cup-to-disc ratio; HVF humphrey visual field; GVF goldmann visual field; OCT optical coherence tomography; IOP intraocular pressure; BRVO Branch retinal vein occlusion; CRVO central retinal vein occlusion; CRAO central retinal artery occlusion; BRAO branch retinal artery occlusion; RT retinal tear; SB scleral buckle; PPV pars  plana vitrectomy; VH Vitreous hemorrhage; PRP panretinal laser photocoagulation; IVK intravitreal kenalog; VMT vitreomacular traction; MH Macular hole;  NVD neovascularization of the disc; NVE neovascularization elsewhere; AREDS age related eye disease study; ARMD age related macular degeneration; POAG primary open angle glaucoma; EBMD epithelial/anterior basement membrane dystrophy; ACIOL anterior chamber intraocular lens; IOL intraocular lens; PCIOL posterior chamber intraocular lens; Phaco/IOL phacoemulsification with intraocular lens placement; [place] photorefractive keratectomy; LASIK laser assisted in situ keratomileusis; HTN hypertension; DM diabetes mellitus; COPD chronic obstructive pulmonary disease

## 2019-11-28 ENCOUNTER — Emergency Department (HOSPITAL_BASED_OUTPATIENT_CLINIC_OR_DEPARTMENT_OTHER)
Admission: EM | Admit: 2019-11-28 | Discharge: 2019-11-28 | Disposition: A | Discharge status: Home / Self Care | Payer: Medicare Other | Attending: Emergency Medicine | Admitting: Emergency Medicine

## 2019-11-28 ENCOUNTER — Other Ambulatory Visit: Payer: Self-pay

## 2019-11-28 ENCOUNTER — Encounter (HOSPITAL_BASED_OUTPATIENT_CLINIC_OR_DEPARTMENT_OTHER): Payer: Self-pay | Admitting: Emergency Medicine

## 2019-11-28 DIAGNOSIS — Z5189 Encounter for other specified aftercare: Secondary | ICD-10-CM

## 2019-11-28 DIAGNOSIS — Z794 Long term (current) use of insulin: Secondary | ICD-10-CM | POA: Insufficient documentation

## 2019-11-28 DIAGNOSIS — X58XXXA Exposure to other specified factors, initial encounter: Secondary | ICD-10-CM | POA: Insufficient documentation

## 2019-11-28 DIAGNOSIS — I5032 Chronic diastolic (congestive) heart failure: Secondary | ICD-10-CM | POA: Diagnosis not present

## 2019-11-28 DIAGNOSIS — Z79899 Other long term (current) drug therapy: Secondary | ICD-10-CM | POA: Diagnosis not present

## 2019-11-28 DIAGNOSIS — I252 Old myocardial infarction: Secondary | ICD-10-CM | POA: Insufficient documentation

## 2019-11-28 DIAGNOSIS — Y929 Unspecified place or not applicable: Secondary | ICD-10-CM | POA: Diagnosis not present

## 2019-11-28 DIAGNOSIS — Z87891 Personal history of nicotine dependence: Secondary | ICD-10-CM | POA: Diagnosis not present

## 2019-11-28 DIAGNOSIS — Y939 Activity, unspecified: Secondary | ICD-10-CM | POA: Insufficient documentation

## 2019-11-28 DIAGNOSIS — I110 Hypertensive heart disease with heart failure: Secondary | ICD-10-CM | POA: Insufficient documentation

## 2019-11-28 DIAGNOSIS — S51812A Laceration without foreign body of left forearm, initial encounter: Secondary | ICD-10-CM | POA: Diagnosis not present

## 2019-11-28 DIAGNOSIS — E119 Type 2 diabetes mellitus without complications: Secondary | ICD-10-CM | POA: Diagnosis not present

## 2019-11-28 DIAGNOSIS — Y999 Unspecified external cause status: Secondary | ICD-10-CM | POA: Diagnosis not present

## 2019-11-28 DIAGNOSIS — E782 Mixed hyperlipidemia: Secondary | ICD-10-CM | POA: Insufficient documentation

## 2019-11-28 NOTE — ED Notes (Signed)
Skin tear left forearm, area cleansed with NS, bacitracin applied followed by non-adherent dressing and coban wrap.  Pt instructed on home wound care and verbalizes understanding.  Denies any other injury from fall.

## 2019-11-28 NOTE — ED Triage Notes (Addendum)
Reports a skin tear to L forearm yesterday. Doesn't know what he hit it on. Bandage in place. Pt takes blood thinners

## 2019-11-29 ENCOUNTER — Encounter (INDEPENDENT_AMBULATORY_CARE_PROVIDER_SITE_OTHER): Payer: Self-pay | Admitting: Ophthalmology

## 2019-11-29 ENCOUNTER — Ambulatory Visit (INDEPENDENT_AMBULATORY_CARE_PROVIDER_SITE_OTHER): Payer: Medicare Other | Admitting: Ophthalmology

## 2019-11-29 DIAGNOSIS — H3581 Retinal edema: Secondary | ICD-10-CM | POA: Diagnosis not present

## 2019-11-29 DIAGNOSIS — Z961 Presence of intraocular lens: Secondary | ICD-10-CM

## 2019-11-29 DIAGNOSIS — H35033 Hypertensive retinopathy, bilateral: Secondary | ICD-10-CM

## 2019-11-29 DIAGNOSIS — E119 Type 2 diabetes mellitus without complications: Secondary | ICD-10-CM

## 2019-11-29 DIAGNOSIS — H0014 Chalazion left upper eyelid: Secondary | ICD-10-CM

## 2019-11-29 DIAGNOSIS — H2701 Aphakia, right eye: Secondary | ICD-10-CM

## 2019-11-29 DIAGNOSIS — H35411 Lattice degeneration of retina, right eye: Secondary | ICD-10-CM

## 2019-11-29 DIAGNOSIS — Z9889 Other specified postprocedural states: Secondary | ICD-10-CM

## 2019-11-29 DIAGNOSIS — I10 Essential (primary) hypertension: Secondary | ICD-10-CM

## 2019-11-29 DIAGNOSIS — H02886 Meibomian gland dysfunction of left eye, unspecified eyelid: Secondary | ICD-10-CM

## 2019-11-29 DIAGNOSIS — H3321 Serous retinal detachment, right eye: Secondary | ICD-10-CM

## 2019-11-29 DIAGNOSIS — T8522XS Displacement of intraocular lens, sequela: Secondary | ICD-10-CM

## 2019-11-29 DIAGNOSIS — H02883 Meibomian gland dysfunction of right eye, unspecified eyelid: Secondary | ICD-10-CM

## 2019-11-29 MED ORDER — PROLENSA 0.07 % OP SOLN: 1.0000 [drp] | Freq: Four times a day (QID) | OPHTHALMIC | 2 refills | Status: DC

## 2019-11-29 MED ORDER — GATIFLOXACIN 0.5 % OP SOLN: 1.0000 [drp] | Freq: Four times a day (QID) | OPHTHALMIC | 0 refills | Status: AC

## 2019-11-30 NOTE — ED Provider Notes (Signed)
[facility] EMERGENCY DEPARTMENT Provider Note   CSN: [id number] Arrival date & time: 11/28/19  1531     History Chief Complaint  Patient presents with  . Arm Injury    Cody Phelps is a 79 y.o. male.  HPI     79 yo male with history below on anticoagulation presents with concern for wound to the left forearm.  Yesterday suffered a skin tear to the left forearm. Placed hydrogen peroxide on it, abx ointment and pressure bandage.  Area will occasionally still ooze blood.  Came because thought may need to debride/remove upper layer of skin from skin tear to prevent infection and desire to prevent infection. Denies other injuries or concerns.     Past Medical History:  Diagnosis Date  . Acute anterior wall MI ([place]) 03/01/2018  . Acute combined systolic and diastolic heart failure ([place]) 03/15/2018  . Bronchitis, acute 05/04/2012  . Burn   . Carotid stenosis, left followed by dr [name], cardiologist--- bilateral bruit per note   per dr [name] note [id number]  LICA [id number] stenosis per duplex 05-14-2014  . Chronic diastolic CHF (congestive heart failure) ([place]) 09/21/2018  . Dementia ([place])   . Diverticulosis   . DM (diabetes mellitus) type 2, uncontrolled, with ketoacidosis ([place]) 11/25/2013  . ED (erectile dysfunction)   . First degree heart block   . GERD (gastroesophageal reflux disease)   . Heart murmur   . History of blood transfusion    with open heart surgery  . Hypertension    cardiologsit-  dr [name]  . Hypertensive retinopathy    OU  . Iron deficiency anemia   . Mixed hyperlipidemia   . Morbid obesity ([place])   . Myocardial infarction ([place])    02/28/18  . OA (osteoarthritis)    right hip  . OSA on CPAP    followed by dr dohmeier, uses CPAP  . PAF (paroxysmal atrial fibrillation) ([name])    a. on Eliquis  . Prostate cancer ([place])    dx 03-17-2017 (bx)  Stage T2a, Gleason 4+4, PSA  4.43, vol 32.32--  s/p radiatctive prostate seed implants 07-10-2017 then IMRT and ADT    . Retinal detachment    Rheg. RD OS  . RLS (restless legs syndrome)   . S/P aortic valve replacement with bioprosthetic valve 05/30/2018   23 mm Edwards Inspiris Resilia stented bovine pericardial tissue valve  . 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 S/P Maze operation for atrial fibrillation 05/30/2018   Complete bilateral atrial lesion set using bipolar radiofrequency and cryothermy ablation with clipping of LA appendage  . Scoliosis   . Severe aortic stenosis   . Trigger finger   . Type 2 diabetes mellitus ([place])   . Wears partial dentures    upper and lower    Patient Active Problem List   Diagnosis Date Noted  . Chronic diastolic CHF (congestive heart failure) ([place]) 09/21/2018  . S/P aortic valve replacement with bioprosthetic valve 05/30/2018  . S/P CABG x 1 05/30/2018  . S/P Maze operation for atrial fibrillation 05/30/2018  . Anemia, normocytic normochromic 05/21/2018  . Coronary artery disease   . 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 Pericardial effusion 03/20/2018  . Paroxysmal atrial fibrillation ([place]) 03/05/2018  . Acute MI anterior wall first episode care ([place]) 03/01/2018  . Primary osteoarthritis of right hip 10/23/2017  . Osteoarthritis of right hip 10/18/2017  . Malignant neoplasm of prostate ([place]) 04/21/2017  . RLS (restless  legs syndrome) 11/20/2014  . Lumbar stenosis with neurogenic claudication 02/12/2014  . Obstructive sleep apnea 11/25/2013  . HTN (hypertension) 11/25/2013  . Lymphadenopathy 05/04/2012  . OTHER CHRONIC SINUSITIS 08/08/2008  . FOOT PAIN, RIGHT 06/10/2008  . Type II diabetes mellitus ([place]) 08/31/2006  . Essential hypertension 08/31/2006  . DIVERTICULOSIS, COLON 08/31/2006  . LEG CRAMPS 08/31/2006    Past Surgical History:  Procedure Laterality Date  . AORTIC VALVE REPLACEMENT N/A 05/30/2018   Procedure: AORTIC VALVE REPLACEMENT (AVR) using Inspiris Valve, Size 23;  Surgeon: [name], MD;  Location: [facility];   Service: Open Heart Surgery;  Laterality: N/A;  . APPENDECTOMY  1988  . BILATERAL TOTAL ETHMOIDECTOMY AND SPHENOIDECTOMY  01-14-2009    dr bates   [facility]  . CARDIOVERSION N/A 08/10/2018   Procedure: CARDIOVERSION;  Surgeon: Adrian Prows, MD;  Location: [facility];  Service: Cardiovascular;  Laterality: N/A;  . CARPAL TUNNEL RELEASE Bilateral 2013  . CATARACT EXTRACTION Bilateral    Dr. [name]  . CATARACT EXTRACTION W/ INTRAOCULAR LENS  IMPLANT, BILATERAL  date?  . CORONARY ARTERY BYPASS GRAFT N/A 05/30/2018   Procedure: CORONARY ARTERY BYPASS GRAFTING (CABG) x one using left internal mammary artery;  Surgeon: [name], MD;  Location: [facility];  Service: Open Heart Surgery;  Laterality: N/A;  . CORONARY BALLOON ANGIOPLASTY N/A 03/01/2018   Procedure: CORONARY BALLOON ANGIOPLASTY;  Surgeon: Adrian Prows, MD;  Location: [facility] CV LAB;  Service: Cardiovascular;  Laterality: N/A;  . CORONARY/GRAFT ACUTE MI REVASCULARIZATION N/A 03/01/2018   Procedure: Coronary/Graft Acute MI Revascularization;  Surgeon: Adrian Prows, MD;  Location: [facility] CV LAB;  Service: Cardiovascular;  Laterality: N/A;  . CYSTOSCOPY  07/19/2017   Procedure: CYSTOSCOPY;  Surgeon: Nickie Retort, MD;  Location: [facility];  Service: Urology;;  No seeds found in [place]  . EYE SURGERY Bilateral    Cat Sx OU.  Dislocated IOL sx OD.  Rheg. RD repair OS.  Marica Otter EXCHANGE Right 06/13/2019   Procedure: GAS/FLUID EXCHANGE;  Surgeon: [name], MD;  Location: [facility];  Service: Ophthalmology;  Laterality: Right;  . LAMINECTOMY WITH POSTERIOR LATERAL ARTHRODESIS LEVEL 2 N/A 02/12/2014   Procedure: LUMBAR TWO-THREE,LUIMBAR THREE-FOUR LAMINECTOMY/FORAMINOTOMY;POSSIBLE POSTEROLATERAL ARTHRODESIS WITH AUTOGRAFT;  Surgeon: [name], MD;  Location: [facility];  Service: Neurosurgery;  Laterality: N/A;  . LEFT HEART CATH AND CORONARY ANGIOGRAPHY N/A 03/01/2018   Procedure: LEFT HEART CATH AND CORONARY  ANGIOGRAPHY;  Surgeon: Adrian Prows, MD;  Location: [facility] CV LAB;  Service: Cardiovascular;  Laterality: N/A;  . MAZE N/A 05/30/2018   Procedure: MAZE;  Surgeon: [name], MD;  Location: [facility];  Service: Open Heart Surgery;  Laterality: N/A;  . ORIF RIGHT ANKLE FX'S  05/05/2001   retained hardware  . PARS PLANA VITRECTOMY Right 06/13/2019   Procedure: PARS PLANA VITRECTOMY WITH 25 GAUGE WITH LASER AND GAS;  Surgeon: [name], MD;  Location: [facility];  Service: Ophthalmology;  Laterality: Right;  . PARS PLANA VITRECTOMY Right 09/12/2019   Procedure: [place] Vitrectomy With 25 Gauge;  Surgeon: [name], MD;  Location: [facility];  Service: Ophthalmology;  Laterality: Right;  . [place] INJECTION Right 06/13/2019   Procedure: PERFLUORONE INJECTION;  Surgeon: [name], MD;  Location: [facility];  Service: Ophthalmology;  Laterality: Right;  . PHOTOCOAGULATION WITH LASER Right 06/13/2019   Procedure: PHOTOCOAGULATION WITH LASER;  Surgeon: [name], MD;  Location: [facility];  Service: Ophthalmology;  Laterality: Right;  .  PLACEMENT AND SUTURE OF SECONDARY INTRAOCULAR LENS Right 09/12/2019   Procedure: PLACEMENT AND SUTURE OF SECONDARY INTRAOCULAR LENS;  Surgeon: [name], MD;  Location: [facility];  Service: Ophthalmology;  Laterality: Right;  . PROSTATE BIOPSY  03-17-2017   dr Pilar Jarvis office  . RADIOACTIVE SEED IMPLANT N/A 07/19/2017   Procedure: RADIOACTIVE SEED IMPLANT/BRACHYTHERAPY IMPLANT;  Surgeon: Nickie Retort, MD;  Location[facility];  Service: Urology;  Laterality: N/A;  69 seeds implanted  . REMOVAL RETAINED LENS Right 09/12/2019   Procedure: REMOVAL RETAINED LENS;  Surgeon: [name], MD;  Location: [facility];  Service: Ophthalmology;  Laterality: Right;  . RETINAL DETACHMENT SURGERY Right 06/13/2019   Dr. Coralyn Pear  . SKIN GRAFT     face  . SPACE OAR INSTILLATION N/A 07/19/2017   Procedure: SPACE OAR INSTILLATION;  Surgeon: Nickie Retort,  MD;  Location: [facility];  Service: Urology;  Laterality: N/A;  . TEE WITHOUT CARDIOVERSION  03/20/2012   Procedure: TRANSESOPHAGEAL ECHOCARDIOGRAM (TEE);  Surgeon: Laverda Page, MD;  Location: [facility];  Service: Cardiovascular;  Laterality: N/A;  normal LV; normal EF; normal RV; normal LA w/ left atrial appendage very small, normal function, interatrial septum intact without defect; normal RA; trace MR,TR, & PI; mild AV calcification and senile degeneration w/ mild stenosis, AVA 1.7cm^2;;   . TEE WITHOUT CARDIOVERSION N/A 05/30/2018   Procedure: TRANSESOPHAGEAL ECHOCARDIOGRAM (TEE);  Surgeon: [name], MD;  Location: [facility];  Service: Open Heart Surgery;  Laterality: N/A;  . TOTAL HIP ARTHROPLASTY Right 10/23/2017   Procedure: TOTAL HIP ARTHROPLASTY ANTERIOR APPROACH;  Surgeon: Frederik Pear, MD;  Location: [facility];  Service: Orthopedics;  Laterality: Right;  . TRANSTHORACIC ECHOCARDIOGRAM  01-11-2017   dr [name]  (per echo note, no significant change in seveity of AS, no other diagnostic change)   moderate concentric LVH, ef [id number], grade 1 diastolic dysfunction/  moderate LAE/  mild , grade 1 AR w/ moderate AV calcification, mild to moderate restricted AV leaflets w/ moderate AS, AVA 1.16cm^2, peak grandient 24mmHg, mean grandient 67mmHg/  trace MR, mild calcification MV annulus , mild MV leaflet calcification, mild MVS, peak grandient 4.14mmHg, mean grandient 2.5mmHg/ trace TR       Family History  Problem Relation Age of Onset  . Stroke Mother   . Hypertension Mother   . Heart attack Father   . Heart murmur Father   . Hypertension Sister   . Colon cancer Neg Hx   . Stomach cancer Neg Hx   . Rectal cancer Neg Hx   . Pancreatic cancer Neg Hx     Social History   Tobacco Use  . Smoking status: Former Smoker    Years: 1.00    Types: Cigarettes    Quit date: 02/04/1967    Years since quitting: 52.8  . Smokeless tobacco: Never Used  . Tobacco comment: smoked 1  q2-3 days  Substance Use Topics  . Alcohol use: Yes    Comment: occasional beer  . Drug use: No    Home Medications Prior to Admission medications   Medication Sig Start Date End Date Taking? Authorizing Provider  amiodarone (PACERONE) 200 MG tablet TAKE 1 TABLET BY MOUTH  DAILY Patient taking differently: Take 200 mg by mouth daily.  06/21/19   Adrian Prows, MD  atorvastatin (LIPITOR) 80 MG tablet TAKE 1 TABLET BY MOUTH  DAILY 10/04/19   Adrian Prows, MD  Bromfenac Sodium (PROLENSA) 0.07 % SOLN Place 1 drop into  the right eye 4 (four) times daily. 11/29/19   [name], MD  colesevelam [facility]) 625 MG tablet Take 625 mg by mouth 3 (three) times daily.     [provider]  donepezil (ARICEPT) 10 MG tablet Take 10 mg by mouth every morning.  03/19/12   [provider]  dorzolamide-timolol (COSOPT) 22.3-6.8 MG/ML ophthalmic solution Place 1 drop into the right eye 2 (two) times daily. 06/21/19   [name], MD  esomeprazole (NEXIUM) 40 MG capsule Take 40 mg by mouth daily.  06/18/19   [provider]  ferrous sulfate 325 (65 FE) MG tablet Take 325 mg by mouth 3 (three) times daily with meals.    [provider]  gabapentin (NEURONTIN) 300 MG capsule Take 300 mg by mouth 2 (two) times daily.  06/14/19   [provider]  gatifloxacin (ZYMAXID) 0.5 % SOLN Place 1 drop into the left eye 4 (four) times daily for 5 days. 11/29/19 12/04/19  [name], MD  insulin glargine (LANTUS SOLOSTAR) 100 UNIT/ML injection Inject 20-40 Units into the skin at bedtime as needed (BGL below 140-150 take 20 units, if 151 or higher take 40 units).     [provider]  memantine (NAMENDA) 5 MG tablet Take 5 mg by mouth 2 (two) times daily.     [provider]  metoprolol tartrate (LOPRESSOR) 25 MG tablet Take 1 tablet (25 mg total) by mouth daily. 10/04/19   Adrian Prows, MD  mirabegron ER (MYRBETRIQ) 50 MG TB24 tablet Take 50 mg by mouth at bedtime.      [provider]  omega-3 acid ethyl esters (LOVAZA) 1 G capsule Take 2 g by mouth daily.     [provider]  prednisoLONE acetate (PRED FORTE) 1 % ophthalmic suspension Place 1 drop into the right eye every 2 (two) hours while awake. 09/20/19   [name], MD  rOPINIRole (REQUIP) 2 MG tablet Take 2 mg by mouth 3 (three) times daily as needed (RLS).     [provider]  spironolactone (ALDACTONE) 25 MG tablet Take 0.5 tablets (12.5 mg total) by mouth daily. 10/04/19   Adrian Prows, MD  tamsulosin (FLOMAX) 0.4 MG CAPS capsule Take 0.4 mg by mouth daily after supper.     [provider]  torsemide (DEMADEX) 20 MG tablet Take 1 tablet (20 mg total) by mouth 2 (two) times daily as needed. 10/29/19 11/28/19  Patwardhan, [name], MD  valsartan-hydrochlorothiazide (DIOVAN-HCT) 80-12.5 MG tablet TAKE 1 TABLET BY MOUTH IN  THE MORNING Patient taking differently: Take 1 tablet by mouth every morning.  06/21/19   Adrian Prows, MD  vitamin B-12 (CYANOCOBALAMIN) 1000 MCG tablet Take 1,000 mcg by mouth 2 (two) times daily.    [provider]  XARELTO 15 MG TABS tablet TAKE 1 TABLET BY MOUTH  EVERY EVENING AFTER DINNER Patient taking differently: Take 15 mg by mouth daily with supper.  06/21/19   Adrian Prows, MD    Allergies    Patient has no known allergies.  Review of Systems   Review of Systems  Respiratory: Negative for shortness of breath.   Cardiovascular: Negative for chest pain.  Skin: Positive for wound.  Neurological: Negative for headaches.    Physical Exam Updated Vital Signs BP (!) 149/64 (BP Location: Left Arm)   Pulse (!) 56   Temp 98.1 F (36.7 C) (Oral)   Resp 18   Ht 5\' 8"  (1.727 m)   Wt 100.7 kg  SpO2 98%   BMI 33.75 kg/m   Physical Exam Vitals and nursing note reviewed.  Constitutional:      General: He is not in acute distress.    Appearance: Normal appearance. He is not ill-appearing, toxic-appearing or diaphoretic.  HENT:      Head: Normocephalic.  Eyes:     Conjunctiva/sclera: Conjunctivae normal.  Cardiovascular:     Rate and Rhythm: Normal rate and regular rhythm.     Pulses: Normal pulses.  Pulmonary:     Effort: Pulmonary effort is normal. No respiratory distress.  Musculoskeletal:        General: No deformity or signs of injury.     Cervical back: No rigidity.  Skin:    General: Skin is warm and dry.     Coloration: Skin is not jaundiced or pale.     Findings: Lesion (area of skin tear dorsal left forearm, 60mm area open, skin flap adhered to other area, likely 1cm) present.  Neurological:     General: No focal deficit present.     Mental Status: He is alert and oriented to person, place, and time.     ED Results / Procedures / Treatments   Labs (all labs ordered are listed, but only abnormal results are displayed) Labs Reviewed - No data to display  EKG None  Radiology OCT, Retina - OU - Both Eyes  Result Date: 11/29/2019 Right Eye Quality was good. Central Foveal Thickness: 296. Progression has worsened. Findings include normal foveal contour, no SRF, intraretinal fluid (Interval increase in cystic changes). Left Eye Quality was good. Central Foveal Thickness: 262. Progression has been stable. Findings include normal foveal contour, no IRF, no SRF. Notes *Images captured and stored on drive Diagnosis / Impression: OD: Retina re-attached; patchy ellipsoid signal; interval increase in cystic changes OS: NFP, no IRF/SRF Clinical management: See below Abbreviations: NFP - Normal foveal profile. CME - cystoid macular edema. PED - pigment epithelial detachment. IRF - intraretinal fluid. SRF - subretinal fluid. EZ - ellipsoid zone. ERM - epiretinal membrane. ORA - outer retinal atrophy. ORT - outer retinal tubulation. SRHM - subretinal hyper-reflective material    Procedures Procedures (including critical care time)  Medications Ordered in ED Medications - No data to display  ED Course  I have  reviewed the triage vital signs and the nursing notes.  Pertinent labs & imaging results that were available during my care of the patient were reviewed by me and considered in my medical decision making (see chart for details).    MDM Rules/Calculators/A&P                      79 yo male with history below on anticoagulation presents with concern for wound to the left forearm. Denies other injuries.  Area cleaned, is hemostatic. Discussed I would not recommend removing the current skin flap which is well adhered, acting as natural bandage.  No signs of infection. Recommend continued wound care with bacitracin and nonstick dressing and discussed reasons to return.   Final Clinical Impression(s) / ED Diagnoses Final diagnoses:  Encounter for wound care    Rx / DC Orders ED Discharge Orders    None       Gareth Morgan, MD 11/30/19 1419

## 2019-12-01 ENCOUNTER — Encounter (INDEPENDENT_AMBULATORY_CARE_PROVIDER_SITE_OTHER): Payer: Self-pay | Admitting: Ophthalmology

## 2019-12-08 ENCOUNTER — Emergency Department (HOSPITAL_BASED_OUTPATIENT_CLINIC_OR_DEPARTMENT_OTHER): Payer: Medicare Other

## 2019-12-08 ENCOUNTER — Emergency Department (HOSPITAL_BASED_OUTPATIENT_CLINIC_OR_DEPARTMENT_OTHER)
Admission: EM | Admit: 2019-12-08 | Discharge: 2019-12-08 | Disposition: A | Discharge status: Home / Self Care | Payer: Medicare Other | Attending: Emergency Medicine | Admitting: Emergency Medicine

## 2019-12-08 ENCOUNTER — Other Ambulatory Visit: Payer: Self-pay

## 2019-12-08 ENCOUNTER — Encounter (HOSPITAL_BASED_OUTPATIENT_CLINIC_OR_DEPARTMENT_OTHER): Payer: Self-pay

## 2019-12-08 DIAGNOSIS — S0081XA Abrasion of other part of head, initial encounter: Secondary | ICD-10-CM | POA: Insufficient documentation

## 2019-12-08 DIAGNOSIS — Z794 Long term (current) use of insulin: Secondary | ICD-10-CM | POA: Diagnosis not present

## 2019-12-08 DIAGNOSIS — Y999 Unspecified external cause status: Secondary | ICD-10-CM | POA: Diagnosis not present

## 2019-12-08 DIAGNOSIS — Z87891 Personal history of nicotine dependence: Secondary | ICD-10-CM | POA: Insufficient documentation

## 2019-12-08 DIAGNOSIS — Z79899 Other long term (current) drug therapy: Secondary | ICD-10-CM | POA: Insufficient documentation

## 2019-12-08 DIAGNOSIS — W01198A Fall on same level from slipping, tripping and stumbling with subsequent striking against other object, initial encounter: Secondary | ICD-10-CM | POA: Diagnosis not present

## 2019-12-08 DIAGNOSIS — Y92007 Garden or yard of unspecified non-institutional (private) residence as the place of occurrence of the external cause: Secondary | ICD-10-CM | POA: Diagnosis not present

## 2019-12-08 DIAGNOSIS — W19XXXA Unspecified fall, initial encounter: Secondary | ICD-10-CM

## 2019-12-08 DIAGNOSIS — I110 Hypertensive heart disease with heart failure: Secondary | ICD-10-CM | POA: Insufficient documentation

## 2019-12-08 DIAGNOSIS — R410 Disorientation, unspecified: Secondary | ICD-10-CM | POA: Diagnosis present

## 2019-12-08 DIAGNOSIS — M25511 Pain in right shoulder: Secondary | ICD-10-CM | POA: Diagnosis not present

## 2019-12-08 DIAGNOSIS — Y9389 Activity, other specified: Secondary | ICD-10-CM | POA: Diagnosis not present

## 2019-12-08 DIAGNOSIS — S41112A Laceration without foreign body of left upper arm, initial encounter: Secondary | ICD-10-CM | POA: Diagnosis not present

## 2019-12-08 DIAGNOSIS — I5032 Chronic diastolic (congestive) heart failure: Secondary | ICD-10-CM | POA: Diagnosis not present

## 2019-12-08 DIAGNOSIS — E119 Type 2 diabetes mellitus without complications: Secondary | ICD-10-CM | POA: Insufficient documentation

## 2019-12-08 DIAGNOSIS — T148XXA Other injury of unspecified body region, initial encounter: Secondary | ICD-10-CM

## 2019-12-08 MED ORDER — LIDOCAINE 5 % EX PTCH: 1.0000 | MEDICATED_PATCH | CUTANEOUS | 0 refills | Status: DC

## 2019-12-08 NOTE — ED Provider Notes (Signed)
Triage [facility] EMERGENCY DEPARTMENT Provider Note   CSN: [id number] Arrival date & time: 12/08/19  1425    History Chief Complaint  Patient presents with   Cody Phelps is a 79 y.o. male with past medical history significant for MI, heart failure, dementia, diabetes, A. fib anticoagulated on Eliquis, hypertension, retinal detachment presents for evaluation of mechanical fall.  Patient was in the yard went to step over a hose when he fell.  He hit the left portion of his head and has subsequent hematoma.  Patient with skin tear to his left mid forearm.  Last tetanus in 2009.  Patient also with right shoulder pain.  Triage note states patient and wife had noted some transient confusion and slurring speech however she denies this on my evaluation.  States he has had normal mentation.  Did have a slip and fall 2 weeks ago in the shower.  Seen here in the ED and DC home.  Patient denies headache, lightheadedness, dizziness, vision changes, slurred speech, difficulty with word finding, unilateral weakness, chest pain, shortness of breath, abdominal pain, diarrhea, dysuria.  Right shoulder pain worse with movement.  Denies aggravating or alleviating factors.  Denies preceding chest pain, sudden onset thunderclap headache, weakness prior to fall.  History obtained from patient and past medical records.  No interpreter is used.  Patient initially seen in triage by Dr. Karle Starch  HPI     Past Medical History:  Diagnosis Date   Acute anterior wall MI ([place]) 03/01/2018   Acute combined systolic and diastolic heart failure ([place]) 03/15/2018   Bronchitis, acute 05/04/2012   Burn    Carotid stenosis, left followed by dr [name], cardiologist--- bilateral bruit per note   per dr [name] note [id number]  LICA 0000000 stenosis per duplex 05-14-2014   Chronic diastolic CHF (congestive heart failure) ([place]) 09/21/2018   Dementia ([place])    Diverticulosis    DM (diabetes mellitus) type 2,  uncontrolled, with ketoacidosis ([place]) 11/25/2013   ED (erectile dysfunction)    First degree heart block    GERD (gastroesophageal reflux disease)    Heart murmur    History of blood transfusion    with open heart surgery   Hypertension    cardiologsit-  dr [name]   Hypertensive retinopathy    OU   Iron deficiency anemia    Mixed hyperlipidemia    Morbid obesity ([place])    Myocardial infarction ([place])    02/28/18   OA (osteoarthritis)    right hip   OSA on CPAP    followed by dr dohmeier, uses CPAP   PAF (paroxysmal atrial fibrillation) ([place])    a. on Eliquis   Prostate cancer ([place])    dx 03-17-2017 (bx)  Stage T2a, Gleason 4+4, PSA  4.43, vol 32.32--  s/p radiatctive prostate seed implants 07-10-2017 then IMRT and ADT   Retinal detachment    Rheg. 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   LIMA to Diagonal Branch   S/P Maze operation for atrial fibrillation 05/30/2018   Complete bilateral atrial lesion set using bipolar radiofrequency and cryothermy ablation with clipping of LA appendage   Scoliosis    Severe aortic stenosis    Trigger finger    Type 2 diabetes mellitus ([place])    Wears partial dentures  upper and lower    Patient Active Problem List   Diagnosis Date Noted   Chronic diastolic CHF (congestive heart failure) ([place]) 09/21/2018   S/P aortic valve replacement with bioprosthetic valve 05/30/2018   S/P CABG x 1 05/30/2018   S/P Maze operation for atrial fibrillation 05/30/2018   Anemia, normocytic normochromic 05/21/2018   Coronary artery disease    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   Pericardial effusion 03/20/2018   Paroxysmal atrial fibrillation ([place]) 03/05/2018   Acute MI anterior wall first episode care ([place]) 03/01/2018   Primary osteoarthritis of right  hip 10/23/2017   Osteoarthritis of right hip 10/18/2017   Malignant neoplasm of prostate ([place]) 04/21/2017   RLS (restless legs syndrome) 11/20/2014   Lumbar stenosis with neurogenic claudication 02/12/2014   Obstructive sleep apnea 11/25/2013   HTN (hypertension) 11/25/2013   Lymphadenopathy 05/04/2012   OTHER CHRONIC SINUSITIS 08/08/2008   FOOT PAIN, RIGHT 06/10/2008   Type II diabetes mellitus ([place]) 08/31/2006   Essential hypertension 08/31/2006   DIVERTICULOSIS, COLON 08/31/2006   LEG CRAMPS 08/31/2006    Past Surgical History:  Procedure Laterality Date   AORTIC VALVE REPLACEMENT N/A 05/30/2018   Procedure: AORTIC VALVE REPLACEMENT (AVR) using Inspiris Valve, Size 23;  Surgeon: [name], MD;  Location: [facility];  Service: Open Heart Surgery;  Laterality: N/A;   APPENDECTOMY  1988   BILATERAL TOTAL ETHMOIDECTOMY AND SPHENOIDECTOMY  01-14-2009    dr Redmond Baseman   [facility]   CARDIOVERSION N/A 08/10/2018   Procedure: CARDIOVERSION;  Surgeon: Adrian Prows, MD;  Location: [facility];  Service: Cardiovascular;  Laterality: N/A;   CARPAL TUNNEL RELEASE Bilateral 2013   CATARACT EXTRACTION Bilateral    Dr. [name]   CATARACT EXTRACTION W/ INTRAOCULAR LENS  IMPLANT, BILATERAL  date?   CORONARY ARTERY BYPASS GRAFT N/A 05/30/2018   Procedure: CORONARY ARTERY BYPASS GRAFTING (CABG) x one using left internal mammary artery;  Surgeon: [name], MD;  Location: [facility];  Service: Open Heart Surgery;  Laterality: N/A;   CORONARY BALLOON ANGIOPLASTY N/A 03/01/2018   Procedure: CORONARY BALLOON ANGIOPLASTY;  Surgeon: Adrian Prows, MD;  Location: [facility] CV LAB;  Service: Cardiovascular;  Laterality: N/A;   CORONARY/GRAFT ACUTE MI REVASCULARIZATION N/A 03/01/2018   Procedure: Coronary/Graft Acute MI Revascularization;  Surgeon: Adrian Prows, MD;  Location: [facility] CV LAB;  Service: Cardiovascular;  Laterality: N/A;   CYSTOSCOPY  07/19/2017   Procedure: CYSTOSCOPY;  Surgeon:  Nickie Retort, MD;  Location: [facility];  Service: Urology;;  No seeds found in [place] Bilateral    Cat Sx OU.  Dislocated IOL sx OD.  Rheg. RD repair OS.   GAS/FLUID EXCHANGE Right 06/13/2019   Procedure: GAS/FLUID EXCHANGE;  Surgeon: [name], MD;  Location: [facility];  Service: Ophthalmology;  Laterality: Right;   LAMINECTOMY WITH POSTERIOR LATERAL ARTHRODESIS LEVEL 2 N/A 02/12/2014   Procedure: LUMBAR TWO-THREE,LUIMBAR THREE-FOUR LAMINECTOMY/FORAMINOTOMY;POSSIBLE POSTEROLATERAL ARTHRODESIS WITH AUTOGRAFT;  Surgeon: [name], MD;  Location: [facility];  Service: Neurosurgery;  Laterality: N/A;   LEFT HEART CATH AND CORONARY ANGIOGRAPHY N/A 03/01/2018   Procedure: LEFT HEART CATH AND CORONARY ANGIOGRAPHY;  Surgeon: Adrian Prows, MD;  Location: [facility];  Service: Cardiovascular;  Laterality: N/A;   MAZE N/A 05/30/2018   Procedure: MAZE;  Surgeon: [name], MD;  Location: [facility];  Service: Open Heart Surgery;  Laterality: N/A;   ORIF RIGHT ANKLE FX'S  05/05/2001   retained hardware  PARS PLANA VITRECTOMY Right 06/13/2019   Procedure: PARS PLANA VITRECTOMY WITH 25 GAUGE WITH LASER AND GAS;  Surgeon: [name], MD;  Location: [facility];  Service: Ophthalmology;  Laterality: Right;   PARS PLANA VITRECTOMY Right 09/12/2019   Procedure: Pars Plana Vitrectomy With 25 Gauge;  Surgeon: [name], MD;  Location: [facility];  Service: Ophthalmology;  Laterality: Right;   PERFLUORONE INJECTION Right 06/13/2019   Procedure: PERFLUORONE INJECTION;  Surgeon: [name], MD;  Location: [facility];  Service: Ophthalmology;  Laterality: Right;   PHOTOCOAGULATION WITH LASER Right 06/13/2019   Procedure: PHOTOCOAGULATION WITH LASER;  Surgeon: [name], MD;  Location: [facility];  Service: Ophthalmology;  Laterality: Right;   PLACEMENT AND SUTURE OF SECONDARY INTRAOCULAR LENS Right 09/12/2019   Procedure: PLACEMENT AND SUTURE OF SECONDARY  INTRAOCULAR LENS;  Surgeon: [name], MD;  Location: [facility];  Service: Ophthalmology;  Laterality: Right;   PROSTATE BIOPSY  03-17-2017   dr Pilar Jarvis office   RADIOACTIVE SEED IMPLANT N/A 07/19/2017   Procedure: RADIOACTIVE SEED IMPLANT/BRACHYTHERAPY IMPLANT;  Surgeon: Nickie Retort, MD;  Location[facility];  Service: Urology;  Laterality: N/A;  69 seeds implanted   REMOVAL RETAINED LENS Right 09/12/2019   Procedure: REMOVAL RETAINED LENS;  Surgeon: [name], MD;  Location: [facility];  Service: Ophthalmology;  Laterality: Right;   RETINAL DETACHMENT SURGERY Right 06/13/2019   Dr. Coralyn Pear   SKIN GRAFT     face   SPACE OAR INSTILLATION N/A 07/19/2017   Procedure: SPACE OAR INSTILLATION;  Surgeon: Nickie Retort, MD;  Location: [facility];  Service: Urology;  Laterality: N/A;   TEE WITHOUT CARDIOVERSION  03/20/2012   Procedure: TRANSESOPHAGEAL ECHOCARDIOGRAM (TEE);  Surgeon: Laverda Page, MD;  [facility];  Service: Cardiovascular;  Laterality: N/A;  normal LV; normal EF; normal RV; normal LA w/ left atrial appendage very small, normal function, interatrial septum intact without defect; normal RA; trace MR,TR, & PI; mild AV calcification and senile degeneration w/ mild stenosis, AVA 1.7cm^2;;    TEE WITHOUT CARDIOVERSION N/A 05/30/2018   Procedure: TRANSESOPHAGEAL ECHOCARDIOGRAM (TEE);  Surgeon: [name], MD;  Location: [facility];  Service: Open Heart Surgery;  Laterality: N/A;   TOTAL HIP ARTHROPLASTY Right 10/23/2017   Procedure: TOTAL HIP ARTHROPLASTY ANTERIOR APPROACH;  Surgeon: Frederik Pear, MD;  Location: [facility];  Service: Orthopedics;  Laterality: Right;   TRANSTHORACIC ECHOCARDIOGRAM  01-11-2017   dr [name]  (per echo note, no significant change in seveity of AS, no other diagnostic change)   moderate concentric LVH, ef [id number], grade 1 diastolic dysfunction/  moderate LAE/  mild , grade 1 AR w/ moderate AV calcification,  mild to moderate restricted AV leaflets w/ moderate AS, AVA 1.16cm^2, peak grandient 38mmHg, mean grandient 70mmHg/  trace MR, mild calcification MV annulus , mild MV leaflet calcification, mild MVS, peak grandient 4.63mmHg, mean grandient 2.63mmHg/ trace TR       Family History  Problem Relation Age of Onset   Stroke Mother    Hypertension Mother    Heart attack Father    Heart murmur Father    Hypertension Sister    Colon cancer Neg Hx    Stomach cancer Neg Hx    Rectal cancer Neg Hx    Pancreatic cancer Neg Hx     Social History   Tobacco Use   Smoking status: Former Smoker    Years: 1.00    Types: Cigarettes    Quit date: 02/04/1967  Years since quitting: 52.8   Smokeless tobacco: Never Used   Tobacco comment: smoked 1 q2-3 days  Substance Use Topics   Alcohol use: Yes    Comment: occasional beer   Drug use: No    Home Medications Prior to Admission medications   Medication Sig Start Date End Date Taking? Authorizing Provider  amiodarone (PACERONE) 200 MG tablet TAKE 1 TABLET BY MOUTH  DAILY Patient taking differently: Take 200 mg by mouth daily.  06/21/19   Adrian Prows, MD  atorvastatin (LIPITOR) 80 MG tablet TAKE 1 TABLET BY MOUTH  DAILY 10/04/19   Adrian Prows, MD  Bromfenac Sodium (PROLENSA) 0.07 % SOLN Place 1 drop into the right eye 4 (four) times daily. 11/29/19   [name], MD  colesevelam [facility]) 625 MG tablet Take 625 mg by mouth 3 (three) times daily.     [provider]  donepezil (ARICEPT) 10 MG tablet Take 10 mg by mouth every morning.  03/19/12   [provider]  dorzolamide-timolol (COSOPT) 22.3-6.8 MG/ML ophthalmic solution Place 1 drop into the right eye 2 (two) times daily. 06/21/19   [name], MD  esomeprazole (NEXIUM) 40 MG capsule Take 40 mg by mouth daily.  06/18/19   [provider]  ferrous sulfate 325 (65 FE) MG tablet Take 325 mg by mouth 3 (three) times daily with meals.    [provider]   gabapentin (NEURONTIN) 300 MG capsule Take 300 mg by mouth 2 (two) times daily.  06/14/19   [provider]  insulin glargine (LANTUS SOLOSTAR) 100 UNIT/ML injection Inject 20-40 Units into the skin at bedtime as needed (BGL below 140-150 take 20 units, if 151 or higher take 40 units).     [provider]  lidocaine (LIDODERM) 5 % Place 1 patch onto the skin daily. Remove & Discard patch within 12 hours or as directed by MD 12/08/19   Laraina Sulton A, PA-C  memantine (NAMENDA) 5 MG tablet Take 5 mg by mouth 2 (two) times daily.     [provider]  metoprolol tartrate (LOPRESSOR) 25 MG tablet Take 1 tablet (25 mg total) by mouth daily. 10/04/19   Adrian Prows, MD  mirabegron ER (MYRBETRIQ) 50 MG TB24 tablet Take 50 mg by mouth at bedtime.     [provider]  omega-3 acid ethyl esters (LOVAZA) 1 G capsule Take 2 g by mouth daily.     [provider]  prednisoLONE acetate (PRED FORTE) 1 % ophthalmic suspension Place 1 drop into the right eye every 2 (two) hours while awake. 09/20/19   [name], MD  rOPINIRole (REQUIP) 2 MG tablet Take 2 mg by mouth 3 (three) times daily as needed (RLS).     [provider]  spironolactone (ALDACTONE) 25 MG tablet Take 0.5 tablets (12.5 mg total) by mouth daily. 10/04/19   Adrian Prows, MD  tamsulosin (FLOMAX) 0.4 MG CAPS capsule Take 0.4 mg by mouth daily after supper.     [provider]  torsemide (DEMADEX) 20 MG tablet Take 1 tablet (20 mg total) by mouth 2 (two) times daily as needed. 10/29/19 11/28/19  Patwardhan, [name], MD  valsartan-hydrochlorothiazide (DIOVAN-HCT) 80-12.5 MG tablet TAKE 1 TABLET BY MOUTH IN  THE MORNING Patient taking differently: Take 1 tablet by mouth every morning.  06/21/19   Adrian Prows, MD  vitamin B-12 (CYANOCOBALAMIN) 1000 MCG tablet Take 1,000 mcg by mouth 2 (two) times daily.    [provider]  Alveda Reasons 15  MG TABS tablet TAKE 1 TABLET BY MOUTH  EVERY EVENING AFTER  DINNER Patient taking differently: Take 15 mg by mouth daily with supper.  06/21/19   Adrian Prows, MD    Allergies    Patient has no known allergies.  Review of Systems   Review of Systems  HENT: Negative.   Respiratory: Negative.   Cardiovascular: Negative.   Gastrointestinal: Negative.   Genitourinary: Negative.   Musculoskeletal: Negative.        Right shoulder pain  Skin: Positive for wound.  Neurological: Negative.   All other systems reviewed and are negative.   Physical Exam Updated Vital Signs BP (!) 121/56 (BP Location: Left Arm)    Pulse (!) 54    Temp 99.9 F (37.7 C) (Oral)    Resp 16    Ht 5\' 9"  (1.753 m)    Wt 99 kg    SpO2 98%    BMI 32.23 kg/m   Physical Exam Vitals and nursing note reviewed.  Constitutional:      General: He is not in acute distress.    Appearance: He is well-developed. He is ill-appearing (Chronically ill appearing). He is not toxic-appearing or diaphoretic.  HENT:     Head: Normocephalic. Abrasion present. No raccoon eyes, Battle's sign, contusion, right periorbital erythema, left periorbital erythema or laceration.     Jaw: There is normal jaw occlusion.      Comments: Hematoma to left forehead with some mild ecchymosis to supraorbital left upper eyebrow. No raccoon eyes, battle sign. No lacerations.    Right Ear: No hemotympanum.     Left Ear: No hemotympanum.     Nose: Nose normal.     Right Sinus: No maxillary sinus tenderness or frontal sinus tenderness.     Left Sinus: No maxillary sinus tenderness or frontal sinus tenderness.     Comments: No septal deviation. No septal hematoma    Mouth/Throat:     Lips: Pink.     Mouth: Mucous membranes are moist.     Dentition: Has dentures.     Pharynx: Oropharynx is clear. Uvula midline.     Comments: Dentition intact. Tongue midline. No evidence of intraoral trauma Eyes:     General: Lids are normal.     Extraocular Movements: Extraocular movements intact.     Pupils: Pupils are  equal, round, and reactive to light.     Comments: EOMs intact. 2 mm pupil left, 3 mm pupil right.  Neck:     Trachea: Trachea and phonation normal.      Comments: Mild tenderness to right trapezius, full range of motion cervical spine. Cardiovascular:     Rate and Rhythm: Normal rate and regular rhythm.     Pulses: Normal pulses.          Dorsalis pedis pulses are 2+ on the right side and 2+ on the left side.       Posterior tibial pulses are 2+ on the right side and 2+ on the left side.  Pulmonary:     Effort: Pulmonary effort is normal. No respiratory distress.     Breath sounds: Normal breath sounds and air entry.     Comments: Speaks in full sentences without difficulty. Chest:     Comments: No crepitus, step-offs. No flail chest, equal rise and fall of chest. Old midline CABG scar without any overlying skin changes to indicate infectious process Abdominal:     General: Bowel sounds are normal. There is no distension.  Palpations: Abdomen is soft.     Tenderness: There is no abdominal tenderness. There is no right CVA tenderness, left CVA tenderness, guarding or rebound.     Hernia: No hernia is present.     Comments: Soft, nontender without rebound or guarding  Musculoskeletal:     Right shoulder: Tenderness present. Decreased range of motion.     Right upper arm: Normal.     Left upper arm: Normal.     Right elbow: Normal.     Left elbow: Normal.     Right forearm: Normal.     Left forearm: Normal.     Right wrist: Normal.     Left wrist: Normal.       Arms:     Cervical back: Full passive range of motion without pain, normal range of motion and neck supple.     Right hip: Normal.     Left hip: Normal.     Right upper leg: Normal.     Left upper leg: Normal.     Right knee: Normal.     Left knee: Normal.     Right lower leg: Normal.     Left lower leg: Normal.     Right ankle: Normal.     Left ankle: Normal.     Comments: Pelvis stable, nontender palpation. No  midline spinal tenderness or step-offs. Tenderness to right anterior and lateral shoulder. Decreased range of motion secondary to pain. Able to flex and extend at bilateral elbows. No bony tenderness to humerus, forearm or hands. No shortening or rotation of legs. Able to flex and extend at bilateral knees.  Lymphadenopathy:     Cervical: No cervical adenopathy.  Skin:    General: Skin is warm and dry.     Capillary Refill: Capillary refill takes less than 2 seconds.     Comments: 1 cm skin tear to left midshaft forearm. No active bleeding or drainage. No surrounding edema, erythema or warmth  Neurological:     General: No focal deficit present.     Mental Status: He is alert. Mental status is at baseline.     Cranial Nerves: Cranial nerves are intact.     Sensory: Sensation is intact.     Motor: Motor function is intact.     Coordination: Coordination is intact.     Gait: Gait is intact.     Deep Tendon Reflexes: Reflexes are normal and symmetric.     Comments: Mental Status:  Alert, oriented, thought content appropriate. Speech fluent without evidence of aphasia. Able to follow 2 step commands without difficulty.  Cranial Nerves:  II:  Peripheral visual fields grossly normal,Pupils slightly asymmetrical, hx of multiple eye surgeries, round, reactive to light III,IV, VI: ptosis not present, extra-ocular motions intact bilaterally  V,VII: smile symmetric, facial light touch sensation equal VIII: hearing grossly normal bilaterally  IX,X: midline uvula rise  XI: bilateral shoulder shrug equal and strong XII: midline tongue extension  Motor:  5/5 in upper and lower extremities bilaterally including strong and equal grip strength and dorsiflexion/plantar flexion Sensory: Pinprick and light touch normal in all extremities.  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 Transient confusion and slurred speech. EXAM: CT HEAD WITHOUT CONTRAST TECHNIQUE: Contiguous axial images were obtained from the base of the skull through the vertex without intravenous contrast. COMPARISON:  03/08/2018 FINDINGS: Brain: Stable atrophy mild chronic ischemic microvascular white matter disease. Otherwise, the brainstem, cerebellum, cerebral peduncles, thalamus, basal ganglia, basilar cisterns, and ventricular system appear within normal limits. No intracranial hemorrhage, mass lesion, or acute CVA. Vascular: There is atherosclerotic calcification of the cavernous carotid arteries bilaterally. Skull: Unremarkable Sinuses/Orbits: Chronic bilateral mastoid effusions. Prior maxillary antrostomies and ethmoidectomies. Chronic appearing right nasal bone deformity Other: No supplemental non-categorized findings. IMPRESSION: 1. No acute intracranial findings. 2. Stable atrophy and mild chronic ischemic microvascular white matter disease. 3. Chronic bilateral mastoid effusions. Electronically Signed   By: Van Clines M.D.   On: 12/08/2019 15:07    Procedures Procedures  (including critical care time)  Medications Ordered in ED Medications - No data to display  ED Course  I have reviewed the triage vital signs and the nursing notes.  Pertinent labs & imaging results that were available during my care of the patient were reviewed by me and considered in my medical decision making (see chart for details).  79 year old male presents for evaluation after mechanical fall which occurred 2 days ago. He is afebrile, nonseptic appearing. Patient with hematoma to left forehead. Some mild supraorbital ecchymosis. EOMs intact. He does have asymmetrical pupils however I reviewed patient's prior ophthalmology note and his eyes appear to be at baseline compared to this note. No facial droop. Cranial nerves II 12 grossly intact. Nonfocal neuro exam without deficits. Does have some right shoulder pain decreased range of motion. Does admit to falling on this on Friday. No paresthesias. Unable to perform Hawkins, empty can due to pain. Pelvis stable, nontender palpation. No shortening or rotation of legs. Ambulatory with out difficulty. No midline spinal tenderness. Plan on imaging reassess. Tetanus is out of date. Will update and have nursing clean skin tear. Wife did apparently say on triage note the patient had intermittent episode of confusion earlier this morning. Patient's wife is adamant that he has had his baseline mentation has not had any episodes of AMS.  Imaging personally viewed interpreted EKG does have some possible flutter versus A. fib however is rate controlled. Chronically on Eliquis. No RVR. CT head without any acute changes. CT cervical spine without acute traumatic findings DG shoulder without acute traumatic findings  Patient reassessed. Denies any complaints. Concern for rotatory cuff injury given pain with movement. No radicular pain or pain to midshaft humerus or over clavicle, scapula. Will dc home with splint and have close follow up with Ortho.  Again  asked patient's wife note stating confusion and she again denies any confusion.  States he is at his baseline mentation.  He continues have a nonfocal neuro exam without deficits.  Ambulatory at his baseline.  Will DC home with sling for arm pain. No CP,  SOB to suggest atypical ACS, PE, Dissection.  The patient has been appropriately medically screened and/or stabilized in the ED. I have low suspicion for any other emergent medical condition which would require further screening, evaluation or treatment in the ED or require inpatient management.  Patient is hemodynamically stable and in no acute distress.  Patient able to ambulate in department prior to ED.  Evaluation does not show acute pathology that would require ongoing or additional emergent interventions while in the emergency department or further inpatient treatment.  I have discussed the diagnosis with the patient and answered all questions.  Pain is been managed while in the emergency department and patient has no further complaints prior to discharge.  Patient is comfortable with plan discussed in room and is stable for discharge at this time.  I have discussed strict return precautions for returning to the emergency department.  Patient was encouraged to follow-up with PCP/specialist refer to at discharge.    MDM Rules/Calculators/A&P                       Final Clinical Impression(s) / ED Diagnoses Final diagnoses:  Fall, initial encounter  Abrasion  Skin tear of left upper arm without complication, initial encounter  Acute pain of right shoulder    Rx / DC Orders ED Discharge Orders         Ordered    lidocaine (LIDODERM) 5 %  Every 24 hours     12/08/19 1706           Vergie Zahm A, PA-C 12/08/19 1711    Charlesetta Shanks, MD 12/08/19 2208

## 2019-12-08 NOTE — Discharge Instructions (Signed)
Follow-up with orthopedics for shoulder pain  Make sure to follow-up with primary care doctor to let them know you are seen here in the emergency department  Return for any new worsening symptoms

## 2019-12-08 NOTE — ED Triage Notes (Signed)
Pt had a fall on Friday in the yard due to tripping over a hose. Today pt c/o bruising around L eye. Skin tear L forearm. This AM pt's wife noted that pt had some transient confusion and slurring of speech. Pt is on Xerelto. Pt A/Ox4 during triage. Noted pt's pupils unequal. EDP notified at triage.    Pt to CT with RN at this time.

## 2019-12-09 ENCOUNTER — Telehealth (INDEPENDENT_AMBULATORY_CARE_PROVIDER_SITE_OTHER): Payer: Self-pay

## 2019-12-27 ENCOUNTER — Ambulatory Visit: Payer: Medicare Other | Admitting: Oncology

## 2019-12-27 ENCOUNTER — Other Ambulatory Visit: Payer: Medicare Other

## 2020-01-01 ENCOUNTER — Inpatient Hospital Stay: Payer: Medicare Other | Attending: Oncology

## 2020-01-01 ENCOUNTER — Inpatient Hospital Stay (HOSPITAL_BASED_OUTPATIENT_CLINIC_OR_DEPARTMENT_OTHER): Payer: Medicare Other | Admitting: Oncology

## 2020-01-01 ENCOUNTER — Other Ambulatory Visit: Payer: Self-pay

## 2020-01-01 VITALS — BP 135/61 | HR 53 | Temp 97.6°F | Resp 17 | Ht 69.0 in | Wt 218.1 lb

## 2020-01-01 DIAGNOSIS — D649 Anemia, unspecified: Secondary | ICD-10-CM | POA: Diagnosis not present

## 2020-01-01 DIAGNOSIS — Z7901 Long term (current) use of anticoagulants: Secondary | ICD-10-CM | POA: Insufficient documentation

## 2020-01-01 DIAGNOSIS — D631 Anemia in chronic kidney disease: Secondary | ICD-10-CM | POA: Diagnosis present

## 2020-01-01 DIAGNOSIS — C61 Malignant neoplasm of prostate: Secondary | ICD-10-CM

## 2020-01-01 DIAGNOSIS — I4891 Unspecified atrial fibrillation: Secondary | ICD-10-CM | POA: Diagnosis not present

## 2020-01-01 DIAGNOSIS — Z8546 Personal history of malignant neoplasm of prostate: Secondary | ICD-10-CM | POA: Insufficient documentation

## 2020-01-01 DIAGNOSIS — Z79899 Other long term (current) drug therapy: Secondary | ICD-10-CM | POA: Insufficient documentation

## 2020-01-01 DIAGNOSIS — D509 Iron deficiency anemia, unspecified: Secondary | ICD-10-CM | POA: Insufficient documentation

## 2020-01-01 DIAGNOSIS — N189 Chronic kidney disease, unspecified: Secondary | ICD-10-CM | POA: Diagnosis present

## 2020-01-01 LAB — CBC WITH DIFFERENTIAL (CANCER CENTER ONLY)
Abs Immature Granulocytes: 0.04 10*3/uL (ref 0.00–0.07)
Basophils Absolute: 0.1 10*3/uL (ref 0.0–0.1)
Basophils Relative: 1 %
Eosinophils Absolute: 0.4 10*3/uL (ref 0.0–0.5)
Eosinophils Relative: 6 %
HCT: 31.4 % — ABNORMAL LOW (ref 39.0–52.0)
Hemoglobin: 10.2 g/dL — ABNORMAL LOW (ref 13.0–17.0)
Immature Granulocytes: 1 %
Lymphocytes Relative: 14 %
Lymphs Abs: 0.9 10*3/uL (ref 0.7–4.0)
MCH: 29.8 pg (ref 26.0–34.0)
MCHC: 32.5 g/dL (ref 30.0–36.0)
MCV: 91.8 fL (ref 80.0–100.0)
Monocytes Absolute: 0.6 10*3/uL (ref 0.1–1.0)
Monocytes Relative: 10 %
Neutro Abs: 4.3 10*3/uL (ref 1.7–7.7)
Neutrophils Relative %: 68 %
Platelet Count: 176 10*3/uL (ref 150–400)
RBC: 3.42 MIL/uL — ABNORMAL LOW (ref 4.22–5.81)
RDW: 14 % (ref 11.5–15.5)
WBC Count: 6.3 10*3/uL (ref 4.0–10.5)
nRBC: 0 % (ref 0.0–0.2)

## 2020-01-01 MED ORDER — FERROUS SULFATE 325 (65 FE) MG PO TABS: 325.0000 mg | ORAL_TABLET | Freq: Every day | ORAL | 3 refills | Status: DC

## 2020-01-01 NOTE — Progress Notes (Signed)
[facility] Note  01/02/2020     CHIEF COMPLAINT Patient presents for Retina Follow Up   HISTORY OF PRESENT ILLNESS: Cody Phelps is a 79 y.o. male who presents to the clinic today for:   HPI    Retina Follow Up    Patient presents with  Other.  In right eye.  I, the attending physician,  performed the HPI with the patient and updated documentation appropriately.          Comments    5 week follow up Dislocated IOL, PPV, Sutured IOL 09/12/2019. Pt is doing well.  Vision did improve after sx but not as much as he was hoping for.  Denies any new problems since last visit.  Using 3 different drops.  His wife puts them in, unsure name or how she is putting them in for him.       Last edited by [name], MD on 01/02/2020 10:08 AM. (History)    pt states his are "not really matched up good", but he is not having any eye pain, pts wife states he fell and hit his head and left eye since his last visit here, she states his pupil was much smaller than the other one, he was seen at [facility] ED, but was not dx with a concussion, he is in physical therapy right now bc he could not move his right arm after the fall  Referring physician: [name], MD [place],  [place] [address]  HISTORICAL INFORMATION:   Selected notes from the [facility] Referred by Dr. [name] for concern of RD OD   CURRENT MEDICATIONS: Current Outpatient Medications (Ophthalmic Drugs)  Medication Sig  . Bromfenac Sodium (PROLENSA) 0.07 % SOLN Place 1 drop into the right eye 4 (four) times daily.  . dorzolamide-timolol (COSOPT) 22.3-6.8 MG/ML ophthalmic solution Place 1 drop into the right eye 2 (two) times daily.  . prednisoLONE acetate (PRED FORTE) 1 % ophthalmic suspension Place 1 drop into the right eye every 2 (two) hours while awake.   No current facility-administered medications for this visit. (Ophthalmic Drugs)   Current Outpatient  Medications (Other)  Medication Sig  . amiodarone (PACERONE) 200 MG tablet TAKE 1 TABLET BY MOUTH  DAILY (Patient taking differently: Take 200 mg by mouth daily. )  . atorvastatin (LIPITOR) 80 MG tablet TAKE 1 TABLET BY MOUTH  DAILY  . colesevelam (WELCHOL) 625 MG tablet Take 625 mg by mouth 3 (three) times daily.   Marland Kitchen donepezil (ARICEPT) 10 MG tablet Take 10 mg by mouth every morning.   Marland Kitchen esomeprazole (NEXIUM) 40 MG capsule Take 40 mg by mouth daily.   . ferrous sulfate 325 (65 FE) MG tablet Take 1 tablet (325 mg total) by mouth daily with breakfast.  . gabapentin (NEURONTIN) 300 MG capsule Take 300 mg by mouth 2 (two) times daily.   . insulin glargine (LANTUS SOLOSTAR) 100 UNIT/ML injection Inject 20-40 Units into the skin at bedtime as needed (BGL below 140-150 take 20 units, if 151 or higher take 40 units).   . lidocaine (LIDODERM) 5 % Place 1 patch onto the skin daily. Remove & Discard patch within 12 hours or as directed by MD  . memantine (NAMENDA) 5 MG tablet Take 5 mg by mouth 2 (two) times daily.   . metoprolol tartrate (LOPRESSOR) 25 MG tablet Take 1 tablet (25 mg total) by mouth daily.  . mirabegron ER (MYRBETRIQ) 50 MG TB24  tablet Take 50 mg by mouth at bedtime.   Marland Kitchen omega-3 acid ethyl esters (LOVAZA) 1 G capsule Take 2 g by mouth daily.   Marland Kitchen rOPINIRole (REQUIP) 2 MG tablet Take 2 mg by mouth 3 (three) times daily as needed (RLS).   Marland Kitchen spironolactone (ALDACTONE) 25 MG tablet Take 0.5 tablets (12.5 mg total) by mouth daily.  . tamsulosin (FLOMAX) 0.4 MG CAPS capsule Take 0.4 mg by mouth daily after supper.   . torsemide (DEMADEX) 20 MG tablet Take 1 tablet (20 mg total) by mouth 2 (two) times daily as needed.  . valsartan-hydrochlorothiazide (DIOVAN-HCT) 80-12.5 MG tablet TAKE 1 TABLET BY MOUTH IN  THE MORNING (Patient taking differently: Take 1 tablet by mouth every morning. )  . vitamin B-12 (CYANOCOBALAMIN) 1000 MCG tablet Take 1,000 mcg by mouth 2 (two) times daily.  Alveda Reasons 15 MG  TABS tablet TAKE 1 TABLET BY MOUTH  EVERY EVENING AFTER DINNER (Patient taking differently: Take 15 mg by mouth daily with supper. )   No current facility-administered medications for this visit. (Other)      REVIEW OF SYSTEMS: ROS    Positive for: Musculoskeletal, Endocrine, Cardiovascular, Eyes, Respiratory   Negative for: Constitutional, Gastrointestinal, Neurological, Skin, Genitourinary, HENT, Psychiatric, Allergic/Imm, Heme/Lymph   Last edited by Leonie Douglas, COA on 01/02/2020  9:40 AM. (History)       ALLERGIES No Known Allergies  PAST MEDICAL HISTORY Past Medical History:  Diagnosis Date  . Acute anterior wall MI ([place] [place]) 03/01/2018  . Acute combined systolic and diastolic heart failure ([place]) 03/15/2018  . Bronchitis, acute 05/04/2012  . Burn   . Carotid stenosis, left followed by dr [name], cardiologist--- bilateral bruit per note   per dr [name] note 22/2979  LICA 89-21% stenosis per duplex 05-14-2014  . Chronic diastolic CHF (congestive heart failure) ([place]) 09/21/2018  . Dementia ([place])   . Diverticulosis   . DM (diabetes mellitus) type 2, uncontrolled, with ketoacidosis ([place]) 11/25/2013  . ED (erectile dysfunction)   . First degree heart block   . GERD (gastroesophageal reflux disease)   . Heart murmur   . History of blood transfusion    with open heart surgery  . Hypertension    cardiologsit-  dr [name]  . Hypertensive retinopathy    OU  . Iron deficiency anemia   . Mixed hyperlipidemia   . Morbid obesity ([place])   . Myocardial infarction ([place])    02/28/18  . OA (osteoarthritis)    right hip  . OSA on CPAP    followed by dr dohmeier, uses CPAP  . PAF (paroxysmal atrial fibrillation) ([name])    a. on Eliquis  . Prostate cancer ([place])    dx 03-17-2017 (bx)  Stage T2a, Gleason 4+4, PSA  4.43, vol 32.32--  s/p radiatctive prostate seed implants 07-10-2017 then IMRT and ADT  . Retinal detachment    Rheg. RD OS  . RLS (restless legs syndrome)   . S/P aortic valve  replacement with bioprosthetic valve 05/30/2018   23 mm Edwards Inspiris Resilia stented bovine pericardial tissue valve  . 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 S/P Maze operation for atrial fibrillation 05/30/2018   Complete bilateral atrial lesion set using bipolar radiofrequency and cryothermy ablation with clipping of LA appendage  . Scoliosis   . Severe aortic stenosis   . Trigger finger   . Type 2 diabetes mellitus ([place])   . Wears partial dentures    upper and  lower   Past Surgical History:  Procedure Laterality Date  . AORTIC VALVE REPLACEMENT N/A 05/30/2018   Procedure: AORTIC VALVE REPLACEMENT (AVR) using Inspiris Valve, Size 23;  Surgeon: [name], MD;  Location: [facility];  Service: Open Heart Surgery;  Laterality: N/A;  . APPENDECTOMY  1988  . BILATERAL TOTAL ETHMOIDECTOMY AND SPHENOIDECTOMY  01-14-2009    dr bates   [facility]  . CARDIOVERSION N/A 08/10/2018   Procedure: CARDIOVERSION;  Surgeon: Adrian Prows, MD;  Location: [facility];  Service: Cardiovascular;  Laterality: N/A;  . CARPAL TUNNEL RELEASE Bilateral 2013  . CATARACT EXTRACTION Bilateral    Dr. [name]  . CATARACT EXTRACTION W/ INTRAOCULAR LENS  IMPLANT, BILATERAL  date?  . CORONARY ARTERY BYPASS GRAFT N/A 05/30/2018   Procedure: CORONARY ARTERY BYPASS GRAFTING (CABG) x one using left internal mammary artery;  Surgeon: [name], MD;  Location: [facility];  Service: Open Heart Surgery;  Laterality: N/A;  . CORONARY BALLOON ANGIOPLASTY N/A 03/01/2018   Procedure: CORONARY BALLOON ANGIOPLASTY;  Surgeon: Adrian Prows, MD;  Location: [facility];  Service: Cardiovascular;  Laterality: N/A;  . CORONARY/GRAFT ACUTE MI REVASCULARIZATION N/A 03/01/2018   Procedure: Coronary/Graft Acute MI Revascularization;  Surgeon: Adrian Prows, MD;  Location: [facility];  Service: Cardiovascular;  Laterality: N/A;  . CYSTOSCOPY  07/19/2017   Procedure: CYSTOSCOPY;  Surgeon: Nickie Retort, MD;  Location:  [facility];  Service: Urology;;  No seeds found in [place]  . EYE SURGERY Bilateral    Cat Sx OU.  Dislocated IOL sx OD.  Rheg. RD repair OS.  Marica Otter EXCHANGE Right 06/13/2019   Procedure: GAS/FLUID EXCHANGE;  Surgeon: [name], MD;  Location: [facility];  Service: Ophthalmology;  Laterality: Right;  . LAMINECTOMY WITH POSTERIOR LATERAL ARTHRODESIS LEVEL 2 N/A 02/12/2014   Procedure: LUMBAR TWO-THREE,LUIMBAR THREE-FOUR LAMINECTOMY/FORAMINOTOMY;POSSIBLE POSTEROLATERAL ARTHRODESIS WITH AUTOGRAFT;  Surgeon: [name], MD;  Location: [facility];  Service: Neurosurgery;  Laterality: N/A;  . LEFT HEART CATH AND CORONARY ANGIOGRAPHY N/A 03/01/2018   Procedure: LEFT HEART CATH AND CORONARY ANGIOGRAPHY;  Surgeon: Adrian Prows, MD;  Location: [facility];  Service: Cardiovascular;  Laterality: N/A;  . MAZE N/A 05/30/2018   Procedure: MAZE;  Surgeon: [name], MD;  Location: [facility];  Service: Open Heart Surgery;  Laterality: N/A;  . ORIF RIGHT ANKLE FX'S  05/05/2001   retained hardware  . PARS PLANA VITRECTOMY Right 06/13/2019   Procedure: PARS PLANA VITRECTOMY WITH 25 GAUGE WITH LASER AND GAS;  Surgeon: [name], MD;  Location: [facility];  Service: Ophthalmology;  Laterality: Right;  . PARS PLANA VITRECTOMY Right 09/12/2019   Procedure: [place] Vitrectomy With 25 Gauge;  Surgeon: [name], MD;  Location: [facility];  Service: Ophthalmology;  Laterality: Right;  . [place] INJECTION Right 06/13/2019   Procedure: PERFLUORONE INJECTION;  Surgeon: [name], MD;  Location: [facility];  Service: Ophthalmology;  Laterality: Right;  . PHOTOCOAGULATION WITH LASER Right 06/13/2019   Procedure: PHOTOCOAGULATION WITH LASER;  Surgeon: [name], MD;  Location: [facility];  Service: Ophthalmology;  Laterality: Right;  . PLACEMENT AND SUTURE OF SECONDARY INTRAOCULAR LENS Right 09/12/2019   Procedure: PLACEMENT AND SUTURE OF SECONDARY INTRAOCULAR LENS;  Surgeon: [name],  MD;  Location: [facility];  Service: Ophthalmology;  Laterality: Right;  . PROSTATE BIOPSY  03-17-2017   dr Pilar Jarvis office  . RADIOACTIVE SEED IMPLANT N/A 07/19/2017   Procedure: RADIOACTIVE SEED IMPLANT/BRACHYTHERAPY IMPLANT;  Surgeon: Nickie Retort, MD;  Location[facility];  Service: Urology;  Laterality: N/A;  69 seeds implanted  . REMOVAL RETAINED LENS Right 09/12/2019   Procedure: REMOVAL RETAINED LENS;  Surgeon: [name], MD;  Location: [facility];  Service: Ophthalmology;  Laterality: Right;  . RETINAL DETACHMENT SURGERY Right 06/13/2019   Dr. Coralyn Pear  . SKIN GRAFT     face  . SPACE OAR INSTILLATION N/A 07/19/2017   Procedure: SPACE OAR INSTILLATION;  Surgeon: Nickie Retort, MD;  Location: [facility];  Service: Urology;  Laterality: N/A;  . TEE WITHOUT CARDIOVERSION  03/20/2012   Procedure: TRANSESOPHAGEAL ECHOCARDIOGRAM (TEE);  Surgeon: Laverda Page, MD;  Location: [facility];  Service: Cardiovascular;  Laterality: N/A;  normal LV; normal EF; normal RV; normal LA w/ left atrial appendage very small, normal function, interatrial septum intact without defect; normal RA; trace MR,TR, & PI; mild AV calcification and senile degeneration w/ mild stenosis, AVA 1.7cm^2;;   . TEE WITHOUT CARDIOVERSION N/A 05/30/2018   Procedure: TRANSESOPHAGEAL ECHOCARDIOGRAM (TEE);  Surgeon: [name], MD;  Location: [facility];  Service: Open Heart Surgery;  Laterality: N/A;  . TOTAL HIP ARTHROPLASTY Right 10/23/2017   Procedure: TOTAL HIP ARTHROPLASTY ANTERIOR APPROACH;  Surgeon: Frederik Pear, MD;  Location: [facility];  Service: Orthopedics;  Laterality: Right;  . TRANSTHORACIC ECHOCARDIOGRAM  01-11-2017   dr [name]  (per echo note, no significant change in seveity of AS, no other diagnostic change)   moderate concentric LVH, ef 37%, grade 1 diastolic dysfunction/  moderate LAE/  mild , grade 1 AR w/ moderate AV calcification, mild to moderate restricted AV leaflets w/  moderate AS, AVA 1.16cm^2, peak grandient 34mHg, mean grandient 347mg/  trace MR, mild calcification MV annulus , mild MV leaflet calcification, mild MVS, peak grandient 4.30m17m, mean grandient 2.7mm8m trace TR    FAMILY HISTORY Family History  Problem Relation Age of Onset  . Stroke Mother   . Hypertension Mother   . Heart attack Father   . Heart murmur Father   . Hypertension Sister   . Colon cancer Neg Hx   . Stomach cancer Neg Hx   . Rectal cancer Neg Hx   . Pancreatic cancer Neg Hx     SOCIAL HISTORY Social History   Tobacco Use  . Smoking status: Former Smoker    Years: 1.00    Types: Cigarettes    Quit date: 02/04/1967    Years since quitting: 52.9  . Smokeless tobacco: Never Used  . Tobacco comment: smoked 1 q2-3 days  Substance Use Topics  . Alcohol use: Yes    Comment: occasional beer  . Drug use: No         OPHTHALMIC EXAM:  Base Eye Exam    Visual Acuity (Snellen - Linear)      Right Left   Dist [place] 20/60 +2 20/20 -2   Dist ph [place] 20/40 +1        Tonometry (Tonopen, 9:54 AM)      Right Left   Pressure 15 17       Pupils      Dark Light Shape React APD   Right 4 3 Round Minimal None   Left 2 1 Round Brisk None       Visual Fields (Counting fingers)      Left Right    Full Full       Extraocular Movement      Right Left  Full Full       Neuro/Psych    Oriented x3: Yes   Mood/Affect: Normal       Dilation    Both eyes: 1.0% Mydriacyl, 2.5% Phenylephrine @ 9:54 AM        Slit Lamp and Fundus Exam    External Exam      Right Left   External Periorbital edema        Slit Lamp Exam      Right Left   Lids/Lashes mild Meibomian gland dysfunction, mild Telangiectasia Dermatochalasis - upper lid, Meibomian gland dysfunction, chalazion upper lid   Conjunctiva/Sclera White and quiet Temporal Pinguecula   Cornea well healed superior corneal wound; 1+Punctate epithelial erosions, Arcus 1+ Punctate epithelial erosions, +verticellata,  trace endo pigment, Debris in tear film   Anterior Chamber Deep, clear Deep and quiet   Iris Round and poorly dilated to 81m, scattered Transillumination defects 360 Round and dilated, scattered, 360 Transillumination defects   Lens Sutured Akreos IOL well centered  3 piece Posterior chamber intraocular in good position, trace Posterior capsular opacification   Vitreous post vitrectomy, trace pigment Vitreous syneresis, Posterior vitreous detachment       Fundus Exam      Right Left   Disc 1+Pallor, Sharp rim Mild Pallor, Sharp rim, temporal PPP, mild tilt   C/D Ratio 0.3 0.4   Macula Blunted foveal reflex, mild RPE mottling and clumping, mild central cystic changes -- slightly improved Flat, Blunted foveal reflex, Retinal pigment epithelial mottling and clumping, No heme or edema   Vessels Vascular attenuation, mild Tortuousity Vascular attenuation, Tortuous   Periphery attached, good 360 laser in place; ORIGINALLY: bullous superior retinal detachment from 1000-0130, another more shallow detachment lobe from 130-400, lattice and micro tears ST quadrant; Old retinal tear at 0900 with surrounding laser, Attached, peripheral laser scars at 1030         Refraction    Manifest Refraction      Sphere Cylinder Axis Dist VA   Right -1.25 +2.00 050 20/30+2   Left           Manifest Refraction #2 (Auto)      Sphere Cylinder Axis Dist VA   Right -1.50 +2.50 033    Left -0.75 +1.00 169        Manifest Refraction Comments   R: 7.98 42.25 122 L 7.68 44.00 0          IMAGING AND PROCEDURES  Imaging and Procedures for _0 @  OCT, Retina - OU - Both Eyes       Right Eye Quality was good. Central Foveal Thickness: 279. Progression has improved. Findings include normal foveal contour, no SRF, intraretinal fluid (Interval improvement in IRF and foveal contour).   Left Eye Quality was good. Central Foveal Thickness: 264. Progression has been stable. Findings include normal foveal  contour, no IRF, no SRF.   Notes *Images captured and stored on drive  Diagnosis / Impression:  OD: Retina re-attached; interval improvement in cystic changes and foveal contour OS: NFP, no IRF/SRF   Clinical management:  See below  Abbreviations: NFP - Normal foveal profile. CME - cystoid macular edema. PED - pigment epithelial detachment. IRF - intraretinal fluid. SRF - subretinal fluid. EZ - ellipsoid zone. ERM - epiretinal membrane. ORA - outer retinal atrophy. ORT - outer retinal tubulation. SRHM - subretinal hyper-reflective material                 ASSESSMENT/PLAN:    ICD-10-CM  1. Dislocation of intraocular lens, sequela  T85.22XS   2. Aphakia of right eye  H27.01   3. Right retinal detachment  H33.21   4. 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 Retinal edema  H35.81 OCT, Retina - OU - Both Eyes  6. History of repair of retinal tear by laser photocoagulation  Z98.890   7. Diabetes mellitus type 2 without retinopathy ([place])  E11.9   8. Essential hypertension  I10   9. Hypertensive retinopathy of both eyes  H35.033   10. Pseudophakia of both eyes  Z96.1   11. Meibomian gland dysfunction (MGD) of both eyes  H02.883    H02.886   12. Chalazion left upper eyelid  H00.14    1,2. Dislocated IOL OD  - pt with history of partially dislocated 3-piece IOL -- spontaneously dislocated completely into vitreous cavity on 1.23.21  - s/p 25g PPV w/ IOL explantation and secondary sutured IOL OD -- Akreos AO60 lens, 17.5D -- 02.11.2021             - IOL in good position  - 2 superior nylon sutures removed at slit lamp 03.12.21 -- 1 nylon suture remains at 1200 -- removed at slit lamp, 04.30.21  - corneal edema resolved; no epi defect; mild PEE  - BCVA decrease to 20/40 from 20/30  - OCT shows interval improvement in IRF and foveal contour             - IOP okay at 15             - cont PF and Prolensa qid OD  - ATs QID OU              - f/u 4-6 weeks for POV,  possible injection (STK) for CME   3-5. Rhegmatogenous retinal detachment, right eye  - bullous, superior, mac off detachment, onset of foveal involvement 11.11.20 by history  - Bi-lobed superior detachment, superior lobe spanning 1000-0130, nasal lobe 0130-0400  - lattice degeneration with microtears noted at 1030  - s/p PPV/PFC/EL/FAX/14% C3F8 OD, 11.12.20             - intraop: HST at 2 oclock was found; also detachment had progressed to subtotal detachment spanning 9 oclock to 6 oclock (going in clockwise direction); also severe zonular insufficiency with IOL very mobile throughout case -- did not dislocate completely during the case or immediately post op  - doing well              - retina attached and in good position -- good laser in place  6. History of retinal defects s/p laser retinopexy OU with Dr. Zigmund Daniel in 2013  - OD laser at 0900  - OS laser at 1030  - stable  7. Diabetes mellitus, type 2 without retinopathy  - The incidence, risk factors for progression, natural history and treatment options for diabetic retinopathy  were discussed with patient.    - The need for close monitoring of blood glucose, blood pressure, and serum lipids, avoiding cigarette or any type of tobacco, and the need for long term follow up was also discussed with patient  8,9. Hypertensive retinopathy OU  - discussed importance of tight BP control  - monitor  10. Pseudophakia OU  - s/p CE/IOL OU (Dr. [name])  - 3 piece IOL OD was completely displaced into vitreous -- now with sutured Akreos IOL in good position -- see above  - OS 3-piece IOL in good position  -  monitor  11,12. MGD OU and Chalazion LUL - improved  - lid scrubs and warm compresses BID for 20 minutes at a time, prn   Ophthalmic Meds Ordered this visit:  No orders of the defined types were placed in this encounter.      Return for f/u 4-6 weeks, CME OD, DFE, OCT.  There are no Patient Instructions on file for this  visit.   Explained the diagnoses, plan, and follow up with the patient and they expressed understanding.  Patient expressed understanding of the importance of proper follow up care.  This document serves as a record of services personally performed by Gardiner Sleeper, MD, PhD. It was created on their behalf by Ernest Mallick, OA, an ophthalmic assistant. The creation of this record is the provider's dictation and/or activities during the visit.    Electronically signed by: Ernest Mallick, OA 06.02.2021 11:45 PM  Gardiner Sleeper, M.D., Ph.D. Diseases & Surgery of the Retina and Vitreous [facility]  I have reviewed the above documentation for accuracy and completeness, and I agree with the above. Gardiner Sleeper, M.D., Ph.D. 01/02/20 11:45 PM   Abbreviations: M myopia (nearsighted); A astigmatism; H hyperopia (farsighted); P presbyopia; Mrx spectacle prescription;  CTL contact lenses; OD right eye; OS left eye; OU both eyes  XT exotropia; ET esotropia; PEK punctate epithelial keratitis; PEE punctate epithelial erosions; DES dry eye syndrome; MGD meibomian gland dysfunction; ATs artificial tears; PFAT's preservative free artificial tears; [place] nuclear sclerotic cataract; PSC posterior subcapsular cataract; ERM epi-retinal membrane; PVD posterior vitreous detachment; RD retinal detachment; DM diabetes mellitus; DR diabetic retinopathy; NPDR non-proliferative diabetic retinopathy; PDR proliferative diabetic retinopathy; CSME clinically significant macular edema; DME diabetic macular edema; dbh dot blot hemorrhages; CWS cotton wool spot; POAG primary open angle glaucoma; C/D cup-to-disc ratio; HVF humphrey visual field; GVF goldmann visual field; OCT optical coherence tomography; IOP intraocular pressure; BRVO Branch retinal vein occlusion; CRVO central retinal vein occlusion; CRAO central retinal artery occlusion; BRAO branch retinal artery occlusion; RT retinal tear; SB scleral  buckle; PPV pars plana vitrectomy; VH Vitreous hemorrhage; PRP panretinal laser photocoagulation; IVK intravitreal kenalog; VMT vitreomacular traction; MH Macular hole;  NVD neovascularization of the disc; NVE neovascularization elsewhere; AREDS age related eye disease study; ARMD age related macular degeneration; POAG primary open angle glaucoma; EBMD epithelial/anterior basement membrane dystrophy; ACIOL anterior chamber intraocular lens; IOL intraocular lens; PCIOL posterior chamber intraocular lens; Phaco/IOL phacoemulsification with intraocular lens placement; [place] photorefractive keratectomy; LASIK laser assisted in situ keratomileusis; HTN hypertension; DM diabetes mellitus; COPD chronic obstructive pulmonary disease

## 2020-01-01 NOTE — Progress Notes (Signed)
Hematology and Oncology Follow Up Visit  Cody Phelps [id number] [birth year] 79 y.o. 01/01/2020 2:50 PM Cody Phelps, MDSouth, Cody Main, MD   Principle Diagnosis: 79 year old man with anemia related to iron deficiency as well as chronic disease diagnosed in 2019.    Secondary diagnosis: Prostate cancer diagnosed in 2018 with Gleason score of 4+4 = 8 and a PSA 4.4.  Prior Therapy:  Definitive radiation therapy utilizing IMRT between August 15, 2017 and September 18, 2017.  He also received brachytherapy boost upfront on January 4 of 2019.  He received 45 Gy in 25 fraction to supplement his post implant of 110 Gy.  He completed androgen deprivation therapy under the care of Dr. Junious Silk for close to 2 years.  He status post IV iron infusion in August 2019.  He received Feraheme in November 2020.  Current therapy: Active surveillance.  Interim History: [name] is here for a follow-up visit.  Since the last visit, he reports no major changes in his health.  He has been on oral iron supplements although has not been taking it as of late.  He does report some occasional hematochezia but no melena.  He denies any abdominal pain or discomfort.  He does report bleeding hemorrhoids occasionally.  He denies any chest pain or difficulty breathing.  He denies any palpitation.        Medications: Reviewed without changes. Current Outpatient Medications  Medication Sig Dispense Refill  . amiodarone (PACERONE) 200 MG tablet TAKE 1 TABLET BY MOUTH  DAILY (Patient taking differently: Take 200 mg by mouth daily. ) 90 tablet 3  . atorvastatin (LIPITOR) 80 MG tablet TAKE 1 TABLET BY MOUTH  DAILY 90 tablet 3  . Bromfenac Sodium (PROLENSA) 0.07 % SOLN Place 1 drop into the right eye 4 (four) times daily. 6 mL 2  . colesevelam (WELCHOL) 625 MG tablet Take 625 mg by mouth 3 (three) times daily.     Marland Kitchen donepezil (ARICEPT) 10 MG tablet Take 10 mg by mouth every morning.     . dorzolamide-timolol (COSOPT)  22.3-6.8 MG/ML ophthalmic solution Place 1 drop into the right eye 2 (two) times daily. 10 mL 1  . esomeprazole (NEXIUM) 40 MG capsule Take 40 mg by mouth daily.     . ferrous sulfate 325 (65 FE) MG tablet Take 325 mg by mouth 3 (three) times daily with meals.    . gabapentin (NEURONTIN) 300 MG capsule Take 300 mg by mouth 2 (two) times daily.     . insulin glargine (LANTUS SOLOSTAR) 100 UNIT/ML injection Inject 20-40 Units into the skin at bedtime as needed (BGL below 140-150 take 20 units, if 151 or higher take 40 units).     . lidocaine (LIDODERM) 5 % Place 1 patch onto the skin daily. Remove & Discard patch within 12 hours or as directed by MD 30 patch 0  . memantine (NAMENDA) 5 MG tablet Take 5 mg by mouth 2 (two) times daily.     . metoprolol tartrate (LOPRESSOR) 25 MG tablet Take 1 tablet (25 mg total) by mouth daily. 45 tablet 3  . mirabegron ER (MYRBETRIQ) 50 MG TB24 tablet Take 50 mg by mouth at bedtime.     Marland Kitchen omega-3 acid ethyl esters (LOVAZA) 1 G capsule Take 2 g by mouth daily.     . prednisoLONE acetate (PRED FORTE) 1 % ophthalmic suspension Place 1 drop into the right eye every 2 (two) hours while awake. 15 mL 1  . rOPINIRole (REQUIP) 2 MG  tablet Take 2 mg by mouth 3 (three) times daily as needed (RLS).     Marland Kitchen spironolactone (ALDACTONE) 25 MG tablet Take 0.5 tablets (12.5 mg total) by mouth daily. 45 tablet 3  . tamsulosin (FLOMAX) 0.4 MG CAPS capsule Take 0.4 mg by mouth daily after supper.     . torsemide (DEMADEX) 20 MG tablet Take 1 tablet (20 mg total) by mouth 2 (two) times daily as needed. 30 tablet 1  . valsartan-hydrochlorothiazide (DIOVAN-HCT) 80-12.5 MG tablet TAKE 1 TABLET BY MOUTH IN  THE MORNING (Patient taking differently: Take 1 tablet by mouth every morning. ) 90 tablet 3  . vitamin B-12 (CYANOCOBALAMIN) 1000 MCG tablet Take 1,000 mcg by mouth 2 (two) times daily.    Alveda Reasons 15 MG TABS tablet TAKE 1 TABLET BY MOUTH  EVERY EVENING AFTER DINNER (Patient taking  differently: Take 15 mg by mouth daily with supper. ) 90 tablet 3   No current facility-administered medications for this visit.     Allergies: No Known Allergies      Physical Exam:  Blood pressure 135/61, pulse (!) 53, temperature 97.6 F (36.4 C), temperature source Temporal, resp. rate 17, height 5\' 9"  (1.753 m), weight 218 lb 1.6 oz (98.9 kg), SpO2 99 %.    ECOG: 1    General appearance: Alert, awake without any distress. Head: Atraumatic without abnormalities Oropharynx: Without any thrush or ulcers. Eyes: No scleral icterus. Lymph nodes: No lymphadenopathy noted in the cervical, supraclavicular, or axillary nodes Heart:regular rate and rhythm, without any murmurs or gallops.   Lung: Clear to auscultation without any rhonchi, wheezes or dullness to percussion. Abdomin: Soft, nontender without any shifting dullness or ascites. Musculoskeletal: No clubbing or cyanosis. Neurological: No motor or sensory deficits. Skin: No rashes or lesions.      Lab Results: Lab Results  Component Value Date   WBC 5.6 09/12/2019   HGB 10.6 (L) 09/12/2019   HCT 33.0 (L) 09/12/2019   MCV 90.9 09/12/2019   PLT 170 09/12/2019     Chemistry      Component Value Date/Time   NA 137 09/12/2019 0956   K 3.7 09/12/2019 0956   CL 106 09/12/2019 0956   CO2 22 09/12/2019 0956   BUN 20 09/12/2019 0956   CREATININE 1.38 (H) 09/12/2019 0956      Component Value Date/Time   CALCIUM 9.0 09/12/2019 0956   ALKPHOS 47 05/28/2018 1345   AST 18 05/28/2018 1345   ALT 23 05/28/2018 1345   BILITOT 0.4 05/28/2018 1345          Impression and Plan:  79 year old man with:    1.  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   Laboratory data from today reviewed showed a hemoglobin of 10.2 but otherwise a normal CBC.  Iron studies are currently pending.  Risks and benefits of oral iron versus intravenous iron were reviewed again.  The role  for growth factor support such as Aranesp or Procrit were also discussed if his iron studies indicate normal iron with still low hemoglobin.  I recommended oral iron supplements for the time being we will consider intravenous iron if his oral iron is not effective.  2.  Prostate cancer presented with localized disease diagnosed in 2018.   No evidence of relapse at this time continues to follow under the care of Dr. Junious Silk.  3.  Atrial fibrillation: Continues to be on Xarelto without any recent exacerbation.  4.  Age-appropriate  cancer screening:   5.  Follow-up: Will be in 6 months for repeat evaluation.  30  minutes were spent on this visit.  The time was dedicated to reviewing laboratory data, disease status update, treatment options and future plan of care discussion.   Zola Button, MD 6/2/20212:50 PM

## 2020-01-02 ENCOUNTER — Encounter (INDEPENDENT_AMBULATORY_CARE_PROVIDER_SITE_OTHER): Payer: Self-pay | Admitting: Ophthalmology

## 2020-01-02 ENCOUNTER — Ambulatory Visit (INDEPENDENT_AMBULATORY_CARE_PROVIDER_SITE_OTHER): Payer: Medicare Other | Admitting: Ophthalmology

## 2020-01-02 ENCOUNTER — Telehealth: Payer: Self-pay | Admitting: Oncology

## 2020-01-02 DIAGNOSIS — H0014 Chalazion left upper eyelid: Secondary | ICD-10-CM

## 2020-01-02 DIAGNOSIS — I10 Essential (primary) hypertension: Secondary | ICD-10-CM

## 2020-01-02 DIAGNOSIS — Z961 Presence of intraocular lens: Secondary | ICD-10-CM

## 2020-01-02 DIAGNOSIS — E119 Type 2 diabetes mellitus without complications: Secondary | ICD-10-CM

## 2020-01-02 DIAGNOSIS — H2701 Aphakia, right eye: Secondary | ICD-10-CM | POA: Diagnosis not present

## 2020-01-02 DIAGNOSIS — Z9889 Other specified postprocedural states: Secondary | ICD-10-CM

## 2020-01-02 DIAGNOSIS — H35033 Hypertensive retinopathy, bilateral: Secondary | ICD-10-CM

## 2020-01-02 DIAGNOSIS — H02883 Meibomian gland dysfunction of right eye, unspecified eyelid: Secondary | ICD-10-CM

## 2020-01-02 DIAGNOSIS — H3321 Serous retinal detachment, right eye: Secondary | ICD-10-CM | POA: Diagnosis not present

## 2020-01-02 DIAGNOSIS — H3581 Retinal edema: Secondary | ICD-10-CM | POA: Diagnosis not present

## 2020-01-02 DIAGNOSIS — T8522XS Displacement of intraocular lens, sequela: Secondary | ICD-10-CM

## 2020-01-02 DIAGNOSIS — H35411 Lattice degeneration of retina, right eye: Secondary | ICD-10-CM

## 2020-01-02 DIAGNOSIS — H02886 Meibomian gland dysfunction of left eye, unspecified eyelid: Secondary | ICD-10-CM

## 2020-01-02 LAB — IRON AND TIBC
Iron: 57 ug/dL (ref 42–163)
Saturation Ratios: 17 % — ABNORMAL LOW (ref 20–55)
TIBC: 333 ug/dL (ref 202–409)
UIBC: 276 ug/dL (ref 117–376)

## 2020-01-02 LAB — FERRITIN: Ferritin: 219 ng/mL (ref 24–336)

## 2020-01-02 NOTE — Telephone Encounter (Signed)
Scheduled appt per 6/2 los.  Sent a secure chat to get a calendar mailed out. 

## 2020-02-05 NOTE — Progress Notes (Signed)
[facility] Note  02/07/2020     CHIEF COMPLAINT Patient presents for Retina Follow Up   HISTORY OF PRESENT ILLNESS: Cody Phelps is a 79 y.o. male who presents to the clinic today for:   HPI    Retina Follow Up    Patient presents with  Other.  In right eye.  Duration of 5 weeks.  Since onset it is stable.  I, the attending physician,  performed the HPI with the patient and updated documentation appropriately.          Comments    5 week follow up CME OD- Vision is a little better OD.  He does use 1 drop QID OD unsure which drop since his wife puts it in for him.       Last edited by [name], MD on 02/07/2020  4:00 PM. (History)     Patient states vision improved some OD. Using Pred Forte and prolensa qid OD.   Referring physician: [name], MD [place],  [place] [address]  HISTORICAL INFORMATION:   Selected notes from the [facility] Referred by Dr. [name] for concern of RD OD   CURRENT MEDICATIONS: Current Outpatient Medications (Ophthalmic Drugs)  Medication Sig  . Bromfenac Sodium (PROLENSA) 0.07 % SOLN Place 1 drop into the right eye 4 (four) times daily.  . dorzolamide-timolol (COSOPT) 22.3-6.8 MG/ML ophthalmic solution Place 1 drop into the right eye 2 (two) times daily.  . prednisoLONE acetate (PRED FORTE) 1 % ophthalmic suspension Place 1 drop into the right eye every 2 (two) hours while awake.   No current facility-administered medications for this visit. (Ophthalmic Drugs)   Current Outpatient Medications (Other)  Medication Sig  . amiodarone (PACERONE) 200 MG tablet TAKE 1 TABLET BY MOUTH  DAILY (Patient taking differently: Take 200 mg by mouth daily. )  . atorvastatin (LIPITOR) 80 MG tablet TAKE 1 TABLET BY MOUTH  DAILY  . colesevelam (WELCHOL) 625 MG tablet Take 625 mg by mouth 3 (three) times daily.   Marland Kitchen donepezil (ARICEPT) 10 MG tablet Take 10 mg by mouth every morning.   Marland Kitchen  esomeprazole (NEXIUM) 40 MG capsule Take 40 mg by mouth daily.   . ferrous sulfate 325 (65 FE) MG tablet Take 1 tablet (325 mg total) by mouth daily with breakfast.  . gabapentin (NEURONTIN) 300 MG capsule Take 300 mg by mouth 2 (two) times daily.   . insulin glargine (LANTUS SOLOSTAR) 100 UNIT/ML injection Inject 20-40 Units into the skin at bedtime as needed (BGL below 140-150 take 20 units, if 151 or higher take 40 units).   . lidocaine (LIDODERM) 5 % Place 1 patch onto the skin daily. Remove & Discard patch within 12 hours or as directed by MD  . memantine (NAMENDA) 5 MG tablet Take 5 mg by mouth 2 (two) times daily.   . metoprolol tartrate (LOPRESSOR) 25 MG tablet Take 1 tablet (25 mg total) by mouth daily.  . mirabegron ER (MYRBETRIQ) 50 MG TB24 tablet Take 50 mg by mouth at bedtime.   Marland Kitchen omega-3 acid ethyl esters (LOVAZA) 1 G capsule Take 2 g by mouth daily.   Marland Kitchen rOPINIRole (REQUIP) 2 MG tablet Take 2 mg by mouth 3 (three) times daily as needed (RLS).   Marland Kitchen spironolactone (ALDACTONE) 25 MG tablet Take 0.5 tablets (12.5 mg total) by mouth daily.  . tamsulosin (FLOMAX) 0.4 MG CAPS capsule Take 0.4 mg by mouth daily after supper.   Marland Kitchen  torsemide (DEMADEX) 20 MG tablet Take 1 tablet (20 mg total) by mouth 2 (two) times daily as needed.  . valsartan-hydrochlorothiazide (DIOVAN-HCT) 80-12.5 MG tablet TAKE 1 TABLET BY MOUTH IN  THE MORNING (Patient taking differently: Take 1 tablet by mouth every morning. )  . vitamin B-12 (CYANOCOBALAMIN) 1000 MCG tablet Take 1,000 mcg by mouth 2 (two) times daily.  Alveda Reasons 15 MG TABS tablet TAKE 1 TABLET BY MOUTH  EVERY EVENING AFTER DINNER (Patient taking differently: Take 15 mg by mouth daily with supper. )   No current facility-administered medications for this visit. (Other)      REVIEW OF SYSTEMS: ROS    Positive for: Musculoskeletal, Endocrine, Cardiovascular, Eyes, Respiratory   Negative for: Constitutional, Gastrointestinal, Neurological, Skin,  Genitourinary, HENT, Psychiatric, Allergic/Imm, Heme/Lymph   Last edited by Leonie Douglas, COA on 02/07/2020  2:10 PM. (History)       ALLERGIES No Known Allergies  PAST MEDICAL HISTORY Past Medical History:  Diagnosis Date  . Acute anterior wall MI ([place]) 03/01/2018  . Acute combined systolic and diastolic heart failure ([place]) 03/15/2018  . Bronchitis, acute 05/04/2012  . Burn   . Carotid stenosis, left followed by dr [name], cardiologist--- bilateral bruit per note   per dr [name] note 22/2979  LICA 89-21% stenosis per duplex 05-14-2014  . Chronic diastolic CHF (congestive heart failure) ([place]) 09/21/2018  . Dementia ([place])   . Diverticulosis   . DM (diabetes mellitus) type 2, uncontrolled, with ketoacidosis ([place]) 11/25/2013  . ED (erectile dysfunction)   . First degree heart block   . GERD (gastroesophageal reflux disease)   . Heart murmur   . History of blood transfusion    with open heart surgery  . Hypertension    cardiologsit-  dr [name]  . Hypertensive retinopathy    OU  . Iron deficiency anemia   . Mixed hyperlipidemia   . Morbid obesity ([place])   . Myocardial infarction ([place])    02/28/18  . OA (osteoarthritis)    right hip  . OSA on CPAP    followed by dr dohmeier, uses CPAP  . PAF (paroxysmal atrial fibrillation) ([name])    a. on Eliquis  . Prostate cancer ([place])    dx 03-17-2017 (bx)  Stage T2a, Gleason 4+4, PSA  4.43, vol 32.32--  s/p radiatctive prostate seed implants 07-10-2017 then IMRT and ADT  . Retinal detachment    Rheg. RD OS  . RLS (restless legs syndrome)   . S/P aortic valve replacement with bioprosthetic valve 05/30/2018   23 mm Edwards Inspiris Resilia stented bovine pericardial tissue valve  . 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 S/P Maze operation for atrial fibrillation 05/30/2018   Complete bilateral atrial lesion set using bipolar radiofrequency and cryothermy ablation with clipping of LA appendage  . Scoliosis   . Severe aortic  stenosis   . Trigger finger   . Type 2 diabetes mellitus ([place])   . Wears partial dentures    upper and lower   Past Surgical History:  Procedure Laterality Date  . AORTIC VALVE REPLACEMENT N/A 05/30/2018   Procedure: AORTIC VALVE REPLACEMENT (AVR) using Inspiris Valve, Size 23;  Surgeon: [name], MD;  Location: [place];  Service: Open Heart Surgery;  Laterality: N/A;  . APPENDECTOMY  1988  . BILATERAL TOTAL ETHMOIDECTOMY AND SPHENOIDECTOMY  01-14-2009    dr bates   [facility]  . CARDIOVERSION N/A 08/10/2018   Procedure: CARDIOVERSION;  Surgeon: [name],  Ulice Dash, MD;  Location: [place];  Service: Cardiovascular;  Laterality: N/A;  . CARPAL TUNNEL RELEASE Bilateral 2013  . CATARACT EXTRACTION Bilateral    Dr. [name]  . CATARACT EXTRACTION W/ INTRAOCULAR LENS  IMPLANT, BILATERAL  date?  . CORONARY ARTERY BYPASS GRAFT N/A 05/30/2018   Procedure: CORONARY ARTERY BYPASS GRAFTING (CABG) x one using left internal mammary artery;  Surgeon: [name], MD;  Location: [place];  Service: Open Heart Surgery;  Laterality: N/A;  . CORONARY BALLOON ANGIOPLASTY N/A 03/01/2018   Procedure: CORONARY BALLOON ANGIOPLASTY;  Surgeon: Adrian Prows, MD;  Location: [place] [facility];  Service: Cardiovascular;  Laterality: N/A;  . CORONARY/GRAFT ACUTE MI REVASCULARIZATION N/A 03/01/2018   Procedure: Coronary/Graft Acute MI Revascularization;  Surgeon: Adrian Prows, MD;  Location: [place] [facility];  Service: Cardiovascular;  Laterality: N/A;  . CYSTOSCOPY  07/19/2017   Procedure: CYSTOSCOPY;  Surgeon: Nickie Retort, MD;  Location: [facility];  Service: Urology;;  No seeds found in [place]  . EYE SURGERY Bilateral    Cat Sx OU.  Dislocated IOL sx OD.  Rheg. RD repair OS.  Marica Otter EXCHANGE Right 06/13/2019   Procedure: GAS/FLUID EXCHANGE;  Surgeon: [name], MD;  Location: [place];  Service: Ophthalmology;  Laterality: Right;  . LAMINECTOMY WITH POSTERIOR LATERAL ARTHRODESIS LEVEL 2 N/A  02/12/2014   Procedure: LUMBAR TWO-THREE,LUIMBAR THREE-FOUR LAMINECTOMY/FORAMINOTOMY;POSSIBLE POSTEROLATERAL ARTHRODESIS WITH AUTOGRAFT;  Surgeon: [name], MD;  Location: [facility];  Service: Neurosurgery;  Laterality: N/A;  . LEFT HEART CATH AND CORONARY ANGIOGRAPHY N/A 03/01/2018   Procedure: LEFT HEART CATH AND CORONARY ANGIOGRAPHY;  Surgeon: Adrian Prows, MD;  Location: [place] [facility];  Service: Cardiovascular;  Laterality: N/A;  . MAZE N/A 05/30/2018   Procedure: MAZE;  Surgeon: [name], MD;  Location: [place];  Service: Open Heart Surgery;  Laterality: N/A;  . ORIF RIGHT ANKLE FX'S  05/05/2001   retained hardware  . PARS PLANA VITRECTOMY Right 06/13/2019   Procedure: PARS PLANA VITRECTOMY WITH 25 GAUGE WITH LASER AND GAS;  Surgeon: [name], MD;  Location: [place];  Service: Ophthalmology;  Laterality: Right;  . PARS PLANA VITRECTOMY Right 09/12/2019   Procedure: [place] Vitrectomy With 25 Gauge;  Surgeon: [name], MD;  Location: [place];  Service: Ophthalmology;  Laterality: Right;  . [place] INJECTION Right 06/13/2019   Procedure: PERFLUORONE INJECTION;  Surgeon: [name], MD;  Location: [facility];  Service: Ophthalmology;  Laterality: Right;  . PHOTOCOAGULATION WITH LASER Right 06/13/2019   Procedure: PHOTOCOAGULATION WITH LASER;  Surgeon: [name], MD;  Location: [facility];  Service: Ophthalmology;  Laterality: Right;  . PLACEMENT AND SUTURE OF SECONDARY INTRAOCULAR LENS Right 09/12/2019   Procedure: PLACEMENT AND SUTURE OF SECONDARY INTRAOCULAR LENS;  Surgeon: [name], MD;  Location: [place];  Service: Ophthalmology;  Laterality: Right;  . PROSTATE BIOPSY  03-17-2017   dr Pilar Jarvis office  . RADIOACTIVE SEED IMPLANT N/A 07/19/2017   Procedure: RADIOACTIVE SEED IMPLANT/BRACHYTHERAPY IMPLANT;  Surgeon: Nickie Retort, MD;  Location: [facility];  Service: Urology;  Laterality: N/A;  69 seeds implanted  . REMOVAL RETAINED LENS Right  09/12/2019   Procedure: REMOVAL RETAINED LENS;  Surgeon: [name], MD;  Location: [place];  Service: Ophthalmology;  Laterality: Right;  . RETINAL DETACHMENT SURGERY Right 06/13/2019   Dr. Coralyn Pear  . SKIN GRAFT     face  . SPACE OAR INSTILLATION N/A 07/19/2017   Procedure: SPACE OAR  INSTILLATION;  Surgeon: Nickie Retort, MD;  Location: [facility];  Service: Urology;  Laterality: N/A;  . TEE WITHOUT CARDIOVERSION  03/20/2012   Procedure: TRANSESOPHAGEAL ECHOCARDIOGRAM (TEE);  Surgeon: Laverda Page, MD;  Location: [facility];  Service: Cardiovascular;  Laterality: N/A;  normal LV; normal EF; normal RV; normal LA w/ left atrial appendage very small, normal function, interatrial septum intact without defect; normal RA; trace MR,TR, & PI; mild AV calcification and senile degeneration w/ mild stenosis, AVA 1.7cm^2;;   . TEE WITHOUT CARDIOVERSION N/A 05/30/2018   Procedure: TRANSESOPHAGEAL ECHOCARDIOGRAM (TEE);  Surgeon: [name], MD;  Location: [place];  Service: Open Heart Surgery;  Laterality: N/A;  . TOTAL HIP ARTHROPLASTY Right 10/23/2017   Procedure: TOTAL HIP ARTHROPLASTY ANTERIOR APPROACH;  Surgeon: Frederik Pear, MD;  Location: [place];  Service: Orthopedics;  Laterality: Right;  . TRANSTHORACIC ECHOCARDIOGRAM  01-11-2017   dr [name]  (per echo note, no significant change in seveity of AS, no other diagnostic change)   moderate concentric LVH, ef 13%, grade 1 diastolic dysfunction/  moderate LAE/  mild , grade 1 AR w/ moderate AV calcification, mild to moderate restricted AV leaflets w/ moderate AS, AVA 1.16cm^2, peak grandient 8mHg, mean grandient 367mg/  trace MR, mild calcification MV annulus , mild MV leaflet calcification, mild MVS, peak grandient 4.76m92m, mean grandient 2.7mm12m trace TR    FAMILY HISTORY Family History  Problem Relation Age of Onset  . Stroke Mother   . Hypertension Mother   . Heart attack Father   . Heart murmur Father   .  Hypertension Sister   . Colon cancer Neg Hx   . Stomach cancer Neg Hx   . Rectal cancer Neg Hx   . Pancreatic cancer Neg Hx     SOCIAL HISTORY Social History   Tobacco Use  . Smoking status: Former Smoker    Years: 1.00    Types: Cigarettes    Quit date: 02/04/1967    Years since quitting: 53.0  . Smokeless tobacco: Never Used  . Tobacco comment: smoked 1 q2-3 days  Vaping Use  . Vaping Use: Never used  Substance Use Topics  . Alcohol use: Yes    Comment: occasional beer  . Drug use: No         OPHTHALMIC EXAM:  Base Eye Exam    Visual Acuity (Snellen - Linear)      Right Left   Dist [place] 20/50 +1 20/25 +2   Dist ph [place] 20/30 +2        Tonometry (Tonopen, 2:16 PM)      Right Left   Pressure 14 15       Pupils      Dark Light Shape React APD   Right 4 3 Round Minimal None   Left 3 2 Round Brisk None       Visual Fields (Counting fingers)      Left Right    Full Full       Extraocular Movement      Right Left    Full Full       Neuro/Psych    Oriented x3: Yes   Mood/Affect: Normal       Dilation    Both eyes: 1.0% Mydriacyl, 2.5% Phenylephrine @ 2:18 PM        Slit Lamp and Fundus Exam    External Exam      Right Left   External Periorbital edema  Slit Lamp Exam      Right Left   Lids/Lashes mild Meibomian gland dysfunction, mild Telangiectasia Dermatochalasis - upper lid, Meibomian gland dysfunction, chalazion upper lid   Conjunctiva/Sclera White and quiet Temporal Pinguecula   Cornea well healed superior corneal wound, sutures gone; 1+Punctate epithelial erosions, Arcus, mild tear film debris 1+ Punctate epithelial erosions, +verticellata, trace endo pigment, Debris in tear film   Anterior Chamber Deep, clear Deep and quiet   Iris Round and poorly dilated to 37m, scattered Transillumination defects 360, +iridodenesis Round and dilated, scattered, 360 Transillumination defects   Lens Sutured Akreos IOL well centered  3 piece Posterior  chamber intraocular in good position, trace Posterior capsular opacification   Vitreous post vitrectomy Vitreous syneresis, Posterior vitreous detachment       Fundus Exam      Right Left   Disc 1+Pallor, Sharp rim Mild Pallor, Sharp rim, temporal PPP, mild tilt   C/D Ratio 0.3 0.4   Macula Blunted foveal reflex, mild RPE mottling and clumping, mild central cystic changes -- slightly increased, scattered MA Flat, Blunted foveal reflex, Retinal pigment epithelial mottling and clumping, No heme or edema   Vessels Vascular attenuation, mild Tortuousity, mild A/V crossing changes Vascular attenuation, Tortuous   Periphery attached, good 360 laser in place; ORIGINALLY: bullous superior retinal detachment from 1000-0130, another more shallow detachment lobe from 130-400, lattice and micro tears ST quadrant; Old retinal tear at 0900 with surrounding laser, Attached, peripheral laser scars at 1030         Refraction    Manifest Refraction (Auto)      Sphere Cylinder Axis Dist VA   Right -1.50 +2.25 023 20/40   Left -0.75 +1.25 175 20/25  AR K'S OD: 44.50 _0 /42.25 _1  = +2.25 _2               OS: 44.00 2180/44.00 _3  = 0.00 Refraction done post-dilation          IMAGING AND PROCEDURES  Imaging and Procedures for _4 @  OCT, Retina - OU - Both Eyes       Right Eye Quality was good. Central Foveal Thickness: 300. Progression has worsened. Findings include normal foveal contour, no SRF, intraretinal fluid (Interval increase in IRF).   Left Eye Quality was good. Central Foveal Thickness: 264. Progression has been stable. Findings include normal foveal contour, no IRF, no SRF.   Notes *Images captured and stored on drive  Diagnosis / Impression:  OD: Retina re-attached; interval increase in nasal CME OS: NFP, no IRF/SRF   Clinical management:  See below  Abbreviations: NFP - Normal foveal profile. CME - cystoid macular edema. PED - pigment epithelial detachment. IRF -  intraretinal fluid. SRF - subretinal fluid. EZ - ellipsoid zone. ERM - epiretinal membrane. ORA - outer retinal atrophy. ORT - outer retinal tubulation. SRHM - subretinal hyper-reflective material        Injection into Tenon's Capsule - OD - Right Eye       Time Out 02/07/2020. 3:14 PM. Confirmed correct patient, procedure, site, and patient consented.   Anesthesia Topical anesthesia was used. Anesthetic medications included Lidocaine 2%, Proparacaine 0.5%.   Procedure Preparation included 5% betadine to ocular surface. A 27 gauge needle was used.   Injection:  4 mg KENALOG-447mml injection 26m626m NDC: [id number]: [id number] date: 05/30/2020   Route: Other, Site: Right Eye  Post-op Post injection exam found visual acuity of at least counting fingers. The patient tolerated the procedure well. There were  no complications. The patient received written and verbal post procedure care education.   Notes 0.8 cc of Kenalog-40 (32 mg) injected into subtenon's capsule in the superotemporal quadrant. Betadine was applied to Injection area pre and post-injection then rinsed with sterile BSS. 1 drop of polymixin was instilled into the eye. There were no complications. Pt tolerated procedure well.                 ASSESSMENT/PLAN:    ICD-10-CM   1. Dislocation of intraocular lens, sequela  T85.22XS   2. Aphakia of right eye  H27.01   3. Right retinal detachment  H33.21   4. 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 Retinal edema  H35.81 OCT, Retina - OU - Both Eyes  6. History of repair of retinal tear by laser photocoagulation  Z98.890   7. Diabetes mellitus type 2 without retinopathy ([place])  E11.9   8. Essential hypertension  I10   9. Hypertensive retinopathy of both eyes  H35.033   10. Pseudophakia of both eyes  Z96.1   11. Cystoid macular edema of right eye  H35.351 Injection into Tenon's Capsule - OD - Right Eye    triamcinolone acetonide (KENALOG-40)  injection 4 mg   1,2. Dislocated IOL OD  - pt with history of partially dislocated 3-piece IOL -- spontaneously dislocated completely into vitreous cavity on 1.23.21  - s/p 25g PPV w/ IOL explantation and secondary sutured IOL OD -- Akreos AO60 lens, 17.5D -- 02.11.2021             - IOL in good position  - 2 superior nylon sutures removed at slit lamp 03.12.21 -- 1 nylon suture remains at 1200 -- removed at slit lamp, 04.30.21  - corneal edema resolved; no epi defect; mild PEE  - BCVA stable at 20/30+2  - OCT shows interval increase in IRF/CME nasal macula             - IOP okay at 14  - recommend STK OD today  - RBA of procedure discussed, questions answered  - informed consent obtained and signed  - see procedure note             - cont PF and Prolensa qid OD  - ATs QID OU              - f/u 4-6 weeks    3-5. Rhegmatogenous retinal detachment, right eye  - bullous, superior, mac off detachment, onset of foveal involvement 11.11.20 by history  - Bi-lobed superior detachment, superior lobe spanning 1000-0130, nasal lobe 0130-0400  - lattice degeneration with microtears noted at 1030  - s/p PPV/PFC/EL/FAX/14% C3F8 OD, 11.12.20             - intraop: HST at 2 oclock was found; also detachment had progressed to subtotal detachment spanning 9 oclock to 6 oclock (going in clockwise direction); also severe zonular insufficiency with IOL very mobile throughout case -- did not dislocate completely during the case or immediately post op  - doing well              - retina attached and in good position--good laser in place  6. History of retinal defects s/p laser retinopexy OU with Dr. Zigmund Daniel in 2013  - OD laser at 0900  - OS laser at 1030  - stable  7. Diabetes mellitus, type 2 without retinopathy  - The incidence, risk factors for progression, natural history and treatment options for diabetic retinopathy  were discussed with patient.    - The need for close monitoring of blood glucose,  blood pressure, and serum lipids, avoiding cigarette or any type of tobacco, and the need for long term follow up was also discussed with patient  8,9. Hypertensive retinopathy OU  - discussed importance of tight BP control  - monitor  10. Pseudophakia OU  - s/p CE/IOL OU (Dr. [name])  - 3 piece IOL OD was completely displaced into vitreous -- now with sutured Akreos IOL in good position -- see above  - OS 3-piece IOL in good position  - monitor  11. CME OD  - see above  - likely post op edema  - mild worsening on PF and prolensa QID OD  - recommend STK OD as above  Ophthalmic Meds Ordered this visit:  Meds ordered this encounter  Medications  . triamcinolone acetonide (KENALOG-40) injection 4 mg       Return 4-6 weeks, for DFE, OCT.  There are no Patient Instructions on file for this visit.   Explained the diagnoses, plan, and follow up with the patient and they expressed understanding.  Patient expressed understanding of the importance of proper follow up care.  This document serves as a record of services personally performed by Gardiner Sleeper, MD, PhD. It was created on their behalf by Roselee Nova, COMT. The creation of this record is the provider's dictation and/or activities during the visit.  Electronically signed by: Roselee Nova, COMT 02/07/20 4:15 PM  Gardiner Sleeper, M.D., Ph.D. Diseases & Surgery of the Retina and Vitreous [facility]  I have reviewed the above documentation for accuracy and completeness, and I agree with the above. Gardiner Sleeper, M.D., Ph.D. 02/07/20 4:15 PM   Abbreviations: M myopia (nearsighted); A astigmatism; H hyperopia (farsighted); P presbyopia; Mrx spectacle prescription;  CTL contact lenses; OD right eye; OS left eye; OU both eyes  XT exotropia; ET esotropia; PEK punctate epithelial keratitis; PEE punctate epithelial erosions; DES dry eye syndrome; MGD meibomian gland dysfunction; ATs artificial tears;  PFAT's preservative free artificial tears; [place] nuclear sclerotic cataract; PSC posterior subcapsular cataract; ERM epi-retinal membrane; PVD posterior vitreous detachment; RD retinal detachment; DM diabetes mellitus; DR diabetic retinopathy; NPDR non-proliferative diabetic retinopathy; PDR proliferative diabetic retinopathy; CSME clinically significant macular edema; DME diabetic macular edema; dbh dot blot hemorrhages; CWS cotton wool spot; POAG primary open angle glaucoma; C/D cup-to-disc ratio; HVF humphrey visual field; GVF goldmann visual field; OCT optical coherence tomography; IOP intraocular pressure; BRVO Branch retinal vein occlusion; CRVO central retinal vein occlusion; CRAO central retinal artery occlusion; BRAO branch retinal artery occlusion; RT retinal tear; SB scleral buckle; PPV pars plana vitrectomy; VH Vitreous hemorrhage; PRP panretinal laser photocoagulation; IVK intravitreal kenalog; VMT vitreomacular traction; MH Macular hole;  NVD neovascularization of the disc; NVE neovascularization elsewhere; AREDS age related eye disease study; ARMD age related macular degeneration; POAG primary open angle glaucoma; EBMD epithelial/anterior basement membrane dystrophy; ACIOL anterior chamber intraocular lens; IOL intraocular lens; PCIOL posterior chamber intraocular lens; Phaco/IOL phacoemulsification with intraocular lens placement; [place] photorefractive keratectomy; LASIK laser assisted in situ keratomileusis; HTN hypertension; DM diabetes mellitus; COPD chronic obstructive pulmonary disease

## 2020-02-07 ENCOUNTER — Encounter (INDEPENDENT_AMBULATORY_CARE_PROVIDER_SITE_OTHER): Payer: Self-pay | Admitting: Ophthalmology

## 2020-02-07 ENCOUNTER — Other Ambulatory Visit: Payer: Self-pay

## 2020-02-07 ENCOUNTER — Ambulatory Visit (INDEPENDENT_AMBULATORY_CARE_PROVIDER_SITE_OTHER): Payer: Medicare Other | Admitting: Ophthalmology

## 2020-02-07 DIAGNOSIS — H35033 Hypertensive retinopathy, bilateral: Secondary | ICD-10-CM

## 2020-02-07 DIAGNOSIS — H35351 Cystoid macular degeneration, right eye: Secondary | ICD-10-CM

## 2020-02-07 DIAGNOSIS — T8522XS Displacement of intraocular lens, sequela: Secondary | ICD-10-CM

## 2020-02-07 DIAGNOSIS — Z961 Presence of intraocular lens: Secondary | ICD-10-CM

## 2020-02-07 DIAGNOSIS — H3321 Serous retinal detachment, right eye: Secondary | ICD-10-CM | POA: Diagnosis not present

## 2020-02-07 DIAGNOSIS — H35411 Lattice degeneration of retina, right eye: Secondary | ICD-10-CM

## 2020-02-07 DIAGNOSIS — I10 Essential (primary) hypertension: Secondary | ICD-10-CM

## 2020-02-07 DIAGNOSIS — H2701 Aphakia, right eye: Secondary | ICD-10-CM | POA: Diagnosis not present

## 2020-02-07 DIAGNOSIS — E119 Type 2 diabetes mellitus without complications: Secondary | ICD-10-CM

## 2020-02-07 DIAGNOSIS — H3581 Retinal edema: Secondary | ICD-10-CM | POA: Diagnosis not present

## 2020-02-07 DIAGNOSIS — Z9889 Other specified postprocedural states: Secondary | ICD-10-CM

## 2020-02-07 MED ORDER — TRIAMCINOLONE ACETONIDE 40 MG/ML IJ SUSP FOR KALEIDOSCOPE
4.0000 mg | INTRAMUSCULAR | Status: AC | PRN
  Administered 2020-02-07: 4 mg

## 2020-02-20 ENCOUNTER — Encounter: Payer: Self-pay | Admitting: Physician Assistant

## 2020-02-20 ENCOUNTER — Other Ambulatory Visit: Payer: Self-pay

## 2020-02-20 ENCOUNTER — Ambulatory Visit: Payer: Medicare Other | Admitting: Cardiology

## 2020-02-20 ENCOUNTER — Telehealth: Payer: Self-pay | Admitting: *Deleted

## 2020-02-20 ENCOUNTER — Ambulatory Visit (INDEPENDENT_AMBULATORY_CARE_PROVIDER_SITE_OTHER): Payer: Medicare Other | Admitting: Physician Assistant

## 2020-02-20 ENCOUNTER — Encounter: Payer: Self-pay | Admitting: Cardiology

## 2020-02-20 VITALS — BP 128/58 | HR 56 | Ht 66.0 in | Wt 214.0 lb

## 2020-02-20 VITALS — BP 140/75 | HR 55 | Resp 16 | Ht 66.0 in | Wt 215.8 lb

## 2020-02-20 DIAGNOSIS — I480 Paroxysmal atrial fibrillation: Secondary | ICD-10-CM

## 2020-02-20 DIAGNOSIS — K625 Hemorrhage of anus and rectum: Secondary | ICD-10-CM | POA: Diagnosis not present

## 2020-02-20 DIAGNOSIS — I25118 Atherosclerotic heart disease of native coronary artery with other forms of angina pectoris: Secondary | ICD-10-CM

## 2020-02-20 DIAGNOSIS — I10 Essential (primary) hypertension: Secondary | ICD-10-CM

## 2020-02-20 DIAGNOSIS — Z953 Presence of xenogenic heart valve: Secondary | ICD-10-CM

## 2020-02-20 DIAGNOSIS — Z7901 Long term (current) use of anticoagulants: Secondary | ICD-10-CM | POA: Diagnosis not present

## 2020-02-20 DIAGNOSIS — I5032 Chronic diastolic (congestive) heart failure: Secondary | ICD-10-CM

## 2020-02-20 DIAGNOSIS — D509 Iron deficiency anemia, unspecified: Secondary | ICD-10-CM | POA: Diagnosis not present

## 2020-02-20 MED ORDER — TORSEMIDE 20 MG PO TABS: 20.0000 mg | ORAL_TABLET | Freq: Two times a day (BID) | ORAL | 3 refills | Status: DC | PRN

## 2020-02-20 NOTE — Progress Notes (Signed)
Chief Complaint: Hemorrhoids, rectal bleeding, iron deficiency anemia  HPI:    Cody Phelps is a 79 year old Caucasian male known to Dr. Rachael Darby for his iron deficiency anemia, with a past medical history of aortic valve replacement (echo 07/11/2018 with an EF of 65% and bioprosthetic aortic valve which is new), maintained on Xarelto for A. fib, who returns to clinic today for his hemorrhoids, rectal bleeding and iron deficiency anemia.    02/25/2019 patient was seen in clinic by Dr. Loletha Carrow.  At that time they discussed his anemia at length, it was thought to be multifactorial but it was recommended that the patient have a colonoscopy to evaluate any other cause of the lower GI bleeding.  He had not had one in many years.  Patient never scheduled this.    01/01/2020 CBC with a hemoglobin of 10.2, iron studies with a percent saturation low at 17.    01/01/2020 patient saw Dr. [name] with hematology oncology for his anemia.  At that time patient was restarted on oral iron.  Also discussed his prostate cancer which presented with localized disease diagnosed in 2018, there was no evidence of relapse.    Today, the patient and his wife present to clinic and tell me that he continues to see occasional bright red blood in his stool but there has been no change since seeing Dr. Loletha Carrow initially.  Patient does tell me that he had a few "darker than normal" stools over the weekend but he did start oral iron a couple of weeks ago.  He has not seen any recently and has not seen any blood in his stool recently.  Does still have some days of constipation.  He and his wife are aware of recommendations for endoscopic procedures and are willing to proceed now.    Denies fever, chills, weight loss, change in bowel habits or symptoms that awaken him from sleep.  Past Medical History:  Diagnosis Date   Acute anterior wall MI ([place]) 03/01/2018   Acute combined systolic and diastolic heart failure ([place]) 03/15/2018   Bronchitis,  acute 05/04/2012   Burn    Carotid stenosis, left followed by dr [name], cardiologist--- bilateral bruit per note   per dr [name] note 19/1660  LICA 60-04% stenosis per duplex 05-14-2014   Chronic diastolic CHF (congestive heart failure) ([place]) 09/21/2018   Defect, retina, with detachment    Dementia ([place])    Diverticulosis    DM (diabetes mellitus) type 2, uncontrolled, with ketoacidosis ([place]) 11/25/2013   ED (erectile dysfunction)    First degree heart block    GERD (gastroesophageal reflux disease)    Heart murmur    History of blood transfusion    with open heart surgery   Hypertension    cardiologsit-  dr [name]   Hypertensive retinopathy    OU   Iron deficiency anemia    Mixed hyperlipidemia    Morbid obesity ([place])    Myocardial infarction ([place])    02/28/18   OA (osteoarthritis)    right hip   OSA on CPAP    followed by dr dohmeier, uses CPAP   PAF (paroxysmal atrial fibrillation) ([place])    a. on Eliquis   Prostate cancer ([place])    dx 03-17-2017 (bx)  Stage T2a, Gleason 4+4, PSA  4.43, vol 32.32--  s/p radiatctive prostate seed implants 07-10-2017 then IMRT and ADT   Retinal detachment    Rheg. 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   LIMA to Diagonal Branch   S/P Maze operation for atrial fibrillation 05/30/2018   Complete bilateral atrial lesion set using bipolar radiofrequency and cryothermy ablation with clipping of LA appendage   Scoliosis    Severe aortic stenosis    Trigger finger    Type 2 diabetes mellitus ([place])    Wears partial dentures    upper and lower    Past Surgical History:  Procedure Laterality Date   AORTIC VALVE REPLACEMENT N/A 05/30/2018   Procedure: AORTIC VALVE REPLACEMENT (AVR) using Inspiris Valve, Size 23;  Surgeon: [name], MD;  Location:  [place];  Service: Open Heart Surgery;  Laterality: N/A;   APPENDECTOMY  1988   BILATERAL TOTAL ETHMOIDECTOMY AND SPHENOIDECTOMY  01-14-2009    dr Redmond Baseman   [facility]   CARDIOVERSION N/A 08/10/2018   Procedure: CARDIOVERSION;  Surgeon: Adrian Prows, MD;  Location: [place];  Service: Cardiovascular;  Laterality: N/A;   CARPAL TUNNEL RELEASE Bilateral 2013   CATARACT EXTRACTION Bilateral    Dr. [name]   CATARACT EXTRACTION W/ INTRAOCULAR LENS  IMPLANT, BILATERAL  date?   CORONARY ARTERY BYPASS GRAFT N/A 05/30/2018   Procedure: CORONARY ARTERY BYPASS GRAFTING (CABG) x one using left internal mammary artery;  Surgeon: [name], MD;  Location: [place];  Service: Open Heart Surgery;  Laterality: N/A;   CORONARY BALLOON ANGIOPLASTY N/A 03/01/2018   Procedure: CORONARY BALLOON ANGIOPLASTY;  Surgeon: Adrian Prows, MD;  Location: [place] [facility];  Service: Cardiovascular;  Laterality: N/A;   CORONARY/GRAFT ACUTE MI REVASCULARIZATION N/A 03/01/2018   Procedure: Coronary/Graft Acute MI Revascularization;  Surgeon: Adrian Prows, MD;  Location: [place] [facility];  Service: Cardiovascular;  Laterality: N/A;   CYSTOSCOPY  07/19/2017   Procedure: CYSTOSCOPY;  Surgeon: Nickie Retort, MD;  Location: [facility];  Service: Urology;;  No seeds found in [place] Bilateral    Cat Sx OU.  Dislocated IOL sx OD.  Rheg. RD repair OS.   EYE SURGERY     GAS/FLUID EXCHANGE Right 06/13/2019   Procedure: GAS/FLUID EXCHANGE;  Surgeon: [name], MD;  Location: [place];  Service: Ophthalmology;  Laterality: Right;   LAMINECTOMY WITH POSTERIOR LATERAL ARTHRODESIS LEVEL 2 N/A 02/12/2014   Procedure: LUMBAR TWO-THREE,LUIMBAR THREE-FOUR LAMINECTOMY/FORAMINOTOMY;POSSIBLE POSTEROLATERAL ARTHRODESIS WITH AUTOGRAFT;  Surgeon: [name], MD;  Location: [facility];  Service: Neurosurgery;  Laterality: N/A;   LEFT HEART CATH AND CORONARY ANGIOGRAPHY N/A 03/01/2018   Procedure: LEFT  HEART CATH AND CORONARY ANGIOGRAPHY;  Surgeon: Adrian Prows, MD;  Location: [place] [facility];  Service: Cardiovascular;  Laterality: N/A;   MAZE N/A 05/30/2018   Procedure: MAZE;  Surgeon: [name], MD;  Location: [place];  Service: Open Heart Surgery;  Laterality: N/A;   ORIF RIGHT ANKLE FX'S  05/05/2001   retained hardware   PARS PLANA VITRECTOMY Right 06/13/2019   Procedure: PARS PLANA VITRECTOMY WITH 25 GAUGE WITH LASER AND GAS;  Surgeon: [name], MD;  Location: [place];  Service: Ophthalmology;  Laterality: Right;   PARS PLANA VITRECTOMY Right 09/12/2019   Procedure: Pars Plana Vitrectomy With 25 Gauge;  Surgeon: [name], MD;  Location: [place];  Service: Ophthalmology;  Laterality: Right;   PERFLUORONE INJECTION Right 06/13/2019   Procedure: PERFLUORONE INJECTION;  Surgeon: [name], MD;  Location: [facility];  Service: Ophthalmology;  Laterality: Right;   PHOTOCOAGULATION WITH LASER Right 06/13/2019   Procedure: PHOTOCOAGULATION WITH LASER;  Surgeon: [name], MD;  Location: [place];  Service: Ophthalmology;  Laterality: Right;   PLACEMENT AND SUTURE OF SECONDARY INTRAOCULAR LENS Right 09/12/2019   Procedure: PLACEMENT AND SUTURE OF SECONDARY INTRAOCULAR LENS;  Surgeon: [name], MD;  Location: [place];  Service: Ophthalmology;  Laterality: Right;   PROSTATE BIOPSY  03-17-2017   dr Pilar Jarvis office   RADIOACTIVE SEED IMPLANT N/A 07/19/2017   Procedure: RADIOACTIVE SEED IMPLANT/BRACHYTHERAPY IMPLANT;  Surgeon: Nickie Retort, MD;  Location: [facility];  Service: Urology;  Laterality: N/A;  69 seeds implanted   REMOVAL RETAINED LENS Right 09/12/2019   Procedure: REMOVAL RETAINED LENS;  Surgeon: [name], MD;  Location: [place];  Service: Ophthalmology;  Laterality: Right;   RETINAL DETACHMENT SURGERY Right 06/13/2019   Dr. Coralyn Pear   SKIN GRAFT     face   SPACE OAR INSTILLATION N/A 07/19/2017   Procedure: SPACE OAR INSTILLATION;  Surgeon:  Nickie Retort, MD;  Location: [facility];  Service: Urology;  Laterality: N/A;   TEE WITHOUT CARDIOVERSION  03/20/2012   Procedure: TRANSESOPHAGEAL ECHOCARDIOGRAM (TEE);  Surgeon: Laverda Page, MD;  Location[facility];  Service: Cardiovascular;  Laterality: N/A;  normal LV; normal EF; normal RV; normal LA w/ left atrial appendage very small, normal function, interatrial septum intact without defect; normal RA; trace MR,TR, & PI; mild AV calcification and senile degeneration w/ mild stenosis, AVA 1.7cm^2;;    TEE WITHOUT CARDIOVERSION N/A 05/30/2018   Procedure: TRANSESOPHAGEAL ECHOCARDIOGRAM (TEE);  Surgeon: [name], MD;  Location: [place];  Service: Open Heart Surgery;  Laterality: N/A;   TOTAL HIP ARTHROPLASTY Right 10/23/2017   Procedure: TOTAL HIP ARTHROPLASTY ANTERIOR APPROACH;  Surgeon: Frederik Pear, MD;  Location: [place];  Service: Orthopedics;  Laterality: Right;   TRANSTHORACIC ECHOCARDIOGRAM  01-11-2017   dr [name]  (per echo note, no significant change in seveity of AS, no other diagnostic change)   moderate concentric LVH, ef 84%, grade 1 diastolic dysfunction/  moderate LAE/  mild , grade 1 AR w/ moderate AV calcification, mild to moderate restricted AV leaflets w/ moderate AS, AVA 1.16cm^2, peak grandient 70mmHg, mean grandient 65mmHg/  trace MR, mild calcification MV annulus , mild MV leaflet calcification, mild MVS, peak grandient 4.34mmHg, mean grandient 2.33mmHg/ trace TR    Current Outpatient Medications  Medication Sig Dispense Refill   amiodarone (PACERONE) 200 MG tablet TAKE 1 TABLET BY MOUTH  DAILY (Patient taking differently: Take 200 mg by mouth daily. ) 90 tablet 3   atorvastatin (LIPITOR) 80 MG tablet TAKE 1 TABLET BY MOUTH  DAILY 90 tablet 3   Bromfenac Sodium (PROLENSA) 0.07 % SOLN Place 1 drop into the right eye 4 (four) times daily. 6 mL 2   colesevelam (WELCHOL) 625 MG tablet Take 625 mg by mouth 3 (three) times daily.       donepezil (ARICEPT) 10 MG tablet Take 10 mg by mouth every morning.      dorzolamide-timolol (COSOPT) 22.3-6.8 MG/ML ophthalmic solution Place 1 drop into the right eye 2 (two) times daily. 10 mL 1   esomeprazole (NEXIUM) 40 MG capsule Take 40 mg by mouth daily.      ferrous sulfate 325 (65 FE) MG tablet Take 1 tablet (325 mg total) by mouth daily with breakfast. 90 tablet 3   gabapentin (NEURONTIN) 300 MG capsule Take 300  mg by mouth 2 (two) times daily.      insulin glargine (LANTUS SOLOSTAR) 100 UNIT/ML injection Inject 20-40 Units into the skin at bedtime as needed (BGL below 140-150 take 20 units, if 151 or higher take 40 units).      lidocaine (LIDODERM) 5 % Place 1 patch onto the skin daily. Remove & Discard patch within 12 hours or as directed by MD 30 patch 0   memantine (NAMENDA) 5 MG tablet Take 5 mg by mouth 2 (two) times daily.      metoprolol tartrate (LOPRESSOR) 25 MG tablet Take 1 tablet (25 mg total) by mouth daily. 45 tablet 3   mirabegron ER (MYRBETRIQ) 50 MG TB24 tablet Take 50 mg by mouth at bedtime.      omega-3 acid ethyl esters (LOVAZA) 1 G capsule Take 2 g by mouth daily.      prednisoLONE acetate (PRED FORTE) 1 % ophthalmic suspension Place 1 drop into the right eye every 2 (two) hours while awake. 15 mL 1   rOPINIRole (REQUIP) 2 MG tablet Take 2 mg by mouth 3 (three) times daily as needed (RLS).      spironolactone (ALDACTONE) 25 MG tablet Take 0.5 tablets (12.5 mg total) by mouth daily. 45 tablet 3   tamsulosin (FLOMAX) 0.4 MG CAPS capsule Take 0.4 mg by mouth daily after supper.      valsartan-hydrochlorothiazide (DIOVAN-HCT) 80-12.5 MG tablet TAKE 1 TABLET BY MOUTH IN  THE MORNING (Patient taking differently: Take 1 tablet by mouth every morning. ) 90 tablet 3   vitamin B-12 (CYANOCOBALAMIN) 1000 MCG tablet Take 1,000 mcg by mouth 2 (two) times daily.     XARELTO 15 MG TABS tablet TAKE 1 TABLET BY MOUTH  EVERY EVENING AFTER DINNER (Patient taking  differently: Take 15 mg by mouth daily with supper. ) 90 tablet 3   torsemide (DEMADEX) 20 MG tablet Take 1 tablet (20 mg total) by mouth 2 (two) times daily as needed. 30 tablet 1   No current facility-administered medications for this visit.    Allergies as of 02/20/2020   (No Known Allergies)    Family History  Problem Relation Age of Onset   Stroke Mother    Hypertension Mother    Heart attack Father    Heart murmur Father    Hypertension Sister    Colon cancer Neg Hx    Stomach cancer Neg Hx    Rectal cancer Neg Hx    Pancreatic cancer Neg Hx     Social History   Socioeconomic History   Marital status: Married    Spouse name: Butch Penny   Number of children: 2   Years of education: 14   Highest education level: Not on file  Occupational History   Not on file  Tobacco Use   Smoking status: Former Smoker    Years: 1.00    Types: Cigarettes    Quit date: 02/04/1967    Years since quitting: 53.0   Smokeless tobacco: Never Used   Tobacco comment: smoked 1 q2-3 days  Vaping Use   Vaping Use: Never used  Substance and Sexual Activity   Alcohol use: Yes    Comment: occasional beer   Drug use: No   Sexual activity: Yes  Other Topics Concern   Not on file  Social History Narrative   Patient is married Butch Penny).   Patient has two children.   Patient is retired.   Patient has a college education.   Patient is right-handed.  Patient drinks two cups of coffee per day and 2-3 cups of Diet soda daily and limited tea.         Social Determinants of Health   Financial Resource Strain:    Difficulty of Paying Living Expenses:   Food Insecurity:    Worried About Charity fundraiser in the Last Year:    Arboriculturist in the Last Year:   Transportation Needs:    Film/video editor (Medical):    Lack of Transportation (Non-Medical):   Physical Activity:    Days of Exercise per Week:    Minutes of Exercise per Session:   Stress:     Feeling of Stress :   Social Connections:    Frequency of Communication with Friends and Family:    Frequency of Social Gatherings with Friends and Family:    Attends Religious Services:    Active Member of Clubs or Organizations:    Attends Music therapist:    Marital Status:   Intimate Partner Violence:    Fear of Current or Ex-Partner:    Emotionally Abused:    Physically Abused:    Sexually Abused:     Review of Systems:    Constitutional: No weight loss, fever or chills Cardiovascular: No chest pain Respiratory: No SOB  Gastrointestinal: See HPI and otherwise negativey   Physical Exam:  Vital signs: BP (!) 128/58    Pulse (!) 56    Ht 5\' 6"  (1.676 m)    Wt 214 lb (97.1 kg)    BMI 34.54 kg/m   Constitutional:   Pleasant Caucasian male appears to be in NAD, Well developed, Well nourished, alert and cooperative Respiratory: Respirations even and unlabored. Lungs clear to auscultation bilaterally.   No wheezes, crackles, or rhonchi.  Cardiovascular: Normal S1, S2. No MRG. Regular rate and rhythm. No peripheral edema, cyanosis or pallor.  Gastrointestinal:  Soft, nondistended, nontender. No rebound or guarding. Normal bowel sounds. No appreciable masses or hepatomegaly. Rectal:  Not performed.  Psychiatric:  Demonstrates good judgement and reason without abnormal affect or behaviors.  RELEVANT LABS AND IMAGING: CBC    Component Value Date/Time   WBC 6.3 01/01/2020 1442   WBC 5.6 09/12/2019 0956   RBC 3.42 (L) 01/01/2020 1442   HGB 10.2 (L) 01/01/2020 1442   HCT 31.4 (L) 01/01/2020 1442   PLT 176 01/01/2020 1442   MCV 91.8 01/01/2020 1442   MCH 29.8 01/01/2020 1442   MCHC 32.5 01/01/2020 1442   RDW 14.0 01/01/2020 1442   LYMPHSABS 0.9 01/01/2020 1442   MONOABS 0.6 01/01/2020 1442   EOSABS 0.4 01/01/2020 1442   BASOSABS 0.1 01/01/2020 1442    CMP     Component Value Date/Time   NA 137 09/12/2019 0956   K 3.7 09/12/2019 0956   CL 106  09/12/2019 0956   CO2 22 09/12/2019 0956   GLUCOSE 134 (H) 09/12/2019 0956   BUN 20 09/12/2019 0956   CREATININE 1.38 (H) 09/12/2019 0956   CALCIUM 9.0 09/12/2019 0956   PROT 6.5 05/28/2018 1345   ALBUMIN 3.6 05/28/2018 1345   AST 18 05/28/2018 1345   ALT 23 05/28/2018 1345   ALKPHOS 47 05/28/2018 1345   BILITOT 0.4 05/28/2018 1345   GFRNONAA 49 (L) 09/12/2019 0956   GFRAA 57 (L) 09/12/2019 0956    Assessment: 1.  Iron deficiency anemia: Multifactorial, but no recent endoscopic procedures; consider GI blood loss which could be contributing 2.  Chronic anticoagulation for A. fib:  On Xarelto 3.  Status post TAVR  Plan: 1.  Patient is going to see his cardiologist later today, we will message him in regards to cardiac and Xarelto clearance.  Ideally would like to hold patient's Xarelto for 2 days prior to time of procedure.  We will ensure this is acceptable for him. 2.  Will discuss with Dr. Loletha Carrow.  I believe these procedures could be scheduled in the [facility] but will ensure that he is okay with this before scheduling.  Told the patient that we will call him to get these things arranged. 3.  Recommend the patient have an EGD and colonoscopy for further evaluation of iron deficiency anemia.  He also has occasional reflux.  Did discuss risks, benefits, limitations and alternatives and patient agrees to proceed.  He has had both of his Covid vaccines. 4.  For now patient can continue oral iron though there was some concern over this increasing constipation in the past and Feraheme may be a better option for him. 5.  Patient will await recommendations from our office.  Ellouise Newer, PA-C [facility] 02/20/2020, 9:36 AM  Cc: [name], MD

## 2020-02-20 NOTE — Telephone Encounter (Signed)
Dear Dr. [name]:   This patient is scheduled for endoscopy/colonoscopy in the near future, under Propofol anesthesia.  Please FAX a note of MEDICAL CLEARANCE to [phone] to ATTN:  Caryl Asp, [place].  Please advise if this patient will require an office visit or further medical work-up before clearance can be given.   Thank you,    Horris Latino, CMA

## 2020-02-20 NOTE — Progress Notes (Signed)
Primary Physician/Referring:  [name], MD  Patient ID: Ellison Carwin, male    DOB: 1940-10-12, 79 y.o.   MRN: [id number]  Chief Complaint  Patient presents with  . Coronary Artery Disease  . Atrial Fibrillation  . Aortic Stenosis  . Follow-up    6 month   HPI:    HPI: DIDIER BRANDENBURG  is a 79 y.o. Caucasian male h/o aortic valve replacement along with LIMA to D1 on 05/30/2018, invasive prostate cancer diagnosed in Nov 2018 and is S/P chemo and RT and in remission, anemia of chronic disease and also mild iron defeciency, hypertension, hyperlipidemia, obstructive sleep apnea on CPAP, controlled diabetes mellitus, mild obesity, mostly truncal obesity. 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  He presents here for 42-monthoffice visit, states that he has been scheduled for colonoscopy.  Has chronic dyspnea, no change in leg edema, he was started on torsemide which he is tolerating with improvement in leg edema.  No chest pain or palpitations.  Wife present.  Past Medical History:  Diagnosis Date  . Acute anterior wall MI ([place] 03/01/2018  . Acute combined systolic and diastolic heart failure ([place] 03/15/2018  . Bronchitis, acute 05/04/2012  . Burn   . Carotid stenosis, left followed by dr gEinar Gip cardiologist--- bilateral bruit per note   per dr gEinar Gipnote [id number] LICA 584-69%stenosis per duplex 05-14-2014  . Chronic diastolic CHF (congestive heart failure) ([place] 09/21/2018  . Defect, retina, with detachment   . Dementia ([facility]   . Diverticulosis   . DM (diabetes mellitus) type 2, uncontrolled, with ketoacidosis ([facility] 11/25/2013  . ED (erectile dysfunction)   . First degree heart block   . GERD (gastroesophageal reflux disease)   . Heart murmur   . History of blood transfusion    with open heart surgery  . Hypertension    cardiologsit-  dr  gEinar Gip . Hypertensive retinopathy    OU  . Iron deficiency anemia   . Mixed hyperlipidemia   . Morbid obesity ([facility] [place]   . Myocardial infarction ([facility]    02/28/18  . OA (osteoarthritis)    right hip  . OSA on CPAP    followed by dr dohmeier, uses CPAP  . PAF (paroxysmal atrial fibrillation) ([name])    a. on Eliquis  . Prostate cancer ([place]    dx 03-17-2017 (bx)  Stage T2a, Gleason 4+4, PSA  4.43, vol 32.32--  s/p radiatctive prostate seed implants 07-10-2017 then IMRT and ADT  . Retinal detachment    Rheg. RD OS  . RLS (restless legs syndrome)   . S/P aortic valve replacement with bioprosthetic valve 05/30/2018   23 mm Edwards Inspiris Resilia stented bovine pericardial tissue valve  . 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 S/P Maze operation for atrial fibrillation 05/30/2018   Complete bilateral atrial lesion set using bipolar radiofrequency and cryothermy ablation with clipping of LA appendage  . Scoliosis   . Severe aortic stenosis   . Trigger finger   . Type 2 diabetes mellitus ([place]   . Wears partial dentures    upper and lower   Past Surgical History:  Procedure Laterality Date  . AORTIC VALVE REPLACEMENT N/A 05/30/2018   Procedure: AORTIC VALVE REPLACEMENT (AVR) using Inspiris Valve, Size 23;  Surgeon: ORexene Alberts MD;  [place]  Service: Open Heart Surgery;  Laterality: N/A;  . APPENDECTOMY  1988  . BILATERAL TOTAL ETHMOIDECTOMY AND SPHENOIDECTOMY  01-14-2009    dr bates   [facility]  . CARDIOVERSION N/A 08/10/2018   Procedure: CARDIOVERSION;  Surgeon: Adrian Prows, MD;  Location: [place];  Service: Cardiovascular;  Laterality: N/A;  . CARPAL TUNNEL RELEASE Bilateral 2013  . CATARACT EXTRACTION Bilateral    Dr. [name]  . CATARACT EXTRACTION W/ INTRAOCULAR LENS  IMPLANT, BILATERAL  date?  . CORONARY ARTERY BYPASS GRAFT N/A 05/30/2018   Procedure: CORONARY ARTERY BYPASS GRAFTING (CABG) x one using left internal mammary artery;  Surgeon: [name], MD;  Location: [place];  Service: Open Heart Surgery;  Laterality: N/A;  . CORONARY BALLOON ANGIOPLASTY N/A 03/01/2018   Procedure: CORONARY BALLOON ANGIOPLASTY;  Surgeon: Adrian Prows, MD;  Location: [place] CV LAB;  Service: Cardiovascular;  Laterality: N/A;  . CORONARY/GRAFT ACUTE MI REVASCULARIZATION N/A 03/01/2018   Procedure: Coronary/Graft Acute MI Revascularization;  Surgeon: Adrian Prows, MD;  Location: [place] CV LAB;  Service: Cardiovascular;  Laterality: N/A;  . CYSTOSCOPY  07/19/2017   Procedure: CYSTOSCOPY;  Surgeon: Nickie Retort, MD;  Location: [facility];  Service: Urology;;  No seeds found in [place]  . EYE SURGERY Bilateral    Cat Sx OU.  Dislocated IOL sx OD.  Rheg. RD repair OS.  . EYE SURGERY    . GAS/FLUID EXCHANGE Right 06/13/2019   Procedure: GAS/FLUID EXCHANGE;  Surgeon: [name], MD;  Location: [place];  Service: Ophthalmology;  Laterality: Right;  . LAMINECTOMY WITH POSTERIOR LATERAL ARTHRODESIS LEVEL 2 N/A 02/12/2014   Procedure: LUMBAR TWO-THREE,LUIMBAR THREE-FOUR LAMINECTOMY/FORAMINOTOMY;POSSIBLE POSTEROLATERAL ARTHRODESIS WITH AUTOGRAFT;  Surgeon: [name], MD;  [facility];  Service: Neurosurgery;  Laterality: N/A;  . LEFT HEART CATH AND CORONARY ANGIOGRAPHY N/A 03/01/2018   Procedure: LEFT HEART CATH AND CORONARY ANGIOGRAPHY;  Surgeon: Adrian Prows, MD;  Location: [place] CV LAB;  Service: Cardiovascular;  Laterality: N/A;  . MAZE N/A 05/30/2018   Procedure: MAZE;  Surgeon: [name], MD;  Location: [place];  Service: Open Heart Surgery;  Laterality: N/A;  . ORIF RIGHT ANKLE FX'S  05/05/2001   retained hardware  . PARS PLANA VITRECTOMY Right 06/13/2019   Procedure: PARS PLANA VITRECTOMY WITH 25 GAUGE WITH LASER AND GAS;  Surgeon: [name], MD;  Location: [place];  Service: Ophthalmology;  Laterality: Right;  . PARS PLANA VITRECTOMY Right 09/12/2019   Procedure: [place] Vitrectomy With 25 Gauge;   Surgeon: [name], MD;  Location: [place];  Service: Ophthalmology;  Laterality: Right;  . [place] INJECTION Right 06/13/2019   Procedure: PERFLUORONE INJECTION;  Surgeon: [name], MD;  Location: [place];  Service: Ophthalmology;  Laterality: Right;  . PHOTOCOAGULATION WITH LASER Right 06/13/2019   Procedure: PHOTOCOAGULATION WITH LASER;  Surgeon: [name], MD;  Location: [place];  Service: Ophthalmology;  Laterality: Right;  . PLACEMENT AND SUTURE OF SECONDARY INTRAOCULAR LENS Right 09/12/2019   Procedure: PLACEMENT AND SUTURE OF SECONDARY INTRAOCULAR LENS;  Surgeon: [name], MD;  Location: [place];  Service: Ophthalmology;  Laterality: Right;  . PROSTATE BIOPSY  03-17-2017   dr Pilar Jarvis office  . RADIOACTIVE SEED IMPLANT N/A 07/19/2017   Procedure: RADIOACTIVE SEED IMPLANT/BRACHYTHERAPY IMPLANT;  Surgeon: Nickie Retort, MD;  Location[facility];  Service: Urology;  Laterality: N/A;  69 seeds implanted  . REMOVAL RETAINED LENS Right 09/12/2019   Procedure: REMOVAL RETAINED LENS;  Surgeon: [name], MD;  Location: [place];  Service: Ophthalmology;  Laterality: Right;  . RETINAL DETACHMENT SURGERY Right 06/13/2019   Dr. Coralyn Pear  . SKIN GRAFT     face  . SPACE OAR INSTILLATION N/A 07/19/2017   Procedure: SPACE OAR INSTILLATION;  Surgeon: Nickie Retort, MD;  Location: [facility];  Service: Urology;  Laterality: N/A;  . TEE WITHOUT CARDIOVERSION  03/20/2012   Procedure: TRANSESOPHAGEAL ECHOCARDIOGRAM (TEE);  Surgeon: Laverda Page, MD;  Location: [facility];  Service: Cardiovascular;  Laterality: N/A;  normal LV; normal EF; normal RV; normal LA w/ left atrial appendage very small, normal function, interatrial septum intact without defect; normal RA; trace MR,TR, & PI; mild AV calcification and senile degeneration w/ mild stenosis, AVA 1.7cm^2;;   . TEE WITHOUT CARDIOVERSION N/A 05/30/2018   Procedure: TRANSESOPHAGEAL ECHOCARDIOGRAM  (TEE);  Surgeon: [name], MD;  Location: [place];  Service: Open Heart Surgery;  Laterality: N/A;  . TOTAL HIP ARTHROPLASTY Right 10/23/2017   Procedure: TOTAL HIP ARTHROPLASTY ANTERIOR APPROACH;  Surgeon: Frederik Pear, MD;  Location: [place];  Service: Orthopedics;  Laterality: Right;  . TRANSTHORACIC ECHOCARDIOGRAM  01-11-2017   dr [name]  (per echo note, no significant change in seveity of AS, no other diagnostic change)   moderate concentric LVH, ef 66%, grade 1 diastolic dysfunction/  moderate LAE/  mild , grade 1 AR w/ moderate AV calcification, mild to moderate restricted AV leaflets w/ moderate AS, AVA 1.16cm^2, peak grandient 51mHg, mean grandient 323mg/  trace MR, mild calcification MV annulus , mild MV leaflet calcification, mild MVS, peak grandient 4.58m69m, mean grandient 2.7mm21m trace TR   Social History   Tobacco Use  . Smoking status: Former Smoker    Packs/day: 0.25    Years: 1.00    Pack years: 0.25    Types: Cigarettes    Quit date: 02/04/1967    Years since quitting: 53.0  . Smokeless tobacco: Never Used  . Tobacco comment: smoked 1 q2-3 days  Substance Use Topics  . Alcohol use: Yes    Comment: occasional beer   Marital Status: Married   ROS  Review of Systems  Cardiovascular: Positive for dyspnea on exertion and leg swelling. Negative for chest pain.  Musculoskeletal: Positive for arthritis and back pain.  Gastrointestinal: Negative for melena.  Neurological: Positive for disturbances in coordination and dizziness.   Objective  Blood pressure (!) 140/75, pulse 55, resp. rate 16, height _0  (1.676 m), weight (!) 215 lb 12.8 oz (97.9 kg), SpO2 98 %. Body mass index is 34.83 kg/m.   Vitals with BMI 02/20/2020 02/20/2020 01/01/2020  Height _1  _2  _3   Weight 215 lbs 13 oz 214 lbs 218 lbs 2 oz  BMI 34.85 [id number] 140 595 638 756astolic 75 58 61  Pulse 55 56 53    Physical Exam Constitutional:      General: He is not in acute  distress.    Appearance: He is well-developed.  Neck:     Thyroid: No thyromegaly.     Vascular: No JVD.  Cardiovascular:     Rate and Rhythm: Normal rate and regular rhythm.     Pulses: Intact distal pulses.          Carotid pulses are 2+ on the right side with bruit and 2+ on the left side with bruit.      Popliteal pulses are 2+ on the right side and 2+ on the  left side.       Dorsalis pedis pulses are 2+ on the right side and 1+ on the left side.       Posterior tibial pulses are 0 on the right side and 0 on the left side.     Heart sounds: Murmur heard.  Early systolic murmur is present with a grade of 2/6 at the upper right sternal border radiating to the apex.  No gallop.      Comments: Leg edema pitting 2+ bilateral below knee.  Pulmonary:     Effort: Pulmonary effort is normal.     Breath sounds: Normal breath sounds.  Abdominal:     General: Bowel sounds are normal.     Palpations: Abdomen is soft.  Musculoskeletal:        General: Normal range of motion.    Radiology: No results found.  Laboratory examination:   CMP Latest Ref Rng & Units 09/12/2019 06/13/2019 06/02/2018  Glucose 70 - 99 mg/dL 134(H) 85 109(H)  BUN 8 - 23 mg/dL _0 Creatinine 0.61 - 1.24 mg/dL 1.38(H) 1.10 0.90  Sodium 135 - 145 mmol/L 137 138 136  Potassium 3.5 - 5.1 mmol/L 3.7 4.0 4.0  Chloride 98 - 111 mmol/L 106 108 110  CO2 22 - 32 mmol/L 22 21(L) 21(L)  Calcium 8.9 - 10.3 mg/dL 9.0 9.3 8.0(L)  Total Protein 6.5 - 8.1 g/dL - - -  Total Bilirubin 0.3 - 1.2 mg/dL - - -  Alkaline Phos 38 - 126 U/L - - -  AST 15 - 41 U/L - - -  ALT 0 - 44 U/L - - -   CBC Latest Ref Rng & Units 01/01/2020 09/12/2019 08/29/2019  WBC 4.0 - 10.5 K/uL 6.3 5.6 6.1  Hemoglobin 13.0 - 17.0 g/dL 10.2(L) 10.6(L) 11.0(L)  Hematocrit 39 - 52 % 31.4(L) 33.0(L) 33.0(L)  Platelets 150 - 400 K/uL 176 170 173   Lipid Panel     Component Value Date/Time   CHOL 151 03/01/2018 0514   TRIG 69 03/01/2018 0514   HDL 55  03/01/2018 0514   CHOLHDL 2.7 03/01/2018 0514   VLDL 14 03/01/2018 0514   LDLCALC 82 03/01/2018 0514   HEMOGLOBIN A1C Lab Results  Component Value Date   HGBA1C 5.8 (H) 05/28/2018   MPG 119.76 05/28/2018   External labs:  Cholesterol, total 143.000 m 01/07/2020 HDL 45 MG/DL 01/07/2020 LDL 70.000 mg 01/07/2020 Triglycerides 142.000 01/07/2020  A1C 6.000 % 01/07/2020 TSH 1.000 01/07/2020  Hemoglobin 10.000 g/d 01/07/2020 Platelets 176.000 01/01/2020   Creatinine, Serum 1.400 mg/ 01/07/2020 Potassium 3.700 09/12/2019 Magnesium N/D ALT (SGPT) 18.000 uni 01/07/2020  Labs 01/02/2019: A1c 5.8%.  Serum glucose 98 mg, BUN 16, creatinine 1.0, eGFR 72 mL, potassium 3.6, CMP otherwise normal.   HB 8.3/HCT 25.9, platelets 182, normal indicis.   Total cholesterol 98, triglycerides 57, HDL 42, LDL 45.  TSH normal.  Medications   Current Outpatient Medications  Medication Instructions  . atorvastatin (LIPITOR) 80 MG tablet TAKE 1 TABLET BY MOUTH  DAILY  . colesevelam (WELCHOL) 625 mg, Oral, 3 times daily  . donepezil (ARICEPT) 10 mg, Oral,  Every morning - 10a  . dorzolamide-timolol (COSOPT) 22.3-6.8 MG/ML ophthalmic solution 1 drop, Right Eye, 2 times daily  . esomeprazole (NEXIUM) 40 mg, Oral, Daily  . ferrous sulfate 325 mg, Oral, Daily with breakfast  . insulin glargine (LANTUS SOLOSTAR) 20-40 Units, Subcutaneous, At bedtime PRN  . memantine (NAMENDA) 5 mg, Oral, 2 times daily  .  metoprolol tartrate (LOPRESSOR) 25 mg, Oral, Daily  . mirabegron ER (MYRBETRIQ) 50 mg, Oral, Daily at bedtime  . omega-3 acid ethyl esters (LOVAZA) 2 g, Oral, Daily  . prednisoLONE acetate (PRED FORTE) 1 % ophthalmic suspension 1 drop, Right Eye, Every 2 hours while awake  . rOPINIRole (REQUIP) 2 mg, Oral, 3 times daily PRN  . spironolactone (ALDACTONE) 12.5 mg, Oral, Daily  . tamsulosin (FLOMAX) 0.4 mg, Oral, Daily after supper  . torsemide (DEMADEX) 20 mg, Oral, 2 times daily PRN  . valsartan-hydrochlorothiazide  (DIOVAN-HCT) 80-12.5 MG tablet TAKE 1 TABLET BY MOUTH IN  THE MORNING  . vitamin B-12 (CYANOCOBALAMIN) 1,000 mcg, Oral, 2 times daily  . XARELTO 15 MG TABS tablet TAKE 1 TABLET BY MOUTH  EVERY EVENING AFTER DINNER   Cardiac Studies:   Echocardiogram 07/11/2018: Left ventricle cavity is normal in size. Abnormal septal wall motion due to post-operative coronary artery bypass graft. Diastolic function assessment limited due to mitral annular calcification and post op status. Calculated EF 65%. Left atrial cavity is mildly dilated. Well seated bioprosthetic aortic valve with normal functioning. Mean PG 8 mmHg likely normal for bioprosthetic valve. Moderate calcification of the mitral valve annulus and leaflets. No significant stenosis. Moderate (Grade II) mitral regurgitation. Compared to previous study on 08/24/2017, bioprosthetic aortic valve is new.   Coronary angiogram 03/20/2018: Left main mildly calcified but normal, LAD large caliber vessel, mild to moderate diffuse disease followed by occlusion in the mid to distal segment, distally the LAD appears to be diffusely diseased with TIMI I flow.  Aborted PTCA. D-2 is moderate to  large sized with tandem 80 to 90% stenosis. Mild disease in other vessels. Severe aortic stenosis with peak to peak gradient of 69 and mean gradient of 53 mmHg.  Severely calcified mitral annulus and moderately calcified aortic valve.  EKG:  EKG 02/20/2020: Atypical atrial flutter versus atrial tachycardia with 3: 1 conduction, ventricular rate 52 bpm.  Inferior infarct old, anterolateral infarct old.  IVCD, borderline criteria for LVH.  Nonspecific T abnormality. No significant change from 12/08/2019.  Assessment     ICD-10-CM   1. Coronary artery disease of native artery of native heart with stable angina pectoris ([place])  I25.118 EKG 12-Lead  2. Chronic diastolic CHF (congestive heart failure) ([name])  I50.32 torsemide (DEMADEX) 20 MG tablet  3. S/P aortic valve replacement  with bioprosthetic valve  Z95.3   4. Paroxysmal atrial fibrillation ([place]).   I48.0   5. Primary hypertension  I10     CHA2DS2-VASc Score is 5.  Yearly risk of stroke: 7.2% (A, HTN, DM, Vasc Dz).  Score of 1=0.6; 2=2.2; 3=3.2; 4=4.8; 5=7.2; 6=9.8; 7=>9.8) -(CHF; HTN; vasc disease DM,  Male = 1; Age <65 =0; 65-74 = 1,  >75 =2; stroke/embolism= 2).    Recommendations:   DONNEY CARAVEO  is a 79 y.o. Caucasian male h/o aortic valve replacement along with LIMA to D1 on 05/30/2018, invasive prostate cancer diagnosed in Nov 2018 and is S/P chemo and RT and in remission, anemia of chronic disease and also mild iron defeciency, hypertension, hyperlipidemia, obstructive sleep apnea on CPAP, controlled diabetes mellitus, mild obesity, mostly truncal obesity. 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  He is now scheduled for colonoscopy due to heme positive stool.  I do not see any contraindication from cardiac standpoint.  Is tolerating anticoagulation, can certainly  hold it for colonoscopy.  I will discontinue amiodarone as patient appears to be in persistent atypical atrial flutter.   He has not had any angina pectoris, physical examination is unchanged with regard to aortic stenotic murmur/sclerotic murmur.  He does have chronic bilateral leg edema right worse than the left, probably related to his weight and also lack of mobility and chronic diastolic heart failure, continue torsemide but advised him to take it only on a daily basis instead of twice daily dose and also skip doses if leg edema is resolved.  Weight loss discussed with the patient.  I will see him back in 6 months.   Adrian Prows, MD, [facility] 02/20/2020, 2:38 PM Office: [phone]

## 2020-02-20 NOTE — Patient Instructions (Signed)
If you are age 77 or older, your body mass index should be between 23-30. Your Body mass index is 34.54 kg/m. If this is out of the aforementioned range listed, please consider follow up with your Primary Care Provider.  If you are age 97 or younger, your body mass index should be between 19-25. Your Body mass index is 34.54 kg/m. If this is out of the aformentioned range listed, please consider follow up with your Primary Care Provider.   Will call to schedule after receiving clearance from Dr. [name].

## 2020-02-21 ENCOUNTER — Telehealth: Payer: Self-pay | Admitting: *Deleted

## 2020-02-21 NOTE — Telephone Encounter (Signed)
-----   Message from Levin Erp, [place] sent at 02/21/2020  9:25 AM EDT ----- Regarding: RE: ok in [facility], lets get him scheduled :) ----- Message ----- From: Horris Latino, CMA Sent: 02/20/2020   4:01 PM EDT To: Levin Erp, PA Subject: RE: ok in [place]                                  Patient was seen by cardiology today and was given cardiac clearance and anti-coag clearance. ----- Message ----- From: Levin Erp, PA Sent: 02/20/2020   3:53 PM EDT To: Horris Latino, CMA Subject: FW: ok in [place]                                  Dr. Loletha Carrow says ok for [facility]- pending any craziness from cardiologist. Adella Hare ----- Message ----- From: Doran Stabler, MD Sent: 02/20/2020   2:26 PM EDT To: Levin Erp, PA Subject: RE: ok in [place]                                  Thanks for the note. I reviewed things and agree he is OK for the [place].  - H ----- Message ----- From: Levin Erp, PA Sent: 02/20/2020   9:44 AM EDT To: Nelida Meuse III, MD Subject: ok in [place]                                      Can you review my note-I think he is okay to be done in the [facility] now that he is status post aortic valve replacement, I was able to see the most recent echo in your last consult note which looks okay.  Just wanted to get your opinion.  We are also asking for cardiac and anticoagulant clearance today from his cardiologist.  Thanks, JLL

## 2020-02-21 NOTE — Telephone Encounter (Signed)
Left message for patient to call office to schedule colonoscopy in the [place].

## 2020-02-21 NOTE — Telephone Encounter (Signed)
Patient can be taken up for surgery/endoscopy with low risk. Discontinue Eliquis/Xarelto 2 days prior to procedure and if no biopsy, restart same day, otherwise in 2-3 days post procedure. I saw him yesterday.   Adrian Prows, MD, [facility] 02/21/2020, 7:16 AM Office: [phone]

## 2020-02-21 NOTE — Progress Notes (Signed)
____________________________________________________________  Attending physician addendum:  Thank you for sending this case to me. I have reviewed the entire note, and the outlined plan seems appropriate.  I reviewed your note as well with other elements of his chart and history, I agree with the plan and agree he can have that done in our office-based endoscopy department.  Wilfrid Lund, MD  ____________________________________________________________

## 2020-03-04 NOTE — Telephone Encounter (Signed)
Anderson Malta - your office note indicated you were recommending EGD and Colon but prior note says colonoscopy.  Just want to confirm before reaching out to the patient to schedule in the [place]. Thank you.

## 2020-03-04 NOTE — Telephone Encounter (Signed)
Called and scheduled ECL with pt's wife for 9-30.  Scheduled Previsit for 9-24.  He has been vaccinated and has been cleared to hold Xarelto for 2 days.

## 2020-03-04 NOTE — Telephone Encounter (Signed)
It is for egd and colon for [id number] thanks-JLL

## 2020-03-15 ENCOUNTER — Other Ambulatory Visit: Payer: Self-pay | Admitting: Cardiology

## 2020-03-16 NOTE — Progress Notes (Signed)
[facility] Note  03/18/2020     CHIEF COMPLAINT Patient presents for Retina Follow Up   HISTORY OF PRESENT ILLNESS: Cody Phelps is a 79 y.o. male who presents to the clinic today for:   HPI    Retina Follow Up    Patient presents with  Other.  In right eye.  This started weeks ago.  Severity is moderate.  Duration of weeks.  Since onset it is gradually improving.  I, the attending physician,  performed the HPI with the patient and updated documentation appropriately.          Comments    Pt states vision is gradually improving OD.  Patient denies eye pain or discomfort and denies any new or worsening floaters or fol OU.       Last edited by [name], MD on 03/18/2020  3:06 PM. (History)    No issues after last injection. Pt has a "stye" LLL states wife.  Advised to use hot compress.      Referring physician: [name], MD [place],  [place] [address]  HISTORICAL INFORMATION:   Selected notes from the [place] Referred by Dr. [name] for concern of RD OD   CURRENT MEDICATIONS: Current Outpatient Medications (Ophthalmic Drugs)  Medication Sig  . dorzolamide-timolol (COSOPT) 22.3-6.8 MG/ML ophthalmic solution Place 1 drop into the right eye 2 (two) times daily.  . prednisoLONE acetate (PRED FORTE) 1 % ophthalmic suspension Place 1 drop into the right eye every 2 (two) hours while awake.   No current facility-administered medications for this visit. (Ophthalmic Drugs)   Current Outpatient Medications (Other)  Medication Sig  . atorvastatin (LIPITOR) 80 MG tablet TAKE 1 TABLET BY MOUTH  DAILY  . colesevelam (WELCHOL) 625 MG tablet Take 625 mg by mouth 3 (three) times daily.   Marland Kitchen donepezil (ARICEPT) 10 MG tablet Take 10 mg by mouth every morning.   Marland Kitchen esomeprazole (NEXIUM) 40 MG capsule Take 40 mg by mouth daily.   . ferrous sulfate 325 (65 FE) MG tablet Take 1 tablet (325 mg total) by mouth daily with  breakfast.  . insulin glargine (LANTUS SOLOSTAR) 100 UNIT/ML injection Inject 20-40 Units into the skin at bedtime as needed (BGL below 140-150 take 20 units, if 151 or higher take 40 units).   . memantine (NAMENDA) 5 MG tablet Take 5 mg by mouth 2 (two) times daily.   . metoprolol tartrate (LOPRESSOR) 25 MG tablet TAKE 1 TABLET BY MOUTH  DAILY  . mirabegron ER (MYRBETRIQ) 50 MG TB24 tablet Take 50 mg by mouth at bedtime.   Marland Kitchen omega-3 acid ethyl esters (LOVAZA) 1 G capsule Take 2 g by mouth daily.   Marland Kitchen rOPINIRole (REQUIP) 2 MG tablet Take 2 mg by mouth 3 (three) times daily as needed (RLS).   Marland Kitchen spironolactone (ALDACTONE) 25 MG tablet Take 0.5 tablets (12.5 mg total) by mouth daily.  . tamsulosin (FLOMAX) 0.4 MG CAPS capsule Take 0.4 mg by mouth daily after supper.   . torsemide (DEMADEX) 20 MG tablet Take 1 tablet (20 mg total) by mouth 2 (two) times daily as needed (Leg swelling).  . valsartan-hydrochlorothiazide (DIOVAN-HCT) 80-12.5 MG tablet TAKE 1 TABLET BY MOUTH IN  THE MORNING (Patient taking differently: Take 1 tablet by mouth every morning. )  . vitamin B-12 (CYANOCOBALAMIN) 1000 MCG tablet Take 1,000 mcg by mouth 2 (two) times daily.  Alveda Reasons 15 MG TABS tablet TAKE 1 TABLET  BY MOUTH IN  THE EVENING AFTER DINNER   No current facility-administered medications for this visit. (Other)      REVIEW OF SYSTEMS: ROS    Positive for: Musculoskeletal, Endocrine, Cardiovascular, Eyes, Respiratory   Negative for: Constitutional, Gastrointestinal, Neurological, Skin, Genitourinary, HENT, Psychiatric, Allergic/Imm, Heme/Lymph   Last edited by Doneen Poisson on 03/18/2020  2:14 PM. (History)       ALLERGIES No Known Allergies  PAST MEDICAL HISTORY Past Medical History:  Diagnosis Date  . Acute anterior wall MI ([place]) 03/01/2018  . Acute combined systolic and diastolic heart failure ([place]) 03/15/2018  . Bronchitis, acute 05/04/2012  . Burn   . Carotid stenosis, left followed by dr [name],  cardiologist--- bilateral bruit per note   per dr [name] note 69/6789  LICA 38-10% stenosis per duplex 05-14-2014  . Chronic diastolic CHF (congestive heart failure) ([place]) 09/21/2018  . Defect, retina, with detachment   . Dementia ([place])   . Diverticulosis   . DM (diabetes mellitus) type 2, uncontrolled, with ketoacidosis ([place]) 11/25/2013  . ED (erectile dysfunction)   . First degree heart block   . GERD (gastroesophageal reflux disease)   . Heart murmur   . History of blood transfusion    with open heart surgery  . Hypertension    cardiologsit-  dr [name]  . Hypertensive retinopathy    OU  . Iron deficiency anemia   . Mixed hyperlipidemia   . Morbid obesity ([place])   . Myocardial infarction ([place])    02/28/18  . OA (osteoarthritis)    right hip  . OSA on CPAP    followed by dr dohmeier, uses CPAP  . PAF (paroxysmal atrial fibrillation) ([name])    a. on Eliquis  . Prostate cancer ([place])    dx 03-17-2017 (bx)  Stage T2a, Gleason 4+4, PSA  4.43, vol 32.32--  s/p radiatctive prostate seed implants 07-10-2017 then IMRT and ADT  . Retinal detachment    Rheg. RD OS  . RLS (restless legs syndrome)   . S/P aortic valve replacement with bioprosthetic valve 05/30/2018   23 mm Edwards Inspiris Resilia stented bovine pericardial tissue valve  . 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 S/P Maze operation for atrial fibrillation 05/30/2018   Complete bilateral atrial lesion set using bipolar radiofrequency and cryothermy ablation with clipping of LA appendage  . Scoliosis   . Severe aortic stenosis   . Trigger finger   . Type 2 diabetes mellitus ([place])   . Wears partial dentures    upper and lower   Past Surgical History:  Procedure Laterality Date  . AORTIC VALVE REPLACEMENT N/A 05/30/2018   Procedure: AORTIC VALVE REPLACEMENT (AVR) using Inspiris Valve, Size 23;  Surgeon: [name], MD;  Location: [facility];  Service: Open Heart Surgery;  Laterality: N/A;  . APPENDECTOMY  1988   . BILATERAL TOTAL ETHMOIDECTOMY AND SPHENOIDECTOMY  01-14-2009    dr bates   [facility]  . CARDIOVERSION N/A 08/10/2018   Procedure: CARDIOVERSION;  Surgeon: Adrian Prows, MD;  Location: [facility];  Service: Cardiovascular;  Laterality: N/A;  . CARPAL TUNNEL RELEASE Bilateral 2013  . CATARACT EXTRACTION Bilateral    Dr. [name]  . CATARACT EXTRACTION W/ INTRAOCULAR LENS  IMPLANT, BILATERAL  date?  . CORONARY ARTERY BYPASS GRAFT N/A 05/30/2018   Procedure: CORONARY ARTERY BYPASS GRAFTING (CABG) x one using left internal mammary artery;  Surgeon: [name], MD;  Location: [facility];  Service: Open  Heart Surgery;  Laterality: N/A;  . CORONARY BALLOON ANGIOPLASTY N/A 03/01/2018   Procedure: CORONARY BALLOON ANGIOPLASTY;  Surgeon: Adrian Prows, MD;  Location: [facility];  Service: Cardiovascular;  Laterality: N/A;  . CORONARY/GRAFT ACUTE MI REVASCULARIZATION N/A 03/01/2018   Procedure: Coronary/Graft Acute MI Revascularization;  Surgeon: Adrian Prows, MD;  Location: [facility];  Service: Cardiovascular;  Laterality: N/A;  . CYSTOSCOPY  07/19/2017   Procedure: CYSTOSCOPY;  Surgeon: Nickie Retort, MD;  Location: [facility];  Service: Urology;;  No seeds found in [place]  . EYE SURGERY Bilateral    Cat Sx OU.  Dislocated IOL sx OD.  Rheg. RD repair OS.  . EYE SURGERY    . GAS/FLUID EXCHANGE Right 06/13/2019   Procedure: GAS/FLUID EXCHANGE;  Surgeon: [name], MD;  Location: [facility];  Service: Ophthalmology;  Laterality: Right;  . LAMINECTOMY WITH POSTERIOR LATERAL ARTHRODESIS LEVEL 2 N/A 02/12/2014   Procedure: LUMBAR TWO-THREE,LUIMBAR THREE-FOUR LAMINECTOMY/FORAMINOTOMY;POSSIBLE POSTEROLATERAL ARTHRODESIS WITH AUTOGRAFT;  Surgeon: [name], MD;  Location: [facility];  Service: Neurosurgery;  Laterality: N/A;  . LEFT HEART CATH AND CORONARY ANGIOGRAPHY N/A 03/01/2018   Procedure: LEFT HEART CATH AND CORONARY ANGIOGRAPHY;  Surgeon: Adrian Prows, MD;  Location: [facility];  Service: Cardiovascular;  Laterality: N/A;  . MAZE N/A 05/30/2018   Procedure: MAZE;  Surgeon: [name], MD;  Location: [facility];  Service: Open Heart Surgery;  Laterality: N/A;  . ORIF RIGHT ANKLE FX'S  05/05/2001   retained hardware  . PARS PLANA VITRECTOMY Right 06/13/2019   Procedure: PARS PLANA VITRECTOMY WITH 25 GAUGE WITH LASER AND GAS;  Surgeon: [name], MD;  Location: [facility];  Service: Ophthalmology;  Laterality: Right;  . PARS PLANA VITRECTOMY Right 09/12/2019   Procedure: [place] Vitrectomy With 25 Gauge;  Surgeon: [name], MD;  Location: [facility];  Service: Ophthalmology;  Laterality: Right;  . [place] INJECTION Right 06/13/2019   Procedure: PERFLUORONE INJECTION;  Surgeon: [name], MD;  Location: [facility];  Service: Ophthalmology;  Laterality: Right;  . PHOTOCOAGULATION WITH LASER Right 06/13/2019   Procedure: PHOTOCOAGULATION WITH LASER;  Surgeon: [name], MD;  Location: [facility];  Service: Ophthalmology;  Laterality: Right;  . PLACEMENT AND SUTURE OF SECONDARY INTRAOCULAR LENS Right 09/12/2019   Procedure: PLACEMENT AND SUTURE OF SECONDARY INTRAOCULAR LENS;  Surgeon: [name], MD;  Location: [facility];  Service: Ophthalmology;  Laterality: Right;  . PROSTATE BIOPSY  03-17-2017   dr Pilar Jarvis office  . RADIOACTIVE SEED IMPLANT N/A 07/19/2017   Procedure: RADIOACTIVE SEED IMPLANT/BRACHYTHERAPY IMPLANT;  Surgeon: Nickie Retort, MD;  Location: [facility];  Service: Urology;  Laterality: N/A;  69 seeds implanted  . REMOVAL RETAINED LENS Right 09/12/2019   Procedure: REMOVAL RETAINED LENS;  Surgeon: [name], MD;  Location: [facility];  Service: Ophthalmology;  Laterality: Right;  . RETINAL DETACHMENT SURGERY Right 06/13/2019   Dr. Coralyn Pear  . SKIN GRAFT     face  . SPACE OAR INSTILLATION N/A 07/19/2017   Procedure: SPACE OAR INSTILLATION;  Surgeon: Nickie Retort, MD;  Location: [facility];  Service:  Urology;  Laterality: N/A;  . TEE WITHOUT CARDIOVERSION  03/20/2012   Procedure: TRANSESOPHAGEAL ECHOCARDIOGRAM (TEE);  Surgeon: Laverda Page, MD;  Location: [facility];  Service: Cardiovascular;  Laterality: N/A;  normal LV; normal EF; normal RV; normal LA w/ left atrial appendage very small, normal function, interatrial septum intact without defect; normal  RA; trace MR,TR, & PI; mild AV calcification and senile degeneration w/ mild stenosis, AVA 1.7cm^2;;   . TEE WITHOUT CARDIOVERSION N/A 05/30/2018   Procedure: TRANSESOPHAGEAL ECHOCARDIOGRAM (TEE);  Surgeon: [name], MD;  Location: [facility];  Service: Open Heart Surgery;  Laterality: N/A;  . TOTAL HIP ARTHROPLASTY Right 10/23/2017   Procedure: TOTAL HIP ARTHROPLASTY ANTERIOR APPROACH;  Surgeon: Frederik Pear, MD;  Location: [facility];  Service: Orthopedics;  Laterality: Right;  . TRANSTHORACIC ECHOCARDIOGRAM  01-11-2017   dr [name]  (per echo note, no significant change in seveity of AS, no other diagnostic change)   moderate concentric LVH, ef 22%, grade 1 diastolic dysfunction/  moderate LAE/  mild , grade 1 AR w/ moderate AV calcification, mild to moderate restricted AV leaflets w/ moderate AS, AVA 1.16cm^2, peak grandient 49mHg, mean grandient 344mg/  trace MR, mild calcification MV annulus , mild MV leaflet calcification, mild MVS, peak grandient 4.35m21m, mean grandient 2.7mm28m trace TR    FAMILY HISTORY Family History  Problem Relation Age of Onset  . Stroke Mother   . Hypertension Mother   . Heart attack Father   . Heart murmur Father   . Hypertension Sister   . Lung disease Brother   . Colon cancer Neg Hx   . Stomach cancer Neg Hx   . Rectal cancer Neg Hx   . Pancreatic cancer Neg Hx     SOCIAL HISTORY Social History   Tobacco Use  . Smoking status: Former Smoker    Packs/day: 0.25    Years: 1.00    Pack years: 0.25    Types: Cigarettes    Quit date: 02/04/1967    Years since quitting: 53.1  . Smokeless tobacco:  Never Used  . Tobacco comment: smoked 1 q2-3 days  Vaping Use  . Vaping Use: Never used  Substance Use Topics  . Alcohol use: Yes    Comment: occasional beer  . Drug use: No         OPHTHALMIC EXAM:  Base Eye Exam    Visual Acuity (Snellen - Linear)      Right Left   Dist [place] 20/80 -2 20/25 -1   Dist ph [place] 20/30 -1 20/25 +1       Tonometry (Tonopen, 2:22 PM)      Right Left   Pressure 11 14       Pupils      Dark Light Shape React APD   Right 3 2 Round Brisk 0   Left 3 2 Round Brisk 0       Visual Fields      Left Right    Full Full       Extraocular Movement      Right Left    Full Full       Neuro/Psych    Oriented x3: Yes   Mood/Affect: Normal       Dilation    Both eyes: 1.0% Mydriacyl, 2.5% Phenylephrine @ 2:22 PM        Slit Lamp and Fundus Exam    External Exam      Right Left   External Periorbital edema        Slit Lamp Exam      Right Left   Lids/Lashes mild Meibomian gland dysfunction, mild Telangiectasia Dermatochalasis - upper lid, Meibomian gland dysfunction, Edema, Erythema lower lid, positive concretion left palp conj   Conjunctiva/Sclera White and quiet; residual STK in ST quadrant Temporal Pinguecula   Cornea well  healed superior corneal wound, sutures gone; 1-2+Punctate epithelial erosions, Arcus, mild tear film debris 1+ Punctate epithelial erosions, +verticellata, trace endo pigment, Debris in tear film   Anterior Chamber Deep, clear Deep and quiet   Iris Round and dilated to 29m, scattered Transillumination defects 360, +iridodenesis Round and dilated   Lens Sutured Akreos IOL well centered  3 piece Posterior chamber intraocular in good position, trace Posterior capsular opacification   Vitreous post vitrectomy Vitreous syneresis, Posterior vitreous detachment       Fundus Exam      Right Left   Disc 1+Pallor, Sharp rim Mild Pallor, Sharp rim, temporal PPP, mild tilt   C/D Ratio 0.3 0.5   Macula Blunted foveal reflex, mild  RPE mottling and clumping, interval resolution of central cystic changes --  scattered MA Flat, Blunted foveal reflex, Retinal pigment epithelial mottling and clumping, No heme or edema   Vessels Mild Vascular attenuation, mild Tortuousity, mild A/V crossing changes Vascular attenuation, mild Tortuous   Periphery attached, good 360 laser in place; ORIGINALLY: bullous superior retinal detachment from 1000-0130, another more shallow detachment lobe from 130-400, lattice and micro tears ST quadrant; Old retinal tear at 0900 with surrounding laser, Attached, peripheral laser scars at 1030           IMAGING AND PROCEDURES  Imaging and Procedures for _0 @  OCT, Retina - OU - Both Eyes       Right Eye Quality was good. Central Foveal Thickness: 269. Progression has improved. Findings include normal foveal contour, no SRF, no IRF (Interval resolution of IRF/CME Nasal fovea).   Left Eye Quality was good. Central Foveal Thickness: 268. Progression has been stable. Findings include normal foveal contour, no IRF, no SRF.   Notes *Images captured and stored on drive  Diagnosis / Impression:  OD: Interval resolution of IRF/CME Nasal fovea OS: NFP, no IRF/SRF   Clinical management:  See below  Abbreviations: NFP - Normal foveal profile. CME - cystoid macular edema. PED - pigment epithelial detachment. IRF - intraretinal fluid. SRF - subretinal fluid. EZ - ellipsoid zone. ERM - epiretinal membrane. ORA - outer retinal atrophy. ORT - outer retinal tubulation. SRHM - subretinal hyper-reflective material                 ASSESSMENT/PLAN:    ICD-10-CM   1. Dislocation of intraocular lens, sequela  T85.22XS   2. Aphakia of right eye  H27.01   3. Right retinal detachment  H33.21   4. 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 Retinal edema  H35.81 OCT, Retina - OU - Both Eyes  6. History of repair of retinal tear by laser photocoagulation  Z98.890   7. Diabetes mellitus type 2  without retinopathy ([place]  E11.9   8. Essential hypertension  I10   9. Hypertensive retinopathy of both eyes  H35.033   10. Pseudophakia of both eyes  Z96.1   11. Cystoid macular edema of right eye  H35.351    1,2. Dislocated IOL OD  - pt with history of partially dislocated 3-piece IOL -- spontaneously dislocated completely into vitreous cavity on 1.23.21  - s/p 25g PPV w/ IOL explantation and secondary sutured IOL OD -- Akreos AO60 lens, 17.5D -- 02.11.2021             - IOL in good position  - 2 superior nylon sutures removed at slit lamp 03.12.21 -- last nylon suture removed at slit lamp, 04.30.21  - corneal edema resolved; no  epi defect; mild PEE  - BCVA stable at 20/30+2  - OCT shows interval improvement in IRF/CME nasal macula             - s/p IVK OD #1 (07.09.21)  - IOP okay at 11             - stop Prolensa  - start PF taper -- 3,2,1 drops daily, decreasing every 2 weeks, then stop  - ATs QID OU              - f/u 8 weeks -- DFE/OCT   3-5. Rhegmatogenous retinal detachment, right eye  - bullous, superior, mac off detachment, onset of foveal involvement 11.11.20 by history  - Bi-lobed superior detachment, superior lobe spanning 1000-0130, nasal lobe 0130-0400  - lattice degeneration with microtears noted at 1030  - s/p PPV/PFC/EL/FAX/14% C3F8 OD, 11.12.20             - intraop: HST at 2 oclock was found; also detachment had progressed to subtotal detachment spanning 9 oclock to 6 oclock (going in clockwise direction); also severe zonular insufficiency with IOL very mobile throughout case -- did not dislocate completely during the case or immediately post op  - doing well              - retina attached and in good position-good laser in place  6. History of retinal defects s/p laser retinopexy OU with Dr. Zigmund Daniel in 2013  - OD laser at 0900  - OS laser at 1030  - stable  7. Diabetes mellitus, type 2 without retinopathy  - The incidence, risk factors for progression,  natural history and treatment options for diabetic retinopathy  were discussed with patient.    - The need for close monitoring of blood glucose, blood pressure, and serum lipids, avoiding cigarette or any type of tobacco, and the need for long term follow up was also discussed with patient  8,9. Hypertensive retinopathy OU  - discussed importance of tight BP control  - monitor  10. Pseudophakia OU  - s/p CE/IOL OU (Dr. [name])  - 3 piece IOL OD was completely displaced into vitreous -- now with sutured Akreos IOL in good position OD -- see above  - OS 3-piece IOL in good position  - monitor  11. CME OD  - see above  - post op edema  - mild worsening on PF and prolensa QID OD  - s/p STK OD 07.09.21  - CME resolved today  - stop prolensa and taper PF slowly as above  Ophthalmic Meds Ordered this visit:  No orders of the defined types were placed in this encounter.      Return in about 8 weeks (around 05/13/2020) for Follow up 8 weeks Dislocated IOL OD, DFE, OCT.  There are no Patient Instructions on file for this visit.   Explained the diagnoses, plan, and follow up with the patient and they expressed understanding.  Patient expressed understanding of the importance of proper follow up care.  This document serves as a record of services personally performed by Gardiner Sleeper, MD, PhD. It was created on their behalf by Roselee Nova, COMT. The creation of this record is the provider's dictation and/or activities during the visit.  Electronically signed by: Roselee Nova, COMT 03/18/20 11:24 PM  Gardiner Sleeper, M.D., Ph.D. Diseases & Surgery of the Retina and Vitreous [facility]  I have reviewed the above documentation for accuracy and completeness, and I  agree with the above. Gardiner Sleeper, M.D., Ph.D. 03/18/20 11:24 PM   Abbreviations: M myopia (nearsighted); A astigmatism; H hyperopia (farsighted); P presbyopia; Mrx spectacle prescription;  CTL  contact lenses; OD right eye; OS left eye; OU both eyes  XT exotropia; ET esotropia; PEK punctate epithelial keratitis; PEE punctate epithelial erosions; DES dry eye syndrome; MGD meibomian gland dysfunction; ATs artificial tears; PFAT's preservative free artificial tears; [place] nuclear sclerotic cataract; PSC posterior subcapsular cataract; ERM epi-retinal membrane; PVD posterior vitreous detachment; RD retinal detachment; DM diabetes mellitus; DR diabetic retinopathy; NPDR non-proliferative diabetic retinopathy; PDR proliferative diabetic retinopathy; CSME clinically significant macular edema; DME diabetic macular edema; dbh dot blot hemorrhages; CWS cotton wool spot; POAG primary open angle glaucoma; C/D cup-to-disc ratio; HVF humphrey visual field; GVF goldmann visual field; OCT optical coherence tomography; IOP intraocular pressure; BRVO Branch retinal vein occlusion; CRVO central retinal vein occlusion; CRAO central retinal artery occlusion; BRAO branch retinal artery occlusion; RT retinal tear; SB scleral buckle; PPV pars plana vitrectomy; VH Vitreous hemorrhage; PRP panretinal laser photocoagulation; IVK intravitreal kenalog; VMT vitreomacular traction; MH Macular hole;  NVD neovascularization of the disc; NVE neovascularization elsewhere; AREDS age related eye disease study; ARMD age related macular degeneration; POAG primary open angle glaucoma; EBMD epithelial/anterior basement membrane dystrophy; ACIOL anterior chamber intraocular lens; IOL intraocular lens; PCIOL posterior chamber intraocular lens; Phaco/IOL phacoemulsification with intraocular lens placement; [place] photorefractive keratectomy; LASIK laser assisted in situ keratomileusis; HTN hypertension; DM diabetes mellitus; COPD chronic obstructive pulmonary disease

## 2020-03-18 ENCOUNTER — Other Ambulatory Visit: Payer: Self-pay

## 2020-03-18 ENCOUNTER — Encounter (INDEPENDENT_AMBULATORY_CARE_PROVIDER_SITE_OTHER): Payer: Self-pay | Admitting: Ophthalmology

## 2020-03-18 ENCOUNTER — Ambulatory Visit (INDEPENDENT_AMBULATORY_CARE_PROVIDER_SITE_OTHER): Payer: Medicare Other | Admitting: Ophthalmology

## 2020-03-18 DIAGNOSIS — H35411 Lattice degeneration of retina, right eye: Secondary | ICD-10-CM | POA: Diagnosis not present

## 2020-03-18 DIAGNOSIS — H2701 Aphakia, right eye: Secondary | ICD-10-CM

## 2020-03-18 DIAGNOSIS — H3321 Serous retinal detachment, right eye: Secondary | ICD-10-CM | POA: Diagnosis not present

## 2020-03-18 DIAGNOSIS — T8522XS Displacement of intraocular lens, sequela: Secondary | ICD-10-CM | POA: Diagnosis not present

## 2020-03-18 DIAGNOSIS — E119 Type 2 diabetes mellitus without complications: Secondary | ICD-10-CM

## 2020-03-18 DIAGNOSIS — I10 Essential (primary) hypertension: Secondary | ICD-10-CM

## 2020-03-18 DIAGNOSIS — H35033 Hypertensive retinopathy, bilateral: Secondary | ICD-10-CM

## 2020-03-18 DIAGNOSIS — Z9889 Other specified postprocedural states: Secondary | ICD-10-CM

## 2020-03-18 DIAGNOSIS — H3581 Retinal edema: Secondary | ICD-10-CM

## 2020-03-18 DIAGNOSIS — Z961 Presence of intraocular lens: Secondary | ICD-10-CM

## 2020-03-18 DIAGNOSIS — H35351 Cystoid macular degeneration, right eye: Secondary | ICD-10-CM

## 2020-04-21 ENCOUNTER — Other Ambulatory Visit: Payer: Self-pay | Admitting: Cardiology

## 2020-04-24 ENCOUNTER — Encounter: Payer: Self-pay | Admitting: Gastroenterology

## 2020-04-24 ENCOUNTER — Other Ambulatory Visit: Payer: Self-pay

## 2020-04-24 ENCOUNTER — Ambulatory Visit (AMBULATORY_SURGERY_CENTER): Payer: Self-pay | Admitting: *Deleted

## 2020-04-24 VITALS — Ht 66.0 in | Wt 216.0 lb

## 2020-04-24 DIAGNOSIS — D509 Iron deficiency anemia, unspecified: Secondary | ICD-10-CM

## 2020-04-24 DIAGNOSIS — K625 Hemorrhage of anus and rectum: Secondary | ICD-10-CM

## 2020-04-24 MED ORDER — PLENVU 140 G PO SOLR
1.0000 | Freq: Once | ORAL | 0 refills | Status: AC
Start: 2020-04-24 — End: 2020-04-24

## 2020-04-24 NOTE — Progress Notes (Signed)
Patient is here in-person for PV. per pt no medical hx changes since GI OV. Patient denies any allergies to eggs or soy. Patient denies any problems with anesthesia/sedation. Patient denies any oxygen use at home. Patient denies taking any diet/weight loss medications. ON  blood thinners. Patient is not being treated for MRSA or C-diff. Patient is aware of our care-partner policy and [id number] safety protocol.   COVID-19 vaccines completed on 09/09/19, per patient.   Prep Prescription coupon given to the patient.

## 2020-04-30 ENCOUNTER — Ambulatory Visit (AMBULATORY_SURGERY_CENTER): Payer: Medicare Other | Admitting: Gastroenterology

## 2020-04-30 ENCOUNTER — Encounter: Payer: Self-pay | Admitting: Gastroenterology

## 2020-04-30 ENCOUNTER — Other Ambulatory Visit: Payer: Self-pay

## 2020-04-30 VITALS — BP 138/61 | HR 54 | Temp 97.1°F | Resp 13

## 2020-04-30 DIAGNOSIS — K625 Hemorrhage of anus and rectum: Secondary | ICD-10-CM | POA: Diagnosis not present

## 2020-04-30 DIAGNOSIS — D122 Benign neoplasm of ascending colon: Secondary | ICD-10-CM | POA: Diagnosis not present

## 2020-04-30 DIAGNOSIS — K648 Other hemorrhoids: Secondary | ICD-10-CM

## 2020-04-30 DIAGNOSIS — K449 Diaphragmatic hernia without obstruction or gangrene: Secondary | ICD-10-CM | POA: Diagnosis not present

## 2020-04-30 DIAGNOSIS — K317 Polyp of stomach and duodenum: Secondary | ICD-10-CM | POA: Diagnosis not present

## 2020-04-30 DIAGNOSIS — D509 Iron deficiency anemia, unspecified: Secondary | ICD-10-CM | POA: Diagnosis not present

## 2020-04-30 MED ORDER — SODIUM CHLORIDE 0.9 % IV SOLN: 500.0000 mL | Freq: Once | INTRAVENOUS | Status: DC

## 2020-04-30 MED ORDER — DEXTROSE 5 % IV SOLN: Freq: Once | INTRAVENOUS | Status: DC

## 2020-04-30 NOTE — Op Note (Signed)
[facility] [facility] Patient Name: Cody Phelps Procedure Date: 04/30/2020 2:33 PM MRN: [id number] Endoscopist: Mallie Mussel L. Loletha Carrow , MD Age: 79 Referring MD:  Date of Birth: [birth year] Gender: Male Account #: [id number] Procedure:                Colonoscopy Indications:              Rectal bleeding, Iron deficiency anemia                            (multifactorial - outlined in prior clinic notes) Medicines:                Monitored Anesthesia Care Procedure:                Pre-Anesthesia Assessment:                           - Prior to the procedure, a History and Physical                            was performed, and patient medications and                            allergies were reviewed. The patient's tolerance of                            previous anesthesia was also reviewed. The risks                            and benefits of the procedure and the sedation                            options and risks were discussed with the patient.                            All questions were answered, and informed consent                            was obtained. Prior Anticoagulants: The patient has                            taken Xarelto (rivaroxaban), last dose was 2 days                            prior to procedure. ASA Grade Assessment: III - A                            patient with severe systemic disease. After                            reviewing the risks and benefits, the patient was                            deemed in satisfactory condition to undergo the  procedure.                           After obtaining informed consent, the colonoscope                            was passed under direct vision. Throughout the                            procedure, the patient's blood pressure, pulse, and                            oxygen saturations were monitored continuously. The                            Colonoscope was introduced through the anus and                             advanced to the the cecum, identified by                            appendiceal orifice and ileocecal valve. The                            colonoscopy was somewhat difficult due to a                            redundant colon and a tortuous colon. Successful                            completion of the procedure was aided by using                            manual pressure. The patient tolerated the                            procedure well. The quality of the bowel                            preparation was good. The ileocecal valve,                            appendiceal orifice, and rectum were photographed. Scope In: 2:43:46 PM Scope Out: 2:58:46 PM Scope Withdrawal Time: 0 hours 8 minutes 35 seconds  Total Procedure Duration: 0 hours 15 minutes 0 seconds  Findings:                 The perianal and digital rectal examinations were                            normal.                           A 6 mm polyp was found in the ascending colon. The  polyp was sessile. The polyp was removed with a                            cold snare. Resection and retrieval were complete.                           Internal hemorrhoids were found. The hemorrhoids                            were medium-sized.                           The exam was otherwise without abnormality on                            direct and retroflexion views. Complications:            No immediate complications. Estimated Blood Loss:     Estimated blood loss was minimal. Impression:               - One 6 mm polyp in the ascending colon, removed                            with a cold snare. Resected and retrieved.                           - Internal hemorrhoids.                           - The examination was otherwise normal on direct                            and retroflexion views. Recommendation:           - Patient has a contact number available for                             emergencies. The signs and symptoms of potential                            delayed complications were discussed with the                            patient. Return to normal activities tomorrow.                            Written discharge instructions were provided to the                            patient.                           - Resume previous diet.                           - Resume Xarelto (rivaroxaban) at prior dose  tomorrow.                           - Await pathology results.                           - No repeat colonoscopy.                           - See the other procedure note for documentation of                            additional recommendations.                           - Avoid sitting on toilet for prolonged priods and                            straining. Alayjah Boehringer L. Loletha Carrow, MD 04/30/2020 3:10:06 PM This report has been signed electronically. CC Letter to:             Dr. [name] (Hematology)

## 2020-04-30 NOTE — Progress Notes (Signed)
Called to room to assist during endoscopic procedure.  Patient ID and intended procedure confirmed with present staff. Received instructions for my participation in the procedure from the performing physician.  

## 2020-04-30 NOTE — Patient Instructions (Signed)
YOU HAD AN ENDOSCOPIC PROCEDURE TODAY AT THE [facility]:   Refer to the procedure report that was given to you for any specific questions about what was found during the examination.  If the procedure report does not answer your questions, please call your gastroenterologist to clarify.  If you requested that your care partner not be given the details of your procedure findings, then the procedure report has been included in a sealed envelope for you to review at your convenience later.  YOU SHOULD EXPECT: Some feelings of bloating in the abdomen. Passage of more gas than usual.  Walking can help get rid of the air that was put into your GI tract during the procedure and reduce the bloating. If you had a lower endoscopy (such as a colonoscopy or flexible sigmoidoscopy) you may notice spotting of blood in your stool or on the toilet paper. If you underwent a bowel prep for your procedure, you may not have a normal bowel movement for a few days.  Please Note:  You might notice some irritation and congestion in your nose or some drainage.  This is from the oxygen used during your procedure.  There is no need for concern and it should clear up in a day or so.  SYMPTOMS TO REPORT IMMEDIATELY:   Following lower endoscopy (colonoscopy or flexible sigmoidoscopy):  Excessive amounts of blood in the stool  Significant tenderness or worsening of abdominal pains  Swelling of the abdomen that is new, acute  Fever of 100F or higher   Following upper endoscopy (EGD)  Vomiting of blood or coffee ground material  New chest pain or pain under the shoulder blades  Painful or persistently difficult swallowing  New shortness of breath  Fever of 100F or higher  Black, tarry-looking stools  For urgent or emergent issues, a gastroenterologist can be reached at any hour by calling [phone]. Do not use MyChart messaging for urgent concerns.    DIET:  We do recommend a small meal at first, but  then you may proceed to your regular diet.  Drink plenty of fluids but you should avoid alcoholic beverages for 24 hours.  ACTIVITY:  You should plan to take it easy for the rest of today and you should NOT DRIVE or use heavy machinery until tomorrow (because of the sedation medicines used during the test).    FOLLOW UP: Our staff will call the number listed on your records 48-72 hours following your procedure to check on you and address any questions or concerns that you may have regarding the information given to you following your procedure. If we do not reach you, we will leave a message.  We will attempt to reach you two times.  During this call, we will ask if you have developed any symptoms of COVID 19. If you develop any symptoms (ie: fever, flu-like symptoms, shortness of breath, cough etc.) before then, please call [phone].  If you test positive for Covid 19 in the 2 weeks post procedure, please call and report this information to us.    If any biopsies were taken you will be contacted by phone or by letter within the next 1-3 weeks.  Please call us at [phone] if you have not heard about the biopsies in 3 weeks.    SIGNATURES/CONFIDENTIALITY: You and/or your care partner have signed paperwork which will be entered into your electronic medical record.  These signatures attest to the fact that that the information above on   your After Visit Summary has been reviewed and is understood.  Full responsibility of the confidentiality of this discharge information lies with you and/or your care-partner. 

## 2020-04-30 NOTE — Op Note (Signed)
[place] Patient Name: Cody Phelps Procedure Date: 04/30/2020 2:33 PM MRN: [id number] Endoscopist: Mallie Mussel L. Loletha Phelps , MD Age: 79 Referring MD:  Date of Birth: 10-Jun-1941 Gender: Male Account #: [id number] Procedure:                Upper GI endoscopy Indications:              Iron deficiency anemia (years, multifactorial -                            outlined in prior clinic notes) Medicines:                Monitored Anesthesia Care Procedure:                Pre-Anesthesia Assessment:                           - Prior to the procedure, a History and Physical                            was performed, and patient medications and                            allergies were reviewed. The patient's tolerance of                            previous anesthesia was also reviewed. The risks                            and benefits of the procedure and the sedation                            options and risks were discussed with the patient.                            All questions were answered, and informed consent                            was obtained. Prior Anticoagulants: The patient has                            taken Xarelto (rivaroxaban), last dose was 2 days                            prior to procedure. ASA Grade Assessment: III - A                            patient with severe systemic disease. After                            reviewing the risks and benefits, the patient was                            deemed in satisfactory condition to undergo the  procedure.                           After obtaining informed consent, the endoscope was                            passed under direct vision. Throughout the                            procedure, the patient's blood pressure, pulse, and                            oxygen saturations were monitored continuously. The                            Endoscope was introduced through the mouth, and                             advanced to the second part of duodenum. The upper                            GI endoscopy was accomplished without difficulty.                            The patient tolerated the procedure well. Scope In: Scope Out: Findings:                 The lower third of the esophagus was mildly                            tortuous (due to hiatal hernia).                           A 5 cm hiatal hernia was present. No erosions were                            seen within the herniated stomach.                           Multiple sessile fundic gland polyps with stigmata                            of recent bleeding (scant hematin) were found in                            the gastric fundus and in the gastric body.                           The exam of the stomach was otherwise normal.                           The cardia and gastric fundus were normal on                            retroflexion.  The examined duodenum was normal. Complications:            No immediate complications. Estimated Blood Loss:     Estimated blood loss: none. Impression:               - Tortuous esophagus.                           - 5 cm hiatal hernia.                           - Multiple fundic gland polyps.                           - Normal examined duodenum.                           - No specimens collected. Recommendation:           - Patient has a contact number available for                            emergencies. The signs and symptoms of potential                            delayed complications were discussed with the                            patient. Return to normal activities tomorrow.                            Written discharge instructions were provided to the                            patient.                           - Resume previous diet.                           - Resume Xarelto (rivaroxaban) at prior dose                            tomorrow.                            - See the other procedure note for documentation of                            additional recommendations.                           - Regular follow-up with Hematology is essential to                            monitor hemoglobin and iron levels as well as for  periodic IV iron. Cody Goynes L. Loletha Carrow, MD 04/30/2020 3:15:17 PM This report has been signed electronically. CC Letter to:             Dr. [name] (Hematology)

## 2020-05-04 ENCOUNTER — Telehealth: Payer: Self-pay | Admitting: *Deleted

## 2020-05-04 ENCOUNTER — Telehealth: Payer: Self-pay

## 2020-05-04 NOTE — Telephone Encounter (Signed)
LVM

## 2020-05-04 NOTE — Telephone Encounter (Signed)
No answer for post procedure call back. Unable to leave message. 

## 2020-05-09 ENCOUNTER — Encounter: Payer: Self-pay | Admitting: Gastroenterology

## 2020-05-11 NOTE — Progress Notes (Signed)
[facility] Note  05/12/2020     CHIEF COMPLAINT Patient presents for Retina Follow Up   HISTORY OF PRESENT ILLNESS: Cody Phelps is a 79 y.o. male who presents to the clinic today for:  HPI    Retina Follow Up    Patient presents with  Other.  In right eye.  This started 8 weeks ago.  I, the attending physician,  performed the HPI with the patient and updated documentation appropriately.          Comments    Patient here for 8 weeks retina follow up for Dislocated IOL OD. Patient states vision about the same. A little better. Notices a difference. About to clear up. No eye pain. Things look smaller in OD than in OS.       Last edited by [name], MD on 05/13/2020  7:06 PM. (History)    Pts wife states they are still using PF once a day in the right eye, pt has an appt to get glasses with Dr. Parke Simmers next week   Referring physician: [place], [place], [place],  [place] [address]  HISTORICAL INFORMATION:   Selected notes from the [facility] Referred by Dr. [name] for concern of RD OD   CURRENT MEDICATIONS: Current Outpatient Medications (Ophthalmic Drugs)  Medication Sig   dorzolamide-timolol (COSOPT) 22.3-6.8 MG/ML ophthalmic solution Place 1 drop into the right eye 2 (two) times daily.   prednisoLONE acetate (PRED FORTE) 1 % ophthalmic suspension Place 1 drop into the right eye every 2 (two) hours while awake.   No current facility-administered medications for this visit. (Ophthalmic Drugs)   Current Outpatient Medications (Other)  Medication Sig   atorvastatin (LIPITOR) 80 MG tablet TAKE 1 TABLET BY MOUTH  DAILY   colesevelam (WELCHOL) 625 MG tablet Take 625 mg by mouth 3 (three) times daily.    donepezil (ARICEPT) 10 MG tablet Take 10 mg by mouth every morning.    esomeprazole (NEXIUM) 40 MG capsule Take 40 mg by mouth daily.    ferrous sulfate 325 (65 FE) MG tablet Take 1 tablet (325 mg  total) by mouth daily with breakfast.   gabapentin (NEURONTIN) 300 MG capsule    insulin glargine (LANTUS SOLOSTAR) 100 UNIT/ML injection Inject 20-40 Units into the skin at bedtime as needed (BGL below 140-150 take 20 units, if 151 or higher take 40 units).    memantine (NAMENDA) 5 MG tablet Take 5 mg by mouth 2 (two) times daily.    metoprolol tartrate (LOPRESSOR) 25 MG tablet TAKE 1 TABLET BY MOUTH  DAILY   mirabegron ER (MYRBETRIQ) 50 MG TB24 tablet Take 50 mg by mouth at bedtime.    omega-3 acid ethyl esters (LOVAZA) 1 G capsule Take 2 g by mouth daily.    rOPINIRole (REQUIP) 2 MG tablet Take 2 mg by mouth 3 (three) times daily as needed (RLS).    spironolactone (ALDACTONE) 25 MG tablet Take 0.5 tablets (12.5 mg total) by mouth daily.   tamsulosin (FLOMAX) 0.4 MG CAPS capsule Take 0.4 mg by mouth daily after supper.    torsemide (DEMADEX) 20 MG tablet Take 1 tablet (20 mg total) by mouth 2 (two) times daily as needed (Leg swelling).   valsartan-hydrochlorothiazide (DIOVAN-HCT) 80-12.5 MG tablet TAKE 1 TABLET BY MOUTH IN  THE MORNING   vitamin B-12 (CYANOCOBALAMIN) 1000 MCG tablet Take 1,000 mcg by mouth 2 (two) times daily.   XARELTO 15 MG TABS tablet  TAKE 1 TABLET BY MOUTH IN  THE EVENING AFTER DINNER   Current Facility-Administered Medications (Other)  Medication Route   dextrose 5 % solution Intravenous      REVIEW OF SYSTEMS: ROS    Positive for: Musculoskeletal, Endocrine, Cardiovascular, Eyes, Respiratory   Negative for: Constitutional, Gastrointestinal, Neurological, Skin, Genitourinary, HENT, Psychiatric, Allergic/Imm, Heme/Lymph   Last edited by [name], COA on 05/12/2020  2:09 PM. (History)       ALLERGIES No Known Allergies  PAST MEDICAL HISTORY Past Medical History:  Diagnosis Date   Acute anterior wall MI ([place]) 03/01/2018   Acute combined systolic and diastolic heart failure ([place]) 03/15/2018   Bronchitis, acute 05/04/2012   Burn     Carotid stenosis, left followed by dr [name], cardiologist--- bilateral bruit per note   per dr [name] note [id number]  LICA 20-25% stenosis per duplex 05-14-2014   Chronic diastolic CHF (congestive heart failure) ([place]) 09/21/2018   Defect, retina, with detachment    Dementia ([place])    Diverticulosis    DM (diabetes mellitus) type 2, uncontrolled, with ketoacidosis ([place]) 11/25/2013   ED (erectile dysfunction)    First degree heart block    GERD (gastroesophageal reflux disease)    Heart murmur    History of blood transfusion    with open heart surgery   Hypertension    cardiologsit-  dr [name]   Hypertensive retinopathy    OU   Iron deficiency anemia    Mixed hyperlipidemia    Morbid obesity ([place])    Myocardial infarction ([place])    02/28/18   OA (osteoarthritis)    right hip   OSA on CPAP    followed by dr dohmeier, uses CPAP   PAF (paroxysmal atrial fibrillation) ([place])    a. on Eliquis   Prostate cancer ([place])    dx 03-17-2017 (bx)  Stage T2a, Gleason 4+4, PSA  4.43, vol 32.32--  s/p radiatctive prostate seed implants 07-10-2017 then IMRT and ADT   Retinal detachment    Rheg. 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   LIMA to Diagonal Branch   S/P [place]aze operation for atrial fibrillation 05/30/2018   Complete bilateral atrial lesion set using bipolar radiofrequency and cryothermy ablation with clipping of LA appendage   Scoliosis    Severe aortic stenosis    Trigger finger    Type 2 diabetes mellitus ([place])    Wears partial dentures    upper and lower   Past Surgical History:  Procedure Laterality Date   AORTIC VALVE REPLACEMENT N/A 05/30/2018   Procedure: AORTIC VALVE REPLACEMENT (AVR) using Inspiris Valve, Size 23;  Surgeon: [name], MD;  Location: [facility];  Service: Open Heart  Surgery;  Laterality: N/A;   APPENDECTOMY  1988   BILATERAL TOTAL ETHMOIDECTOMY AND SPHENOIDECTOMY  01-14-2009    dr Redmond Baseman   [facility]   CARDIOVERSION N/A 08/10/2018   Procedure: CARDIOVERSION;  Surgeon: Adrian Prows, MD;  Location: [facility];  Service: Cardiovascular;  Laterality: N/A;   CARPAL TUNNEL RELEASE Bilateral 2013   CATARACT EXTRACTION Bilateral    Dr. [name]   CATARACT EXTRACTION W/ INTRAOCULAR LENS  IMPLANT, BILATERAL  date?   CORONARY ARTERY BYPASS GRAFT N/A 05/30/2018   Procedure: CORONARY ARTERY BYPASS GRAFTING (CABG) x one using left internal mammary artery;  Surgeon: Roxy Manns,  Valentina Gu, MD;  Location: [facility];  Service: Open Heart Surgery;  Laterality: N/A;   CORONARY BALLOON ANGIOPLASTY N/A 03/01/2018   Procedure: CORONARY BALLOON ANGIOPLASTY;  Surgeon: Adrian Prows, MD;  Location: [facility];  Service: Cardiovascular;  Laterality: N/A;   CORONARY/GRAFT ACUTE MI REVASCULARIZATION N/A 03/01/2018   Procedure: Coronary/Graft Acute MI Revascularization;  Surgeon: Adrian Prows, MD;  Location: [facility];  Service: Cardiovascular;  Laterality: N/A;   CYSTOSCOPY  07/19/2017   Procedure: CYSTOSCOPY;  Surgeon: Nickie Retort, MD;  Location: [facility];  Service: Urology;;  No seeds found in [place] Bilateral    Cat Sx OU.  Dislocated IOL sx OD.  Rheg. RD repair OS.   EYE SURGERY     GAS/FLUID EXCHANGE Right 06/13/2019   Procedure: GAS/FLUID EXCHANGE;  Surgeon: [name], MD;  Location: [facility];  Service: Ophthalmology;  Laterality: Right;   LAMINECTOMY WITH POSTERIOR LATERAL ARTHRODESIS LEVEL 2 N/A 02/12/2014   Procedure: LUMBAR TWO-THREE,LUIMBAR THREE-FOUR LAMINECTOMY/FORAMINOTOMY;POSSIBLE POSTEROLATERAL ARTHRODESIS WITH AUTOGRAFT;  Surgeon: [name], MD;  Location: [facility];  Service: Neurosurgery;  Laterality: N/A;   LEFT HEART CATH AND CORONARY ANGIOGRAPHY N/A 03/01/2018   Procedure: LEFT HEART CATH AND CORONARY  ANGIOGRAPHY;  Surgeon: Adrian Prows, MD;  Location: [facility];  Service: Cardiovascular;  Laterality: N/A;   MAZE N/A 05/30/2018   Procedure: MAZE;  Surgeon: [name], MD;  Location: [facility];  Service: Open Heart Surgery;  Laterality: N/A;   ORIF RIGHT ANKLE FX'S  05/05/2001   retained hardware   PARS PLANA VITRECTOMY Right 06/13/2019   Procedure: PARS PLANA VITRECTOMY WITH 25 GAUGE WITH LASER AND GAS;  Surgeon: [name], MD;  Location: [facility];  Service: Ophthalmology;  Laterality: Right;   PARS PLANA VITRECTOMY Right 09/12/2019   Procedure: Pars Plana Vitrectomy With 25 Gauge;  Surgeon: [name], MD;  Location: [facility];  Service: Ophthalmology;  Laterality: Right;   PERFLUORONE INJECTION Right 06/13/2019   Procedure: PERFLUORONE INJECTION;  Surgeon: [name], MD;  Location: [facility];  Service: Ophthalmology;  Laterality: Right;   PHOTOCOAGULATION WITH LASER Right 06/13/2019   Procedure: PHOTOCOAGULATION WITH LASER;  Surgeon: [name], MD;  Location: [facility];  Service: Ophthalmology;  Laterality: Right;   PLACEMENT AND SUTURE OF SECONDARY INTRAOCULAR LENS Right 09/12/2019   Procedure: PLACEMENT AND SUTURE OF SECONDARY INTRAOCULAR LENS;  Surgeon: [name], MD;  Location: [facility];  Service: Ophthalmology;  Laterality: Right;   PROSTATE BIOPSY  03-17-2017   dr Pilar Jarvis office   RADIOACTIVE SEED IMPLANT N/A 07/19/2017   Procedure: RADIOACTIVE SEED IMPLANT/BRACHYTHERAPY IMPLANT;  Surgeon: Nickie Retort, MD;  Location[facility];  Service: Urology;  Laterality: N/A;  69 seeds implanted   REMOVAL RETAINED LENS Right 09/12/2019   Procedure: REMOVAL RETAINED LENS;  Surgeon: [name], MD;  Location: [facility];  Service: Ophthalmology;  Laterality: Right;   RETINAL DETACHMENT SURGERY Right 06/13/2019   Dr. Coralyn Pear   SKIN GRAFT     face   SPACE OAR INSTILLATION N/A 07/19/2017   Procedure: SPACE OAR INSTILLATION;  Surgeon: Nickie Retort,  MD;  Location: [facility];  Service: Urology;  Laterality: N/A;   TEE WITHOUT CARDIOVERSION  03/20/2012   Procedure: TRANSESOPHAGEAL ECHOCARDIOGRAM (TEE);  Surgeon: Laverda Page, MD;  Location: [facility];  Service: Cardiovascular;  Laterality: N/A;  normal LV; normal EF; normal RV; normal LA w/ left atrial appendage  very small, normal function, interatrial septum intact without defect; normal RA; trace MR,TR, & PI; mild AV calcification and senile degeneration w/ mild stenosis, AVA 1.7cm^2;;    TEE WITHOUT CARDIOVERSION N/A 05/30/2018   Procedure: TRANSESOPHAGEAL ECHOCARDIOGRAM (TEE);  Surgeon: [name], MD;  Location: [facility];  Service: Open Heart Surgery;  Laterality: N/A;   TOTAL HIP ARTHROPLASTY Right 10/23/2017   Procedure: TOTAL HIP ARTHROPLASTY ANTERIOR APPROACH;  Surgeon: Frederik Pear, MD;  Location: [facility];  Service: Orthopedics;  Laterality: Right;   TRANSTHORACIC ECHOCARDIOGRAM  01-11-2017   dr [name]  (per echo note, no significant change in seveity of AS, no other diagnostic change)   moderate concentric LVH, ef 74%, grade 1 diastolic dysfunction/  moderate LAE/  mild , grade 1 AR w/ moderate AV calcification, mild to moderate restricted AV leaflets w/ moderate AS, AVA 1.16cm^2, peak grandient 56mHg, mean grandient 321mg/  trace MR, mild calcification MV annulus , mild MV leaflet calcification, mild MVS, peak grandient 4.105m60m, mean grandient 2.7mm74m trace TR    FAMILY HISTORY Family History  Problem Relation Age of Onset   Stroke Mother    Hypertension Mother    Heart attack Father    Heart murmur Father    Hypertension Sister    Lung disease Brother    Colon cancer Neg Hx    Stomach cancer Neg Hx    Rectal cancer Neg Hx    Pancreatic cancer Neg Hx     SOCIAL HISTORY Social History   Tobacco Use   Smoking status: Former Smoker    Packs/day: 0.25    Years: 1.00    Pack years: 0.25    Types: Cigarettes    Quit date: 02/04/1967     Years since quitting: 53.3   Smokeless tobacco: Never Used   Tobacco comment: smoked 1 q2-3 days  Vaping Use   Vaping Use: Never used  Substance Use Topics   Alcohol use: Yes    Comment: occasional beer   Drug use: No         OPHTHALMIC EXAM:  Base Eye Exam    Visual Acuity (Snellen - Linear)      Right Left   Dist [place] 20/40 -2 20/20 -1   Dist ph [place] 20/30 +1        Tonometry (Tonopen, 2:05 PM)      Right Left   Pressure 18 15       Pupils      Dark Light Shape React APD   Right 4 3 Round Minimal None   Left 3 2 Round Brisk None       Visual Fields (Counting fingers)      Left Right    Full Full       Extraocular Movement      Right Left    Full Full       Neuro/Psych    Oriented x3: Yes   Mood/Affect: Normal       Dilation    Both eyes: 1.0% Mydriacyl, 2.5% Phenylephrine @ 2:05 PM        Slit Lamp and Fundus Exam    External Exam      Right Left   External Periorbital edema        Slit Lamp Exam      Right Left   Lids/Lashes mild Meibomian gland dysfunction, mild Telangiectasia Dermatochalasis - upper lid, Meibomian gland dysfunction, Edema, Erythema lower lid, positive concretion left palp conj   Conjunctiva/Sclera White and quiet;  residual STK in ST quadrant Temporal Pinguecula   Cornea well healed superior corneal wound, 1+Punctate epithelial erosions, Arcus, mild tear film debris 1+ Punctate epithelial erosions, +verticellata, trace endo pigment, Debris in tear film   Anterior Chamber Deep, trace pigment Deep and quiet   Iris Round and dilated to 54m, scattered Transillumination defects 360, +iridodenesis Round and dilated   Lens Sutured Akreos IOL well centered  3 piece Posterior chamber intraocular in good position, trace Posterior capsular opacification   Vitreous post vitrectomy, trace pigment Vitreous syneresis, Posterior vitreous detachment       Fundus Exam      Right Left   Disc 1+Pallor, Sharp rim 2+Pallor, Sharp rim,  temporal PPP, mild tilt   C/D Ratio 0.3 0.5   Macula Blunted foveal reflex, mild RPE mottling and clumping, stable resolution of central cystic changes --  scattered MA Flat, Blunted foveal reflex, Retinal pigment epithelial mottling and clumping, No heme or edema   Vessels Mild Vascular attenuation, mild Tortuousity, mild A/V crossing changes Vascular attenuation, mild Tortuous   Periphery attached, good 360 laser in place; ORIGINALLY: bullous superior retinal detachment from 1000-0130, another more shallow detachment lobe from 130-400, lattice and micro tears ST quadrant; Old retinal tear at 0900 with surrounding laser, Attached, peripheral laser scars at 1030, No new RT/RD          IMAGING AND PROCEDURES  Imaging and Procedures for _0 @  OCT, Retina - OU - Both Eyes       Right Eye Quality was good. Central Foveal Thickness: 266. Progression has been stable. Findings include normal foveal contour, no SRF, no IRF (Stable resolution of IRF/CME Nasal fovea; trace ERM).   Left Eye Quality was good. Central Foveal Thickness: 264. Progression has been stable. Findings include normal foveal contour, no IRF, no SRF.   Notes *Images captured and stored on drive  Diagnosis / Impression:  OD: stable resolution of IRF/CME Nasal fovea OS: NFP, no IRF/SRF   Clinical management:  See below  Abbreviations: NFP - Normal foveal profile. CME - cystoid macular edema. PED - pigment epithelial detachment. IRF - intraretinal fluid. SRF - subretinal fluid. EZ - ellipsoid zone. ERM - epiretinal membrane. ORA - outer retinal atrophy. ORT - outer retinal tubulation. SRHM - subretinal hyper-reflective material                 ASSESSMENT/PLAN:    ICD-10-CM   1. Dislocation of intraocular lens, sequela  T85.22XS   2. Aphakia of right eye  H27.01   3. Right retinal detachment  H33.21   4. 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 Retinal edema  H35.81 OCT, Retina - OU - Both Eyes   6. History of repair of retinal tear by laser photocoagulation  Z98.890   7. Diabetes mellitus type 2 without retinopathy ([place]  E11.9   8. Essential hypertension  I10   9. Hypertensive retinopathy of both eyes  H35.033   10. Pseudophakia of both eyes  Z96.1   11. Cystoid macular edema of right eye  H35.351    1,2. Dislocated IOL OD  - pt with history of partially dislocated 3-piece IOL -- spontaneously dislocated completely into vitreous cavity on 1.23.21  - s/p 25g PPV w/ IOL explantation and secondary sutured IOL OD -- Akreos AO60 lens, 17.5D -- 02.11.2021             - IOL in good position  - 2 superior nylon sutures removed at slit lamp  03.12.21 -- last nylon suture removed at slit lamp, 04.30.21  - corneal edema resolved; PEE improved             - s/p STK OD #1 (07.09.21) for CME  - BCVA stable at 20/30+2  - OCT shows stable improvement in IRF/CME nasal macula  - IOP okay at 18  - ATs QID OU              - f/u 3-4 months -- DFE/OCT   3-5. Rhegmatogenous retinal detachment, right eye  - bullous, superior, mac off detachment, onset of foveal involvement 11.11.20 by history  - Bi-lobed superior detachment, superior lobe spanning 1000-0130, nasal lobe 0130-0400  - lattice degeneration with microtears noted at 1030  - s/p PPV/PFC/EL/FAX/14% C3F8 OD, 11.12.20             - intraop: HST at 2 oclock was found; also detachment had progressed to subtotal detachment spanning 9 oclock to 6 oclock (going in clockwise direction); also severe zonular insufficiency with IOL very mobile throughout case -- did not dislocate completely during the case or immediately post op  - doing well              - retina attached and in good position-good laser in place  6. History of retinal defects s/p laser retinopexy OU with Dr. Zigmund Daniel in 2013  - OD laser at 0900  - OS laser at 1030  - stable  7. Diabetes mellitus, type 2 without retinopathy  - The incidence, risk factors for progression, natural  history and treatment options for diabetic retinopathy  were discussed with patient.    - The need for close monitoring of blood glucose, blood pressure, and serum lipids, avoiding cigarette or any type of tobacco, and the need for long term follow up was also discussed with patient  8,9. Hypertensive retinopathy OU  - discussed importance of tight BP control  - monitor  10. Pseudophakia OU  - s/p CE/IOL OU (Dr. [name])  - 3 piece IOL OD was completely displaced into vitreous -- now with sutured Akreos IOL in good position OD -- see above  - OS 3-piece IOL in good position  - monitor  11. CME OD  - see above  - post op edema  - mild worsening on PF and prolensa QID OD  - s/p STK OD 07.09.21  - CME stably resolved  - pt has remained on PF qdaily -- okay to stop  Ophthalmic Meds Ordered this visit:  No orders of the defined types were placed in this encounter.      Return for f/u 3-4 months dislocated IOL OD, DFE, OCT.  There are no Patient Instructions on file for this visit.   Explained the diagnoses, plan, and follow up with the patient and they expressed understanding.  Patient expressed understanding of the importance of proper follow up care.  This document serves as a record of services personally performed by Gardiner Sleeper, MD, PhD. It was created on their behalf by Estill Bakes, COT an ophthalmic technician. The creation of this record is the provider's dictation and/or activities during the visit.    Electronically signed by: Estill Bakes, COT 10.11.21 @ 7:13 PM   This document serves as a record of services personally performed by Gardiner Sleeper, MD, PhD. It was created on their behalf by San Jetty. Owens Shark, OA an ophthalmic technician. The creation of this record is the provider's dictation and/or activities during the visit.  Electronically signed by: San Jetty. Owens Shark, [place] 10.12.2021 7:13 PM  Gardiner Sleeper, M.D., Ph.D. Diseases & Surgery of the Retina and  [place] 05/12/2020   I have reviewed the above documentation for accuracy and completeness, and I agree with the above. Gardiner Sleeper, M.D., Ph.D. 05/13/20 7:13 PM    Abbreviations: M myopia (nearsighted); A astigmatism; H hyperopia (farsighted); P presbyopia; Mrx spectacle prescription;  CTL contact lenses; OD right eye; OS left eye; OU both eyes  XT exotropia; ET esotropia; PEK punctate epithelial keratitis; PEE punctate epithelial erosions; DES dry eye syndrome; MGD meibomian gland dysfunction; ATs artificial tears; PFAT's preservative free artificial tears; [place] nuclear sclerotic cataract; PSC posterior subcapsular cataract; ERM epi-retinal membrane; PVD posterior vitreous detachment; RD retinal detachment; DM diabetes mellitus; DR diabetic retinopathy; NPDR non-proliferative diabetic retinopathy; PDR proliferative diabetic retinopathy; CSME clinically significant macular edema; DME diabetic macular edema; dbh dot blot hemorrhages; CWS cotton wool spot; POAG primary open angle glaucoma; C/D cup-to-disc ratio; HVF humphrey visual field; GVF goldmann visual field; OCT optical coherence tomography; IOP intraocular pressure; BRVO Branch retinal vein occlusion; CRVO central retinal vein occlusion; CRAO central retinal artery occlusion; BRAO branch retinal artery occlusion; RT retinal tear; SB scleral buckle; PPV pars plana vitrectomy; VH Vitreous hemorrhage; PRP panretinal laser photocoagulation; IVK intravitreal kenalog; VMT vitreomacular traction; MH Macular hole;  NVD neovascularization of the disc; NVE neovascularization elsewhere; AREDS age related eye disease study; ARMD age related macular degeneration; POAG primary open angle glaucoma; EBMD epithelial/anterior basement membrane dystrophy; ACIOL anterior chamber intraocular lens; IOL intraocular lens; PCIOL posterior chamber intraocular lens; Phaco/IOL phacoemulsification with intraocular lens placement; [place]  photorefractive keratectomy; LASIK laser assisted in situ keratomileusis; HTN hypertension; DM diabetes mellitus; COPD chronic obstructive pulmonary disease

## 2020-05-12 ENCOUNTER — Encounter (INDEPENDENT_AMBULATORY_CARE_PROVIDER_SITE_OTHER): Payer: Self-pay | Admitting: Ophthalmology

## 2020-05-12 ENCOUNTER — Other Ambulatory Visit: Payer: Self-pay

## 2020-05-12 ENCOUNTER — Ambulatory Visit (INDEPENDENT_AMBULATORY_CARE_PROVIDER_SITE_OTHER): Payer: Medicare Other | Admitting: Ophthalmology

## 2020-05-12 DIAGNOSIS — H2701 Aphakia, right eye: Secondary | ICD-10-CM | POA: Diagnosis not present

## 2020-05-12 DIAGNOSIS — H3321 Serous retinal detachment, right eye: Secondary | ICD-10-CM | POA: Diagnosis not present

## 2020-05-12 DIAGNOSIS — I10 Essential (primary) hypertension: Secondary | ICD-10-CM

## 2020-05-12 DIAGNOSIS — T8522XS Displacement of intraocular lens, sequela: Secondary | ICD-10-CM | POA: Diagnosis not present

## 2020-05-12 DIAGNOSIS — H35033 Hypertensive retinopathy, bilateral: Secondary | ICD-10-CM

## 2020-05-12 DIAGNOSIS — H3581 Retinal edema: Secondary | ICD-10-CM

## 2020-05-12 DIAGNOSIS — H35351 Cystoid macular degeneration, right eye: Secondary | ICD-10-CM

## 2020-05-12 DIAGNOSIS — Z961 Presence of intraocular lens: Secondary | ICD-10-CM

## 2020-05-12 DIAGNOSIS — Z9889 Other specified postprocedural states: Secondary | ICD-10-CM

## 2020-05-12 DIAGNOSIS — H35411 Lattice degeneration of retina, right eye: Secondary | ICD-10-CM

## 2020-05-12 DIAGNOSIS — E119 Type 2 diabetes mellitus without complications: Secondary | ICD-10-CM

## 2020-05-13 ENCOUNTER — Encounter (INDEPENDENT_AMBULATORY_CARE_PROVIDER_SITE_OTHER): Payer: Self-pay | Admitting: Ophthalmology

## 2020-05-25 ENCOUNTER — Other Ambulatory Visit: Payer: Self-pay | Admitting: Nurse Practitioner

## 2020-05-25 DIAGNOSIS — R310 Gross hematuria: Secondary | ICD-10-CM

## 2020-05-27 ENCOUNTER — Other Ambulatory Visit: Payer: Self-pay | Admitting: Endocrinology

## 2020-05-27 DIAGNOSIS — R911 Solitary pulmonary nodule: Secondary | ICD-10-CM

## 2020-06-11 ENCOUNTER — Ambulatory Visit
Admission: RE | Admit: 2020-06-11 | Discharge: 2020-06-11 | Disposition: A | Discharge status: Home / Self Care | Payer: Medicare Other | Source: Ambulatory Visit | Attending: Endocrinology | Admitting: Endocrinology

## 2020-06-11 DIAGNOSIS — R911 Solitary pulmonary nodule: Secondary | ICD-10-CM

## 2020-07-01 ENCOUNTER — Other Ambulatory Visit: Payer: Self-pay | Admitting: Oncology

## 2020-07-01 DIAGNOSIS — D649 Anemia, unspecified: Secondary | ICD-10-CM

## 2020-07-02 ENCOUNTER — Inpatient Hospital Stay: Payer: Medicare Other | Attending: Oncology | Admitting: Oncology

## 2020-07-02 ENCOUNTER — Telehealth: Payer: Self-pay | Admitting: *Deleted

## 2020-07-02 ENCOUNTER — Other Ambulatory Visit: Payer: Self-pay

## 2020-07-02 ENCOUNTER — Inpatient Hospital Stay: Payer: Medicare Other

## 2020-07-02 VITALS — BP 127/53 | HR 59 | Temp 97.4°F | Resp 18 | Ht 66.0 in | Wt 216.1 lb

## 2020-07-02 DIAGNOSIS — Z952 Presence of prosthetic heart valve: Secondary | ICD-10-CM | POA: Diagnosis not present

## 2020-07-02 DIAGNOSIS — Z7901 Long term (current) use of anticoagulants: Secondary | ICD-10-CM | POA: Insufficient documentation

## 2020-07-02 DIAGNOSIS — I2510 Atherosclerotic heart disease of native coronary artery without angina pectoris: Secondary | ICD-10-CM | POA: Diagnosis not present

## 2020-07-02 DIAGNOSIS — R911 Solitary pulmonary nodule: Secondary | ICD-10-CM | POA: Diagnosis not present

## 2020-07-02 DIAGNOSIS — C61 Malignant neoplasm of prostate: Secondary | ICD-10-CM | POA: Insufficient documentation

## 2020-07-02 DIAGNOSIS — D649 Anemia, unspecified: Secondary | ICD-10-CM

## 2020-07-02 DIAGNOSIS — Z79899 Other long term (current) drug therapy: Secondary | ICD-10-CM | POA: Insufficient documentation

## 2020-07-02 LAB — CBC WITH DIFFERENTIAL (CANCER CENTER ONLY)
Abs Immature Granulocytes: 0.03 10*3/uL (ref 0.00–0.07)
Basophils Absolute: 0.1 10*3/uL (ref 0.0–0.1)
Basophils Relative: 1 %
Eosinophils Absolute: 0.3 10*3/uL (ref 0.0–0.5)
Eosinophils Relative: 5 %
HCT: 29.2 % — ABNORMAL LOW (ref 39.0–52.0)
Hemoglobin: 9.5 g/dL — ABNORMAL LOW (ref 13.0–17.0)
Immature Granulocytes: 1 %
Lymphocytes Relative: 17 %
Lymphs Abs: 0.9 10*3/uL (ref 0.7–4.0)
MCH: 28.9 pg (ref 26.0–34.0)
MCHC: 32.5 g/dL (ref 30.0–36.0)
MCV: 88.8 fL (ref 80.0–100.0)
Monocytes Absolute: 0.5 10*3/uL (ref 0.1–1.0)
Monocytes Relative: 10 %
Neutro Abs: 3.5 10*3/uL (ref 1.7–7.7)
Neutrophils Relative %: 66 %
Platelet Count: 153 10*3/uL (ref 150–400)
RBC: 3.29 MIL/uL — ABNORMAL LOW (ref 4.22–5.81)
RDW: 13.3 % (ref 11.5–15.5)
WBC Count: 5.3 10*3/uL (ref 4.0–10.5)
nRBC: 0 % (ref 0.0–0.2)

## 2020-07-02 LAB — IRON AND TIBC
Iron: 72 ug/dL (ref 42–163)
Saturation Ratios: 24 % (ref 20–55)
TIBC: 303 ug/dL (ref 202–409)
UIBC: 231 ug/dL (ref 117–376)

## 2020-07-02 LAB — FERRITIN: Ferritin: 222 ng/mL (ref 24–336)

## 2020-07-02 NOTE — Progress Notes (Signed)
Hematology and Oncology Follow Up Visit  Cody Phelps [id number] 1941-04-29 79 y.o. 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    Secondary diagnosis: Prostate cancer presented with localized disease Gleason score of 4+4 = 8 and a PSA 4.4 in 2018.  Prior Therapy:  Definitive radiation therapy utilizing IMRT between August 15, 2017 and September 18, 2017.  He also received brachytherapy boost upfront on January 4 of 2019.  He received 45 Gy in 25 fraction to supplement his post implant of 110 Gy.  He completed androgen deprivation therapy under the care of Dr. Junious Phelps for close to 2 years.  He status post IV iron infusion in August 2019.  He received Feraheme in November 2020.  Current therapy: Oral iron therapy.  Interim History: Cody Phelps returns today for a follow-up evaluation.  Since last visit, he reports no major changes in his health.  He denies any nausea vomiting or abdominal pain.  He continues to tolerate oral iron replacement without any complaints.  He denies any constipation or dyspepsia.  He denies any excessive fatigue or tiredness.       Medications: Updated on review. Current Outpatient Medications  Medication Sig Dispense Refill  . atorvastatin (LIPITOR) 80 MG tablet TAKE 1 TABLET BY MOUTH  DAILY 90 tablet 3  . colesevelam (WELCHOL) 625 MG tablet Take 625 mg by mouth 3 (three) times daily.     Marland Kitchen donepezil (ARICEPT) 10 MG tablet Take 10 mg by mouth every morning.     . dorzolamide-timolol (COSOPT) 22.3-6.8 MG/ML ophthalmic solution Place 1 drop into the right eye 2 (two) times daily. 10 mL 1  . esomeprazole (NEXIUM) 40 MG capsule Take 40 mg by mouth daily.     . ferrous sulfate 325 (65 FE) MG tablet Take 1 tablet (325 mg total) by mouth daily with breakfast. 90 tablet 3  . gabapentin (NEURONTIN) 300 MG capsule     .  insulin glargine (LANTUS SOLOSTAR) 100 UNIT/ML injection Inject 20-40 Units into the skin at bedtime as needed (BGL below 140-150 take 20 units, if 151 or higher take 40 units).     . memantine (NAMENDA) 5 MG tablet Take 5 mg by mouth 2 (two) times daily.     . metoprolol tartrate (LOPRESSOR) 25 MG tablet TAKE 1 TABLET BY MOUTH  DAILY 90 tablet 3  . mirabegron ER (MYRBETRIQ) 50 MG TB24 tablet Take 50 mg by mouth at bedtime.     Marland Kitchen omega-3 acid ethyl esters (LOVAZA) 1 G capsule Take 2 g by mouth daily.     . prednisoLONE acetate (PRED FORTE) 1 % ophthalmic suspension Place 1 drop into the right eye every 2 (two) hours while awake. 15 mL 1  . rOPINIRole (REQUIP) 2 MG tablet Take 2 mg by mouth 3 (three) times daily as needed (RLS).     Marland Kitchen spironolactone (ALDACTONE) 25 MG tablet Take 0.5 tablets (12.5 mg total) by mouth daily. 45 tablet 3  . tamsulosin (FLOMAX) 0.4 MG CAPS capsule Take 0.4 mg by mouth daily after supper.     . torsemide (DEMADEX) 20 MG tablet Take 1 tablet (20 mg total) by mouth 2 (two) times daily as needed (Leg swelling). 90 tablet 3  . valsartan-hydrochlorothiazide (DIOVAN-HCT) 80-12.5 MG tablet TAKE 1 TABLET BY MOUTH IN  THE MORNING 90 tablet 3  . vitamin B-12 (CYANOCOBALAMIN) 1000 MCG  tablet Take 1,000 mcg by mouth 2 (two) times daily.    Alveda Reasons 15 MG TABS tablet TAKE 1 TABLET BY MOUTH IN  THE EVENING AFTER DINNER 90 tablet 3   Current Facility-Administered Medications  Medication Dose Route Frequency Provider Last Rate Last Admin  . dextrose 5 % solution   Intravenous Once Cody Meuse III, MD         Allergies: No Known Allergies      Physical Exam:  Blood pressure (!) 127/53, pulse (!) 59, temperature (!) 97.4 F (36.3 C), temperature source Tympanic, resp. rate 18, height 5\' 6"  (1.676 m), weight 216 lb 1.6 oz (98 kg), SpO2 100 %.    ECOG: 1   General appearance: Comfortable appearing without any discomfort Head: Normocephalic without any  trauma Oropharynx: Mucous membranes are moist and pink without any thrush or ulcers. Eyes: Pupils are equal and round reactive to light. Lymph nodes: No cervical, supraclavicular, inguinal or axillary lymphadenopathy.   Heart:regular rate and rhythm.  S1 and S2 without leg edema. Lung: Clear without any rhonchi or wheezes.  No dullness to percussion. Abdomin: Soft, nontender, nondistended with good bowel sounds.  No hepatosplenomegaly. Musculoskeletal: No joint deformity or effusion.  Full range of motion noted. Neurological: No deficits noted on motor, sensory and deep tendon reflex exam. Skin: No petechial rash or dryness.  Appeared moist.        Lab Results: Lab Results  Component Value Date   WBC 5.3 07/02/2020   HGB 9.5 (L) 07/02/2020   HCT 29.2 (L) 07/02/2020   MCV 88.8 07/02/2020   PLT 153 07/02/2020     Chemistry      Component Value Date/Time   NA 137 09/12/2019 0956   K 3.7 09/12/2019 0956   CL 106 09/12/2019 0956   CO2 22 09/12/2019 0956   BUN 20 09/12/2019 0956   CREATININE 1.38 (H) 09/12/2019 0956      Component Value Date/Time   CALCIUM 9.0 09/12/2019 0956   ALKPHOS 47 05/28/2018 1345   AST 18 05/28/2018 1345   ALT 23 05/28/2018 1345   BILITOT 0.4 05/28/2018 1345      CLINICAL DATA:  Follow-up pulmonary nodule  EXAM: CT CHEST WITHOUT CONTRAST  TECHNIQUE: Multidetector CT imaging of the chest was performed following the standard protocol without IV contrast.  COMPARISON:  CT angiogram chest abdomen pelvis, 03/17/2018  FINDINGS: Cardiovascular: Status post aortic valve replacement. Scattered aortic atherosclerosis. Normal heart size. Dense mitral annulus calcifications. Left atrial appendage clip. Three-vessel coronary artery calcifications and/or stents. No pericardial effusion.  Mediastinum/Nodes: No enlarged mediastinal, hilar, or axillary lymph nodes. Small hiatal hernia. Thyroid gland, trachea, and esophagus demonstrate no  significant findings.  Lungs/Pleura: Scattered ground-glass opacity of the dependent left lung base, generally nonspecific and likely scarring and/or partial atelectasis. There is however a rounded ground-glass opacity of the central left lower lobe measuring 2.8 x 2.5 cm with a solid central nodule measuring 4 mm (series 3, image 83). No pleural effusion or pneumothorax.  Upper Abdomen: No acute abnormality.  Musculoskeletal: No chest wall mass or suspicious bone lesions identified.  IMPRESSION: 1. There is a rounded ground-glass opacity of the central left lower lobe measuring 2.8 x 2.5 cm with a solid central nodule measuring 4 mm. This is concerning for adenocarcinoma. Comparison to prior examination dated 03/17/2018 is not possible due to the presence of pleural effusions and atelectasis at that time. Given small size, PET-CT is likely of limited utility to assess for  metabolic activity. Recommend initial follow-up examination at 3-6 months to assess for persistence. If unchanged, and solid component remains <6 mm, annual CT is recommended until 5 years of stability has been established. If persistent these nodules should be considered highly suspicious if the solid component of the nodule is 6 mm or greater in size and enlarging. This recommendation follows the consensus statement: Guidelines for Management of Incidental Pulmonary Nodules Detected on CT Images: From the Fleischner Society 2017; Radiology 2017; 284:228-243. 2. Scattered ground-glass opacity of the dependent left lung base, generally nonspecific and likely scarring and/or partial atelectasis. 3. Coronary artery disease. 4. Status post aortic valve replacement. 5. Aortic Atherosclerosis (ICD10-I70.0).      Impression and Plan:  79 year old man with:    1.  Anemia diagnosed in 2019.  He has element of chronic disease and iron deficiency.    The natural course of this disease was reviewed and  treatment options were reiterated.  His hemoglobin is slightly down today but overall stable.  Iron studies are currently pending.  For the time being I recommended continuing oral iron replacement and use intravenous iron if his iron levels has declined.  Growth factor support in the form of Procrit or Aranesp can be utilized if his hemoglobin drops below 9.  2.  Localized prostate cancer after receiving definitive treatment in 2019.  Continues to follow with Dr. Junious Phelps.  3.  Left lower lung nodule: He is currently under evaluation with thoracic surgery regarding these findings.  Surgical resection versus biopsy versus radiation all possibility at this time.  4.  Follow-up: He will return in 6 months for repeat follow-up.  30  minutes were dedicated to this encounter.  Time was spent on reviewing disease status, reviewing laboratory data and imaging studies.   Zola Button, MD 12/2/20219:58 AM

## 2020-07-02 NOTE — Telephone Encounter (Signed)
-----   Message from Wyatt Portela, MD sent at 07/02/2020 12:54 PM EST ----- Please let him know his iron level is normal. No need to increase his iron intake.

## 2020-07-02 NOTE — Telephone Encounter (Signed)
Per Dr.Shadad, called pt with message below. Pt verbalized understanding and advised to f/u as scheduled.

## 2020-07-10 ENCOUNTER — Institutional Professional Consult (permissible substitution) (INDEPENDENT_AMBULATORY_CARE_PROVIDER_SITE_OTHER): Payer: Medicare Other | Admitting: Thoracic Surgery (Cardiothoracic Vascular Surgery)

## 2020-07-10 ENCOUNTER — Encounter: Payer: Self-pay | Admitting: Thoracic Surgery (Cardiothoracic Vascular Surgery)

## 2020-07-10 ENCOUNTER — Other Ambulatory Visit: Payer: Self-pay

## 2020-07-10 VITALS — BP 109/57 | HR 59 | Resp 20 | Ht 66.0 in | Wt 210.0 lb

## 2020-07-10 DIAGNOSIS — R918 Other nonspecific abnormal finding of lung field: Secondary | ICD-10-CM | POA: Diagnosis not present

## 2020-07-10 NOTE — Progress Notes (Signed)
Cody Phelps       Cody Phelps,Cody Phelps [id number]             [id number]                    Cody Phelps [place] Medical Record #[id number] Date of Birth: 08-13-1940  Referring: Cody Bowen, MD Primary Care: Cody Bowen, MD Primary Cardiologist: No primary care provider on file.  Chief Complaint:    Chief Complaint  Patient presents with  . Lung Lesion    Surgical consult, Chest CT 06/11/20, last PFT's 03/19/2018    History of Present Illness:    Cody Phelps 79 y.o. male referred for surgical evaluation of a left lower lobe pulmonary nodule that was found incidentally.  He has a history of aortic valve replacement and CABG performed by Dr. Roxy Manns, as well as a history of prostate cancer.  While undergoing his surveillance imaging left lower lobe pulmonary nodule was noted, and he subsequently underwent definitive chest imaging which further characterize this nodule.  He denies any weight changes, shortness of breath, neurologic symptoms, or sputum production.  He occasionally smoked pipes in the past, and used herbicides in his garden.  He has some mobility issues from a hip surgery in the past.     Zubrod Score: At the time of surgery this patient's most appropriate activity status/level should be described as: []     0    Normal activity, no symptoms [x]     1    Restricted in physical strenuous activity but ambulatory, able to do out light work []     2    Ambulatory and capable of self care, unable to do work activities, up and about               >50 % of waking hours                              []     3    Only limited self care, in bed greater than 50% of waking hours []     4    Completely disabled, no self care, confined to bed or chair []     5    Moribund   Past Medical History:  Diagnosis Date  . Acute anterior wall MI ([place]) 03/01/2018  . Acute combined systolic and diastolic heart failure ([place]) 03/15/2018  . Bronchitis, acute 05/04/2012  . Burn   .  Carotid stenosis, left followed by dr [name], cardiologist--- bilateral bruit per note   per dr [name] note [id number]  LICA 28-41% stenosis per duplex 05-14-2014  . Chronic diastolic CHF (congestive heart failure) ([place]) 09/21/2018  . Defect, retina, with detachment   . Dementia ([place])   . Diverticulosis   . DM (diabetes mellitus) type 2, uncontrolled, with ketoacidosis ([place]) 11/25/2013  . ED (erectile dysfunction)   . First degree heart block   . GERD (gastroesophageal reflux disease)   . Heart murmur   . History of blood transfusion    with open heart surgery  . Hypertension    cardiologsit-  dr [name]  . Hypertensive retinopathy    OU  . Iron deficiency anemia   . Mixed hyperlipidemia   . Morbid obesity ([place])   . Myocardial infarction ([place])    02/28/18  . OA (osteoarthritis)    right hip  . OSA on CPAP    followed  by dr dohmeier, uses CPAP  . PAF (paroxysmal atrial fibrillation) ([name])    a. on Eliquis  . Prostate cancer ([place])    dx 03-17-2017 (bx)  Stage T2a, Gleason 4+4, PSA  4.43, vol 32.32--  s/p radiatctive prostate seed implants 07-10-2017 then IMRT and ADT  . Retinal detachment    Rheg. RD OS  . RLS (restless legs syndrome)   . S/P aortic valve replacement with bioprosthetic valve 05/30/2018   23 mm Edwards Inspiris Resilia stented bovine pericardial tissue valve  . 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 S/P Maze operation for atrial fibrillation 05/30/2018   Complete bilateral atrial lesion set using bipolar radiofrequency and cryothermy ablation with clipping of LA appendage  . Scoliosis   . Severe aortic stenosis   . Trigger finger   . Type 2 diabetes mellitus ([place])   . Wears partial dentures    upper and lower    Past Surgical History:  Procedure Laterality Date  . AORTIC VALVE REPLACEMENT N/A 05/30/2018   Procedure: AORTIC VALVE REPLACEMENT (AVR) using Inspiris Valve, Size 23;  Surgeon: [name], MD;  Location: [facility];  Service: Open Heart  Surgery;  Laterality: N/A;  . APPENDECTOMY  1988  . BILATERAL TOTAL ETHMOIDECTOMY AND SPHENOIDECTOMY  01-14-2009    dr bates   [facility]  . CARDIOVERSION N/A 08/10/2018   Procedure: CARDIOVERSION;  Surgeon: Adrian Prows, MD;  Location: [facility];  Service: Cardiovascular;  Laterality: N/A;  . CARPAL TUNNEL RELEASE Bilateral 2013  . CATARACT EXTRACTION Bilateral    Dr. [name]  . CATARACT EXTRACTION W/ INTRAOCULAR LENS  IMPLANT, BILATERAL  date?  . CORONARY ARTERY BYPASS GRAFT N/A 05/30/2018   Procedure: CORONARY ARTERY BYPASS GRAFTING (CABG) x one using left internal mammary artery;  Surgeon: [name], MD;  Location: [place];  Service: Open Heart Surgery;  Laterality: N/A;  . CORONARY BALLOON ANGIOPLASTY N/A 03/01/2018   Procedure: CORONARY BALLOON ANGIOPLASTY;  Surgeon: Adrian Prows, MD;  Location: [place] [facility] [facility];  Service: Cardiovascular;  Laterality: N/A;  . CORONARY/GRAFT ACUTE MI REVASCULARIZATION N/A 03/01/2018   Procedure: Coronary/Graft Acute MI Revascularization;  Surgeon: Adrian Prows, MD;  Location: [place] [facility];  Service: Cardiovascular;  Laterality: N/A;  . CYSTOSCOPY  07/19/2017   Procedure: CYSTOSCOPY;  Surgeon: Nickie Retort, MD;  Location: [facility];  Service: Urology;;  No seeds found in [place]helps [place]  . EYE SURGERY Bilateral    Cat Sx OU.  Dislocated IOL sx OD.  Rheg. RD repair OS.  . EYE SURGERY    . GAS/FLUID EXCHANGE Right 06/13/2019   Procedure: GAS/FLUID EXCHANGE;  Surgeon: [name], MD;  Location: [place];  Service: Ophthalmology;  Laterality: Right;  . LAMINECTOMY WITH POSTERIOR LATERAL ARTHRODESIS LEVEL 2 N/A 02/12/2014   Procedure: LUMBAR TWO-THREE,LUIMBAR THREE-FOUR LAMINECTOMY/FORAMINOTOMY;POSSIBLE POSTEROLATERAL ARTHRODESIS WITH AUTOGRAFT;  Surgeon: [name], MD;  Location: [facility];  Service: Neurosurgery;  Laterality: N/A;  . LEFT HEART CATH AND CORONARY ANGIOGRAPHY N/A 03/01/2018   Procedure: LEFT HEART CATH AND CORONARY  ANGIOGRAPHY;  Surgeon: Adrian Prows, MD;  Location: [place] [facility];  Service: Cardiovascular;  Laterality: N/A;  . MAZE N/A 05/30/2018   Procedure: MAZE;  Surgeon: [name], MD;  Location: [place];  Service: Open Heart Surgery;  Laterality: N/A;  . ORIF RIGHT ANKLE FX'S  05/05/2001   retained hardware  . PARS PLANA VITRECTOMY Right 06/13/2019   Procedure: PARS PLANA VITRECTOMY  WITH 25 GAUGE WITH LASER AND GAS;  Surgeon: [name], MD;  Location: [facility];  Service: Ophthalmology;  Laterality: Right;  . PARS PLANA VITRECTOMY Right 09/12/2019   Procedure: [place] Vitrectomy With 25 Gauge;  Surgeon: [name], MD;  Location: [place];  Service: Ophthalmology;  Laterality: Right;  . [place] INJECTION Right 06/13/2019   Procedure: PERFLUORONE INJECTION;  Surgeon: [name], MD;  Location: [place];  Service: Ophthalmology;  Laterality: Right;  . PHOTOCOAGULATION WITH LASER Right 06/13/2019   Procedure: PHOTOCOAGULATION WITH LASER;  Surgeon: [name], MD;  Location: [place];  Service: Ophthalmology;  Laterality: Right;  . PLACEMENT AND SUTURE OF SECONDARY INTRAOCULAR LENS Right 09/12/2019   Procedure: PLACEMENT AND SUTURE OF SECONDARY INTRAOCULAR LENS;  Surgeon: [name], MD;  Location: [facility];  Service: Ophthalmology;  Laterality: Right;  . PROSTATE BIOPSY  03-17-2017   dr Pilar Jarvis office  . RADIOACTIVE SEED IMPLANT N/A 07/19/2017   Procedure: RADIOACTIVE SEED IMPLANT/BRACHYTHERAPY IMPLANT;  Surgeon: Nickie Retort, MD;  Location: [facility];  Service: Urology;  Laterality: N/A;  69 seeds implanted  . REMOVAL RETAINED LENS Right 09/12/2019   Procedure: REMOVAL RETAINED LENS;  Surgeon: [name], MD;  Location: [place];  Service: Ophthalmology;  Laterality: Right;  . RETINAL DETACHMENT SURGERY Right 06/13/2019   Dr. Coralyn Pear  . SKIN GRAFT     face  . SPACE OAR INSTILLATION N/A 07/19/2017   Procedure: SPACE OAR INSTILLATION;  Surgeon: Nickie Retort,  MD;  [facility]  Service: Urology;  Laterality: N/A;  . TEE WITHOUT CARDIOVERSION  03/20/2012   Procedure: TRANSESOPHAGEAL ECHOCARDIOGRAM (TEE);  Surgeon: Laverda Page, MD;  Location: [facility];  Service: Cardiovascular;  Laterality: N/A;  normal LV; normal EF; normal RV; normal LA w/ left atrial appendage very small, normal function, interatrial septum intact without defect; normal RA; trace MR,TR, & PI; mild AV calcification and senile degeneration w/ mild stenosis, AVA 1.7cm^2;;   . TEE WITHOUT CARDIOVERSION N/A 05/30/2018   Procedure: TRANSESOPHAGEAL ECHOCARDIOGRAM (TEE);  Surgeon: [name], MD;  Location: [place];  Service: Open Heart Surgery;  Laterality: N/A;  . TOTAL HIP ARTHROPLASTY Right 10/23/2017   Procedure: TOTAL HIP ARTHROPLASTY ANTERIOR APPROACH;  Surgeon: Frederik Pear, MD;  Location: [facility];  Service: Orthopedics;  Laterality: Right;  . TRANSTHORACIC ECHOCARDIOGRAM  01-11-2017   dr [name]  (per echo note, no significant change in seveity of AS, no other diagnostic change)   moderate concentric LVH, ef 76%, grade 1 diastolic dysfunction/  moderate LAE/  mild , grade 1 AR w/ moderate AV calcification, mild to moderate restricted AV leaflets w/ moderate AS, AVA 1.16cm^2, peak grandient 77mmHg, mean grandient 18mmHg/  trace MR, mild calcification MV annulus , mild MV leaflet calcification, mild MVS, peak grandient 4.35mmHg, mean grandient 2.42mmHg/ trace TR    Family History  Problem Relation Age of Onset  . Stroke Mother   . Hypertension Mother   . Heart attack Father   . Heart murmur Father   . Hypertension Sister   . Lung disease Brother   . Colon cancer Neg Hx   . Stomach cancer Neg Hx   . Rectal cancer Neg Hx   . Pancreatic cancer Neg Hx      Social History   Tobacco Use  Smoking Status Former Smoker  . Packs/day: 0.25  . Years: 1.00  . Pack years: 0.25  . Types: Cigarettes  . Quit date: 02/04/1967  . Years  since quitting: 53.4   Smokeless Tobacco Never Used  Tobacco Comment   smoked 1 q2-3 days    Social History   Substance and Sexual Activity  Alcohol Use Yes   Comment: occasional beer     No Known Allergies  Current Outpatient Medications  Medication Sig Dispense Refill  . atorvastatin (LIPITOR) 80 MG tablet TAKE 1 TABLET BY MOUTH  DAILY 90 tablet 3  . colesevelam (WELCHOL) 625 MG tablet Take 625 mg by mouth 3 (three) times daily.     Marland Kitchen donepezil (ARICEPT) 10 MG tablet Take 10 mg by mouth every morning.     . dorzolamide-timolol (COSOPT) 22.3-6.8 MG/ML ophthalmic solution Place 1 drop into the right eye 2 (two) times daily. 10 mL 1  . esomeprazole (NEXIUM) 40 MG capsule Take 40 mg by mouth daily.     . ferrous sulfate 325 (65 FE) MG tablet Take 1 tablet (325 mg total) by mouth daily with breakfast. 90 tablet 3  . gabapentin (NEURONTIN) 300 MG capsule     . insulin glargine (LANTUS) 100 UNIT/ML injection Inject 20-40 Units into the skin at bedtime as needed (BGL below 140-150 take 20 units, if 151 or higher take 40 units).    . memantine (NAMENDA) 5 MG tablet Take 5 mg by mouth 2 (two) times daily.     . metoprolol tartrate (LOPRESSOR) 25 MG tablet TAKE 1 TABLET BY MOUTH  DAILY 90 tablet 3  . mirabegron ER (MYRBETRIQ) 50 MG TB24 tablet Take 50 mg by mouth at bedtime.     Marland Kitchen omega-3 acid ethyl esters (LOVAZA) 1 G capsule Take 2 g by mouth daily.    Marland Kitchen rOPINIRole (REQUIP) 2 MG tablet Take 2 mg by mouth 3 (three) times daily as needed (RLS).     Marland Kitchen spironolactone (ALDACTONE) 25 MG tablet Take 0.5 tablets (12.5 mg total) by mouth daily. 45 tablet 3  . tamsulosin (FLOMAX) 0.4 MG CAPS capsule Take 0.4 mg by mouth daily after supper.     . torsemide (DEMADEX) 20 MG tablet Take 1 tablet (20 mg total) by mouth 2 (two) times daily as needed (Leg swelling). 90 tablet 3  . valsartan-hydrochlorothiazide (DIOVAN-HCT) 80-12.5 MG tablet TAKE 1 TABLET BY MOUTH IN  THE MORNING 90 tablet 3  . vitamin B-12 (CYANOCOBALAMIN) 1000  MCG tablet Take 1,000 mcg by mouth 2 (two) times daily.    Alveda Reasons 15 MG TABS tablet TAKE 1 TABLET BY MOUTH IN  THE EVENING AFTER DINNER 90 tablet 3   Current Facility-Administered Medications  Medication Dose Route Frequency Provider Last Rate Last Admin  . dextrose 5 % solution   Intravenous Once Doran Stabler, MD        Review of Systems  Constitutional: Negative.   Respiratory: Negative.   Cardiovascular: Negative.   Musculoskeletal: Positive for joint pain and myalgias.  Neurological: Negative.      PHYSICAL EXAMINATION: BP (!) 109/57   Pulse (!) 59   Resp 20   Ht 5\' 6"  (1.676 m)   Wt 210 lb (95.3 kg)   SpO2 96% Comment: RA  BMI 33.89 kg/m  Physical Exam Constitutional:      General: He is not in acute distress.    Appearance: Normal appearance. He is normal weight. He is not ill-appearing or toxic-appearing.     Comments: Walks with a cane  Eyes:     Extraocular Movements: Extraocular movements intact.  Cardiovascular:     Rate and Rhythm: Normal rate  and regular rhythm.     Pulses: Normal pulses.     Heart sounds: Murmur heard.      Comments: Mild systolic murmur Pulmonary:     Effort: Pulmonary effort is normal. No respiratory distress.     Breath sounds: Normal breath sounds.  Abdominal:     General: Abdomen is flat. There is no distension.  Musculoskeletal:        General: Normal range of motion.     Cervical back: Normal range of motion. No rigidity.  Neurological:     General: No focal deficit present.     Mental Status: He is alert and oriented to person, place, and time.     Diagnostic Studies & Laboratory data:     Recent Radiology Findings:   CT CHEST WO CONTRAST  Result Date: 06/12/2020 CLINICAL DATA:  Follow-up pulmonary nodule EXAM: CT CHEST WITHOUT CONTRAST TECHNIQUE: Multidetector CT imaging of the chest was performed following the standard protocol without IV contrast. COMPARISON:  CT angiogram chest abdomen pelvis, 03/17/2018  FINDINGS: Cardiovascular: Status post aortic valve replacement. Scattered aortic atherosclerosis. Normal heart size. Dense mitral annulus calcifications. Left atrial appendage clip. Three-vessel coronary artery calcifications and/or stents. No pericardial effusion. Mediastinum/Nodes: No enlarged mediastinal, hilar, or axillary lymph nodes. Small hiatal hernia. Thyroid gland, trachea, and esophagus demonstrate no significant findings. Lungs/Pleura: Scattered ground-glass opacity of the dependent left lung base, generally nonspecific and likely scarring and/or partial atelectasis. There is however a rounded ground-glass opacity of the central left lower lobe measuring 2.8 x 2.5 cm with a solid central nodule measuring 4 mm (series 3, image 83). No pleural effusion or pneumothorax. Upper Abdomen: No acute abnormality. Musculoskeletal: No chest wall mass or suspicious bone lesions identified. IMPRESSION: 1. There is a rounded ground-glass opacity of the central left lower lobe measuring 2.8 x 2.5 cm with a solid central nodule measuring 4 mm. This is concerning for adenocarcinoma. Comparison to prior examination dated 03/17/2018 is not possible due to the presence of pleural effusions and atelectasis at that time. Given small size, PET-CT is likely of limited utility to assess for metabolic activity. Recommend initial follow-up examination at 3-6 months to assess for persistence. If unchanged, and solid component remains <6 mm, annual CT is recommended until 5 years of stability has been established. If persistent these nodules should be considered highly suspicious if the solid component of the nodule is 6 mm or greater in size and enlarging. This recommendation follows the consensus statement: Guidelines for Management of Incidental Pulmonary Nodules Detected on CT Images: From the Fleischner Society 2017; Radiology 2017; 284:228-243. 2. Scattered ground-glass opacity of the dependent left lung base, generally  nonspecific and likely scarring and/or partial atelectasis. 3. Coronary artery disease. 4. Status post aortic valve replacement. 5. Aortic Atherosclerosis (ICD10-I70.0). Electronically Signed   By: Eddie Candle M.D.   On: 06/12/2020 09:25       I have independently reviewed the above radiology studies  and reviewed the findings with the patient.   Recent Lab Findings: Lab Results  Component Value Date   WBC 5.3 07/02/2020   HGB 9.5 (L) 07/02/2020   HCT 29.2 (L) 07/02/2020   PLT 153 07/02/2020   GLUCOSE 134 (H) 09/12/2019   CHOL 151 03/01/2018   TRIG 69 03/01/2018   HDL 55 03/01/2018   LDLCALC 82 03/01/2018   ALT 23 05/28/2018   AST 18 05/28/2018   NA 137 09/12/2019   K 3.7 09/12/2019   CL 106 09/12/2019  CREATININE 1.38 (H) 09/12/2019   BUN 20 09/12/2019   CO2 22 09/12/2019   INR 1.0 09/12/2019   HGBA1C 5.8 (H) 05/28/2018     PFTs: Pending  Problem List: Left lower lobe pulmonary nodule.  2.8 cm groundglass opacity with a 4 mm solid component. History of aortic valve replacement with CABG x1 in 2019.  23 mm Inspiris valve History of atrial fibrillation on Xarelto  Assessment / Plan:   79 year old male who presents with a groundglass opacity in a very small component that is solid concerning for primary bronchogenic carcinoma.  I personally reviewed his imaging with him and his family explained that even though he has a groundglass opacity that is approximately 3 cm, given that the solid component is only 4 mm it would be very difficult to diagnose this as a primary lung cancer.  I further explained that a PET/CT likely would be indeterminate, and that a needle guided biopsy would also reveal negative results.  The only possible way to obtain a tissue sample would be to perform a generous wedge resection, or lobectomy.  Given his risk factors I do not think that this would be a recommended approach.  My plan is to repeat the imaging again in 3 months to evaluate for any  substantial changes, and also refer him to Dr. Valeta Harms to establish a relationship given the likelihood that he will require a navigational bronchoscopy if this nodule increases in size.  The family is agreeable with that plan, and the appropriate referrals have been made.     I  spent 40 minutes with  the patient face to face and greater then 50% of the time was spent in counseling and coordination of care.    Lajuana Matte 07/10/2020 2:12 PM

## 2020-07-16 ENCOUNTER — Encounter: Payer: Self-pay | Admitting: Pulmonary Disease

## 2020-07-16 ENCOUNTER — Other Ambulatory Visit: Payer: Self-pay

## 2020-07-16 ENCOUNTER — Ambulatory Visit (INDEPENDENT_AMBULATORY_CARE_PROVIDER_SITE_OTHER): Payer: Medicare Other | Admitting: Pulmonary Disease

## 2020-07-16 VITALS — BP 160/70 | HR 63 | Temp 97.8°F | Ht 68.0 in | Wt 217.1 lb

## 2020-07-16 DIAGNOSIS — Z87891 Personal history of nicotine dependence: Secondary | ICD-10-CM

## 2020-07-16 DIAGNOSIS — Z953 Presence of xenogenic heart valve: Secondary | ICD-10-CM

## 2020-07-16 DIAGNOSIS — Z951 Presence of aortocoronary bypass graft: Secondary | ICD-10-CM | POA: Diagnosis not present

## 2020-07-16 DIAGNOSIS — R918 Other nonspecific abnormal finding of lung field: Secondary | ICD-10-CM | POA: Diagnosis not present

## 2020-07-16 DIAGNOSIS — I5032 Chronic diastolic (congestive) heart failure: Secondary | ICD-10-CM

## 2020-07-16 DIAGNOSIS — J984 Other disorders of lung: Secondary | ICD-10-CM

## 2020-07-16 DIAGNOSIS — F17290 Nicotine dependence, other tobacco product, uncomplicated: Secondary | ICD-10-CM

## 2020-07-16 DIAGNOSIS — R911 Solitary pulmonary nodule: Secondary | ICD-10-CM

## 2020-07-16 DIAGNOSIS — Z8679 Personal history of other diseases of the circulatory system: Secondary | ICD-10-CM

## 2020-07-16 DIAGNOSIS — Z9889 Other specified postprocedural states: Secondary | ICD-10-CM

## 2020-07-16 DIAGNOSIS — I10 Essential (primary) hypertension: Secondary | ICD-10-CM

## 2020-07-16 NOTE — Patient Instructions (Signed)
Thank you for visiting Dr. Valeta Harms at [facility]. Today we recommend the following:  Orders Placed This Encounter  Procedures  . CT Super D Chest Wo Contrast   CT Scan scheduled prior to office appointment.   Return in about 2 months (around 09/14/2020) for w/ Dr. Valeta Harms .     Please do your part to reduce the spread of COVID-19.

## 2020-07-16 NOTE — Progress Notes (Signed)
Synopsis: Referred in December 2021 for groundglass opacity by Lajuana Matte, MD  Subjective:   PATIENT ID: Cody Phelps GENDER: male DOB: 02-28-1941, MRN: [id number]  Chief Complaint  Patient presents with  . Consult    Lung nodule    This is a 79 year old gentleman with a past medical history of MI, chronic systolic and diastolic heart failure, prior CABG plus valve in 2019 by Dr. Roxy Manns.  Hypertension, GERD, diabetes.  Patient had a recent CT scan of the chest on 06/11/2020 which revealed a 2.8 x 2.5 groundglass opacity within the left lower lobe concerning for a potential underlying primary lung cancer such as a lipidic adenocarcinoma.  Patient was seen by cardiothoracic surgery, Dr. Kipp Brood and referred to [place] for image follow-up and consideration for bronchoscopy if needed.  Patient denies weight loss, shortness of breath neurologic symptoms or hemoptysis.  He still occasionally smokes a pipe.     Past Medical History:  Diagnosis Date  . Acute anterior wall MI ([place]) 03/01/2018  . Acute combined systolic and diastolic heart failure ([place]) 03/15/2018  . Bronchitis, acute 05/04/2012  . Burn   . Carotid stenosis, left followed by dr [name], cardiologist--- bilateral bruit per note   per dr [name] note 51/7001  LICA 74-94% stenosis per duplex 05-14-2014  . Chronic diastolic CHF (congestive heart failure) ([place]) 09/21/2018  . Defect, retina, with detachment   . Dementia ([place])   . Diverticulosis   . DM (diabetes mellitus) type 2, uncontrolled, with ketoacidosis ([place]) 11/25/2013  . ED (erectile dysfunction)   . First degree heart block   . GERD (gastroesophageal reflux disease)   . Heart murmur   . History of blood transfusion    with open heart surgery  . Hypertension    cardiologsit-  dr [name]  . Hypertensive retinopathy    OU  . Iron deficiency anemia   . Mixed hyperlipidemia   . Morbid obesity ([place])   . Myocardial infarction ([place])    02/28/18  . OA (osteoarthritis)     right hip  . OSA on CPAP    followed by dr dohmeier, uses CPAP  . PAF (paroxysmal atrial fibrillation) ([name])    a. on Eliquis  . Prostate cancer ([place])    dx 03-17-2017 (bx)  Stage T2a, Gleason 4+4, PSA  4.43, vol 32.32--  s/p radiatctive prostate seed implants 07-10-2017 then IMRT and ADT  . Retinal detachment    Rheg. RD OS  . RLS (restless legs syndrome)   . S/P aortic valve replacement with bioprosthetic valve 05/30/2018   23 mm Edwards Inspiris Resilia stented bovine pericardial tissue valve  . 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 S/P Maze operation for atrial fibrillation 05/30/2018   Complete bilateral atrial lesion set using bipolar radiofrequency and cryothermy ablation with clipping of LA appendage  . Scoliosis   . Severe aortic stenosis   . Trigger finger   . Type 2 diabetes mellitus ([place])   . Wears partial dentures    upper and lower     Family History  Problem Relation Age of Onset  . Stroke Mother   . Hypertension Mother   . Heart attack Father   . Heart murmur Father   . Hypertension Sister   . Lung disease Brother   . Colon cancer Neg Hx   . Stomach cancer Neg Hx   . Rectal cancer Neg Hx   . Pancreatic cancer Neg Hx  Past Surgical History:  Procedure Laterality Date  . AORTIC VALVE REPLACEMENT N/A 05/30/2018   Procedure: AORTIC VALVE REPLACEMENT (AVR) using Inspiris Valve, Size 23;  Surgeon: [name], MD;  Location: [place];  Service: Open Heart Surgery;  Laterality: N/A;  . APPENDECTOMY  1988  . BILATERAL TOTAL ETHMOIDECTOMY AND SPHENOIDECTOMY  01-14-2009    dr bates   [facility]  . CARDIOVERSION N/A 08/10/2018   Procedure: CARDIOVERSION;  Surgeon: Adrian Prows, MD;  Location: [place];  Service: Cardiovascular;  Laterality: N/A;  . CARPAL TUNNEL RELEASE Bilateral 2013  . CATARACT EXTRACTION Bilateral    Dr. [name]  . CATARACT EXTRACTION W/ INTRAOCULAR LENS  IMPLANT, BILATERAL  date?  . CORONARY ARTERY BYPASS GRAFT N/A 05/30/2018    Procedure: CORONARY ARTERY BYPASS GRAFTING (CABG) x one using left internal mammary artery;  Surgeon: [name], MD;  Location: [place];  Service: Open Heart Surgery;  Laterality: N/A;  . CORONARY BALLOON ANGIOPLASTY N/A 03/01/2018   Procedure: CORONARY BALLOON ANGIOPLASTY;  Surgeon: Adrian Prows, MD;  Location: [place] [facility];  Service: Cardiovascular;  Laterality: N/A;  . CORONARY/GRAFT ACUTE MI REVASCULARIZATION N/A 03/01/2018   Procedure: Coronary/Graft Acute MI Revascularization;  Surgeon: Adrian Prows, MD;  Location: [place] [facility];  Service: Cardiovascular;  Laterality: N/A;  . CYSTOSCOPY  07/19/2017   Procedure: CYSTOSCOPY;  Surgeon: Nickie Retort, MD;  Location: [facility];  Service: Urology;;  No seeds found in [place]  . EYE SURGERY Bilateral    Cat Sx OU.  Dislocated IOL sx OD.  Rheg. RD repair OS.  . EYE SURGERY    . GAS/FLUID EXCHANGE Right 06/13/2019   Procedure: GAS/FLUID EXCHANGE;  Surgeon: [name], MD;  Location: [place];  Service: Ophthalmology;  Laterality: Right;  . LAMINECTOMY WITH POSTERIOR LATERAL ARTHRODESIS LEVEL 2 N/A 02/12/2014   Procedure: LUMBAR TWO-THREE,LUIMBAR THREE-FOUR LAMINECTOMY/FORAMINOTOMY;POSSIBLE POSTEROLATERAL ARTHRODESIS WITH AUTOGRAFT;  Surgeon: [name], MD;  Location: [facility];  Service: Neurosurgery;  Laterality: N/A;  . LEFT HEART CATH AND CORONARY ANGIOGRAPHY N/A 03/01/2018   Procedure: LEFT HEART CATH AND CORONARY ANGIOGRAPHY;  Surgeon: Adrian Prows, MD;  Location: [place] [facility];  Service: Cardiovascular;  Laterality: N/A;  . MAZE N/A 05/30/2018   Procedure: MAZE;  Surgeon: [name], MD;  Location: [place];  Service: Open Heart Surgery;  Laterality: N/A;  . ORIF RIGHT ANKLE FX'S  05/05/2001   retained hardware  . PARS PLANA VITRECTOMY Right 06/13/2019   Procedure: PARS PLANA VITRECTOMY WITH 25 GAUGE WITH LASER AND GAS;  Surgeon: [name], MD;  Location: [place];  Service: Ophthalmology;   Laterality: Right;  . PARS PLANA VITRECTOMY Right 09/12/2019   Procedure: [place] Vitrectomy With 25 Gauge;  Surgeon: [name], MD;  Location: [place];  Service: Ophthalmology;  Laterality: Right;  . [place] INJECTION Right 06/13/2019   Procedure: PERFLUORONE INJECTION;  Surgeon: [name], MD;  Location: [place];  Service: Ophthalmology;  Laterality: Right;  . PHOTOCOAGULATION WITH LASER Right 06/13/2019   Procedure: PHOTOCOAGULATION WITH LASER;  Surgeon: [name], MD;  Location: [place];  Service: Ophthalmology;  Laterality: Right;  . PLACEMENT AND SUTURE OF SECONDARY INTRAOCULAR LENS Right 09/12/2019   Procedure: PLACEMENT AND SUTURE OF SECONDARY INTRAOCULAR LENS;  Surgeon: [name], MD;  Location: [place];  Service: Ophthalmology;  Laterality: Right;  . PROSTATE BIOPSY  03-17-2017   dr Pilar Jarvis office  . RADIOACTIVE SEED IMPLANT N/A 07/19/2017   Procedure: RADIOACTIVE SEED  IMPLANT/BRACHYTHERAPY IMPLANT;  Surgeon: Nickie Retort, MD;  Location: [facility];  Service: Urology;  Laterality: N/A;  69 seeds implanted  . REMOVAL RETAINED LENS Right 09/12/2019   Procedure: REMOVAL RETAINED LENS;  Surgeon: [name], MD;  Location: [place];  Service: Ophthalmology;  Laterality: Right;  . RETINAL DETACHMENT SURGERY Right 06/13/2019   Dr. Coralyn Pear  . SKIN GRAFT     face  . SPACE OAR INSTILLATION N/A 07/19/2017   Procedure: SPACE OAR INSTILLATION;  Surgeon: Nickie Retort, MD;  [facility];  Service: Urology;  Laterality: N/A;  . TEE WITHOUT CARDIOVERSION  03/20/2012   Procedure: TRANSESOPHAGEAL ECHOCARDIOGRAM (TEE);  Surgeon: Laverda Page, MD;  Location: [facility];  Service: Cardiovascular;  Laterality: N/A;  normal LV; normal EF; normal RV; normal LA w/ left atrial appendage very small, normal function, interatrial septum intact without defect; normal RA; trace MR,TR, & PI; mild AV calcification and senile degeneration w/ mild  stenosis, AVA 1.7cm^2;;   . TEE WITHOUT CARDIOVERSION N/A 05/30/2018   Procedure: TRANSESOPHAGEAL ECHOCARDIOGRAM (TEE);  Surgeon: [name], MD;  Location: [place];  Service: Open Heart Surgery;  Laterality: N/A;  . TOTAL HIP ARTHROPLASTY Right 10/23/2017   Procedure: TOTAL HIP ARTHROPLASTY ANTERIOR APPROACH;  Surgeon: Frederik Pear, MD;  Location: [place];  Service: Orthopedics;  Laterality: Right;  . TRANSTHORACIC ECHOCARDIOGRAM  01-11-2017   dr [name]  (per echo note, no significant change in seveity of AS, no other diagnostic change)   moderate concentric LVH, ef 38%, grade 1 diastolic dysfunction/  moderate LAE/  mild , grade 1 AR w/ moderate AV calcification, mild to moderate restricted AV leaflets w/ moderate AS, AVA 1.16cm^2, peak grandient 43mmHg, mean grandient 10mmHg/  trace MR, mild calcification MV annulus , mild MV leaflet calcification, mild MVS, peak grandient 4.39mmHg, mean grandient 2.66mmHg/ trace TR    Social History   Socioeconomic History  . Marital status: Married    Spouse name: Butch Penny  . Number of children: 2  . Years of education: 28  . Highest education level: Not on file  Occupational History  . Not on file  Tobacco Use  . Smoking status: Former Smoker    Packs/day: 0.25    Years: 1.00    Pack years: 0.25    Types: Cigarettes    Quit date: 02/04/1967    Years since quitting: 53.4  . Smokeless tobacco: Never Used  . Tobacco comment: smoked 1 q2-3 days  Vaping Use  . Vaping Use: Never used  Substance and Sexual Activity  . Alcohol use: Yes    Comment: occasional beer  . Drug use: No  . Sexual activity: Yes  Other Topics Concern  . Not on file  Social History Narrative   Patient is married Butch Penny).   Patient has two children.   Patient is retired.   Patient has a college education.   Patient is right-handed.   Patient drinks two cups of coffee per day and 2-3 cups of Diet soda daily and limited tea.         Social Determinants of Health    Financial Resource Strain: Not on file  Food Insecurity: Not on file  Transportation Needs: Not on file  Physical Activity: Not on file  Stress: Not on file  Social Connections: Not on file  Intimate Partner Violence: Not on file     No Known Allergies   Outpatient Medications Prior to Visit  Medication Sig Dispense Refill  .  atorvastatin (LIPITOR) 80 MG tablet TAKE 1 TABLET BY MOUTH  DAILY 90 tablet 3  . colesevelam (WELCHOL) 625 MG tablet Take 625 mg by mouth 3 (three) times daily.     Marland Kitchen donepezil (ARICEPT) 10 MG tablet Take 10 mg by mouth every morning.     . dorzolamide-timolol (COSOPT) 22.3-6.8 MG/ML ophthalmic solution Place 1 drop into the right eye 2 (two) times daily. 10 mL 1  . esomeprazole (NEXIUM) 40 MG capsule Take 40 mg by mouth daily.     . ferrous sulfate 325 (65 FE) MG tablet Take 1 tablet (325 mg total) by mouth daily with breakfast. 90 tablet 3  . gabapentin (NEURONTIN) 300 MG capsule     . insulin glargine (LANTUS) 100 UNIT/ML injection Inject 20-40 Units into the skin at bedtime as needed (BGL below 140-150 take 20 units, if 151 or higher take 40 units).    . memantine (NAMENDA) 5 MG tablet Take 5 mg by mouth 2 (two) times daily.     . metoprolol tartrate (LOPRESSOR) 25 MG tablet TAKE 1 TABLET BY MOUTH  DAILY 90 tablet 3  . mirabegron ER (MYRBETRIQ) 50 MG TB24 tablet Take 50 mg by mouth at bedtime.     Marland Kitchen omega-3 acid ethyl esters (LOVAZA) 1 G capsule Take 2 g by mouth daily.    Marland Kitchen rOPINIRole (REQUIP) 2 MG tablet Take 2 mg by mouth 3 (three) times daily as needed (RLS).     Marland Kitchen spironolactone (ALDACTONE) 25 MG tablet Take 0.5 tablets (12.5 mg total) by mouth daily. 45 tablet 3  . tamsulosin (FLOMAX) 0.4 MG CAPS capsule Take 0.4 mg by mouth daily after supper.     . torsemide (DEMADEX) 20 MG tablet Take 1 tablet (20 mg total) by mouth 2 (two) times daily as needed (Leg swelling). 90 tablet 3  . valsartan-hydrochlorothiazide (DIOVAN-HCT) 80-12.5 MG tablet TAKE 1 TABLET  BY MOUTH IN  THE MORNING 90 tablet 3  . vitamin B-12 (CYANOCOBALAMIN) 1000 MCG tablet Take 1,000 mcg by mouth 2 (two) times daily.    Alveda Reasons 15 MG TABS tablet TAKE 1 TABLET BY MOUTH IN  THE EVENING AFTER DINNER 90 tablet 3   Facility-Administered Medications Prior to Visit  Medication Dose Route Frequency Provider Last Rate Last Admin  . dextrose 5 % solution   Intravenous Once Doran Stabler, MD        Review of Systems  Constitutional: Negative for chills, fever, malaise/fatigue and weight loss.  HENT: Negative for hearing loss, sore throat and tinnitus.   Eyes: Negative for blurred vision and double vision.  Respiratory: Negative for cough, hemoptysis, sputum production, shortness of breath, wheezing and stridor.   Cardiovascular: Negative for chest pain, palpitations, orthopnea, leg swelling and PND.  Gastrointestinal: Negative for abdominal pain, constipation, diarrhea, heartburn, nausea and vomiting.  Genitourinary: Negative for dysuria, hematuria and urgency.  Musculoskeletal: Negative for joint pain and myalgias.  Skin: Negative for itching and rash.  Neurological: Negative for dizziness, tingling, weakness and headaches.  Endo/Heme/Allergies: Negative for environmental allergies. Does not bruise/bleed easily.  Psychiatric/Behavioral: Negative for depression. The patient is not nervous/anxious and does not have insomnia.   All other systems reviewed and are negative.    Objective:  Physical Exam   Vitals:   07/16/20 0931  BP: (!) 160/70  Pulse: 63  Temp: 97.8 F (36.6 C)  TempSrc: Tympanic  SpO2: 98%  Weight: 217 lb 2 oz (98.5 kg)  Height: 5\' 8"  (1.727 m)  98% on RA BMI Readings from Last 3 Encounters:  07/16/20 33.01 kg/m  07/10/20 33.89 kg/m  07/02/20 34.88 kg/m   Wt Readings from Last 3 Encounters:  07/16/20 217 lb 2 oz (98.5 kg)  07/10/20 210 lb (95.3 kg)  07/02/20 216 lb 1.6 oz (98 kg)     CBC    Component Value Date/Time   WBC 5.3  07/02/2020 0934   WBC 5.6 09/12/2019 0956   RBC 3.29 (L) 07/02/2020 0934   HGB 9.5 (L) 07/02/2020 0934   HCT 29.2 (L) 07/02/2020 0934   PLT 153 07/02/2020 0934   MCV 88.8 07/02/2020 0934   MCH 28.9 07/02/2020 0934   MCHC 32.5 07/02/2020 0934   RDW 13.3 07/02/2020 0934   LYMPHSABS 0.9 07/02/2020 0934   MONOABS 0.5 07/02/2020 0934   EOSABS 0.3 07/02/2020 0934   BASOSABS 0.1 07/02/2020 0934     Chest Imaging:  Pulmonary Functions Testing Results: PFT Results Latest Ref Rng & Units 03/19/2018  FVC-Pre L 1.73  FVC-Predicted Pre % 42  FVC-Post L 1.65  FVC-Predicted Post % 40  Pre FEV1/FVC % % 80  Post FEV1/FCV % % 87  FEV1-Pre L 1.38  FEV1-Predicted Pre % 47  FEV1-Post L 1.43  DLCO uncorrected ml/min/mmHg 12.22  DLCO UNC% % 39  DLCO corrected ml/min/mmHg 15.93  DLCO COR %Predicted % 51  DLVA Predicted % 102  TLC L 4.09  TLC % Predicted % 59  RV % Predicted % 90    FeNO:  Pathology:   Echocardiogram:   Heart Catheterization:     Assessment & Plan:     ICD-10-CM   1. Ground glass opacity present on imaging of lung  R91.8 CT Super D Chest Wo Contrast  2. Nodule of lower lobe of lung  R91.1   3. S/P aortic valve replacement with bioprosthetic valve  Z95.3   4. S/P CABG x 1  Z95.1   5. S/P Maze operation for atrial fibrillation  Z98.890    Z86.79   6. Chronic diastolic CHF (congestive heart failure) ([name])  I50.32   7. Primary hypertension  I10   8. Former smoker  Z87.891   43. Pipe smoker  F17.290   10. 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  Patient has been seen by thoracic surgery and I agree for tissue diagnosis would require a large wedge resection in a patient with significant cardiac comorbidities.  Additionally needle biopsy percutaneously is unlikely to reveal diagnosis.  Patient pulmonary  function tests 2019 this revealed normal ratio, reduced FVC, FEV1, reduced DLCO of 39%.  Restrictive defect present.  Case was discussed this morning with Dr. Kipp Brood in person.   Plan: Today in the office we discussed short-term interval follow-up to look for persistence of the groundglass opacity.  If persistent would recommend a navigational bronchoscopy which could include transbronchial biopsies which are likely to give [place] the best diagnostic yield as well as transbronchial brushings to be able to help diagnose this persistent GGO.  We discussed the risk benefits and alternatives of proceeding with this plan to include bleeding as well as pneumothorax.  Patient is on Xarelto and this would need to be stopped prior to any planned procedures.  Patient is agreeable with this plan. A super D noncontrasted CT of the chest has been ordered for 3 months.  Patient to follow-up with me after the CT scan has been complete.  Return approximately mid February.  2022.   Current Outpatient Medications:  .  atorvastatin (LIPITOR) 80 MG tablet, TAKE 1 TABLET BY MOUTH  DAILY, Disp: 90 tablet, Rfl: 3 .  colesevelam (WELCHOL) 625 MG tablet, Take 625 mg by mouth 3 (three) times daily. , Disp: , Rfl:  .  donepezil (ARICEPT) 10 MG tablet, Take 10 mg by mouth every morning. , Disp: , Rfl:  .  dorzolamide-timolol (COSOPT) 22.3-6.8 MG/ML ophthalmic solution, Place 1 drop into the right eye 2 (two) times daily., Disp: 10 mL, Rfl: 1 .  esomeprazole (NEXIUM) 40 MG capsule, Take 40 mg by mouth daily. , Disp: , Rfl:  .  ferrous sulfate 325 (65 FE) MG tablet, Take 1 tablet (325 mg total) by mouth daily with breakfast., Disp: 90 tablet, Rfl: 3 .  gabapentin (NEURONTIN) 300 MG capsule, , Disp: , Rfl:  .  insulin glargine (LANTUS) 100 UNIT/ML injection, Inject 20-40 Units into the skin at bedtime as needed (BGL below 140-150 take 20 units, if 151 or higher take 40 units)., Disp: , Rfl:  .  memantine (NAMENDA) 5 MG  tablet, Take 5 mg by mouth 2 (two) times daily. , Disp: , Rfl:  .  metoprolol tartrate (LOPRESSOR) 25 MG tablet, TAKE 1 TABLET BY MOUTH  DAILY, Disp: 90 tablet, Rfl: 3 .  mirabegron ER (MYRBETRIQ) 50 MG TB24 tablet, Take 50 mg by mouth at bedtime. , Disp: , Rfl:  .  omega-3 acid ethyl esters (LOVAZA) 1 G capsule, Take 2 g by mouth daily., Disp: , Rfl:  .  rOPINIRole (REQUIP) 2 MG tablet, Take 2 mg by mouth 3 (three) times daily as needed (RLS). , Disp: , Rfl:  .  spironolactone (ALDACTONE) 25 MG tablet, Take 0.5 tablets (12.5 mg total) by mouth daily., Disp: 45 tablet, Rfl: 3 .  tamsulosin (FLOMAX) 0.4 MG CAPS capsule, Take 0.4 mg by mouth daily after supper. , Disp: , Rfl:  .  torsemide (DEMADEX) 20 MG tablet, Take 1 tablet (20 mg total) by mouth 2 (two) times daily as needed (Leg swelling)., Disp: 90 tablet, Rfl: 3 .  valsartan-hydrochlorothiazide (DIOVAN-HCT) 80-12.5 MG tablet, TAKE 1 TABLET BY MOUTH IN  THE MORNING, Disp: 90 tablet, Rfl: 3 .  vitamin B-12 (CYANOCOBALAMIN) 1000 MCG tablet, Take 1,000 mcg by mouth 2 (two) times daily., Disp: , Rfl:  .  XARELTO 15 MG TABS tablet, TAKE 1 TABLET BY MOUTH IN  THE EVENING AFTER DINNER, Disp: 90 tablet, Rfl: 3  Current Facility-Administered Medications:  .  dextrose 5 % solution, , Intravenous, Once, Danis, Estill Cotta III, MD  I spent 60 minutes dedicated to the care of this patient on the date of this encounter to include pre-visit review of records, face-to-face time with the patient discussing conditions above, post visit ordering of testing, clinical documentation with the electronic health record, making appropriate referrals as documented, and communicating necessary findings to members of the patients care team.   Garner Nash, DO [facility] 07/16/2020 9:47 AM

## 2020-07-25 ENCOUNTER — Other Ambulatory Visit: Payer: Self-pay | Admitting: Cardiology

## 2020-08-24 ENCOUNTER — Ambulatory Visit: Payer: Medicare Other | Admitting: Cardiology

## 2020-08-25 NOTE — Progress Notes (Signed)
[facility] Note  08/26/2020     CHIEF COMPLAINT Patient presents for Retina Follow Up   HISTORY OF PRESENT ILLNESS: Cody Phelps is a 80 y.o. male who presents to the clinic today for:  HPI    Retina Follow Up    Patient presents with  Other.  In both eyes.  Duration of 3 months.  Since onset it is stable.  I, the attending physician,  performed the HPI with the patient and updated documentation appropriately.          Comments    3 month follow up- doing well.  He had bifocals made since he was here and he is still trying to get used to them.   BS 139 last night A1C unsure but around 5 or 6       Last edited by [name], MD on 08/26/2020  5:01 PM. (History)      Referring physician: [name], MD [place],  [place] [address]  HISTORICAL INFORMATION:   Selected notes from the [facility] Referred by Dr. [name] for concern of RD OD   CURRENT MEDICATIONS: Current Outpatient Medications (Ophthalmic Drugs)  Medication Sig  . prednisoLONE acetate (PRED FORTE) 1 % ophthalmic suspension Place 1 drop into the right eye daily.  . dorzolamide-timolol (COSOPT) 22.3-6.8 MG/ML ophthalmic solution Place 1 drop into the right eye 2 (two) times daily. (Patient not taking: Reported on 08/26/2020)   No current facility-administered medications for this visit. (Ophthalmic Drugs)   Current Outpatient Medications (Other)  Medication Sig  . atorvastatin (LIPITOR) 80 MG tablet TAKE 1 TABLET BY MOUTH  DAILY  . colesevelam (WELCHOL) 625 MG tablet Take 625 mg by mouth 3 (three) times daily.   Marland Kitchen donepezil (ARICEPT) 10 MG tablet Take 10 mg by mouth every morning.   Marland Kitchen esomeprazole (NEXIUM) 40 MG capsule Take 40 mg by mouth daily.   . ferrous sulfate 325 (65 FE) MG tablet Take 1 tablet (325 mg total) by mouth daily with breakfast.  . gabapentin (NEURONTIN) 300 MG capsule   . insulin glargine (LANTUS) 100 UNIT/ML injection  Inject 20-40 Units into the skin at bedtime as needed (BGL below 140-150 take 20 units, if 151 or higher take 40 units).  . memantine (NAMENDA) 5 MG tablet Take 5 mg by mouth 2 (two) times daily.   . metoprolol tartrate (LOPRESSOR) 25 MG tablet TAKE 1 TABLET BY MOUTH  DAILY  . mirabegron ER (MYRBETRIQ) 50 MG TB24 tablet Take 50 mg by mouth at bedtime.   Marland Kitchen omega-3 acid ethyl esters (LOVAZA) 1 G capsule Take 2 g by mouth daily.  Marland Kitchen rOPINIRole (REQUIP) 2 MG tablet Take 2 mg by mouth 3 (three) times daily as needed (RLS).   Marland Kitchen spironolactone (ALDACTONE) 25 MG tablet TAKE ONE-HALF TABLET BY  MOUTH DAILY  . tamsulosin (FLOMAX) 0.4 MG CAPS capsule Take 0.4 mg by mouth daily after supper.   . valsartan-hydrochlorothiazide (DIOVAN-HCT) 80-12.5 MG tablet TAKE 1 TABLET BY MOUTH IN  THE MORNING  . vitamin B-12 (CYANOCOBALAMIN) 1000 MCG tablet Take 1,000 mcg by mouth 2 (two) times daily.  Alveda Reasons 15 MG TABS tablet TAKE 1 TABLET BY MOUTH IN  THE EVENING AFTER DINNER  . torsemide (DEMADEX) 20 MG tablet Take 1 tablet (20 mg total) by mouth 2 (two) times daily as needed (Leg swelling).   Current Facility-Administered Medications (Other)  Medication Route  . dextrose 5 % solution Intravenous  REVIEW OF SYSTEMS: ROS    Positive for: Musculoskeletal, Endocrine, Cardiovascular, Eyes, Respiratory   Negative for: Constitutional, Gastrointestinal, Neurological, Skin, Genitourinary, HENT, Psychiatric, Allergic/Imm, Heme/Lymph   Last edited by Leonie Douglas, COA on 08/26/2020  2:14 PM. (History)       ALLERGIES No Known Allergies  PAST MEDICAL HISTORY Past Medical History:  Diagnosis Date  . Acute anterior wall MI ([place]) 03/01/2018  . Acute combined systolic and diastolic heart failure ([place]) 03/15/2018  . Bronchitis, acute 05/04/2012  . Burn   . Carotid stenosis, left followed by dr [name], cardiologist--- bilateral bruit per note   per dr [name] note 15/4008  LICA 67-61% stenosis per duplex 05-14-2014  .  Chronic diastolic CHF (congestive heart failure) ([place]) 09/21/2018  . Defect, retina, with detachment   . Dementia ([place])   . Diverticulosis   . DM (diabetes mellitus) type 2, uncontrolled, with ketoacidosis ([place]) 11/25/2013  . ED (erectile dysfunction)   . First degree heart block   . GERD (gastroesophageal reflux disease)   . Heart murmur   . History of blood transfusion    with open heart surgery  . Hypertension    cardiologsit-  dr [name]  . Hypertensive retinopathy    OU  . Iron deficiency anemia   . Mixed hyperlipidemia   . Morbid obesity ([place])   . Myocardial infarction ([place])    02/28/18  . OA (osteoarthritis)    right hip  . OSA on CPAP    followed by dr dohmeier, uses CPAP  . PAF (paroxysmal atrial fibrillation) ([name])    a. on Eliquis  . Prostate cancer ([place])    dx 03-17-2017 (bx)  Stage T2a, Gleason 4+4, PSA  4.43, vol 32.32--  s/p radiatctive prostate seed implants 07-10-2017 then IMRT and ADT  . Retinal detachment    Rheg. RD OS  . RLS (restless legs syndrome)   . S/P aortic valve replacement with bioprosthetic valve 05/30/2018   23 mm Edwards Inspiris Resilia stented bovine pericardial tissue valve  . 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 S/P Maze operation for atrial fibrillation 05/30/2018   Complete bilateral atrial lesion set using bipolar radiofrequency and cryothermy ablation with clipping of LA appendage  . Scoliosis   . Severe aortic stenosis   . Trigger finger   . Type 2 diabetes mellitus ([place])   . Wears partial dentures    upper and lower   Past Surgical History:  Procedure Laterality Date  . AORTIC VALVE REPLACEMENT N/A 05/30/2018   Procedure: AORTIC VALVE REPLACEMENT (AVR) using Inspiris Valve, Size 23;  Surgeon: [name], MD;  Location: [place];  Service: Open Heart Surgery;  Laterality: N/A;  . APPENDECTOMY  1988  . BILATERAL TOTAL ETHMOIDECTOMY AND SPHENOIDECTOMY  01-14-2009    dr bates   [facility]  . CARDIOVERSION N/A 08/10/2018    Procedure: CARDIOVERSION;  Surgeon: Adrian Prows, MD;  Location: [place];  Service: Cardiovascular;  Laterality: N/A;  . CARPAL TUNNEL RELEASE Bilateral 2013  . CATARACT EXTRACTION Bilateral    Dr. [name]  . CATARACT EXTRACTION W/ INTRAOCULAR LENS  IMPLANT, BILATERAL  date?  . CORONARY ARTERY BYPASS GRAFT N/A 05/30/2018   Procedure: CORONARY ARTERY BYPASS GRAFTING (CABG) x one using left internal mammary artery;  Surgeon: [name], MD;  Location: [place] [place];  Service: Open Heart Surgery;  Laterality: N/A;  . CORONARY BALLOON ANGIOPLASTY N/A 03/01/2018   Procedure: CORONARY BALLOON ANGIOPLASTY;  Surgeon: Adrian Prows, MD;  Location: [facility];  Service: Cardiovascular;  Laterality: N/A;  . CORONARY/GRAFT ACUTE MI REVASCULARIZATION N/A 03/01/2018   Procedure: Coronary/Graft Acute MI Revascularization;  Surgeon: Adrian Prows, MD;  Location: [place] [facility];  Service: Cardiovascular;  Laterality: N/A;  . CYSTOSCOPY  07/19/2017   Procedure: CYSTOSCOPY;  Surgeon: Nickie Retort, MD;  Location: [facility];  Service: Urology;;  No seeds found in [place]  . EYE SURGERY Bilateral    Cat Sx OU.  Dislocated IOL sx OD.  Rheg. RD repair OS.  . EYE SURGERY    . GAS/FLUID EXCHANGE Right 06/13/2019   Procedure: GAS/FLUID EXCHANGE;  Surgeon: [name], MD;  Location: [place];  Service: Ophthalmology;  Laterality: Right;  . LAMINECTOMY WITH POSTERIOR LATERAL ARTHRODESIS LEVEL 2 N/A 02/12/2014   Procedure: LUMBAR TWO-THREE,LUIMBAR THREE-FOUR LAMINECTOMY/FORAMINOTOMY;POSSIBLE POSTEROLATERAL ARTHRODESIS WITH AUTOGRAFT;  Surgeon: [name], MD;  Location: [facility];  Service: Neurosurgery;  Laterality: N/A;  . LEFT HEART CATH AND CORONARY ANGIOGRAPHY N/A 03/01/2018   Procedure: LEFT HEART CATH AND CORONARY ANGIOGRAPHY;  Surgeon: Adrian Prows, MD;  Location: [place] [facility];  Service: Cardiovascular;  Laterality: N/A;  . MAZE N/A 05/30/2018   Procedure: MAZE;  Surgeon: [name], MD;  Location: [place];  Service: Open Heart Surgery;  Laterality: N/A;  . ORIF RIGHT ANKLE FX'S  05/05/2001   retained hardware  . PARS PLANA VITRECTOMY Right 06/13/2019   Procedure: PARS PLANA VITRECTOMY WITH 25 GAUGE WITH LASER AND GAS;  Surgeon: [name], MD;  Location: [place];  Service: Ophthalmology;  Laterality: Right;  . PARS PLANA VITRECTOMY Right 09/12/2019   Procedure: [place] Vitrectomy With 25 Gauge;  Surgeon: [name], MD;  Location: [place];  Service: Ophthalmology;  Laterality: Right;  . [place] INJECTION Right 06/13/2019   Procedure: PERFLUORONE INJECTION;  Surgeon: [name], MD;  Location: [facility];  Service: Ophthalmology;  Laterality: Right;  . PHOTOCOAGULATION WITH LASER Right 06/13/2019   Procedure: PHOTOCOAGULATION WITH LASER;  Surgeon: [name], MD;  Location: [place];  Service: Ophthalmology;  Laterality: Right;  . PLACEMENT AND SUTURE OF SECONDARY INTRAOCULAR LENS Right 09/12/2019   Procedure: PLACEMENT AND SUTURE OF SECONDARY INTRAOCULAR LENS;  Surgeon: [name], MD;  Location: [place];  Service: Ophthalmology;  Laterality: Right;  . PROSTATE BIOPSY  03-17-2017   dr Pilar Jarvis office  . RADIOACTIVE SEED IMPLANT N/A 07/19/2017   Procedure: RADIOACTIVE SEED IMPLANT/BRACHYTHERAPY IMPLANT;  Surgeon: Nickie Retort, MD;  Location: [facility];  Service: Urology;  Laterality: N/A;  69 seeds implanted  . REMOVAL RETAINED LENS Right 09/12/2019   Procedure: REMOVAL RETAINED LENS;  Surgeon: [name], MD;  Location: [place];  Service: Ophthalmology;  Laterality: Right;  . RETINAL DETACHMENT SURGERY Right 06/13/2019   Dr. Coralyn Pear  . SKIN GRAFT     face  . SPACE OAR INSTILLATION N/A 07/19/2017   Procedure: SPACE OAR INSTILLATION;  Surgeon: Nickie Retort, MD;  Location[facility];  Service: Urology;  Laterality: N/A;  . TEE WITHOUT CARDIOVERSION  03/20/2012   Procedure: TRANSESOPHAGEAL ECHOCARDIOGRAM (TEE);   Surgeon: Laverda Page, MD;  Location: [facility];  Service: Cardiovascular;  Laterality: N/A;  normal LV; normal EF; normal RV; normal LA w/ left atrial appendage very small, normal function, interatrial septum intact without defect; normal RA; trace MR,TR, & PI; mild AV calcification and senile degeneration w/ mild stenosis, AVA 1.7cm^2;;   . TEE WITHOUT CARDIOVERSION N/A 05/30/2018  Procedure: TRANSESOPHAGEAL ECHOCARDIOGRAM (TEE);  Surgeon: [name], MD;  Location: [facility];  Service: Open Heart Surgery;  Laterality: N/A;  . TOTAL HIP ARTHROPLASTY Right 10/23/2017   Procedure: TOTAL HIP ARTHROPLASTY ANTERIOR APPROACH;  Surgeon: Frederik Pear, MD;  Location: [place];  Service: Orthopedics;  Laterality: Right;  . TRANSTHORACIC ECHOCARDIOGRAM  01-11-2017   dr [name]  (per echo note, no significant change in seveity of AS, no other diagnostic change)   moderate concentric LVH, ef 47%, grade 1 diastolic dysfunction/  moderate LAE/  mild , grade 1 AR w/ moderate AV calcification, mild to moderate restricted AV leaflets w/ moderate AS, AVA 1.16cm^2, peak grandient 11mHg, mean grandient 363mg/  trace MR, mild calcification MV annulus , mild MV leaflet calcification, mild MVS, peak grandient 4.27m83m, mean grandient 2.7mm78m trace TR    FAMILY HISTORY Family History  Problem Relation Age of Onset  . Stroke Mother   . Hypertension Mother   . Heart attack Father   . Heart murmur Father   . Hypertension Sister   . Lung disease Brother   . Colon cancer Neg Hx   . Stomach cancer Neg Hx   . Rectal cancer Neg Hx   . Pancreatic cancer Neg Hx     SOCIAL HISTORY Social History   Tobacco Use  . Smoking status: Former Smoker    Packs/day: 0.25    Years: 1.00    Pack years: 0.25    Types: Cigarettes    Quit date: 02/04/1967    Years since quitting: 53.5  . Smokeless tobacco: Never Used  . Tobacco comment: smoked 1 q2-3 days  Vaping Use  . Vaping Use: Never used  Substance Use Topics  .  Alcohol use: Yes    Comment: occasional beer  . Drug use: No         OPHTHALMIC EXAM:  Base Eye Exam    Visual Acuity (Snellen - Linear)      Right Left   Dist cc 20/40 +1 20/20   Dist ph cc NI    Correction: Glasses       Tonometry (Tonopen, 2:22 PM)      Right Left   Pressure 16 14       Pupils      Dark Light Shape React APD   Right 4 3 Round Minimal None   Left 4 3 Round Minimal None       Visual Fields (Counting fingers)      Left Right    Full Full       Extraocular Movement      Right Left    Full Full       Neuro/Psych    Oriented x3: Yes   Mood/Affect: Normal       Dilation    Both eyes: 1.0% Mydriacyl, 2.5% Phenylephrine @ 2:22 PM        Slit Lamp and Fundus Exam    Slit Lamp Exam      Right Left   Lids/Lashes mild Meibomian gland dysfunction, mild Telangiectasia Dermatochalasis - upper lid, Meibomian gland dysfunction, Edema, Erythema lower lid, positive concretion left palp conj   Conjunctiva/Sclera White and quiet; residual STK in ST quadrant Temporal Pinguecula   Cornea well healed superior corneal wound, 1+Punctate epithelial erosions, Arcus, mild tear film debris, fine endo pigment 1+ Punctate epithelial erosions, +verticellata, trace endo pigment, Debris in tear film   Anterior Chamber Deep, trace pigment Deep and quiet   Iris Round and dilated to  25m, scattered Transillumination defects 360, +iridodenesis Round and dilated   Lens Sutured Akreos IOL well centered  3 piece Posterior chamber intraocular in good position, trace Posterior capsular opacification   Vitreous post vitrectomy, trace pigment Vitreous syneresis, Posterior vitreous detachment       Fundus Exam      Right Left   Disc 1+Pallor, Sharp rim 2+Pallor, Sharp rim, temporal PPP, mild tilt   C/D Ratio 0.3 0.5   Macula Blunted foveal reflex, mild RPE mottling and clumping, interval recurrence of central cystic changes, scattered MA Flat, Blunted foveal reflex, Retinal  pigment epithelial mottling and clumping, No heme or edema   Vessels Mild Vascular attenuation, mild Tortuousity, mild A/V crossing changes Vascular attenuation, mild Tortuous   Periphery attached, good 360 laser in place; ORIGINALLY: bullous superior retinal detachment from 1000-0130, another more shallow detachment lobe from 130-400, lattice and micro tears ST quadrant; Old retinal tear at 0900 with surrounding laser Attached, peripheral laser scars at 1030, No new RT/RD        Refraction    Wearing Rx      Sphere Cylinder Axis Add   Right -1.25 +1.50 025 +2.75   Left -1.00 +1.75 180 +2.75          IMAGING AND PROCEDURES  Imaging and Procedures for _0 @  OCT, Retina - OU - Both Eyes       Right Eye Quality was good. Central Foveal Thickness: 382. Progression has worsened. Findings include normal foveal contour, no SRF, intraretinal fluid (Interval recurrence of IRF/CME Nasal fovea; trace ERM).   Left Eye Quality was good. Central Foveal Thickness: 270. Progression has been stable. Findings include normal foveal contour, no IRF, no SRF.   Notes *Images captured and stored on drive  Diagnosis / Impression:  OD: Interval recurrence of IRF/CME Nasal fovea; trace ERM OS: NFP, no IRF/SRF   Clinical management:  See below  Abbreviations: NFP - Normal foveal profile. CME - cystoid macular edema. PED - pigment epithelial detachment. IRF - intraretinal fluid. SRF - subretinal fluid. EZ - ellipsoid zone. ERM - epiretinal membrane. ORA - outer retinal atrophy. ORT - outer retinal tubulation. SRHM - subretinal hyper-reflective material        Injection into Tenon's Capsule - OD - Right Eye       Time Out 08/26/2020. 3:42 PM. Confirmed correct patient, procedure, site, and patient consented.   Anesthesia Topical anesthesia was used. Anesthetic medications included Lidocaine 2%, Proparacaine 0.5%.   Procedure Preparation included 5% betadine to ocular surface. A 27 gauge  needle was used.   Injection:  4 mg KENALOG-45mml injection 28m55m NDC: [id number]: [id number] date: 09/28/2021   Route: Other, Site: Right Eye  Post-op Post injection exam found visual acuity of at least counting fingers. The patient tolerated the procedure well. There were no complications. The patient received written and verbal post procedure care education. Post injection medications were not given.   Notes 1 cc of Kenalog-40 (40 mg) was injected into subtenon's capsule in the superotemporal quadrant. Betadine was applied to Injection area pre and post-injection then rinsed with sterile BSS. 1 drop of polymixin was instilled into the eye. There were no complications. Pt tolerated procedure well.                 ASSESSMENT/PLAN:    ICD-10-CM   1. Dislocation of intraocular lens, sequela  T85.22XS   2. Aphakia of right eye  H27.01   3. Right retinal detachment  H33.21   4. 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 Retinal edema  H35.81 OCT, Retina - OU - Both Eyes  6. History of repair of retinal tear by laser photocoagulation  Z98.890   7. Diabetes mellitus type 2 without retinopathy ([place])  E11.9   8. Essential hypertension  I10   9. Hypertensive retinopathy of both eyes  H35.033   10. Pseudophakia of both eyes  Z96.1   11. Cystoid macular edema of right eye  H35.351 Injection into Tenon's Capsule - OD - Right Eye    triamcinolone acetonide (KENALOG-40) injection 4 mg   1,2. Dislocated IOL OD  - pt with history of partially dislocated 3-piece IOL -- spontaneously dislocated completely into vitreous cavity on 1.23.21  - s/p 25g PPV w/ IOL explantation and secondary sutured IOL OD -- Akreos AO60 lens, 17.5D -- 02.11.2021             - IOL in good position  - 2 superior nylon sutures removed at slit lamp 03.12.21 -- last nylon suture removed at slit lamp, 04.30.21  - corneal edema resolved; PEE improved             - s/p STK OD #1 (07.09.21) for  CME  - BCVA increased to 20/40 from 20/30+2  - OCT shows interval recurrence of IRF/CME nasal macula (see #11 below)  - IOP okay at 16  - ATs QID OU              - f/u 3-4 months -- DFE/OCT   3-5. Rhegmatogenous retinal detachment, right eye  - bullous, superior, mac off detachment, onset of foveal involvement 11.11.20 by history  - Bi-lobed superior detachment, superior lobe spanning 1000-0130, nasal lobe 0130-0400  - lattice degeneration with microtears noted at 1030  - s/p PPV/PFC/EL/FAX/14% C3F8 OD, 11.12.20             - intraop: HST at 2 oclock was found; also detachment had progressed to subtotal detachment spanning 9 oclock to 6 oclock (going in clockwise direction); also severe zonular insufficiency with IOL very mobile throughout case -- did not dislocate completely during the case or immediately post op  - doing well              - retina attached and in good position--good laser in place  6. History of retinal defects s/p laser retinopexy OU with Dr. Zigmund Daniel in 2013  - OD laser at 0900  - OS laser at 1030  - stable  7. Diabetes mellitus, type 2 without retinopathy  - The incidence, risk factors for progression, natural history and treatment options for diabetic retinopathy  were discussed with patient.    - The need for close monitoring of blood glucose, blood pressure, and serum lipids, avoiding cigarette or any type of tobacco, and the need for long term follow up was also discussed with patient  8,9. Hypertensive retinopathy OU  - discussed importance of tight BP control  - monitor  10. Pseudophakia OU  - s/p CE/IOL OU (Dr. [name])  - 3 piece IOL OD was completely displaced into vitreous -- now with sutured Akreos IOL in good position OD -- see above  - OS 3-piece IOL in good position  - monitor  11. CME OD  - post op edema  - mild worsening on PF and prolensa QID OD  - s/p STK OD 07.09.21  - interval recurrence of IRF/CME on OCT today 1.26.22  - recommend STK  OD #2 today, 01.26.22 and restarting PF qdaily OD  - pt wishes to proceed  - RBA of procedure discussed, questions answered  - informed consent obtained and signed  - see procedure note  - will re-start PF Qdaily OD  - f/u 6 weeks, DFE, OCT  Ophthalmic Meds Ordered this visit:  Meds ordered this encounter  Medications  . prednisoLONE acetate (PRED FORTE) 1 % ophthalmic suspension    Sig: Place 1 drop into the right eye daily.    Dispense:  15 mL    Refill:  0  . triamcinolone acetonide (KENALOG-40) injection 4 mg       Return in about 6 weeks (around 10/07/2020) for f/u CME OD, DFE, OCT.  There are no Patient Instructions on file for this visit.   Explained the diagnoses, plan, and follow up with the patient and they expressed understanding.  Patient expressed understanding of the importance of proper follow up care.  This document serves as a record of services personally performed by Gardiner Sleeper, MD, PhD. It was created on their behalf by Roselee Nova, COMT. The creation of this record is the provider's dictation and/or activities during the visit.  Electronically signed by: Roselee Nova, COMT 08/26/20 5:21 PM  Gardiner Sleeper, M.D., Ph.D. Diseases & Surgery of the Retina and Vitreous [name]  I have reviewed the above documentation for accuracy and completeness, and I agree with the above. Gardiner Sleeper, M.D., Ph.D. 08/26/20 5:21 PM   Abbreviations: M myopia (nearsighted); A astigmatism; H hyperopia (farsighted); P presbyopia; Mrx spectacle prescription;  CTL contact lenses; OD right eye; OS left eye; OU both eyes  XT exotropia; ET esotropia; PEK punctate epithelial keratitis; PEE punctate epithelial erosions; DES dry eye syndrome; MGD meibomian gland dysfunction; ATs artificial tears; PFAT's preservative free artificial tears; [place] nuclear sclerotic cataract; PSC posterior subcapsular cataract; ERM epi-retinal membrane; PVD posterior vitreous  detachment; RD retinal detachment; DM diabetes mellitus; DR diabetic retinopathy; NPDR non-proliferative diabetic retinopathy; PDR proliferative diabetic retinopathy; CSME clinically significant macular edema; DME diabetic macular edema; dbh dot blot hemorrhages; CWS cotton wool spot; POAG primary open angle glaucoma; C/D cup-to-disc ratio; HVF humphrey visual field; GVF goldmann visual field; OCT optical coherence tomography; IOP intraocular pressure; BRVO Branch retinal vein occlusion; CRVO central retinal vein occlusion; CRAO central retinal artery occlusion; BRAO branch retinal artery occlusion; RT retinal tear; SB scleral buckle; PPV pars plana vitrectomy; VH Vitreous hemorrhage; PRP panretinal laser photocoagulation; IVK intravitreal kenalog; VMT vitreomacular traction; MH Macular hole;  NVD neovascularization of the disc; NVE neovascularization elsewhere; AREDS age related eye disease study; ARMD age related macular degeneration; POAG primary open angle glaucoma; EBMD epithelial/anterior basement membrane dystrophy; ACIOL anterior chamber intraocular lens; IOL intraocular lens; PCIOL posterior chamber intraocular lens; Phaco/IOL phacoemulsification with intraocular lens placement; [place] photorefractive keratectomy; LASIK laser assisted in situ keratomileusis; HTN hypertension; DM diabetes mellitus; COPD chronic obstructive pulmonary disease

## 2020-08-26 ENCOUNTER — Ambulatory Visit (INDEPENDENT_AMBULATORY_CARE_PROVIDER_SITE_OTHER): Payer: Medicare Other | Admitting: Ophthalmology

## 2020-08-26 ENCOUNTER — Encounter (INDEPENDENT_AMBULATORY_CARE_PROVIDER_SITE_OTHER): Payer: Self-pay | Admitting: Ophthalmology

## 2020-08-26 ENCOUNTER — Other Ambulatory Visit: Payer: Self-pay

## 2020-08-26 DIAGNOSIS — H3321 Serous retinal detachment, right eye: Secondary | ICD-10-CM

## 2020-08-26 DIAGNOSIS — T8522XS Displacement of intraocular lens, sequela: Secondary | ICD-10-CM | POA: Diagnosis not present

## 2020-08-26 DIAGNOSIS — H2701 Aphakia, right eye: Secondary | ICD-10-CM | POA: Diagnosis not present

## 2020-08-26 DIAGNOSIS — H35411 Lattice degeneration of retina, right eye: Secondary | ICD-10-CM

## 2020-08-26 DIAGNOSIS — Z961 Presence of intraocular lens: Secondary | ICD-10-CM

## 2020-08-26 DIAGNOSIS — H35351 Cystoid macular degeneration, right eye: Secondary | ICD-10-CM

## 2020-08-26 DIAGNOSIS — E119 Type 2 diabetes mellitus without complications: Secondary | ICD-10-CM

## 2020-08-26 DIAGNOSIS — H35033 Hypertensive retinopathy, bilateral: Secondary | ICD-10-CM

## 2020-08-26 DIAGNOSIS — H3581 Retinal edema: Secondary | ICD-10-CM

## 2020-08-26 DIAGNOSIS — I10 Essential (primary) hypertension: Secondary | ICD-10-CM

## 2020-08-26 DIAGNOSIS — Z9889 Other specified postprocedural states: Secondary | ICD-10-CM

## 2020-08-26 MED ORDER — PREDNISOLONE ACETATE 1 % OP SUSP: 1.0000 [drp] | Freq: Every day | OPHTHALMIC | 0 refills | Status: DC

## 2020-08-26 MED ORDER — TRIAMCINOLONE ACETONIDE 40 MG/ML IJ SUSP FOR KALEIDOSCOPE
4.0000 mg | INTRAMUSCULAR | Status: AC | PRN
  Administered 2020-08-26: 4 mg

## 2020-09-04 ENCOUNTER — Other Ambulatory Visit: Payer: Self-pay

## 2020-09-04 ENCOUNTER — Encounter: Payer: Self-pay | Admitting: Cardiology

## 2020-09-04 ENCOUNTER — Ambulatory Visit: Payer: Medicare Other | Admitting: Cardiology

## 2020-09-04 VITALS — BP 120/63 | HR 61 | Temp 98.0°F | Resp 16 | Ht 68.0 in | Wt 210.8 lb

## 2020-09-04 DIAGNOSIS — Z953 Presence of xenogenic heart valve: Secondary | ICD-10-CM

## 2020-09-04 DIAGNOSIS — I4821 Permanent atrial fibrillation: Secondary | ICD-10-CM

## 2020-09-04 DIAGNOSIS — I5032 Chronic diastolic (congestive) heart failure: Secondary | ICD-10-CM

## 2020-09-04 DIAGNOSIS — I484 Atypical atrial flutter: Secondary | ICD-10-CM

## 2020-09-04 DIAGNOSIS — I25118 Atherosclerotic heart disease of native coronary artery with other forms of angina pectoris: Secondary | ICD-10-CM

## 2020-09-04 DIAGNOSIS — I10 Essential (primary) hypertension: Secondary | ICD-10-CM

## 2020-09-04 DIAGNOSIS — E7800 Pure hypercholesterolemia, unspecified: Secondary | ICD-10-CM

## 2020-09-04 NOTE — Progress Notes (Signed)
Primary Physician/Referring:  [name], MD  Patient ID: Cody Phelps, male    DOB: [birth year], 80 y.o.   MRN: [id number]  No chief complaint on file.  HPI:    HPI: Cody Phelps  is a 80 y.o. Caucasian male h/o aortic valve replacement along with LIMA to D1 on 05/30/2018, invasive prostate cancer diagnosed in Nov 2018 and is S/P chemo and RT and in remission, anemia of chronic disease and also mild iron defeciency, hypertension, hyperlipidemia, obstructive sleep apnea on CPAP, controlled diabetes mellitus, mild obesity, mostly truncal obesity. He is also being followed by oncology [name], MD) for anemia and a new mass found on CT chest. 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  He presents here for 78-monthoffice visit.  Has chronic dyspnea, no leg edema, he was started on torsemide which he is tolerating with improvement in leg edema.  No chest pain or palpitations.  Wife present.  His main complaint is severe back pain.  Past Medical History:  Diagnosis Date  . Acute anterior wall MI ([facility] 03/01/2018  . Acute combined systolic and diastolic heart failure ([facility] 03/15/2018  . Burn   . Chronic diastolic CHF (congestive heart failure) ([facility] 09/21/2018  . Defect, retina, with detachment   . Dementia ([facility]   . Diverticulosis   . ED (erectile dysfunction)   . First degree heart block   . GERD (gastroesophageal reflux disease)   . Heart murmur   . History of blood transfusion    with open heart surgery  . Hypertension    cardiologsit-  dr gEinar Gip . Hypertensive retinopathy    OU  . Iron deficiency anemia   . Mixed hyperlipidemia   . Morbid obesity ([facility] [facility]   . Myocardial infarction ([facility]    02/28/18  . OA (osteoarthritis)    right hip  . OSA on CPAP    followed by dr dohmeier, uses CPAP  . PAF (paroxysmal atrial fibrillation) ([name])    a. on Eliquis  . Prostate cancer ([place]    dx 03-17-2017 (bx)  Stage  T2a, Gleason 4+4, PSA  4.43, vol 32.32--  s/p radiatctive prostate seed implants 07-10-2017 then IMRT and ADT  . Retinal detachment    Rheg. RD OS  . RLS (restless legs syndrome)   . S/P aortic valve replacement with bioprosthetic valve 05/30/2018   23 mm Edwards Inspiris Resilia stented bovine pericardial tissue valve  . 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 S/P Maze operation for atrial fibrillation 05/30/2018   Complete bilateral atrial lesion set using bipolar radiofrequency and cryothermy ablation with clipping of LA appendage  . Scoliosis   . Trigger finger   . Type 2 diabetes mellitus ([facility]   . Wears partial dentures    upper and lower   Past Surgical History:  Procedure Laterality Date  . AORTIC VALVE REPLACEMENT N/A 05/30/2018   Procedure: AORTIC VALVE REPLACEMENT (AVR) using Inspiris Valve, Size 23;  Surgeon: ORexene Alberts MD;  [place]  Service: Open Heart Surgery;  Laterality: N/A;  . APPENDECTOMY  1988  . BILATERAL TOTAL ETHMOIDECTOMY AND SPHENOIDECTOMY  01-14-2009    [facility] . CARDIOVERSION N/A 08/10/2018   Procedure: CARDIOVERSION;  Surgeon: GAdrian Prows MD;  Location: [facility]  Service: Cardiovascular;  Laterality: N/A;  . CARPAL TUNNEL RELEASE Bilateral 2013  . CATARACT EXTRACTION Bilateral  Dr. [name]  . CATARACT EXTRACTION W/ INTRAOCULAR LENS  IMPLANT, BILATERAL  date?  . CORONARY ARTERY BYPASS GRAFT N/A 05/30/2018  . CORONARY BALLOON ANGIOPLASTY N/A 03/01/2018   Procedure: CORONARY BALLOON ANGIOPLASTY;  Surgeon: Adrian Prows, MD;  Location: [place] [facility];  Service: Cardiovascular;  Laterality: N/A;  . CORONARY/GRAFT ACUTE MI REVASCULARIZATION N/A 03/01/2018   Procedure: Coronary/Graft Acute MI Revascularization;  Surgeon: Adrian Prows, MD;  Location: [place] [facility];  Service: Cardiovascular;  Laterality: N/A;  . CYSTOSCOPY  07/19/2017   Procedure: CYSTOSCOPY;  Surgeon: Nickie Retort, MD;  Location: [facility];  Service: Urology;;  No seeds found in [place]  . EYE SURGERY Bilateral    Cat Sx OU.  Dislocated IOL sx OD.  Rheg. RD repair OS.  . EYE SURGERY    . GAS/FLUID EXCHANGE Right 06/13/2019   Procedure: GAS/FLUID EXCHANGE;  Surgeon: [name], MD;  Location: [place];  Service: Ophthalmology;  Laterality: Right;  . LAMINECTOMY WITH POSTERIOR LATERAL ARTHRODESIS LEVEL 2 N/A 02/12/2014   Procedure: LUMBAR TWO-THREE,LUIMBAR THREE-FOUR LAMINECTOMY/FORAMINOTOMY;POSSIBLE POSTEROLATERAL ARTHRODESIS WITH AUTOGRAFT;  Surgeon: [name], MD;  [facility];  Service: Neurosurgery;  Laterality: N/A;  . LEFT HEART CATH AND CORONARY ANGIOGRAPHY N/A 03/01/2018   Procedure: LEFT HEART CATH AND CORONARY ANGIOGRAPHY;  Surgeon: Adrian Prows, MD;  Location: [place] [facility];  Service: Cardiovascular;  Laterality: N/A;  . MAZE N/A 05/30/2018   Procedure: MAZE;  Surgeon: [name], MD;  Location: [place];  Service: Open Heart Surgery;  Laterality: N/A;  . ORIF RIGHT ANKLE FX'S  05/05/2001   retained hardware  . PARS PLANA VITRECTOMY Right 06/13/2019   Procedure: PARS PLANA VITRECTOMY WITH 25 GAUGE WITH LASER AND GAS;  Surgeon: [name], MD;  Location: [place];  Service: Ophthalmology;  Laterality: Right;  . PARS PLANA VITRECTOMY Right 09/12/2019   Procedure: [place] Vitrectomy With 25 Gauge;  Surgeon: [name], MD;  Location: [place];  Service: Ophthalmology;  Laterality: Right;  . [place] INJECTION Right 06/13/2019   Procedure: PERFLUORONE INJECTION;  Surgeon: [name], MD;  Location: [place];  Service: Ophthalmology;  Laterality: Right;  . PHOTOCOAGULATION WITH LASER Right 06/13/2019   Procedure: PHOTOCOAGULATION WITH LASER;  Surgeon: [name], MD;  Location: [place];  Service: Ophthalmology;  Laterality: Right;  . PLACEMENT AND SUTURE OF SECONDARY INTRAOCULAR LENS Right 09/12/2019   Procedure: PLACEMENT AND SUTURE OF SECONDARY INTRAOCULAR LENS;  Surgeon: [name], MD;  Location: [place];  Service: Ophthalmology;  Laterality: Right;  . PROSTATE BIOPSY  03-17-2017   dr Pilar Jarvis office  . RADIOACTIVE SEED IMPLANT N/A 07/19/2017   Procedure: RADIOACTIVE SEED IMPLANT/BRACHYTHERAPY IMPLANT;  Surgeon: Nickie Retort, MD;  Location: [facility];  Service: Urology;  Laterality: N/A;  69 seeds implanted  . REMOVAL RETAINED LENS Right 09/12/2019   Procedure: REMOVAL RETAINED LENS;  Surgeon: [name], MD;  Location: [place];  Service: Ophthalmology;  Laterality: Right;  . RETINAL DETACHMENT SURGERY Right 06/13/2019   Dr. Coralyn Pear  . SKIN GRAFT     face  . SPACE OAR INSTILLATION N/A 07/19/2017   Procedure: SPACE OAR INSTILLATION;  Surgeon: Nickie Retort, MD;  Location: [facility];  Service: Urology;  Laterality: N/A;  . TEE WITHOUT CARDIOVERSION  03/20/2012   Procedure: TRANSESOPHAGEAL ECHOCARDIOGRAM (TEE);  Surgeon: Laverda Page, MD;  Location[facility];  Service: Cardiovascular;  Laterality: N/A;  normal LV; normal EF; normal  RV; normal LA w/ left atrial appendage very small, normal function, interatrial septum intact without defect; normal RA; trace MR,TR, & PI; mild AV calcification and senile degeneration w/ mild stenosis, AVA 1.7cm^2;;   . TEE WITHOUT CARDIOVERSION N/A 05/30/2018   Procedure: TRANSESOPHAGEAL ECHOCARDIOGRAM (TEE);  Surgeon: [name], MD;  Location: [place];  Service: Open Heart Surgery;  Laterality: N/A;  . TOTAL HIP ARTHROPLASTY Right 10/23/2017   Procedure: TOTAL HIP ARTHROPLASTY ANTERIOR APPROACH;  Surgeon: Frederik Pear, MD;  Location: [place];  Service: Orthopedics;  Laterality: Right;  . TRANSTHORACIC ECHOCARDIOGRAM  01-11-2017   dr [name]  (per echo note, no significant change in seveity of AS, no other diagnostic change)   moderate concentric LVH, ef 99%, grade 1 diastolic dysfunction/  moderate LAE/  mild , grade 1 AR w/ moderate AV calcification, mild to moderate restricted AV leaflets  w/ moderate AS, AVA 1.16cm^2, peak grandient 30mHg, mean grandient 379mg/  trace MR, mild calcification MV annulus , mild MV leaflet calcification, mild MVS, peak grandient 4.69m30m, mean grandient 2.7mm60m trace TR   Social History   Tobacco Use  . Smoking status: Former Smoker    Packs/day: 0.25    Years: 1.00    Pack years: 0.25    Types: Cigarettes    Quit date: 02/04/1967    Years since quitting: 53.6  . Smokeless tobacco: Never Used  . Tobacco comment: smoked 1 q2-3 days  Substance Use Topics  . Alcohol use: Yes    Comment: occasional beer   Marital Status: Married   ROS  Review of Systems  Cardiovascular: Positive for dyspnea on exertion and leg swelling (occasiona;). Negative for chest pain.  Musculoskeletal: Positive for arthritis and back pain.  Gastrointestinal: Negative for melena.  Neurological: Positive for disturbances in coordination and dizziness.   Objective  Blood pressure 120/63, pulse 61, temperature 98 F (36.7 C), temperature source Temporal, resp. rate 16, height 5' 8"  (1.727 m), weight 210 lb 12.8 oz (95.6 kg), SpO2 97 %. Body mass index is 32.05 kg/m.   Vitals with BMI 09/04/2020 07/16/2020 07/10/2020  Height 5' 8"  5' 8"  5' 6"   Weight 210 lbs 13 oz 217 lbs 2 oz 210 lbs  BMI 32.06 [id number] 120 962 229 798astolic 63 70 57  Pulse 61 63 59    Physical Exam Constitutional:      General: He is not in acute distress.    Appearance: He is well-developed. He is obese.  Neck:     Thyroid: No thyromegaly.     Vascular: No JVD.  Cardiovascular:     Rate and Rhythm: Normal rate and regular rhythm.     Pulses: Intact distal pulses.          Carotid pulses are 2+ on the right side with bruit and 2+ on the left side with bruit.      Popliteal pulses are 2+ on the right side and 2+ on the left side.       Dorsalis pedis pulses are 2+ on the right side and 1+ on the left side.       Posterior tibial pulses are 0 on the right side and 0 on the left  side.     Heart sounds: Murmur heard.   Early systolic murmur is present with a grade of 2/6 at the upper right sternal border radiating to the apex. No gallop.      Comments: Leg edema pitting trace+ bilateral below knee.  Pulmonary:     Effort: Pulmonary effort is normal.     Breath sounds: Normal breath sounds.  Abdominal:     General: Bowel sounds are normal.     Palpations: Abdomen is soft.  Musculoskeletal:        General: Normal range of motion.    Radiology: No results found.  Laboratory examination:   CMP Latest Ref Rng & Units 09/12/2019 06/13/2019 06/02/2018  Glucose 70 - 99 mg/dL 134(H) 85 109(H)  BUN 8 - 23 mg/dL 20 19 19   Creatinine 0.61 - 1.24 mg/dL 1.38(H) 1.10 0.90  Sodium 135 - 145 mmol/L 137 138 136  Potassium 3.5 - 5.1 mmol/L 3.7 4.0 4.0  Chloride 98 - 111 mmol/L 106 108 110  CO2 22 - 32 mmol/L 22 21(L) 21(L)  Calcium 8.9 - 10.3 mg/dL 9.0 9.3 8.0(L)  Total Protein 6.5 - 8.1 g/dL - - -  Total Bilirubin 0.3 - 1.2 mg/dL - - -  Alkaline Phos 38 - 126 U/L - - -  AST 15 - 41 U/L - - -  ALT 0 - 44 U/L - - -   CBC Latest Ref Rng & Units 07/02/2020 01/01/2020 09/12/2019  WBC 4.0 - 10.5 K/uL 5.3 6.3 5.6  Hemoglobin 13.0 - 17.0 g/dL 9.5(L) 10.2(L) 10.6(L)  Hematocrit 39.0 - 52.0 % 29.2(L) 31.4(L) 33.0(L)  Platelets 150 - 400 K/uL 153 176 170   Lipid Panel     Component Value Date/Time   CHOL 151 03/01/2018 0514   TRIG 69 03/01/2018 0514   HDL 55 03/01/2018 0514   CHOLHDL 2.7 03/01/2018 0514   VLDL 14 03/01/2018 0514   LDLCALC 82 03/01/2018 0514   HEMOGLOBIN A1C Lab Results  Component Value Date   HGBA1C 5.8 (H) 05/28/2018   MPG 119.76 05/28/2018   External labs:  Cholesterol, total 143.000 m 01/07/2020 HDL 45 MG/DL 01/07/2020 LDL 70.000 mg 01/07/2020 Triglycerides 142.000 01/07/2020  A1C 6.000 % 01/07/2020 TSH 1.000 01/07/2020  Hemoglobin 10.000 g/d 01/07/2020 Platelets 176.000 01/01/2020   Creatinine, Serum 1.400 mg/ 01/07/2020 Potassium 3.700  09/12/2019 Magnesium N/D ALT (SGPT) 18.000 uni 01/07/2020  Labs 01/02/2019: A1c 5.8%.  Serum glucose 98 mg, BUN 16, creatinine 1.0, eGFR 72 mL, potassium 3.6, CMP otherwise normal.   HB 8.3/HCT 25.9, platelets 182, normal indicis.   Total cholesterol 98, triglycerides 57, HDL 42, LDL 45.  TSH normal.  Medications   Current Outpatient Medications on File Prior to Visit  Medication Sig Dispense Refill  . amiodarone (PACERONE) 200 MG tablet Take 200 mg by mouth daily.    Marland Kitchen atorvastatin (LIPITOR) 80 MG tablet TAKE 1 TABLET BY MOUTH  DAILY 90 tablet 3  . colesevelam (WELCHOL) 625 MG tablet Take 625 mg by mouth 3 (three) times daily.     Marland Kitchen donepezil (ARICEPT) 10 MG tablet Take 10 mg by mouth every morning.     Marland Kitchen esomeprazole (NEXIUM) 40 MG capsule Take 40 mg by mouth daily.     . ferrous sulfate 325 (65 FE) MG tablet Take 1 tablet (325 mg total) by mouth daily with breakfast. 90 tablet 3  . gabapentin (NEURONTIN) 300 MG capsule     . insulin glargine (LANTUS) 100 UNIT/ML injection Inject 20-40 Units into the skin at bedtime as needed (BGL below 140-150 take 20 units, if 151 or higher take 40 units).    . memantine (NAMENDA) 5 MG tablet Take 5 mg by mouth 2 (two) times daily.     . metoprolol tartrate (LOPRESSOR) 25  MG tablet TAKE 1 TABLET BY MOUTH  DAILY (Patient taking differently: Take 12.5 mg by mouth 2 (two) times daily.) 90 tablet 3  . mirabegron ER (MYRBETRIQ) 50 MG TB24 tablet Take 50 mg by mouth at bedtime.     Marland Kitchen omega-3 acid ethyl esters (LOVAZA) 1 G capsule Take 2 g by mouth daily.    . prednisoLONE acetate (PRED FORTE) 1 % ophthalmic suspension Place 1 drop into the right eye daily. 15 mL 0  . rOPINIRole (REQUIP) 2 MG tablet Take 2 mg by mouth 3 (three) times daily as needed (RLS).     Marland Kitchen spironolactone (ALDACTONE) 25 MG tablet TAKE ONE-HALF TABLET BY  MOUTH DAILY 45 tablet 3  . tamsulosin (FLOMAX) 0.4 MG CAPS capsule Take 0.4 mg by mouth daily after supper.     . torsemide (DEMADEX) 20  MG tablet Take 1 tablet (20 mg total) by mouth 2 (two) times daily as needed (Leg swelling). 90 tablet 3  . valsartan-hydrochlorothiazide (DIOVAN-HCT) 80-12.5 MG tablet TAKE 1 TABLET BY MOUTH IN  THE MORNING 90 tablet 3  . vitamin B-12 (CYANOCOBALAMIN) 1000 MCG tablet Take 1,000 mcg by mouth 2 (two) times daily.    Alveda Reasons 15 MG TABS tablet TAKE 1 TABLET BY MOUTH IN  THE EVENING AFTER DINNER 90 tablet 3   No current facility-administered medications on file prior to visit.     CT Chest 06/12/2020: 1. There is a rounded ground-glass opacity of the central left lower lobe measuring 2.8 x 2.5 cm with a solid central nodule measuring 4 mm. This is concerning for adenocarcinoma. Comparison to prior examination dated 03/17/2018 is not possible due to the presence of pleural effusions and atelectasis at that time. Given small size, PET-CT is likely of limited utility to assess for metabolic activity. Recommend initial follow-up examination at 3-6 months to assess for persistence. If unchanged, and solid component remains <6 mm, annual CT is recommended until 5 years of stability has been established. If persistent these nodules should be considered highly suspicious if the solid component of the nodule is 6 mm or greater in size and enlarging. This recommendation follows the consensus statement: Guidelines for Management of Incidental Pulmonary Nodules Detected on CT Images  2. Scattered ground-glass opacity of the dependent left lung base, generally nonspecific and likely scarring and/or partial atelectasis. 3. Coronary artery disease. 4. Status post aortic valve replacement. 5. Aortic Atherosclerosis (ICD10-I70.0).  Cardiac Studies:   Carotid artery duplex 06-05-18: Right Carotid: Velocities in the right ICA are consistent with a 1-39%  stenosis.  Left Carotid: Velocities in the left ICA are consistent with a 1-39%  Stenosis.  Echocardiogram 07/11/2018: Left ventricle cavity is normal in  size. Abnormal septal wall motion due to post-operative coronary artery bypass graft. Diastolic function assessment limited due to mitral annular calcification and post op status. Calculated EF 65%. Left atrial cavity is mildly dilated. Well seated bioprosthetic aortic valve with normal functioning. Mean PG 8 mmHg likely normal for bioprosthetic valve. Moderate calcification of the mitral valve annulus and leaflets. No significant stenosis. Moderate (Grade II) mitral regurgitation. Compared to previous study on 08/24/2017, bioprosthetic aortic valve is new.   Coronary angiogram 03/20/2018: Left main mildly calcified but normal, LAD large caliber vessel, mild to moderate diffuse disease followed by occlusion in the mid to distal segment, distally the LAD appears to be diffusely diseased with TIMI I flow.  Aborted PTCA. D-2 is moderate to  large sized with tandem 80 to 90% stenosis.  Mild disease in other vessels. Severe aortic stenosis with peak to peak gradient of 69 and mean gradient of 53 mmHg.  Severely calcified mitral annulus and moderately calcified aortic valve.  EKG:    EKG 09/04/2020: Atypical atrial flutter with 3: 1 conduction at rate of 60 bpm, left axis deviation, left anterior fascicular block.  Poor R wave progression, anterolateral infarct old.  Nonspecific T abnormality.   No significant change from EKG 02/20/2020, 12/08/2019.  Assessment     ICD-10-CM   1. Atypical atrial flutter ([name])  I48.4 EKG 12-Lead  2. Permanent atrial fibrillation ([name])  I48.21 CBC    CMP14+EGFR  3. S/P aortic valve replacement with bioprosthetic valve  Z95.3   4. Essential hypertension  I10 TSH  5. Coronary artery disease of native artery of native heart with stable angina pectoris ([place])  I25.118   6. Chronic diastolic CHF (congestive heart failure) ([name])  I50.32 Magnesium    Brain natriuretic peptide  7. Hypercholesteremia  E78.00 Lipid Panel With LDL/HDL Ratio    CHA2DS2-VASc Score is 5.  Yearly risk  of stroke: 7.2% (A, HTN, DM, Vasc Dz).  Score of 1=0.6; 2=2.2; 3=3.2; 4=4.8; 5=7.2; 6=9.8; 7=>9.8) -(CHF; HTN; vasc disease DM,  Male = 1; Age <65 =0; 65-74 = 1,  >75 =2; stroke/embolism= 2).    Recommendations:   Cody Phelps  is a 80 y.o. Caucasian male h/o aortic valve replacement along with LIMA to D1 on 05/30/2018, invasive prostate cancer diagnosed in Nov 2018 and is S/P chemo and RT and in remission, anemia of chronic disease and also mild iron defeciency, hypertension, hyperlipidemia, obstructive sleep apnea on CPAP, controlled diabetes mellitus, mild obesity, mostly truncal obesity. He is also being followed by oncology [name], MD) for anemia and a new mass found on CT chest. 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  His dyspnea is chronic and stable and there is no clinical evidence of heart failure.  He is also lost some weight since last office visit.  Blood pressure is well controlled and lipids are also under excellent control.  In view of patient being on anticoagulation, he needs biannual labs, orders were placed.  In view of his chronic dyspnea, as he is stable, will also obtain a BNP as a baseline.  His occasional leg cramps are related to spinal stenosis, and continues to have severe back pain, do not suspect significant PAD.  We will also check magnesium levels as he is on diuretics.  With regard to atypical atrial flutter/atrial fibrillation, as he remained stable his amiodarone was discontinued 6 months ago, rate control strategy applied.  Aortic valve appears to be stable by physical exam, patient aware of endocarditis prophylaxis.  I will see him back in 6 months.    Adrian Prows, MD, [facility] 09/05/2020, 10:49 AM Office: [phone]

## 2020-09-05 ENCOUNTER — Encounter: Payer: Self-pay | Admitting: Cardiology

## 2020-09-10 ENCOUNTER — Ambulatory Visit: Payer: Medicare Other | Admitting: Cardiology

## 2020-09-16 LAB — CMP14+EGFR
ALT: 21 IU/L (ref 0–44)
AST: 18 IU/L (ref 0–40)
Albumin/Globulin Ratio: 1.7 (ref 1.2–2.2)
Albumin: 4.4 g/dL (ref 3.7–4.7)
Alkaline Phosphatase: 17 IU/L — ABNORMAL LOW (ref 44–121)
BUN/Creatinine Ratio: 13 (ref 10–24)
BUN: 15 mg/dL (ref 8–27)
Bilirubin Total: 0.4 mg/dL (ref 0.0–1.2)
CO2: 22 mmol/L (ref 20–29)
Calcium: 9.4 mg/dL (ref 8.6–10.2)
Chloride: 103 mmol/L (ref 96–106)
Creatinine, Ser: 1.19 mg/dL (ref 0.76–1.27)
GFR calc Af Amer: 68 mL/min/{1.73_m2} (ref >59)
GFR calc non Af Amer: 59 mL/min/{1.73_m2} — ABNORMAL LOW (ref >59)
Globulin, Total: 2.6 g/dL (ref 1.5–4.5)
Glucose: 94 mg/dL (ref 65–99)
Potassium: 4.3 mmol/L (ref 3.5–5.2)
Sodium: 141 mmol/L (ref 134–144)
Total Protein: 7 g/dL (ref 6.0–8.5)

## 2020-09-16 LAB — LIPID PANEL WITH LDL/HDL RATIO
Cholesterol, Total: 142 mg/dL (ref 100–199)
HDL: 53 mg/dL (ref >39)
LDL Chol Calc (NIH): 70 mg/dL (ref 0–99)
LDL/HDL Ratio: 1.3 ratio (ref 0.0–3.6)
Triglycerides: 101 mg/dL (ref 0–149)
VLDL Cholesterol Cal: 19 mg/dL (ref 5–40)

## 2020-09-16 LAB — BRAIN NATRIURETIC PEPTIDE: BNP: 86.4 pg/mL (ref 0.0–100.0)

## 2020-09-16 LAB — CBC
Hematocrit: 32.7 % — ABNORMAL LOW (ref 37.5–51.0)
Hemoglobin: 10.6 g/dL — ABNORMAL LOW (ref 13.0–17.7)
MCH: 28.5 pg (ref 26.6–33.0)
MCHC: 32.4 g/dL (ref 31.5–35.7)
MCV: 88 fL (ref 79–97)
Platelets: 191 10*3/uL (ref 150–450)
RBC: 3.72 x10E6/uL — ABNORMAL LOW (ref 4.14–5.80)
RDW: 14.5 % (ref 11.6–15.4)
WBC: 6.3 10*3/uL (ref 3.4–10.8)

## 2020-09-16 LAB — TSH: TSH: 1.38 u[IU]/mL (ref 0.450–4.500)

## 2020-09-16 LAB — MAGNESIUM: Magnesium: 2.1 mg/dL (ref 1.6–2.3)

## 2020-09-17 ENCOUNTER — Other Ambulatory Visit: Payer: Self-pay

## 2020-09-17 ENCOUNTER — Ambulatory Visit
Admission: RE | Admit: 2020-09-17 | Discharge: 2020-09-17 | Disposition: A | Discharge status: Home / Self Care | Payer: Medicare Other | Source: Ambulatory Visit | Attending: Pulmonary Disease | Admitting: Pulmonary Disease

## 2020-09-17 DIAGNOSIS — R918 Other nonspecific abnormal finding of lung field: Secondary | ICD-10-CM

## 2020-09-21 ENCOUNTER — Encounter: Payer: Self-pay | Admitting: Pulmonary Disease

## 2020-09-21 ENCOUNTER — Other Ambulatory Visit: Payer: Self-pay

## 2020-09-21 ENCOUNTER — Ambulatory Visit (INDEPENDENT_AMBULATORY_CARE_PROVIDER_SITE_OTHER): Payer: Medicare Other | Admitting: Pulmonary Disease

## 2020-09-21 VITALS — BP 140/62 | HR 62 | Temp 97.4°F | Ht 68.0 in | Wt 216.0 lb

## 2020-09-21 DIAGNOSIS — F17290 Nicotine dependence, other tobacco product, uncomplicated: Secondary | ICD-10-CM

## 2020-09-21 DIAGNOSIS — J984 Other disorders of lung: Secondary | ICD-10-CM | POA: Diagnosis not present

## 2020-09-21 DIAGNOSIS — R942 Abnormal results of pulmonary function studies: Secondary | ICD-10-CM

## 2020-09-21 DIAGNOSIS — J849 Interstitial pulmonary disease, unspecified: Secondary | ICD-10-CM | POA: Diagnosis not present

## 2020-09-21 DIAGNOSIS — R918 Other nonspecific abnormal finding of lung field: Secondary | ICD-10-CM

## 2020-09-21 DIAGNOSIS — I10 Essential (primary) hypertension: Secondary | ICD-10-CM | POA: Diagnosis not present

## 2020-09-21 DIAGNOSIS — Z87891 Personal history of nicotine dependence: Secondary | ICD-10-CM

## 2020-09-21 DIAGNOSIS — Z953 Presence of xenogenic heart valve: Secondary | ICD-10-CM

## 2020-09-21 DIAGNOSIS — Z951 Presence of aortocoronary bypass graft: Secondary | ICD-10-CM

## 2020-09-21 NOTE — Progress Notes (Signed)
Synopsis: Referred in December 2021 for groundglass opacity by [name], MD  Subjective:   PATIENT ID: Cody Phelps GENDER: male DOB: 1941-05-04, MRN: [id number]  Chief Complaint  Patient presents with  . Follow-up    No complaints currently    This is a 80 year old gentleman with a past medical history of MI, chronic systolic and diastolic heart failure, prior CABG plus valve in 2019 by Dr. Roxy Manns.  Hypertension, GERD, diabetes.  Patient had a recent CT scan of the chest on 06/11/2020 which revealed a 2.8 x 2.5 groundglass opacity within the left lower lobe concerning for a potential underlying primary lung cancer such as a lipidic adenocarcinoma.  Patient was seen by cardiothoracic surgery, Dr. Kipp Brood and referred to [place] for image follow-up and consideration for bronchoscopy if needed.  Patient denies weight loss, shortness of breath neurologic symptoms or hemoptysis.  He still occasionally smokes a pipe.    09/21/2020: Here today for follow-up regarding the CT scan chest.  Recent CT chest reveals resolution of previously rounded groundglass opacity that was seen in December.  However does show lower lobe areas of septal thickening which may be related to scarring from previous surgeries.  This in conjunction with his prior pulmonary function test.  Patient's PFTs were completed in August 2019 which revealed a reduced DLCO of 39% predicted a normal ratio but also a reduced FEV1 and FVC.  Due to the abnormalities consistent with significant restriction present we will have a HRCT complete for next follow-up of nodule.  We reviewed this today in the office with patient.  From a respiratory standpoint he has no complaints today.   Past Medical History:  Diagnosis Date  . Acute anterior wall MI ([place]) 03/01/2018  . Acute combined systolic and diastolic heart failure ([place]) 03/15/2018  . Burn   . Chronic diastolic CHF (congestive heart failure) ([place]) 09/21/2018  . Defect, retina, with  detachment   . Dementia ([place])   . Diverticulosis   . ED (erectile dysfunction)   . First degree heart block   . GERD (gastroesophageal reflux disease)   . Heart murmur   . History of blood transfusion    with open heart surgery  . Hypertension    cardiologsit-  dr [name]  . Hypertensive retinopathy    OU  . Iron deficiency anemia   . Mixed hyperlipidemia   . Morbid obesity ([place])   . Myocardial infarction ([place])    02/28/18  . OA (osteoarthritis)    right hip  . OSA on CPAP    followed by dr dohmeier, uses CPAP  . PAF (paroxysmal atrial fibrillation) ([name])    a. on Eliquis  . Prostate cancer ([place])    dx 03-17-2017 (bx)  Stage T2a, Gleason 4+4, PSA  4.43, vol 32.32--  s/p radiatctive prostate seed implants 07-10-2017 then IMRT and ADT  . Retinal detachment    Rheg. RD OS  . RLS (restless legs syndrome)   . S/P aortic valve replacement with bioprosthetic valve 05/30/2018   23 mm Edwards Inspiris Resilia stented bovine pericardial tissue valve  . 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 S/P Maze operation for atrial fibrillation 05/30/2018   Complete bilateral atrial lesion set using bipolar radiofrequency and cryothermy ablation with clipping of LA appendage  . Scoliosis   . Trigger finger   . Type 2 diabetes mellitus ([place])   . Wears partial dentures    upper and lower  Family History  Problem Relation Age of Onset  . Stroke Mother   . Hypertension Mother   . Heart attack Father   . Heart murmur Father   . Hypertension Sister   . Lung disease Brother   . Colon cancer Neg Hx   . Stomach cancer Neg Hx   . Rectal cancer Neg Hx   . Pancreatic cancer Neg Hx      Past Surgical History:  Procedure Laterality Date  . AORTIC VALVE REPLACEMENT N/A 05/30/2018   Procedure: AORTIC VALVE REPLACEMENT (AVR) using Inspiris Valve, Size 23;  Surgeon: [name], MD;  Location: [facility];  Service: Open Heart Surgery;  Laterality: N/A;  . APPENDECTOMY  1988  .  BILATERAL TOTAL ETHMOIDECTOMY AND SPHENOIDECTOMY  01-14-2009    dr bates   [facility]  . CARDIOVERSION N/A 08/10/2018   Procedure: CARDIOVERSION;  Surgeon: Adrian Prows, MD;  Location: [facility];  Service: Cardiovascular;  Laterality: N/A;  . CARPAL TUNNEL RELEASE Bilateral 2013  . CATARACT EXTRACTION Bilateral    Dr. [name]  . CATARACT EXTRACTION W/ INTRAOCULAR LENS  IMPLANT, BILATERAL  date?  . CORONARY ARTERY BYPASS GRAFT N/A 05/30/2018  . CORONARY BALLOON ANGIOPLASTY N/A 03/01/2018   Procedure: CORONARY BALLOON ANGIOPLASTY;  Surgeon: Adrian Prows, MD;  Location: [facility];  Service: Cardiovascular;  Laterality: N/A;  . CORONARY/GRAFT ACUTE MI REVASCULARIZATION N/A 03/01/2018   Procedure: Coronary/Graft Acute MI Revascularization;  Surgeon: Adrian Prows, MD;  Location: [employer] [facility];  Service: Cardiovascular;  Laterality: N/A;  . CYSTOSCOPY  07/19/2017   Procedure: CYSTOSCOPY;  Surgeon: Nickie Retort, MD;  Location: [facility];  Service: Urology;;  No seeds found in [place]  . EYE SURGERY Bilateral    Cat Sx OU.  Dislocated IOL sx OD.  Rheg. RD repair OS.  . EYE SURGERY    . GAS/FLUID EXCHANGE Right 06/13/2019   Procedure: GAS/FLUID EXCHANGE;  Surgeon: [name], MD;  Location: [facility];  Service: Ophthalmology;  Laterality: Right;  . LAMINECTOMY WITH POSTERIOR LATERAL ARTHRODESIS LEVEL 2 N/A 02/12/2014   Procedure: LUMBAR TWO-THREE,LUIMBAR THREE-FOUR LAMINECTOMY/FORAMINOTOMY;POSSIBLE POSTEROLATERAL ARTHRODESIS WITH AUTOGRAFT;  Surgeon: [name], MD;  Location: [facility];  Service: Neurosurgery;  Laterality: N/A;  . LEFT HEART CATH AND CORONARY ANGIOGRAPHY N/A 03/01/2018   Procedure: LEFT HEART CATH AND CORONARY ANGIOGRAPHY;  Surgeon: Adrian Prows, MD;  Location: [facility];  Service: Cardiovascular;  Laterality: N/A;  . MAZE N/A 05/30/2018   Procedure: MAZE;  Surgeon: [name], MD;  Location: [facility];  Service: Open Heart Surgery;  Laterality:  N/A;  . ORIF RIGHT ANKLE FX'S  05/05/2001   retained hardware  . PARS PLANA VITRECTOMY Right 06/13/2019   Procedure: PARS PLANA VITRECTOMY WITH 25 GAUGE WITH LASER AND GAS;  Surgeon: [name], MD;  Location: [facility];  Service: Ophthalmology;  Laterality: Right;  . PARS PLANA VITRECTOMY Right 09/12/2019   Procedure: [place] Vitrectomy With 25 Gauge;  Surgeon: [name], MD;  Location: [facility];  Service: Ophthalmology;  Laterality: Right;  . [place] INJECTION Right 06/13/2019   Procedure: PERFLUORONE INJECTION;  Surgeon: [name], MD;  Location: [facility];  Service: Ophthalmology;  Laterality: Right;  . PHOTOCOAGULATION WITH LASER Right 06/13/2019   Procedure: PHOTOCOAGULATION WITH LASER;  Surgeon: [name], MD;  Location: [facility];  Service: Ophthalmology;  Laterality: Right;  . PLACEMENT AND SUTURE OF SECONDARY INTRAOCULAR LENS Right 09/12/2019   Procedure: PLACEMENT AND SUTURE OF SECONDARY  INTRAOCULAR LENS;  Surgeon: [name], MD;  Location: [place];  Service: Ophthalmology;  Laterality: Right;  . PROSTATE BIOPSY  03-17-2017   dr Pilar Jarvis office  . RADIOACTIVE SEED IMPLANT N/A 07/19/2017   Procedure: RADIOACTIVE SEED IMPLANT/BRACHYTHERAPY IMPLANT;  Surgeon: Nickie Retort, MD;  Location: [facility];  Service: Urology;  Laterality: N/A;  69 seeds implanted  . REMOVAL RETAINED LENS Right 09/12/2019   Procedure: REMOVAL RETAINED LENS;  Surgeon: [name], MD;  Location: [place];  Service: Ophthalmology;  Laterality: Right;  . RETINAL DETACHMENT SURGERY Right 06/13/2019   Dr. Coralyn Pear  . SKIN GRAFT     face  . SPACE OAR INSTILLATION N/A 07/19/2017   Procedure: SPACE OAR INSTILLATION;  Surgeon: Nickie Retort, MD;  Location[facility];  Service: Urology;  Laterality: N/A;  . TEE WITHOUT CARDIOVERSION  03/20/2012   Procedure: TRANSESOPHAGEAL ECHOCARDIOGRAM (TEE);  Surgeon: Laverda Page, MD;  Location: [facility];  Service:  Cardiovascular;  Laterality: N/A;  normal LV; normal EF; normal RV; normal LA w/ left atrial appendage very small, normal function, interatrial septum intact without defect; normal RA; trace MR,TR, & PI; mild AV calcification and senile degeneration w/ mild stenosis, AVA 1.7cm^2;;   . TEE WITHOUT CARDIOVERSION N/A 05/30/2018   Procedure: TRANSESOPHAGEAL ECHOCARDIOGRAM (TEE);  Surgeon: [name], MD;  Location: [facility];  Service: Open Heart Surgery;  Laterality: N/A;  . TOTAL HIP ARTHROPLASTY Right 10/23/2017   Procedure: TOTAL HIP ARTHROPLASTY ANTERIOR APPROACH;  Surgeon: Frederik Pear, MD;  Location: [facility];  Service: Orthopedics;  Laterality: Right;  . TRANSTHORACIC ECHOCARDIOGRAM  01-11-2017   dr [name]  (per echo note, no significant change in seveity of AS, no other diagnostic change)   moderate concentric LVH, ef 33%, grade 1 diastolic dysfunction/  moderate LAE/  mild , grade 1 AR w/ moderate AV calcification, mild to moderate restricted AV leaflets w/ moderate AS, AVA 1.16cm^2, peak grandient 76mmHg, mean grandient 77mmHg/  trace MR, mild calcification MV annulus , mild MV leaflet calcification, mild MVS, peak grandient 4.56mmHg, mean grandient 2.72mmHg/ trace TR    Social History   Socioeconomic History  . Marital status: Married    Spouse name: Butch Penny  . Number of children: 2  . Years of education: 63  . Highest education level: Not on file  Occupational History  . Not on file  Tobacco Use  . Smoking status: Former Smoker    Packs/day: 0.25    Years: 1.00    Pack years: 0.25    Types: Cigarettes    Quit date: 02/04/1967    Years since quitting: 53.6  . Smokeless tobacco: Never Used  . Tobacco comment: smoked 1 q2-3 days  Vaping Use  . Vaping Use: Never used  Substance and Sexual Activity  . Alcohol use: Yes    Comment: occasional beer  . Drug use: No  . Sexual activity: Yes  Other Topics Concern  . Not on file  Social History Narrative   Patient is married Butch Penny).    Patient has two children.   Patient is retired.   Patient has a college education.   Patient is right-handed.   Patient drinks two cups of coffee per day and 2-3 cups of Diet soda daily and limited tea.         Social Determinants of Health   Financial Resource Strain: Not on file  Food Insecurity: Not on file  Transportation Needs: Not on file  Physical Activity:  Not on file  Stress: Not on file  Social Connections: Not on file  Intimate Partner Violence: Not on file     No Known Allergies   Outpatient Medications Prior to Visit  Medication Sig Dispense Refill  . amiodarone (PACERONE) 200 MG tablet Take 200 mg by mouth daily.    Marland Kitchen atorvastatin (LIPITOR) 80 MG tablet TAKE 1 TABLET BY MOUTH  DAILY 90 tablet 3  . colesevelam (WELCHOL) 625 MG tablet Take 625 mg by mouth 3 (three) times daily.     Marland Kitchen donepezil (ARICEPT) 10 MG tablet Take 10 mg by mouth every morning.     Marland Kitchen esomeprazole (NEXIUM) 40 MG capsule Take 40 mg by mouth daily.     . ferrous sulfate 325 (65 FE) MG tablet Take 1 tablet (325 mg total) by mouth daily with breakfast. 90 tablet 3  . gabapentin (NEURONTIN) 300 MG capsule     . insulin glargine (LANTUS) 100 UNIT/ML injection Inject 20-40 Units into the skin at bedtime as needed (BGL below 140-150 take 20 units, if 151 or higher take 40 units).    . memantine (NAMENDA) 5 MG tablet Take 5 mg by mouth 2 (two) times daily.     . metoprolol tartrate (LOPRESSOR) 25 MG tablet TAKE 1 TABLET BY MOUTH  DAILY (Patient taking differently: Take 12.5 mg by mouth 2 (two) times daily.) 90 tablet 3  . mirabegron ER (MYRBETRIQ) 50 MG TB24 tablet Take 50 mg by mouth at bedtime.     Marland Kitchen omega-3 acid ethyl esters (LOVAZA) 1 G capsule Take 2 g by mouth daily.    . prednisoLONE acetate (PRED FORTE) 1 % ophthalmic suspension Place 1 drop into the right eye daily. 15 mL 0  . rOPINIRole (REQUIP) 2 MG tablet Take 2 mg by mouth 3 (three) times daily as needed (RLS).     Marland Kitchen spironolactone  (ALDACTONE) 25 MG tablet TAKE ONE-HALF TABLET BY  MOUTH DAILY 45 tablet 3  . tamsulosin (FLOMAX) 0.4 MG CAPS capsule Take 0.4 mg by mouth daily after supper.     . valsartan-hydrochlorothiazide (DIOVAN-HCT) 80-12.5 MG tablet TAKE 1 TABLET BY MOUTH IN  THE MORNING 90 tablet 3  . vitamin B-12 (CYANOCOBALAMIN) 1000 MCG tablet Take 1,000 mcg by mouth 2 (two) times daily.    Alveda Reasons 15 MG TABS tablet TAKE 1 TABLET BY MOUTH IN  THE EVENING AFTER DINNER 90 tablet 3  . torsemide (DEMADEX) 20 MG tablet Take 1 tablet (20 mg total) by mouth 2 (two) times daily as needed (Leg swelling). 90 tablet 3   No facility-administered medications prior to visit.    Review of Systems  Constitutional: Negative for chills, fever, malaise/fatigue and weight loss.  HENT: Negative for hearing loss, sore throat and tinnitus.   Eyes: Negative for blurred vision and double vision.  Respiratory: Negative for cough, hemoptysis, sputum production, shortness of breath, wheezing and stridor.   Cardiovascular: Negative for chest pain, palpitations, orthopnea, leg swelling and PND.  Gastrointestinal: Negative for abdominal pain, constipation, diarrhea, heartburn, nausea and vomiting.  Genitourinary: Negative for dysuria, hematuria and urgency.  Musculoskeletal: Negative for joint pain and myalgias.  Skin: Negative for itching and rash.  Neurological: Negative for dizziness, tingling, weakness and headaches.  Endo/Heme/Allergies: Negative for environmental allergies. Does not bruise/bleed easily.  Psychiatric/Behavioral: Negative for depression. The patient is not nervous/anxious and does not have insomnia.   All other systems reviewed and are negative.    Objective:  Physical Exam Vitals reviewed.  Constitutional:      General: He is not in acute distress.    Appearance: He is well-developed and well-nourished. He is obese.  HENT:     Head: Normocephalic and atraumatic.     Mouth/Throat:     Mouth: Oropharynx is  clear and moist.  Eyes:     General: No scleral icterus.    Conjunctiva/sclera: Conjunctivae normal.     Pupils: Pupils are equal, round, and reactive to light.  Neck:     Vascular: No JVD.     Trachea: No tracheal deviation.  Cardiovascular:     Rate and Rhythm: Normal rate and regular rhythm.     Pulses: Intact distal pulses.     Heart sounds: Normal heart sounds. No murmur heard.   Pulmonary:     Effort: Pulmonary effort is normal. No tachypnea, accessory muscle usage or respiratory distress.     Breath sounds: Normal breath sounds. No stridor. No wheezing, rhonchi or rales.  Abdominal:     General: Bowel sounds are normal. There is no distension.     Palpations: Abdomen is soft.     Tenderness: There is no abdominal tenderness.  Musculoskeletal:        General: No tenderness or edema.     Cervical back: Neck supple.  Lymphadenopathy:     Cervical: No cervical adenopathy.  Skin:    General: Skin is warm and dry.     Capillary Refill: Capillary refill takes less than 2 seconds.     Findings: No rash.  Neurological:     Mental Status: He is alert and oriented to person, place, and time.  Psychiatric:        Mood and Affect: Mood and affect normal.        Behavior: Behavior normal.      Vitals:   09/21/20 1338  BP: 140/62  Pulse: 62  Temp: (!) 97.4 F (36.3 C)  TempSrc: Temporal  SpO2: 98%  Weight: 216 lb (98 kg)  Height: 5\' 8"  (1.727 m)   98% on RA BMI Readings from Last 3 Encounters:  09/21/20 32.84 kg/m  09/04/20 32.05 kg/m  07/16/20 33.01 kg/m   Wt Readings from Last 3 Encounters:  09/21/20 216 lb (98 kg)  09/04/20 210 lb 12.8 oz (95.6 kg)  07/16/20 217 lb 2 oz (98.5 kg)     CBC    Component Value Date/Time   WBC 6.3 09/15/2020 1154   WBC 5.3 07/02/2020 0934   WBC 5.6 09/12/2019 0956   RBC 3.72 (L) 09/15/2020 1154   RBC 3.29 (L) 07/02/2020 0934   HGB 10.6 (L) 09/15/2020 1154   HCT 32.7 (L) 09/15/2020 1154   PLT 191 09/15/2020 1154   MCV  88 09/15/2020 1154   MCH 28.5 09/15/2020 1154   MCH 28.9 07/02/2020 0934   MCHC 32.4 09/15/2020 1154   MCHC 32.5 07/02/2020 0934   RDW 14.5 09/15/2020 1154   LYMPHSABS 0.9 07/02/2020 0934   MONOABS 0.5 07/02/2020 0934   EOSABS 0.3 07/02/2020 0934   BASOSABS 0.1 07/02/2020 0934     Chest Imaging:  CT chest 09/17/2020: Resolution of the focal groundglass opacity.  Small subtle areas within the left lower lobe possible underlying scarring.  No concerning mediastinal adenopathy. The patient's images have been independently reviewed by me.     Pulmonary Functions Testing Results: PFT Results Latest Ref Rng & Units 03/19/2018  FVC-Pre L 1.73  FVC-Predicted Pre % 42  FVC-Post L 1.65  FVC-Predicted Post %  40  Pre FEV1/FVC % % 80  Post FEV1/FCV % % 87  FEV1-Pre L 1.38  FEV1-Predicted Pre % 47  FEV1-Post L 1.43  DLCO uncorrected ml/min/mmHg 12.22  DLCO UNC% % 39  DLCO corrected ml/min/mmHg 15.93  DLCO COR %Predicted % 51  DLVA Predicted % 102  TLC L 4.09  TLC % Predicted % 59  RV % Predicted % 90    FeNO:  Pathology:   Echocardiogram:   Heart Catheterization:     Assessment & Plan:     ICD-10-CM   1. Ground glass opacity present on imaging of lung  R91.8 CT CHEST HIGH RESOLUTION    Pulmonary Function Test  2. Interstitial pulmonary disease ([name])  J84.9 CT CHEST HIGH RESOLUTION    Pulmonary Function Test  3. Restrictive lung disease  J98.4 CT CHEST HIGH RESOLUTION    Pulmonary Function Test  4. Primary hypertension  I10   5. Former smoker  Z87.891   17. Pipe smoker  F17.290   7. S/P CABG x 1  Z95.1   8. S/P aortic valve replacement with bioprosthetic valve  Z95.3   9. Decreased diffusion capacity of lung  R94.2     Discussion:  80 year old gentleman, groundglass opacity of left lower lobe, now resolved.  Follow-up CT imaging does reveal a abnormalities peripherally with areas of groundglass and some septal thickening.  Prior pulmonary function tests with normal  ratio reduced FVC and FEV1, reduced DLCO evidence of restrictive lung disease.  I think we need to follow his CAT scan closely as he may have a start or development of interstitial lung disease.  Mainly based on his PFTs.  He has had prior thoracic surgery which may explain some of the restriction but his values are relatively low.  Plan: HRCT in 9 months. Pulmonary function test to be completed prior to next office visit. Follow-up in clinic after CT imaging and PFTs are complete December 2022.   Current Outpatient Medications:  .  amiodarone (PACERONE) 200 MG tablet, Take 200 mg by mouth daily., Disp: , Rfl:  .  atorvastatin (LIPITOR) 80 MG tablet, TAKE 1 TABLET BY MOUTH  DAILY, Disp: 90 tablet, Rfl: 3 .  colesevelam (WELCHOL) 625 MG tablet, Take 625 mg by mouth 3 (three) times daily. , Disp: , Rfl:  .  donepezil (ARICEPT) 10 MG tablet, Take 10 mg by mouth every morning. , Disp: , Rfl:  .  esomeprazole (NEXIUM) 40 MG capsule, Take 40 mg by mouth daily. , Disp: , Rfl:  .  ferrous sulfate 325 (65 FE) MG tablet, Take 1 tablet (325 mg total) by mouth daily with breakfast., Disp: 90 tablet, Rfl: 3 .  gabapentin (NEURONTIN) 300 MG capsule, , Disp: , Rfl:  .  insulin glargine (LANTUS) 100 UNIT/ML injection, Inject 20-40 Units into the skin at bedtime as needed (BGL below 140-150 take 20 units, if 151 or higher take 40 units)., Disp: , Rfl:  .  memantine (NAMENDA) 5 MG tablet, Take 5 mg by mouth 2 (two) times daily. , Disp: , Rfl:  .  metoprolol tartrate (LOPRESSOR) 25 MG tablet, TAKE 1 TABLET BY MOUTH  DAILY (Patient taking differently: Take 12.5 mg by mouth 2 (two) times daily.), Disp: 90 tablet, Rfl: 3 .  mirabegron ER (MYRBETRIQ) 50 MG TB24 tablet, Take 50 mg by mouth at bedtime. , Disp: , Rfl:  .  omega-3 acid ethyl esters (LOVAZA) 1 G capsule, Take 2 g by mouth daily., Disp: , Rfl:  .  prednisoLONE acetate (PRED FORTE) 1 % ophthalmic suspension, Place 1 drop into the right eye daily., Disp: 15  mL, Rfl: 0 .  rOPINIRole (REQUIP) 2 MG tablet, Take 2 mg by mouth 3 (three) times daily as needed (RLS). , Disp: , Rfl:  .  spironolactone (ALDACTONE) 25 MG tablet, TAKE ONE-HALF TABLET BY  MOUTH DAILY, Disp: 45 tablet, Rfl: 3 .  tamsulosin (FLOMAX) 0.4 MG CAPS capsule, Take 0.4 mg by mouth daily after supper. , Disp: , Rfl:  .  valsartan-hydrochlorothiazide (DIOVAN-HCT) 80-12.5 MG tablet, TAKE 1 TABLET BY MOUTH IN  THE MORNING, Disp: 90 tablet, Rfl: 3 .  vitamin B-12 (CYANOCOBALAMIN) 1000 MCG tablet, Take 1,000 mcg by mouth 2 (two) times daily., Disp: , Rfl:  .  XARELTO 15 MG TABS tablet, TAKE 1 TABLET BY MOUTH IN  THE EVENING AFTER DINNER, Disp: 90 tablet, Rfl: 3 .  torsemide (DEMADEX) 20 MG tablet, Take 1 tablet (20 mg total) by mouth 2 (two) times daily as needed (Leg swelling)., Disp: 90 tablet, Rfl: 3    Garner Nash, DO [facility] 09/21/2020 1:42 PM

## 2020-09-21 NOTE — Patient Instructions (Addendum)
Thank you for visiting Dr. Valeta Harms at [facility]. Today we recommend the following:  Orders Placed This Encounter  Procedures  . CT CHEST HIGH RESOLUTION  . Pulmonary Function Test   Return in about 10 months (around 07/21/2021) for with Dr. Valeta Harms . after CT and PFTs complete    Please do your part to reduce the spread of COVID-19.

## 2020-10-07 ENCOUNTER — Encounter (INDEPENDENT_AMBULATORY_CARE_PROVIDER_SITE_OTHER): Payer: Self-pay | Admitting: Ophthalmology

## 2020-10-07 ENCOUNTER — Other Ambulatory Visit: Payer: Self-pay

## 2020-10-07 ENCOUNTER — Ambulatory Visit (INDEPENDENT_AMBULATORY_CARE_PROVIDER_SITE_OTHER): Payer: Medicare Other | Admitting: Ophthalmology

## 2020-10-07 DIAGNOSIS — T8522XS Displacement of intraocular lens, sequela: Secondary | ICD-10-CM | POA: Diagnosis not present

## 2020-10-07 DIAGNOSIS — H2701 Aphakia, right eye: Secondary | ICD-10-CM

## 2020-10-07 DIAGNOSIS — H3321 Serous retinal detachment, right eye: Secondary | ICD-10-CM

## 2020-10-07 DIAGNOSIS — Z9889 Other specified postprocedural states: Secondary | ICD-10-CM

## 2020-10-07 DIAGNOSIS — H35411 Lattice degeneration of retina, right eye: Secondary | ICD-10-CM

## 2020-10-07 DIAGNOSIS — H35351 Cystoid macular degeneration, right eye: Secondary | ICD-10-CM

## 2020-10-07 DIAGNOSIS — I10 Essential (primary) hypertension: Secondary | ICD-10-CM

## 2020-10-07 DIAGNOSIS — H35033 Hypertensive retinopathy, bilateral: Secondary | ICD-10-CM

## 2020-10-07 DIAGNOSIS — H3581 Retinal edema: Secondary | ICD-10-CM

## 2020-10-07 DIAGNOSIS — Z961 Presence of intraocular lens: Secondary | ICD-10-CM

## 2020-10-07 DIAGNOSIS — E119 Type 2 diabetes mellitus without complications: Secondary | ICD-10-CM

## 2020-10-07 NOTE — Progress Notes (Signed)
[facility] Note  10/07/2020     CHIEF COMPLAINT Patient presents for Retina Follow Up   HISTORY OF PRESENT ILLNESS: Cody Phelps is a 80 y.o. male who presents to the clinic today for:  HPI    Retina Follow Up    Patient presents with  Other.  In right eye.  This started weeks ago.  Severity is moderate.  Duration of weeks.  Since onset it is stable.  I, the attending physician,  performed the HPI with the patient and updated documentation appropriately.          Comments    Pt states vision is about the same OU.  Pt denies eye pain or discomfort.  Pt denies any new or worsening floaters or fol OU.       Last edited by [name], MD on 10/07/2020  5:13 PM. (History)    pt states his vision seems to have cleared up, he is using PF and Laren Boom  Referring physician: [name], MD [place],  [place] [address]  HISTORICAL INFORMATION:   Selected notes from the [place] Referred by Dr. [name] for concern of RD OD   CURRENT MEDICATIONS: Current Outpatient Medications (Ophthalmic Drugs)  Medication Sig  . prednisoLONE acetate (PRED FORTE) 1 % ophthalmic suspension Place 1 drop into the right eye daily.   No current facility-administered medications for this visit. (Ophthalmic Drugs)   Current Outpatient Medications (Other)  Medication Sig  . amiodarone (PACERONE) 200 MG tablet Take 200 mg by mouth daily.  Marland Kitchen atorvastatin (LIPITOR) 80 MG tablet TAKE 1 TABLET BY MOUTH  DAILY  . colesevelam (WELCHOL) 625 MG tablet Take 625 mg by mouth 3 (three) times daily.   Marland Kitchen donepezil (ARICEPT) 10 MG tablet Take 10 mg by mouth every morning.   Marland Kitchen esomeprazole (NEXIUM) 40 MG capsule Take 40 mg by mouth daily.   . ferrous sulfate 325 (65 FE) MG tablet Take 1 tablet (325 mg total) by mouth daily with breakfast.  . gabapentin (NEURONTIN) 300 MG capsule   . insulin glargine (LANTUS) 100 UNIT/ML injection Inject 20-40 Units  into the skin at bedtime as needed (BGL below 140-150 take 20 units, if 151 or higher take 40 units).  . memantine (NAMENDA) 5 MG tablet Take 5 mg by mouth 2 (two) times daily.   . metoprolol tartrate (LOPRESSOR) 25 MG tablet TAKE 1 TABLET BY MOUTH  DAILY (Patient taking differently: Take 12.5 mg by mouth 2 (two) times daily.)  . mirabegron ER (MYRBETRIQ) 50 MG TB24 tablet Take 50 mg by mouth at bedtime.   Marland Kitchen omega-3 acid ethyl esters (LOVAZA) 1 G capsule Take 2 g by mouth daily.  Marland Kitchen rOPINIRole (REQUIP) 2 MG tablet Take 2 mg by mouth 3 (three) times daily as needed (RLS).   Marland Kitchen spironolactone (ALDACTONE) 25 MG tablet TAKE ONE-HALF TABLET BY  MOUTH DAILY  . tamsulosin (FLOMAX) 0.4 MG CAPS capsule Take 0.4 mg by mouth daily after supper.   . torsemide (DEMADEX) 20 MG tablet Take 1 tablet (20 mg total) by mouth 2 (two) times daily as needed (Leg swelling).  . valsartan-hydrochlorothiazide (DIOVAN-HCT) 80-12.5 MG tablet TAKE 1 TABLET BY MOUTH IN  THE MORNING  . vitamin B-12 (CYANOCOBALAMIN) 1000 MCG tablet Take 1,000 mcg by mouth 2 (two) times daily.  Alveda Reasons 15 MG TABS tablet TAKE 1 TABLET BY MOUTH IN  THE EVENING AFTER DINNER   No current facility-administered  medications for this visit. (Other)      REVIEW OF SYSTEMS: ROS    Positive for: Musculoskeletal, Endocrine, Cardiovascular, Eyes, Respiratory   Negative for: Constitutional, Gastrointestinal, Neurological, Skin, Genitourinary, HENT, Psychiatric, Allergic/Imm, Heme/Lymph   Last edited by Doneen Poisson on 10/07/2020  2:47 PM. (History)       ALLERGIES No Known Allergies  PAST MEDICAL HISTORY Past Medical History:  Diagnosis Date  . Acute anterior wall MI ([place]) 03/01/2018  . Acute combined systolic and diastolic heart failure ([name]) 03/15/2018  . Burn   . Chronic diastolic CHF (congestive heart failure) ([name]) 09/21/2018  . Defect, retina, with detachment   . Dementia ([name])   . Diverticulosis   . ED (erectile dysfunction)   .  First degree heart block   . GERD (gastroesophageal reflux disease)   . Heart murmur   . History of blood transfusion    with open heart surgery  . Hypertension    cardiologsit-  dr [name]  . Hypertensive retinopathy    OU  . Iron deficiency anemia   . Mixed hyperlipidemia   . Morbid obesity ([place])   . Myocardial infarction ([place])    02/28/18  . OA (osteoarthritis)    right hip  . OSA on CPAP    followed by dr dohmeier, uses CPAP  . PAF (paroxysmal atrial fibrillation) ([name])    a. on Eliquis  . Prostate cancer ([name])    dx 03-17-2017 (bx)  Stage T2a, Gleason 4+4, PSA  4.43, vol 32.32--  s/p radiatctive prostate seed implants 07-10-2017 then IMRT and ADT  . Retinal detachment    Rheg. RD OS  . RLS (restless legs syndrome)   . S/P aortic valve replacement with bioprosthetic valve 05/30/2018   23 mm Edwards Inspiris Resilia stented bovine pericardial tissue valve  . 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 S/P [place] operation for atrial fibrillation 05/30/2018   Complete bilateral atrial lesion set using bipolar radiofrequency and cryothermy ablation with clipping of LA appendage  . Scoliosis   . Trigger finger   . Type 2 diabetes mellitus ([place])   . Wears partial dentures    upper and lower   Past Surgical History:  Procedure Laterality Date  . AORTIC VALVE REPLACEMENT N/A 05/30/2018   Procedure: AORTIC VALVE REPLACEMENT (AVR) using Inspiris Valve, Size 23;  Surgeon: [name], MD;  Location: [facility];  Service: Open Heart Surgery;  Laterality: N/A;  . APPENDECTOMY  1988  . BILATERAL TOTAL ETHMOIDECTOMY AND SPHENOIDECTOMY  01-14-2009    dr bates   [facility]  . CARDIOVERSION N/A 08/10/2018   Procedure: CARDIOVERSION;  Surgeon: Adrian Prows, MD;  Location: [facility];  Service: Cardiovascular;  Laterality: N/A;  . CARPAL TUNNEL RELEASE Bilateral 2013  . CATARACT EXTRACTION Bilateral    Dr. [name]  . CATARACT EXTRACTION W/ INTRAOCULAR LENS  IMPLANT, BILATERAL  date?  .  CORONARY ARTERY BYPASS GRAFT N/A 05/30/2018  . CORONARY BALLOON ANGIOPLASTY N/A 03/01/2018   Procedure: CORONARY BALLOON ANGIOPLASTY;  Surgeon: Adrian Prows, MD;  Location: [place];  Service: Cardiovascular;  Laterality: N/A;  . CORONARY/GRAFT ACUTE MI REVASCULARIZATION N/A 03/01/2018   Procedure: Coronary/Graft Acute MI Revascularization;  Surgeon: Adrian Prows, MD;  Location: [facility];  Service: Cardiovascular;  Laterality: N/A;  . CYSTOSCOPY  07/19/2017   Procedure: CYSTOSCOPY;  Surgeon: Nickie Retort, MD;  Location: [facility];  Service: Urology;;  No seeds found in [place]  .  EYE SURGERY Bilateral    Cat Sx OU.  Dislocated IOL sx OD.  Rheg. RD repair OS.  . EYE SURGERY    . GAS/FLUID EXCHANGE Right 06/13/2019   Procedure: GAS/FLUID EXCHANGE;  Surgeon: [name], MD;  Location: [facility];  Service: Ophthalmology;  Laterality: Right;  . LAMINECTOMY WITH POSTERIOR LATERAL ARTHRODESIS LEVEL 2 N/A 02/12/2014   Procedure: LUMBAR TWO-THREE,LUIMBAR THREE-FOUR LAMINECTOMY/FORAMINOTOMY;POSSIBLE POSTEROLATERAL ARTHRODESIS WITH AUTOGRAFT;  Surgeon: [name], MD;  Location: [facility];  Service: Neurosurgery;  Laterality: N/A;  . LEFT HEART CATH AND CORONARY ANGIOGRAPHY N/A 03/01/2018   Procedure: LEFT HEART CATH AND CORONARY ANGIOGRAPHY;  Surgeon: Adrian Prows, MD;  Location: [facility];  Service: Cardiovascular;  Laterality: N/A;  . MAZE N/A 05/30/2018   Procedure: MAZE;  Surgeon: [name], MD;  Location: [facility];  Service: Open Heart Surgery;  Laterality: N/A;  . ORIF RIGHT ANKLE FX'S  05/05/2001   retained hardware  . PARS PLANA VITRECTOMY Right 06/13/2019   Procedure: PARS PLANA VITRECTOMY WITH 25 GAUGE WITH LASER AND GAS;  Surgeon: [name], MD;  Location: [facility];  Service: Ophthalmology;  Laterality: Right;  . PARS PLANA VITRECTOMY Right 09/12/2019   Procedure: [place] Vitrectomy With 25 Gauge;  Surgeon: [name], MD;  Location: [facility];  Service: Ophthalmology;  Laterality: Right;  . [place] INJECTION Right 06/13/2019   Procedure: PERFLUORONE INJECTION;  Surgeon: [name], MD;  Location: [facility];  Service: Ophthalmology;  Laterality: Right;  . PHOTOCOAGULATION WITH LASER Right 06/13/2019   Procedure: PHOTOCOAGULATION WITH LASER;  Surgeon: [name], MD;  Location: [facility];  Service: Ophthalmology;  Laterality: Right;  . PLACEMENT AND SUTURE OF SECONDARY INTRAOCULAR LENS Right 09/12/2019   Procedure: PLACEMENT AND SUTURE OF SECONDARY INTRAOCULAR LENS;  Surgeon: [name], MD;  Location: [facility];  Service: Ophthalmology;  Laterality: Right;  . PROSTATE BIOPSY  03-17-2017   dr Pilar Jarvis office  . RADIOACTIVE SEED IMPLANT N/A 07/19/2017   Procedure: RADIOACTIVE SEED IMPLANT/BRACHYTHERAPY IMPLANT;  Surgeon: Nickie Retort, MD;  Location: [facility];  Service: Urology;  Laterality: N/A;  69 seeds implanted  . REMOVAL RETAINED LENS Right 09/12/2019   Procedure: REMOVAL RETAINED LENS;  Surgeon: [name], MD;  Location: [facility];  Service: Ophthalmology;  Laterality: Right;  . RETINAL DETACHMENT SURGERY Right 06/13/2019   Dr. Coralyn Pear  . SKIN GRAFT     face  . SPACE OAR INSTILLATION N/A 07/19/2017   Procedure: SPACE OAR INSTILLATION;  Surgeon: Nickie Retort, MD;  Location: [facility];  Service: Urology;  Laterality: N/A;  . TEE WITHOUT CARDIOVERSION  03/20/2012   Procedure: TRANSESOPHAGEAL ECHOCARDIOGRAM (TEE);  Surgeon: Laverda Page, MD;  Location: [facility];  Service: Cardiovascular;  Laterality: N/A;  normal LV; normal EF; normal RV; normal LA w/ left atrial appendage very small, normal function, interatrial septum intact without defect; normal RA; trace MR,TR, & PI; mild AV calcification and senile degeneration w/ mild stenosis, AVA 1.7cm^2;;   . TEE WITHOUT CARDIOVERSION N/A 05/30/2018   Procedure: TRANSESOPHAGEAL ECHOCARDIOGRAM (TEE);  Surgeon: [name], MD;   Location: [facility];  Service: Open Heart Surgery;  Laterality: N/A;  . TOTAL HIP ARTHROPLASTY Right 10/23/2017   Procedure: TOTAL HIP ARTHROPLASTY ANTERIOR APPROACH;  Surgeon: Frederik Pear, MD;  Location: [facility];  Service: Orthopedics;  Laterality: Right;  . TRANSTHORACIC ECHOCARDIOGRAM  01-11-2017   dr [name]  (per echo note, no significant change in seveity of AS,  no other diagnostic change)   moderate concentric LVH, ef 16%, grade 1 diastolic dysfunction/  moderate LAE/  mild , grade 1 AR w/ moderate AV calcification, mild to moderate restricted AV leaflets w/ moderate AS, AVA 1.16cm^2, peak grandient 233mHg, mean grandient 340mg/  trace MR, mild calcification MV annulus , mild MV leaflet calcification, mild MVS, peak grandient 4.33m333m, mean grandient 2.7mm17m trace TR    FAMILY HISTORY Family History  Problem Relation Age of Onset  . Stroke Mother   . Hypertension Mother   . Heart attack Father   . Heart murmur Father   . Hypertension Sister   . Lung disease Brother   . Colon cancer Neg Hx   . Stomach cancer Neg Hx   . Rectal cancer Neg Hx   . Pancreatic cancer Neg Hx     SOCIAL HISTORY Social History   Tobacco Use  . Smoking status: Former Smoker    Packs/day: 0.25    Years: 1.00    Pack years: 0.25    Types: Cigarettes    Quit date: 02/04/1967    Years since quitting: 53.7  . Smokeless tobacco: Never Used  . Tobacco comment: smoked 1 q2-3 days  Vaping Use  . Vaping Use: Never used  Substance Use Topics  . Alcohol use: Yes    Comment: occasional beer  . Drug use: No         OPHTHALMIC EXAM:  Base Eye Exam    Visual Acuity (Snellen - Linear)      Right Left   Dist cc 20/40 -2 20/20 -1   Dist ph cc 20/30 -1    Correction: Glasses       Tonometry (Tonopen, 2:52 PM)      Right Left   Pressure 15 18       Pupils      Dark Light Shape React APD   Right 4 3 Round Brisk 0   Left 3 2 Round Brisk 0       Visual Fields      Left Right    Full Full        Extraocular Movement      Right Left    Full Full       Neuro/Psych    Oriented x3: Yes   Mood/Affect: Normal       Dilation    Both eyes: 1.0% Mydriacyl, 2.5% Phenylephrine @ 2:52 PM        Slit Lamp and Fundus Exam    Slit Lamp Exam      Right Left   Lids/Lashes mild Meibomian gland dysfunction, mild Telangiectasia Dermatochalasis - upper lid, Meibomian gland dysfunction, Edema, Erythema lower lid, positive concretion left palp conj   Conjunctiva/Sclera White and quiet; residual STK in ST quadrant Temporal Pinguecula   Cornea well healed superior corneal wound, 3+Punctate epithelial erosions, Arcus, mild tear film debris, fine endo pigment 1+ Punctate epithelial erosions, +verticellata, trace endo pigment, Debris in tear film   Anterior Chamber Deep, trace pigment Deep and quiet   Iris Round and dilated to 6mm,54mattered Transillumination defects 360, +iridodenesis Round and dilated   Lens Sutured Akreos IOL well centered  3 piece Posterior chamber intraocular in good position, trace Posterior capsular opacification   Vitreous post vitrectomy, trace pigment Vitreous syneresis, Posterior vitreous detachment       Fundus Exam      Right Left   Disc 1+Pallor, Sharp rim 2+Pallor, Sharp rim, temporal PPP, mild tilt   C/D  Ratio 0.3 0.5   Macula Blunted foveal reflex, mild RPE mottling and clumping, interval improvement in central cystic changes, scattered MA Flat, Blunted foveal reflex, Retinal pigment epithelial mottling and clumping, No heme or edema   Vessels Mild Vascular attenuation, mild Tortuousity, mild A/V crossing changes Vascular attenuation, mild Tortuous   Periphery attached, good 360 laser in place; ORIGINALLY: bullous superior retinal detachment from 1000-0130, another more shallow detachment lobe from 130-400, lattice and micro tears ST quadrant; Old retinal tear at 0900 with surrounding laser Attached, peripheral laser scars at 1030, No new RT/RD        Refraction     Wearing Rx      Sphere Cylinder Axis Add   Right -1.25 +1.50 025 +2.75   Left -1.00 +1.75 180 +2.75          IMAGING AND PROCEDURES  Imaging and Procedures for _0 @  OCT, Retina - OU - Both Eyes       Right Eye Quality was good. Central Foveal Thickness: 382. Progression has improved. Findings include normal foveal contour, no SRF, intraretinal fluid (Interval improvement of IRF/CME Nasal fovea; trace ERM).   Left Eye Quality was good. Central Foveal Thickness: 270. Progression has been stable. Findings include normal foveal contour, no IRF, no SRF.   Notes *Images captured and stored on drive  Diagnosis / Impression:  OD: Interval improvement in IRF/CME Nasal fovea; trace ERM OS: NFP, no IRF/SRF   Clinical management:  See below  Abbreviations: NFP - Normal foveal profile. CME - cystoid macular edema. PED - pigment epithelial detachment. IRF - intraretinal fluid. SRF - subretinal fluid. EZ - ellipsoid zone. ERM - epiretinal membrane. ORA - outer retinal atrophy. ORT - outer retinal tubulation. SRHM - subretinal hyper-reflective material                 ASSESSMENT/PLAN:    ICD-10-CM   1. Dislocation of intraocular lens, sequela  T85.22XS   2. Aphakia of right eye  H27.01   3. Right retinal detachment  H33.21   4. 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 Retinal edema  H35.81 OCT, Retina - OU - Both Eyes  6. History of repair of retinal tear by laser photocoagulation  Z98.890   7. Diabetes mellitus type 2 without retinopathy ([place])  E11.9   8. Essential hypertension  I10   9. Hypertensive retinopathy of both eyes  H35.033   10. Pseudophakia of both eyes  Z96.1   11. Cystoid macular edema of right eye  H35.351    1,2. Dislocated IOL OD  - pt with history of partially dislocated 3-piece IOL -- spontaneously dislocated completely into vitreous cavity on 1.23.21  - s/p 25g PPV w/ IOL explantation and secondary sutured IOL OD -- Akreos AO60 lens,  17.5D -- 02.11.2021             - IOL in good position  - 2 superior nylon sutures removed at slit lamp 03.12.21 -- last nylon suture removed at slit lamp, 04.30.21  - corneal edema resolved; PEE improved             - s/p STK OD #1 (07.09.21), #2 (01.26.22) for CME  - BCVA 20/30 from 20/40  - OCT shows interval improvement in IRF/CME (see #11 below)  - IOP okay at 15  - ATs QID OU              - f/u 4-6 wks months -- DFE/OCT   3-5.  Rhegmatogenous retinal detachment, right eye  - bullous, superior, mac off detachment, onset of foveal involvement 11.11.20 by history  - Bi-lobed superior detachment, superior lobe spanning 1000-0130, nasal lobe 0130-0400  - lattice degeneration with microtears noted at 1030  - s/p PPV/PFC/EL/FAX/14% C3F8 OD, 11.12.20             - intraop: HST at 2 oclock was found; also detachment had progressed to subtotal detachment spanning 9 oclock to 6 oclock (going in clockwise direction); also severe zonular insufficiency with IOL very mobile throughout case -- did not dislocate completely during the case or immediately post op  - doing well              - retina attached and in good position--good laser in place  6. History of retinal defects s/p laser retinopexy OU with Dr. Zigmund Daniel in 2013  - OD laser at 0900  - OS laser at 1030  - stable  7. Diabetes mellitus, type 2 without retinopathy  - The incidence, risk factors for progression, natural history and treatment options for diabetic retinopathy  were discussed with patient.    - The need for close monitoring of blood glucose, blood pressure, and serum lipids, avoiding cigarette or any type of tobacco, and the need for long term follow up was also discussed with patient  8,9. Hypertensive retinopathy OU  - discussed importance of tight BP control  - monitor  10. Pseudophakia OU  - s/p CE/IOL OU (Dr. [name])  - 3 piece IOL OD was completely displaced into vitreous -- now with sutured Akreos IOL in good  position OD -- see above  - OS 3-piece IOL in good position  - monitor  11. CME OD  - post op edema  - mild worsening on PF and prolensa QID OD  - s/p STK OD #1 07.09.21, #2 (01.26.22)  - interval improvement in IRF/CME on OCT today 03.09.22  - continue PF and prolensa qdaily OD -- increase both to BID OD  - f/u 4-6 weeks, DFE, OCT  Ophthalmic Meds Ordered this visit:  No orders of the defined types were placed in this encounter.      Return for f/u 4-6 weeks, CME OD, DFE, OCT.  There are no Patient Instructions on file for this visit.   Explained the diagnoses, plan, and follow up with the patient and they expressed understanding.  Patient expressed understanding of the importance of proper follow up care.  This document serves as a record of services personally performed by Gardiner Sleeper, MD, PhD. It was created on their behalf by Roselee Nova, COMT. The creation of this record is the provider's dictation and/or activities during the visit.  Electronically signed by: Roselee Nova, COMT 10/07/20 5:16 PM   This document serves as a record of services personally performed by Gardiner Sleeper, MD, PhD. It was created on their behalf by San Jetty. Owens Shark, OA an ophthalmic technician. The creation of this record is the provider's dictation and/or activities during the visit.    Electronically signed by: San Jetty. Owens Shark, [place] 03.09.2022 5:16 PM   Gardiner Sleeper, M.D., Ph.D. Diseases & Surgery of the Retina and Vitreous [facility]  I have reviewed the above documentation for accuracy and completeness, and I agree with the above. Gardiner Sleeper, M.D., Ph.D. 10/07/20 5:16 PM   Abbreviations: M myopia (nearsighted); A astigmatism; H hyperopia (farsighted); P presbyopia; Mrx spectacle prescription;  CTL contact lenses; OD right  eye; OS left eye; OU both eyes  XT exotropia; ET esotropia; PEK punctate epithelial keratitis; PEE punctate epithelial erosions; DES dry  eye syndrome; MGD meibomian gland dysfunction; ATs artificial tears; PFAT's preservative free artificial tears; [place] nuclear sclerotic cataract; PSC posterior subcapsular cataract; ERM epi-retinal membrane; PVD posterior vitreous detachment; RD retinal detachment; DM diabetes mellitus; DR diabetic retinopathy; NPDR non-proliferative diabetic retinopathy; PDR proliferative diabetic retinopathy; CSME clinically significant macular edema; DME diabetic macular edema; dbh dot blot hemorrhages; CWS cotton wool spot; POAG primary open angle glaucoma; C/D cup-to-disc ratio; HVF humphrey visual field; GVF goldmann visual field; OCT optical coherence tomography; IOP intraocular pressure; BRVO Branch retinal vein occlusion; CRVO central retinal vein occlusion; CRAO central retinal artery occlusion; BRAO branch retinal artery occlusion; RT retinal tear; SB scleral buckle; PPV pars plana vitrectomy; VH Vitreous hemorrhage; PRP panretinal laser photocoagulation; IVK intravitreal kenalog; VMT vitreomacular traction; MH Macular hole;  NVD neovascularization of the disc; NVE neovascularization elsewhere; AREDS age related eye disease study; ARMD age related macular degeneration; POAG primary open angle glaucoma; EBMD epithelial/anterior basement membrane dystrophy; ACIOL anterior chamber intraocular lens; IOL intraocular lens; PCIOL posterior chamber intraocular lens; Phaco/IOL phacoemulsification with intraocular lens placement; [place] photorefractive keratectomy; LASIK laser assisted in situ keratomileusis; HTN hypertension; DM diabetes mellitus; COPD chronic obstructive pulmonary disease

## 2020-10-23 ENCOUNTER — Encounter: Payer: Medicare Other | Admitting: Thoracic Surgery (Cardiothoracic Vascular Surgery)

## 2020-10-30 ENCOUNTER — Ambulatory Visit: Payer: Medicare Other | Admitting: Thoracic Surgery (Cardiothoracic Vascular Surgery)

## 2020-11-20 ENCOUNTER — Encounter (INDEPENDENT_AMBULATORY_CARE_PROVIDER_SITE_OTHER): Payer: Medicare Other | Admitting: Ophthalmology

## 2020-11-23 NOTE — Progress Notes (Signed)
[facility] Note  11/24/2020     CHIEF COMPLAINT Patient presents for Retina Follow Up   HISTORY OF PRESENT ILLNESS: Cody Phelps is a 80 y.o. male who presents to the clinic today for:  HPI    Retina Follow Up    Patient presents with  Other.  In right eye.  This started months ago.  Severity is moderate.  Duration of 8 weeks.  Since onset it is gradually worsening.  I, the attending physician,  performed the HPI with the patient and updated documentation appropriately.          Comments    80 y/o male pt here for 8 wk f/u for CME OD.  Feels [facility] OD is not quite as good.  No change in [place] OS.  Denies pain, FOL, floaters.  PF and Prolensa BID OD.  BS 170 last night.  A1C unknown.       Last edited by [name], MD on 11/26/2020 10:22 PM. (History)    pt states his vision seems to be better, he is using PF and Prolensa BID OD  Referring physician: [name], MD [place],  [place] [id number]  HISTORICAL INFORMATION:   Selected notes from the [facility] Referred by Dr. [name] for concern of RD OD   CURRENT MEDICATIONS: Current Outpatient Medications (Ophthalmic Drugs)  Medication Sig  . prednisoLONE acetate (PRED FORTE) 1 % ophthalmic suspension Place 1 drop into the right eye daily.   No current facility-administered medications for this visit. (Ophthalmic Drugs)   Current Outpatient Medications (Other)  Medication Sig  . amiodarone (PACERONE) 200 MG tablet Take 200 mg by mouth daily.  Marland Kitchen atorvastatin (LIPITOR) 80 MG tablet TAKE 1 TABLET BY MOUTH  DAILY  . colesevelam (WELCHOL) 625 MG tablet Take 625 mg by mouth 3 (three) times daily.   Marland Kitchen donepezil (ARICEPT) 10 MG tablet Take 10 mg by mouth every morning.   Marland Kitchen esomeprazole (NEXIUM) 40 MG capsule Take 40 mg by mouth daily.   . ferrous sulfate 325 (65 FE) MG tablet Take 1 tablet (325 mg total) by mouth daily with breakfast.  . gabapentin (NEURONTIN) 300 MG  capsule   . insulin glargine (LANTUS) 100 UNIT/ML injection Inject 20-40 Units into the skin at bedtime as needed (BGL below 140-150 take 20 units, if 151 or higher take 40 units).  . memantine (NAMENDA) 5 MG tablet Take 5 mg by mouth 2 (two) times daily.   . metoprolol tartrate (LOPRESSOR) 25 MG tablet TAKE 1 TABLET BY MOUTH  DAILY (Patient taking differently: Take 12.5 mg by mouth 2 (two) times daily.)  . mirabegron ER (MYRBETRIQ) 50 MG TB24 tablet Take 50 mg by mouth at bedtime.   Marland Kitchen omega-3 acid ethyl esters (LOVAZA) 1 G capsule Take 2 g by mouth daily.  Marland Kitchen rOPINIRole (REQUIP) 2 MG tablet Take 2 mg by mouth 3 (three) times daily as needed (RLS).   Marland Kitchen spironolactone (ALDACTONE) 25 MG tablet TAKE ONE-HALF TABLET BY  MOUTH DAILY  . tamsulosin (FLOMAX) 0.4 MG CAPS capsule Take 0.4 mg by mouth daily after supper.   . torsemide (DEMADEX) 20 MG tablet Take 1 tablet (20 mg total) by mouth 2 (two) times daily as needed (Leg swelling).  . valsartan-hydrochlorothiazide (DIOVAN-HCT) 80-12.5 MG tablet TAKE 1 TABLET BY MOUTH IN  THE MORNING  . vitamin B-12 (CYANOCOBALAMIN) 1000 MCG tablet Take 1,000 mcg by mouth 2 (two) times daily.  Marland Kitchen  XARELTO 15 MG TABS tablet TAKE 1 TABLET BY MOUTH IN  THE EVENING AFTER DINNER   No current facility-administered medications for this visit. (Other)      REVIEW OF SYSTEMS: ROS    Positive for: Musculoskeletal, Endocrine, Cardiovascular, Eyes, Respiratory   Negative for: Constitutional, Gastrointestinal, Neurological, Skin, Genitourinary, HENT, Psychiatric, Allergic/Imm, Heme/Lymph   Last edited by Matthew Folks, COA on 11/24/2020  2:18 PM. (History)       ALLERGIES No Known Allergies  PAST MEDICAL HISTORY Past Medical History:  Diagnosis Date  . Acute anterior wall MI ([place]) 03/01/2018  . Acute combined systolic and diastolic heart failure ([place]) 03/15/2018  . Burn   . Chronic diastolic CHF (congestive heart failure) ([place]) 09/21/2018  . Defect, retina, with  detachment   . Dementia ([place])   . Diverticulosis   . ED (erectile dysfunction)   . First degree heart block   . GERD (gastroesophageal reflux disease)   . Heart murmur   . History of blood transfusion    with open heart surgery  . Hypertension    cardiologsit-  dr [name]  . Hypertensive retinopathy    OU  . Iron deficiency anemia   . Mixed hyperlipidemia   . Morbid obesity ([place])   . Myocardial infarction ([place])    02/28/18  . OA (osteoarthritis)    right hip  . OSA on CPAP    followed by dr dohmeier, uses CPAP  . PAF (paroxysmal atrial fibrillation) ([name])    a. on Eliquis  . Prostate cancer ([place])    dx 03-17-2017 (bx)  Stage T2a, Gleason 4+4, PSA  4.43, vol 32.32--  s/p radiatctive prostate seed implants 07-10-2017 then IMRT and ADT  . Retinal detachment    Rheg. RD OS  . RLS (restless legs syndrome)   . S/P aortic valve replacement with bioprosthetic valve 05/30/2018   23 mm Edwards Inspiris Resilia stented bovine pericardial tissue valve  . 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 S/P [place] operation for atrial fibrillation 05/30/2018   Complete bilateral atrial lesion set using bipolar radiofrequency and cryothermy ablation with clipping of LA appendage  . Scoliosis   . Trigger finger   . Type 2 diabetes mellitus ([place])   . Wears partial dentures    upper and lower   Past Surgical History:  Procedure Laterality Date  . AORTIC VALVE REPLACEMENT N/A 05/30/2018   Procedure: AORTIC VALVE REPLACEMENT (AVR) using Inspiris Valve, Size 23;  Surgeon: [name], MD;  Location: [facility];  Service: Open Heart Surgery;  Laterality: N/A;  . APPENDECTOMY  1988  . BILATERAL TOTAL ETHMOIDECTOMY AND SPHENOIDECTOMY  01-14-2009    dr bates   [facility]  . CARDIOVERSION N/A 08/10/2018   Procedure: CARDIOVERSION;  Surgeon: Adrian Prows, MD;  Location: [facility];  Service: Cardiovascular;  Laterality: N/A;  . CARPAL TUNNEL RELEASE Bilateral 2013  . CATARACT EXTRACTION Bilateral     Dr. [name]  . CATARACT EXTRACTION W/ INTRAOCULAR LENS  IMPLANT, BILATERAL  date?  . CORONARY ARTERY BYPASS GRAFT N/A 05/30/2018  . CORONARY BALLOON ANGIOPLASTY N/A 03/01/2018   Procedure: CORONARY BALLOON ANGIOPLASTY;  Surgeon: Adrian Prows, MD;  Location: [facility];  Service: Cardiovascular;  Laterality: N/A;  . CORONARY/GRAFT ACUTE MI REVASCULARIZATION N/A 03/01/2018   Procedure: Coronary/Graft Acute MI Revascularization;  Surgeon: Adrian Prows, MD;  Location: [facility];  Service: Cardiovascular;  Laterality: N/A;  . CYSTOSCOPY  07/19/2017   Procedure:  CYSTOSCOPY;  Surgeon: Nickie Retort, MD;  Location[facility];  Service: Urology;;  No seeds found in blader  . EYE SURGERY Bilateral    Cat Sx OU.  Dislocated IOL sx OD.  Rheg. RD repair OS.  . EYE SURGERY    . GAS/FLUID EXCHANGE Right 06/13/2019   Procedure: GAS/FLUID EXCHANGE;  Surgeon: [name], MD;  Location: [facility];  Service: Ophthalmology;  Laterality: Right;  . LAMINECTOMY WITH POSTERIOR LATERAL ARTHRODESIS LEVEL 2 N/A 02/12/2014   Procedure: LUMBAR TWO-THREE,LUIMBAR THREE-FOUR LAMINECTOMY/FORAMINOTOMY;POSSIBLE POSTEROLATERAL ARTHRODESIS WITH AUTOGRAFT;  Surgeon: [name], MD;  Location: [facility];  Service: Neurosurgery;  Laterality: N/A;  . LEFT HEART CATH AND CORONARY ANGIOGRAPHY N/A 03/01/2018   Procedure: LEFT HEART CATH AND CORONARY ANGIOGRAPHY;  Surgeon: Adrian Prows, MD;  Location: [facility];  Service: Cardiovascular;  Laterality: N/A;  . MAZE N/A 05/30/2018   Procedure: MAZE;  Surgeon: [name], MD;  Location: [facility];  Service: Open Heart Surgery;  Laterality: N/A;  . ORIF RIGHT ANKLE FX'S  05/05/2001   retained hardware  . PARS PLANA VITRECTOMY Right 06/13/2019   Procedure: PARS PLANA VITRECTOMY WITH 25 GAUGE WITH LASER AND GAS;  Surgeon: [name], MD;  Location: [facility];  Service: Ophthalmology;  Laterality: Right;  . PARS PLANA VITRECTOMY Right 09/12/2019    Procedure: [place] Vitrectomy With 25 Gauge;  Surgeon: [name], MD;  Location: [facility];  Service: Ophthalmology;  Laterality: Right;  . [place] INJECTION Right 06/13/2019   Procedure: PERFLUORONE INJECTION;  Surgeon: [name], MD;  Location: [facility];  Service: Ophthalmology;  Laterality: Right;  . PHOTOCOAGULATION WITH LASER Right 06/13/2019   Procedure: PHOTOCOAGULATION WITH LASER;  Surgeon: [name], MD;  Location: [facility];  Service: Ophthalmology;  Laterality: Right;  . PLACEMENT AND SUTURE OF SECONDARY INTRAOCULAR LENS Right 09/12/2019   Procedure: PLACEMENT AND SUTURE OF SECONDARY INTRAOCULAR LENS;  Surgeon: [name], MD;  Location: [facility];  Service: Ophthalmology;  Laterality: Right;  . PROSTATE BIOPSY  03-17-2017   dr Pilar Jarvis office  . RADIOACTIVE SEED IMPLANT N/A 07/19/2017   Procedure: RADIOACTIVE SEED IMPLANT/BRACHYTHERAPY IMPLANT;  Surgeon: Nickie Retort, MD;  Location: [facility];  Service: Urology;  Laterality: N/A;  69 seeds implanted  . REMOVAL RETAINED LENS Right 09/12/2019   Procedure: REMOVAL RETAINED LENS;  Surgeon: [name], MD;  Location: [facility];  Service: Ophthalmology;  Laterality: Right;  . RETINAL DETACHMENT SURGERY Right 06/13/2019   Dr. Coralyn Pear  . SKIN GRAFT     face  . SPACE OAR INSTILLATION N/A 07/19/2017   Procedure: SPACE OAR INSTILLATION;  Surgeon: Nickie Retort, MD;  Location[facility];  Service: Urology;  Laterality: N/A;  . TEE WITHOUT CARDIOVERSION  03/20/2012   Procedure: TRANSESOPHAGEAL ECHOCARDIOGRAM (TEE);  Surgeon: Laverda Page, MD;  Location: [facility];  Service: Cardiovascular;  Laterality: N/A;  normal LV; normal EF; normal RV; normal LA w/ left atrial appendage very small, normal function, interatrial septum intact without defect; normal RA; trace MR,TR, & PI; mild AV calcification and senile degeneration w/ mild stenosis, AVA 1.7cm^2;;   . TEE WITHOUT CARDIOVERSION N/A  05/30/2018   Procedure: TRANSESOPHAGEAL ECHOCARDIOGRAM (TEE);  Surgeon: [name], MD;  Location: [facility];  Service: Open Heart Surgery;  Laterality: N/A;  . TOTAL HIP ARTHROPLASTY Right 10/23/2017   Procedure: TOTAL HIP ARTHROPLASTY ANTERIOR APPROACH;  Surgeon: Frederik Pear, MD;  Location: [facility];  Service: Orthopedics;  Laterality: Right;  . TRANSTHORACIC ECHOCARDIOGRAM  01-11-2017   dr [name]  (per echo note, no significant change in seveity of AS, no other diagnostic change)   moderate concentric LVH, ef 92%, grade 1 diastolic dysfunction/  moderate LAE/  mild , grade 1 AR w/ moderate AV calcification, mild to moderate restricted AV leaflets w/ moderate AS, AVA 1.16cm^2, peak grandient 78mHg, mean grandient 31mg/  trace MR, mild calcification MV annulus , mild MV leaflet calcification, mild MVS, peak grandient 4.1m34m, mean grandient 2.7mm68m trace TR    FAMILY HISTORY Family History  Problem Relation Age of Onset  . Stroke Mother   . Hypertension Mother   . Heart attack Father   . Heart murmur Father   . Hypertension Sister   . Lung disease Brother   . Colon cancer Neg Hx   . Stomach cancer Neg Hx   . Rectal cancer Neg Hx   . Pancreatic cancer Neg Hx     SOCIAL HISTORY Social History   Tobacco Use  . Smoking status: Former Smoker    Packs/day: 0.25    Years: 1.00    Pack years: 0.25    Types: Cigarettes    Quit date: 02/04/1967    Years since quitting: 53.8  . Smokeless tobacco: Never Used  . Tobacco comment: smoked 1 q2-3 days  Vaping Use  . Vaping Use: Never used  Substance Use Topics  . Alcohol use: Yes    Comment: occasional beer  . Drug use: No         OPHTHALMIC EXAM:  Base Eye Exam    Visual Acuity (Snellen - Linear)      Right Left   Dist [place] 20/40 - 20/20   Dist ph [place] 20/25 -2        Tonometry (Tonopen, 2:21 PM)      Right Left   Pressure 16 17       Pupils      Dark Light Shape React APD   Right 4 3 Round Minimal None   Left 4 3 Round  Minimal None       Visual Fields (Counting fingers)      Left Right    Full Full       Extraocular Movement      Right Left    Full, Ortho Full, Ortho       Neuro/Psych    Oriented x3: Yes   Mood/Affect: Normal       Dilation    Both eyes: 1.0% Mydriacyl, 2.5% Phenylephrine @ 2:21 PM        Slit Lamp and Fundus Exam    Slit Lamp Exam      Right Left   Lids/Lashes mild Meibomian gland dysfunction, mild Telangiectasia Dermatochalasis - upper lid, Meibomian gland dysfunction, Edema, Erythema lower lid, positive concretion left palp conj   Conjunctiva/Sclera White and quiet; STK gone in ST quadrant Temporal Pinguecula   Cornea well healed superior corneal wound, 2+inferior Punctate epithelial erosions, Arcus, mild tear film debris, fine endo pigment 1+ Punctate epithelial erosions, +verticellata, trace endo pigment, Debris in tear film   Anterior Chamber Deep, trace pigment Deep and quiet   Iris Round and dilated to 6mm,50mattered Transillumination defects 360, +iridodenesis Round and dilated   Lens Sutured Akreos IOL well centered  3 piece Posterior chamber intraocular in good position, trace Posterior capsular opacification   Vitreous post vitrectomy, trace pigment Vitreous syneresis, Posterior vitreous detachment       Fundus Exam  Right Left   Disc 1+Pallor, Sharp rim 2+Pallor, Sharp rim, temporal PPP, mild tilt   C/D Ratio 0.3 0.5   Macula Flat, Blunted foveal reflex, mild RPE mottling and clumping, interval increase in central cystic changes, scattered MA Flat, Blunted foveal reflex, Retinal pigment epithelial mottling and clumping, No heme or edema   Vessels Mild Vascular attenuation, mild Tortuousity, mild A/V crossing changes Vascular attenuation, mild Tortuous   Periphery attached, good 360 laser in place; ORIGINALLY: bullous superior retinal detachment from 1000-0130, another more shallow detachment lobe from 130-400, lattice and micro tears ST quadrant; Old  retinal tear at 0900 with surrounding laser Attached, peripheral laser scars at 1030, No new RT/RD          IMAGING AND PROCEDURES  Imaging and Procedures for _0 @  OCT, Retina - OU - Both Eyes       Right Eye Quality was good. Central Foveal Thickness: 382. Progression has worsened. Findings include normal foveal contour, no SRF, intraretinal fluid (Interval increase in IRF/CME Nasal fovea; trace ERM).   Left Eye Quality was good. Central Foveal Thickness: 270. Progression has been stable. Findings include normal foveal contour, no IRF, no SRF.   Notes *Images captured and stored on drive  Diagnosis / Impression:  OD: Interval increase in IRF/cystic changes Nasal fovea; trace ERM OS: NFP, no IRF/SRF   Clinical management:  See below  Abbreviations: NFP - Normal foveal profile. CME - cystoid macular edema. PED - pigment epithelial detachment. IRF - intraretinal fluid. SRF - subretinal fluid. EZ - ellipsoid zone. ERM - epiretinal membrane. ORA - outer retinal atrophy. ORT - outer retinal tubulation. SRHM - subretinal hyper-reflective material                 ASSESSMENT/PLAN:    ICD-10-CM   1. Dislocation of intraocular lens, sequela  T85.22XS   2. Aphakia of right eye  H27.01   3. Right retinal detachment  H33.21   4. 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 Retinal edema  H35.81 OCT, Retina - OU - Both Eyes  6. History of repair of retinal tear by laser photocoagulation  Z98.890   7. Diabetes mellitus type 2 without retinopathy ([place])  E11.9   8. Essential hypertension  I10   9. Hypertensive retinopathy of both eyes  H35.033   10. Pseudophakia of both eyes  Z96.1   11. Cystoid macular edema of right eye  H35.351   12. Meibomian gland dysfunction (MGD) of both eyes  H02.883    H02.886   13. Chalazion left upper eyelid  H00.14    1,2. Dislocated IOL OD  - pt with history of partially dislocated 3-piece IOL -- spontaneously dislocated completely  into vitreous cavity on 1.23.21  - s/p 25g PPV w/ IOL explantation and secondary sutured IOL OD -- Akreos AO60 lens, 17.5D -- 02.11.2021             - IOL in good position  - 2 superior nylon sutures removed at slit lamp 03.12.21 -- last nylon suture removed at slit lamp, 04.30.21  - corneal edema resolved; PEE improved             - s/p STK OD #1 (07.09.21), #2 (01.26.22) for CME  - BCVA 20/25 from 20/30  - OCT shows interval increase in IRF/CME (see #11 below)  - IOP okay at 16  - ATs QID OU              -  f/u 4-6 wks -- DFE/OCT   3-5. Rhegmatogenous retinal detachment, right eye  - bullous, superior, mac off detachment, onset of foveal involvement 11.11.20 by history  - Bi-lobed superior detachment, superior lobe spanning 1000-0130, nasal lobe 0130-0400  - lattice degeneration with microtears noted at 1030  - s/p PPV/PFC/EL/FAX/14% C3F8 OD, 11.12.20             - intraop: HST at 2 oclock was found; also detachment had progressed to subtotal detachment spanning 9 oclock to 6 oclock (going in clockwise direction); also severe zonular insufficiency with IOL very mobile throughout case -- did not dislocate completely during the case or immediately post op  - doing well              - retina attached and in good position--good laser in place  6. History of retinal defects s/p laser retinopexy OU with Dr. Zigmund Daniel in 2013  - OD laser at 0900  - OS laser at 1030  - stable  7. Diabetes mellitus, type 2 without retinopathy  - The incidence, risk factors for progression, natural history and treatment options for diabetic retinopathy  were discussed with patient.    - The need for close monitoring of blood glucose, blood pressure, and serum lipids, avoiding cigarette or any type of tobacco, and the need for long term follow up was also discussed with patient  8,9. Hypertensive retinopathy OU  - discussed importance of tight BP control  - monitor  10. Pseudophakia OU  - s/p CE/IOL OU (Dr.  [name])  - 3 piece IOL OD was completely displaced into vitreous -- now with sutured Akreos IOL in good position OD -- see above  - OS 3-piece IOL in good position  - monitor  11. CME OD  - post op edema  - mild worsening on PF and prolensa QID OD  - s/p STK OD #1 07.09.21, #2 (01.26.22)  - interval increase in IRF/CME on OCT today 04.25.22 -- using PF and prolensa BID OD   - recommend increasing both PF and Prolensa to QID OD  - f/u 4-6 weeks, DFE, OCT, possible STK injection  Ophthalmic Meds Ordered this visit:  No orders of the defined types were placed in this encounter.      Return in about 4 weeks (around 12/22/2020) for f/u CME OD, DFE, OCT.  There are no Patient Instructions on file for this visit.  This document serves as a record of services personally performed by Gardiner Sleeper, MD, PhD. It was created on their behalf by Leeann Must, [place], an ophthalmic technician. The creation of this record is the provider's dictation and/or activities during the visit.    Electronically signed by: Leeann Must, COA _0 @ 10:32 PM   This document serves as a record of services personally performed by Gardiner Sleeper, MD, PhD. It was created on their behalf by San Jetty. Owens Shark, OA an ophthalmic technician. The creation of this record is the provider's dictation and/or activities during the visit.    Electronically signed by: San Jetty. Marguerita Merles 04.26.2022 10:32 PM  Gardiner Sleeper, M.D., Ph.D. Diseases & Surgery of the Retina and Disautel 11/24/2020   I have reviewed the above documentation for accuracy and completeness, and I agree with the above. Gardiner Sleeper, M.D., Ph.D. 11/26/20 10:32 PM   Abbreviations: M myopia (nearsighted); A astigmatism; H hyperopia (farsighted); P presbyopia; Mrx spectacle prescription;  CTL contact lenses; OD right eye; OS  left eye; OU both eyes  XT exotropia; ET esotropia; PEK punctate epithelial keratitis;  PEE punctate epithelial erosions; DES dry eye syndrome; MGD meibomian gland dysfunction; ATs artificial tears; PFAT's preservative free artificial tears; [place] nuclear sclerotic cataract; PSC posterior subcapsular cataract; ERM epi-retinal membrane; PVD posterior vitreous detachment; RD retinal detachment; DM diabetes mellitus; DR diabetic retinopathy; NPDR non-proliferative diabetic retinopathy; PDR proliferative diabetic retinopathy; CSME clinically significant macular edema; DME diabetic macular edema; dbh dot blot hemorrhages; CWS cotton wool spot; POAG primary open angle glaucoma; C/D cup-to-disc ratio; HVF humphrey visual field; GVF goldmann visual field; OCT optical coherence tomography; IOP intraocular pressure; BRVO Branch retinal vein occlusion; CRVO central retinal vein occlusion; CRAO central retinal artery occlusion; BRAO branch retinal artery occlusion; RT retinal tear; SB scleral buckle; PPV pars plana vitrectomy; VH Vitreous hemorrhage; PRP panretinal laser photocoagulation; IVK intravitreal kenalog; VMT vitreomacular traction; MH Macular hole;  NVD neovascularization of the disc; NVE neovascularization elsewhere; AREDS age related eye disease study; ARMD age related macular degeneration; POAG primary open angle glaucoma; EBMD epithelial/anterior basement membrane dystrophy; ACIOL anterior chamber intraocular lens; IOL intraocular lens; PCIOL posterior chamber intraocular lens; Phaco/IOL phacoemulsification with intraocular lens placement; [place] photorefractive keratectomy; LASIK laser assisted in situ keratomileusis; HTN hypertension; DM diabetes mellitus; COPD chronic obstructive pulmonary disease

## 2020-11-24 ENCOUNTER — Other Ambulatory Visit: Payer: Self-pay

## 2020-11-24 ENCOUNTER — Encounter (INDEPENDENT_AMBULATORY_CARE_PROVIDER_SITE_OTHER): Payer: Self-pay | Admitting: Ophthalmology

## 2020-11-24 ENCOUNTER — Ambulatory Visit (INDEPENDENT_AMBULATORY_CARE_PROVIDER_SITE_OTHER): Payer: Medicare Other | Admitting: Ophthalmology

## 2020-11-24 DIAGNOSIS — H35033 Hypertensive retinopathy, bilateral: Secondary | ICD-10-CM

## 2020-11-24 DIAGNOSIS — Z9889 Other specified postprocedural states: Secondary | ICD-10-CM

## 2020-11-24 DIAGNOSIS — T8522XS Displacement of intraocular lens, sequela: Secondary | ICD-10-CM

## 2020-11-24 DIAGNOSIS — E119 Type 2 diabetes mellitus without complications: Secondary | ICD-10-CM | POA: Diagnosis not present

## 2020-11-24 DIAGNOSIS — H3581 Retinal edema: Secondary | ICD-10-CM

## 2020-11-24 DIAGNOSIS — H0014 Chalazion left upper eyelid: Secondary | ICD-10-CM

## 2020-11-24 DIAGNOSIS — H2701 Aphakia, right eye: Secondary | ICD-10-CM | POA: Diagnosis not present

## 2020-11-24 DIAGNOSIS — I10 Essential (primary) hypertension: Secondary | ICD-10-CM

## 2020-11-24 DIAGNOSIS — H35411 Lattice degeneration of retina, right eye: Secondary | ICD-10-CM

## 2020-11-24 DIAGNOSIS — H3321 Serous retinal detachment, right eye: Secondary | ICD-10-CM | POA: Diagnosis not present

## 2020-11-24 DIAGNOSIS — Z961 Presence of intraocular lens: Secondary | ICD-10-CM

## 2020-11-24 DIAGNOSIS — H02883 Meibomian gland dysfunction of right eye, unspecified eyelid: Secondary | ICD-10-CM

## 2020-11-24 DIAGNOSIS — H35351 Cystoid macular degeneration, right eye: Secondary | ICD-10-CM

## 2020-11-26 ENCOUNTER — Encounter (INDEPENDENT_AMBULATORY_CARE_PROVIDER_SITE_OTHER): Payer: Self-pay | Admitting: Ophthalmology

## 2020-12-20 ENCOUNTER — Other Ambulatory Visit: Payer: Self-pay | Admitting: Oncology

## 2020-12-30 NOTE — Progress Notes (Signed)
[facility] Note  01/01/2021     CHIEF COMPLAINT Patient presents for Retina Follow Up   HISTORY OF PRESENT ILLNESS: Cody Phelps is a 80 y.o. male who presents to the clinic today for:  HPI    Retina Follow Up    Patient presents with  Other.  In right eye.  This started 5 weeks ago.  I, the attending physician,  performed the HPI with the patient and updated documentation appropriately.          Comments    Patient here for 5 weeks retina follow up for CME OD. Patient states vision pretty good. Has a lot of moisture-tears in eyes keeps blurry. No eye pain.       Last edited by [name], MD on 01/01/2021  4:34 PM. (History)    pt states his eyes are tearing, which is making his vision worse  Referring physician: [name], MD [place],   [id number]  HISTORICAL INFORMATION:   Selected notes from the [place] Referred by Dr. [name] for concern of RD OD   CURRENT MEDICATIONS: Current Outpatient Medications (Ophthalmic Drugs)  Medication Sig  . Bromfenac Sodium (PROLENSA) 0.07 % SOLN Place 1 drop into the right eye 4 (four) times daily.  . prednisoLONE acetate (PRED FORTE) 1 % ophthalmic suspension Place 1 drop into the right eye 4 (four) times daily.   No current facility-administered medications for this visit. (Ophthalmic Drugs)   Current Outpatient Medications (Other)  Medication Sig  . amiodarone (PACERONE) 200 MG tablet Take 200 mg by mouth daily.  Marland Kitchen atorvastatin (LIPITOR) 80 MG tablet TAKE 1 TABLET BY MOUTH  DAILY  . colesevelam (WELCHOL) 625 MG tablet Take 625 mg by mouth 3 (three) times daily.   Marland Kitchen donepezil (ARICEPT) 10 MG tablet Take 10 mg by mouth every morning.   Marland Kitchen esomeprazole (NEXIUM) 40 MG capsule Take 40 mg by mouth daily.   . ferrous sulfate 325 (65 FE) MG tablet TAKE 1 TABLET BY MOUTH EVERY DAY WITH BREAKFAST  . gabapentin (NEURONTIN) 300 MG capsule   . insulin glargine  (LANTUS) 100 UNIT/ML injection Inject 20-40 Units into the skin at bedtime as needed (BGL below 140-150 take 20 units, if 151 or higher take 40 units).  . memantine (NAMENDA) 5 MG tablet Take 5 mg by mouth 2 (two) times daily.   . metoprolol tartrate (LOPRESSOR) 25 MG tablet TAKE 1 TABLET BY MOUTH  DAILY (Patient taking differently: Take 12.5 mg by mouth 2 (two) times daily.)  . mirabegron ER (MYRBETRIQ) 50 MG TB24 tablet Take 50 mg by mouth at bedtime.   Marland Kitchen omega-3 acid ethyl esters (LOVAZA) 1 G capsule Take 2 g by mouth daily.  Marland Kitchen rOPINIRole (REQUIP) 2 MG tablet Take 2 mg by mouth 3 (three) times daily as needed (RLS).   Marland Kitchen spironolactone (ALDACTONE) 25 MG tablet TAKE ONE-HALF TABLET BY  MOUTH DAILY  . tamsulosin (FLOMAX) 0.4 MG CAPS capsule Take 0.4 mg by mouth daily after supper.   . torsemide (DEMADEX) 20 MG tablet Take 1 tablet (20 mg total) by mouth 2 (two) times daily as needed (Leg swelling).  . valsartan-hydrochlorothiazide (DIOVAN-HCT) 80-12.5 MG tablet TAKE 1 TABLET BY MOUTH IN  THE MORNING  . vitamin B-12 (CYANOCOBALAMIN) 1000 MCG tablet Take 1,000 mcg by mouth 2 (two) times daily.  Alveda Reasons 15 MG TABS tablet TAKE 1 TABLET BY MOUTH IN  THE EVENING AFTER DINNER  No current facility-administered medications for this visit. (Other)      REVIEW OF SYSTEMS: ROS    Positive for: Musculoskeletal, Endocrine, Cardiovascular, Eyes, Respiratory   Negative for: Constitutional, Gastrointestinal, Neurological, Skin, Genitourinary, HENT, Psychiatric, Allergic/Imm, Heme/Lymph   Last edited by [name], COA on 01/01/2021  1:33 PM. (History)       ALLERGIES No Known Allergies  PAST MEDICAL HISTORY Past Medical History:  Diagnosis Date  . Acute anterior wall MI ([place]) 03/01/2018  . Acute combined systolic and diastolic heart failure ([place]) 03/15/2018  . Burn   . Chronic diastolic CHF (congestive heart failure) ([place]) 09/21/2018  . Defect, retina, with detachment   . Dementia ([place])   .  Diverticulosis   . ED (erectile dysfunction)   . First degree heart block   . GERD (gastroesophageal reflux disease)   . Heart murmur   . History of blood transfusion    with open heart surgery  . Hypertension    cardiologsit-  dr [name]  . Hypertensive retinopathy    OU  . Iron deficiency anemia   . Mixed hyperlipidemia   . Morbid obesity ([place])   . Myocardial infarction ([place])    02/28/18  . OA (osteoarthritis)    right hip  . OSA on CPAP    followed by dr dohmeier, uses CPAP  . PAF (paroxysmal atrial fibrillation) ([name])    a. on Eliquis  . Prostate cancer ([place])    dx 03-17-2017 (bx)  Stage T2a, Gleason 4+4, PSA  4.43, vol 32.32--  s/p radiatctive prostate seed implants 07-10-2017 then IMRT and ADT  . Retinal detachment    Rheg. RD OS  . RLS (restless legs syndrome)   . S/P aortic valve replacement with bioprosthetic valve 05/30/2018   23 mm Edwards Inspiris Resilia stented bovine pericardial tissue valve  . 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 S/P [place] operation for atrial fibrillation 05/30/2018   Complete bilateral atrial lesion set using bipolar radiofrequency and cryothermy ablation with clipping of LA appendage  . Scoliosis   . Trigger finger   . Type 2 diabetes mellitus ([place])   . Wears partial dentures    upper and lower   Past Surgical History:  Procedure Laterality Date  . AORTIC VALVE REPLACEMENT N/A 05/30/2018   Procedure: AORTIC VALVE REPLACEMENT (AVR) using Inspiris Valve, Size 23;  Surgeon: [name], MD;  Location: [facility];  Service: Open Heart Surgery;  Laterality: N/A;  . APPENDECTOMY  1988  . BILATERAL TOTAL ETHMOIDECTOMY AND SPHENOIDECTOMY  01-14-2009    dr bates   [facility]  . CARDIOVERSION N/A 08/10/2018   Procedure: CARDIOVERSION;  Surgeon: Adrian Prows, MD;  Location: [facility];  Service: Cardiovascular;  Laterality: N/A;  . CARPAL TUNNEL RELEASE Bilateral 2013  . CATARACT EXTRACTION Bilateral    Dr. [name]  . CATARACT EXTRACTION  W/ INTRAOCULAR LENS  IMPLANT, BILATERAL  date?  . CORONARY ARTERY BYPASS GRAFT N/A 05/30/2018  . CORONARY BALLOON ANGIOPLASTY N/A 03/01/2018   Procedure: CORONARY BALLOON ANGIOPLASTY;  Surgeon: Adrian Prows, MD;  Location: [facility];  Service: Cardiovascular;  Laterality: N/A;  . CORONARY/GRAFT ACUTE MI REVASCULARIZATION N/A 03/01/2018   Procedure: Coronary/Graft Acute MI Revascularization;  Surgeon: Adrian Prows, MD;  Location: [facility];  Service: Cardiovascular;  Laterality: N/A;  . CYSTOSCOPY  07/19/2017   Procedure: CYSTOSCOPY;  Surgeon: Nickie Retort, MD;  Location: [facility];  Service: Urology;;  No  seeds found in [place]  . EYE SURGERY Bilateral    Cat Sx OU.  Dislocated IOL sx OD.  Rheg. RD repair OS.  . EYE SURGERY    . GAS/FLUID EXCHANGE Right 06/13/2019   Procedure: GAS/FLUID EXCHANGE;  Surgeon: [name], MD;  Location: [facility];  Service: Ophthalmology;  Laterality: Right;  . LAMINECTOMY WITH POSTERIOR LATERAL ARTHRODESIS LEVEL 2 N/A 02/12/2014   Procedure: LUMBAR TWO-THREE,LUIMBAR THREE-FOUR LAMINECTOMY/FORAMINOTOMY;POSSIBLE POSTEROLATERAL ARTHRODESIS WITH AUTOGRAFT;  Surgeon: [name], MD;  Location: [facility];  Service: Neurosurgery;  Laterality: N/A;  . LEFT HEART CATH AND CORONARY ANGIOGRAPHY N/A 03/01/2018   Procedure: LEFT HEART CATH AND CORONARY ANGIOGRAPHY;  Surgeon: Adrian Prows, MD;  Location: [place] [facility] [facility];  Service: Cardiovascular;  Laterality: N/A;  . MAZE N/A 05/30/2018   Procedure: MAZE;  Surgeon: [name], MD;  Location: [facility];  Service: Open Heart Surgery;  Laterality: N/A;  . ORIF RIGHT ANKLE FX'S  05/05/2001   retained hardware  . PARS PLANA VITRECTOMY Right 06/13/2019   Procedure: PARS PLANA VITRECTOMY WITH 25 GAUGE WITH LASER AND GAS;  Surgeon: [name], MD;  Location: [facility];  Service: Ophthalmology;  Laterality: Right;  . PARS PLANA VITRECTOMY Right 09/12/2019   Procedure: [place] Vitrectomy With  25 Gauge;  Surgeon: [name], MD;  Location: [facility];  Service: Ophthalmology;  Laterality: Right;  . [place] INJECTION Right 06/13/2019   Procedure: PERFLUORONE INJECTION;  Surgeon: [name], MD;  Location: [facility];  Service: Ophthalmology;  Laterality: Right;  . PHOTOCOAGULATION WITH LASER Right 06/13/2019   Procedure: PHOTOCOAGULATION WITH LASER;  Surgeon: [name], MD;  Location: [place];  Service: Ophthalmology;  Laterality: Right;  . PLACEMENT AND SUTURE OF SECONDARY INTRAOCULAR LENS Right 09/12/2019   Procedure: PLACEMENT AND SUTURE OF SECONDARY INTRAOCULAR LENS;  Surgeon: [name], MD;  Location: [facility];  Service: Ophthalmology;  Laterality: Right;  . PROSTATE BIOPSY  03-17-2017   dr Pilar Jarvis office  . RADIOACTIVE SEED IMPLANT N/A 07/19/2017   Procedure: RADIOACTIVE SEED IMPLANT/BRACHYTHERAPY IMPLANT;  Surgeon: Nickie Retort, MD;  [facility];  Service: Urology;  Laterality: N/A;  69 seeds implanted  . REMOVAL RETAINED LENS Right 09/12/2019   Procedure: REMOVAL RETAINED LENS;  Surgeon: [name], MD;  Location: [facility];  Service: Ophthalmology;  Laterality: Right;  . RETINAL DETACHMENT SURGERY Right 06/13/2019   Dr. Coralyn Pear  . SKIN GRAFT     face  . SPACE OAR INSTILLATION N/A 07/19/2017   Procedure: SPACE OAR INSTILLATION;  Surgeon: Nickie Retort, MD;  Location: [facility];  Service: Urology;  Laterality: N/A;  . TEE WITHOUT CARDIOVERSION  03/20/2012   Procedure: TRANSESOPHAGEAL ECHOCARDIOGRAM (TEE);  Surgeon: Laverda Page, MD;  Location: [facility];  Service: Cardiovascular;  Laterality: N/A;  normal LV; normal EF; normal RV; normal LA w/ left atrial appendage very small, normal function, interatrial septum intact without defect; normal RA; trace MR,TR, & PI; mild AV calcification and senile degeneration w/ mild stenosis, AVA 1.7cm^2;;   . TEE WITHOUT CARDIOVERSION N/A 05/30/2018   Procedure: TRANSESOPHAGEAL  ECHOCARDIOGRAM (TEE);  Surgeon: [name], MD;  Location: [facility];  Service: Open Heart Surgery;  Laterality: N/A;  . TOTAL HIP ARTHROPLASTY Right 10/23/2017   Procedure: TOTAL HIP ARTHROPLASTY ANTERIOR APPROACH;  Surgeon: Frederik Pear, MD;  Location: [facility];  Service: Orthopedics;  Laterality: Right;  . TRANSTHORACIC ECHOCARDIOGRAM  01-11-2017   dr [name]  (per echo note, no  significant change in seveity of AS, no other diagnostic change)   moderate concentric LVH, ef 31%, grade 1 diastolic dysfunction/  moderate LAE/  mild , grade 1 AR w/ moderate AV calcification, mild to moderate restricted AV leaflets w/ moderate AS, AVA 1.16cm^2, peak grandient 74mHg, mean grandient 370mg/  trace MR, mild calcification MV annulus , mild MV leaflet calcification, mild MVS, peak grandient 4.44m2m, mean grandient 2.7mm34m trace TR    FAMILY HISTORY Family History  Problem Relation Age of Onset  . Stroke Mother   . Hypertension Mother   . Heart attack Father   . Heart murmur Father   . Hypertension Sister   . Lung disease Brother   . Colon cancer Neg Hx   . Stomach cancer Neg Hx   . Rectal cancer Neg Hx   . Pancreatic cancer Neg Hx     SOCIAL HISTORY Social History   Tobacco Use  . Smoking status: Former Smoker    Packs/day: 0.25    Years: 1.00    Pack years: 0.25    Types: Cigarettes    Quit date: 02/04/1967    Years since quitting: 53.9  . Smokeless tobacco: Never Used  . Tobacco comment: smoked 1 q2-3 days  Vaping Use  . Vaping Use: Never used  Substance Use Topics  . Alcohol use: Yes    Comment: occasional beer  . Drug use: No         OPHTHALMIC EXAM:  Base Eye Exam    Visual Acuity (Snellen - Linear)      Right Left   Dist [place] 20/40 -1 20/20   Dist ph [place] 20/30        Tonometry (Tonopen, 1:31 PM)      Right Left   Pressure 19 15       Pupils      Dark Light Shape React APD   Right 4 3 Round Minimal None   Left 4 3 Round Minimal None       Visual Fields  (Counting fingers)      Left Right    Full Full       Extraocular Movement      Right Left    Full, Ortho Full, Ortho       Neuro/Psych    Oriented x3: Yes   Mood/Affect: Normal       Dilation    Both eyes: 1.0% Mydriacyl, 2.5% Phenylephrine @ 1:31 PM        Slit Lamp and Fundus Exam    Slit Lamp Exam      Right Left   Lids/Lashes mild Meibomian gland dysfunction, mild Telangiectasia Dermatochalasis - upper lid, Meibomian gland dysfunction, Edema, Erythema lower lid, positive concretion left palp conj   Conjunctiva/Sclera White and quiet; STK gone in ST quadrant Temporal Pinguecula   Cornea well healed superior corneal wound, trace Punctate epithelial erosions, Arcus, mild tear film debris, fine endo pigment 1+ Punctate epithelial erosions, +verticellata, trace endo pigment, Debris in tear film   Anterior Chamber Deep, 0.5+ pigment Deep and quiet   Iris Round and dilated to 6mm,8mattered Transillumination defects 360, +iridodenesis, +pigment deposition Round and dilated   Lens Sutured Akreos IOL well centered  3 piece Posterior chamber intraocular in good position, trace Posterior capsular opacification   Vitreous post vitrectomy, trace pigment Vitreous syneresis, Posterior vitreous detachment       Fundus Exam      Right Left   Disc 1+Pallor, Sharp rim 2+Pallor, Sharp rim,  temporal PPP, mild tilt   C/D Ratio 0.3 0.5   Macula Flat, Blunted foveal reflex, mild RPE mottling and clumping, interval improvement in central cystic changes, +pigment deposition on retinal surface, scattered MA Flat, Blunted foveal reflex, Retinal pigment epithelial mottling and clumping, No heme or edema   Vessels Mild Vascular attenuation, mild Tortuousity, mild A/V crossing changes Vascular attenuation, mild Tortuous   Periphery attached, good 360 laser in place; ORIGINALLY: bullous superior retinal detachment from 1000-0130, another more shallow detachment lobe from 130-400, lattice and micro tears  ST quadrant; Old retinal tear at 0900 with surrounding laser Attached, peripheral laser scars at 1030, No new RT/RD        Refraction    Wearing Rx      Sphere Cylinder Axis Add   Right -1.25 +1.50 025 +2.75   Left -1.00 +1.75 180 +2.75          IMAGING AND PROCEDURES  Imaging and Procedures for _0 @  OCT, Retina - OU - Both Eyes       Right Eye Quality was good. Central Foveal Thickness: 279. Progression has improved. Findings include normal foveal contour, no SRF, intraretinal fluid (Interval improvement in IRF/foveal contour, residual cyst nasal fovea; trace ERM).   Left Eye Quality was good. Central Foveal Thickness: 264. Progression has been stable. Findings include normal foveal contour, no IRF, no SRF.   Notes *Images captured and stored on drive  Diagnosis / Impression:  OD: Interval improvement in IRF/foveal contour, residual cyst nasal fovea; trace ERM OS: NFP, no IRF/SRF   Clinical management:  See below  Abbreviations: NFP - Normal foveal profile. CME - cystoid macular edema. PED - pigment epithelial detachment. IRF - intraretinal fluid. SRF - subretinal fluid. EZ - ellipsoid zone. ERM - epiretinal membrane. ORA - outer retinal atrophy. ORT - outer retinal tubulation. SRHM - subretinal hyper-reflective material                 ASSESSMENT/PLAN:    ICD-10-CM   1. Dislocation of intraocular lens, sequela  T85.22XS   2. Aphakia of right eye  H27.01   3. Right retinal detachment  H33.21   4. 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 Retinal edema  H35.81 OCT, Retina - OU - Both Eyes  6. History of repair of retinal tear by laser photocoagulation  Z98.890   7. Diabetes mellitus type 2 without retinopathy ([place])  E11.9   8. Essential hypertension  I10   9. Hypertensive retinopathy of both eyes  H35.033   10. Pseudophakia of both eyes  Z96.1   11. Cystoid macular edema of right eye  H35.351    1,2. Dislocated IOL OD  - pt with history of  partially dislocated 3-piece IOL -- spontaneously dislocated completely into vitreous cavity on 1.23.21  - s/p 25g PPV w/ IOL explantation and secondary sutured IOL OD -- Akreos AO60 lens, 17.5D -- 02.11.2021             - IOL in good position  - 2 superior nylon sutures removed at slit lamp 03.12.21 -- last nylon suture removed at slit lamp, 04.30.21  - corneal edema resolved; PEE improved             - s/p STK OD #1 (07.09.21), #2 (01.26.22) for CME  - BCVA 20/25 from 20/30  - OCT shows interval improvement in IRF/CME (see #11 below)  - IOP okay at 19  - ATs QID OU              -  f/u 4-6 wks -- DFE/OCT   3-5. Rhegmatogenous retinal detachment, right eye  - bullous, superior, mac off detachment, onset of foveal involvement 11.11.20 by history  - Bi-lobed superior detachment, superior lobe spanning 1000-0130, nasal lobe 0130-0400  - lattice degeneration with microtears noted at 1030  - s/p PPV/PFC/EL/FAX/14% C3F8 OD, 11.12.20             - intraop: HST at 2 oclock was found; also detachment had progressed to subtotal detachment spanning 9 oclock to 6 oclock (going in clockwise direction); also severe zonular insufficiency with IOL very mobile throughout case -- did not dislocate completely during the case or immediately post op  - doing well              - retina attached and in good position--good laser in place  6. History of retinal defects s/p laser retinopexy OU with Dr. Zigmund Daniel in 2013  - OD laser at 0900  - OS laser at 1030  - stable  7. Diabetes mellitus, type 2 without retinopathy  - The incidence, risk factors for progression, natural history and treatment options for diabetic retinopathy  were discussed with patient.    - The need for close monitoring of blood glucose, blood pressure, and serum lipids, avoiding cigarette or any type of tobacco, and the need for long term follow up was also discussed with patient  8,9. Hypertensive retinopathy OU  - discussed importance of  tight BP control  - monitor  10. Pseudophakia OU  - s/p CE/IOL OU (Dr. [name])  - 3 piece IOL OD was completely displaced into vitreous -- now with sutured Akreos IOL in good position OD -- see above  - OS 3-piece IOL in good position  - monitor  11. CME OD  - post op edema  - s/p STK OD #1 07.09.21, #2 (01.26.22)  - interval increase in IRF/CME on OCT 04.25.22   - today, 6.3.22, interval improvement in IRF/CME -- pt has only been using PF QID, no prolensa  - recommend cont PF QID OD, restart Prolensa QID OD  - f/u 4-6 weeks, DFE, OCT, possible STK injection  Ophthalmic Meds Ordered this visit:  Meds ordered this encounter  Medications  . prednisoLONE acetate (PRED FORTE) 1 % ophthalmic suspension    Sig: Place 1 drop into the right eye 4 (four) times daily.    Dispense:  15 mL    Refill:  0  . Bromfenac Sodium (PROLENSA) 0.07 % SOLN    Sig: Place 1 drop into the right eye 4 (four) times daily.    Dispense:  3 mL    Refill:  3       Return for f/u 4-6 weeks, CME OD, DFE, OCT.  There are no Patient Instructions on file for this visit.  This document serves as a record of services personally performed by Gardiner Sleeper, MD, PhD. It was created on their behalf by San Jetty. Owens Shark, OA an ophthalmic technician. The creation of this record is the provider's dictation and/or activities during the visit.    Electronically signed by: San Jetty. Owens Shark, [place] 06.01.2022 4:37 PM  Gardiner Sleeper, M.D., Ph.D. Diseases & Surgery of the Retina and Vitreous [facility]  I have reviewed the above documentation for accuracy and completeness, and I agree with the above. Gardiner Sleeper, M.D., Ph.D. 01/01/21 4:37 PM   Abbreviations: M myopia (nearsighted); A astigmatism; H hyperopia (farsighted); P presbyopia; Mrx spectacle prescription;  CTL contact lenses; OD right eye; OS left eye; OU both eyes  XT exotropia; ET esotropia; PEK punctate epithelial keratitis; PEE  punctate epithelial erosions; DES dry eye syndrome; MGD meibomian gland dysfunction; ATs artificial tears; PFAT's preservative free artificial tears; [place] nuclear sclerotic cataract; PSC posterior subcapsular cataract; ERM epi-retinal membrane; PVD posterior vitreous detachment; RD retinal detachment; DM diabetes mellitus; DR diabetic retinopathy; NPDR non-proliferative diabetic retinopathy; PDR proliferative diabetic retinopathy; CSME clinically significant macular edema; DME diabetic macular edema; dbh dot blot hemorrhages; CWS cotton wool spot; POAG primary open angle glaucoma; C/D cup-to-disc ratio; HVF humphrey visual field; GVF goldmann visual field; OCT optical coherence tomography; IOP intraocular pressure; BRVO Branch retinal vein occlusion; CRVO central retinal vein occlusion; CRAO central retinal artery occlusion; BRAO branch retinal artery occlusion; RT retinal tear; SB scleral buckle; PPV pars plana vitrectomy; VH Vitreous hemorrhage; PRP panretinal laser photocoagulation; IVK intravitreal kenalog; VMT vitreomacular traction; MH Macular hole;  NVD neovascularization of the disc; NVE neovascularization elsewhere; AREDS age related eye disease study; ARMD age related macular degeneration; POAG primary open angle glaucoma; EBMD epithelial/anterior basement membrane dystrophy; ACIOL anterior chamber intraocular lens; IOL intraocular lens; PCIOL posterior chamber intraocular lens; Phaco/IOL phacoemulsification with intraocular lens placement; [place] photorefractive keratectomy; LASIK laser assisted in situ keratomileusis; HTN hypertension; DM diabetes mellitus; COPD chronic obstructive pulmonary disease

## 2020-12-31 ENCOUNTER — Inpatient Hospital Stay: Payer: Medicare Other | Attending: Oncology

## 2020-12-31 ENCOUNTER — Other Ambulatory Visit: Payer: Self-pay

## 2020-12-31 ENCOUNTER — Inpatient Hospital Stay (HOSPITAL_BASED_OUTPATIENT_CLINIC_OR_DEPARTMENT_OTHER): Payer: Medicare Other | Admitting: Oncology

## 2020-12-31 VITALS — BP 139/63 | HR 63 | Temp 97.6°F | Resp 18 | Wt 214.6 lb

## 2020-12-31 DIAGNOSIS — C61 Malignant neoplasm of prostate: Secondary | ICD-10-CM | POA: Insufficient documentation

## 2020-12-31 DIAGNOSIS — Z923 Personal history of irradiation: Secondary | ICD-10-CM | POA: Insufficient documentation

## 2020-12-31 DIAGNOSIS — Z79899 Other long term (current) drug therapy: Secondary | ICD-10-CM | POA: Diagnosis not present

## 2020-12-31 DIAGNOSIS — D649 Anemia, unspecified: Secondary | ICD-10-CM | POA: Diagnosis not present

## 2020-12-31 DIAGNOSIS — Z7901 Long term (current) use of anticoagulants: Secondary | ICD-10-CM | POA: Insufficient documentation

## 2020-12-31 LAB — CBC WITH DIFFERENTIAL (CANCER CENTER ONLY)
Abs Immature Granulocytes: 0.02 10*3/uL (ref 0.00–0.07)
Basophils Absolute: 0.1 10*3/uL (ref 0.0–0.1)
Basophils Relative: 2 %
Eosinophils Absolute: 0.5 10*3/uL (ref 0.0–0.5)
Eosinophils Relative: 9 %
HCT: 29.2 % — ABNORMAL LOW (ref 39.0–52.0)
Hemoglobin: 9.5 g/dL — ABNORMAL LOW (ref 13.0–17.0)
Immature Granulocytes: 0 %
Lymphocytes Relative: 17 %
Lymphs Abs: 1 10*3/uL (ref 0.7–4.0)
MCH: 28.4 pg (ref 26.0–34.0)
MCHC: 32.5 g/dL (ref 30.0–36.0)
MCV: 87.2 fL (ref 80.0–100.0)
Monocytes Absolute: 0.7 10*3/uL (ref 0.1–1.0)
Monocytes Relative: 11 %
Neutro Abs: 3.9 10*3/uL (ref 1.7–7.7)
Neutrophils Relative %: 61 %
Platelet Count: 171 10*3/uL (ref 150–400)
RBC: 3.35 MIL/uL — ABNORMAL LOW (ref 4.22–5.81)
RDW: 14 % (ref 11.5–15.5)
WBC Count: 6.2 10*3/uL (ref 4.0–10.5)
nRBC: 0 % (ref 0.0–0.2)

## 2020-12-31 LAB — FERRITIN: Ferritin: 197 ng/mL (ref 24–336)

## 2020-12-31 LAB — IRON AND TIBC
Iron: 58 ug/dL (ref 42–163)
Saturation Ratios: 21 % (ref 20–55)
TIBC: 275 ug/dL (ref 202–409)
UIBC: 217 ug/dL (ref 117–376)

## 2020-12-31 NOTE — Progress Notes (Signed)
Hematology and Oncology Follow Up Visit  Cody Phelps [id number] 1940/10/19 80 y.o. 12/31/2020 9:59 AM [name], MDSouth, [name], MD   Principle Diagnosis: 80 year old man with normocytic, normochromic anemia diagnosed in 2019.  His etiology is related to chronic disease, medication, previous radiation or possible early myelodysplasia.  He had also element of iron deficiency.  Secondary diagnosis: Prostate cancer presented with localized disease Gleason score of 4+4 = 8 and a PSA 4.4 in 2018.  Prior Therapy:  Definitive radiation therapy utilizing IMRT between August 15, 2017 and September 18, 2017.  He also received brachytherapy boost upfront on January 4 of 2019.  He received 45 Gy in 25 fraction to supplement his post implant of 110 Gy.  He completed androgen deprivation therapy under the care of Dr. Junious Silk for close to 2 years.  He status post IV iron infusion in August 2019.  He received Feraheme in November 2020.  Current therapy: Oral iron therapy.  Interim History: [name] is here for a follow-up visit.  Since the last visit, he reports no major changes in his health.  He continues to be on Xarelto without any major complaints.  He denies any nausea, vomiting or abdominal pain.  He denies any excessive fatigue or tiredness.  He denies any chest pain or palpitation.  He denies any hematochezia, melena or hemoptysis.       Medications: Unchanged on review. Current Outpatient Medications  Medication Sig Dispense Refill  . amiodarone (PACERONE) 200 MG tablet Take 200 mg by mouth daily.    Marland Kitchen atorvastatin (LIPITOR) 80 MG tablet TAKE 1 TABLET BY MOUTH  DAILY 90 tablet 3  . colesevelam (WELCHOL) 625 MG tablet Take 625 mg by mouth 3 (three) times daily.     Marland Kitchen donepezil (ARICEPT) 10 MG tablet Take 10 mg by mouth every morning.     Marland Kitchen esomeprazole (NEXIUM) 40 MG capsule Take 40 mg by mouth daily.     . ferrous sulfate 325 (65 FE) MG tablet TAKE 1 TABLET BY MOUTH EVERY DAY WITH  BREAKFAST 90 tablet 3  . gabapentin (NEURONTIN) 300 MG capsule     . insulin glargine (LANTUS) 100 UNIT/ML injection Inject 20-40 Units into the skin at bedtime as needed (BGL below 140-150 take 20 units, if 151 or higher take 40 units).    . memantine (NAMENDA) 5 MG tablet Take 5 mg by mouth 2 (two) times daily.     . metoprolol tartrate (LOPRESSOR) 25 MG tablet TAKE 1 TABLET BY MOUTH  DAILY (Patient taking differently: Take 12.5 mg by mouth 2 (two) times daily.) 90 tablet 3  . mirabegron ER (MYRBETRIQ) 50 MG TB24 tablet Take 50 mg by mouth at bedtime.     Marland Kitchen omega-3 acid ethyl esters (LOVAZA) 1 G capsule Take 2 g by mouth daily.    . prednisoLONE acetate (PRED FORTE) 1 % ophthalmic suspension Place 1 drop into the right eye daily. 15 mL 0  . rOPINIRole (REQUIP) 2 MG tablet Take 2 mg by mouth 3 (three) times daily as needed (RLS).     Marland Kitchen spironolactone (ALDACTONE) 25 MG tablet TAKE ONE-HALF TABLET BY  MOUTH DAILY 45 tablet 3  . tamsulosin (FLOMAX) 0.4 MG CAPS capsule Take 0.4 mg by mouth daily after supper.     . torsemide (DEMADEX) 20 MG tablet Take 1 tablet (20 mg total) by mouth 2 (two) times daily as needed (Leg swelling). 90 tablet 3  . valsartan-hydrochlorothiazide (DIOVAN-HCT) 80-12.5 MG tablet TAKE 1 TABLET  BY MOUTH IN  THE MORNING 90 tablet 3  . vitamin B-12 (CYANOCOBALAMIN) 1000 MCG tablet Take 1,000 mcg by mouth 2 (two) times daily.    Alveda Reasons 15 MG TABS tablet TAKE 1 TABLET BY MOUTH IN  THE EVENING AFTER DINNER 90 tablet 3   No current facility-administered medications for this visit.     Allergies: No Known Allergies      Physical Exam:  Blood pressure 139/63, pulse 63, temperature 97.6 F (36.4 C), temperature source Oral, resp. rate 18, weight 214 lb 9.6 oz (97.3 kg), SpO2 98 %.    ECOG: 1   General appearance: Alert, awake without any distress. Head: Atraumatic without abnormalities Oropharynx: Without any thrush or ulcers. Eyes: No scleral icterus. Lymph  nodes: No lymphadenopathy noted in the cervical, supraclavicular, or axillary nodes Heart:regular rate and rhythm, without any murmurs or gallops.   Lung: Clear to auscultation without any rhonchi, wheezes or dullness to percussion. Abdomin: Soft, nontender without any shifting dullness or ascites. Musculoskeletal: No clubbing or cyanosis. Neurological: No motor or sensory deficits. Skin: No rashes or lesions.        Lab Results: Lab Results  Component Value Date   WBC 6.2 12/31/2020   HGB 9.5 (L) 12/31/2020   HCT 29.2 (L) 12/31/2020   MCV 87.2 12/31/2020   PLT 171 12/31/2020     Chemistry      Component Value Date/Time   NA 141 09/15/2020 1154   K 4.3 09/15/2020 1154   CL 103 09/15/2020 1154   CO2 22 09/15/2020 1154   BUN 15 09/15/2020 1154   CREATININE 1.19 09/15/2020 1154      Component Value Date/Time   CALCIUM 9.4 09/15/2020 1154   ALKPHOS 17 (L) 09/15/2020 1154   AST 18 09/15/2020 1154   ALT 21 09/15/2020 1154   BILITOT 0.4 09/15/2020 1154          Impression and Plan:  80 year old man with:    1.  Multifactorial anemia diagnosed in 2019.  He had presented with iron deficiency with element of chronic disease and possible early myelodysplasia.  Laboratory data from today reviewed and continues to show mild anemia although not dramatically different than previous baseline counts.  The differential diagnosis was reviewed at this time.  Anemia of chronic disease related to previous radiation exposure versus myelodysplasia among other considerations.  At this time I recommended continued active surveillance and consider growth factor support if his hemoglobin drops below 9.  Bone marrow biopsy could be required if he has other cytopenias to evaluate for myelodysplasia.   2.  Prostate cancer diagnosed in 2018.  He status post radiation therapy without any evidence of relapsed disease.  3.  Left lower lung nodule: CT scan obtained in February 2022 was  reviewed and showed no evidence of malignancy at this time.  4.  Follow-up: In 6 months for repeat follow-up.  30  minutes were spent on this visit.  The time was dedicated to reviewing laboratory data, disease status update, discussing differential diagnosis and management choices for the future.   Zola Button, MD 6/2/20229:59 AM

## 2021-01-01 ENCOUNTER — Ambulatory Visit (INDEPENDENT_AMBULATORY_CARE_PROVIDER_SITE_OTHER): Payer: Medicare Other | Admitting: Ophthalmology

## 2021-01-01 ENCOUNTER — Encounter (INDEPENDENT_AMBULATORY_CARE_PROVIDER_SITE_OTHER): Payer: Self-pay | Admitting: Ophthalmology

## 2021-01-01 DIAGNOSIS — H2701 Aphakia, right eye: Secondary | ICD-10-CM | POA: Diagnosis not present

## 2021-01-01 DIAGNOSIS — E119 Type 2 diabetes mellitus without complications: Secondary | ICD-10-CM

## 2021-01-01 DIAGNOSIS — H35411 Lattice degeneration of retina, right eye: Secondary | ICD-10-CM

## 2021-01-01 DIAGNOSIS — I10 Essential (primary) hypertension: Secondary | ICD-10-CM

## 2021-01-01 DIAGNOSIS — T8522XS Displacement of intraocular lens, sequela: Secondary | ICD-10-CM

## 2021-01-01 DIAGNOSIS — H3581 Retinal edema: Secondary | ICD-10-CM

## 2021-01-01 DIAGNOSIS — H35033 Hypertensive retinopathy, bilateral: Secondary | ICD-10-CM

## 2021-01-01 DIAGNOSIS — Z961 Presence of intraocular lens: Secondary | ICD-10-CM

## 2021-01-01 DIAGNOSIS — H35351 Cystoid macular degeneration, right eye: Secondary | ICD-10-CM

## 2021-01-01 DIAGNOSIS — Z9889 Other specified postprocedural states: Secondary | ICD-10-CM

## 2021-01-01 DIAGNOSIS — H3321 Serous retinal detachment, right eye: Secondary | ICD-10-CM | POA: Diagnosis not present

## 2021-01-01 MED ORDER — PREDNISOLONE ACETATE 1 % OP SUSP: 1.0000 [drp] | Freq: Four times a day (QID) | OPHTHALMIC | 0 refills | Status: DC

## 2021-01-01 MED ORDER — PROLENSA 0.07 % OP SOLN: 1.0000 [drp] | Freq: Four times a day (QID) | OPHTHALMIC | 3 refills | Status: DC

## 2021-02-09 NOTE — Progress Notes (Signed)
[facility] Note  02/10/2021     CHIEF COMPLAINT Patient presents for Retina Follow Up   HISTORY OF PRESENT ILLNESS: Cody Phelps is a 80 y.o. male who presents to the clinic today for:  HPI     Retina Follow Up   Patient presents with  Other.  In right eye.  Duration of 6 weeks.  Since onset it is stable.  I, the attending physician,  performed the HPI with the patient and updated documentation appropriately.        Comments   Pt here for 6wk ret f/u CME OD. Pt states no changes, vision is about the same. No ocular pain or discomfort noted.       Last edited by [name], MD on 02/12/2021 12:57 AM.     pt states vision is stable, wife states BG is "very controlled"  Referring physician: [name], MD [place],  [place] [address]  HISTORICAL INFORMATION:   Selected notes from the [facility] Referred by Dr. [name] for concern of RD OD   CURRENT MEDICATIONS: Current Outpatient Medications (Ophthalmic Drugs)  Medication Sig   Bromfenac Sodium (PROLENSA) 0.07 % SOLN Place 1 drop into the right eye 4 (four) times daily.   prednisoLONE acetate (PRED FORTE) 1 % ophthalmic suspension Place 1 drop into the right eye 4 (four) times daily.   No current facility-administered medications for this visit. (Ophthalmic Drugs)   Current Outpatient Medications (Other)  Medication Sig   amiodarone (PACERONE) 200 MG tablet Take 200 mg by mouth daily.   atorvastatin (LIPITOR) 80 MG tablet TAKE 1 TABLET BY MOUTH  DAILY   colesevelam (WELCHOL) 625 MG tablet Take 625 mg by mouth 3 (three) times daily.    donepezil (ARICEPT) 10 MG tablet Take 10 mg by mouth every morning.    esomeprazole (NEXIUM) 40 MG capsule Take 40 mg by mouth daily.    ferrous sulfate 325 (65 FE) MG tablet TAKE 1 TABLET BY MOUTH EVERY DAY WITH BREAKFAST   gabapentin (NEURONTIN) 300 MG capsule    insulin glargine (LANTUS) 100 UNIT/ML injection Inject  20-40 Units into the skin at bedtime as needed (BGL below 140-150 take 20 units, if 151 or higher take 40 units).   memantine (NAMENDA) 5 MG tablet Take 5 mg by mouth 2 (two) times daily.    metoprolol tartrate (LOPRESSOR) 25 MG tablet TAKE 1 TABLET BY MOUTH  DAILY (Patient taking differently: Take 12.5 mg by mouth 2 (two) times daily.)   mirabegron ER (MYRBETRIQ) 50 MG TB24 tablet Take 50 mg by mouth at bedtime.    omega-3 acid ethyl esters (LOVAZA) 1 G capsule Take 2 g by mouth daily.   rOPINIRole (REQUIP) 2 MG tablet Take 2 mg by mouth 3 (three) times daily as needed (RLS).    spironolactone (ALDACTONE) 25 MG tablet TAKE ONE-HALF TABLET BY  MOUTH DAILY   tamsulosin (FLOMAX) 0.4 MG CAPS capsule Take 0.4 mg by mouth daily after supper.    torsemide (DEMADEX) 20 MG tablet Take 1 tablet (20 mg total) by mouth 2 (two) times daily as needed (Leg swelling).   valsartan-hydrochlorothiazide (DIOVAN-HCT) 80-12.5 MG tablet TAKE 1 TABLET BY MOUTH IN  THE MORNING   vitamin B-12 (CYANOCOBALAMIN) 1000 MCG tablet Take 1,000 mcg by mouth 2 (two) times daily.   XARELTO 15 MG TABS tablet TAKE 1 TABLET BY MOUTH IN  THE EVENING AFTER DINNER   No current facility-administered  medications for this visit. (Other)      REVIEW OF SYSTEMS: ROS   Positive for: Musculoskeletal, Endocrine, Cardiovascular, Eyes, Respiratory Negative for: Constitutional, Gastrointestinal, Neurological, Skin, Genitourinary, HENT, Psychiatric, Allergic/Imm, Heme/Lymph Last edited by Kingsley Spittle, COT on 02/10/2021  1:25 PM.        ALLERGIES No Known Allergies  PAST MEDICAL HISTORY Past Medical History:  Diagnosis Date   Acute anterior wall MI ([place]) 03/01/2018   Acute combined systolic and diastolic heart failure ([place]) 03/15/2018   Burn    Chronic diastolic CHF (congestive heart failure) ([place]) 09/21/2018   Defect, retina, with detachment    Dementia ([place])    Diverticulosis    ED (erectile dysfunction)    First degree  heart block    GERD (gastroesophageal reflux disease)    Heart murmur    History of blood transfusion    with open heart surgery   Hypertension    cardiologsit-  dr [name]   Hypertensive retinopathy    OU   Iron deficiency anemia    Mixed hyperlipidemia    Morbid obesity ([place])    Myocardial infarction ([place])    02/28/18   OA (osteoarthritis)    right hip   OSA on CPAP    followed by dr dohmeier, uses CPAP   PAF (paroxysmal atrial fibrillation) ([place])    a. on Eliquis   Prostate cancer ([place])    dx 03-17-2017 (bx)  Stage T2a, Gleason 4+4, PSA  4.43, vol 32.32--  s/p radiatctive prostate seed implants 07-10-2017 then IMRT and ADT   Retinal detachment    Rheg. 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   LIMA to Diagonal Branch   S/P Maze operation for atrial fibrillation 05/30/2018   Complete bilateral atrial lesion set using bipolar radiofrequency and cryothermy ablation with clipping of LA appendage   Scoliosis    Trigger finger    Type 2 diabetes mellitus ([place])    Wears partial dentures    upper and lower   Past Surgical History:  Procedure Laterality Date   AORTIC VALVE REPLACEMENT N/A 05/30/2018   Procedure: AORTIC VALVE REPLACEMENT (AVR) using Inspiris Valve, Size 23;  Surgeon: [name], MD;  Location: [facility];  Service: Open Heart Surgery;  Laterality: N/A;   APPENDECTOMY  1988   BILATERAL TOTAL ETHMOIDECTOMY AND SPHENOIDECTOMY  01-14-2009    dr Redmond Baseman   [facility]   CARDIOVERSION N/A 08/10/2018   Procedure: CARDIOVERSION;  Surgeon: Adrian Prows, MD;  Location: [facility];  Service: Cardiovascular;  Laterality: N/A;   CARPAL TUNNEL RELEASE Bilateral 2013   CATARACT EXTRACTION Bilateral    Dr. [name]   CATARACT EXTRACTION W/ INTRAOCULAR LENS  IMPLANT, BILATERAL  date?   CORONARY ARTERY BYPASS GRAFT N/A 05/30/2018    CORONARY BALLOON ANGIOPLASTY N/A 03/01/2018   Procedure: CORONARY BALLOON ANGIOPLASTY;  Surgeon: Adrian Prows, MD;  Location: [facility];  Service: Cardiovascular;  Laterality: N/A;   CORONARY/GRAFT ACUTE MI REVASCULARIZATION N/A 03/01/2018   Procedure: Coronary/Graft Acute MI Revascularization;  Surgeon: Adrian Prows, MD;  Location: [facility];  Service: Cardiovascular;  Laterality: N/A;   CYSTOSCOPY  07/19/2017   Procedure: CYSTOSCOPY;  Surgeon: Nickie Retort, MD;  Location: [facility];  Service: Urology;;  No seeds found in [place]  Bilateral    Cat Sx OU.  Dislocated IOL sx OD.  Rheg. RD repair OS.   EYE SURGERY     GAS/FLUID EXCHANGE Right 06/13/2019   Procedure: GAS/FLUID EXCHANGE;  Surgeon: [name], MD;  Location: [facility];  Service: Ophthalmology;  Laterality: Right;   LAMINECTOMY WITH POSTERIOR LATERAL ARTHRODESIS LEVEL 2 N/A 02/12/2014   Procedure: LUMBAR TWO-THREE,LUIMBAR THREE-FOUR LAMINECTOMY/FORAMINOTOMY;POSSIBLE POSTEROLATERAL ARTHRODESIS WITH AUTOGRAFT;  Surgeon: [name], MD;  Location: [facility];  Service: Neurosurgery;  Laterality: N/A;   LEFT HEART CATH AND CORONARY ANGIOGRAPHY N/A 03/01/2018   Procedure: LEFT HEART CATH AND CORONARY ANGIOGRAPHY;  Surgeon: Adrian Prows, MD;  Location: [facility];  Service: Cardiovascular;  Laterality: N/A;   MAZE N/A 05/30/2018   Procedure: MAZE;  Surgeon: [name], MD;  Location: [facility];  Service: Open Heart Surgery;  Laterality: N/A;   ORIF RIGHT ANKLE FX'S  05/05/2001   retained hardware   PARS PLANA VITRECTOMY Right 06/13/2019   Procedure: PARS PLANA VITRECTOMY WITH 25 GAUGE WITH LASER AND GAS;  Surgeon: [name], MD;  Location: [facility];  Service: Ophthalmology;  Laterality: Right;   PARS PLANA VITRECTOMY Right 09/12/2019   Procedure: Pars Plana Vitrectomy With 25 Gauge;  Surgeon: [name], MD;  Location: [facility];  Service: Ophthalmology;  Laterality: Right;    PERFLUORONE INJECTION Right 06/13/2019   Procedure: PERFLUORONE INJECTION;  Surgeon: [name], MD;  Location: [facility];  Service: Ophthalmology;  Laterality: Right;   PHOTOCOAGULATION WITH LASER Right 06/13/2019   Procedure: PHOTOCOAGULATION WITH LASER;  Surgeon: [name], MD;  Location: [facility];  Service: Ophthalmology;  Laterality: Right;   PLACEMENT AND SUTURE OF SECONDARY INTRAOCULAR LENS Right 09/12/2019   Procedure: PLACEMENT AND SUTURE OF SECONDARY INTRAOCULAR LENS;  Surgeon: [name], MD;  Location: [facility];  Service: Ophthalmology;  Laterality: Right;   PROSTATE BIOPSY  03-17-2017   dr Pilar Jarvis office   RADIOACTIVE SEED IMPLANT N/A 07/19/2017   Procedure: RADIOACTIVE SEED IMPLANT/BRACHYTHERAPY IMPLANT;  Surgeon: Nickie Retort, MD;  [facility];  Service: Urology;  Laterality: N/A;  69 seeds implanted   REMOVAL RETAINED LENS Right 09/12/2019   Procedure: REMOVAL RETAINED LENS;  Surgeon: [name], MD;  Location: [facility];  Service: Ophthalmology;  Laterality: Right;   RETINAL DETACHMENT SURGERY Right 06/13/2019   Dr. Coralyn Pear   SKIN GRAFT     face   SPACE OAR INSTILLATION N/A 07/19/2017   Procedure: SPACE OAR INSTILLATION;  Surgeon: Nickie Retort, MD;  Location: [facility];  Service: Urology;  Laterality: N/A;   TEE WITHOUT CARDIOVERSION  03/20/2012   Procedure: TRANSESOPHAGEAL ECHOCARDIOGRAM (TEE);  Surgeon: Laverda Page, MD;  Location: [facility];  Service: Cardiovascular;  Laterality: N/A;  normal LV; normal EF; normal RV; normal LA w/ left atrial appendage very small, normal function, interatrial septum intact without defect; normal RA; trace MR,TR, & PI; mild AV calcification and senile degeneration w/ mild stenosis, AVA 1.7cm^2;;    TEE WITHOUT CARDIOVERSION N/A 05/30/2018   Procedure: TRANSESOPHAGEAL ECHOCARDIOGRAM (TEE);  Surgeon: [name], MD;  Location: [facility];  Service: Open Heart Surgery;  Laterality: N/A;    TOTAL HIP ARTHROPLASTY Right 10/23/2017   Procedure: TOTAL HIP ARTHROPLASTY ANTERIOR APPROACH;  Surgeon: Frederik Pear, MD;  Location: [facility];  Service: Orthopedics;  Laterality: Right;   TRANSTHORACIC ECHOCARDIOGRAM  01-11-2017   dr [name]  (per echo note, no significant change in seveity of AS, no other  diagnostic change)   moderate concentric LVH, ef 67%, grade 1 diastolic dysfunction/  moderate LAE/  mild , grade 1 AR w/ moderate AV calcification, mild to moderate restricted AV leaflets w/ moderate AS, AVA 1.16cm^2, peak grandient 107mHg, mean grandient 3259mg/  trace MR, mild calcification MV annulus , mild MV leaflet calcification, mild MVS, peak grandient 4.59m32m, mean grandient 2.7mm37m trace TR    FAMILY HISTORY Family History  Problem Relation Age of Onset   Stroke Mother    Hypertension Mother    Heart attack Father    Heart murmur Father    Hypertension Sister    Lung disease Brother    Colon cancer Neg Hx    Stomach cancer Neg Hx    Rectal cancer Neg Hx    Pancreatic cancer Neg Hx     SOCIAL HISTORY Social History   Tobacco Use   Smoking status: Former    Packs/day: 0.25    Years: 1.00    Pack years: 0.25    Types: Cigarettes    Quit date: 02/04/1967    Years since quitting: 54.0   Smokeless tobacco: Never   Tobacco comments:    smoked 1 q2-3 days  Vaping Use   Vaping Use: Never used  Substance Use Topics   Alcohol use: Yes    Comment: occasional beer   Drug use: No         OPHTHALMIC EXAM:  Base Eye Exam     Visual Acuity (Snellen - Linear)       Right Left   Dist [place] 20/40 -1 20/30 -1   Dist ph [place] 20/25 20/20         Tonometry (Tonopen, 1:37 PM)       Right Left   Pressure 15 14         Pupils       Dark Light Shape React APD   Right 4 3 Round Minimal None   Left 4 3 Round Minimal None         Visual Fields (Counting fingers)       Left Right    Full Full         Extraocular Movement       Right Left    Full, Ortho Full,  Ortho         Neuro/Psych     Oriented x3: Yes   Mood/Affect: Normal         Dilation     Both eyes: 1.0% Mydriacyl, 2.5% Phenylephrine @ 1:39 PM           Slit Lamp and Fundus Exam     Slit Lamp Exam       Right Left   Lids/Lashes mild Meibomian gland dysfunction, mild Telangiectasia Dermatochalasis - upper lid, Meibomian gland dysfunction, Edema, Erythema lower lid, positive concretion left palp conj   Conjunctiva/Sclera White and quiet; STK gone in ST quadrant Temporal Pinguecula   Cornea well healed superior corneal wound, trace Punctate epithelial erosions, Arcus, mild tear film debris, fine endo pigment 1+ Punctate epithelial erosions, +verticellata, trace endo pigment, Debris in tear film   Anterior Chamber Deep, 0.5+ pigment Deep and quiet   Iris Round and dilated to 6mm,31mattered Transillumination defects 360, +iridodenesis, +pigment deposition Round and dilated   Lens Sutured Akreos IOL well centered  3 piece Posterior chamber intraocular in good position, trace Posterior capsular opacification   Vitreous post vitrectomy, trace pigment Vitreous syneresis, Posterior vitreous detachment  Fundus Exam       Right Left   Disc 1+Pallor, Sharp rim 2+Pallor, Sharp rim, temporal PPP, mild tilt   C/D Ratio 0.3 0.5   Macula Flat, Blunted foveal reflex, mild RPE mottling and clumping, persistent central cystic changes, +pigment deposition on retinal surface, scattered MA Flat, Blunted foveal reflex, Retinal pigment epithelial mottling and clumping, No heme or edema   Vessels Mild Vascular attenuation, mild Tortuousity, mild A/V crossing changes Vascular attenuation, mild Tortuous   Periphery attached, good 360 laser in place; ORIGINALLY: bullous superior retinal detachment from 1000-0130, another more shallow detachment lobe from 130-400, lattice and micro tears ST quadrant; Old retinal tear at 0900 with surrounding laser Attached, peripheral laser scars at 1030, No  new RT/RD           Refraction     Wearing Rx       Sphere Cylinder Axis Add   Right -1.25 +1.50 025 +2.75   Left -1.00 +1.75 180 +2.75            IMAGING AND PROCEDURES  Imaging and Procedures for _0 @  OCT, Retina - OU - Both Eyes       Right Eye Quality was good. Central Foveal Thickness: 289. Progression has been stable. Findings include normal foveal contour, no SRF, intraretinal fluid (Persistent cystic changes nasal fovea; trace ERM).   Left Eye Quality was good. Central Foveal Thickness: 268. Progression has been stable. Findings include normal foveal contour, no IRF, no SRF.   Notes *Images captured and stored on drive  Diagnosis / Impression:  OD: Persistent cystic changes nasal fovea; trace ERM OS: NFP, no IRF/SRF   Clinical management:  See below  Abbreviations: NFP - Normal foveal profile. CME - cystoid macular edema. PED - pigment epithelial detachment. IRF - intraretinal fluid. SRF - subretinal fluid. EZ - ellipsoid zone. ERM - epiretinal membrane. ORA - outer retinal atrophy. ORT - outer retinal tubulation. SRHM - subretinal hyper-reflective material               ASSESSMENT/PLAN:    ICD-10-CM   1. Dislocation of intraocular lens, sequela  T85.22XS     2. Aphakia of right eye  H27.01     3. Right retinal detachment  H33.21     4. 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 Retinal edema  H35.81 OCT, Retina - OU - Both Eyes    6. History of repair of retinal tear by laser photocoagulation  Z98.890     7. Diabetes mellitus type 2 without retinopathy ([place])  E11.9     8. Essential hypertension  I10     9. Hypertensive retinopathy of both eyes  H35.033     10. Pseudophakia of both eyes  Z96.1     11. Cystoid macular edema of right eye  H35.351       1,2. Dislocated IOL OD  - pt with history of partially dislocated 3-piece IOL -- spontaneously dislocated completely into vitreous cavity on 1.23.21  - s/p 25g PPV  w/ IOL explantation and secondary sutured IOL OD -- Akreos AO60 lens, 17.5D -- 02.11.2021             - IOL in good position  - 2 superior nylon sutures removed at slit lamp 03.12.21 -- last nylon suture removed at slit lamp, 04.30.21  - corneal edema resolved; PEE improved             - s/p STK OD #  1 (07.09.21), #2 (01.26.22) for CME  - BCVA 20/25 from 20/30  - OCT shows persistent IRF/CME (see #11 below)  - IOP okay at 19  - ATs QID OU              - f/u 4-6 wks -- DFE/OCT   3-5. Rhegmatogenous retinal detachment, right eye  - bullous, superior, mac off detachment, onset of foveal involvement 11.11.20 by history  - Bi-lobed superior detachment, superior lobe spanning 1000-0130, nasal lobe 0130-0400  - lattice degeneration with microtears noted at 1030  - s/p PPV/PFC/EL/FAX/14% C3F8 OD, 11.12.20             - intraop: HST at 2 oclock was found; also detachment had progressed to subtotal detachment spanning 9 oclock to 6 oclock (going in clockwise direction); also severe zonular insufficiency with IOL very mobile throughout case -- did not dislocate completely during the case or immediately post op  - doing well              - retina attached and in good position--good laser in place  6. History of retinal defects s/p laser retinopexy OU with Dr. Zigmund Daniel in 2013  - OD laser at 0900  - OS laser at 1030  - stable  7. Diabetes mellitus, type 2 without retinopathy  - The incidence, risk factors for progression, natural history and treatment options for diabetic retinopathy  were discussed with patient.    - The need for close monitoring of blood glucose, blood pressure, and serum lipids, avoiding cigarette or any type of tobacco, and the need for long term follow up was also discussed with patient  8,9. Hypertensive retinopathy OU  - discussed importance of tight BP control  - monitor  10. Pseudophakia OU  - s/p CE/IOL OU (Dr. [name])  - 3 piece IOL OD was completely displaced into  vitreous -- now with sutured Akreos IOL in good position OD -- see above  - OS 3-piece IOL in good position  - monitor  11. CME OD  - post op edema  - s/p STK OD #1 07.09.21, #2 (01.26.22)  - interval increase in IRF/CME on OCT 04.25.22   - today, 06.03.22, tr persistent IRF/CME -- mild cystic changes  - cont PF QID OD and Prolensa QID OD  - f/u 6 weeks DFE, OCT, possible STK injection   Ophthalmic Meds Ordered this visit:  No orders of the defined types were placed in this encounter.      Return in 6 weeks (on 03/24/2021) for f/u 6 weeks, CME OD, DFE, OCT.  There are no Patient Instructions on file for this visit.  This document serves as a record of services personally performed by Gardiner Sleeper, MD, PhD. It was created on their behalf by Roselee Nova, COMT. The creation of this record is the provider's dictation and/or activities during the visit.  Electronically signed by: Roselee Nova, COMT 02/12/21 1:04 AM  This document serves as a record of services personally performed by Gardiner Sleeper, MD, PhD. It was created on their behalf by San Jetty. Owens Shark, OA an ophthalmic technician. The creation of this record is the provider's dictation and/or activities during the visit.    Electronically signed by: San Jetty. Owens Shark, [place] 07.13.2022 1:04 AM   Gardiner Sleeper, M.D., Ph.D. Diseases & Surgery of the Retina and Vitreous [facility]  I have reviewed the above documentation for accuracy and completeness, and I agree with the  above. Gardiner Sleeper, M.D., Ph.D. 02/12/21 1:04 AM   Abbreviations: M myopia (nearsighted); A astigmatism; H hyperopia (farsighted); P presbyopia; Mrx spectacle prescription;  CTL contact lenses; OD right eye; OS left eye; OU both eyes  XT exotropia; ET esotropia; PEK punctate epithelial keratitis; PEE punctate epithelial erosions; DES dry eye syndrome; MGD meibomian gland dysfunction; ATs artificial tears; PFAT's preservative free  artificial tears; [place] nuclear sclerotic cataract; PSC posterior subcapsular cataract; ERM epi-retinal membrane; PVD posterior vitreous detachment; RD retinal detachment; DM diabetes mellitus; DR diabetic retinopathy; NPDR non-proliferative diabetic retinopathy; PDR proliferative diabetic retinopathy; CSME clinically significant macular edema; DME diabetic macular edema; dbh dot blot hemorrhages; CWS cotton wool spot; POAG primary open angle glaucoma; C/D cup-to-disc ratio; HVF humphrey visual field; GVF goldmann visual field; OCT optical coherence tomography; IOP intraocular pressure; BRVO Branch retinal vein occlusion; CRVO central retinal vein occlusion; CRAO central retinal artery occlusion; BRAO branch retinal artery occlusion; RT retinal tear; SB scleral buckle; PPV pars plana vitrectomy; VH Vitreous hemorrhage; PRP panretinal laser photocoagulation; IVK intravitreal kenalog; VMT vitreomacular traction; MH Macular hole;  NVD neovascularization of the disc; NVE neovascularization elsewhere; AREDS age related eye disease study; ARMD age related macular degeneration; POAG primary open angle glaucoma; EBMD epithelial/anterior basement membrane dystrophy; ACIOL anterior chamber intraocular lens; IOL intraocular lens; PCIOL posterior chamber intraocular lens; Phaco/IOL phacoemulsification with intraocular lens placement; [place] photorefractive keratectomy; LASIK laser assisted in situ keratomileusis; HTN hypertension; DM diabetes mellitus; COPD chronic obstructive pulmonary disease

## 2021-02-10 ENCOUNTER — Ambulatory Visit (INDEPENDENT_AMBULATORY_CARE_PROVIDER_SITE_OTHER): Payer: Medicare Other | Admitting: Ophthalmology

## 2021-02-10 ENCOUNTER — Encounter (INDEPENDENT_AMBULATORY_CARE_PROVIDER_SITE_OTHER): Payer: Self-pay | Admitting: Ophthalmology

## 2021-02-10 ENCOUNTER — Other Ambulatory Visit: Payer: Self-pay

## 2021-02-10 DIAGNOSIS — E119 Type 2 diabetes mellitus without complications: Secondary | ICD-10-CM | POA: Diagnosis not present

## 2021-02-10 DIAGNOSIS — I10 Essential (primary) hypertension: Secondary | ICD-10-CM

## 2021-02-10 DIAGNOSIS — H2701 Aphakia, right eye: Secondary | ICD-10-CM | POA: Diagnosis not present

## 2021-02-10 DIAGNOSIS — H35411 Lattice degeneration of retina, right eye: Secondary | ICD-10-CM

## 2021-02-10 DIAGNOSIS — Z9889 Other specified postprocedural states: Secondary | ICD-10-CM

## 2021-02-10 DIAGNOSIS — T8522XS Displacement of intraocular lens, sequela: Secondary | ICD-10-CM

## 2021-02-10 DIAGNOSIS — H3581 Retinal edema: Secondary | ICD-10-CM | POA: Diagnosis not present

## 2021-02-10 DIAGNOSIS — H35033 Hypertensive retinopathy, bilateral: Secondary | ICD-10-CM

## 2021-02-10 DIAGNOSIS — H35351 Cystoid macular degeneration, right eye: Secondary | ICD-10-CM

## 2021-02-10 DIAGNOSIS — H3321 Serous retinal detachment, right eye: Secondary | ICD-10-CM | POA: Diagnosis not present

## 2021-02-10 DIAGNOSIS — Z961 Presence of intraocular lens: Secondary | ICD-10-CM

## 2021-02-12 ENCOUNTER — Encounter (INDEPENDENT_AMBULATORY_CARE_PROVIDER_SITE_OTHER): Payer: Self-pay | Admitting: Ophthalmology

## 2021-03-03 ENCOUNTER — Other Ambulatory Visit: Payer: Self-pay

## 2021-03-03 ENCOUNTER — Ambulatory Visit: Payer: Medicare Other | Admitting: Cardiology

## 2021-03-03 ENCOUNTER — Encounter: Payer: Self-pay | Admitting: Cardiology

## 2021-03-03 VITALS — BP 127/65 | HR 61 | Temp 97.8°F | Resp 17 | Ht 68.0 in | Wt 212.4 lb

## 2021-03-03 DIAGNOSIS — I5032 Chronic diastolic (congestive) heart failure: Secondary | ICD-10-CM

## 2021-03-03 DIAGNOSIS — Z953 Presence of xenogenic heart valve: Secondary | ICD-10-CM

## 2021-03-03 DIAGNOSIS — I10 Essential (primary) hypertension: Secondary | ICD-10-CM

## 2021-03-03 DIAGNOSIS — I25118 Atherosclerotic heart disease of native coronary artery with other forms of angina pectoris: Secondary | ICD-10-CM

## 2021-03-03 DIAGNOSIS — I484 Atypical atrial flutter: Secondary | ICD-10-CM

## 2021-03-03 MED ORDER — NITROGLYCERIN 0.4 MG SL SUBL: 0.4000 mg | SUBLINGUAL_TABLET | SUBLINGUAL | 3 refills | Status: AC | PRN

## 2021-03-03 MED ORDER — TORSEMIDE 20 MG PO TABS: 20.0000 mg | ORAL_TABLET | ORAL | 1 refills | Status: AC

## 2021-03-03 NOTE — Progress Notes (Signed)
Primary Physician/Referring:  [name], MD  Patient ID: Cody Phelps, male    DOB: Apr 06, 1941, 80 y.o.   MRN: [id number]  Chief Complaint  Patient presents with   Coronary Artery Disease   chronic diastolic heart failure    6 month    HPI:    HPI: Cody Phelps  is a 80 y.o. Caucasian male h/o aortic valve replacement along with LIMA to D1 on 05/30/2018, invasive prostate cancer diagnosed in Nov 2018 and is S/P chemo and RT and in remission, anemia of chronic disease and also mild iron defeciency, hypertension, hyperlipidemia, obstructive sleep apnea on CPAP, controlled diabetes mellitus, mild obesity, mostly truncal obesity. 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   He is also being followed by oncology Cody Blew, MD) for anemia. 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  He presents here for 2-monthoffice visit.  He has been having worsening dyspnea over the past couple months.  No chest pain, no dizziness or syncope.  Tolerating anticoagulation without bleeding diathesis.  Accompanied by his wife.  Past Medical History:  Diagnosis Date   Acute anterior wall MI ([place] 03/01/2018   Acute combined systolic and diastolic heart failure ([place] 03/15/2018   Burn    Chronic diastolic CHF (congestive heart failure) ([place] 09/21/2018   Defect, retina, with detachment    Dementia ([place]    Diverticulosis    ED (erectile dysfunction)    First degree heart block    GERD (gastroesophageal reflux disease)    Heart murmur    History of blood transfusion    with open heart surgery   Hypertension    cardiologsit-  dr gEinar Gip  Hypertensive retinopathy    OU   Iron deficiency anemia    Mixed hyperlipidemia    Morbid obesity ([place]    Myocardial infarction ([place]    02/28/18   OA (osteoarthritis)    right hip   OSA on CPAP    followed by dr dohmeier, uses CPAP   PAF (paroxysmal atrial fibrillation) ([place]    a.  on Eliquis   Prostate cancer ([place]    dx 03-17-2017 (bx)  Stage T2a, Gleason 4+4, PSA  4.43, vol 32.32--  s/p radiatctive prostate seed implants 07-10-2017 then IMRT and ADT   Retinal detachment    Rheg. 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   LIMA to Diagonal Branch   S/P Maze operation for atrial fibrillation 05/30/2018   Complete bilateral atrial lesion set using bipolar radiofrequency and cryothermy ablation with clipping of LA appendage   Scoliosis    Trigger finger    Type 2 diabetes mellitus ([place]    Wears partial dentures    upper and lower   Past Surgical History:  Procedure Laterality Date   AORTIC VALVE REPLACEMENT N/A 05/30/2018   Procedure: AORTIC VALVE REPLACEMENT (AVR) using Inspiris Valve, Size 23;  Surgeon: ORexene Alberts MD;  [place]  Service: Open Heart Surgery;  Laterality: N/A;   APPENDECTOMY  1988   BILATERAL TOTAL ETHMOIDECTOMY AND SPHENOIDECTOMY  01-14-2009    [facility]  CARDIOVERSION N/A 08/10/2018   Procedure: CARDIOVERSION;  Surgeon: GAdrian Prows MD;  [place]  Service: Cardiovascular;  Laterality: N/A;   CARPAL  TUNNEL RELEASE Bilateral 2013   CATARACT EXTRACTION Bilateral    Dr. [name]   CATARACT EXTRACTION W/ INTRAOCULAR LENS  IMPLANT, BILATERAL  date?   CORONARY ARTERY BYPASS GRAFT N/A 05/30/2018   CORONARY BALLOON ANGIOPLASTY N/A 03/01/2018   Procedure: CORONARY BALLOON ANGIOPLASTY;  Surgeon: Adrian Prows, MD;  Location: [place] [facility];  Service: Cardiovascular;  Laterality: N/A;   CORONARY/GRAFT ACUTE MI REVASCULARIZATION N/A 03/01/2018   Procedure: Coronary/Graft Acute MI Revascularization;  Surgeon: Adrian Prows, MD;  Location: [place] [facility];  Service: Cardiovascular;  Laterality: N/A;   CYSTOSCOPY  07/19/2017   Procedure: CYSTOSCOPY;  Surgeon: Nickie Retort, MD;  Location: [facility];  Service: Urology;;  No seeds found in [place] Bilateral    Cat Sx OU.  Dislocated IOL sx OD.  Rheg. RD repair OS.   EYE SURGERY     GAS/FLUID EXCHANGE Right 06/13/2019   Procedure: GAS/FLUID EXCHANGE;  Surgeon: [name], MD;  Location: [place];  Service: Ophthalmology;  Laterality: Right;   LAMINECTOMY WITH POSTERIOR LATERAL ARTHRODESIS LEVEL 2 N/A 02/12/2014   Procedure: LUMBAR TWO-THREE,LUIMBAR THREE-FOUR LAMINECTOMY/FORAMINOTOMY;POSSIBLE POSTEROLATERAL ARTHRODESIS WITH AUTOGRAFT;  Surgeon: [name], MD;  [facility];  Service: Neurosurgery;  Laterality: N/A;   LEFT HEART CATH AND CORONARY ANGIOGRAPHY N/A 03/01/2018   Procedure: LEFT HEART CATH AND CORONARY ANGIOGRAPHY;  Surgeon: Adrian Prows, MD;  Location: [place] [facility];  Service: Cardiovascular;  Laterality: N/A;   MAZE N/A 05/30/2018   Procedure: MAZE;  Surgeon: [name], MD;  Location: [place];  Service: Open Heart Surgery;  Laterality: N/A;   ORIF RIGHT ANKLE FX'S  05/05/2001   retained hardware   PARS PLANA VITRECTOMY Right 06/13/2019   Procedure: PARS PLANA VITRECTOMY WITH 25 GAUGE WITH LASER AND GAS;  Surgeon: [name], MD;  Location: [place];  Service: Ophthalmology;  Laterality: Right;   PARS PLANA VITRECTOMY Right 09/12/2019   Procedure: Pars Plana Vitrectomy With 25 Gauge;  Surgeon: [name], MD;  Location: [place];  Service: Ophthalmology;  Laterality: Right;   PERFLUORONE INJECTION Right 06/13/2019   Procedure: PERFLUORONE INJECTION;  Surgeon: [name], MD;  Location: [place];  Service: Ophthalmology;  Laterality: Right;   PHOTOCOAGULATION WITH LASER Right 06/13/2019   Procedure: PHOTOCOAGULATION WITH LASER;  Surgeon: [name], MD;  Location: [place];  Service: Ophthalmology;  Laterality: Right;   PLACEMENT AND SUTURE OF SECONDARY INTRAOCULAR LENS Right 09/12/2019   Procedure: PLACEMENT AND SUTURE OF SECONDARY  INTRAOCULAR LENS;  Surgeon: [name], MD;  Location: [place];  Service: Ophthalmology;  Laterality: Right;   PROSTATE BIOPSY  03-17-2017   dr Pilar Jarvis office   RADIOACTIVE SEED IMPLANT N/A 07/19/2017   Procedure: RADIOACTIVE SEED IMPLANT/BRACHYTHERAPY IMPLANT;  Surgeon: Nickie Retort, MD;  Location[facility];  Service: Urology;  Laterality: N/A;  69 seeds implanted   REMOVAL RETAINED LENS Right 09/12/2019   Procedure: REMOVAL RETAINED LENS;  Surgeon: [name], MD;  Location: [place];  Service: Ophthalmology;  Laterality: Right;   RETINAL DETACHMENT SURGERY Right 06/13/2019   Dr. Coralyn Pear   SKIN GRAFT     face   SPACE OAR INSTILLATION N/A 07/19/2017   Procedure: SPACE OAR INSTILLATION;  Surgeon: Nickie Retort, MD;  [facility];  Service: Urology;  Laterality: N/A;   TEE WITHOUT CARDIOVERSION  03/20/2012   Procedure: TRANSESOPHAGEAL ECHOCARDIOGRAM (TEE);  Surgeon: Laverda Page, MD;  Location: [place];  Service: Cardiovascular;  Laterality: N/A;  normal LV; normal EF; normal RV; normal LA w/ left atrial appendage very small, normal function, interatrial septum intact without defect; normal RA; trace MR,TR, & PI; mild AV calcification and senile degeneration w/ mild stenosis, AVA 1.7cm^2;;    TEE WITHOUT CARDIOVERSION N/A 05/30/2018   Procedure: TRANSESOPHAGEAL ECHOCARDIOGRAM (TEE);  Surgeon: [name], MD;  Location: [place];  Service: Open Heart Surgery;  Laterality: N/A;   TOTAL HIP ARTHROPLASTY Right 10/23/2017   Procedure: TOTAL HIP ARTHROPLASTY ANTERIOR APPROACH;  Surgeon: Frederik Pear, MD;  Location: [place];  Service: Orthopedics;  Laterality: Right;   TRANSTHORACIC ECHOCARDIOGRAM  01-11-2017   dr [name]  (per echo note, no significant change in seveity of AS, no other diagnostic change)   moderate concentric LVH, ef 90%, grade 1 diastolic dysfunction/  moderate LAE/  mild , grade 1 AR w/ moderate AV calcification, mild to  moderate restricted AV leaflets w/ moderate AS, AVA 1.16cm^2, peak grandient 4mHg, mean grandient 364mg/  trace MR, mild calcification MV annulus , mild MV leaflet calcification, mild MVS, peak grandient 4.2m66m, mean grandient 2.7mm62m trace TR   Social History   Tobacco Use   Smoking status: Former    Packs/day: 0.25    Years: 1.00    Pack years: 0.25    Types: Cigarettes    Quit date: 02/04/1967    Years since quitting: 54.1   Smokeless tobacco: Never   Tobacco comments:    smoked 1 q2-3 days  Substance Use Topics   Alcohol use: Yes    Comment: occasional beer   Marital Status: Married   ROS  Review of Systems  Cardiovascular:  Positive for dyspnea on exertion and leg swelling. Negative for chest pain and orthopnea.  Musculoskeletal:  Positive for arthritis and back pain.  Gastrointestinal:  Negative for melena.  Neurological:  Positive for disturbances in coordination and dizziness.  Objective  Blood pressure 127/65, pulse 61, temperature 97.8 F (36.6 C), temperature source Temporal, resp. rate 17, height _0  (1.727 m), weight 212 lb 6.4 oz (96.3 kg), SpO2 96 %. Body mass index is 32.3 kg/m.   Vitals with BMI 03/03/2021 12/31/2020 09/21/2020  Height _1  - _2   Weight 212 lbs 6 oz 214 lbs 10 oz 216 lbs  BMI [id number] 127 291 916 606astolic 65 63 62  Pulse 61 63 62    Physical Exam Constitutional:      General: He is not in acute distress.    Appearance: He is well-developed. He is obese.  Neck:     Thyroid: No thyromegaly.     Vascular: No JVD.  Cardiovascular:     Rate and Rhythm: Normal rate and regular rhythm.     Pulses: Intact distal pulses.          Carotid pulses are 2+ on the right side with bruit and 2+ on the left side with bruit.      Popliteal pulses are 2+ on the right side and 2+ on the left side.       Dorsalis pedis pulses are 2+ on the right side and 1+ on the left side.       Posterior tibial pulses are 0 on the right side and 0  on the left side.     Heart sounds: Murmur heard.  Early systolic murmur is present with a grade of 2/6 at the upper right sternal border radiating to the apex.  No gallop.     Comments: Leg edema pitting trace+ bilateral below knee.  Pulmonary:     Effort: Pulmonary effort is normal.     Breath sounds: Normal breath sounds.  Abdominal:     General: Bowel sounds are normal.     Palpations: Abdomen is soft.  Musculoskeletal:        General: Swelling (2+ bilateral below knee pitting edema) present. Normal range of motion.  Neurological:     General: No focal deficit present.   Radiology: No results found.  Laboratory examination:   CMP Latest Ref Rng & Units 09/15/2020 09/12/2019 06/13/2019  Glucose 65 - 99 mg/dL 94 134(H) 85  BUN 8 - 27 mg/dL _0 Creatinine 0.76 - 1.27 mg/dL 1.19 1.38(H) 1.10  Sodium 134 - 144 mmol/L 141 137 138  Potassium 3.5 - 5.2 mmol/L 4.3 3.7 4.0  Chloride 96 - 106 mmol/L 103 106 108  CO2 20 - 29 mmol/L 22 22 21(L)  Calcium 8.6 - 10.2 mg/dL 9.4 9.0 9.3  Total Protein 6.0 - 8.5 g/dL 7.0 - -  Total Bilirubin 0.0 - 1.2 mg/dL 0.4 - -  Alkaline Phos 44 - 121 IU/L 17(L) - -  AST 0 - 40 IU/L 18 - -  ALT 0 - 44 IU/L 21 - -   CBC Latest Ref Rng & Units 12/31/2020 09/15/2020 07/02/2020  WBC 4.0 - 10.5 K/uL 6.2 6.3 5.3  Hemoglobin 13.0 - 17.0 g/dL 9.5(L) 10.6(L) 9.5(L)  Hematocrit 39.0 - 52.0 % 29.2(L) 32.7(L) 29.2(L)  Platelets 150 - 400 K/uL 171 191 153    Lipid Panel Recent Labs    09/15/20 1154  CHOL 142  TRIG 101  LDLCALC 70  HDL 53    Lipid Panel     Component Value Date/Time   CHOL 142 09/15/2020 1154   TRIG 101 09/15/2020 1154   HDL 53 09/15/2020 1154   CHOLHDL 2.7 03/01/2018 0514   VLDL 14 03/01/2018 0514   LDLCALC 70 09/15/2020 1154   HEMOGLOBIN A1C Lab Results  Component Value Date   HGBA1C 5.8 (H) 05/28/2018   MPG 119.76 05/28/2018   External labs:  Cholesterol, total 143.000 m 01/07/2020 HDL 45 MG/DL 01/07/2020 LDL 70.000 mg  01/07/2020 Triglycerides 142.000 01/07/2020  A1C 6.000 % 01/07/2020 TSH 1.000 01/07/2020  Hemoglobin 10.000 g/d 01/07/2020 Platelets 176.000 01/01/2020   Creatinine, Serum 1.400 mg/ 01/07/2020 Potassium 3.700 09/12/2019 Magnesium N/D ALT (SGPT) 18.000 uni 01/07/2020  Labs 01/02/2019: A1c 5.8%.  Serum glucose 98 mg, BUN 16, creatinine 1.0, eGFR 72 mL, potassium 3.6, CMP otherwise normal.   HB 8.3/HCT 25.9, platelets 182, normal indicis.   Total cholesterol 98, triglycerides 57, HDL 42, LDL 45.  TSH normal.  Medications   Current Outpatient Medications on File Prior to Visit  Medication Sig Dispense Refill   atorvastatin (LIPITOR) 80 MG tablet TAKE 1 TABLET BY MOUTH  DAILY 90 tablet 3   Bromfenac Sodium (PROLENSA) 0.07 % SOLN Place 1 drop into the right eye 4 (four) times daily. 3 mL 3   colesevelam (WELCHOL) 625 MG tablet Take 625 mg by mouth 3 (three) times daily.      donepezil (ARICEPT) 10 MG tablet Take 10 mg by mouth every morning.      esomeprazole (NEXIUM) 40 MG capsule Take 40 mg by mouth daily.      ferrous sulfate 325 (65 FE) MG tablet TAKE 1 TABLET BY MOUTH EVERY DAY WITH BREAKFAST 90 tablet 3   gabapentin (NEURONTIN) 300  MG capsule      insulin glargine (LANTUS) 100 UNIT/ML injection Inject 20-40 Units into the skin at bedtime as needed (BGL below 140-150 take 20 units, if 151 or higher take 40 units).     memantine (NAMENDA) 5 MG tablet Take 5 mg by mouth 2 (two) times daily.      metoprolol tartrate (LOPRESSOR) 25 MG tablet TAKE 1 TABLET BY MOUTH  DAILY (Patient taking differently: Take 12.5 mg by mouth 2 (two) times daily.) 90 tablet 3   mirabegron ER (MYRBETRIQ) 50 MG TB24 tablet Take 50 mg by mouth at bedtime.      omega-3 acid ethyl esters (LOVAZA) 1 G capsule Take 2 g by mouth daily.     prednisoLONE acetate (PRED FORTE) 1 % ophthalmic suspension Place 1 drop into the right eye 4 (four) times daily. 15 mL 0   rOPINIRole (REQUIP) 2 MG tablet Take 2 mg by mouth 3 (three) times daily  as needed (RLS).      spironolactone (ALDACTONE) 25 MG tablet TAKE ONE-HALF TABLET BY  MOUTH DAILY 45 tablet 3   tamsulosin (FLOMAX) 0.4 MG CAPS capsule Take 0.4 mg by mouth daily after supper.      valsartan-hydrochlorothiazide (DIOVAN-HCT) 80-12.5 MG tablet TAKE 1 TABLET BY MOUTH IN  THE MORNING 90 tablet 3   vitamin B-12 (CYANOCOBALAMIN) 1000 MCG tablet Take 1,000 mcg by mouth 2 (two) times daily.     XARELTO 15 MG TABS tablet TAKE 1 TABLET BY MOUTH IN  THE EVENING AFTER DINNER 90 tablet 3   No current facility-administered medications on file prior to visit.    Medications after present encounter:  Current Outpatient Medications  Medication Instructions   atorvastatin (LIPITOR) 80 MG tablet TAKE 1 TABLET BY MOUTH  DAILY   Bromfenac Sodium (PROLENSA) 0.07 % SOLN 1 drop, Right Eye, 4 times daily   colesevelam (WELCHOL) 625 mg, Oral, 3 times daily   donepezil (ARICEPT) 10 mg, Oral, Every morning   esomeprazole (NEXIUM) 40 mg, Oral, Daily   ferrous sulfate 325 (65 FE) MG tablet TAKE 1 TABLET BY MOUTH EVERY DAY WITH BREAKFAST   gabapentin (NEURONTIN) 300 MG capsule No dose, route, or frequency recorded.   insulin glargine (LANTUS) 20-40 Units, Subcutaneous, At bedtime PRN   memantine (NAMENDA) 5 mg, Oral, 2 times daily   metoprolol tartrate (LOPRESSOR) 25 MG tablet TAKE 1 TABLET BY MOUTH  DAILY   mirabegron ER (MYRBETRIQ) 50 mg, Oral, Daily at bedtime   nitroGLYCERIN (NITROSTAT) 0.4 mg, Sublingual, Every 5 min PRN   omega-3 acid ethyl esters (LOVAZA) 2 g, Oral, Daily,     prednisoLONE acetate (PRED FORTE) 1 % ophthalmic suspension 1 drop, Right Eye, 4 times daily   rOPINIRole (REQUIP) 2 mg, Oral, 3 times daily PRN   spironolactone (ALDACTONE) 25 MG tablet TAKE ONE-HALF TABLET BY  MOUTH DAILY   tamsulosin (FLOMAX) 0.4 mg, Oral, Daily after supper   torsemide (DEMADEX) 20 mg, Oral, As directed, 2 times a week on Tuesday and Thursday.  Take additional dose as directed with weight gain of 2  Lbs   valsartan-hydrochlorothiazide (DIOVAN-HCT) 80-12.5 MG tablet TAKE 1 TABLET BY MOUTH IN  THE MORNING   vitamin B-12 (CYANOCOBALAMIN) 1,000 mcg, Oral, 2 times daily   XARELTO 15 MG TABS tablet TAKE 1 TABLET BY MOUTH IN  THE EVENING AFTER DINNER    CT Chest 06/12/2020: 1. There is a rounded ground-glass opacity of the central left lower lobe measuring 2.8 x 2.5 cm  with a solid central nodule measuring 4 mm. This is concerning for adenocarcinoma. Comparison to prior examination dated 03/17/2018 is not possible due to the presence of pleural effusions and atelectasis at that time. Given small size, PET-CT is likely of limited utility to assess for metabolic activity. Recommend initial follow-up examination at 3-6 months to assess for persistence. If unchanged, and solid component remains <6 mm, annual CT is recommended until 5 years of stability has been established. If persistent these nodules should be considered highly suspicious if the solid component of the nodule is 6 mm or greater in size and enlarging. This recommendation follows the consensus statement: Guidelines for Management of Incidental Pulmonary Nodules Detected on CT Images  2. Scattered ground-glass opacity of the dependent left lung base, generally nonspecific and likely scarring and/or partial atelectasis. 3. Coronary artery disease. 4. Status post aortic valve replacement. 5. Aortic Atherosclerosis (ICD10-I70.0).  Cardiac Studies:   Carotid artery duplex 06/15/18: Right Carotid: Velocities in the right ICA are consistent with a 1-39%  stenosis.  Left Carotid: Velocities in the left ICA are consistent with a 1-39%  Stenosis.  Echocardiogram 07/11/2018: Left ventricle cavity is normal in size. Abnormal septal wall motion due to post-operative coronary artery bypass graft. Diastolic function assessment limited due to mitral annular calcification and post op status. Calculated EF 65%. Left atrial cavity is mildly  dilated. Well seated bioprosthetic aortic valve with normal functioning. Mean PG 8 mmHg likely normal for bioprosthetic valve. Moderate calcification of the mitral valve annulus and leaflets. No significant stenosis. Moderate (Grade II) mitral regurgitation. Compared to previous study on 08/24/2017, bioprosthetic aortic valve is new.   Coronary angiogram 03/20/2018: Left main mildly calcified but normal, LAD large caliber vessel, mild to moderate diffuse disease followed by occlusion in the mid to distal segment, distally the LAD appears to be diffusely diseased with TIMI I flow.  Aborted PTCA. D-2 is moderate to  large sized with tandem 80 to 90% stenosis. Mild disease in other vessels. Severe aortic stenosis with peak to peak gradient of 69 and mean gradient of 53 mmHg.  Severely calcified mitral annulus and moderately calcified aortic valve.  EKG:  EKG 03/03/2021: Atrial flutter with 3: 1 AV conduction, ventricular rate 61 bpm.  Left axis deviation, left anterior fascicular block.  Poor R progression, cannot exclude anteroseptal infarct old.  LVH.  Nonspecific T abnormality.  No significant change from 09/04/2020, 02/20/2020, 12/08/2019.  Assessment     ICD-10-CM   1. Atypical atrial flutter ([name])  I48.4 EKG 12-Lead    2. S/P aortic valve replacement with bioprosthetic valve  Z95.3 PCV ECHOCARDIOGRAM COMPLETE    3. Essential hypertension  I10     4. Chronic diastolic CHF (congestive heart failure) ([name])  U20.25 Basic metabolic panel    Brain natriuretic peptide    torsemide (DEMADEX) 20 MG tablet    PCV ECHOCARDIOGRAM COMPLETE    5. Coronary artery disease of native artery of native heart with stable angina pectoris ([name])  I25.118 nitroGLYCERIN (NITROSTAT) 0.4 MG SL tablet      CHA2DS2-VASc Score is 5.  Yearly risk of stroke: 7.2% (A, HTN, DM, Vasc Dz).  Score of 1=0.6; 2=2.2; 3=3.2; 4=4.8; 5=7.2; 6=9.8; 7=>9.8) -(CHF; HTN; vasc disease DM,  Male = 1; Age <65 =0; 65-74 = 1,  >75 =2;  stroke/embolism= 2).    Recommendations:   Cody Phelps  is a 80 y.o. Caucasian male h/o aortic valve replacement along with LIMA to D1 on 05/30/2018, invasive  prostate cancer diagnosed in Nov 2018 and is S/P chemo and RT and in remission, anemia of chronic disease and also mild iron defeciency, hypertension, hyperlipidemia, obstructive sleep apnea on CPAP, controlled diabetes mellitus, mild obesity, mostly truncal obesity. 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   He is also being followed by oncology Cody Blew, MD) for anemia and a new mass found on CT chest. 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  This is 67-monthoffice visit, he continues to remain in permanent atrial flutter.  He is had cardioversion both with and without amiodarone and hence amiodarone was discontinued on his visit in January 2021.  With regard to new onset dyspnea, suspect it is related to acute diastolic heart failure.  We will obtain a BNP today and also BMP.  Advised him to restart torsemide every day for the next 5 days followed by 2 days a week on Tuesday and Thursday.  They will also start monitoring his weight on a regular basis and call me if his weight increases by >2 to 3 pounds in 1 week.  Will repeat echocardiogram to follow-up on his aortic valve replacement and also dyspnea.  With regard to chest pain, appears very atypical however cannot exclude angina pectoris.  I prescribed him sublingual nitroglycerin to use.  I do not think he needs a stress test at this time, will continue to monitor his symptoms closely.  I would like to see him back in 6 weeks for close monitoring.  This was a 40-minute office visit encounter.   JAdrian Prows MD, F[facility]8/09/2020, 10:05 PM Office: [phone]

## 2021-03-04 LAB — BASIC METABOLIC PANEL
BUN/Creatinine Ratio: 14 (ref 10–24)
BUN: 17 mg/dL (ref 8–27)
CO2: 22 mmol/L (ref 20–29)
Calcium: 9.3 mg/dL (ref 8.6–10.2)
Chloride: 105 mmol/L (ref 96–106)
Creatinine, Ser: 1.18 mg/dL (ref 0.76–1.27)
Glucose: 106 mg/dL — ABNORMAL HIGH (ref 65–99)
Potassium: 4.5 mmol/L (ref 3.5–5.2)
Sodium: 141 mmol/L (ref 134–144)
eGFR: 63 mL/min/{1.73_m2} (ref >59)

## 2021-03-04 LAB — BRAIN NATRIURETIC PEPTIDE: BNP: 75.9 pg/mL (ref 0.0–100.0)

## 2021-03-10 ENCOUNTER — Ambulatory Visit (INDEPENDENT_AMBULATORY_CARE_PROVIDER_SITE_OTHER): Payer: Medicare Other | Admitting: Neurology

## 2021-03-10 ENCOUNTER — Encounter: Payer: Self-pay | Admitting: Neurology

## 2021-03-10 ENCOUNTER — Other Ambulatory Visit: Payer: Self-pay

## 2021-03-10 ENCOUNTER — Ambulatory Visit: Payer: Medicare Other

## 2021-03-10 VITALS — BP 126/72 | HR 63 | Ht 68.0 in | Wt 212.3 lb

## 2021-03-10 DIAGNOSIS — G2581 Restless legs syndrome: Secondary | ICD-10-CM

## 2021-03-10 DIAGNOSIS — I313 Pericardial effusion (noninflammatory): Secondary | ICD-10-CM

## 2021-03-10 DIAGNOSIS — M48062 Spinal stenosis, lumbar region with neurogenic claudication: Secondary | ICD-10-CM

## 2021-03-10 DIAGNOSIS — Z953 Presence of xenogenic heart valve: Secondary | ICD-10-CM | POA: Diagnosis not present

## 2021-03-10 DIAGNOSIS — I480 Paroxysmal atrial fibrillation: Secondary | ICD-10-CM

## 2021-03-10 DIAGNOSIS — I3139 Other pericardial effusion (noninflammatory): Secondary | ICD-10-CM

## 2021-03-10 DIAGNOSIS — G4733 Obstructive sleep apnea (adult) (pediatric): Secondary | ICD-10-CM

## 2021-03-10 DIAGNOSIS — I5032 Chronic diastolic (congestive) heart failure: Secondary | ICD-10-CM | POA: Diagnosis not present

## 2021-03-10 MED ORDER — MEMANTINE HCL 10 MG PO TABS: 10.0000 mg | ORAL_TABLET | Freq: Two times a day (BID) | ORAL | 3 refills | Status: DC

## 2021-03-10 NOTE — Patient Instructions (Addendum)
Memantine Tablets What is this medication? MEMANTINE (MEM an teen) is used to treat dementia caused by Alzheimer's disease. This medicine may be used for other purposes; ask your health care provider orpharmacist if you have questions. COMMON BRAND NAME(S): Namenda What should I tell my care team before I take this medication? They need to know if you have any of these conditions: difficulty passing urine kidney disease liver disease seizures an unusual or allergic reaction to memantine, other medicines, foods, dyes, or preservatives pregnant or trying to get pregnant breast-feeding How should I use this medication? Take this medicine by mouth with a glass of water. Follow the directions on the prescription label. You may take this medicine with or without food. Take your doses at regular intervals. Do not take your medicine more often than directed. Continue to take your medicine even if you feel better. Do not stop takingexcept on the advice of your doctor or health care professional. Talk to your pediatrician regarding the use of this medicine in children.Special care may be needed. Overdosage: If you think you have taken too much of this medicine contact apoison control center or emergency room at once. NOTE: This medicine is only for you. Do not share this medicine with others. What if I miss a dose? If you miss a dose, take it as soon as you can. If it is almost time for your next dose, take only that dose. Do not take double or extra doses. If you do not take your medicine for several days, contact your health care provider.Your dose may need to be changed. What may interact with this medication? acetazolamide amantadine cimetidine dextromethorphan dofetilide hydrochlorothiazide ketamine metformin methazolamide quinidine ranitidine sodium bicarbonate triamterene This list may not describe all possible interactions. Give your health care provider a list of all the medicines,  herbs, non-prescription drugs, or dietary supplements you use. Also tell them if you smoke, drink alcohol, or use illegaldrugs. Some items may interact with your medicine. What should I watch for while using this medication? Visit your doctor or health care professional for regular checks on your progress. Check with your doctor or health care professional if there is noimprovement in your symptoms or if they get worse. You may get drowsy or dizzy. Do not drive, use machinery, or do anything that needs mental alertness until you know how this drug affects you. Do not stand or sit up quickly, especially if you are an older patient. This reduces the risk of dizzy or fainting spells. Alcohol can make you more drowsy and dizzy.Avoid alcoholic drinks. What side effects may I notice from receiving this medication? Side effects that you should report to your doctor or health care professionalas soon as possible: allergic reactions like skin rash, itching or hives, swelling of the face, lips, or tongue agitation or a feeling of restlessness depressed mood dizziness hallucinations redness, blistering, peeling or loosening of the skin, including inside the mouth seizures vomiting Side effects that usually do not require medical attention (report to yourdoctor or health care professional if they continue or are bothersome): constipation diarrhea headache nausea trouble sleeping This list may not describe all possible side effects. Call your doctor for medical advice about side effects. You may report side effects to FDA at1-800-FDA-1088. Where should I keep my medication? Keep out of the reach of children. Store at room temperature between 15 degrees and 30 degrees C (59 degrees and86 degrees F). Throw away any unused medicine after the expiration date. NOTE: This sheet  is a summary. It may not cover all possible information. If you have questions about this medicine, talk to your doctor, pharmacist,  orhealth care provider.  2022 Elsevier/[employer] Standard (2013-05-06 14:10:42) [employer] (7th ed., pp. [phone]). [place], [place]: Elsevier."> [employer] (7th ed., pp. [phone]). [place], [place]: [employer]vier.">  Spinal Stenosis  Spinal stenosis is a condition that happens when the spinal canal narrows. The spinal canal is the space between the bones of your spine (vertebrae). This narrowing puts pressure on the spinal cord or nerves. Spinal stenosiscan affect the vertebrae in the neck, upper back, and lower back. This condition can range from mild to severe. In some cases, there are nosymptoms. What are the causes? This condition is caused by areas of bone pushing into the spinal canal. This condition may be present at birth (congenital), or it may be caused by: Slow breakdown of your vertebrae (spinal degeneration). This usually starts around 78 years of age. Injury (trauma) to your spine. Tumors in your spine. Calcium deposits in your spine. What increases the risk? The following factors may make you more likely to develop this condition: Being older than age 58. Having a problem present at birth with an abnormally shaped spine (congenitalspinal deformity), such as scoliosis. Having arthritis. What are the signs or symptoms? Symptoms of this condition include: Pain in the neck or back that is generally worse with activities, particularly when you stand or walk. Numbness, tingling, hot or cold sensations, weakness, or tiredness (fatigue) in your leg or legs. Pain going from the buttock, down the thigh, and to the calf (sciatica). This can happen in one or both legs. Frequent episodes of falling. A foot-slapping gait that leads to muscle weakness. In more severe cases, you may develop: Problems having a bowel movement or urinating. Difficulty having sex. Loss of feeling in your legs and inability to walk. Symptoms may come on slowly  and get worse over time. In some cases, there are no symptoms. How is this diagnosed? This condition is diagnosed based on your medical history and a physical exam.You will also have tests, such as an MRI, a CT scan, or an X-ray. How is this treated? Treatment for this condition often focuses on managing your pain and any other symptoms. Treatment may include: Practicing good posture to lessen pressure on your nerves. Exercising to strengthen muscles, build endurance, improve balance, and maintain range of motion. This may include physical therapy to restore movement and strength to your back. Losing weight, if needed. Medicines to reduce inflammation or pain. This may include a medicine that is injected into your spine (steroidinjection). Assistive devices, such as a corset or brace. In some cases, surgery may be needed. The most common procedure is decompression laminectomy. This is done to remove excess bone that putspressure on your nerve roots. Follow these instructions at home: Managing pain, stiffness, and swelling  Practice good posture. If you were given a brace or a corset, wear it as told by your health care provider. Maintain a healthy weight. Talk with your health care provider if you need help losing weight. If directed, apply heat to the affected area as often as told by your health care provider. Use the heat source that your health care provider recommends, such as a moist heat pack or a heating pad. Place a towel between your skin and the heat source. Leave the heat on for 20-30 minutes. Remove the heat if your skin turns bright red. This is  especially important if you are unable to feel pain, heat, or cold. You may have a greater risk of getting burned.  Activity Do all exercises and stretches as told by your health care provider. Do not do any activities that cause pain. Ask your health care provider what activities are safe for you. Do not lift anything that is heavier  than 10 lb (4.5 kg), or the limit that you are told by your health care provider. Return to your normal activities as told by your health care provider. Ask your health care provider what activities are safe for you. General instructions Take over-the-counter and prescription medicines only as told by your health care provider. Do not use any products that contain nicotine or tobacco, such as cigarettes, e-cigarettes, and chewing tobacco. If you need help quitting, ask your health care provider. Eat a healthy diet. This includes plenty of fruits and vegetables, whole grains, and low-fat (lean) protein. Keep all follow-up visits as told by your health care provider. This is important. Contact a health care provider if: Your symptoms do not get better or they get worse. You have a fever. Get help right away if: You have new pain or symptoms of severe pain, such as: New or worsening pain in your neck or upper back. Severe pain that cannot be controlled with medicines. A severe headache that gets worse when you stand. You are dizzy. You have vision problems, such as blurred vision or double vision. You have nausea or you vomit. You develop new or worsening numbness or tingling in your back or legs. You have pain, redness, swelling, or warmth in your arm or leg. Summary Spinal stenosis is a condition that happens when the spinal canal narrows. The spinal canal is the space between the bones of your spine (vertebrae). This narrowing puts pressure on the spinal cord or nerves. This condition may be caused by a birth defect, breakdown of your vertebrae, trauma, tumors, or calcium deposits. Spinal stenosis can cause numbness, weakness, or pain in the buttocks, neck, back, and legs. This condition is usually diagnosed with your medical history, a physical exam, and tests, such as an MRI, a CT scan, or an X-ray. This information is not intended to replace advice given to you by your health care  provider. Make sure you discuss any questions you have with your healthcare provider. Document Revised: 05/16/2019 Document Reviewed: 05/16/2019 Elsevier Patient Education  2022 [employer].
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
## 2021-03-17 ENCOUNTER — Other Ambulatory Visit: Payer: Self-pay

## 2021-03-17 ENCOUNTER — Ambulatory Visit: Payer: Medicare Other

## 2021-03-17 DIAGNOSIS — I5032 Chronic diastolic (congestive) heart failure: Secondary | ICD-10-CM

## 2021-03-17 DIAGNOSIS — Z953 Presence of xenogenic heart valve: Secondary | ICD-10-CM

## 2021-03-23 NOTE — Progress Notes (Signed)
[facility] Note  03/24/2021     CHIEF COMPLAINT Patient presents for Retina Follow Up   HISTORY OF PRESENT ILLNESS: Cody Phelps is a 80 y.o. male who presents to the clinic today for:  HPI     Retina Follow Up   Patient presents with  Other.  In both eyes.  This started weeks ago.  Severity is moderate.  Duration of weeks.  Since onset it is stable.  I, the attending physician,  performed the HPI with the patient and updated documentation appropriately.        Comments   Pt states vision is the same OU.  Pt denies eye pain or discomfort and denies any new or worsening floaters or fol OU.      Last edited by [name], MD on 03/30/2021 12:25 PM.    Pt recently saw Dr. Kathlen Mody, but did not get a new glasses, A1c was 6.3 yesterday  Referring physician: [name], MD [place],  [address] [address]  HISTORICAL INFORMATION:   Selected notes from the [facility] Referred by Dr. [name] for concern of RD OD   CURRENT MEDICATIONS: Current Outpatient Medications (Ophthalmic Drugs)  Medication Sig   Bromfenac Sodium (PROLENSA) 0.07 % SOLN Place 1 drop into the right eye 4 (four) times daily.   prednisoLONE acetate (PRED FORTE) 1 % ophthalmic suspension Place 1 drop into the right eye 4 (four) times daily.   No current facility-administered medications for this visit. (Ophthalmic Drugs)   Current Outpatient Medications (Other)  Medication Sig   atorvastatin (LIPITOR) 80 MG tablet TAKE 1 TABLET BY MOUTH  DAILY   colesevelam (WELCHOL) 625 MG tablet Take 625 mg by mouth 3 (three) times daily.    donepezil (ARICEPT) 10 MG tablet Take 10 mg by mouth every morning.    esomeprazole (NEXIUM) 40 MG capsule Take 40 mg by mouth daily.    ferrous sulfate 325 (65 FE) MG tablet TAKE 1 TABLET BY MOUTH EVERY DAY WITH BREAKFAST   gabapentin (NEURONTIN) 300 MG capsule    insulin glargine (LANTUS) 100 UNIT/ML injection Inject  20-40 Units into the skin at bedtime as needed (BGL below 140-150 take 20 units, if 151 or higher take 40 units).   memantine (NAMENDA) 10 MG tablet Take 1 tablet (10 mg total) by mouth 2 (two) times daily.   metoprolol tartrate (LOPRESSOR) 25 MG tablet TAKE 1 TABLET BY MOUTH  DAILY (Patient taking differently: Take 12.5 mg by mouth 2 (two) times daily.)   mirabegron ER (MYRBETRIQ) 50 MG TB24 tablet Take 50 mg by mouth at bedtime.    nitroGLYCERIN (NITROSTAT) 0.4 MG SL tablet Place 1 tablet (0.4 mg total) under the tongue every 5 (five) minutes as needed for up to 25 days for chest pain.   omega-3 acid ethyl esters (LOVAZA) 1 G capsule Take 2 g by mouth daily.   rOPINIRole (REQUIP) 2 MG tablet Take 2 mg by mouth 3 (three) times daily as needed (RLS).    spironolactone (ALDACTONE) 25 MG tablet TAKE ONE-HALF TABLET BY  MOUTH DAILY   tamsulosin (FLOMAX) 0.4 MG CAPS capsule Take 0.4 mg by mouth daily after supper.    torsemide (DEMADEX) 20 MG tablet Take 1 tablet (20 mg total) by mouth as directed. 2 times a week on Tuesday and Thursday.  Take additional dose as directed with weight gain of 2 Lbs   valsartan-hydrochlorothiazide (DIOVAN-HCT) 80-12.5 MG tablet TAKE 1 TABLET  BY MOUTH IN  THE MORNING   vitamin B-12 (CYANOCOBALAMIN) 1000 MCG tablet Take 1,000 mcg by mouth 2 (two) times daily.   XARELTO 15 MG TABS tablet TAKE 1 TABLET BY MOUTH IN  THE EVENING AFTER DINNER   No current facility-administered medications for this visit. (Other)      REVIEW OF SYSTEMS: ROS   Positive for: Musculoskeletal, Endocrine, Cardiovascular, Eyes, Respiratory Negative for: Constitutional, Gastrointestinal, Neurological, Skin, Genitourinary, HENT, Psychiatric, Allergic/Imm, Heme/Lymph Last edited by Doneen Poisson on 03/24/2021  1:33 PM.         ALLERGIES No Known Allergies  PAST MEDICAL HISTORY Past Medical History:  Diagnosis Date   Acute anterior wall MI ([place]) 03/01/2018   Acute combined systolic and  diastolic heart failure ([place]) 03/15/2018   Burn    Chronic diastolic CHF (congestive heart failure) ([place]) 09/21/2018   Defect, retina, with detachment    Dementia ([place])    Diverticulosis    ED (erectile dysfunction)    First degree heart block    GERD (gastroesophageal reflux disease)    Heart murmur    History of blood transfusion    with open heart surgery   Hypertension    cardiologsit-  dr [name]   Hypertensive retinopathy    OU   Iron deficiency anemia    Mixed hyperlipidemia    Morbid obesity ([place])    Myocardial infarction ([place])    02/28/18   OA (osteoarthritis)    right hip   OSA on CPAP    followed by dr dohmeier, uses CPAP   PAF (paroxysmal atrial fibrillation) ([place])    a. on Eliquis   Prostate cancer ([place])    dx 03-17-2017 (bx)  Stage T2a, Gleason 4+4, PSA  4.43, vol 32.32--  s/p radiatctive prostate seed implants 07-10-2017 then IMRT and ADT   Retinal detachment    Rheg. 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   LIMA to Diagonal Branch   S/P Maze operation for atrial fibrillation 05/30/2018   Complete bilateral atrial lesion set using bipolar radiofrequency and cryothermy ablation with clipping of LA appendage   Scoliosis    Trigger finger    Type 2 diabetes mellitus ([place])    Wears partial dentures    upper and lower   Past Surgical History:  Procedure Laterality Date   AORTIC VALVE REPLACEMENT N/A 05/30/2018   Procedure: AORTIC VALVE REPLACEMENT (AVR) using Inspiris Valve, Size 23;  Surgeon: [name], MD;  Location: [facility];  Service: Open Heart Surgery;  Laterality: N/A;   APPENDECTOMY  1988   BILATERAL TOTAL ETHMOIDECTOMY AND SPHENOIDECTOMY  01-14-2009    dr Redmond Baseman   [facility]   CARDIOVERSION N/A 08/10/2018   Procedure: CARDIOVERSION;  Surgeon: Adrian Prows, MD;  Location: [place];  Service:  Cardiovascular;  Laterality: N/A;   CARPAL TUNNEL RELEASE Bilateral 2013   CATARACT EXTRACTION Bilateral    Dr. [name]   CATARACT EXTRACTION W/ INTRAOCULAR LENS  IMPLANT, BILATERAL  date?   CORONARY ARTERY BYPASS GRAFT N/A 05/30/2018   CORONARY BALLOON ANGIOPLASTY N/A 03/01/2018   Procedure: CORONARY BALLOON ANGIOPLASTY;  Surgeon: Adrian Prows, MD;  Location: [place] [facility];  Service: Cardiovascular;  Laterality: N/A;   CORONARY/GRAFT ACUTE MI REVASCULARIZATION N/A 03/01/2018   Procedure: Coronary/Graft Acute MI Revascularization;  Surgeon: Adrian Prows, MD;  Location: [place] [facility];  Service: Cardiovascular;  Laterality: N/A;   CYSTOSCOPY  07/19/2017   Procedure: CYSTOSCOPY;  Surgeon: Nickie Retort, MD;  Location: [facility];  Service: Urology;;  No seeds found in [place] Bilateral    Cat Sx OU.  Dislocated IOL sx OD.  Rheg. RD repair OS.   EYE SURGERY     GAS/FLUID EXCHANGE Right 06/13/2019   Procedure: GAS/FLUID EXCHANGE;  Surgeon: [name], MD;  Location: [place];  Service: Ophthalmology;  Laterality: Right;   LAMINECTOMY WITH POSTERIOR LATERAL ARTHRODESIS LEVEL 2 N/A 02/12/2014   Procedure: LUMBAR TWO-THREE,LUIMBAR THREE-FOUR LAMINECTOMY/FORAMINOTOMY;POSSIBLE POSTEROLATERAL ARTHRODESIS WITH AUTOGRAFT;  Surgeon: [name], MD;  Location: [facility];  Service: Neurosurgery;  Laterality: N/A;   LEFT HEART CATH AND CORONARY ANGIOGRAPHY N/A 03/01/2018   Procedure: LEFT HEART CATH AND CORONARY ANGIOGRAPHY;  Surgeon: Adrian Prows, MD;  Location: [place] [facility];  Service: Cardiovascular;  Laterality: N/A;   MAZE N/A 05/30/2018   Procedure: MAZE;  Surgeon: [name], MD;  Location: [facility];  Service: Open Heart Surgery;  Laterality: N/A;   ORIF RIGHT ANKLE FX'S  05/05/2001   retained hardware   PARS PLANA VITRECTOMY Right 06/13/2019   Procedure: PARS PLANA VITRECTOMY WITH 25 GAUGE WITH LASER AND GAS;  Surgeon: [name], MD;  Location: [facility];  Service: Ophthalmology;  Laterality: Right;   PARS PLANA VITRECTOMY Right 09/12/2019   Procedure: Pars Plana Vitrectomy With 25 Gauge;  Surgeon: [name], MD;  Location: [place];  Service: Ophthalmology;  Laterality: Right;   PERFLUORONE INJECTION Right 06/13/2019   Procedure: PERFLUORONE INJECTION;  Surgeon: [name], MD;  Location: [facility];  Service: Ophthalmology;  Laterality: Right;   PHOTOCOAGULATION WITH LASER Right 06/13/2019   Procedure: PHOTOCOAGULATION WITH LASER;  Surgeon: [name], MD;  Location: [facility];  Service: Ophthalmology;  Laterality: Right;   PLACEMENT AND SUTURE OF SECONDARY INTRAOCULAR LENS Right 09/12/2019   Procedure: PLACEMENT AND SUTURE OF SECONDARY INTRAOCULAR LENS;  Surgeon: [name], MD;  Location: [facility];  Service: Ophthalmology;  Laterality: Right;   PROSTATE BIOPSY  03-17-2017   dr Pilar Jarvis office   RADIOACTIVE SEED IMPLANT N/A 07/19/2017   Procedure: RADIOACTIVE SEED IMPLANT/BRACHYTHERAPY IMPLANT;  Surgeon: Nickie Retort, MD;  [facility];  Service: Urology;  Laterality: N/A;  69 seeds implanted   REMOVAL RETAINED LENS Right 09/12/2019   Procedure: REMOVAL RETAINED LENS;  Surgeon: [name], MD;  Location: [facility];  Service: Ophthalmology;  Laterality: Right;   RETINAL DETACHMENT SURGERY Right 06/13/2019   Dr. Coralyn Pear   SKIN GRAFT     face   SPACE OAR INSTILLATION N/A 07/19/2017   Procedure: SPACE OAR INSTILLATION;  Surgeon: Nickie Retort, MD;  [facility];  Service: Urology;  Laterality: N/A;   TEE WITHOUT CARDIOVERSION  03/20/2012   Procedure: TRANSESOPHAGEAL ECHOCARDIOGRAM (TEE);  Surgeon: Laverda Page, MD;  [facility];  Service: Cardiovascular;  Laterality: N/A;  normal LV; normal EF; normal RV; normal LA w/ left atrial appendage very small, normal function, interatrial septum intact without defect; normal RA; trace MR,TR, & PI; mild AV calcification and senile  degeneration w/ mild stenosis, AVA 1.7cm^2;;    TEE WITHOUT CARDIOVERSION N/A 05/30/2018   Procedure: TRANSESOPHAGEAL ECHOCARDIOGRAM (TEE);  Surgeon: [name], MD;  Location: [place];  Service: Open Heart Surgery;  Laterality: N/A;   TOTAL HIP ARTHROPLASTY Right 10/23/2017  Procedure: TOTAL HIP ARTHROPLASTY ANTERIOR APPROACH;  Surgeon: Frederik Pear, MD;  Location: [place];  Service: Orthopedics;  Laterality: Right;   TRANSTHORACIC ECHOCARDIOGRAM  01-11-2017   dr [name]  (per echo note, no significant change in seveity of AS, no other diagnostic change)   moderate concentric LVH, ef 09%, grade 1 diastolic dysfunction/  moderate LAE/  mild , grade 1 AR w/ moderate AV calcification, mild to moderate restricted AV leaflets w/ moderate AS, AVA 1.16cm^2, peak grandient 70mHg, mean grandient 3770mg/  trace MR, mild calcification MV annulus , mild MV leaflet calcification, mild MVS, peak grandient 4.70m68m, mean grandient 2.7mm92m trace TR    FAMILY HISTORY Family History  Problem Relation Age of Onset   Stroke Mother 80  10ypertension Mother    Hypertension Father    Heart disease Father    Heart attack Father 70  43eart murmur Father    Hypertension Sister    Lung disease Brother    Colon cancer Neg Hx    Stomach cancer Neg Hx    Rectal cancer Neg Hx    Pancreatic cancer Neg Hx     SOCIAL HISTORY Social History   Tobacco Use   Smoking status: Former    Packs/day: 0.25    Years: 1.00    Pack years: 0.25    Types: Cigarettes    Quit date: 02/04/1967    Years since quitting: 54.1   Smokeless tobacco: Never   Tobacco comments:    smoked 1 q2-3 days  Vaping Use   Vaping Use: Never used  Substance Use Topics   Alcohol use: Yes    Comment: occasional beer   Drug use: No         OPHTHALMIC EXAM:  Base Eye Exam     Visual Acuity (Snellen - Linear)       Right Left   Dist [place] 20/40 +2 20/40 +2   Dist ph [place] 20/30 +2 20/20 -2         Tonometry (Tonopen, 1:40 PM)        Right Left   Pressure 18 14         Pupils       Dark Light Shape React APD   Right 4 3 Round Brisk 0   Left 4 3 Round Brisk 0         Visual Fields       Left Right    Full Full         Extraocular Movement       Right Left    Full Full         Neuro/Psych     Oriented x3: Yes   Mood/Affect: Normal         Dilation     Both eyes: 1.0% Mydriacyl, 2.5% Phenylephrine @ 1:40 PM           Slit Lamp and Fundus Exam     Slit Lamp Exam       Right Left   Lids/Lashes mild Meibomian gland dysfunction, mild Telangiectasia Dermatochalasis - upper lid, Meibomian gland dysfunction, Edema, Erythema lower lid, positive concretion left palp conj   Conjunctiva/Sclera White and quiet Temporal Pinguecula   Cornea well healed superior corneal wound, trace Punctate epithelial erosions, Arcus, mild tear film debris, fine endo pigment 1+ Punctate epithelial erosions, +verticellata, trace endo pigment, Debris in tear film   Anterior Chamber Deep, 0.5+ pigment Deep and quiet   Iris Round and dilated to 6mm,73m  scattered Transillumination defects 360, +iridodenesis, +pigment deposition Round and dilated   Lens Sutured Akreos IOL well centered  3 piece Posterior chamber intraocular in good position, trace Posterior capsular opacification   Vitreous post vitrectomy, trace pigment Vitreous syneresis, Posterior vitreous detachment         Fundus Exam       Right Left   Disc 1+Pallor, Sharp rim 2+Pallor, Sharp rim, temporal PPP, mild tilt   C/D Ratio 0.3 0.5   Macula Flat, Blunted foveal reflex, mild RPE mottling and clumping, persistent central cystic changes - slightly increased, +pigment deposition on retinal surface, scattered MA Flat, Blunted foveal reflex, Retinal pigment epithelial mottling and clumping, No heme or edema   Vessels Mild Vascular attenuation, mild Tortuousity, mild A/V crossing changes Vascular attenuation, mild Tortuous   Periphery attached, good 360 laser  in place; ORIGINALLY: bullous superior retinal detachment from 1000-0130, another more shallow detachment lobe from 130-400, lattice and micro tears ST quadrant; Old retinal tear at 0900 with surrounding laser Attached, peripheral laser scars at 1030, No new RT/RD           Refraction     Wearing Rx       Sphere Cylinder Axis Add   Right -1.25 +1.50 025 +2.75   Left -1.00 +1.75 180 +2.75            IMAGING AND PROCEDURES  Imaging and Procedures for _0 @  OCT, Retina - OU - Both Eyes       Right Eye Quality was good. Central Foveal Thickness: 306. Progression has worsened. Findings include normal foveal contour, no SRF, intraretinal fluid (Persistent cystic changes nasal fovea - slightly increased; trace ERM).   Left Eye Quality was good. Central Foveal Thickness: 270. Progression has been stable. Findings include normal foveal contour, no IRF, no SRF.   Notes *Images captured and stored on drive  Diagnosis / Impression:  OD: Persistent cystic changes nasal fovea-- slightly increased; trace ERM OS: NFP, no IRF/SRF   Clinical management:  See below  Abbreviations: NFP - Normal foveal profile. CME - cystoid macular edema. PED - pigment epithelial detachment. IRF - intraretinal fluid. SRF - subretinal fluid. EZ - ellipsoid zone. ERM - epiretinal membrane. ORA - outer retinal atrophy. ORT - outer retinal tubulation. SRHM - subretinal hyper-reflective material      Injection into Tenon's Capsule - OD - Right Eye       Time Out 03/24/2021. 2:58 PM. Confirmed correct patient, procedure, site, and patient consented.   Anesthesia Topical anesthesia was used. Anesthetic medications included Lidocaine 2%, Proparacaine 0.5%.   Procedure Preparation included 5% betadine to ocular surface. A 27 gauge needle was used.   Injection: 40 mg triamcinolone acetonide 40 MG/ML   Route: Other, Site: Right Eye   [place]: [phone], Lot: [id number], Expiration date: 12/30/2022    Post-op Post injection exam found visual acuity of at least counting fingers. The patient tolerated the procedure well. There were no complications. The patient received written and verbal post procedure care education. Post injection medications were not given.   Notes 1 cc of Kenalog-40 (40 mg) was injected into subtenon's capsule in the superotemporal quadrant. Betadine was applied to Injection area pre and post-injection then rinsed with sterile BSS. 1 drop of polymixin was instilled into the eye. There were no complications. Pt tolerated procedure well.            ASSESSMENT/PLAN:    ICD-10-CM   1. Dislocation of intraocular lens, sequela  T85.22XS  2. Aphakia of right eye  H27.01     3. Right retinal detachment  H33.21     4. 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 Retinal edema  H35.81 OCT, Retina - OU - Both Eyes    6. History of repair of retinal tear by laser photocoagulation  Z98.890     7. Diabetes mellitus type 2 without retinopathy ([place])  E11.9     8. Essential hypertension  I10     9. Hypertensive retinopathy of both eyes  H35.033     10. Pseudophakia of both eyes  Z96.1     11. Cystoid macular edema of right eye  H35.351 Injection into Tenon's Capsule - OD - Right Eye    triamcinolone acetonide (KENALOG-40) injection 40 mg     1,2. Dislocated IOL OD  - pt with history of partially dislocated 3-piece IOL -- spontaneously dislocated completely into vitreous cavity on 1.23.21  - s/p 25g PPV w/ IOL explantation and secondary sutured IOL OD -- Akreos AO60 lens, 17.5D -- 02.11.2021             - IOL in good position  - 2 superior nylon sutures removed at slit lamp 03.12.21 -- last nylon suture removed at slit lamp, 04.30.21  - corneal edema resolved; PEE improved             - s/p STK OD #1 (07.09.21), #2 (01.26.22) for CME  - BCVA 20/30 from 20/25  - OCT shows persistent IRF/CME -- slightly increased (see #11 below)  - IOP okay at 18  -  ATs QID OU              - f/u 4-6 wks --DFE/OCT   3-5. Rhegmatogenous retinal detachment, right eye  - bullous, superior, mac off detachment, onset of foveal involvement 11.11.20 by history  - Bi-lobed superior detachment, superior lobe spanning 1000-0130, nasal lobe 0130-0400  - lattice degeneration with microtears noted at 1030  - s/p PPV/PFC/EL/FAX/14% C3F8 OD, 11.12.20             - intraop: HST at 2 oclock was found; also detachment had progressed to subtotal detachment spanning 9 oclock to 6 oclock (going in clockwise direction); also severe zonular insufficiency with IOL very mobile throughout case -- did not dislocate completely during the case or immediately post op  - doing well              - retina attached and in good position--good laser in place  6. History of retinal defects s/p laser retinopexy OU with Dr. Zigmund Daniel in 2013  - OD laser at 0900  - OS laser at 1030  - stable  7. Diabetes mellitus, type 2 without retinopathy  - The incidence, risk factors for progression, natural history and treatment options for diabetic retinopathy  were discussed with patient.    - The need for close monitoring of blood glucose, blood pressure, and serum lipids, avoiding cigarette or any type of tobacco, and the need for long term follow up was also discussed with patient  8,9. Hypertensive retinopathy OU  - discussed importance of tight BP control  - monitor  10. Pseudophakia OU  - s/p CE/IOL OU (Dr. [name])  - 3 piece IOL OD was completely displaced into vitreous -- now with sutured Akreos IOL in good position OD -- see above  - OS 3-piece IOL in good position  - monitor  11. CME OD  - post op  edema  - s/p STK OD #1 07.09.21, #2 (01.26.22)  - today, 08.24.22, tr persistent IRF/CME -- mild cystic changes -- increased  - recommend STK OD #3 today, 08.24.22  - pt wishes to proceed with injection  - RBA of procedure discussed, questions answered - informed consent obtained and  signed - see procedure note  - cont PF QID OD and Prolensa QID OD  - f/u 6 weeks DFE, OCT   Ophthalmic Meds Ordered this visit:  Meds ordered this encounter  Medications   triamcinolone acetonide (KENALOG-40) injection 40 mg      Return in about 6 weeks (around 05/05/2021) for f/u CME OD, DFE, OCT.  There are no Patient Instructions on file for this visit.  This document serves as a record of services personally performed by Gardiner Sleeper, MD, PhD. It was created on their behalf by Orvan Falconer, an ophthalmic technician. The creation of this record is the provider's dictation and/or activities during the visit.    Electronically signed by: Orvan Falconer, OA, 03/30/21  12:33 PM   Gardiner Sleeper, M.D., Ph.D. Diseases & Surgery of the Retina and [place] 03/24/2021   I have reviewed the above documentation for accuracy and completeness, and I agree with the above. Gardiner Sleeper, M.D., Ph.D. 03/30/21 12:36 PM  Abbreviations: M myopia (nearsighted); A astigmatism; H hyperopia (farsighted); P presbyopia; Mrx spectacle prescription;  CTL contact lenses; OD right eye; OS left eye; OU both eyes  XT exotropia; ET esotropia; PEK punctate epithelial keratitis; PEE punctate epithelial erosions; DES dry eye syndrome; MGD meibomian gland dysfunction; ATs artificial tears; PFAT's preservative free artificial tears; [place] nuclear sclerotic cataract; PSC posterior subcapsular cataract; ERM epi-retinal membrane; PVD posterior vitreous detachment; RD retinal detachment; DM diabetes mellitus; DR diabetic retinopathy; NPDR non-proliferative diabetic retinopathy; PDR proliferative diabetic retinopathy; CSME clinically significant macular edema; DME diabetic macular edema; dbh dot blot hemorrhages; CWS cotton wool spot; POAG primary open angle glaucoma; C/D cup-to-disc ratio; HVF humphrey visual field; GVF goldmann visual field; OCT optical coherence tomography; IOP  intraocular pressure; BRVO Branch retinal vein occlusion; CRVO central retinal vein occlusion; CRAO central retinal artery occlusion; BRAO branch retinal artery occlusion; RT retinal tear; SB scleral buckle; PPV pars plana vitrectomy; VH Vitreous hemorrhage; PRP panretinal laser photocoagulation; IVK intravitreal kenalog; VMT vitreomacular traction; MH Macular hole;  NVD neovascularization of the disc; NVE neovascularization elsewhere; AREDS age related eye disease study; ARMD age related macular degeneration; POAG primary open angle glaucoma; EBMD epithelial/anterior basement membrane dystrophy; ACIOL anterior chamber intraocular lens; IOL intraocular lens; PCIOL posterior chamber intraocular lens; Phaco/IOL phacoemulsification with intraocular lens placement; [place] photorefractive keratectomy; LASIK laser assisted in situ keratomileusis; HTN hypertension; DM diabetes mellitus; COPD chronic obstructive pulmonary disease

## 2021-03-24 ENCOUNTER — Ambulatory Visit (INDEPENDENT_AMBULATORY_CARE_PROVIDER_SITE_OTHER): Payer: Medicare Other | Admitting: Ophthalmology

## 2021-03-24 ENCOUNTER — Other Ambulatory Visit: Payer: Self-pay

## 2021-03-24 DIAGNOSIS — I10 Essential (primary) hypertension: Secondary | ICD-10-CM

## 2021-03-24 DIAGNOSIS — H3321 Serous retinal detachment, right eye: Secondary | ICD-10-CM | POA: Diagnosis not present

## 2021-03-24 DIAGNOSIS — H35351 Cystoid macular degeneration, right eye: Secondary | ICD-10-CM | POA: Diagnosis not present

## 2021-03-24 DIAGNOSIS — H2701 Aphakia, right eye: Secondary | ICD-10-CM

## 2021-03-24 DIAGNOSIS — T8522XS Displacement of intraocular lens, sequela: Secondary | ICD-10-CM

## 2021-03-24 DIAGNOSIS — H35033 Hypertensive retinopathy, bilateral: Secondary | ICD-10-CM

## 2021-03-24 DIAGNOSIS — H3581 Retinal edema: Secondary | ICD-10-CM

## 2021-03-24 DIAGNOSIS — H35411 Lattice degeneration of retina, right eye: Secondary | ICD-10-CM

## 2021-03-24 DIAGNOSIS — Z9889 Other specified postprocedural states: Secondary | ICD-10-CM

## 2021-03-24 DIAGNOSIS — Z961 Presence of intraocular lens: Secondary | ICD-10-CM

## 2021-03-24 DIAGNOSIS — E119 Type 2 diabetes mellitus without complications: Secondary | ICD-10-CM

## 2021-03-30 ENCOUNTER — Encounter (INDEPENDENT_AMBULATORY_CARE_PROVIDER_SITE_OTHER): Payer: Self-pay | Admitting: Ophthalmology

## 2021-03-30 MED ORDER — TRIAMCINOLONE ACETONIDE 40 MG/ML IJ SUSP FOR KALEIDOSCOPE
40.0000 mg | INTRAMUSCULAR | Status: AC | PRN
  Administered 2021-03-24: 40 mg

## 2021-04-05 ENCOUNTER — Other Ambulatory Visit: Payer: Self-pay | Admitting: Cardiology

## 2021-04-12 ENCOUNTER — Ambulatory Visit (INDEPENDENT_AMBULATORY_CARE_PROVIDER_SITE_OTHER): Payer: Medicare Other | Admitting: Neurology

## 2021-04-12 DIAGNOSIS — G4731 Primary central sleep apnea: Secondary | ICD-10-CM

## 2021-04-12 DIAGNOSIS — I313 Pericardial effusion (noninflammatory): Secondary | ICD-10-CM

## 2021-04-12 DIAGNOSIS — G4733 Obstructive sleep apnea (adult) (pediatric): Secondary | ICD-10-CM

## 2021-04-12 DIAGNOSIS — G2581 Restless legs syndrome: Secondary | ICD-10-CM

## 2021-04-12 DIAGNOSIS — G4739 Other sleep apnea: Secondary | ICD-10-CM

## 2021-04-12 DIAGNOSIS — M48062 Spinal stenosis, lumbar region with neurogenic claudication: Secondary | ICD-10-CM

## 2021-04-12 DIAGNOSIS — I480 Paroxysmal atrial fibrillation: Secondary | ICD-10-CM

## 2021-04-12 DIAGNOSIS — I3139 Other pericardial effusion (noninflammatory): Secondary | ICD-10-CM

## 2021-04-12 DIAGNOSIS — Z953 Presence of xenogenic heart valve: Secondary | ICD-10-CM

## 2021-04-12 DIAGNOSIS — I5032 Chronic diastolic (congestive) heart failure: Secondary | ICD-10-CM

## 2021-04-23 ENCOUNTER — Telehealth: Payer: Self-pay | Admitting: Neurology

## 2021-04-23 NOTE — Telephone Encounter (Signed)
Wife states [facility] has yet to received the prescription for pt's  memantine (NAMENDA) 10 MG tablet, please send to pharmacy.

## 2021-04-26 ENCOUNTER — Other Ambulatory Visit: Payer: Self-pay | Admitting: *Deleted

## 2021-04-26 MED ORDER — MEMANTINE HCL 10 MG PO TABS: 10.0000 mg | ORAL_TABLET | Freq: Two times a day (BID) | ORAL | 1 refills | Status: DC

## 2021-04-26 NOTE — Telephone Encounter (Signed)
E-scribed rx to [facility].

## 2021-04-26 NOTE — Progress Notes (Signed)
[facility] TEST REPORT ( by Watch PAT)   STUDY DATA from 04-26-2021:   DOB:  [birth year]    ORDERING CLINICIAN: [name], MD  REFERRING CLINICIAN: Adrian Prows, MD    CLINICAL INFORMATION/HISTORY: Cody Phelps is a 80 - year old Caucasian male patient seen on 03/10/2021 , upon consultation request from Dr [name]. The patient had the first sleep study in the year 2015  with a result of an AHI ( Apnea Hypopnea index)  of 41.8/h and PLM arousals 10/h.  Nadir SpO2 was 87%.  The study was a split-night titration and the patient's second part of the night was dedicated to finding the best setting.  He ended up with a setting of 10 cmH2O pressure was 1 cm EPR.  Previously diagnosed as :Obstructive sleep apnea  S/P aortic valve replacement with bioprosthetic valve  Chronic diastolic CHF (congestive heart failure) ([name])  Pericardial effusion  Paroxysmal atrial fibrillation ([name])  RLS (restless legs syndrome)  Lumbar stenosis with neurogenic claudication. Dr Saintclair Halsted     Epworth sleepiness score: 17/24. FSS endorsed at 59/ 63 points, a hugh degree of fatigue.    BMI: 32 kg/m   Neck Circumference: 19"   FINDINGS:   Sleep Summary: The total recording time for this home sleep test amounted to 9 hours and 49 minutes of which 6 hours and 42 minutes with a calculated total sleep time with a 17% REM sleep proportion.                               Respiratory Indices:   Total AHI for this patient was 41.1/h during REM sleep there were only 12 events per hour and during non-REM sleep 46.3 events per hour.  This is an unusual distribution which indicates the possible presence of central sleep apnea.  The watch pat calculation was that 22% of total apneas were central in origin.  Positional AHI in supine sleep the AHI amounted to 47.3/h in prone sleep 5.5/h and there was no apnea in the right lateral sleep noted.  There were also only 12.5 minutes of sleep in  right lateral position seen.  The snoring level amounted to 41 dB at the mean volume.  The patient snored moderately loud for 25% of the total sleep time.                                                                   Oxygen Saturation Statistics:   O2 Saturation Range (%):    Oxygen saturation ranged between a nadir of 85% and a maximum of 99% with a mean oxygenation of 95%.  Total time in hypoxemia was 1.3 minutes at amounted to only 0.3% of total sleep.  It was therefore considered clinically irrelevant.                                   O2 Saturation (minutes) <89%: 1.3 min.          Pulse Rate Statistics:         Pulse Range:   Varied between a  maximum of 77 bpm and a minimum at 30 bpm.  The mean heart rate was 57 bpm.  Please note that this home sleep test device cannot give additional information about cardiac rhythm only the cardiac rate is recorded.              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  Since the patient is an experienced CPAP user, I will not require him to return for in -lab titration, in spite of the complex apnea diagnosis.      RECOMMENDATION: Since the patient is an experienced CPAP user, I will reorder an autotitration capable device for him. A pressure range of 7-15 cm water with 3 cm EPR will be provided and a nasal mask of his choice, all with heated humidification.  Our NP will follow once the patient is established on this new CPAP machine.    INTERPRETING PHYSICIAN:   [name], MD   Medical Director of [facility] at [facility].

## 2021-04-28 ENCOUNTER — Encounter: Payer: Self-pay | Admitting: Cardiology

## 2021-04-28 ENCOUNTER — Other Ambulatory Visit: Payer: Self-pay

## 2021-04-28 ENCOUNTER — Ambulatory Visit: Payer: Medicare Other | Admitting: Cardiology

## 2021-04-28 VITALS — BP 119/57 | HR 62 | Temp 97.9°F | Resp 17 | Ht 68.0 in | Wt 210.2 lb

## 2021-04-28 DIAGNOSIS — I25118 Atherosclerotic heart disease of native coronary artery with other forms of angina pectoris: Secondary | ICD-10-CM

## 2021-04-28 DIAGNOSIS — R0609 Other forms of dyspnea: Secondary | ICD-10-CM

## 2021-04-28 DIAGNOSIS — I10 Essential (primary) hypertension: Secondary | ICD-10-CM

## 2021-04-28 DIAGNOSIS — Z953 Presence of xenogenic heart valve: Secondary | ICD-10-CM

## 2021-04-28 DIAGNOSIS — R0600 Dyspnea, unspecified: Secondary | ICD-10-CM

## 2021-04-28 NOTE — Progress Notes (Signed)
Primary Physician/Referring:  [name], MD  Patient ID: Cody Phelps, male    DOB: [birth year], 80 y.o.   MRN: [id number]  Chief Complaint  Patient presents with   Congestive Heart Failure   Coronary Artery Disease   Chest Pain   Follow-up    6 weeks    HPI:    HPI: Cody Phelps  is a 80 y.o. Caucasian male h/o aortic valve replacement along with LIMA to D1 on 05/30/2018, invasive prostate cancer diagnosed in Nov 2018 and is S/P chemo and RT and in remission, anemia of chronic disease and also mild iron defeciency, hypertension, hyperlipidemia, obstructive sleep apnea on CPAP, permanent atrial fibrillation/atypical a flutter, controlled diabetes mellitus, mild obesity, mostly truncal obesity. 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  I had seen him 6 weeks ago, for worsening dyspnea and re-started him on torsemide daily and then 2 days a week as needed.  His dyspnea is now significantly improved.   No chest pain, no dizziness or syncope.  Tolerating anticoagulation without bleeding diathesis.  Accompanied by his wife.  Past Medical History:  Diagnosis Date   Acute anterior wall MI ([place]) 03/01/2018   Acute combined systolic and diastolic heart failure ([place]) 03/15/2018   Burn    Chronic diastolic CHF (congestive heart failure) ([place]) 09/21/2018   Defect, retina, with detachment    Dementia ([place])    Diverticulosis    ED (erectile dysfunction)    First degree heart block    GERD (gastroesophageal reflux disease)    Heart murmur    History of blood transfusion    with open heart surgery   Hypertension    cardiologsit-  dr [name]   Hypertensive retinopathy    OU   Iron deficiency anemia    Mixed hyperlipidemia    Morbid obesity ([place])    Myocardial infarction ([place])    02/28/18   OA (osteoarthritis)    right hip   OSA on CPAP    followed by dr  dohmeier, uses CPAP   PAF (paroxysmal atrial fibrillation) ([place])    a. on Eliquis   Prostate cancer ([place])    dx 03-17-2017 (bx)  Stage T2a, Gleason 4+4, PSA  4.43, vol 32.32--  s/p radiatctive prostate seed implants 07-10-2017 then IMRT and ADT   Retinal detachment    Rheg. 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   LIMA to Diagonal Branch   S/P Maze operation for atrial fibrillation 05/30/2018   Complete bilateral atrial lesion set using bipolar radiofrequency and cryothermy ablation with clipping of LA appendage   Scoliosis    Trigger finger    Type 2 diabetes mellitus ([place])    Wears partial dentures    upper and lower   Past Surgical History:  Procedure Laterality Date   AORTIC VALVE REPLACEMENT N/A 05/30/2018   Procedure: AORTIC VALVE REPLACEMENT (AVR) using Inspiris Valve, Size 23;  Surgeon: [name], MD;  Location: [place];  Service: Open Heart Surgery;  Laterality: N/A;   APPENDECTOMY  1988   BILATERAL TOTAL ETHMOIDECTOMY AND SPHENOIDECTOMY  01-14-2009    dr Redmond Baseman   [facility]   CARDIOVERSION N/A 08/10/2018   Procedure: CARDIOVERSION;  Surgeon: [name],  Ulice Dash, MD;  Location: [facility];  Service: Cardiovascular;  Laterality: N/A;   CARPAL TUNNEL RELEASE Bilateral 2013   CATARACT EXTRACTION Bilateral    Dr. [name]   CATARACT EXTRACTION W/ INTRAOCULAR LENS  IMPLANT, BILATERAL  date?   CORONARY ARTERY BYPASS GRAFT N/A 05/30/2018   CORONARY BALLOON ANGIOPLASTY N/A 03/01/2018   Procedure: CORONARY BALLOON ANGIOPLASTY;  Surgeon: Adrian Prows, MD;  Location: [place] CV LAB;  Service: Cardiovascular;  Laterality: N/A;   CORONARY/GRAFT ACUTE MI REVASCULARIZATION N/A 03/01/2018   Procedure: Coronary/Graft Acute MI Revascularization;  Surgeon: Adrian Prows, MD;  Location: [place] CV LAB;  Service: Cardiovascular;  Laterality:  N/A;   CYSTOSCOPY  07/19/2017   Procedure: CYSTOSCOPY;  Surgeon: Nickie Retort, MD;  Location: [facility];  Service: Urology;;  No seeds found in Mariano Colon Bilateral    Cat Sx OU.  Dislocated IOL sx OD.  Rheg. RD repair OS.   EYE SURGERY     GAS/FLUID EXCHANGE Right 06/13/2019   Procedure: GAS/FLUID EXCHANGE;  Surgeon: [name], MD;  Location: [place];  Service: Ophthalmology;  Laterality: Right;   LAMINECTOMY WITH POSTERIOR LATERAL ARTHRODESIS LEVEL 2 N/A 02/12/2014   Procedure: LUMBAR TWO-THREE,LUIMBAR THREE-FOUR LAMINECTOMY/FORAMINOTOMY;POSSIBLE POSTEROLATERAL ARTHRODESIS WITH AUTOGRAFT;  Surgeon: [name], MD;  Location: [facility];  Service: Neurosurgery;  Laterality: N/A;   LEFT HEART CATH AND CORONARY ANGIOGRAPHY N/A 03/01/2018   Procedure: LEFT HEART CATH AND CORONARY ANGIOGRAPHY;  Surgeon: Adrian Prows, MD;  Location: [place] CV LAB;  Service: Cardiovascular;  Laterality: N/A;   MAZE N/A 05/30/2018   Procedure: MAZE;  Surgeon: [name], MD;  Location: [place];  Service: Open Heart Surgery;  Laterality: N/A;   ORIF RIGHT ANKLE FX'S  05/05/2001   retained hardware   PARS PLANA VITRECTOMY Right 06/13/2019   Procedure: PARS PLANA VITRECTOMY WITH 25 GAUGE WITH LASER AND GAS;  Surgeon: [name], MD;  Location: [place];  Service: Ophthalmology;  Laterality: Right;   PARS PLANA VITRECTOMY Right 09/12/2019   Procedure: Pars Plana Vitrectomy With 25 Gauge;  Surgeon: [name], MD;  Location: [place];  Service: Ophthalmology;  Laterality: Right;   PERFLUORONE INJECTION Right 06/13/2019   Procedure: PERFLUORONE INJECTION;  Surgeon: [name], MD;  Location: [place];  Service: Ophthalmology;  Laterality: Right;   PHOTOCOAGULATION WITH LASER Right 06/13/2019   Procedure: PHOTOCOAGULATION WITH LASER;  Surgeon: [name], MD;  Location: [name];  Service: Ophthalmology;  Laterality: Right;   PLACEMENT AND SUTURE OF SECONDARY INTRAOCULAR LENS  Right 09/12/2019   Procedure: PLACEMENT AND SUTURE OF SECONDARY INTRAOCULAR LENS;  Surgeon: [name], MD;  Location: [place];  Service: Ophthalmology;  Laterality: Right;   PROSTATE BIOPSY  03-17-2017   dr Pilar Jarvis office   RADIOACTIVE SEED IMPLANT N/A 07/19/2017   Procedure: RADIOACTIVE SEED IMPLANT/BRACHYTHERAPY IMPLANT;  Surgeon: Nickie Retort, MD;  Location: [facility];  Service: Urology;  Laterality: N/A;  69 seeds implanted   REMOVAL RETAINED LENS Right 09/12/2019   Procedure: REMOVAL RETAINED LENS;  Surgeon: [name], MD;  Location: [place];  Service: Ophthalmology;  Laterality: Right;   RETINAL DETACHMENT SURGERY Right 06/13/2019   Dr. Coralyn Pear   SKIN GRAFT     face   SPACE OAR INSTILLATION N/A 07/19/2017   Procedure: SPACE OAR INSTILLATION;  Surgeon: Nickie Retort, MD;  Location[facility];  Service: Urology;  Laterality: N/A;   TEE WITHOUT CARDIOVERSION  03/20/2012  Procedure: TRANSESOPHAGEAL ECHOCARDIOGRAM (TEE);  Surgeon: Laverda Page, MD;  Location: [facility] ENDOSCOPY;  Service: Cardiovascular;  Laterality: N/A;  normal LV; normal EF; normal RV; normal LA w/ left atrial appendage very small, normal function, interatrial septum intact without defect; normal RA; trace MR,TR, & PI; mild AV calcification and senile degeneration w/ mild stenosis, AVA 1.7cm^2;;    TEE WITHOUT CARDIOVERSION N/A 05/30/2018   Procedure: TRANSESOPHAGEAL ECHOCARDIOGRAM (TEE);  Surgeon: [name], MD;  Location: [place];  Service: Open Heart Surgery;  Laterality: N/A;   TOTAL HIP ARTHROPLASTY Right 10/23/2017   Procedure: TOTAL HIP ARTHROPLASTY ANTERIOR APPROACH;  Surgeon: Frederik Pear, MD;  Location: [place];  Service: Orthopedics;  Laterality: Right;   TRANSTHORACIC ECHOCARDIOGRAM  01-11-2017   dr [name]  (per echo note, no significant change in seveity of AS, no other diagnostic change)   moderate concentric LVH, ef 59%, grade 1 diastolic dysfunction/  moderate  LAE/  mild , grade 1 AR w/ moderate AV calcification, mild to moderate restricted AV leaflets w/ moderate AS, AVA 1.16cm^2, peak grandient 63mHg, mean grandient 33mg/  trace MR, mild calcification MV annulus , mild MV leaflet calcification, mild MVS, peak grandient 4.24m83m, mean grandient 2.7mm54m trace TR   Social History   Tobacco Use   Smoking status: Former    Packs/day: 0.25    Years: 1.00    Pack years: 0.25    Types: Cigarettes    Quit date: 02/04/1967    Years since quitting: 54.2   Smokeless tobacco: Never   Tobacco comments:    smoked 1 q2-3 days  Substance Use Topics   Alcohol use: Yes    Comment: occasional beer   Marital Status: Married   ROS  Review of Systems  Cardiovascular:  Positive for dyspnea on exertion. Negative for chest pain, leg swelling and orthopnea.  Musculoskeletal:  Positive for arthritis and back pain.  Gastrointestinal:  Negative for melena.  Neurological:  Positive for disturbances in coordination and dizziness.  Objective  There were no vitals taken for this visit. There is no height or weight on file to calculate BMI.   Vitals with BMI 03/10/2021 03/03/2021 12/31/2020  Height _0  _1  -  Weight 212 lbs 5 oz 212 lbs 6 oz 214 lbs 10 oz  BMI [id number] 126 793 903 009astolic 72 65 63  Pulse 63 61 63    Physical Exam Constitutional:      General: He is not in acute distress.    Appearance: He is well-developed. He is obese.  Neck:     Thyroid: No thyromegaly.     Vascular: No JVD.  Cardiovascular:     Rate and Rhythm: Normal rate and regular rhythm.     Pulses: Intact distal pulses.          Carotid pulses are 2+ on the right side with bruit and 2+ on the left side with bruit.      Popliteal pulses are 2+ on the right side and 2+ on the left side.       Dorsalis pedis pulses are 2+ on the right side and 1+ on the left side.       Posterior tibial pulses are 0 on the right side and 0 on the left side.     Heart sounds: Murmur  heard.  Early systolic murmur is present with a grade of 2/6 at the upper right sternal border radiating to the apex.    No  gallop.     Comments: Leg edema pitting trace+ bilateral below knee.  Pulmonary:     Effort: Pulmonary effort is normal.     Breath sounds: Normal breath sounds.  Abdominal:     General: Bowel sounds are normal.     Palpations: Abdomen is soft.  Musculoskeletal:        General: Normal range of motion.     Right lower leg: No edema.     Left lower leg: No edema.  Neurological:     General: No focal deficit present.   Radiology: No results found.  Laboratory examination:   CMP Latest Ref Rng & Units 03/03/2021 09/15/2020 09/12/2019  Glucose 65 - 99 mg/dL 106(H) 94 134(H)  BUN 8 - 27 mg/dL _0 Creatinine 0.76 - 1.27 mg/dL 1.18 1.19 1.38(H)  Sodium 134 - 144 mmol/L 141 141 137  Potassium 3.5 - 5.2 mmol/L 4.5 4.3 3.7  Chloride 96 - 106 mmol/L 105 103 106  CO2 20 - 29 mmol/L _1 Calcium 8.6 - 10.2 mg/dL 9.3 9.4 9.0  Total Protein 6.0 - 8.5 g/dL - 7.0 -  Total Bilirubin 0.0 - 1.2 mg/dL - 0.4 -  Alkaline Phos 44 - 121 IU/L - 17(L) -  AST 0 - 40 IU/L - 18 -  ALT 0 - 44 IU/L - 21 -   CBC Latest Ref Rng & Units 12/31/2020 09/15/2020 07/02/2020  WBC 4.0 - 10.5 K/uL 6.2 6.3 5.3  Hemoglobin 13.0 - 17.0 g/dL 9.5(L) 10.6(L) 9.5(L)  Hematocrit 39.0 - 52.0 % 29.2(L) 32.7(L) 29.2(L)  Platelets 150 - 400 K/uL 171 191 153    Lipid Panel Recent Labs    09/15/20 1154  CHOL 142  TRIG 101  LDLCALC 70  HDL 53    Lipid Panel     Component Value Date/Time   CHOL 142 09/15/2020 1154   TRIG 101 09/15/2020 1154   HDL 53 09/15/2020 1154   CHOLHDL 2.7 03/01/2018 0514   VLDL 14 03/01/2018 0514   LDLCALC 70 09/15/2020 1154   HEMOGLOBIN A1C Lab Results  Component Value Date   HGBA1C 5.8 (H) 05/28/2018   MPG 119.76 05/28/2018   BNP (last 3 results) Recent Labs    09/15/20 1154 03/03/21 1605  BNP 86.4 75.9    ProBNP (last 3 results) No results for  input(s): PROBNP in the last 8760 hours.  External labs:  Cholesterol, total 143.000 m 01/07/2020 HDL 45 MG/DL 01/07/2020 LDL 70.000 mg 01/07/2020 Triglycerides 142.000 01/07/2020  A1C 6.000 % 01/07/2020 TSH 1.000 01/07/2020  Hemoglobin 10.000 g/d 01/07/2020 Platelets 176.000 01/01/2020   Creatinine, Serum 1.400 mg/ 01/07/2020 Potassium 3.700 09/12/2019 Magnesium N/D ALT (SGPT) 18.000 uni 01/07/2020  Labs 01/02/2019: A1c 5.8%.  Serum glucose 98 mg, BUN 16, creatinine 1.0, eGFR 72 mL, potassium 3.6, CMP otherwise normal.   HB 8.3/HCT 25.9, platelets 182, normal indicis.   Total cholesterol 98, triglycerides 57, HDL 42, LDL 45.  TSH normal.  Medications   Current Outpatient Medications on File Prior to Visit  Medication Sig Dispense Refill   amiodarone (PACERONE) 200 MG tablet Take 200 mg by mouth daily.     atorvastatin (LIPITOR) 80 MG tablet TAKE 1 TABLET BY MOUTH  DAILY 90 tablet 3   Bromfenac Sodium (PROLENSA) 0.07 % SOLN Place 1 drop into the right eye 4 (four) times daily. 3 mL 3   colesevelam (WELCHOL) 625 MG tablet Take 625 mg by mouth 3 (three) times daily.  donepezil (ARICEPT) 10 MG tablet Take 10 mg by mouth every morning.      esomeprazole (NEXIUM) 40 MG capsule Take 40 mg by mouth daily.      ferrous sulfate 325 (65 FE) MG tablet TAKE 1 TABLET BY MOUTH EVERY DAY WITH BREAKFAST 90 tablet 3   gabapentin (NEURONTIN) 300 MG capsule      insulin glargine (LANTUS) 100 UNIT/ML injection Inject 20-40 Units into the skin at bedtime as needed (BGL below 140-150 take 20 units, if 151 or higher take 40 units).     memantine (NAMENDA) 10 MG tablet Take 1 tablet (10 mg total) by mouth 2 (two) times daily. 180 tablet 1   metoprolol tartrate (LOPRESSOR) 25 MG tablet TAKE 1 TABLET BY MOUTH  DAILY (Patient taking differently: Take 12.5 mg by mouth 2 (two) times daily.) 90 tablet 3   mirabegron ER (MYRBETRIQ) 50 MG TB24 tablet Take 50 mg by mouth at bedtime.      nitroGLYCERIN (NITROSTAT) 0.4 MG SL  tablet Place 1 tablet (0.4 mg total) under the tongue every 5 (five) minutes as needed for up to 25 days for chest pain. 25 tablet 3   omega-3 acid ethyl esters (LOVAZA) 1 G capsule Take 2 g by mouth daily.     prednisoLONE acetate (PRED FORTE) 1 % ophthalmic suspension Place 1 drop into the right eye 4 (four) times daily. 15 mL 0   rOPINIRole (REQUIP) 2 MG tablet Take 2 mg by mouth 3 (three) times daily as needed (RLS).      spironolactone (ALDACTONE) 25 MG tablet TAKE ONE-HALF TABLET BY  MOUTH DAILY 45 tablet 3   tamsulosin (FLOMAX) 0.4 MG CAPS capsule Take 0.4 mg by mouth daily after supper.      torsemide (DEMADEX) 20 MG tablet Take 1 tablet (20 mg total) by mouth as directed. 2 times a week on Tuesday and Thursday.  Take additional dose as directed with weight gain of 2 Lbs 90 tablet 1   valsartan-hydrochlorothiazide (DIOVAN-HCT) 80-12.5 MG tablet TAKE 1 TABLET BY MOUTH IN  THE MORNING 90 tablet 3   vitamin B-12 (CYANOCOBALAMIN) 1000 MCG tablet Take 1,000 mcg by mouth 2 (two) times daily.     XARELTO 15 MG TABS tablet TAKE 1 TABLET BY MOUTH IN  THE EVENING AFTER DINNER 90 tablet 3   No current facility-administered medications on file prior to visit.    Medications after present encounter:  Current Outpatient Medications  Medication Instructions   amiodarone (PACERONE) 200 mg, Oral, Daily   atorvastatin (LIPITOR) 80 MG tablet TAKE 1 TABLET BY MOUTH  DAILY   Bromfenac Sodium (PROLENSA) 0.07 % SOLN 1 drop, Right Eye, 4 times daily   colesevelam (WELCHOL) 625 mg, Oral, 3 times daily   donepezil (ARICEPT) 10 mg, Oral, Every morning   esomeprazole (NEXIUM) 40 mg, Oral, Daily   ferrous sulfate 325 (65 FE) MG tablet TAKE 1 TABLET BY MOUTH EVERY DAY WITH BREAKFAST   gabapentin (NEURONTIN) 300 MG capsule No dose, route, or frequency recorded.   insulin glargine (LANTUS) 20-40 Units, Subcutaneous, At bedtime PRN   memantine (NAMENDA) 10 mg, Oral, 2 times daily   metoprolol tartrate (LOPRESSOR)  25 MG tablet TAKE 1 TABLET BY MOUTH  DAILY   mirabegron ER (MYRBETRIQ) 50 mg, Oral, Daily at bedtime   nitroGLYCERIN (NITROSTAT) 0.4 mg, Sublingual, Every 5 min PRN   omega-3 acid ethyl esters (LOVAZA) 2 g, Oral, Daily,     prednisoLONE acetate (PRED FORTE) 1 %  ophthalmic suspension 1 drop, Right Eye, 4 times daily   rOPINIRole (REQUIP) 2 mg, Oral, 3 times daily PRN   spironolactone (ALDACTONE) 25 MG tablet TAKE ONE-HALF TABLET BY  MOUTH DAILY   tamsulosin (FLOMAX) 0.4 mg, Oral, Daily after supper   torsemide (DEMADEX) 20 mg, Oral, As directed, 2 times a week on Tuesday and Thursday.  Take additional dose as directed with weight gain of 2 Lbs   valsartan-hydrochlorothiazide (DIOVAN-HCT) 80-12.5 MG tablet TAKE 1 TABLET BY MOUTH IN  THE MORNING   vitamin B-12 (CYANOCOBALAMIN) 1,000 mcg, Oral, 2 times daily   XARELTO 15 MG TABS tablet TAKE 1 TABLET BY MOUTH IN  THE EVENING AFTER DINNER    CT Chest 06/12/2020: 1. There is a rounded ground-glass opacity of the central left lower lobe measuring 2.8 x 2.5 cm with a solid central nodule measuring 4 mm. This is concerning for adenocarcinoma. Comparison to prior examination dated 03/17/2018 is not possible due to the presence of pleural effusions and atelectasis at that time. Given small size, PET-CT is likely of limited utility to assess for metabolic activity. Recommend initial follow-up examination at 3-6 months to assess for persistence. If unchanged, and solid component remains <6 mm, annual CT is recommended until 5 years of stability has been established. If persistent these nodules should be considered highly suspicious if the solid component of the nodule is 6 mm or greater in size and enlarging. This recommendation follows the consensus statement: Guidelines for Management of Incidental Pulmonary Nodules Detected on CT Images  2. Scattered ground-glass opacity of the dependent left lung base, generally nonspecific and likely scarring and/or  partial atelectasis. 3. Coronary artery disease. 4. Status post aortic valve replacement. 5. Aortic Atherosclerosis (ICD10-I70.0).  Cardiac Studies:   Carotid artery duplex 2018-06-18: Right Carotid: Velocities in the right ICA are consistent with a 1-39%  stenosis.  Left Carotid: Velocities in the left ICA are consistent with a 1-39%  Stenosis.  Coronary angiogram 03/20/2018: Left main mildly calcified but normal, LAD large caliber vessel, mild to moderate diffuse disease followed by occlusion in the mid to distal segment, distally the LAD appears to be diffusely diseased with TIMI I flow.  Aborted PTCA. D-2 is moderate to  large sized with tandem 80 to 90% stenosis. Mild disease in other vessels. Severe aortic stenosis with peak to peak gradient of 69 and mean gradient of 53 mmHg.  Severely calcified mitral annulus and moderately calcified aortic valve.  PCV ECHOCARDIOGRAM COMPLETE 03/17/2021  Narrative Echocardiogram 03/17/2021: Normal LV systolic function with EF 60%. Left ventricle cavity is normal in size. Normal global wall motion. Doppler evidence of grade II (pseudonormal) diastolic dysfunction, elevated LAP. Calculated EF 60%.  Ablation of the diastolic function couldn't be an error due to presence of mitral annular calcification. Left atrial cavity is moderately dilated at 4.8 cm. Bioprosthetic aortic valve. No evidence of aortic stenosis. No aortic valve regurgitation. No evidence of mitral stenosis. Mild (Grade I) mitral regurgitation. Moderate mitral valve leaflet calcification. Structurally normal tricuspid valve. No evidence of tricuspid stenosis. Mild tricuspid regurgitation. No evidence of pulmonary hypertension. Compared to the study done on 07/11/2018, moderate mitral regurgitation not present.     EKG:  EKG 03/03/2021: Atrial flutter with 3: 1 AV conduction, ventricular rate 61 bpm.  Left axis deviation, left anterior fascicular block.  Poor R progression, cannot  exclude anteroseptal infarct old.  LVH.  Nonspecific T abnormality.  No significant change from 09/04/2020, 02/20/2020, 12/08/2019.  Assessment  ICD-10-CM   1. Essential hypertension  I10     2. Dyspnea on exertion  R06.00     3. S/P aortic valve replacement with bioprosthetic valve  Z95.3     4. Coronary artery disease of native artery of native heart with stable angina pectoris ([place])  I25.118       CHA2DS2-VASc Score is 5.  Yearly risk of stroke: 7.2% (A, HTN, DM, Vasc Dz).  Score of 1=0.6; 2=2.2; 3=3.2; 4=4.8; 5=7.2; 6=9.8; 7=>9.8) -(CHF; HTN; vasc disease DM,  Male = 1; Age <65 =0; 65-74 = 1,  >75 =2; stroke/embolism= 2).    Recommendations:   Cody Phelps  is a 80 y.o. Caucasian male h/o aortic valve replacement along with LIMA to D1 on 05/30/2018, invasive prostate cancer diagnosed in Nov 2018 and is S/P chemo and RT and in remission, anemia of chronic disease and also mild iron defeciency, hypertension, hyperlipidemia, obstructive sleep apnea on CPAP, permanent atrial fibrillation/atypical a flutter, controlled diabetes mellitus, mild obesity, mostly truncal obesity. 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  I had seen him 6 weeks ago, for worsening dyspnea and re-started him on torsemide daily and then 2 days a week as needed.  His dyspnea is now significantly improved and there is resolution of leg edema.  I reviewed his echocardiogram, LVEF preserved, mitral regurgitation actually is improved from moderate to mild.  Suspect his dyspnea is related to acute diastolic heart failure which is resolved and deconditioning and truncal obesity.  Prosthetic aortic valve is functioning normally.  He has not had any recurrence of angina pectoris.  Otherwise no changes in the medications were done today, I will see him back in 6 months or sooner if problems.     Adrian Prows, MD, [facility] 04/28/2021, 5:58 PM Office: [phone]

## 2021-05-04 NOTE — Progress Notes (Signed)
[facility] Note  05/05/2021     CHIEF COMPLAINT Patient presents for Retina Follow Up   HISTORY OF PRESENT ILLNESS: Cody Phelps is a 80 y.o. male who presents to the clinic today for:  HPI     Retina Follow Up   Patient presents with  Other.  In right eye.  Severity is moderate.  Duration of 6 weeks.  Since onset it is stable.  I, the attending physician,  performed the HPI with the patient and updated documentation appropriately.        Comments   Pt here for 6 wk ret f/u for CME OD. Pt states vision may be getting a bit better though not sure. Blood sugar was in the 160s last eve. No reports of changes or issues.       Last edited by [name], MD on 05/09/2021  1:06 AM.     Referring physician: [name], MD [place],  [place] [address]  HISTORICAL INFORMATION:   Selected notes from the [facility] Referred by Dr. [name] for concern of RD OD   CURRENT MEDICATIONS: Current Outpatient Medications (Ophthalmic Drugs)  Medication Sig   Bromfenac Sodium (PROLENSA) 0.07 % SOLN Place 1 drop into the right eye 4 (four) times daily.   prednisoLONE acetate (PRED FORTE) 1 % ophthalmic suspension Place 1 drop into the right eye 4 (four) times daily.   No current facility-administered medications for this visit. (Ophthalmic Drugs)   Current Outpatient Medications (Other)  Medication Sig   amiodarone (PACERONE) 200 MG tablet Take 200 mg by mouth daily.   atorvastatin (LIPITOR) 80 MG tablet TAKE 1 TABLET BY MOUTH  DAILY   colesevelam (WELCHOL) 625 MG tablet Take 625 mg by mouth 3 (three) times daily.    donepezil (ARICEPT) 10 MG tablet Take 10 mg by mouth every morning.    esomeprazole (NEXIUM) 40 MG capsule Take 40 mg by mouth daily.    ferrous sulfate 325 (65 FE) MG tablet TAKE 1 TABLET BY MOUTH EVERY DAY WITH BREAKFAST   gabapentin (NEURONTIN) 300 MG capsule    insulin glargine (LANTUS) 100 UNIT/ML  injection Inject 20-40 Units into the skin at bedtime as needed (BGL below 140-150 take 20 units, if 151 or higher take 40 units).   memantine (NAMENDA) 10 MG tablet Take 1 tablet (10 mg total) by mouth 2 (two) times daily.   metoprolol tartrate (LOPRESSOR) 25 MG tablet TAKE 1 TABLET BY MOUTH  DAILY (Patient taking differently: Take 12.5 mg by mouth 2 (two) times daily.)   mirabegron ER (MYRBETRIQ) 50 MG TB24 tablet Take 50 mg by mouth at bedtime.    nitroGLYCERIN (NITROSTAT) 0.4 MG SL tablet Place 1 tablet (0.4 mg total) under the tongue every 5 (five) minutes as needed for up to 25 days for chest pain.   omega-3 acid ethyl esters (LOVAZA) 1 G capsule Take 2 g by mouth daily.   rOPINIRole (REQUIP) 2 MG tablet Take 2 mg by mouth 3 (three) times daily as needed (RLS).    spironolactone (ALDACTONE) 25 MG tablet TAKE ONE-HALF TABLET BY  MOUTH DAILY   tamsulosin (FLOMAX) 0.4 MG CAPS capsule Take 0.4 mg by mouth daily after supper.    torsemide (DEMADEX) 20 MG tablet Take 1 tablet (20 mg total) by mouth as directed. 2 times a week on Tuesday and Thursday.  Take additional dose as directed with weight gain of 2 Lbs   valsartan-hydrochlorothiazide (  DIOVAN-HCT) 80-12.5 MG tablet TAKE 1 TABLET BY MOUTH IN  THE MORNING   vitamin B-12 (CYANOCOBALAMIN) 1000 MCG tablet Take 1,000 mcg by mouth 2 (two) times daily.   XARELTO 15 MG TABS tablet TAKE 1 TABLET BY MOUTH IN  THE EVENING AFTER DINNER   No current facility-administered medications for this visit. (Other)   REVIEW OF SYSTEMS: ROS   Positive for: Musculoskeletal, Endocrine, Cardiovascular, Eyes, Respiratory Negative for: Constitutional, Gastrointestinal, Neurological, Skin, Genitourinary, HENT, Psychiatric, Allergic/Imm, Heme/Lymph Last edited by Kingsley Spittle, COT on 05/05/2021  1:43 PM.    ALLERGIES No Known Allergies  PAST MEDICAL HISTORY Past Medical History:  Diagnosis Date   Acute anterior wall MI ([place]) 03/01/2018   Acute combined  systolic and diastolic heart failure ([place]) 03/15/2018   Burn    Chronic diastolic CHF (congestive heart failure) ([place]) 09/21/2018   Defect, retina, with detachment    Dementia ([place])    Diverticulosis    ED (erectile dysfunction)    First degree heart block    GERD (gastroesophageal reflux disease)    Heart murmur    History of blood transfusion    with open heart surgery   Hypertension    cardiologsit-  dr [name]   Hypertensive retinopathy    OU   Iron deficiency anemia    Mixed hyperlipidemia    Morbid obesity ([place])    Myocardial infarction ([place])    02/28/18   OA (osteoarthritis)    right hip   OSA on CPAP    followed by dr dohmeier, uses CPAP   PAF (paroxysmal atrial fibrillation) ([place])    a. on Eliquis   Prostate cancer ([place])    dx 03-17-2017 (bx)  Stage T2a, Gleason 4+4, PSA  4.43, vol 32.32--  s/p radiatctive prostate seed implants 07-10-2017 then IMRT and ADT   Retinal detachment    Rheg. 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   LIMA to Diagonal [place]   S/P [place] operation for atrial fibrillation 05/30/2018   Complete bilateral atrial lesion set using bipolar radiofrequency and cryothermy ablation with clipping of LA appendage   Scoliosis    Trigger finger    Type 2 diabetes mellitus ([place])    Wears partial dentures    upper and lower   Past Surgical History:  Procedure Laterality Date   AORTIC VALVE REPLACEMENT N/A 05/30/2018   Procedure: AORTIC VALVE REPLACEMENT (AVR) using Inspiris Valve, Size 23;  Surgeon: [name], MD;  Location: [place];  Service: Open Heart Surgery;  Laterality: N/A;   APPENDECTOMY  1988   BILATERAL TOTAL ETHMOIDECTOMY AND SPHENOIDECTOMY  01-14-2009    dr Redmond Baseman   [facility]   CARDIOVERSION N/A 08/10/2018   Procedure: CARDIOVERSION;  Surgeon: Adrian Prows, MD;  Location: [place];  Service: Cardiovascular;  Laterality: N/A;   CARPAL TUNNEL RELEASE Bilateral 2013   CATARACT EXTRACTION Bilateral    Dr. [name]   CATARACT EXTRACTION W/ INTRAOCULAR LENS  IMPLANT, BILATERAL  date?   CORONARY ARTERY BYPASS GRAFT N/A 05/30/2018   CORONARY BALLOON ANGIOPLASTY N/A 03/01/2018   Procedure: CORONARY BALLOON ANGIOPLASTY;  Surgeon: Adrian Prows, MD;  Location: [place] [facility];  Service: Cardiovascular;  Laterality: N/A;   CORONARY/GRAFT ACUTE MI REVASCULARIZATION N/A 03/01/2018   Procedure: Coronary/Graft Acute MI Revascularization;  Surgeon: Adrian Prows, MD;  Location: [place] [facility];  Service: Cardiovascular;  Laterality: N/A;   CYSTOSCOPY  07/19/2017   Procedure: CYSTOSCOPY;  Surgeon: Nickie Retort, MD;  Location: [facility];  Service: Urology;;  No seeds found in [place] Bilateral    Cat Sx OU.  Dislocated IOL sx OD.  Rheg. RD repair OS.   EYE SURGERY     GAS/FLUID EXCHANGE Right 06/13/2019   Procedure: GAS/FLUID EXCHANGE;  Surgeon: [name], MD;  Location: [place];  Service: Ophthalmology;  Laterality: Right;   LAMINECTOMY WITH POSTERIOR LATERAL ARTHRODESIS LEVEL 2 N/A 02/12/2014   Procedure: LUMBAR TWO-THREE,LUIMBAR THREE-FOUR LAMINECTOMY/FORAMINOTOMY;POSSIBLE POSTEROLATERAL ARTHRODESIS WITH AUTOGRAFT;  Surgeon: [name], MD;  Location: [facility];  Service: Neurosurgery;  Laterality: N/A;   LEFT HEART CATH AND CORONARY ANGIOGRAPHY N/A 03/01/2018   Procedure: LEFT HEART CATH AND CORONARY ANGIOGRAPHY;  Surgeon: Adrian Prows, MD;  Location: [place] [facility];  Service: Cardiovascular;  Laterality: N/A;   MAZE N/A 05/30/2018   Procedure: MAZE;  Surgeon: [name], MD;  Location: [place];  Service: Open Heart Surgery;  Laterality: N/A;   ORIF RIGHT ANKLE FX'S  05/05/2001   retained hardware   PARS PLANA VITRECTOMY Right 06/13/2019   Procedure: PARS PLANA VITRECTOMY WITH 25 GAUGE WITH LASER AND GAS;  Surgeon: [name], MD;  Location: [place];  Service: Ophthalmology;  Laterality: Right;   PARS PLANA VITRECTOMY Right 09/12/2019   Procedure: Pars Plana Vitrectomy With 25 Gauge;  Surgeon: [name], MD;  Location: [place];  Service: Ophthalmology;  Laterality: Right;   PERFLUORONE INJECTION Right 06/13/2019   Procedure: PERFLUORONE INJECTION;  Surgeon: [name], MD;  Location: [place];  Service: Ophthalmology;  Laterality: Right;   PHOTOCOAGULATION WITH LASER Right 06/13/2019   Procedure: PHOTOCOAGULATION WITH LASER;  Surgeon: [name], MD;  Location: [place];  Service: Ophthalmology;  Laterality: Right;   PLACEMENT AND SUTURE OF SECONDARY INTRAOCULAR LENS Right 09/12/2019   Procedure: PLACEMENT AND SUTURE OF SECONDARY INTRAOCULAR LENS;  Surgeon: [name], MD;  Location: [place];  Service: Ophthalmology;  Laterality: Right;   PROSTATE BIOPSY  03-17-2017   dr Pilar Jarvis office   RADIOACTIVE SEED IMPLANT N/A 07/19/2017   Procedure: RADIOACTIVE SEED IMPLANT/BRACHYTHERAPY IMPLANT;  Surgeon: Nickie Retort, MD;  Location: [facility];  Service: Urology;  Laterality: N/A;  69 seeds implanted   REMOVAL RETAINED LENS Right 09/12/2019   Procedure: REMOVAL RETAINED LENS;  Surgeon: [name], MD;  Location: [place];  Service: Ophthalmology;  Laterality: Right;   RETINAL DETACHMENT SURGERY Right 06/13/2019   Dr. Coralyn Pear   SKIN GRAFT     face   SPACE OAR INSTILLATION N/A 07/19/2017   Procedure: SPACE OAR INSTILLATION;  Surgeon: Nickie Retort, MD;  [facility];  Service: Urology;  Laterality: N/A;   TEE WITHOUT CARDIOVERSION  03/20/2012   Procedure: TRANSESOPHAGEAL ECHOCARDIOGRAM (TEE);  Surgeon: Laverda Page, MD;  Location: [facility];  Service: Cardiovascular;  Laterality: N/A;  normal LV; normal EF; normal RV; normal LA w/ left atrial appendage very small, normal function, interatrial septum intact without defect; normal RA; trace MR,TR, & PI; mild AV  calcification and senile degeneration w/ mild stenosis, AVA 1.7cm^2;;    TEE WITHOUT CARDIOVERSION N/A 05/30/2018   Procedure: TRANSESOPHAGEAL ECHOCARDIOGRAM (TEE);  Surgeon: [name], MD;  Location: [place];  Service: Open Heart Surgery;  Laterality: N/A;   TOTAL HIP ARTHROPLASTY Right 10/23/2017  Procedure: TOTAL HIP ARTHROPLASTY ANTERIOR APPROACH;  Surgeon: Frederik Pear, MD;  Location: [place];  Service: Orthopedics;  Laterality: Right;   TRANSTHORACIC ECHOCARDIOGRAM  01-11-2017   dr [name]  (per echo note, no significant change in seveity of AS, no other diagnostic change)   moderate concentric LVH, ef 26%, grade 1 diastolic dysfunction/  moderate LAE/  mild , grade 1 AR w/ moderate AV calcification, mild to moderate restricted AV leaflets w/ moderate AS, AVA 1.16cm^2, peak grandient 63mHg, mean grandient 332mg/  trace MR, mild calcification MV annulus , mild MV leaflet calcification, mild MVS, peak grandient 4.36m736m, mean grandient 2.7mm54m trace TR    FAMILY HISTORY Family History  Problem Relation Age of Onset   Stroke Mother 80  78ypertension Mother    Hypertension Father    Heart disease Father    Heart attack Father 70  6eart murmur Father    Hypertension Sister    Lung disease Brother    Colon cancer Neg Hx    Stomach cancer Neg Hx    Rectal cancer Neg Hx    Pancreatic cancer Neg Hx     SOCIAL HISTORY Social History   Tobacco Use   Smoking status: Former    Packs/day: 0.25    Years: 1.00    Pack years: 0.25    Types: Cigarettes    Quit date: 02/04/1967    Years since quitting: 54.2   Smokeless tobacco: Never   Tobacco comments:    smoked 1 q2-3 days  Vaping Use   Vaping Use: Never used  Substance Use Topics   Alcohol use: Yes    Comment: occasional beer   Drug use: No       OPHTHALMIC EXAM:  Base Eye Exam     Visual Acuity (Snellen - Linear)       Right Left   Dist [place] 20/70 +2 20/30 -1   Dist ph  20/30 -2 20/20    Correction: Glasses          Tonometry (Tonopen, 2:03 PM)       Right Left   Pressure 18 15         Pupils       Dark Light Shape React APD   Right 4 3 Round Brisk None   Left 4 3 Round Brisk None         Visual Fields (Counting fingers)       Left Right    Full Full         Extraocular Movement       Right Left    Full, Ortho Full, Ortho         Neuro/Psych     Oriented x3: Yes   Mood/Affect: Normal         Dilation     Both eyes: 1.0% Mydriacyl, 2.5% Phenylephrine @ 2:03 PM           Slit Lamp and Fundus Exam     Slit Lamp Exam       Right Left   Lids/Lashes mild Meibomian gland dysfunction, mild Telangiectasia Dermatochalasis - upper lid, Meibomian gland dysfunction, Edema, Erythema lower lid, positive concretion left palp conj   Conjunctiva/Sclera White and quiet, residual STK ST quad Temporal Pinguecula   Cornea well healed superior corneal wound, trace Punctate epithelial erosions, Arcus, mild tear film debris, fine endo pigment 1+ Punctate epithelial erosions, +verticellata, trace endo pigment, Debris in tear film   Anterior Chamber Deep and quiet Deep  and quiet   Iris Round and dilated to 50m, scattered Transillumination defects 360, +iridodenesis, +pigment deposition Round and dilated   Lens Sutured Akreos IOL well centered  3 piece Posterior chamber intraocular in good position, trace Posterior capsular opacification   Vitreous post vitrectomy, trace pigment Vitreous syneresis, Posterior vitreous detachment         Fundus Exam       Right Left   Disc 1+Pallor, Sharp rim 2+Pallor, Sharp rim, temporal PPP, mild tilt   C/D Ratio 0.3 0.5   Macula Flat, Blunted foveal reflex, mild RPE mottling and clumping, persistent central cystic changes - slightly increased, +pigment deposition on retinal surface, scattered MA Flat, Blunted foveal reflex, Retinal pigment epithelial mottling and clumping, No heme or edema   Vessels Mild Vascular attenuation, mild Tortuousity,  mild A/V crossing changes Vascular attenuation, mild Tortuous   Periphery attached, good 360 laser in place; ORIGINALLY: bullous superior retinal detachment from 1000-0130, another more shallow detachment lobe from 130-400, lattice and micro tears ST quadrant; Old retinal tear at 0900 with surrounding laser Attached, peripheral laser scars at 1030, No new RT/RD           Refraction     Wearing Rx       Sphere Cylinder Axis Add   Right -1.25 +1.50 025 +2.75   Left -1.00 +1.75 180 +2.75         Manifest Refraction       Sphere Cylinder Axis Dist VA   Right -1.25 +1.50 025 20/30-1   Left               IMAGING AND PROCEDURES  Imaging and Procedures for _0 @  OCT, Retina - OU - Both Eyes       Right Eye Quality was good. Central Foveal Thickness: 364. Progression has worsened. Findings include normal foveal contour, no SRF, intraretinal fluid (Interval increase in cystic changes nasal fovea, trace ERM).   Left Eye Quality was good. Central Foveal Thickness: 268. Progression has been stable. Findings include normal foveal contour, no IRF, no SRF.   Notes *Images captured and stored on drive  Diagnosis / Impression:  OD: Interval increase in cystic changes nasal fovea, trace ERM OS: NFP, no IRF/SRF   Clinical management:  See below  Abbreviations: NFP - Normal foveal profile. CME - cystoid macular edema. PED - pigment epithelial detachment. IRF - intraretinal fluid. SRF - subretinal fluid. EZ - ellipsoid zone. ERM - epiretinal membrane. ORA - outer retinal atrophy. ORT - outer retinal tubulation. SRHM - subretinal hyper-reflective material            ASSESSMENT/PLAN:    ICD-10-CM   1. Dislocation of intraocular lens, sequela  T85.22XS     2. Aphakia of right eye  H27.01     3. Right retinal detachment  H33.21     4. 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 Retinal edema  H35.81 OCT, Retina - OU - Both Eyes    6. History of repair of  retinal tear by laser photocoagulation  Z98.890     7. Diabetes mellitus type 2 without retinopathy ([place]  E11.9     8. Essential hypertension  I10     9. Hypertensive retinopathy of both eyes  H35.033     10. Pseudophakia of both eyes  Z96.1     11. Cystoid macular edema of right eye  H35.351      1,2. Dislocated IOL OD  -  pt with history of partially dislocated 3-piece IOL -- spontaneously dislocated completely into vitreous cavity on 1.23.21  - s/p 25g PPV w/ IOL explantation and secondary sutured IOL OD -- Akreos AO60 lens, 17.5D -- 02.11.2021             - IOL in good position  - 2 superior nylon sutures removed at slit lamp 03.12.21 -- last nylon suture removed at slit lamp, 04.30.21  - corneal edema resolved; PEE improved             - s/p STK OD #1 (07.09.21), #2 (01.26.22), #3 (08.30.22) for CME  - BCVA 20/30 from 20/25  - OCT shows persistent IRF/CME -- slightly increased (see #11 below)  - IOP okay at 18  - ATs QID OU              - f/u 4-6 wks --DFE/OCT   3-5. Rhegmatogenous retinal detachment, right eye  - bullous, superior, mac off detachment, onset of foveal involvement 11.11.20 by history  - Bi-lobed superior detachment, superior lobe spanning 1000-0130, nasal lobe 0130-0400  - lattice degeneration with microtears noted at 1030  - s/p PPV/PFC/EL/FAX/14% C3F8 OD, 11.12.20             - intraop: HST at 2 oclock was found; also detachment had progressed to subtotal detachment spanning 9 oclock to 6 oclock (going in clockwise direction); also severe zonular insufficiency with IOL very mobile throughout case -- did not dislocate completely during the case or immediately post op  - doing well              - retina attached and in good position--good laser in place  6. History of retinal defects s/p laser retinopexy OU with Dr. Zigmund Daniel in 2013  - OD laser at 0900  - OS laser at 1030  - stable  7. Diabetes mellitus, type 2 without retinopathy  - A1c was 6.2 in August  2022  - The incidence, risk factors for progression, natural history and treatment options for diabetic retinopathy  were discussed with patient.    - The need for close monitoring of blood glucose, blood pressure, and serum lipids, avoiding cigarette or any type of tobacco, and the need for long term follow up was also discussed with patient  8,9. Hypertensive retinopathy OU  - discussed importance of tight BP control  - monitor  10. Pseudophakia OU  - s/p CE/IOL OU (Dr. [name])  - 3 piece IOL OD was completely displaced into vitreous -- now with sutured Akreos IOL in good position OD -- see above  - OS 3-piece IOL in good position  - monitor  11. CME OD  - post op edema  - s/p STK OD #1 07.09.21, #2 (01.26.22), #3 (08.24.22)  - today, interval increase in cystic changes nasal fovea -- 6 wks post STK  - cont PF QID OD and Prolensa QID OD  - f/u 6 weeks DFE, OCT  Ophthalmic Meds Ordered this visit:  Meds ordered this encounter  Medications   Bromfenac Sodium (PROLENSA) 0.07 % SOLN    Sig: Place 1 drop into the right eye 4 (four) times daily.    Dispense:  3 mL    Refill:  3     Return in about 6 weeks (around 06/16/2021) for f/u CME OD, DFE, OCT.  There are no Patient Instructions on file for this visit.  This document serves as a record of services personally performed by Gardiner Sleeper, MD,  PhD. It was created on their behalf by Estill Bakes, [place] an ophthalmic technician. The creation of this record is the provider's dictation and/or activities during the visit.    Electronically signed by: Estill Bakes, COT 10.4.22 @ 1:07 AM   This document serves as a record of services personally performed by Gardiner Sleeper, MD, PhD. It was created on their behalf by San Jetty. [place]ark, OA an ophthalmic technician. The creation of this record is the provider's dictation and/or activities during the visit.    Electronically signed by: San Jetty. [place], [place] 10.05.2022 1:07 AM   Gardiner Sleeper, M.D., Ph.D. Diseases & Surgery of the Retina and Vitreous [facility]  I have reviewed the above documentation for accuracy and completeness, and I agree with the above. Gardiner Sleeper, M.D., Ph.D. 05/09/21 1:10 AM  Abbreviations: M myopia (nearsighted); A astigmatism; H hyperopia (farsighted); P presbyopia; Mrx spectacle prescription;  CTL contact lenses; OD right eye; OS left eye; OU both eyes  XT exotropia; ET esotropia; PEK punctate epithelial keratitis; PEE punctate epithelial erosions; DES dry eye syndrome; MGD meibomian gland dysfunction; ATs artificial tears; PFAT's preservative free artificial tears; [place] nuclear sclerotic cataract; PSC posterior subcapsular cataract; ERM epi-retinal membrane; PVD posterior vitreous detachment; RD retinal detachment; DM diabetes mellitus; DR diabetic retinopathy; NPDR non-proliferative diabetic retinopathy; PDR proliferative diabetic retinopathy; CSME clinically significant macular edema; DME diabetic macular edema; dbh dot blot hemorrhages; CWS cotton wool spot; POAG primary open angle glaucoma; C/D cup-to-disc ratio; HVF humphrey visual field; GVF goldmann visual field; OCT optical coherence tomography; IOP intraocular pressure; BRVO Branch retinal vein occlusion; CRVO central retinal vein occlusion; CRAO central retinal artery occlusion; BRAO branch retinal artery occlusion; RT retinal tear; SB scleral buckle; PPV pars plana vitrectomy; VH Vitreous hemorrhage; PRP panretinal laser photocoagulation; IVK intravitreal kenalog; VMT vitreomacular traction; MH Macular hole;  NVD neovascularization of the disc; NVE neovascularization elsewhere; AREDS age related eye disease study; ARMD age related macular degeneration; POAG primary open angle glaucoma; EBMD epithelial/anterior basement membrane dystrophy; ACIOL anterior chamber intraocular lens; IOL intraocular lens; PCIOL posterior chamber intraocular lens; Phaco/IOL phacoemulsification  with intraocular lens placement; [place] photorefractive keratectomy; LASIK laser assisted in situ keratomileusis; HTN hypertension; DM diabetes mellitus; COPD chronic obstructive pulmonary disease

## 2021-05-05 ENCOUNTER — Encounter (INDEPENDENT_AMBULATORY_CARE_PROVIDER_SITE_OTHER): Payer: Self-pay | Admitting: Ophthalmology

## 2021-05-05 ENCOUNTER — Ambulatory Visit (INDEPENDENT_AMBULATORY_CARE_PROVIDER_SITE_OTHER): Payer: Medicare Other | Admitting: Ophthalmology

## 2021-05-05 ENCOUNTER — Other Ambulatory Visit: Payer: Self-pay

## 2021-05-05 DIAGNOSIS — E119 Type 2 diabetes mellitus without complications: Secondary | ICD-10-CM | POA: Diagnosis not present

## 2021-05-05 DIAGNOSIS — Z961 Presence of intraocular lens: Secondary | ICD-10-CM

## 2021-05-05 DIAGNOSIS — H35411 Lattice degeneration of retina, right eye: Secondary | ICD-10-CM

## 2021-05-05 DIAGNOSIS — H3321 Serous retinal detachment, right eye: Secondary | ICD-10-CM

## 2021-05-05 DIAGNOSIS — Z9889 Other specified postprocedural states: Secondary | ICD-10-CM

## 2021-05-05 DIAGNOSIS — H35351 Cystoid macular degeneration, right eye: Secondary | ICD-10-CM

## 2021-05-05 DIAGNOSIS — I10 Essential (primary) hypertension: Secondary | ICD-10-CM

## 2021-05-05 DIAGNOSIS — H35033 Hypertensive retinopathy, bilateral: Secondary | ICD-10-CM | POA: Diagnosis not present

## 2021-05-05 DIAGNOSIS — H3581 Retinal edema: Secondary | ICD-10-CM

## 2021-05-05 DIAGNOSIS — H2701 Aphakia, right eye: Secondary | ICD-10-CM

## 2021-05-05 DIAGNOSIS — T8522XS Displacement of intraocular lens, sequela: Secondary | ICD-10-CM

## 2021-05-05 MED ORDER — PROLENSA 0.07 % OP SOLN: 1.0000 [drp] | Freq: Four times a day (QID) | OPHTHALMIC | 3 refills | Status: DC

## 2021-05-06 ENCOUNTER — Telehealth: Payer: Self-pay | Admitting: Neurology

## 2021-05-06 ENCOUNTER — Other Ambulatory Visit: Payer: Self-pay | Admitting: Neurology

## 2021-05-06 DIAGNOSIS — G4733 Obstructive sleep apnea (adult) (pediatric): Secondary | ICD-10-CM

## 2021-05-06 NOTE — Telephone Encounter (Signed)
Called the wife back and advised that I spoke with the DME company and appears that the order never went through on our end. Advised I have spoke with the person at the DME company and she has received the order and will get that process going for the pt. Advised the wife if a week goes by and she has not heard anything to give [place] another call. Pt verbalized understanding.

## 2021-05-06 NOTE — Telephone Encounter (Signed)
Pt's wife, Butch Penny (on [place]) called, have not heard from anyone about getting a new CPAP machine prescription. Light is on he needs a new one. Would like a call from the nurse.

## 2021-05-06 NOTE — Telephone Encounter (Signed)
I have reached out to the DME company to see if they have an update on this and to have them reach out to the patient. Looks like the orders should have been sent on 8/10.

## 2021-05-09 ENCOUNTER — Encounter (INDEPENDENT_AMBULATORY_CARE_PROVIDER_SITE_OTHER): Payer: Self-pay | Admitting: Ophthalmology

## 2021-05-09 NOTE — Procedures (Signed)
[facility] TEST REPORT ( by Watch PAT)   STUDY DATA from 04-26-2021:   DOB:  [birth year]    ORDERING CLINICIAN: [name], MD  REFERRING CLINICIAN: Adrian Prows, MD    CLINICAL INFORMATION/HISTORY: Cody Phelps is a 80 - year old Caucasian male patient seen on 03/10/2021 , upon consultation request from Dr [name]. The patient had the first sleep study in the year 2015  with a result of an AHI ( Apnea Hypopnea index)  of 41.8/h and PLM arousals 10/h.  Nadir SpO2 was 87%.  The study was a split-night titration and the patient's second part of the night was dedicated to finding the best setting.  He ended up with a setting of 10 cmH2O pressure was 1 cm EPR.  Previously diagnosed as :Obstructive sleep apnea  S/P aortic valve replacement with bioprosthetic valve  Chronic diastolic CHF (congestive heart failure) ([name])  Pericardial effusion  Paroxysmal atrial fibrillation ([name])  RLS (restless legs syndrome)  Lumbar stenosis with neurogenic claudication. Dr Saintclair Halsted     Epworth sleepiness score: 17/24. FSS endorsed at 59/ 63 points, a hugh degree of fatigue.    BMI: 32 kg/m   Neck Circumference: 19"   FINDINGS:   Sleep Summary: The total recording time for this home sleep test amounted to 9 hours and 49 minutes of which 6 hours and 42 minutes with a calculated total sleep time with a 17% REM sleep proportion.                               Respiratory Indices:   Total AHI for this patient was 41.1/h during REM sleep there were only 12 events per hour and during non-REM sleep 46.3 events per hour.  This is an unusual distribution which indicates the possible presence of central sleep apnea.  The watch pat calculation was that 22% of total apneas were central in origin.  Positional AHI in supine sleep the AHI amounted to 47.3/h in prone sleep 5.5/h and there was no apnea in the right lateral sleep noted.  There were also only 12.5 minutes of sleep in right lateral  position seen.  The snoring level amounted to 41 dB at the mean volume.  The patient snored moderately loud for 25% of the total sleep time.                                                                   Oxygen Saturation Statistics:   O2 Saturation Range (%):    Oxygen saturation ranged between a nadir of 85% and a maximum of 99% with a mean oxygenation of 95%.  Total time in hypoxemia was 1.3 minutes at amounted to only 0.3% of total sleep.  It was therefore considered clinically irrelevant.                                   O2 Saturation (minutes) <89%: 1.3 min.          Pulse Rate Statistics:         Pulse Range:   Varied between a maximum of 77 bpm and  a minimum at 30 bpm.  The mean heart rate was 57 bpm.  Please note that this home sleep test device cannot give additional information about cardiac rhythm only the cardiac rate is recorded.              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  Since the patient is an experienced CPAP user, I will not require him to return for in -lab titration, in spite of the complex apnea diagnosis.      RECOMMENDATION: Since the patient is an experienced CPAP user, I will reorder an autotitration capable device for him. A pressure range of 7-15 cm water with 3 cm EPR will be provided and a nasal mask of his choice, all with heated humidification.  Our NP will follow once the patient is established on this new CPAP machine.    INTERPRETING PHYSICIAN:   [name], MD   Medical Director of [facility].

## 2021-05-09 NOTE — Addendum Note (Signed)
Addended by: [name] on: 05/09/2021 06:40 PM   Modules accepted: Orders

## 2021-05-09 NOTE — Progress Notes (Signed)
Total AHI for this patient was 41.1/h during REM sleep there were only 12 events per hour and during non-REM sleep 46.3 events per hour.  This is an unusual distribution which indicates the possible presence of central sleep apnea.   The watch pat calculation stated that 22% of total apneas were central in origin.  Positional AHI in supine sleep the AHI amounted to 47.3/h in prone sleep 5.5/h and there was no apnea in the right lateral sleep noted.  There were also only 12.5 minutes of sleep in right lateral position seen.  The snoring level amounted to 41 dB at the mean volume.  The patient snored moderately loud for 25% of the total sleep time. 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  Since the patient is an experienced CPAP user, I will not require him to return for in -lab titration, in spite of the complex apnea diagnosis.    RECOMMENDATION: Since the patient is an experienced CPAP user, I will reorder an autotitration capable device for him. A pressure range of 7-15 cm water with 3 cm EPR will be provided and a nasal mask of his choice, all with heated humidification.  Our NP will follow once the patient is established on this new CPAP machine.

## 2021-05-10 ENCOUNTER — Telehealth: Payer: Self-pay | Admitting: Neurology

## 2021-05-10 NOTE — Telephone Encounter (Signed)
Called the patient and reviewed the sleep study results. Advised of the findings. Informed the patient that the cpap order has been sent to [facility]. They should be working on the process and getting him scheduled. The setting that was sent to the company was set at the same pressure as the machine that was ordered.

## 2021-05-10 NOTE — Telephone Encounter (Signed)
-----   Message from [name], MD sent at 05/09/2021  6:40 PM EDT ----- Total AHI for this patient was 41.1/h during REM sleep there were only 12 events per hour and during non-REM sleep 46.3 events per hour.  This is an unusual distribution which indicates the possible presence of central sleep apnea.   The watch pat calculation stated that 22% of total apneas were central in origin.  Positional AHI in supine sleep the AHI amounted to 47.3/h in prone sleep 5.5/h and there was no apnea in the right lateral sleep noted.  There were also only 12.5 minutes of sleep in right lateral position seen.  The snoring level amounted to 41 dB at the mean volume.  The patient snored moderately loud for 25% of the total sleep time. 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  Since the patient is an experienced CPAP user, I will not require him to return for in -lab titration, in spite of the complex apnea diagnosis.     RECOMMENDATION: Since the patient is an experienced CPAP user, I will reorder an autotitration capable device for him. A pressure range of 7-15 cm water with 3 cm EPR will be provided and a nasal mask of his choice, all with heated humidification.  Our NP will follow once the patient is established on this new CPAP machine.

## 2021-05-14 ENCOUNTER — Other Ambulatory Visit: Payer: Self-pay | Admitting: Cardiology

## 2021-06-15 NOTE — Progress Notes (Signed)
[facility] Note  06/16/2021     CHIEF COMPLAINT Patient presents for Retina Follow Up   HISTORY OF PRESENT ILLNESS: Cody Phelps is a 80 y.o. male who presents to the clinic today for:  HPI     Retina Follow Up   Patient presents with  Other.  In right eye.  This started 6 weeks ago.  I, the attending physician,  performed the HPI with the patient and updated documentation appropriately.        Comments   Patient here for 6 weeks retina follow up for CME OD. Patient states vision about the same. No eye pain.       Last edited by [name], MD on 06/17/2021 11:40 PM.      Referring physician: [name], MD [address],  [place] [address]  HISTORICAL INFORMATION:   Selected notes from the [facility] Referred by Dr. [name] for concern of RD OD   CURRENT MEDICATIONS: Current Outpatient Medications (Ophthalmic Drugs)  Medication Sig   Bromfenac Sodium (PROLENSA) 0.07 % SOLN Place 1 drop into the right eye 4 (four) times daily.   prednisoLONE acetate (PRED FORTE) 1 % ophthalmic suspension Place 1 drop into the right eye 4 (four) times daily.   No current facility-administered medications for this visit. (Ophthalmic Drugs)   Current Outpatient Medications (Other)  Medication Sig   atorvastatin (LIPITOR) 80 MG tablet TAKE 1 TABLET BY MOUTH  DAILY   colesevelam (WELCHOL) 625 MG tablet Take 625 mg by mouth 3 (three) times daily.    donepezil (ARICEPT) 10 MG tablet Take 10 mg by mouth every morning.    esomeprazole (NEXIUM) 40 MG capsule Take 40 mg by mouth daily.    ferrous sulfate 325 (65 FE) MG tablet TAKE 1 TABLET BY MOUTH EVERY DAY WITH BREAKFAST   gabapentin (NEURONTIN) 300 MG capsule    insulin glargine (LANTUS) 100 UNIT/ML injection Inject 20-40 Units into the skin at bedtime as needed (BGL below 140-150 take 20 units, if 151 or higher take 40 units).   memantine (NAMENDA) 10 MG tablet Take 1 tablet  (10 mg total) by mouth 2 (two) times daily.   metoprolol tartrate (LOPRESSOR) 25 MG tablet TAKE 1 TABLET BY MOUTH  DAILY (Patient taking differently: Take 12.5 mg by mouth 2 (two) times daily.)   mirabegron ER (MYRBETRIQ) 50 MG TB24 tablet Take 50 mg by mouth at bedtime.    omega-3 acid ethyl esters (LOVAZA) 1 G capsule Take 2 g by mouth daily.   rOPINIRole (REQUIP) 2 MG tablet Take 2 mg by mouth 3 (three) times daily as needed (RLS).    spironolactone (ALDACTONE) 25 MG tablet TAKE ONE-HALF TABLET BY  MOUTH DAILY   tamsulosin (FLOMAX) 0.4 MG CAPS capsule Take 0.4 mg by mouth daily after supper.    torsemide (DEMADEX) 20 MG tablet Take 1 tablet (20 mg total) by mouth as directed. 2 times a week on Tuesday and Thursday.  Take additional dose as directed with weight gain of 2 Lbs   vitamin B-12 (CYANOCOBALAMIN) 1000 MCG tablet Take 1,000 mcg by mouth 2 (two) times daily.   XARELTO 15 MG TABS tablet TAKE 1 TABLET BY MOUTH IN  THE EVENING AFTER DINNER   amiodarone (PACERONE) 200 MG tablet Take 200 mg by mouth daily. (Patient not taking: Reported on 06/16/2021)   nitroGLYCERIN (NITROSTAT) 0.4 MG SL tablet Place 1 tablet (0.4 mg total) under the tongue every  5 (five) minutes as needed for up to 25 days for chest pain.   valsartan-hydrochlorothiazide (DIOVAN-HCT) 80-12.5 MG tablet TAKE 1 TABLET BY MOUTH IN  THE MORNING (Patient not taking: Reported on 06/16/2021)   No current facility-administered medications for this visit. (Other)   REVIEW OF SYSTEMS: ROS   Positive for: Musculoskeletal, Endocrine, Cardiovascular, Eyes, Respiratory Negative for: Constitutional, Gastrointestinal, Neurological, Skin, Genitourinary, HENT, Psychiatric, Allergic/Imm, Heme/Lymph Last edited by [name], COA on 06/16/2021  2:16 PM.     ALLERGIES No Known Allergies  PAST MEDICAL HISTORY Past Medical History:  Diagnosis Date   Acute anterior wall MI ([place]) 03/01/2018   Acute combined systolic and diastolic heart  failure ([place]) 03/15/2018   Burn    Chronic diastolic CHF (congestive heart failure) ([place]) 09/21/2018   Defect, retina, with detachment    Dementia ([place])    Diverticulosis    ED (erectile dysfunction)    First degree heart block    GERD (gastroesophageal reflux disease)    Heart murmur    History of blood transfusion    with open heart surgery   Hypertension    cardiologsit-  dr [name]   Hypertensive retinopathy    OU   Iron deficiency anemia    Mixed hyperlipidemia    Morbid obesity ([place])    Myocardial infarction ([place])    02/28/18   OA (osteoarthritis)    right hip   OSA on CPAP    followed by dr dohmeier, uses CPAP   PAF (paroxysmal atrial fibrillation) ([place])    a. on Eliquis   Prostate cancer ([place])    dx 03-17-2017 (bx)  Stage T2a, Gleason 4+4, PSA  4.43, vol 32.32--  s/p radiatctive prostate seed implants 07-10-2017 then IMRT and ADT   Retinal detachment    Rheg. 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   LIMA to Diagonal Branch   S/P Maze operation for atrial fibrillation 05/30/2018   Complete bilateral atrial lesion set using bipolar radiofrequency and cryothermy ablation with clipping of LA appendage   Scoliosis    Trigger finger    Type 2 diabetes mellitus ([place])    Wears partial dentures    upper and lower   Past Surgical History:  Procedure Laterality Date   AORTIC VALVE REPLACEMENT N/A 05/30/2018   Procedure: AORTIC VALVE REPLACEMENT (AVR) using Inspiris Valve, Size 23;  Surgeon: [name], MD;  Location: [place];  Service: Open Heart Surgery;  Laterality: N/A;   APPENDECTOMY  1988   BILATERAL TOTAL ETHMOIDECTOMY AND SPHENOIDECTOMY  01-14-2009    dr Redmond Baseman   [facility]   CARDIOVERSION N/A 08/10/2018   Procedure: CARDIOVERSION;  Surgeon: Adrian Prows, MD;  Location: [place];  Service: Cardiovascular;   Laterality: N/A;   CARPAL TUNNEL RELEASE Bilateral 2013   CATARACT EXTRACTION Bilateral    Dr. [name]   CATARACT EXTRACTION W/ INTRAOCULAR LENS  IMPLANT, BILATERAL  date?   CORONARY ARTERY BYPASS GRAFT N/A 05/30/2018   CORONARY BALLOON ANGIOPLASTY N/A 03/01/2018   Procedure: CORONARY BALLOON ANGIOPLASTY;  Surgeon: Adrian Prows, MD;  Location: [place] [facility];  Service: Cardiovascular;  Laterality: N/A;   CORONARY/GRAFT ACUTE MI REVASCULARIZATION N/A 03/01/2018   Procedure: Coronary/Graft Acute MI Revascularization;  Surgeon: Adrian Prows, MD;  Location: [place] [facility];  Service: Cardiovascular;  Laterality: N/A;  CYSTOSCOPY  07/19/2017   Procedure: CYSTOSCOPY;  Surgeon: Nickie Retort, MD;  Location: [facility];  Service: Urology;;  No seeds found in [place] Bilateral    Cat Sx OU.  Dislocated IOL sx OD.  Rheg. RD repair OS.   EYE SURGERY     GAS/FLUID EXCHANGE Right 06/13/2019   Procedure: GAS/FLUID EXCHANGE;  Surgeon: [name], MD;  Location: [place];  Service: Ophthalmology;  Laterality: Right;   LAMINECTOMY WITH POSTERIOR LATERAL ARTHRODESIS LEVEL 2 N/A 02/12/2014   Procedure: LUMBAR TWO-THREE,LUIMBAR THREE-FOUR LAMINECTOMY/FORAMINOTOMY;POSSIBLE POSTEROLATERAL ARTHRODESIS WITH AUTOGRAFT;  Surgeon: [name], MD;  Location: [facility];  Service: Neurosurgery;  Laterality: N/A;   LEFT HEART CATH AND CORONARY ANGIOGRAPHY N/A 03/01/2018   Procedure: LEFT HEART CATH AND CORONARY ANGIOGRAPHY;  Surgeon: Adrian Prows, MD;  Location: [place] [facility];  Service: Cardiovascular;  Laterality: N/A;   MAZE N/A 05/30/2018   Procedure: MAZE;  Surgeon: [name], MD;  Location: [place];  Service: Open Heart Surgery;  Laterality: N/A;   ORIF RIGHT ANKLE FX'S  05/05/2001   retained hardware   PARS PLANA VITRECTOMY Right 06/13/2019   Procedure: PARS PLANA VITRECTOMY WITH 25 GAUGE WITH LASER AND GAS;  Surgeon: [name], MD;  Location: [place];  Service:  Ophthalmology;  Laterality: Right;   PARS PLANA VITRECTOMY Right 09/12/2019   Procedure: Pars Plana Vitrectomy With 25 Gauge;  Surgeon: [name], MD;  Location: [place];  Service: Ophthalmology;  Laterality: Right;   PERFLUORONE INJECTION Right 06/13/2019   Procedure: PERFLUORONE INJECTION;  Surgeon: [name], MD;  Location: [place];  Service: Ophthalmology;  Laterality: Right;   PHOTOCOAGULATION WITH LASER Right 06/13/2019   Procedure: PHOTOCOAGULATION WITH LASER;  Surgeon: [name], MD;  Location: [place];  Service: Ophthalmology;  Laterality: Right;   PLACEMENT AND SUTURE OF SECONDARY INTRAOCULAR LENS Right 09/12/2019   Procedure: PLACEMENT AND SUTURE OF SECONDARY INTRAOCULAR LENS;  Surgeon: [name], MD;  Location: [place];  Service: Ophthalmology;  Laterality: Right;   PROSTATE BIOPSY  03-17-2017   dr Pilar Jarvis office   RADIOACTIVE SEED IMPLANT N/A 07/19/2017   Procedure: RADIOACTIVE SEED IMPLANT/BRACHYTHERAPY IMPLANT;  Surgeon: Nickie Retort, MD;  Location: [facility];  Service: Urology;  Laterality: N/A;  69 seeds implanted   REMOVAL RETAINED LENS Right 09/12/2019   Procedure: REMOVAL RETAINED LENS;  Surgeon: [name], MD;  Location: [place];  Service: Ophthalmology;  Laterality: Right;   RETINAL DETACHMENT SURGERY Right 06/13/2019   Dr. Coralyn Pear   SKIN GRAFT     face   SPACE OAR INSTILLATION N/A 07/19/2017   Procedure: SPACE OAR INSTILLATION;  Surgeon: Nickie Retort, MD;  Location[facility];  Service: Urology;  Laterality: N/A;   TEE WITHOUT CARDIOVERSION  03/20/2012   Procedure: TRANSESOPHAGEAL ECHOCARDIOGRAM (TEE);  Surgeon: Laverda Page, MD;  [facility];  Service: Cardiovascular;  Laterality: N/A;  normal LV; normal EF; normal RV; normal LA w/ left atrial appendage very small, normal function, interatrial septum intact without defect; normal RA; trace MR,TR, & PI; mild AV calcification and senile degeneration w/ mild  stenosis, AVA 1.7cm^2;;    TEE WITHOUT CARDIOVERSION N/A 05/30/2018   Procedure: TRANSESOPHAGEAL ECHOCARDIOGRAM (TEE);  Surgeon: [name], MD;  Location: [place];  Service: Open Heart Surgery;  Laterality: N/A;   TOTAL HIP ARTHROPLASTY Right 10/23/2017   Procedure: TOTAL HIP ARTHROPLASTY ANTERIOR APPROACH;  Surgeon: Frederik Pear, MD;  Location: [place];  Service: Orthopedics;  Laterality: Right;   TRANSTHORACIC ECHOCARDIOGRAM  01-11-2017   dr [name]  (per echo note, no significant change in seveity of AS, no other diagnostic change)   moderate concentric LVH, ef 37%, grade 1 diastolic dysfunction/  moderate LAE/  mild , grade 1 AR w/ moderate AV calcification, mild to moderate restricted AV leaflets w/ moderate AS, AVA 1.16cm^2, peak grandient 67mHg, mean grandient 379mg/  trace MR, mild calcification MV annulus , mild MV leaflet calcification, mild MVS, peak grandient 4.30m70m, mean grandient 2.7mm24m trace TR    FAMILY HISTORY Family History  Problem Relation Age of Onset   Stroke Mother 80  11ypertension Mother    Hypertension Father    Heart disease Father    Heart attack Father 70  61eart murmur Father    Hypertension Sister    Lung disease Brother    Colon cancer Neg Hx    Stomach cancer Neg Hx    Rectal cancer Neg Hx    Pancreatic cancer Neg Hx     SOCIAL HISTORY Social History   Tobacco Use   Smoking status: Former    Packs/day: 0.25    Years: 1.00    Pack years: 0.25    Types: Cigarettes    Quit date: 02/04/1967    Years since quitting: 54.4   Smokeless tobacco: Never   Tobacco comments:    smoked 1 q2-3 days  Vaping Use   Vaping Use: Never used  Substance Use Topics   Alcohol use: Yes    Comment: occasional beer   Drug use: No       OPHTHALMIC EXAM:  Base Eye Exam     Visual Acuity (Snellen - Linear)       Right Left   Dist [place] 20/80 20/20   Dist ph [place] 20/40   Patient didn't bring glasses.        Tonometry (Tonopen, 2:14 PM)       Right Left    Pressure 21 14         Pupils       Dark Light Shape React APD   Right 4 3 Round Brisk None   Left 4 3 Round Brisk None         Visual Fields (Counting fingers)       Left Right    Full Full         Extraocular Movement       Right Left    Full, Ortho Full, Ortho         Neuro/Psych     Oriented x3: Yes   Mood/Affect: Normal         Dilation     Both eyes: 1.0% Mydriacyl, 2.5% Phenylephrine @ 2:13 PM           Slit Lamp and Fundus Exam     Slit Lamp Exam       Right Left   Lids/Lashes mild Meibomian gland dysfunction, mild Telangiectasia Dermatochalasis - upper lid, Meibomian gland dysfunction, Edema, Erythema lower lid, positive concretion left palp conj   Conjunctiva/Sclera White and quiet, residual STK ST quad -- basically gone Temporal Pinguecula   Cornea well healed superior corneal wound, trace Punctate epithelial erosions, Arcus, mild tear film debris, fine endo pigment 1+ Punctate epithelial erosions, trace endo pigment, Debris in tear film   Anterior Chamber Deep and quiet Deep and quiet   Iris Round and dilated to 6mm,32mattered Transillumination defects 360, +  iridodenesis, +pigment deposition, +atrophy Round and dilated   Lens Sutured Akreos IOL well centered  3 piece Posterior chamber intraocular in good position, trace Posterior capsular opacification   Anterior Vitreous post vitrectomy, trace pigment Vitreous syneresis, Posterior vitreous detachment         Fundus Exam       Right Left   Disc 1+Pallor, Sharp rim 2+Pallor, Sharp rim, temporal PPP, mild tilt   C/D Ratio 0.3 0.5   Macula Flat, Blunted foveal reflex, mild RPE mottling and clumping, persistent central cystic changes - slightly improved, +pigment deposition on retinal surface, scattered MA, mild ERM Flat, Blunted foveal reflex, Retinal pigment epithelial mottling and clumping, No heme or edema   Vessels Mild Vascular attenuation, mild Tortuousity, mild A/V crossing  changes Vascular attenuation, mild Tortuous   Periphery attached, good 360 laser in place; ORIGINALLY: bullous superior retinal detachment from 1000-0130, another more shallow detachment lobe from 130-400, lattice and micro tears ST quadrant; Old retinal tear at 0900 with surrounding laser Attached, peripheral laser scars at 1030, No new RT/RD           Refraction     Wearing Rx       Sphere Cylinder Axis Add   Right -1.25 +1.50 025 +2.75   Left -1.00 +1.75 180 +2.75           IMAGING AND PROCEDURES  Imaging and Procedures for _0 @  OCT, Retina - OU - Both Eyes       Right Eye Quality was good. Central Foveal Thickness: 290. Progression has improved. Findings include normal foveal contour, no SRF, intraretinal fluid (Interval improvement in cystic changes nasal fovea, trace ERM).   Left Eye Quality was good. Central Foveal Thickness: 266. Progression has been stable. Findings include normal foveal contour, no IRF, no SRF.   Notes *Images captured and stored on drive  Diagnosis / Impression:  OD: Interval improvement in cystic changes nasal fovea, trace ERM OS: NFP, no IRF/SRF   Clinical management:  See below  Abbreviations: NFP - Normal foveal profile. CME - cystoid macular edema. PED - pigment epithelial detachment. IRF - intraretinal fluid. SRF - subretinal fluid. EZ - ellipsoid zone. ERM - epiretinal membrane. ORA - outer retinal atrophy. ORT - outer retinal tubulation. SRHM - subretinal hyper-reflective material             ASSESSMENT/PLAN:    ICD-10-CM   1. Dislocation of intraocular lens, sequela  T85.22XS     2. Retinal edema  H35.81 OCT, Retina - OU - Both Eyes    3. Aphakia of right eye  H27.01     4. Right retinal detachment  H33.21     5. 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 History of repair of retinal tear by laser photocoagulation  Z98.890     7. Diabetes mellitus type 2 without retinopathy ([place])  E11.9     8.  Essential hypertension  I10     9. Hypertensive retinopathy of both eyes  H35.033     10. Pseudophakia of both eyes  Z96.1     11. Cystoid macular edema of right eye  H35.351       1,2. Dislocated IOL OD  - pt with history of partially dislocated 3-piece IOL -- spontaneously dislocated completely into vitreous cavity on 1.23.21  - s/p 25g PPV w/ IOL explantation and secondary sutured IOL OD -- Akreos AO60 lens, 17.5D -- 02.11.2021             -  IOL in good position  - 2 superior nylon sutures removed at slit lamp 03.12.21 -- last nylon suture removed at slit lamp, 04.30.21  - corneal edema resolved; PEE improved             - s/p STK OD #1 (07.09.21), #2 (01.26.22), #3 (08.30.22) for CME  - BCVA 20/40  - OCT shows interval improvement in IRF/CME (see #11 below)  - IOP 21  - ATs QID OU              - f/u 6 wks --DFE/OCT   3-5. Rhegmatogenous retinal detachment, right eye  - bullous, superior, mac off detachment, onset of foveal involvement 11.11.20 by history  - Bi-lobed superior detachment, superior lobe spanning 1000-0130, nasal lobe 0130-0400  - lattice degeneration with microtears noted at 1030  - s/p PPV/PFC/EL/FAX/14% C3F8 OD, 11.12.20             - intraop: HST at 2 oclock was found; also detachment had progressed to subtotal detachment spanning 9 oclock to 6 oclock (going in clockwise direction); also severe zonular insufficiency with IOL very mobile throughout case -- did not dislocate completely during the case or immediately post op  - doing well             - retina attached and in good position--good laser in place  6. History of retinal defects s/p laser retinopexy OU with Dr. Zigmund Daniel in 2013  - OD laser at 0900  - OS laser at 1030  - stable  7. Diabetes mellitus, type 2 without retinopathy  - A1c was 6.2 in August 2022  - The incidence, risk factors for progression, natural history and treatment options for diabetic retinopathy  were discussed with patient.    -  The need for close monitoring of blood glucose, blood pressure, and serum lipids, avoiding cigarette or any type of tobacco, and the need for long term follow up was also discussed with patient  8,9. Hypertensive retinopathy OU  - discussed importance of tight BP control  - monitor  10. Pseudophakia OU  - s/p CE/IOL OU (Dr. [name])  - 3 piece IOL OD was completely displaced into vitreous -- now with sutured Akreos IOL in good position OD -- see above  - OS 3-piece IOL in good position  - monitor  11. CME OD  - post op edema  - s/p STK OD #1 07.09.21, #2 (01.26.22), #3 (08.24.22)  - today, interval improvement in cystic changes nasal fovea -- 11 wks post STK  - cont PF QID OD and Prolensa QID OD  - f/u 6 wks DFE, OCT  Ophthalmic Meds Ordered this visit:  No orders of the defined types were placed in this encounter.    Return in about 6 weeks (around 07/28/2021) for f/u CME OD, DFE, OCT.  There are no Patient Instructions on file for this visit.  This document serves as a record of services personally performed by Gardiner Sleeper, MD, PhD. It was created on their behalf by Orvan Falconer, an ophthalmic technician. The creation of this record is the provider's dictation and/or activities during the visit.    Electronically signed by: Orvan Falconer, OA, 06/17/21  11:42 PM  This document serves as a record of services personally performed by Gardiner Sleeper, MD, PhD. It was created on their behalf by San Jetty. Owens Shark, OA an ophthalmic technician. The creation of this record is the provider's dictation and/or activities during the visit.  Electronically signed by: San Jetty. Owens Shark, [place] 11.16.2022 11:42 PM   Gardiner Sleeper, M.D., Ph.D. Diseases & Surgery of the Retina and Vitreous [facility]  I have reviewed the above documentation for accuracy and completeness, and I agree with the above. Gardiner Sleeper, M.D., Ph.D. 06/17/21 11:42  PM   Abbreviations: M myopia (nearsighted); A astigmatism; H hyperopia (farsighted); P presbyopia; Mrx spectacle prescription;  CTL contact lenses; OD right eye; OS left eye; OU both eyes  XT exotropia; ET esotropia; PEK punctate epithelial keratitis; PEE punctate epithelial erosions; DES dry eye syndrome; MGD meibomian gland dysfunction; ATs artificial tears; PFAT's preservative free artificial tears; [place] nuclear sclerotic cataract; PSC posterior subcapsular cataract; ERM epi-retinal membrane; PVD posterior vitreous detachment; RD retinal detachment; DM diabetes mellitus; DR diabetic retinopathy; NPDR non-proliferative diabetic retinopathy; PDR proliferative diabetic retinopathy; CSME clinically significant macular edema; DME diabetic macular edema; dbh dot blot hemorrhages; CWS cotton wool spot; POAG primary open angle glaucoma; C/D cup-to-disc ratio; HVF humphrey visual field; GVF goldmann visual field; OCT optical coherence tomography; IOP intraocular pressure; BRVO Branch retinal vein occlusion; CRVO central retinal vein occlusion; CRAO central retinal artery occlusion; BRAO branch retinal artery occlusion; RT retinal tear; SB scleral buckle; PPV pars plana vitrectomy; VH Vitreous hemorrhage; PRP panretinal laser photocoagulation; IVK intravitreal kenalog; VMT vitreomacular traction; MH Macular hole;  NVD neovascularization of the disc; NVE neovascularization elsewhere; AREDS age related eye disease study; ARMD age related macular degeneration; POAG primary open angle glaucoma; EBMD epithelial/anterior basement membrane dystrophy; ACIOL anterior chamber intraocular lens; IOL intraocular lens; PCIOL posterior chamber intraocular lens; Phaco/IOL phacoemulsification with intraocular lens placement; [place] photorefractive keratectomy; LASIK laser assisted in situ keratomileusis; HTN hypertension; DM diabetes mellitus; COPD chronic obstructive pulmonary disease

## 2021-06-16 ENCOUNTER — Other Ambulatory Visit: Payer: Self-pay

## 2021-06-16 ENCOUNTER — Encounter (INDEPENDENT_AMBULATORY_CARE_PROVIDER_SITE_OTHER): Payer: Self-pay | Admitting: Ophthalmology

## 2021-06-16 ENCOUNTER — Ambulatory Visit (INDEPENDENT_AMBULATORY_CARE_PROVIDER_SITE_OTHER): Payer: Medicare Other | Admitting: Ophthalmology

## 2021-06-16 DIAGNOSIS — H35351 Cystoid macular degeneration, right eye: Secondary | ICD-10-CM

## 2021-06-16 DIAGNOSIS — H3581 Retinal edema: Secondary | ICD-10-CM

## 2021-06-16 DIAGNOSIS — H35411 Lattice degeneration of retina, right eye: Secondary | ICD-10-CM | POA: Diagnosis not present

## 2021-06-16 DIAGNOSIS — H2701 Aphakia, right eye: Secondary | ICD-10-CM

## 2021-06-16 DIAGNOSIS — Z9889 Other specified postprocedural states: Secondary | ICD-10-CM

## 2021-06-16 DIAGNOSIS — H3321 Serous retinal detachment, right eye: Secondary | ICD-10-CM

## 2021-06-16 DIAGNOSIS — T8522XS Displacement of intraocular lens, sequela: Secondary | ICD-10-CM

## 2021-06-16 DIAGNOSIS — I10 Essential (primary) hypertension: Secondary | ICD-10-CM

## 2021-06-16 DIAGNOSIS — Z961 Presence of intraocular lens: Secondary | ICD-10-CM

## 2021-06-16 DIAGNOSIS — H35033 Hypertensive retinopathy, bilateral: Secondary | ICD-10-CM | POA: Diagnosis not present

## 2021-06-16 DIAGNOSIS — E119 Type 2 diabetes mellitus without complications: Secondary | ICD-10-CM

## 2021-06-17 ENCOUNTER — Encounter (INDEPENDENT_AMBULATORY_CARE_PROVIDER_SITE_OTHER): Payer: Self-pay | Admitting: Ophthalmology

## 2021-07-06 ENCOUNTER — Telehealth: Payer: Self-pay | Admitting: Oncology

## 2021-07-06 NOTE — Telephone Encounter (Signed)
Called patient regarding upcoming appointments, left a voicemail. 

## 2021-07-09 ENCOUNTER — Inpatient Hospital Stay: Payer: Medicare Other

## 2021-07-09 ENCOUNTER — Inpatient Hospital Stay: Payer: Medicare Other | Admitting: Oncology

## 2021-07-13 ENCOUNTER — Other Ambulatory Visit: Payer: Self-pay

## 2021-07-13 ENCOUNTER — Ambulatory Visit
Admission: RE | Admit: 2021-07-13 | Discharge: 2021-07-13 | Disposition: A | Discharge status: Home / Self Care | Payer: Medicare Other | Source: Ambulatory Visit | Attending: Pulmonary Disease | Admitting: Pulmonary Disease

## 2021-07-13 DIAGNOSIS — J984 Other disorders of lung: Secondary | ICD-10-CM

## 2021-07-13 DIAGNOSIS — J849 Interstitial pulmonary disease, unspecified: Secondary | ICD-10-CM

## 2021-07-13 DIAGNOSIS — R918 Other nonspecific abnormal finding of lung field: Secondary | ICD-10-CM

## 2021-07-21 ENCOUNTER — Ambulatory Visit (INDEPENDENT_AMBULATORY_CARE_PROVIDER_SITE_OTHER): Payer: Medicare Other | Admitting: Pulmonary Disease

## 2021-07-21 ENCOUNTER — Encounter: Payer: Self-pay | Admitting: Pulmonary Disease

## 2021-07-21 ENCOUNTER — Other Ambulatory Visit: Payer: Self-pay

## 2021-07-21 VITALS — BP 132/68 | HR 63 | Temp 97.8°F | Ht 68.0 in | Wt 209.6 lb

## 2021-07-21 DIAGNOSIS — J849 Interstitial pulmonary disease, unspecified: Secondary | ICD-10-CM

## 2021-07-21 DIAGNOSIS — Z9889 Other specified postprocedural states: Secondary | ICD-10-CM

## 2021-07-21 DIAGNOSIS — Z87891 Personal history of nicotine dependence: Secondary | ICD-10-CM | POA: Diagnosis not present

## 2021-07-21 DIAGNOSIS — R918 Other nonspecific abnormal finding of lung field: Secondary | ICD-10-CM | POA: Diagnosis not present

## 2021-07-21 DIAGNOSIS — Z951 Presence of aortocoronary bypass graft: Secondary | ICD-10-CM

## 2021-07-21 DIAGNOSIS — Z8679 Personal history of other diseases of the circulatory system: Secondary | ICD-10-CM

## 2021-07-21 DIAGNOSIS — J984 Other disorders of lung: Secondary | ICD-10-CM

## 2021-07-21 DIAGNOSIS — R942 Abnormal results of pulmonary function studies: Secondary | ICD-10-CM

## 2021-07-21 DIAGNOSIS — Z953 Presence of xenogenic heart valve: Secondary | ICD-10-CM

## 2021-07-21 NOTE — Patient Instructions (Signed)
Thank you for visiting Dr. Valeta Harms at [facility]. Today we recommend the following:  ILD Questionaire packet  PFTS prior to appt with MR.  Return Next available appt New Consult with Dr. Chase Caller.    Please do your part to reduce the spread of COVID-19.

## 2021-07-21 NOTE — Progress Notes (Signed)
Synopsis: Referred in December 2021 for groundglass opacity by [name], MD  Subjective:   PATIENT ID: Cody Phelps GENDER: male DOB: March 11, 1941, MRN: [id number]  Chief Complaint  Patient presents with   Follow-up    Follow up on CT scan    This is a 80 year old gentleman with a past medical history of MI, chronic systolic and diastolic heart failure, prior CABG plus valve in 2019 by Dr. Roxy Manns.  Hypertension, GERD, diabetes.  Patient had a recent CT scan of the chest on 06/11/2020 which revealed a 2.8 x 2.5 groundglass opacity within the left lower lobe concerning for a potential underlying primary lung cancer such as a lipidic adenocarcinoma.  Patient was seen by cardiothoracic surgery, Dr. Kipp Brood and referred to [place] for image follow-up and consideration for bronchoscopy if needed.  Patient denies weight loss, shortness of breath neurologic symptoms or hemoptysis.  He still occasionally smokes a pipe.    09/21/2020: Here today for follow-up regarding the CT scan chest.  Recent CT chest reveals resolution of previously rounded groundglass opacity that was seen in December.  However does show lower lobe areas of septal thickening which may be related to scarring from previous surgeries.  This in conjunction with his prior pulmonary function test.  Patient's PFTs were completed in August 2019 which revealed a reduced DLCO of 39% predicted a normal ratio but also a reduced FEV1 and FVC.  Due to the abnormalities consistent with significant restriction present we will have a HRCT complete for next follow-up of nodule.  We reviewed this today in the office with patient.  From a respiratory standpoint he has no complaints today.  07/21/2021: Here today for follow-up regarding HRCT imaging of the chest.  Patient had CT imagingFor follow-up of basilar parenchymal lung changes concerning for interstitial lung disease.  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  Of note he has had progressive shortness of breath with exertion.   Past Medical History:  Diagnosis Date   Acute anterior wall MI ([place]) 03/01/2018   Acute combined systolic and diastolic heart failure ([place]) 03/15/2018   Burn    Chronic diastolic CHF (congestive heart failure) ([place]) 09/21/2018   Defect, retina, with detachment    Dementia ([place])    Diverticulosis    ED (erectile dysfunction)    First degree heart block    GERD (gastroesophageal reflux disease)    Heart murmur    History of blood transfusion    with open heart surgery   Hypertension    cardiologsit-  dr [name]   Hypertensive retinopathy    OU   Iron deficiency anemia    Mixed hyperlipidemia    Morbid obesity ([place])    Myocardial infarction ([place])    02/28/18   OA (osteoarthritis)    right hip   OSA on CPAP    followed by dr dohmeier, uses CPAP   PAF (paroxysmal atrial fibrillation) ([place])    a. on Eliquis   Prostate cancer ([place])    dx 03-17-2017 (bx)  Stage T2a, Gleason 4+4, PSA  4.43, vol 32.32--  s/p radiatctive prostate seed implants 07-10-2017 then IMRT and ADT   Retinal detachment    Rheg. 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   LIMA to Diagonal Branch   S/P Maze operation for atrial fibrillation 05/30/2018   Complete bilateral atrial lesion set using bipolar radiofrequency and cryothermy ablation with clipping of LA appendage   Scoliosis    Trigger finger    Type 2 diabetes mellitus ([place])    Wears partial dentures    upper and lower     Family History  Problem Relation Age of Onset   Stroke Mother 67   Hypertension Mother    Hypertension Father    Heart disease Father    Heart attack Father 12   Heart  murmur Father    Hypertension Sister    Lung disease Brother    Colon cancer Neg Hx    Stomach cancer Neg Hx    Rectal cancer Neg Hx    Pancreatic cancer Neg Hx      Past Surgical History:  Procedure Laterality Date   AORTIC VALVE REPLACEMENT N/A 05/30/2018   Procedure: AORTIC VALVE REPLACEMENT (AVR) using Inspiris Valve, Size 23;  Surgeon: [name], MD;  Location: [place];  Service: Open Heart Surgery;  Laterality: N/A;   APPENDECTOMY  1988   BILATERAL TOTAL ETHMOIDECTOMY AND SPHENOIDECTOMY  01-14-2009    dr Redmond Baseman   [facility]   CARDIOVERSION N/A 08/10/2018   Procedure: CARDIOVERSION;  Surgeon: Adrian Prows, MD;  Location: [facility];  Service: Cardiovascular;  Laterality: N/A;   CARPAL TUNNEL RELEASE Bilateral 2013   CATARACT EXTRACTION Bilateral    Dr. [name]   CATARACT EXTRACTION W/ INTRAOCULAR LENS  IMPLANT, BILATERAL  date?   CORONARY ARTERY BYPASS GRAFT N/A 05/30/2018   CORONARY BALLOON ANGIOPLASTY N/A 03/01/2018   Procedure: CORONARY BALLOON ANGIOPLASTY;  Surgeon: Adrian Prows, MD;  Location: [place] [facility];  Service: Cardiovascular;  Laterality: N/A;   CORONARY/GRAFT ACUTE MI REVASCULARIZATION N/A 03/01/2018   Procedure: Coronary/Graft Acute MI Revascularization;  Surgeon: Adrian Prows, MD;  Location: [place] [facility];  Service: Cardiovascular;  Laterality: N/A;   CYSTOSCOPY  07/19/2017   Procedure: CYSTOSCOPY;  Surgeon: Nickie Retort, MD;  Location: [facility];  Service: Urology;;  No seeds found in [place] Bilateral    Cat Sx OU.  Dislocated IOL sx OD.  Rheg. RD repair OS.   EYE SURGERY     GAS/FLUID EXCHANGE Right 06/13/2019   Procedure: GAS/FLUID EXCHANGE;  Surgeon: [name], MD;  Location: [facility];  Service: Ophthalmology;  Laterality: Right;   LAMINECTOMY WITH POSTERIOR LATERAL ARTHRODESIS LEVEL 2 N/A 02/12/2014   Procedure: LUMBAR TWO-THREE,LUIMBAR THREE-FOUR LAMINECTOMY/FORAMINOTOMY;POSSIBLE POSTEROLATERAL ARTHRODESIS WITH  AUTOGRAFT;  Surgeon: [name], MD;  Location: [facility];  Service: Neurosurgery;  Laterality: N/A;   LEFT HEART CATH AND CORONARY ANGIOGRAPHY N/A 03/01/2018   Procedure: LEFT HEART CATH AND CORONARY ANGIOGRAPHY;  Surgeon: Adrian Prows, MD;  Location: [place] [facility];  Service: Cardiovascular;  Laterality: N/A;   MAZE N/A 05/30/2018   Procedure: MAZE;  Surgeon: [name], MD;  Location: [place];  Service: Open Heart Surgery;  Laterality: N/A;   ORIF RIGHT ANKLE FX'S  05/05/2001   retained hardware   PARS PLANA VITRECTOMY Right 06/13/2019   Procedure: PARS PLANA VITRECTOMY WITH 25 GAUGE WITH LASER AND GAS;  Surgeon: [name], MD;  Location: [facility];  Service: Ophthalmology;  Laterality: Right;   PARS PLANA VITRECTOMY Right 09/12/2019   Procedure: Pars Plana Vitrectomy With 25 Gauge;  Surgeon:  [name], MD;  Location: [place];  Service: Ophthalmology;  Laterality: Right;   PERFLUORONE INJECTION Right 06/13/2019   Procedure: PERFLUORONE INJECTION;  Surgeon: [name], MD;  Location: [place];  Service: Ophthalmology;  Laterality: Right;   PHOTOCOAGULATION WITH LASER Right 06/13/2019   Procedure: PHOTOCOAGULATION WITH LASER;  Surgeon: [name], MD;  Location: [place];  Service: Ophthalmology;  Laterality: Right;   PLACEMENT AND SUTURE OF SECONDARY INTRAOCULAR LENS Right 09/12/2019   Procedure: PLACEMENT AND SUTURE OF SECONDARY INTRAOCULAR LENS;  Surgeon: [name], MD;  Location: [place];  Service: Ophthalmology;  Laterality: Right;   PROSTATE BIOPSY  03-17-2017   dr Pilar Jarvis office   RADIOACTIVE SEED IMPLANT N/A 07/19/2017   Procedure: RADIOACTIVE SEED IMPLANT/BRACHYTHERAPY IMPLANT;  Surgeon: Nickie Retort, MD;  Location[facility];  Service: Urology;  Laterality: N/A;  69 seeds implanted   REMOVAL RETAINED LENS Right 09/12/2019   Procedure: REMOVAL RETAINED LENS;  Surgeon: [name], MD;  Location: [place];  Service: Ophthalmology;  Laterality: Right;    RETINAL DETACHMENT SURGERY Right 06/13/2019   Dr. Coralyn Pear   SKIN GRAFT     face   SPACE OAR INSTILLATION N/A 07/19/2017   Procedure: SPACE OAR INSTILLATION;  Surgeon: Nickie Retort, MD;  Location[facility];  Service: Urology;  Laterality: N/A;   TEE WITHOUT CARDIOVERSION  03/20/2012   Procedure: TRANSESOPHAGEAL ECHOCARDIOGRAM (TEE);  Surgeon: Laverda Page, MD;  Location: [facility];  Service: Cardiovascular;  Laterality: N/A;  normal LV; normal EF; normal RV; normal LA w/ left atrial appendage very small, normal function, interatrial septum intact without defect; normal RA; trace MR,TR, & PI; mild AV calcification and senile degeneration w/ mild stenosis, AVA 1.7cm^2;;    TEE WITHOUT CARDIOVERSION N/A 05/30/2018   Procedure: TRANSESOPHAGEAL ECHOCARDIOGRAM (TEE);  Surgeon: [name], MD;  Location: [place];  Service: Open Heart Surgery;  Laterality: N/A;   TOTAL HIP ARTHROPLASTY Right 10/23/2017   Procedure: TOTAL HIP ARTHROPLASTY ANTERIOR APPROACH;  Surgeon: Frederik Pear, MD;  Location: [place];  Service: Orthopedics;  Laterality: Right;   TRANSTHORACIC ECHOCARDIOGRAM  01-11-2017   dr [name]  (per echo note, no significant change in seveity of AS, no other diagnostic change)   moderate concentric LVH, ef 16%, grade 1 diastolic dysfunction/  moderate LAE/  mild , grade 1 AR w/ moderate AV calcification, mild to moderate restricted AV leaflets w/ moderate AS, AVA 1.16cm^2, peak grandient 47mmHg, mean grandient 4mmHg/  trace MR, mild calcification MV annulus , mild MV leaflet calcification, mild MVS, peak grandient 4.26mmHg, mean grandient 2.74mmHg/ trace TR    Social History   Socioeconomic History   Marital status: Married    Spouse name: Butch Penny   Number of children: 2   Years of education: 14   Highest education level: Not on file  Occupational History   Not on file  Tobacco Use   Smoking status: Former    Packs/day: 0.25    Years: 1.00    Pack years: 0.25     Types: Cigarettes    Quit date: 02/04/1967    Years since quitting: 54.4   Smokeless tobacco: Never   Tobacco comments:    smoked 1 q2-3 days  Vaping Use   Vaping Use: Never used  Substance and Sexual Activity   Alcohol use: Yes    Comment: occasional beer   Drug use: No   Sexual activity: Yes  Other Topics Concern   Not on file  Social History Narrative   Patient is married Butch Penny).   Patient has two children.   Patient is retired.   Patient has a college education.   Patient is right-handed.   Patient drinks two cups of coffee per day and 2-3 cups of Diet soda daily and limited tea.         Social Determinants of Health   Financial Resource Strain: Not on file  Food Insecurity: Not on file  Transportation Needs: Not on file  Physical Activity: Not on file  Stress: Not on file  Social Connections: Not on file  Intimate Partner Violence: Not on file     No Known Allergies   Outpatient Medications Prior to Visit  Medication Sig Dispense Refill   atorvastatin (LIPITOR) 80 MG tablet TAKE 1 TABLET BY MOUTH  DAILY 90 tablet 3   Bromfenac Sodium (PROLENSA) 0.07 % SOLN Place 1 drop into the right eye 4 (four) times daily. 3 mL 3   colesevelam (WELCHOL) 625 MG tablet Take 625 mg by mouth 3 (three) times daily.      donepezil (ARICEPT) 10 MG tablet Take 10 mg by mouth every morning.      esomeprazole (NEXIUM) 40 MG capsule Take 40 mg by mouth daily.      ferrous sulfate 325 (65 FE) MG tablet TAKE 1 TABLET BY MOUTH EVERY DAY WITH BREAKFAST 90 tablet 3   gabapentin (NEURONTIN) 300 MG capsule      insulin glargine (LANTUS) 100 UNIT/ML injection Inject 20-40 Units into the skin at bedtime as needed (BGL below 140-150 take 20 units, if 151 or higher take 40 units).     memantine (NAMENDA) 10 MG tablet Take 1 tablet (10 mg total) by mouth 2 (two) times daily. 180 tablet 1   metoprolol tartrate (LOPRESSOR) 25 MG tablet TAKE 1 TABLET BY MOUTH  DAILY (Patient taking differently: Take  12.5 mg by mouth 2 (two) times daily.) 90 tablet 3   mirabegron ER (MYRBETRIQ) 50 MG TB24 tablet Take 50 mg by mouth at bedtime.      omega-3 acid ethyl esters (LOVAZA) 1 G capsule Take 2 g by mouth daily.     prednisoLONE acetate (PRED FORTE) 1 % ophthalmic suspension Place 1 drop into the right eye 4 (four) times daily. 15 mL 0   rOPINIRole (REQUIP) 2 MG tablet Take 2 mg by mouth 3 (three) times daily as needed (RLS).      spironolactone (ALDACTONE) 25 MG tablet TAKE ONE-HALF TABLET BY  MOUTH DAILY 45 tablet 3   tamsulosin (FLOMAX) 0.4 MG CAPS capsule Take 0.4 mg by mouth daily after supper.      torsemide (DEMADEX) 20 MG tablet Take 1 tablet (20 mg total) by mouth as directed. 2 times a week on Tuesday and Thursday.  Take additional dose as directed with weight gain of 2 Lbs 90 tablet 1   valsartan-hydrochlorothiazide (DIOVAN-HCT) 80-12.5 MG tablet TAKE 1 TABLET BY MOUTH IN  THE MORNING 90 tablet 3   vitamin B-12 (CYANOCOBALAMIN) 1000 MCG tablet Take 1,000 mcg by mouth 2 (two) times daily.     XARELTO 15 MG TABS tablet TAKE 1 TABLET BY MOUTH IN  THE EVENING AFTER DINNER 90 tablet 3   amiodarone (PACERONE) 200 MG tablet Take 200 mg by mouth daily. (Patient not taking: Reported on 06/16/2021)     nitroGLYCERIN (NITROSTAT) 0.4 MG SL tablet Place 1 tablet (0.4 mg total) under the tongue every 5 (five) minutes as needed for up to  25 days for chest pain. 25 tablet 3   No facility-administered medications prior to visit.    Review of Systems  Constitutional:  Negative for chills, fever, malaise/fatigue and weight loss.  HENT:  Negative for hearing loss, sore throat and tinnitus.   Eyes:  Negative for blurred vision and double vision.  Respiratory:  Positive for shortness of breath. Negative for cough, hemoptysis, sputum production, wheezing and stridor.   Cardiovascular:  Negative for chest pain, palpitations, orthopnea, leg swelling and PND.  Gastrointestinal:  Negative for abdominal pain,  constipation, diarrhea, heartburn, nausea and vomiting.  Genitourinary:  Negative for dysuria, hematuria and urgency.  Musculoskeletal:  Negative for joint pain and myalgias.  Skin:  Negative for itching and rash.  Neurological:  Negative for dizziness, tingling, weakness and headaches.  Endo/Heme/Allergies:  Negative for environmental allergies. Does not bruise/bleed easily.  Psychiatric/Behavioral:  Negative for depression. The patient is not nervous/anxious and does not have insomnia.   All other systems reviewed and are negative.   Objective:  Physical Exam Vitals reviewed.  Constitutional:      General: He is not in acute distress.    Appearance: He is well-developed.  HENT:     Head: Normocephalic and atraumatic.  Eyes:     General: No scleral icterus.    Pupils: Pupils are equal, round, and reactive to light.  Neck:     Vascular: No JVD.     Trachea: No tracheal deviation.  Cardiovascular:     Rate and Rhythm: Normal rate and regular rhythm.     Heart sounds: Normal heart sounds. No murmur heard. Pulmonary:     Effort: Pulmonary effort is normal. No tachypnea, accessory muscle usage or respiratory distress.     Breath sounds: No stridor. No wheezing, rhonchi or rales.     Comments: Bibasilar crackles, fine Abdominal:     General: Bowel sounds are normal. There is no distension.     Palpations: Abdomen is soft.     Tenderness: There is no abdominal tenderness.  Musculoskeletal:        General: No tenderness.     Cervical back: Neck supple.  Lymphadenopathy:     Cervical: No cervical adenopathy.  Skin:    General: Skin is warm and dry.     Capillary Refill: Capillary refill takes less than 2 seconds.     Findings: No rash.  Neurological:     Mental Status: He is alert and oriented to person, place, and time.  Psychiatric:        Behavior: Behavior normal.     Vitals:   07/21/21 1435  BP: 132/68  Pulse: 63  Temp: 97.8 F (36.6 C)  TempSrc: Oral  SpO2: 97%   Weight: 209 lb 9.6 oz (95.1 kg)  Height: 5\' 8"  (1.727 m)   97% on RA BMI Readings from Last 3 Encounters:  07/21/21 31.87 kg/m  04/29/21 31.96 kg/m  03/10/21 32.28 kg/m   Wt Readings from Last 3 Encounters:  07/21/21 209 lb 9.6 oz (95.1 kg)  04/29/21 210 lb 3.2 oz (95.3 kg)  03/10/21 212 lb 5 oz (96.3 kg)     CBC    Component Value Date/Time   WBC 6.2 12/31/2020 0926   WBC 5.6 09/12/2019 0956   RBC 3.35 (L) 12/31/2020 0926   HGB 9.5 (L) 12/31/2020 0926   HGB 10.6 (L) 09/15/2020 1154   HCT 29.2 (L) 12/31/2020 0926   HCT 32.7 (L) 09/15/2020 1154   PLT 171 12/31/2020 0926  PLT 191 09/15/2020 1154   MCV 87.2 12/31/2020 0926   MCV 88 09/15/2020 1154   MCH 28.4 12/31/2020 0926   MCHC 32.5 12/31/2020 0926   RDW 14.0 12/31/2020 0926   RDW 14.5 09/15/2020 1154   LYMPHSABS 1.0 12/31/2020 0926   MONOABS 0.7 12/31/2020 0926   EOSABS 0.5 12/31/2020 0926   BASOSABS 0.1 12/31/2020 0926     Chest Imaging:  CT chest 09/17/2020: Resolution of the focal groundglass opacity.  Small subtle areas within the left lower lobe possible underlying scarring.  No concerning mediastinal adenopathy. The patient's images have been independently reviewed by me.    HRCT imaging December 2022: Progressive interstitial changes since February CT imaging concerning for ILD. The patient's images have been independently reviewed by me.     Pulmonary Functions Testing Results: PFT Results Latest Ref Rng & Units 03/19/2018  FVC-Pre L 1.73  FVC-Predicted Pre % 42  FVC-Post L 1.65  FVC-Predicted Post % 40  Pre FEV1/FVC % % 80  Post FEV1/FCV % % 87  FEV1-Pre L 1.38  FEV1-Predicted Pre % 47  FEV1-Post L 1.43  DLCO uncorrected ml/min/mmHg 12.22  DLCO UNC% % 39  DLCO corrected ml/min/mmHg 15.93  DLCO COR %Predicted % 51  DLVA Predicted % 102  TLC L 4.09  TLC % Predicted % 59  RV % Predicted % 90    FeNO:  Pathology:   Echocardiogram:   Heart Catheterization:     Assessment & Plan:      ICD-10-CM   1. 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 Ground glass opacity present on imaging of lung  R91.8     3. 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 Former smoker  Z87.891     5. S/P CABG x 1  Z95.1     6. S/P aortic valve replacement with bioprosthetic valve  Z95.3     7. Decreased diffusion capacity of lung  R94.2     8. S/P Maze operation for atrial fibrillation  Z98.890    Z86.79        Discussion:  This is a 80 year old gentleman, history of initially seen me for a groundglass opacity of left lower lobe which is now resolved.  However CT imaging does show some subtle septal thickening and changes in the periphery concerning for early interstitial lung disease.  He had subsequent follow-up CT imaging which also showed this.  Previous PFTs in 2019 with restrictive disease.  Plan: We will get a repeat set of pulmonary function test We will also have a consultation with Dr. Chase Caller in the interstitial lung disease clinic. I also sent him home with a ILD questionnaire.  Return to clinic in a few weeks after PFTs and to see Dr. Chase Caller.    Current Outpatient Medications:    atorvastatin (LIPITOR) 80 MG tablet, TAKE 1 TABLET BY MOUTH  DAILY, Disp: 90 tablet, Rfl: 3   Bromfenac Sodium (PROLENSA) 0.07 % SOLN, Place 1 drop into the right eye 4 (four) times daily., Disp: 3 mL, Rfl: 3   colesevelam (WELCHOL) 625 MG tablet, Take 625 mg by mouth 3 (three) times daily. , Disp: , Rfl:    donepezil (ARICEPT) 10 MG tablet, Take 10 mg by mouth every morning. , Disp: , Rfl:    esomeprazole (NEXIUM) 40 MG capsule, Take 40 mg by mouth daily. , Disp: , Rfl:    ferrous sulfate 325 (65 FE) MG tablet, TAKE 1 TABLET BY MOUTH  EVERY DAY WITH BREAKFAST, Disp: 90 tablet, Rfl: 3   gabapentin (NEURONTIN) 300 MG capsule, , Disp: , Rfl:    insulin glargine (LANTUS) 100 UNIT/ML injection, Inject 20-40 Units into the skin at bedtime as needed (BGL below 140-150 take 20 units, if  151 or higher take 40 units)., Disp: , Rfl:    memantine (NAMENDA) 10 MG tablet, Take 1 tablet (10 mg total) by mouth 2 (two) times daily., Disp: 180 tablet, Rfl: 1   metoprolol tartrate (LOPRESSOR) 25 MG tablet, TAKE 1 TABLET BY MOUTH  DAILY (Patient taking differently: Take 12.5 mg by mouth 2 (two) times daily.), Disp: 90 tablet, Rfl: 3   mirabegron ER (MYRBETRIQ) 50 MG TB24 tablet, Take 50 mg by mouth at bedtime. , Disp: , Rfl:    omega-3 acid ethyl esters (LOVAZA) 1 G capsule, Take 2 g by mouth daily., Disp: , Rfl:    prednisoLONE acetate (PRED FORTE) 1 % ophthalmic suspension, Place 1 drop into the right eye 4 (four) times daily., Disp: 15 mL, Rfl: 0   rOPINIRole (REQUIP) 2 MG tablet, Take 2 mg by mouth 3 (three) times daily as needed (RLS). , Disp: , Rfl:    spironolactone (ALDACTONE) 25 MG tablet, TAKE ONE-HALF TABLET BY  MOUTH DAILY, Disp: 45 tablet, Rfl: 3   tamsulosin (FLOMAX) 0.4 MG CAPS capsule, Take 0.4 mg by mouth daily after supper. , Disp: , Rfl:    torsemide (DEMADEX) 20 MG tablet, Take 1 tablet (20 mg total) by mouth as directed. 2 times a week on Tuesday and Thursday.  Take additional dose as directed with weight gain of 2 Lbs, Disp: 90 tablet, Rfl: 1   valsartan-hydrochlorothiazide (DIOVAN-HCT) 80-12.5 MG tablet, TAKE 1 TABLET BY MOUTH IN  THE MORNING, Disp: 90 tablet, Rfl: 3   vitamin B-12 (CYANOCOBALAMIN) 1000 MCG tablet, Take 1,000 mcg by mouth 2 (two) times daily., Disp: , Rfl:    XARELTO 15 MG TABS tablet, TAKE 1 TABLET BY MOUTH IN  THE EVENING AFTER DINNER, Disp: 90 tablet, Rfl: 3   amiodarone (PACERONE) 200 MG tablet, Take 200 mg by mouth daily. (Patient not taking: Reported on 06/16/2021), Disp: , Rfl:    nitroGLYCERIN (NITROSTAT) 0.4 MG SL tablet, Place 1 tablet (0.4 mg total) under the tongue every 5 (five) minutes as needed for up to 25 days for chest pain., Disp: 25 tablet, Rfl: 3    Garner Nash, DO [facility] 07/21/2021 2:50 PM

## 2021-07-22 NOTE — Progress Notes (Signed)
[facility] Note  07/28/2021     CHIEF COMPLAINT Patient presents for Retina Follow Up    HISTORY OF PRESENT ILLNESS: Cody Phelps is a 80 y.o. male who presents to the clinic today for:  HPI     Retina Follow Up   Patient presents with  Retinal Break/Detachment (Dislocated IOL).  In right eye.  Severity is moderate.  Duration of 6 weeks.  Since onset it is stable.  I, the attending physician,  performed the HPI with the patient and updated documentation appropriately.        Comments   Patient states slightly worse OU. No new floaters or flashes. Using Pred Forte and prolensa qid OD.       Last edited by [name], MD on 07/28/2021  5:17 PM.    Pt feels like right eye is blurry   Referring physician: [name], MD [place],  [place] [id number]  HISTORICAL INFORMATION:   Selected notes from the [facility] Referred by Dr. [name] for concern of RD OD   CURRENT MEDICATIONS: Current Outpatient Medications (Ophthalmic Drugs)  Medication Sig   brimonidine (ALPHAGAN) 0.2 % ophthalmic solution Place 1 drop into the right eye every 8 (eight) hours.   Bromfenac Sodium (PROLENSA) 0.07 % SOLN Place 1 drop into the right eye 4 (four) times daily.   dorzolamide-timolol (COSOPT) 22.3-6.8 MG/ML ophthalmic solution Place 1 drop into the right eye 2 (two) times daily.   Loteprednol Etabonate (LOTEMAX SM) 0.38 % GEL Place 1 drop into the right eye 4 (four) times daily.   prednisoLONE acetate (PRED FORTE) 1 % ophthalmic suspension Place 1 drop into the right eye 4 (four) times daily.   No current facility-administered medications for this visit. (Ophthalmic Drugs)   Current Outpatient Medications (Other)  Medication Sig   atorvastatin (LIPITOR) 80 MG tablet TAKE 1 TABLET BY MOUTH  DAILY   colesevelam (WELCHOL) 625 MG tablet Take 625 mg by mouth 3 (three) times daily.    donepezil (ARICEPT) 10 MG tablet Take 10 mg  by mouth every morning.    esomeprazole (NEXIUM) 40 MG capsule Take 40 mg by mouth daily.    ferrous sulfate 325 (65 FE) MG tablet TAKE 1 TABLET BY MOUTH EVERY DAY WITH BREAKFAST   gabapentin (NEURONTIN) 300 MG capsule    insulin glargine (LANTUS) 100 UNIT/ML injection Inject 20-40 Units into the skin at bedtime as needed (BGL below 140-150 take 20 units, if 151 or higher take 40 units).   memantine (NAMENDA) 10 MG tablet Take 1 tablet (10 mg total) by mouth 2 (two) times daily.   metoprolol tartrate (LOPRESSOR) 25 MG tablet TAKE 1 TABLET BY MOUTH  DAILY (Patient taking differently: Take 12.5 mg by mouth 2 (two) times daily.)   mirabegron ER (MYRBETRIQ) 50 MG TB24 tablet Take 50 mg by mouth at bedtime.    omega-3 acid ethyl esters (LOVAZA) 1 G capsule Take 2 g by mouth daily.   rOPINIRole (REQUIP) 2 MG tablet Take 2 mg by mouth 3 (three) times daily as needed (RLS).    spironolactone (ALDACTONE) 25 MG tablet TAKE ONE-HALF TABLET BY  MOUTH DAILY   tamsulosin (FLOMAX) 0.4 MG CAPS capsule Take 0.4 mg by mouth daily after supper.    torsemide (DEMADEX) 20 MG tablet Take 1 tablet (20 mg total) by mouth as directed. 2 times a week on Tuesday and Thursday.  Take additional dose as directed with weight  gain of 2 Lbs   valsartan-hydrochlorothiazide (DIOVAN-HCT) 80-12.5 MG tablet TAKE 1 TABLET BY MOUTH IN  THE MORNING   vitamin B-12 (CYANOCOBALAMIN) 1000 MCG tablet Take 1,000 mcg by mouth 2 (two) times daily.   XARELTO 15 MG TABS tablet TAKE 1 TABLET BY MOUTH IN  THE EVENING AFTER DINNER   amiodarone (PACERONE) 200 MG tablet Take 200 mg by mouth daily. (Patient not taking: Reported on 06/16/2021)   nitroGLYCERIN (NITROSTAT) 0.4 MG SL tablet Place 1 tablet (0.4 mg total) under the tongue every 5 (five) minutes as needed for up to 25 days for chest pain.   No current facility-administered medications for this visit. (Other)   REVIEW OF SYSTEMS: ROS   Positive for: Musculoskeletal, Endocrine,  Cardiovascular, Eyes, Respiratory Negative for: Constitutional, Gastrointestinal, Neurological, Skin, Genitourinary, HENT, Psychiatric, Allergic/Imm, Heme/Lymph Last edited by Roselee Nova D, COT on 07/28/2021  1:34 PM.     ALLERGIES No Known Allergies  PAST MEDICAL HISTORY Past Medical History:  Diagnosis Date   Acute anterior wall MI ([place]) 03/01/2018   Acute combined systolic and diastolic heart failure ([place]) 03/15/2018   Burn    Chronic diastolic CHF (congestive heart failure) ([place]) 09/21/2018   Defect, retina, with detachment    Dementia ([place])    Diverticulosis    ED (erectile dysfunction)    First degree heart block    GERD (gastroesophageal reflux disease)    Heart murmur    History of blood transfusion    with open heart surgery   Hypertension    cardiologsit-  dr [name]   Hypertensive retinopathy    OU   Iron deficiency anemia    Mixed hyperlipidemia    Morbid obesity ([place])    Myocardial infarction ([place])    02/28/18   OA (osteoarthritis)    right hip   OSA on CPAP    followed by dr dohmeier, uses CPAP   PAF (paroxysmal atrial fibrillation) ([place])    a. on Eliquis   Prostate cancer ([place])    dx 03-17-2017 (bx)  Stage T2a, Gleason 4+4, PSA  4.43, vol 32.32--  s/p radiatctive prostate seed implants 07-10-2017 then IMRT and ADT   Retinal detachment    Rheg. 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   LIMA to Diagonal Branch   S/P Maze operation for atrial fibrillation 05/30/2018   Complete bilateral atrial lesion set using bipolar radiofrequency and cryothermy ablation with clipping of LA appendage   Scoliosis    Trigger finger    Type 2 diabetes mellitus ([place])    Wears partial dentures    upper and lower   Past Surgical History:  Procedure Laterality Date   AORTIC VALVE REPLACEMENT N/A 05/30/2018    Procedure: AORTIC VALVE REPLACEMENT (AVR) using Inspiris Valve, Size 23;  Surgeon: [name], MD;  Location: [facility];  Service: Open Heart Surgery;  Laterality: N/A;   APPENDECTOMY  1988   BILATERAL TOTAL ETHMOIDECTOMY AND SPHENOIDECTOMY  01-14-2009    dr Redmond Baseman   [facility]   CARDIOVERSION N/A 08/10/2018   Procedure: CARDIOVERSION;  Surgeon: Adrian Prows, MD;  Location: [facility];  Service: Cardiovascular;  Laterality: N/A;   CARPAL TUNNEL RELEASE Bilateral 2013   CATARACT EXTRACTION Bilateral    Dr. [name]   CATARACT EXTRACTION W/ INTRAOCULAR LENS  IMPLANT, BILATERAL  date?  CORONARY ARTERY BYPASS GRAFT N/A 05/30/2018   CORONARY BALLOON ANGIOPLASTY N/A 03/01/2018   Procedure: CORONARY BALLOON ANGIOPLASTY;  Surgeon: Adrian Prows, MD;  Location: [facility];  Service: Cardiovascular;  Laterality: N/A;   CORONARY/GRAFT ACUTE MI REVASCULARIZATION N/A 03/01/2018   Procedure: Coronary/Graft Acute MI Revascularization;  Surgeon: Adrian Prows, MD;  Location: [facility];  Service: Cardiovascular;  Laterality: N/A;   CYSTOSCOPY  07/19/2017   Procedure: CYSTOSCOPY;  Surgeon: Nickie Retort, MD;  Location: [facility];  Service: Urology;;  No seeds found in [place] Bilateral    Cat Sx OU.  Dislocated IOL sx OD.  Rheg. RD repair OS.   EYE SURGERY     GAS/FLUID EXCHANGE Right 06/13/2019   Procedure: GAS/FLUID EXCHANGE;  Surgeon: [name], MD;  Location: [facility];  Service: Ophthalmology;  Laterality: Right;   LAMINECTOMY WITH POSTERIOR LATERAL ARTHRODESIS LEVEL 2 N/A 02/12/2014   Procedure: LUMBAR TWO-THREE,LUIMBAR THREE-FOUR LAMINECTOMY/FORAMINOTOMY;POSSIBLE POSTEROLATERAL ARTHRODESIS WITH AUTOGRAFT;  Surgeon: [name], MD;  Location: [facility];  Service: Neurosurgery;  Laterality: N/A;   LEFT HEART CATH AND CORONARY ANGIOGRAPHY N/A 03/01/2018   Procedure: LEFT HEART CATH AND CORONARY ANGIOGRAPHY;  Surgeon: Adrian Prows, MD;  Location: [facility];   Service: Cardiovascular;  Laterality: N/A;   MAZE N/A 05/30/2018   Procedure: MAZE;  Surgeon: [name], MD;  Location: [facility];  Service: Open Heart Surgery;  Laterality: N/A;   ORIF RIGHT ANKLE FX'S  05/05/2001   retained hardware   PARS PLANA VITRECTOMY Right 06/13/2019   Procedure: PARS PLANA VITRECTOMY WITH 25 GAUGE WITH LASER AND GAS;  Surgeon: [name], MD;  Location: [facility];  Service: Ophthalmology;  Laterality: Right;   PARS PLANA VITRECTOMY Right 09/12/2019   Procedure: Pars Plana Vitrectomy With 25 Gauge;  Surgeon: [name], MD;  Location: [facility];  Service: Ophthalmology;  Laterality: Right;   PERFLUORONE INJECTION Right 06/13/2019   Procedure: PERFLUORONE INJECTION;  Surgeon: [name], MD;  Location: [facility];  Service: Ophthalmology;  Laterality: Right;   PHOTOCOAGULATION WITH LASER Right 06/13/2019   Procedure: PHOTOCOAGULATION WITH LASER;  Surgeon: [name], MD;  Location: [facility];  Service: Ophthalmology;  Laterality: Right;   PLACEMENT AND SUTURE OF SECONDARY INTRAOCULAR LENS Right 09/12/2019   Procedure: PLACEMENT AND SUTURE OF SECONDARY INTRAOCULAR LENS;  Surgeon: [name], MD;  Location: [facility];  Service: Ophthalmology;  Laterality: Right;   PROSTATE BIOPSY  03-17-2017   dr Cody Jarvis office   RADIOACTIVE SEED IMPLANT N/A 07/19/2017   Procedure: RADIOACTIVE SEED IMPLANT/BRACHYTHERAPY IMPLANT;  Surgeon: Nickie Retort, MD;  Location: [facility];  Service: Urology;  Laterality: N/A;  69 seeds implanted   REMOVAL RETAINED LENS Right 09/12/2019   Procedure: REMOVAL RETAINED LENS;  Surgeon: [name], MD;  Location: [facility];  Service: Ophthalmology;  Laterality: Right;   RETINAL DETACHMENT SURGERY Right 06/13/2019   Dr. Coralyn Pear   SKIN GRAFT     face   SPACE OAR INSTILLATION N/A 07/19/2017   Procedure: SPACE OAR INSTILLATION;  Surgeon: Nickie Retort, MD;  Location: [facility];  Service: Urology;  Laterality: N/A;   TEE  WITHOUT CARDIOVERSION  03/20/2012   Procedure: TRANSESOPHAGEAL ECHOCARDIOGRAM (TEE);  Surgeon: Laverda Page, MD;  Location: [facility];  Service: Cardiovascular;  Laterality: N/A;  normal LV; normal EF; normal RV; normal LA w/ left atrial appendage very small, normal function, interatrial septum intact without defect;  normal RA; trace MR,TR, & PI; mild AV calcification and senile degeneration w/ mild stenosis, AVA 1.7cm^2;;    TEE WITHOUT CARDIOVERSION N/A 05/30/2018   Procedure: TRANSESOPHAGEAL ECHOCARDIOGRAM (TEE);  Surgeon: [name], MD;  Location: [facility];  Service: Open Heart Surgery;  Laterality: N/A;   TOTAL HIP ARTHROPLASTY Right 10/23/2017   Procedure: TOTAL HIP ARTHROPLASTY ANTERIOR APPROACH;  Surgeon: Frederik Pear, MD;  Location: [facility] [place];  Service: Orthopedics;  Laterality: Right;   TRANSTHORACIC ECHOCARDIOGRAM  01-11-2017   dr [name]  (per echo note, no significant change in seveity of AS, no other diagnostic change)   moderate concentric LVH, ef 61%, grade 1 diastolic dysfunction/  moderate LAE/  mild , grade 1 AR w/ moderate AV calcification, mild to moderate restricted AV leaflets w/ moderate AS, AVA 1.16cm^2, peak grandient 7mHg, mean grandient 346mg/  trace MR, mild calcification MV annulus , mild MV leaflet calcification, mild MVS, peak grandient 4.19m1m, mean grandient 2.7mm219m trace TR    FAMILY HISTORY Family History  Problem Relation Age of Onset   Stroke Mother 80  35ypertension Mother    Hypertension Father    Heart disease Father    Heart attack Father 70  1eart murmur Father    Hypertension Sister    Lung disease Brother    Colon cancer Neg Hx    Stomach cancer Neg Hx    Rectal cancer Neg Hx    Pancreatic cancer Neg Hx     SOCIAL HISTORY Social History   Tobacco Use   Smoking status: Former    Packs/day: 0.25    Years: 1.00    Pack years: 0.25    Types: Cigarettes    Quit date: 02/04/1967    Years since quitting: 54.5   Smokeless tobacco:  Never   Tobacco comments:    smoked 1 q2-3 days  Vaping Use   Vaping Use: Never used  Substance Use Topics   Alcohol use: Yes    Comment: occasional beer   Drug use: No       OPHTHALMIC EXAM:  Base Eye Exam     Visual Acuity (Snellen - Linear)       Right Left   Dist cc 20/50 20/20 -2   Dist ph cc 20/40 +2          Tonometry (Applanation, 1:50 PM)       Right Left   Pressure 27 15         Pupils       Dark Light Shape React APD   Right 4 3 Round Brisk None   Left 4 3 Round Brisk None         Visual Fields       Left Right    Full Full         Extraocular Movement       Right Left    Full, Ortho Full, Ortho         Neuro/Psych     Oriented x3: Yes   Mood/Affect: Normal         Dilation     Both eyes: 1.0% Mydriacyl, 2.5% Phenylephrine @ 1:50 PM           Slit Lamp and Fundus Exam     Slit Lamp Exam       Right Left   Lids/Lashes mild Meibomian gland dysfunction, mild Telangiectasia Dermatochalasis - upper lid, Meibomian gland dysfunction, Edema, Erythema lower lid, positive concretion left palp conj  Conjunctiva/Sclera White and quiet, residual STK ST quad -- basically gone Temporal Pinguecula   Cornea well healed superior corneal wound, trace Punctate epithelial erosions, Arcus, mild tear film debris, fine endo pigment 2+ fine Punctate epithelial erosions, trace endo pigment, Debris in tear film   Anterior Chamber Deep, 0.5+ cell/pigment Deep and quiet   Iris Round and dilated to 90m, scattered Transillumination defects 360, +iridodenesis, +pigment deposition, +atrophy Round and dilated   Lens Sutured Akreos IOL well centered 3 piece Posterior chamber intraocular in good position, trace Posterior capsular opacification   Anterior Vitreous post vitrectomy, trace pigment Vitreous syneresis, Posterior vitreous detachment         Fundus Exam       Right Left   Disc 1+Pallor, Sharp rim 2+Pallor, Sharp rim, temporal PPA, mild  tilt   C/D Ratio 0.3 0.5   Macula Flat, Blunted foveal reflex, mild RPE mottling and clumping, persistent central cystic changes - slightly increased, +pigment deposition on retinal surface -- improved, scattered MA, mild ERM Flat, Blunted foveal reflex, Retinal pigment epithelial mottling and clumping, No heme or edema   Vessels Mild Vascular attenuation, mild Tortuousity, mild A/V crossing changes Vascular attenuation, mild Tortuous   Periphery attached, good 360 laser in place; ORIGINALLY: bullous superior retinal detachment from 1000-0130, another more shallow detachment lobe from 130-400, lattice and micro tears ST quadrant; Old retinal tear at 0900 with surrounding laser Attached, peripheral laser scars at 1030, No new RT/RD           Refraction     Wearing Rx       Sphere Cylinder Axis Add   Right -1.25 +1.50 025 +2.75   Left -1.00 +1.75 180 +2.75           IMAGING AND PROCEDURES  Imaging and Procedures for _0 @  OCT, Retina - OU - Both Eyes       Right Eye Quality was good. Central Foveal Thickness: 309. Progression has worsened. Findings include normal foveal contour, no SRF, intraretinal fluid (Mild interval increase in central cystic changes nasal fovea, trace ERM).   Left Eye Quality was good. Central Foveal Thickness: 266. Progression has been stable. Findings include normal foveal contour, no IRF, no SRF.   Notes *Images captured and stored on drive  Diagnosis / Impression:  OD: Mild interval increase in central cystic changes nasal fovea, trace ERM OS: NFP, no IRF/SRF   Clinical management:  See below  Abbreviations: NFP - Normal foveal profile. CME - cystoid macular edema. PED - pigment epithelial detachment. IRF - intraretinal fluid. SRF - subretinal fluid. EZ - ellipsoid zone. ERM - epiretinal membrane. ORA - outer retinal atrophy. ORT - outer retinal tubulation. SRHM - subretinal hyper-reflective material            ASSESSMENT/PLAN:     ICD-10-CM   1. Dislocation of intraocular lens, sequela  T85.22XS     2. Aphakia of right eye  H27.01     3. Right retinal detachment  H33.21     4. 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 History of repair of retinal tear by laser photocoagulation  Z98.890     6. Diabetes mellitus type 2 without retinopathy ([place]  E11.9 OCT, Retina - OU - Both Eyes    7. Essential hypertension  I10     8. Hypertensive retinopathy of both eyes  H35.033     9. Pseudophakia of both eyes  Z96.1     10. Cystoid  macular edema of right eye  H35.351 OCT, Retina - OU - Both Eyes    11. 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 Dislocated IOL OD  - pt with history of partially dislocated 3-piece IOL -- spontaneously dislocated completely into vitreous cavity on 1.23.21  - s/p 25g PPV w/ IOL explantation and secondary sutured IOL OD -- Akreos AO60 lens, 17.5D -- 02.11.2021             - IOL in good position  - 2 superior nylon sutures removed at slit lamp 03.12.21 -- last nylon suture removed at slit lamp, 04.30.21  - corneal edema resolved; PEE improved             - s/p STK OD #1 (07.09.21), #2 (01.26.22), #3 (08.30.22) for CME  - BCVA 20/40  - OCT shows interval increase in IRF/CME (see #11 below)  - IOP 27  - ATs QID OU              - f/u 6 wks --DFE/OCT   2-4. Rhegmatogenous retinal detachment, right eye  - bullous, superior, mac off detachment, onset of foveal involvement 11.11.20 by history  - Bi-lobed superior detachment, superior lobe spanning 1000-0130, nasal lobe 0130-0400  - lattice degeneration with microtears noted at 1030  - s/p PPV/PFC/EL/FAX/14% C3F8 OD, 11.12.20             - intraop: HST at 2 oclock was found; also detachment had progressed to subtotal detachment spanning 9 oclock to 6 oclock (going in clockwise direction); also severe zonular insufficiency with IOL very mobile throughout case -- did not dislocate completely during the case or immediately post  op  - doing well             - retina attached and in good position--good laser in place  5. History of retinal defects s/p laser retinopexy OU with Dr. Zigmund Daniel in 2013  - OD laser at 0900  - OS laser at 1030  - stable  6. Diabetes mellitus, type 2 without retinopathy  - A1c was 6.2 in August 2022  - The incidence, risk factors for progression, natural history and treatment options for diabetic retinopathy  were discussed with patient.    - The need for close monitoring of blood glucose, blood pressure, and serum lipids, avoiding cigarette or any type of tobacco, and the need for long term follow up was also discussed with patient  7,8. Hypertensive retinopathy OU  - discussed importance of tight BP control  - monitor  9. Pseudophakia OU  - s/p CE/IOL OU (Dr. [name])  - 3 piece IOL OD was completely displaced into vitreous -- now with sutured Akreos IOL in good position OD -- see above  - OS 3-piece IOL in good position  - monitor  10. CME OD  - post op edema-- recurrent  - s/p STK OD #1 07.09.21, #2 (01.26.22), #3 (08.24.22)  - today, interval increase in cystic changes nasal fovea -- 4 months post STK  - cont PF QID OD and Prolensa QID OD -- will switch PF to Lotemax SM QID OD  - add Cosopt and brimonidine BID OD due to increased IOP  - f/u 2 wks DFE, OCT  11. Ocular Hypertension OD  - IOP: 27 - ?steroid response  - will start brimonidine and Cospot BID OD    Ophthalmic Meds Ordered this visit:  Meds ordered this encounter  Medications   brimonidine (ALPHAGAN) 0.2 %  ophthalmic solution    Sig: Place 1 drop into the right eye every 8 (eight) hours.    Dispense:  10 mL    Refill:  3   dorzolamide-timolol (COSOPT) 22.3-6.8 MG/ML ophthalmic solution    Sig: Place 1 drop into the right eye 2 (two) times daily.    Dispense:  10 mL    Refill:  3   Loteprednol Etabonate (LOTEMAX SM) 0.38 % GEL    Sig: Place 1 drop into the right eye 4 (four) times daily.    Dispense:  5 g     Refill:  3     Return in about 2 weeks (around 08/11/2021) for f/u 2 weeks, IOP check OD, DFE, OCT.  There are no Patient Instructions on file for this visit.  This document serves as a record of services personally performed by Gardiner Sleeper, MD, PhD. It was created on their behalf by Orvan [place], an ophthalmic technician. The creation of this record is the provider's dictation and/or activities during the visit.    Electronically signed by: Orvan [place], OA, 07/28/21  5:18 PM  Gardiner Sleeper, M.D., Ph.D. Diseases & Surgery of the Retina and Vitreous [facility]  I have reviewed the above documentation for accuracy and completeness, and I agree with the above. Gardiner Sleeper, M.D., Ph.D. 07/28/21 5:20 PM   Abbreviations: M myopia (nearsighted); A astigmatism; H hyperopia (farsighted); P presbyopia; Mrx spectacle prescription;  CTL contact lenses; OD right eye; OS left eye; OU both eyes  XT exotropia; ET esotropia; PEK punctate epithelial keratitis; PEE punctate epithelial erosions; DES dry eye syndrome; MGD meibomian gland dysfunction; ATs artificial tears; PFAT's preservative free artificial tears; [place] nuclear sclerotic cataract; PSC posterior subcapsular cataract; ERM epi-retinal membrane; PVD posterior vitreous detachment; RD retinal detachment; DM diabetes mellitus; DR diabetic retinopathy; NPDR non-proliferative diabetic retinopathy; PDR proliferative diabetic retinopathy; CSME clinically significant macular edema; DME diabetic macular edema; dbh dot blot hemorrhages; CWS cotton wool spot; POAG primary open angle glaucoma; C/D cup-to-disc ratio; HVF humphrey visual field; GVF goldmann visual field; OCT optical coherence tomography; IOP intraocular pressure; BRVO Branch retinal vein occlusion; CRVO central retinal vein occlusion; CRAO central retinal artery occlusion; BRAO branch retinal artery occlusion; RT retinal tear; SB scleral buckle; PPV pars  plana vitrectomy; VH Vitreous hemorrhage; PRP panretinal laser photocoagulation; IVK intravitreal kenalog; VMT vitreomacular traction; MH Macular hole;  NVD neovascularization of the disc; NVE neovascularization elsewhere; AREDS age related eye disease study; ARMD age related macular degeneration; POAG primary open angle glaucoma; EBMD epithelial/anterior basement membrane dystrophy; ACIOL anterior chamber intraocular lens; IOL intraocular lens; PCIOL posterior chamber intraocular lens; Phaco/IOL phacoemulsification with intraocular lens placement; [place] photorefractive keratectomy; LASIK laser assisted in situ keratomileusis; HTN hypertension; DM diabetes mellitus; COPD chronic obstructive pulmonary disease

## 2021-07-28 ENCOUNTER — Ambulatory Visit (INDEPENDENT_AMBULATORY_CARE_PROVIDER_SITE_OTHER): Payer: Medicare Other | Admitting: Ophthalmology

## 2021-07-28 ENCOUNTER — Other Ambulatory Visit: Payer: Self-pay

## 2021-07-28 ENCOUNTER — Encounter (INDEPENDENT_AMBULATORY_CARE_PROVIDER_SITE_OTHER): Payer: Self-pay | Admitting: Ophthalmology

## 2021-07-28 DIAGNOSIS — H3321 Serous retinal detachment, right eye: Secondary | ICD-10-CM

## 2021-07-28 DIAGNOSIS — Z9889 Other specified postprocedural states: Secondary | ICD-10-CM

## 2021-07-28 DIAGNOSIS — Z961 Presence of intraocular lens: Secondary | ICD-10-CM

## 2021-07-28 DIAGNOSIS — H35351 Cystoid macular degeneration, right eye: Secondary | ICD-10-CM

## 2021-07-28 DIAGNOSIS — H35411 Lattice degeneration of retina, right eye: Secondary | ICD-10-CM | POA: Diagnosis not present

## 2021-07-28 DIAGNOSIS — H2701 Aphakia, right eye: Secondary | ICD-10-CM | POA: Diagnosis not present

## 2021-07-28 DIAGNOSIS — H35033 Hypertensive retinopathy, bilateral: Secondary | ICD-10-CM | POA: Diagnosis not present

## 2021-07-28 DIAGNOSIS — H40051 Ocular hypertension, right eye: Secondary | ICD-10-CM

## 2021-07-28 DIAGNOSIS — I10 Essential (primary) hypertension: Secondary | ICD-10-CM

## 2021-07-28 DIAGNOSIS — T8522XS Displacement of intraocular lens, sequela: Secondary | ICD-10-CM

## 2021-07-28 DIAGNOSIS — E119 Type 2 diabetes mellitus without complications: Secondary | ICD-10-CM

## 2021-07-28 MED ORDER — BRIMONIDINE TARTRATE 0.2 % OP SOLN: 1.0000 [drp] | Freq: Three times a day (TID) | OPHTHALMIC | 3 refills | Status: DC

## 2021-07-28 MED ORDER — DORZOLAMIDE HCL-TIMOLOL MAL 2-0.5 % OP SOLN: 1.0000 [drp] | Freq: Two times a day (BID) | OPHTHALMIC | 3 refills | Status: DC

## 2021-07-28 MED ORDER — LOTEMAX SM 0.38 % OP GEL: 1.0000 [drp] | Freq: Four times a day (QID) | OPHTHALMIC | 3 refills | Status: DC

## 2021-08-10 ENCOUNTER — Inpatient Hospital Stay: Payer: Medicare Other | Attending: Oncology

## 2021-08-10 ENCOUNTER — Inpatient Hospital Stay (HOSPITAL_BASED_OUTPATIENT_CLINIC_OR_DEPARTMENT_OTHER): Payer: Medicare Other | Admitting: Oncology

## 2021-08-10 ENCOUNTER — Other Ambulatory Visit: Payer: Self-pay

## 2021-08-10 VITALS — BP 137/56 | HR 63 | Temp 97.7°F | Resp 17 | Ht 68.0 in | Wt 215.5 lb

## 2021-08-10 DIAGNOSIS — C61 Malignant neoplasm of prostate: Secondary | ICD-10-CM | POA: Diagnosis not present

## 2021-08-10 DIAGNOSIS — D649 Anemia, unspecified: Secondary | ICD-10-CM

## 2021-08-10 DIAGNOSIS — D509 Iron deficiency anemia, unspecified: Secondary | ICD-10-CM | POA: Insufficient documentation

## 2021-08-10 DIAGNOSIS — D638 Anemia in other chronic diseases classified elsewhere: Secondary | ICD-10-CM | POA: Diagnosis not present

## 2021-08-10 LAB — CBC WITH DIFFERENTIAL (CANCER CENTER ONLY)
Abs Immature Granulocytes: 0.02 10*3/uL (ref 0.00–0.07)
Basophils Absolute: 0.1 10*3/uL (ref 0.0–0.1)
Basophils Relative: 1 %
Eosinophils Absolute: 0.6 10*3/uL — ABNORMAL HIGH (ref 0.0–0.5)
Eosinophils Relative: 9 %
HCT: 28.7 % — ABNORMAL LOW (ref 39.0–52.0)
Hemoglobin: 9.7 g/dL — ABNORMAL LOW (ref 13.0–17.0)
Immature Granulocytes: 0 %
Lymphocytes Relative: 19 %
Lymphs Abs: 1.3 10*3/uL (ref 0.7–4.0)
MCH: 29.4 pg (ref 26.0–34.0)
MCHC: 33.8 g/dL (ref 30.0–36.0)
MCV: 87 fL (ref 80.0–100.0)
Monocytes Absolute: 0.7 10*3/uL (ref 0.1–1.0)
Monocytes Relative: 10 %
Neutro Abs: 4.3 10*3/uL (ref 1.7–7.7)
Neutrophils Relative %: 61 %
Platelet Count: 172 10*3/uL (ref 150–400)
RBC: 3.3 MIL/uL — ABNORMAL LOW (ref 4.22–5.81)
RDW: 13.8 % (ref 11.5–15.5)
WBC Count: 7 10*3/uL (ref 4.0–10.5)
nRBC: 0 % (ref 0.0–0.2)

## 2021-08-10 LAB — IRON AND IRON BINDING CAPACITY (CC-WL,HP ONLY)
Iron: 63 ug/dL (ref 45–182)
Saturation Ratios: 20 % (ref 17.9–39.5)
TIBC: 309 ug/dL (ref 250–450)
UIBC: 246 ug/dL (ref 117–376)

## 2021-08-10 LAB — VITAMIN B12: Vitamin B-12: 354 pg/mL (ref 180–914)

## 2021-08-10 LAB — FERRITIN: Ferritin: 210 ng/mL (ref 24–336)

## 2021-08-10 NOTE — Progress Notes (Signed)
Hematology and Oncology Follow Up Visit  INFANT ZINK [id number] [birth year] 81 y.o. 08/10/2021 2:36 PM [name], MDSouth, [name], MD   Principle Diagnosis: 81 year old man with iron deficiency anemia as well as anemia of chronic disease diagnosed in 2019.    Secondary diagnosis: Prostate cancer presented with localized disease Gleason score of 4+4 = 8 and a PSA 4.4 in 2018.  Prior Therapy:  Definitive radiation therapy utilizing IMRT between August 15, 2017 and September 18, 2017.  He also received brachytherapy boost upfront on January 4 of 2019.  He received 45 Gy in 25 fraction to supplement his post implant of 110 Gy.  He completed androgen deprivation therapy under the care of Dr. Junious Silk for close to 2 years.  He status post IV iron infusion in August 2019.  He received Feraheme in November 2020.  Current therapy: Supportive care with oral iron therapy.  Interim History: Mr. Matheson presents today for repeat evaluation.  Since the last visit, he reports no major changes in his health.  He denies any recent hospitalizations or illnesses.  He denies hematochezia, melena or hemoptysis.  He denies any falls or syncope.  He denies any chest pain shortness of breath.  His performance status quality of life remains unchanged.       Medications: Reviewed without changes. Current Outpatient Medications  Medication Sig Dispense Refill   amiodarone (PACERONE) 200 MG tablet Take 200 mg by mouth daily. (Patient not taking: Reported on 06/16/2021)     atorvastatin (LIPITOR) 80 MG tablet TAKE 1 TABLET BY MOUTH  DAILY 90 tablet 3   brimonidine (ALPHAGAN) 0.2 % ophthalmic solution Place 1 drop into the right eye every 8 (eight) hours. 10 mL 3   Bromfenac Sodium (PROLENSA) 0.07 % SOLN Place 1 drop into the right eye 4 (four) times daily. 3 mL 3   colesevelam (WELCHOL) 625 MG tablet Take 625 mg by mouth 3 (three) times daily.      donepezil (ARICEPT) 10 MG tablet Take 10 mg by mouth every  morning.      dorzolamide-timolol (COSOPT) 22.3-6.8 MG/ML ophthalmic solution Place 1 drop into the right eye 2 (two) times daily. 10 mL 3   esomeprazole (NEXIUM) 40 MG capsule Take 40 mg by mouth daily.      ferrous sulfate 325 (65 FE) MG tablet TAKE 1 TABLET BY MOUTH EVERY DAY WITH BREAKFAST 90 tablet 3   gabapentin (NEURONTIN) 300 MG capsule      insulin glargine (LANTUS) 100 UNIT/ML injection Inject 20-40 Units into the skin at bedtime as needed (BGL below 140-150 take 20 units, if 151 or higher take 40 units).     Loteprednol Etabonate (LOTEMAX SM) 0.38 % GEL Place 1 drop into the right eye 4 (four) times daily. 5 g 3   memantine (NAMENDA) 10 MG tablet Take 1 tablet (10 mg total) by mouth 2 (two) times daily. 180 tablet 1   metoprolol tartrate (LOPRESSOR) 25 MG tablet TAKE 1 TABLET BY MOUTH  DAILY (Patient taking differently: Take 12.5 mg by mouth 2 (two) times daily.) 90 tablet 3   mirabegron ER (MYRBETRIQ) 50 MG TB24 tablet Take 50 mg by mouth at bedtime.      nitroGLYCERIN (NITROSTAT) 0.4 MG SL tablet Place 1 tablet (0.4 mg total) under the tongue every 5 (five) minutes as needed for up to 25 days for chest pain. 25 tablet 3   omega-3 acid ethyl esters (LOVAZA) 1 G capsule Take 2 g by mouth daily.  prednisoLONE acetate (PRED FORTE) 1 % ophthalmic suspension Place 1 drop into the right eye 4 (four) times daily. 15 mL 0   rOPINIRole (REQUIP) 2 MG tablet Take 2 mg by mouth 3 (three) times daily as needed (RLS).      spironolactone (ALDACTONE) 25 MG tablet TAKE ONE-HALF TABLET BY  MOUTH DAILY 45 tablet 3   tamsulosin (FLOMAX) 0.4 MG CAPS capsule Take 0.4 mg by mouth daily after supper.      torsemide (DEMADEX) 20 MG tablet Take 1 tablet (20 mg total) by mouth as directed. 2 times a week on Tuesday and Thursday.  Take additional dose as directed with weight gain of 2 Lbs 90 tablet 1   valsartan-hydrochlorothiazide (DIOVAN-HCT) 80-12.5 MG tablet TAKE 1 TABLET BY MOUTH IN  THE MORNING 90 tablet  3   vitamin B-12 (CYANOCOBALAMIN) 1000 MCG tablet Take 1,000 mcg by mouth 2 (two) times daily.     XARELTO 15 MG TABS tablet TAKE 1 TABLET BY MOUTH IN  THE EVENING AFTER DINNER 90 tablet 3   No current facility-administered medications for this visit.     Allergies: No Known Allergies      Physical Exam:  Blood pressure (!) 137/56, pulse 63, temperature 97.7 F (36.5 C), temperature source Temporal, resp. rate 17, height _0  (1.727 m), weight 215 lb 8 oz (97.8 kg), SpO2 98 %.     ECOG: 1   General appearance: Comfortable appearing without any discomfort Head: Normocephalic without any trauma Oropharynx: Mucous membranes are moist and pink without any thrush or ulcers. Eyes: Pupils are equal and round reactive to light. Lymph nodes: No cervical, supraclavicular, inguinal or axillary lymphadenopathy.   Heart:regular rate and rhythm.  S1 and S2 without leg edema. Lung: Clear without any rhonchi or wheezes.  No dullness to percussion. Abdomin: Soft, nontender, nondistended with good bowel sounds.  No hepatosplenomegaly. Musculoskeletal: No joint deformity or effusion.  Full range of motion noted. Neurological: No deficits noted on motor, sensory and deep tendon reflex exam. Skin: No petechial rash or dryness.  Appeared moist.         Lab Results: Lab Results  Component Value Date   WBC 6.2 12/31/2020   HGB 9.5 (L) 12/31/2020   HCT 29.2 (L) 12/31/2020   MCV 87.2 12/31/2020   PLT 171 12/31/2020     Chemistry      Component Value Date/Time   NA 141 03/03/2021 1605   K 4.5 03/03/2021 1605   CL 105 03/03/2021 1605   CO2 22 03/03/2021 1605   BUN 17 03/03/2021 1605   CREATININE 1.18 03/03/2021 1605      Component Value Date/Time   CALCIUM 9.3 03/03/2021 1605   ALKPHOS 17 (L) 09/15/2020 1154   AST 18 09/15/2020 1154   ALT 21 09/15/2020 1154   BILITOT 0.4 09/15/2020 1154          Impression and Plan:  81 year old man with:    1.  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    Treatment choices moving forward were discussed at this time.  Replacing iron as well as growth factor support as well as continued active surveillance were reviewed.  We will update iron studies today and replace as needed.  The role of bone marrow biopsy also considered and will be done if he has further cytopenias.  Other etiologies including a plasma cell disorder and other vitamin deficiency are considered.  Laboratory testing will also obtained  today and will be discussed if any abnormalities are noted.  In the meantime I recommended continued active surveillance and continue oral iron replacement.   2.  Prostate cancer with radiation therapy completed in 2018.     3.  Follow-up: In 6 months for repeat follow-up.  30  minutes were dedicated to this encounter.  Time was spent on reviewing differential diagnosis, laboratory data evaluation and future plan of care discussion.   Zola Button, MD 1/10/20232:36 PM

## 2021-08-12 LAB — ERYTHROPOIETIN: Erythropoietin: 17.5 m[IU]/mL (ref 2.6–18.5)

## 2021-08-12 NOTE — Progress Notes (Signed)
[facility] Note  08/13/2021     CHIEF COMPLAINT Patient presents for Retina Follow Up    HISTORY OF PRESENT ILLNESS: Cody Phelps is a 81 y.o. male who presents to the clinic today for:  HPI     Retina Follow Up   Patient presents with  Other.  In right eye.  This started 2 weeks ago.  Severity is moderate.  Duration of 2 weeks.  Since onset it is gradually improving.  I, the attending physician,  performed the HPI with the patient and updated documentation appropriately.        Comments   81 y/o male pt here for 2 wk f/u for CME/elevated IOP OD.  Did not receive inj last visit due to elevated IOP.  VA OD seems slightly improved.  No change in [place] OS.  Denies pain, FOL, floaters.  PF and Prolensa QID OD, Cosopt and Brimonidine BID OD.      Last edited by [name], MD on 08/15/2021  3:09 AM.      Referring physician: [name], MD [place],  [place] [address]  HISTORICAL INFORMATION:   Selected notes from the [facility] Referred by Dr. [name] for concern of RD OD   CURRENT MEDICATIONS: Current Outpatient Medications (Ophthalmic Drugs)  Medication Sig   brimonidine (ALPHAGAN) 0.2 % ophthalmic solution Place 1 drop into the right eye every 8 (eight) hours.   Bromfenac Sodium (PROLENSA) 0.07 % SOLN Place 1 drop into the right eye 4 (four) times daily.   dorzolamide-timolol (COSOPT) 22.3-6.8 MG/ML ophthalmic solution Place 1 drop into the right eye 2 (two) times daily.   Loteprednol Etabonate (LOTEMAX SM) 0.38 % GEL Place 1 drop into the right eye 4 (four) times daily.   prednisoLONE acetate (PRED FORTE) 1 % ophthalmic suspension Place 1 drop into the right eye 4 (four) times daily.   No current facility-administered medications for this visit. (Ophthalmic Drugs)   Current Outpatient Medications (Other)  Medication Sig   amiodarone (PACERONE) 200 MG tablet Take 200 mg by mouth daily.   atorvastatin  (LIPITOR) 80 MG tablet TAKE 1 TABLET BY MOUTH  DAILY   colesevelam (WELCHOL) 625 MG tablet Take 625 mg by mouth 3 (three) times daily.    donepezil (ARICEPT) 10 MG tablet Take 10 mg by mouth every morning.    esomeprazole (NEXIUM) 40 MG capsule Take 40 mg by mouth daily.    ferrous sulfate 325 (65 FE) MG tablet TAKE 1 TABLET BY MOUTH EVERY DAY WITH BREAKFAST   gabapentin (NEURONTIN) 300 MG capsule    HYDROcodone bit-homatropine (HYCODAN) 5-1.5 MG/5ML syrup SMARTSIG:5 Milliliter(s) By Mouth Every 12 Hours PRN   insulin glargine (LANTUS) 100 UNIT/ML injection Inject 20-40 Units into the skin at bedtime as needed (BGL below 140-150 take 20 units, if 151 or higher take 40 units).   LAGEVRIO 200 MG CAPS capsule SMARTSIG:4 Capsule(s) By Mouth Every 12 Hours   memantine (NAMENDA) 10 MG tablet Take 1 tablet (10 mg total) by mouth 2 (two) times daily.   metoprolol tartrate (LOPRESSOR) 25 MG tablet TAKE 1 TABLET BY MOUTH  DAILY (Patient taking differently: Take 12.5 mg by mouth 2 (two) times daily.)   mirabegron ER (MYRBETRIQ) 50 MG TB24 tablet Take 50 mg by mouth at bedtime.    omega-3 acid ethyl esters (LOVAZA) 1 G capsule Take 2 g by mouth daily.   rOPINIRole (REQUIP) 2 MG tablet Take 2 mg  by mouth 3 (three) times daily as needed (RLS).    spironolactone (ALDACTONE) 25 MG tablet TAKE ONE-HALF TABLET BY  MOUTH DAILY   tamsulosin (FLOMAX) 0.4 MG CAPS capsule Take 0.4 mg by mouth daily after supper.    torsemide (DEMADEX) 20 MG tablet Take 1 tablet (20 mg total) by mouth as directed. 2 times a week on Tuesday and Thursday.  Take additional dose as directed with weight gain of 2 Lbs   valsartan-hydrochlorothiazide (DIOVAN-HCT) 80-12.5 MG tablet TAKE 1 TABLET BY MOUTH IN  THE MORNING   vitamin B-12 (CYANOCOBALAMIN) 1000 MCG tablet Take 1,000 mcg by mouth 2 (two) times daily.   XARELTO 15 MG TABS tablet TAKE 1 TABLET BY MOUTH IN  THE EVENING AFTER DINNER   nitroGLYCERIN (NITROSTAT) 0.4 MG SL tablet Place 1  tablet (0.4 mg total) under the tongue every 5 (five) minutes as needed for up to 25 days for chest pain.   No current facility-administered medications for this visit. (Other)   REVIEW OF SYSTEMS: ROS   Positive for: Gastrointestinal, Musculoskeletal, Endocrine, Cardiovascular, Eyes, Respiratory Negative for: Constitutional, Neurological, Skin, Genitourinary, HENT, Psychiatric, Allergic/Imm, Heme/Lymph Last edited by Matthew Folks, COA on 08/13/2021  1:29 PM.      ALLERGIES No Known Allergies  PAST MEDICAL HISTORY Past Medical History:  Diagnosis Date   Acute anterior wall MI ([place]) 03/01/2018   Acute combined systolic and diastolic heart failure ([place]) 03/15/2018   Burn    Chronic diastolic CHF (congestive heart failure) ([place]) 09/21/2018   Defect, retina, with detachment    Dementia ([place])    Diverticulosis    ED (erectile dysfunction)    First degree heart block    GERD (gastroesophageal reflux disease)    Heart murmur    History of blood transfusion    with open heart surgery   Hypertension    cardiologsit-  dr [name]   Hypertensive retinopathy    OU   Iron deficiency anemia    Mixed hyperlipidemia    Morbid obesity ([place])    Myocardial infarction ([place])    02/28/18   OA (osteoarthritis)    right hip   OSA on CPAP    followed by dr dohmeier, uses CPAP   PAF (paroxysmal atrial fibrillation) ([place])    a. on Eliquis   Prostate cancer ([place])    dx 03-17-2017 (bx)  Stage T2a, Gleason 4+4, PSA  4.43, vol 32.32--  s/p radiatctive prostate seed implants 07-10-2017 then IMRT and ADT   Retinal detachment    Rheg. 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   LIMA to Diagonal Branch   S/P Maze operation for atrial fibrillation 05/30/2018   Complete bilateral atrial lesion set using bipolar radiofrequency and cryothermy  ablation with clipping of LA appendage   Scoliosis    Trigger finger    Type 2 diabetes mellitus ([place])    Wears partial dentures    upper and lower   Past Surgical History:  Procedure Laterality Date   AORTIC VALVE REPLACEMENT N/A 05/30/2018   Procedure: AORTIC VALVE REPLACEMENT (AVR) using Inspiris Valve, Size 23;  Surgeon: [name], MD;  Location: [facility];  Service: Open Heart Surgery;  Laterality: N/A;   APPENDECTOMY  1988   BILATERAL TOTAL ETHMOIDECTOMY AND SPHENOIDECTOMY  01-14-2009    dr Redmond [facility]   CARDIOVERSION N/A 08/10/2018   Procedure: CARDIOVERSION;  Surgeon: Adrian Prows, MD;  Location: [facility];  Service: Cardiovascular;  Laterality: N/A;   CARPAL TUNNEL RELEASE Bilateral 2013   CATARACT EXTRACTION Bilateral    Dr. [name]   CATARACT EXTRACTION W/ INTRAOCULAR LENS  IMPLANT, BILATERAL  date?   CORONARY ARTERY BYPASS GRAFT N/A 05/30/2018   CORONARY BALLOON ANGIOPLASTY N/A 03/01/2018   Procedure: CORONARY BALLOON ANGIOPLASTY;  Surgeon: Adrian Prows, MD;  Location: [facility];  Service: Cardiovascular;  Laterality: N/A;   CORONARY/GRAFT ACUTE MI REVASCULARIZATION N/A 03/01/2018   Procedure: Coronary/Graft Acute MI Revascularization;  Surgeon: Adrian Prows, MD;  Location: [facility];  Service: Cardiovascular;  Laterality: N/A;   CYSTOSCOPY  07/19/2017   Procedure: CYSTOSCOPY;  Surgeon: Nickie Retort, MD;  Location: [facility];  Service: Urology;;  No seeds found in [place] Bilateral    Cat Sx OU.  Dislocated IOL sx OD.  Rheg. RD repair OS.   EYE SURGERY     GAS/FLUID EXCHANGE Right 06/13/2019   Procedure: GAS/FLUID EXCHANGE;  Surgeon: [name], MD;  Location: [facility];  Service: Ophthalmology;  Laterality: Right;   LAMINECTOMY WITH POSTERIOR LATERAL ARTHRODESIS LEVEL 2 N/A 02/12/2014   Procedure: LUMBAR TWO-THREE,LUIMBAR THREE-FOUR LAMINECTOMY/FORAMINOTOMY;POSSIBLE POSTEROLATERAL ARTHRODESIS WITH AUTOGRAFT;  Surgeon:  [name], MD;  Location: [facility];  Service: Neurosurgery;  Laterality: N/A;   LEFT HEART CATH AND CORONARY ANGIOGRAPHY N/A 03/01/2018   Procedure: LEFT HEART CATH AND CORONARY ANGIOGRAPHY;  Surgeon: Adrian Prows, MD;  Location: [facility];  Service: Cardiovascular;  Laterality: N/A;   MAZE N/A 05/30/2018   Procedure: MAZE;  Surgeon: [name], MD;  Location: [facility];  Service: Open Heart Surgery;  Laterality: N/A;   ORIF RIGHT ANKLE FX'S  05/05/2001   retained hardware   PARS PLANA VITRECTOMY Right 06/13/2019   Procedure: PARS PLANA VITRECTOMY WITH 25 GAUGE WITH LASER AND GAS;  Surgeon: [name], MD;  Location: [facility];  Service: Ophthalmology;  Laterality: Right;   PARS PLANA VITRECTOMY Right 09/12/2019   Procedure: Pars Plana Vitrectomy With 25 Gauge;  Surgeon: [name], MD;  Location: [facility];  Service: Ophthalmology;  Laterality: Right;   PERFLUORONE INJECTION Right 06/13/2019   Procedure: PERFLUORONE INJECTION;  Surgeon: [name], MD;  Location: [facility];  Service: Ophthalmology;  Laterality: Right;   PHOTOCOAGULATION WITH LASER Right 06/13/2019   Procedure: PHOTOCOAGULATION WITH LASER;  Surgeon: [name], MD;  Location: [facility];  Service: Ophthalmology;  Laterality: Right;   PLACEMENT AND SUTURE OF SECONDARY INTRAOCULAR LENS Right 09/12/2019   Procedure: PLACEMENT AND SUTURE OF SECONDARY INTRAOCULAR LENS;  Surgeon: [name], MD;  Location: [facility];  Service: Ophthalmology;  Laterality: Right;   PROSTATE BIOPSY  03-17-2017   dr Pilar Jarvis office   RADIOACTIVE SEED IMPLANT N/A 07/19/2017   Procedure: RADIOACTIVE SEED IMPLANT/BRACHYTHERAPY IMPLANT;  Surgeon: Nickie Retort, MD;  Location: [facility];  Service: Urology;  Laterality: N/A;  69 seeds implanted   REMOVAL RETAINED LENS Right 09/12/2019   Procedure: REMOVAL RETAINED LENS;  Surgeon: [name], MD;  Location: [facility];  Service: Ophthalmology;  Laterality: Right;   RETINAL DETACHMENT  SURGERY Right 06/13/2019   Dr. Coralyn Pear   SKIN GRAFT     face   SPACE OAR INSTILLATION N/A 07/19/2017   Procedure: SPACE OAR INSTILLATION;  Surgeon: Nickie Retort, MD;  Location: [facility];  Service: Urology;  Laterality: N/A;   TEE WITHOUT CARDIOVERSION  03/20/2012   Procedure: TRANSESOPHAGEAL ECHOCARDIOGRAM (TEE);  Surgeon: Laverda Page, MD;  Location: [facility];  Service: Cardiovascular;  Laterality: N/A;  normal LV; normal EF; normal RV; normal LA w/ left atrial appendage very small, normal function, interatrial septum intact without defect; normal RA; trace MR,TR, & PI; mild AV calcification and senile degeneration w/ mild stenosis, AVA 1.7cm^2;;    TEE WITHOUT CARDIOVERSION N/A 05/30/2018   Procedure: TRANSESOPHAGEAL ECHOCARDIOGRAM (TEE);  Surgeon: [name], MD;  Location: [facility];  Service: Open Heart Surgery;  Laterality: N/A;   TOTAL HIP ARTHROPLASTY Right 10/23/2017   Procedure: TOTAL HIP ARTHROPLASTY ANTERIOR APPROACH;  Surgeon: Frederik Pear, MD;  Location: [facility];  Service: Orthopedics;  Laterality: Right;   TRANSTHORACIC ECHOCARDIOGRAM  01-11-2017   dr [name]  (per echo note, no significant change in seveity of AS, no other diagnostic change)   moderate concentric LVH, ef 73%, grade 1 diastolic dysfunction/  moderate LAE/  mild , grade 1 AR w/ moderate AV calcification, mild to moderate restricted AV leaflets w/ moderate AS, AVA 1.16cm^2, peak grandient 97mHg, mean grandient 357mg/  trace MR, mild calcification MV annulus , mild MV leaflet calcification, mild MVS, peak grandient 4.1m53m, mean grandient 2.7mm69m trace TR    FAMILY HISTORY Family History  Problem Relation Age of Onset   Stroke Mother 80  50ypertension Mother    Hypertension Father    Heart disease Father    Heart attack Father 70  34eart murmur Father    Hypertension Sister    Lung disease Brother    Colon cancer Neg Hx    Stomach cancer Neg Hx    Rectal cancer Neg Hx     Pancreatic cancer Neg Hx     SOCIAL HISTORY Social History   Tobacco Use   Smoking status: Former    Packs/day: 0.25    Years: 1.00    Pack years: 0.25    Types: Cigarettes    Quit date: 02/04/1967    Years since quitting: 54.5   Smokeless tobacco: Never   Tobacco comments:    smoked 1 q2-3 days  Vaping Use   Vaping Use: Never used  Substance Use Topics   Alcohol use: Yes    Comment: occasional beer   Drug use: No       OPHTHALMIC EXAM:  Base Eye Exam     Visual Acuity (Snellen - Linear)       Right Left   Dist [place] 20/40 -2 20/20 -2   Dist ph [place] 20/30 -2          Tonometry (Tonopen, 1:33 PM)       Right Left   Pressure 13 15         Pupils       Dark Light Shape React APD   Right 4 3 Round Brisk None   Left 4 3 Round Brisk None         Visual Fields (Counting fingers)       Left Right    Full Full         Extraocular Movement       Right Left    Full, Ortho Full, Ortho         Neuro/Psych     Oriented x3: Yes   Mood/Affect: Normal         Dilation     Right eye: 1.0% Mydriacyl, 2.5% Phenylephrine @  1:33 PM           Slit Lamp and Fundus Exam     Slit Lamp Exam       Right Left   Lids/Lashes mild Meibomian gland dysfunction, mild Telangiectasia Dermatochalasis - upper lid, Meibomian gland dysfunction, Edema, Erythema lower lid, positive concretion left palp conj   Conjunctiva/Sclera White and quiet, STK ST quad -- gone Temporal Pinguecula   Cornea well healed superior corneal wound, 1+ Punctate epithelial erosions, Arcus, mild tear film debris, fine endo pigment 2+ fine Punctate epithelial erosions, trace endo pigment, Debris in tear film   Anterior Chamber Deep and quiet Deep and quiet   Iris Round and dilated to 54m, scattered Transillumination defects 360, +iridodenesis, +pigment deposition, +atrophy Round and dilated   Lens Sutured Akreos IOL well centered 3 piece Posterior chamber intraocular in good position, trace  Posterior capsular opacification   Anterior Vitreous post vitrectomy, trace pigment Vitreous syneresis, Posterior vitreous detachment         Fundus Exam       Right Left   Disc 1+Pallor, Sharp rim 2+Pallor, Sharp rim, temporal PPA, mild tilt   C/D Ratio 0.3 0.5   Macula Flat, Blunted foveal reflex, mild RPE mottling and clumping central cystic changes - improved, +pigment deposition on retinal surface -- improved, scattered MA, mild ERM Flat, Blunted foveal reflex, Retinal pigment epithelial mottling and clumping, No heme or edema   Vessels Mild Vascular attenuation, mild Tortuousity, mild A/V crossing changes Vascular attenuation, mild Tortuous   Periphery attached, good 360 laser in place; ORIGINALLY: bullous superior retinal detachment from 1000-0130, another more shallow detachment lobe from 130-400, lattice and micro tears ST quadrant; Old retinal tear at 0900 with surrounding laser Attached, peripheral laser scars at 1030, No new RT/RD           IMAGING AND PROCEDURES  Imaging and Procedures for _0 @  OCT, Retina - OU - Both Eyes       Right Eye Quality was good. Central Foveal Thickness: 309. Progression has improved. Findings include normal foveal contour, no SRF, no IRF (interval improvement in central cystic changes nasal fovea, trace ERM).   Left Eye Quality was good. Central Foveal Thickness: 264. Progression has been stable. Findings include normal foveal contour, no IRF, no SRF.   Notes *Images captured and stored on drive  Diagnosis / Impression:  OD: interval improvement in central cystic changes nasal fovea, trace ERM OS: NFP, no IRF/SRF   Clinical management:  See below  Abbreviations: NFP - Normal foveal profile. CME - cystoid macular edema. PED - pigment epithelial detachment. IRF - intraretinal fluid. SRF - subretinal fluid. EZ - ellipsoid zone. ERM - epiretinal membrane. ORA - outer retinal atrophy. ORT - outer retinal tubulation. SRHM - subretinal  hyper-reflective material            ASSESSMENT/PLAN:    ICD-10-CM   1. Dislocation of intraocular lens, sequela  T85.22XS     2. Aphakia of right eye  H27.01     3. Right retinal detachment  H33.21     4. 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 History of repair of retinal tear by laser photocoagulation  Z98.890     6. Diabetes mellitus type 2 without retinopathy ([place]  E11.9 OCT, Retina - OU - Both Eyes    7. Essential hypertension  I10     8. Hypertensive retinopathy of both eyes  H35.033 OCT, Retina - OU -  Both Eyes    9. Pseudophakia of both eyes  Z96.1     10. Cystoid macular edema of right eye  H35.351 OCT, Retina - OU - Both Eyes    11. 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 Dislocated IOL OD  - pt with history of partially dislocated 3-piece IOL -- spontaneously dislocated completely into vitreous cavity on 1.23.21  - s/p 25g PPV w/ IOL explantation and secondary sutured IOL OD -- Akreos AO60 lens, 17.5D -- 02.11.2021             - IOL in good position  - 2 superior nylon sutures removed at slit lamp 03.12.21 -- last nylon suture removed at slit lamp, 04.30.21  - corneal edema resolved; PEE improved             - s/p STK OD #1 (07.09.21), #2 (01.26.22), #3 (08.30.22) for CME  - BCVA 20/30 - improved  - OCT shows interval increase in IRF/CME (see #11 below)  - IOP 27  - ATs QID OU              - f/u 4-6 wks --DFE/OCT   2-4. Rhegmatogenous retinal detachment, right eye  - bullous, superior, mac off detachment, onset of foveal involvement 11.11.20 by history  - Bi-lobed superior detachment, superior lobe spanning 1000-0130, nasal lobe 0130-0400  - lattice degeneration with microtears noted at 1030  - s/p PPV/PFC/EL/FAX/14% C3F8 OD, 11.12.20             - intraop: HST at 2 oclock was found; also detachment had progressed to subtotal detachment spanning 9 oclock to 6 oclock (going in clockwise direction); also severe zonular  insufficiency with IOL very mobile throughout case -- did not dislocate completely during the case or immediately post op  - doing well             - retina attached and in good position--good laser in place  5. History of retinal defects s/p laser retinopexy OU with Dr. Zigmund Daniel in 2013  - OD laser at 0900  - OS laser at 1030  - stable  6. Diabetes mellitus, type 2 without retinopathy  - A1c was 6.2 in August 2022  - The incidence, risk factors for progression, natural history and treatment options for diabetic retinopathy  were discussed with patient.    - The need for close monitoring of blood glucose, blood pressure, and serum lipids, avoiding cigarette or any type of tobacco, and the need for long term follow up was also discussed with patient.   7,8. Hypertensive retinopathy OU  - discussed importance of tight BP control  - monitor   9. Pseudophakia OU  - s/p CE/IOL OU (Dr. [name])  - 3 piece IOL OD was completely displaced into vitreous -- now with sutured Akreos IOL in good position OD -- see above  - OS 3-piece IOL in good position  - monitor   10. CME OD  - post op edema-- recurrent  - s/p STK OD #1 07.09.21, #2 (01.26.22), #3 (08.24.22)  - today, interval improvement in cystic changes nasal fovea -- 4 months post STK  - cont Lotemax SM QID OD and Prolensa QID OD -- decrease to BID OD  - cont Cosopt and brimonidine BID OD -- decrease to qdaily  - f/u 4-6 wks DFE, OCT  11. Ocular Hypertension OD  - IOP: 13 - ?steroid response -- improved today  - continue brimonidine and  Cospot BID OD -- decrease to qdaily    Ophthalmic Meds Ordered this visit:  No orders of the defined types were placed in this encounter.    Return for f/u 4-6 weeks, CME OD, OCT.  There are no Patient Instructions on file for this visit.  This document serves as a record of services personally performed by Gardiner Sleeper, MD, PhD. It was created on their behalf by Leonie Douglas, an ophthalmic  technician. The creation of this record is the provider's dictation and/or activities during the visit.    Electronically signed by: Leonie Douglas COA, 08/15/21  3:09 AM   Gardiner Sleeper, M.D., Ph.D. Diseases & Surgery of the Retina and [place] 08/13/2021  I have reviewed the above documentation for accuracy and completeness, and I agree with the above. Gardiner Sleeper, M.D., Ph.D. 08/15/21 3:10 AM  Abbreviations: M myopia (nearsighted); A astigmatism; H hyperopia (farsighted); P presbyopia; Mrx spectacle prescription;  CTL contact lenses; OD right eye; OS left eye; OU both eyes  XT exotropia; ET esotropia; PEK punctate epithelial keratitis; PEE punctate epithelial erosions; DES dry eye syndrome; MGD meibomian gland dysfunction; ATs artificial tears; PFAT's preservative free artificial tears; [place] nuclear sclerotic cataract; PSC posterior subcapsular cataract; ERM epi-retinal membrane; PVD posterior vitreous detachment; RD retinal detachment; DM diabetes mellitus; DR diabetic retinopathy; NPDR non-proliferative diabetic retinopathy; PDR proliferative diabetic retinopathy; CSME clinically significant macular edema; DME diabetic macular edema; dbh dot blot hemorrhages; CWS cotton wool spot; POAG primary open angle glaucoma; C/D cup-to-disc ratio; HVF humphrey visual field; GVF goldmann visual field; OCT optical coherence tomography; IOP intraocular pressure; BRVO Branch retinal vein occlusion; CRVO central retinal vein occlusion; CRAO central retinal artery occlusion; BRAO branch retinal artery occlusion; RT retinal tear; SB scleral buckle; PPV pars plana vitrectomy; VH Vitreous hemorrhage; PRP panretinal laser photocoagulation; IVK intravitreal kenalog; VMT vitreomacular traction; MH Macular hole;  NVD neovascularization of the disc; NVE neovascularization elsewhere; AREDS age related eye disease study; ARMD age related macular degeneration; POAG primary open angle  glaucoma; EBMD epithelial/anterior basement membrane dystrophy; ACIOL anterior chamber intraocular lens; IOL intraocular lens; PCIOL posterior chamber intraocular lens; Phaco/IOL phacoemulsification with intraocular lens placement; [place] photorefractive keratectomy; LASIK laser assisted in situ keratomileusis; HTN hypertension; DM diabetes mellitus; COPD chronic obstructive pulmonary disease

## 2021-08-13 ENCOUNTER — Ambulatory Visit (INDEPENDENT_AMBULATORY_CARE_PROVIDER_SITE_OTHER): Payer: Medicare Other | Admitting: Ophthalmology

## 2021-08-13 ENCOUNTER — Other Ambulatory Visit: Payer: Self-pay

## 2021-08-13 ENCOUNTER — Encounter (INDEPENDENT_AMBULATORY_CARE_PROVIDER_SITE_OTHER): Payer: Self-pay | Admitting: Ophthalmology

## 2021-08-13 DIAGNOSIS — E119 Type 2 diabetes mellitus without complications: Secondary | ICD-10-CM

## 2021-08-13 DIAGNOSIS — H35411 Lattice degeneration of retina, right eye: Secondary | ICD-10-CM | POA: Diagnosis not present

## 2021-08-13 DIAGNOSIS — Z9889 Other specified postprocedural states: Secondary | ICD-10-CM

## 2021-08-13 DIAGNOSIS — H35351 Cystoid macular degeneration, right eye: Secondary | ICD-10-CM

## 2021-08-13 DIAGNOSIS — T8522XS Displacement of intraocular lens, sequela: Secondary | ICD-10-CM

## 2021-08-13 DIAGNOSIS — H2701 Aphakia, right eye: Secondary | ICD-10-CM

## 2021-08-13 DIAGNOSIS — H40051 Ocular hypertension, right eye: Secondary | ICD-10-CM

## 2021-08-13 DIAGNOSIS — Z961 Presence of intraocular lens: Secondary | ICD-10-CM

## 2021-08-13 DIAGNOSIS — H3321 Serous retinal detachment, right eye: Secondary | ICD-10-CM | POA: Diagnosis not present

## 2021-08-13 DIAGNOSIS — H35033 Hypertensive retinopathy, bilateral: Secondary | ICD-10-CM

## 2021-08-13 DIAGNOSIS — I10 Essential (primary) hypertension: Secondary | ICD-10-CM

## 2021-08-15 ENCOUNTER — Encounter (INDEPENDENT_AMBULATORY_CARE_PROVIDER_SITE_OTHER): Payer: Self-pay | Admitting: Ophthalmology

## 2021-08-16 ENCOUNTER — Telehealth: Payer: Self-pay

## 2021-08-16 LAB — MULTIPLE MYELOMA PANEL, SERUM
Albumin SerPl Elph-Mcnc: 3.7 g/dL (ref 2.9–4.4)
Albumin/Glob SerPl: 1.4 (ref 0.7–1.7)
Alpha 1: 0.2 g/dL (ref 0.0–0.4)
Alpha2 Glob SerPl Elph-Mcnc: 0.8 g/dL (ref 0.4–1.0)
B-Globulin SerPl Elph-Mcnc: 0.9 g/dL (ref 0.7–1.3)
Gamma Glob SerPl Elph-Mcnc: 0.8 g/dL (ref 0.4–1.8)
Globulin, Total: 2.7 g/dL (ref 2.2–3.9)
IgA: 112 mg/dL (ref 61–437)
IgG (Immunoglobin G), Serum: 876 mg/dL (ref 603–1613)
IgM (Immunoglobulin M), Srm: 94 mg/dL (ref 15–143)
Total Protein ELP: 6.4 g/dL (ref 6.0–8.5)

## 2021-08-16 NOTE — Progress Notes (Deleted)
PATIENT: Cody Phelps DOB: [birth year]  REASON FOR VISIT: follow up HISTORY FROM: patient  No chief complaint on file.    HISTORY OF PRESENT ILLNESS:  08/16/21 ALL:  ASCENSION STFLEUR is a 81 y.o. 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  HST 04/2021 showed severe complex sleep apnea with AHI 41.1/hr, NON REM AHI 46.3/hr (likely central events) with no significant hypoxemia. Watch pat calculation was 22% total apneas were of central origin. New CPAP was ordered.   Cody [name] mentions he was previously on memantine 5mg  BID. Unclear is concerns of worsening memory, however, memantine was increased to 10mg  BID. No MMSE to compare. CT 11/2019 showed stable atrophy. Pacemaker? MRI  Cody Brett Fairy also mentioned increasing evening dose of ropinirole, however, 2mg  TID dose has been continued. No adjustment in dose ordered at last visit.    HISTORY: (copied from Cody [name]'s previous note)  Cody Phelps is a 80 y.o. year old Caucasian male patient seen here as a referral on 03/10/2021 from Cody [name].  for a .  Chief concern according to patient :  Restless Legs, atrial fib, and CPAP for OSA    I have the pleasure of seeing Cody Phelps today, a right-handed White or Caucasian male with a known sleep disorder.He   has a past medical history of Acute anterior wall MI ([place]) (03/01/2018), Acute combined systolic and diastolic heart failure ([place]) (03/15/2018), Burn, Chronic diastolic CHF (congestive heart failure) ([place]) (09/21/2018), Defect, retina, with detachment, Dementia ([place]), Diverticulosis, ED (erectile dysfunction), First degree heart block, GERD (gastroesophageal reflux disease), Heart murmur, History of blood transfusion, Hypertension, Hypertensive retinopathy, Iron deficiency anemia, Mixed hyperlipidemia, Morbid obesity ([place]), Myocardial infarction ([place]), OA (osteoarthritis), OSA on CPAP, PAF (paroxysmal atrial fibrillation) ([place]), Prostate cancer ([place]), Retinal detachment, RLS  (restless legs syndrome), S/P aortic valve replacement with bioprosthetic valve (05/30/2018), 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   The patient had the first sleep study in the year 2015  with a result of an AHI ( Apnea Hypopnea index)  of 41.8/h and PLM arousals 10/h.  [place] SpO2 was 87%.  The study was a split-night titration and the patient's second part of the night was dedicated to finding the best setting.  He ended up with a setting of 10 cmH2O pressure was 1 cm EPR.  No oxygen desaturations occurred on the CPAP his PLM index helped but it was not completely alleviated so CPAP somehow helped him to have fewer leg movement related arousals but it did not completely take them out.  The AHI under 10 cmH2O was 0.0 the patient was fitted here with the mask and he used a nasal mask is some during the study.   Sleep relevant medical history: The patient feels tired after lunch, he has sciatica keeping him for sitting comfortably. He uses a scooter  outside.  ankle edema,  pericardial fluid collection. Using Lasix  Prn.  The patient has nocturia 4-5 times each night which is also contributed to by his history of prostate cancer and radiation.   Social history:  Patient is retired from [employer] -and lives in a household with spouse persons/ alone. He no longer drives, uses a scooter.  Family status is married , with adult children. Tobacco use: none.  ETOH use; infrequently , Caffeine intake in form of Coffee( 2 cups  AM ) [place]( /) Tea ( decaff) or energy drinks. Vision impairment .    Daughter has CPAP now.    Sleep habits are as follows: The patient's dinner time is between 5-6 PM. The patient goes to bed at 7.30  PM and continues to sleep for intervals of 1-2 hours, wakes for many  bathroom breaks, the first time at 10 PM.   The preferred sleep position  is supine , with the support of 1-2 pillows.  Dreams are reportedly frequent/vivid and he can be combative if woken. Marland Kitchen  7  AM is the usual rise time. The patient wakes up spontaneously.  He reports not feeling refreshed or restored in AM, with symptoms such as dry mouth, stiffness, back pain- ( Cody Phelps ) and residual fatigue. Naps are taken frequently, lasting from 45 to 90 minutes and are more refreshing than nocturnal sleep.   REVIEW OF SYSTEMS: Out of a complete 14 system review of symptoms, the patient complains only of the following symptoms, and all other reviewed systems are negative.  ESS:  ALLERGIES: No Known Allergies  HOME MEDICATIONS: Outpatient Medications Prior to Visit  Medication Sig Dispense Refill   amiodarone (PACERONE) 200 MG tablet Take 200 mg by mouth daily.     atorvastatin (LIPITOR) 80 MG tablet TAKE 1 TABLET BY MOUTH  DAILY 90 tablet 3   brimonidine (ALPHAGAN) 0.2 % ophthalmic solution Place 1 drop into the right eye every 8 (eight) hours. 10 mL 3   Bromfenac Sodium (PROLENSA) 0.07 % SOLN Place 1 drop into the right eye 4 (four) times daily. 3 mL 3   colesevelam (WELCHOL) 625 MG tablet Take 625 mg by mouth 3 (three) times daily.      donepezil (ARICEPT) 10 MG tablet Take 10 mg by mouth every morning.      dorzolamide-timolol (COSOPT) 22.3-6.8 MG/ML ophthalmic solution Place 1 drop into the right eye 2 (two) times daily. 10 mL 3   esomeprazole (NEXIUM) 40 MG capsule Take 40 mg by mouth daily.      ferrous sulfate 325 (65 FE) MG tablet TAKE 1 TABLET BY MOUTH EVERY DAY WITH BREAKFAST 90 tablet 3   gabapentin (NEURONTIN) 300 MG capsule      HYDROcodone bit-homatropine (HYCODAN) 5-1.5 MG/5ML syrup SMARTSIG:5 Milliliter(s) By Mouth Every 12 Hours PRN     insulin glargine (LANTUS) 100 UNIT/ML injection Inject 20-40 Units into the skin at bedtime as needed (BGL below 140-150 take 20 units, if 151 or higher take 40 units).     LAGEVRIO 200 MG CAPS capsule SMARTSIG:4  Capsule(s) By Mouth Every 12 Hours     Loteprednol Etabonate (LOTEMAX SM) 0.38 % GEL Place 1 drop into the right eye 4 (four) times daily. 5 g 3   memantine (NAMENDA) 10 MG tablet Take 1 tablet (10 mg total) by mouth 2 (two) times daily. 180 tablet 1   metoprolol tartrate (LOPRESSOR) 25 MG tablet TAKE 1 TABLET BY MOUTH  DAILY (Patient taking differently: Take 12.5 mg by mouth 2 (two) times daily.) 90 tablet 3   mirabegron ER (MYRBETRIQ) 50 MG TB24 tablet Take 50 mg by mouth at bedtime.      nitroGLYCERIN (NITROSTAT) 0.4 MG SL tablet Place 1 tablet (0.4 mg total) under the tongue every 5 (five) minutes as needed for up to 25 days for chest pain. 25 tablet 3   omega-3 acid ethyl esters (LOVAZA) 1 G capsule Take 2 g by mouth daily.     prednisoLONE acetate (  PRED FORTE) 1 % ophthalmic suspension Place 1 drop into the right eye 4 (four) times daily. 15 mL 0   rOPINIRole (REQUIP) 2 MG tablet Take 2 mg by mouth 3 (three) times daily as needed (RLS).      spironolactone (ALDACTONE) 25 MG tablet TAKE ONE-HALF TABLET BY  MOUTH DAILY 45 tablet 3   tamsulosin (FLOMAX) 0.4 MG CAPS capsule Take 0.4 mg by mouth daily after supper.      torsemide (DEMADEX) 20 MG tablet Take 1 tablet (20 mg total) by mouth as directed. 2 times a week on Tuesday and Thursday.  Take additional dose as directed with weight gain of 2 Lbs 90 tablet 1   valsartan-hydrochlorothiazide (DIOVAN-HCT) 80-12.5 MG tablet TAKE 1 TABLET BY MOUTH IN  THE MORNING 90 tablet 3   vitamin B-12 (CYANOCOBALAMIN) 1000 MCG tablet Take 1,000 mcg by mouth 2 (two) times daily.     XARELTO 15 MG TABS tablet TAKE 1 TABLET BY MOUTH IN  THE EVENING AFTER DINNER 90 tablet 3   No facility-administered medications prior to visit.    PAST MEDICAL HISTORY: Past Medical History:  Diagnosis Date   Acute anterior wall MI ([place]) 03/01/2018   Acute combined systolic and diastolic heart failure ([place]) 03/15/2018   Burn    Chronic diastolic CHF (congestive heart failure)  ([place]) 09/21/2018   Defect, retina, with detachment    Dementia ([place])    Diverticulosis    ED (erectile dysfunction)    First degree heart block    GERD (gastroesophageal reflux disease)    Heart murmur    History of blood transfusion    with open heart surgery   Hypertension    cardiologsit-  Cody [name]   Hypertensive retinopathy    OU   Iron deficiency anemia    Mixed hyperlipidemia    Morbid obesity ([place])    Myocardial infarction ([place])    02/28/18   OA (osteoarthritis)    right hip   OSA on CPAP    followed by Cody dohmeier, uses CPAP   PAF (paroxysmal atrial fibrillation) ([place])    a. on Eliquis   Prostate cancer ([place])    dx 03-17-2017 (bx)  Stage T2a, Gleason 4+4, PSA  4.43, vol 32.32--  s/p radiatctive prostate seed implants 07-10-2017 then IMRT and ADT   Retinal detachment    Rheg. 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   LIMA to Diagonal Branch   S/P Maze operation for atrial fibrillation 05/30/2018   Complete bilateral atrial lesion set using bipolar radiofrequency and cryothermy ablation with clipping of LA appendage   Scoliosis    Trigger finger    Type 2 diabetes mellitus ([place])    Wears partial dentures    upper and lower    PAST SURGICAL HISTORY: Past Surgical History:  Procedure Laterality Date   AORTIC VALVE REPLACEMENT N/A 05/30/2018   Procedure: AORTIC VALVE REPLACEMENT (AVR) using Inspiris Valve, Size 23;  Surgeon: [name], MD;  Location: [place];  Service: Open Heart Surgery;  Laterality: N/A;   APPENDECTOMY  1988   BILATERAL TOTAL ETHMOIDECTOMY AND SPHENOIDECTOMY  01-14-2009    Cody Redmond Baseman   [facility]   CARDIOVERSION N/A 08/10/2018   Procedure: CARDIOVERSION;  Surgeon: Adrian Prows, MD;  Location: [place];  Service: Cardiovascular;  Laterality: N/A;  CARPAL TUNNEL RELEASE Bilateral 2013    CATARACT EXTRACTION Bilateral    Cody. [name]   CATARACT EXTRACTION W/ INTRAOCULAR LENS  IMPLANT, BILATERAL  date?   CORONARY ARTERY BYPASS GRAFT N/A 05/30/2018   CORONARY BALLOON ANGIOPLASTY N/A 03/01/2018   Procedure: CORONARY BALLOON ANGIOPLASTY;  Surgeon: Adrian Prows, MD;  Location: [place] [facility];  Service: Cardiovascular;  Laterality: N/A;   CORONARY/GRAFT ACUTE MI REVASCULARIZATION N/A 03/01/2018   Procedure: Coronary/Graft Acute MI Revascularization;  Surgeon: Adrian Prows, MD;  Location: [place] [facility];  Service: Cardiovascular;  Laterality: N/A;   CYSTOSCOPY  07/19/2017   Procedure: CYSTOSCOPY;  Surgeon: Nickie Retort, MD;  Location[facility];  Service: Urology;;  No seeds found in [place] Bilateral    Cat Sx OU.  Dislocated IOL sx OD.  Rheg. RD repair OS.   EYE SURGERY     GAS/FLUID EXCHANGE Right 06/13/2019   Procedure: GAS/FLUID EXCHANGE;  Surgeon: [name], MD;  Location: [place];  Service: Ophthalmology;  Laterality: Right;   LAMINECTOMY WITH POSTERIOR LATERAL ARTHRODESIS LEVEL 2 N/A 02/12/2014   Procedure: LUMBAR TWO-THREE,LUIMBAR THREE-FOUR LAMINECTOMY/FORAMINOTOMY;POSSIBLE POSTEROLATERAL ARTHRODESIS WITH AUTOGRAFT;  Surgeon: [name], MD;  [facility]: [facility];  Service: Neurosurgery;  Laterality: N/A;   LEFT HEART CATH AND CORONARY ANGIOGRAPHY N/A 03/01/2018   Procedure: LEFT HEART CATH AND CORONARY ANGIOGRAPHY;  Surgeon: Adrian Prows, MD;  Location: [place] [facility];  Service: Cardiovascular;  Laterality: N/A;   MAZE N/A 05/30/2018   Procedure: MAZE;  Surgeon: [name], MD;  Location: [place];  Service: Open Heart Surgery;  Laterality: N/A;   ORIF RIGHT ANKLE FX'S  05/05/2001   retained hardware   PARS PLANA VITRECTOMY Right 06/13/2019   Procedure: PARS PLANA VITRECTOMY WITH 25 GAUGE WITH LASER AND GAS;  Surgeon: [name], MD;  Location: [place];  Service: Ophthalmology;  Laterality: Right;   PARS PLANA VITRECTOMY  Right 09/12/2019   Procedure: Pars Plana Vitrectomy With 25 Gauge;  Surgeon: [name], MD;  Location: [place];  Service: Ophthalmology;  Laterality: Right;   PERFLUORONE INJECTION Right 06/13/2019   Procedure: PERFLUORONE INJECTION;  Surgeon: [name], MD;  Location: [place];  Service: Ophthalmology;  Laterality: Right;   PHOTOCOAGULATION WITH LASER Right 06/13/2019   Procedure: PHOTOCOAGULATION WITH LASER;  Surgeon: [name], MD;  Location: [place];  Service: Ophthalmology;  Laterality: Right;   PLACEMENT AND SUTURE OF SECONDARY INTRAOCULAR LENS Right 09/12/2019   Procedure: PLACEMENT AND SUTURE OF SECONDARY INTRAOCULAR LENS;  Surgeon: [name], MD;  Location: [place];  Service: Ophthalmology;  Laterality: Right;   PROSTATE BIOPSY  03-17-2017   Cody Pilar Jarvis office   RADIOACTIVE SEED IMPLANT N/A 07/19/2017   Procedure: RADIOACTIVE SEED IMPLANT/BRACHYTHERAPY IMPLANT;  Surgeon: Nickie Retort, MD;  Location[facility];  Service: Urology;  Laterality: N/A;  69 seeds implanted   REMOVAL RETAINED LENS Right 09/12/2019   Procedure: REMOVAL RETAINED LENS;  Surgeon: [name], MD;  Location: [place];  Service: Ophthalmology;  Laterality: Right;   RETINAL DETACHMENT SURGERY Right 06/13/2019   Cody. Coralyn Pear   SKIN GRAFT     face   SPACE OAR INSTILLATION N/A 07/19/2017   Procedure: SPACE OAR INSTILLATION;  Surgeon: Nickie Retort, MD;  Location[facility];  Service: Urology;  Laterality: N/A;   TEE WITHOUT CARDIOVERSION  03/20/2012   Procedure: TRANSESOPHAGEAL ECHOCARDIOGRAM (TEE);  Surgeon: Laverda Page, MD;  [facility];  Service: Cardiovascular;  Laterality: N/A;  normal LV; normal EF; normal RV; normal LA w/ left atrial appendage very small, normal function, interatrial septum intact without defect; normal RA; trace MR,TR, & PI; mild AV calcification and senile degeneration w/ mild stenosis, AVA 1.7cm^2;;    TEE WITHOUT CARDIOVERSION N/A  05/30/2018   Procedure: TRANSESOPHAGEAL ECHOCARDIOGRAM (TEE);  Surgeon: [name], MD;  Location: [place];  Service: Open Heart Surgery;  Laterality: N/A;   TOTAL HIP ARTHROPLASTY Right 10/23/2017   Procedure: TOTAL HIP ARTHROPLASTY ANTERIOR APPROACH;  Surgeon: Frederik Pear, MD;  Location: [place];  Service: Orthopedics;  Laterality: Right;   TRANSTHORACIC ECHOCARDIOGRAM  01-11-2017   Cody [name]  (per echo note, no significant change in seveity of AS, no other diagnostic change)   moderate concentric LVH, ef 62%, grade 1 diastolic dysfunction/  moderate LAE/  mild , grade 1 AR w/ moderate AV calcification, mild to moderate restricted AV leaflets w/ moderate AS, AVA 1.16cm^2, peak grandient 69mmHg, mean grandient 14mmHg/  trace MR, mild calcification MV annulus , mild MV leaflet calcification, mild MVS, peak grandient 4.61mmHg, mean grandient 2.61mmHg/ trace TR    FAMILY HISTORY: Family History  Problem Relation Age of Onset   Stroke Mother 34   Hypertension Mother    Hypertension Father    Heart disease Father    Heart attack Father 1   Heart murmur Father    Hypertension Sister    Lung disease Brother    Colon cancer Neg Hx    Stomach cancer Neg Hx    Rectal cancer Neg Hx    Pancreatic cancer Neg Hx     SOCIAL HISTORY: Social History   Socioeconomic History   Marital status: Married    Spouse name: Butch Penny   Number of children: 2   Years of education: 14   Highest education level: Not on file  Occupational History   Not on file  Tobacco Use   Smoking status: Former    Packs/day: 0.25    Years: 1.00    Pack years: 0.25    Types: Cigarettes    Quit date: 02/04/1967    Years since quitting: 54.5   Smokeless tobacco: Never   Tobacco comments:    smoked 1 q2-3 days  Vaping Use   Vaping Use: Never used  Substance and Sexual Activity   Alcohol use: Yes    Comment: occasional beer   Drug use: No   Sexual activity: Yes  Other Topics Concern   Not on file  Social History  Narrative   Patient is married Butch Penny).   Patient has two children.   Patient is retired.   Patient has a college education.   Patient is right-handed.   Patient drinks two cups of coffee per day and 2-3 cups of Diet soda daily and limited tea.         Social Determinants of Health   Financial Resource Strain: Not on file  Food Insecurity: Not on file  Transportation Needs: Not on file  Physical Activity: Not on file  Stress: Not on file  Social Connections: Not on file  Intimate Partner Violence: Not on file     PHYSICAL EXAM  There were no vitals filed for this visit. There is no height or weight on file to calculate BMI.  Generalized: Well developed, in no acute distress  Cardiology: normal rate and rhythm, no murmur noted Respiratory: clear to auscultation bilaterally  Neurological examination  Mentation: Alert oriented to  time, place, history taking. Follows all commands speech and language fluent Cranial nerve II-XII: Pupils were equal round reactive to light. Extraocular movements were full, visual field were full  Motor: The motor testing reveals 5 Cody 5 strength of all 4 extremities. Good symmetric motor tone is noted throughout.  Gait and station: Gait is normal.    DIAGNOSTIC DATA (LABS, IMAGING, TESTING) - I reviewed patient records, labs, notes, testing and imaging myself where available.  No flowsheet data found.   Lab Results  Component Value Date   WBC 7.0 08/10/2021   HGB 9.7 (L) 08/10/2021   HCT 28.7 (L) 08/10/2021   MCV 87.0 08/10/2021   PLT 172 08/10/2021      Component Value Date/Time   NA 141 03/03/2021 1605   K 4.5 03/03/2021 1605   CL 105 03/03/2021 1605   CO2 22 03/03/2021 1605   GLUCOSE 106 (H) 03/03/2021 1605   GLUCOSE 134 (H) 09/12/2019 0956   BUN 17 03/03/2021 1605   CREATININE 1.18 03/03/2021 1605   CALCIUM 9.3 03/03/2021 1605   PROT 7.0 09/15/2020 1154   ALBUMIN 4.4 09/15/2020 1154   AST 18 09/15/2020 1154   ALT 21  09/15/2020 1154   ALKPHOS 17 (L) 09/15/2020 1154   BILITOT 0.4 09/15/2020 1154   GFRNONAA 59 (L) 09/15/2020 1154   GFRAA 68 09/15/2020 1154   Lab Results  Component Value Date   CHOL 142 09/15/2020   HDL 53 09/15/2020   LDLCALC 70 09/15/2020   TRIG 101 09/15/2020   CHOLHDL 2.7 03/01/2018   Lab Results  Component Value Date   HGBA1C 5.8 (H) 05/28/2018   Lab Results  Component Value Date   VITAMINB12 354 08/10/2021   Lab Results  Component Value Date   TSH 1.380 09/15/2020     ASSESSMENT AND PLAN 81 y.o. year old male  has a past medical history of Acute anterior wall MI ([place]) (03/01/2018), Acute combined systolic and diastolic heart failure ([place]) (03/15/2018), Burn, Chronic diastolic CHF (congestive heart failure) ([place]) (09/21/2018), Defect, retina, with detachment, Dementia ([place]), Diverticulosis, ED (erectile dysfunction), First degree heart block, GERD (gastroesophageal reflux disease), Heart murmur, History of blood transfusion, Hypertension, Hypertensive retinopathy, Iron deficiency anemia, Mixed hyperlipidemia, Morbid obesity ([place]), Myocardial infarction ([place]), OA (osteoarthritis), OSA on CPAP, PAF (paroxysmal atrial fibrillation) ([place]), Prostate cancer ([place]), Retinal detachment, RLS (restless legs syndrome), S/P aortic valve replacement with bioprosthetic valve (05/30/2018), 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 here with     ICD-10-CM   1. Complex sleep apnea syndrome  G47.31     2. Sleep apnea with use of continuous positive airway pressure (CPAP)  G47.30        MCKENNON ZWART is doing well on CPAP therapy. Compliance report reveals ***. *** was encouraged to continue using CPAP nightly and for greater than 4 hours each night. Risks of untreated sleep apnea review and education materials provided. Healthy lifestyle habits encouraged. He has  tolerated increased dose of memantine. MMSE /30. No previous result for comparison. CT 11/2019 showed stable atrophy. No Will have him follow up with Cody Brett Fairy at next visit to discuss any other additional workup that may be needed.   He will follow up in ***, sooner if needed. He verbalizes understanding and agreement with this plan.    No orders of the defined types were placed in this encounter.    No  orders of the defined types were placed in this encounter.     Debbora Presto, FNP-C 08/16/2021, 4:34 PM [facility] [address], [place], [place] [address] [phone]

## 2021-08-16 NOTE — Telephone Encounter (Signed)
Called and spoke to pts wife (on [place] and patient in the background) regarding his appt for initial CPAP f/u. Pt has not received his new CPAP. They have reached out to [facility] 2x, last being October and were told that they were still on backorder. Informed pt to reach out to [facility] and f/u. I  provided [facility] phone number. I informed pt that they can keep tomorrows appt and discuss pts memory concerns but wife and pt would like to wait till he comes in for his initial CPAP f/u and f/u for both concerns then.   Informed pt to call our office when he gets his new CPAP so we can schedule his f/u appt. Wife and pt verbalized understanding and had no further questions at this time.

## 2021-08-16 NOTE — Patient Instructions (Incomplete)
Please continue using your CPAP regularly. While your insurance requires that you use CPAP at least 4 hours each night on 70% of the nights, I recommend, that you not skip any nights and use it throughout the night if you can. Getting used to CPAP and staying with the treatment long term does take time and patience and discipline. Untreated obstructive sleep apnea when it is moderate to severe can have an adverse impact on cardiovascular health and raise her risk for heart disease, arrhythmias, hypertension, congestive heart failure, stroke and diabetes. Untreated obstructive sleep apnea causes sleep disruption, nonrestorative sleep, and sleep deprivation. This can have an impact on your day to day functioning and cause daytime sleepiness and impairment of cognitive function, memory loss, mood disturbance, and problems focussing. Using CPAP regularly can improve these symptoms.  Continue ropinirole as directed by PCP  Continue memantine 10mg  twice daily.   Follow up with Dr [name]   Management of Memory Problems   There are some general things you can do to help manage your memory problems.  Your memory may not in fact recover, but by using techniques and strategies you will be able to manage your memory difficulties better.   1)  Establish a routine. Try to establish and then stick to a regular routine.  By doing this, you will get used to what to expect and you will reduce the need to rely on your memory.  Also, try to do things at the same time of day, such as taking your medication or checking your calendar first thing in the morning. Think about think that you can do as a part of a regular routine and make a list.  Then enter them into a daily planner to remind you.  This will help you establish a routine.   2)  Organize your environment. Organize your environment so that it is uncluttered.  Decrease visual stimulation.  Place everyday items such as keys or cell phone in the same place every  day (ie.  Basket next to front door) Use post it notes with a brief message to yourself (ie. Turn off light, lock the door) Use labels to indicate where things go (ie. Which cupboards are for food, dishes, etc.) Keep a notepad and pen by the telephone to take messages   3)  Memory Aids A diary or journal/notebook/daily planner Making a list (shopping list, chore list, to do list that needs to be done) Using an alarm as a reminder (kitchen timer or cell phone alarm) Using cell phone to store information (Notes, Calendar, Reminders) Calendar/White board placed in a prominent position Post-it notes   In order for memory aids to be useful, you need to have good habits.  It's no good remembering to make a note in your journal if you don't remember to look in it.  Try setting aside a certain time of day to look in journal.   4)  Improving mood and managing fatigue. There may be other factors that contribute to memory difficulties.  Factors, such as anxiety, depression and tiredness can affect memory. Regular gentle exercise can help improve your mood and give you more energy. Simple relaxation techniques may help relieve symptoms of anxiety Try to get back to completing activities or hobbies you enjoyed doing in the past. Learn to pace yourself through activities to decrease fatigue. Find out about some local support groups where you can share experiences with others. Try and achieve 7-8 hours of sleep at night.

## 2021-08-17 ENCOUNTER — Ambulatory Visit: Payer: Medicare Other | Admitting: Family Medicine

## 2021-08-17 NOTE — Telephone Encounter (Signed)
Cody Phelps, Cody Phelps, Cody Phelps; Cody Phelps  Patient is scheduled to come in on 01/26 @ 1:30 PM

## 2021-08-23 ENCOUNTER — Other Ambulatory Visit: Payer: Self-pay

## 2021-08-23 ENCOUNTER — Ambulatory Visit (INDEPENDENT_AMBULATORY_CARE_PROVIDER_SITE_OTHER): Payer: Medicare Other | Admitting: Pulmonary Disease

## 2021-08-23 DIAGNOSIS — G4733 Obstructive sleep apnea (adult) (pediatric): Secondary | ICD-10-CM

## 2021-08-23 DIAGNOSIS — Z8679 Personal history of other diseases of the circulatory system: Secondary | ICD-10-CM | POA: Diagnosis not present

## 2021-08-23 DIAGNOSIS — Z9889 Other specified postprocedural states: Secondary | ICD-10-CM | POA: Diagnosis not present

## 2021-08-23 LAB — PULMONARY FUNCTION TEST
DL/VA % pred: 83 %
DL/VA: 3.28 ml/min/mmHg/L
DLCO cor % pred: 71 %
DLCO cor: 17.05 ml/min/mmHg
DLCO unc % pred: 58 %
DLCO unc: 14.12 ml/min/mmHg
FEF 25-75 Post: 2.89 L/sec
FEF 25-75 Pre: 1.96 L/sec
FEF2575-%Change-Post: 47 %
FEF2575-%Pred-Post: 145 %
FEF2575-%Pred-Pre: 99 %
FEV1-%Change-Post: 9 %
FEV1-%Pred-Post: 91 %
FEV1-%Pred-Pre: 83 %
FEV1-Post: 2.59 L
FEV1-Pre: 2.36 L
FEV1FVC-%Change-Post: 3 %
FEV1FVC-%Pred-Pre: 105 %
FEV6-%Change-Post: 5 %
FEV6-%Pred-Post: 88 %
FEV6-%Pred-Pre: 84 %
FEV6-Post: 3.25 L
FEV6-Pre: 3.1 L
FEV6FVC-%Pred-Post: 107 %
FEV6FVC-%Pred-Pre: 107 %
FVC-%Change-Post: 6 %
FVC-%Pred-Post: 83 %
FVC-%Pred-Pre: 78 %
FVC-Post: 3.29 L
FVC-Pre: 3.1 L
Post FEV1/FVC ratio: 79 %
Post FEV6/FVC ratio: 100 %
Pre FEV1/FVC ratio: 76 %
Pre FEV6/FVC Ratio: 100 %
RV % pred: 77 %
RV: 1.98 L
TLC % pred: 85 %
TLC: 5.84 L

## 2021-08-23 NOTE — Patient Instructions (Signed)
Full PFT performed today. °

## 2021-08-23 NOTE — Progress Notes (Signed)
Full PFT performed today. °

## 2021-08-24 ENCOUNTER — Ambulatory Visit: Payer: Medicare Other | Admitting: Internal Medicine

## 2021-08-27 ENCOUNTER — Ambulatory Visit: Payer: Medicare Other | Admitting: Internal Medicine

## 2021-09-03 ENCOUNTER — Encounter (HOSPITAL_BASED_OUTPATIENT_CLINIC_OR_DEPARTMENT_OTHER): Payer: Self-pay | Admitting: *Deleted

## 2021-09-03 ENCOUNTER — Emergency Department (HOSPITAL_BASED_OUTPATIENT_CLINIC_OR_DEPARTMENT_OTHER): Payer: Medicare Other

## 2021-09-03 ENCOUNTER — Other Ambulatory Visit: Payer: Self-pay

## 2021-09-03 ENCOUNTER — Telehealth: Payer: Self-pay | Admitting: Internal Medicine

## 2021-09-03 ENCOUNTER — Encounter: Payer: Self-pay | Admitting: Internal Medicine

## 2021-09-03 ENCOUNTER — Emergency Department (HOSPITAL_BASED_OUTPATIENT_CLINIC_OR_DEPARTMENT_OTHER)
Admission: EM | Admit: 2021-09-03 | Discharge: 2021-09-04 | Disposition: A | Discharge status: Home / Self Care | Payer: Medicare Other | Attending: Emergency Medicine | Admitting: Emergency Medicine

## 2021-09-03 ENCOUNTER — Ambulatory Visit (INDEPENDENT_AMBULATORY_CARE_PROVIDER_SITE_OTHER): Payer: Medicare Other | Admitting: Internal Medicine

## 2021-09-03 VITALS — BP 132/64 | HR 63 | Ht 68.0 in | Wt 211.2 lb

## 2021-09-03 DIAGNOSIS — R799 Abnormal finding of blood chemistry, unspecified: Secondary | ICD-10-CM | POA: Insufficient documentation

## 2021-09-03 DIAGNOSIS — Z7901 Long term (current) use of anticoagulants: Secondary | ICD-10-CM | POA: Insufficient documentation

## 2021-09-03 DIAGNOSIS — Z87891 Personal history of nicotine dependence: Secondary | ICD-10-CM | POA: Diagnosis not present

## 2021-09-03 DIAGNOSIS — J849 Interstitial pulmonary disease, unspecified: Secondary | ICD-10-CM

## 2021-09-03 DIAGNOSIS — R5383 Other fatigue: Secondary | ICD-10-CM | POA: Diagnosis not present

## 2021-09-03 DIAGNOSIS — Z79899 Other long term (current) drug therapy: Secondary | ICD-10-CM | POA: Diagnosis not present

## 2021-09-03 DIAGNOSIS — Z794 Long term (current) use of insulin: Secondary | ICD-10-CM | POA: Insufficient documentation

## 2021-09-03 DIAGNOSIS — Z955 Presence of coronary angioplasty implant and graft: Secondary | ICD-10-CM | POA: Insufficient documentation

## 2021-09-03 DIAGNOSIS — I4891 Unspecified atrial fibrillation: Secondary | ICD-10-CM | POA: Diagnosis not present

## 2021-09-03 DIAGNOSIS — I2510 Atherosclerotic heart disease of native coronary artery without angina pectoris: Secondary | ICD-10-CM | POA: Insufficient documentation

## 2021-09-03 DIAGNOSIS — R42 Dizziness and giddiness: Secondary | ICD-10-CM

## 2021-09-03 DIAGNOSIS — R55 Syncope and collapse: Secondary | ICD-10-CM | POA: Diagnosis not present

## 2021-09-03 DIAGNOSIS — I10 Essential (primary) hypertension: Secondary | ICD-10-CM | POA: Diagnosis not present

## 2021-09-03 DIAGNOSIS — Z8669 Personal history of other diseases of the nervous system and sense organs: Secondary | ICD-10-CM

## 2021-09-03 DIAGNOSIS — Z862 Personal history of diseases of the blood and blood-forming organs and certain disorders involving the immune mechanism: Secondary | ICD-10-CM | POA: Diagnosis not present

## 2021-09-03 DIAGNOSIS — R0609 Other forms of dyspnea: Secondary | ICD-10-CM | POA: Diagnosis not present

## 2021-09-03 LAB — CBC WITH DIFFERENTIAL/PLATELET
Abs Immature Granulocytes: 0.03 10*3/uL (ref 0.00–0.07)
Basophils Absolute: 0.1 10*3/uL (ref 0.0–0.1)
Basophils Absolute: 0.1 10*3/uL (ref 0.0–0.1)
Basophils Relative: 1 % (ref 0.0–3.0)
Basophils Relative: 1 %
Eosinophils Absolute: 0.3 10*3/uL (ref 0.0–0.7)
Eosinophils Absolute: 0.4 10*3/uL (ref 0.0–0.5)
Eosinophils Relative: 5.1 % — ABNORMAL HIGH (ref 0.0–5.0)
Eosinophils Relative: 6 %
HCT: 31.8 % — ABNORMAL LOW (ref 39.0–52.0)
HCT: 32.2 % — ABNORMAL LOW (ref 39.0–52.0)
Hemoglobin: 10.6 g/dL — ABNORMAL LOW (ref 13.0–17.0)
Hemoglobin: 10.9 g/dL — ABNORMAL LOW (ref 13.0–17.0)
Immature Granulocytes: 0 %
Lymphocytes Relative: 19.3 % (ref 12.0–46.0)
Lymphocytes Relative: 23 %
Lymphs Abs: 1.3 10*3/uL (ref 0.7–4.0)
Lymphs Abs: 1.6 10*3/uL (ref 0.7–4.0)
MCH: 29.1 pg (ref 26.0–34.0)
MCHC: 33.5 g/dL (ref 30.0–36.0)
MCHC: 33.9 g/dL (ref 30.0–36.0)
MCV: 86.4 fl (ref 78.0–100.0)
MCV: 86.1 fL (ref 80.0–100.0)
Monocytes Absolute: 0.6 10*3/uL (ref 0.1–1.0)
Monocytes Absolute: 0.6 10*3/uL (ref 0.1–1.0)
Monocytes Relative: 8.9 % (ref 3.0–12.0)
Monocytes Relative: 9 %
Neutro Abs: 4.5 10*3/uL (ref 1.4–7.7)
Neutro Abs: 4.2 10*3/uL (ref 1.7–7.7)
Neutrophils Relative %: 65.7 % (ref 43.0–77.0)
Neutrophils Relative %: 61 %
Platelets: 226 10*3/uL (ref 150.0–400.0)
Platelets: 225 10*3/uL (ref 150–400)
RBC: 3.68 Mil/uL — ABNORMAL LOW (ref 4.22–5.81)
RBC: 3.74 MIL/uL — ABNORMAL LOW (ref 4.22–5.81)
RDW: 14.4 % (ref 11.5–15.5)
RDW: 13.5 % (ref 11.5–15.5)
WBC: 6.8 10*3/uL (ref 4.0–10.5)
WBC: 6.9 10*3/uL (ref 4.0–10.5)
nRBC: 0 % (ref 0.0–0.2)

## 2021-09-03 LAB — HEPATIC FUNCTION PANEL
ALT: 10 U/L (ref 0–53)
AST: 14 U/L (ref 0–37)
Albumin: 4.3 g/dL (ref 3.5–5.2)
Alkaline Phosphatase: 51 U/L (ref 39–117)
Bilirubin, Direct: 0 mg/dL (ref 0.0–0.3)
Total Bilirubin: 0.4 mg/dL (ref 0.2–1.2)
Total Protein: 7.1 g/dL (ref 6.0–8.3)

## 2021-09-03 LAB — BASIC METABOLIC PANEL
Anion gap: 9 (ref 5–15)
BUN: 20 mg/dL (ref 6–23)
BUN: 20 mg/dL (ref 8–23)
CO2: 28 mEq/L (ref 19–32)
CO2: 25 mmol/L (ref 22–32)
Calcium: 9.6 mg/dL (ref 8.4–10.5)
Calcium: 9.3 mg/dL (ref 8.9–10.3)
Chloride: 104 mEq/L (ref 96–112)
Chloride: 103 mmol/L (ref 98–111)
Creatinine, Ser: 1.29 mg/dL (ref 0.40–1.50)
Creatinine, Ser: 1.22 mg/dL (ref 0.61–1.24)
GFR, Estimated: >60 mL/min (ref >60)
GFR: 53.01 mL/min — ABNORMAL LOW (ref >60.00)
Glucose, Bld: 102 mg/dL — ABNORMAL HIGH (ref 70–99)
Glucose, Bld: 110 mg/dL — ABNORMAL HIGH (ref 70–99)
Potassium: 4.2 mEq/L (ref 3.5–5.1)
Potassium: 3.6 mmol/L (ref 3.5–5.1)
Sodium: 138 mEq/L (ref 135–145)
Sodium: 137 mmol/L (ref 135–145)

## 2021-09-03 LAB — TROPONIN I (HIGH SENSITIVITY)
High Sens Troponin I: 46 ng/L (ref 2–17)
Troponin I (High Sensitivity): 40 ng/L — ABNORMAL HIGH (ref <18)
Troponin I (High Sensitivity): 39 ng/L — ABNORMAL HIGH (ref <18)

## 2021-09-03 LAB — D-DIMER, QUANTITATIVE: D-Dimer, Quant: 3.39 ug/mL-FEU — ABNORMAL HIGH (ref 0.00–0.50)

## 2021-09-03 LAB — MAGNESIUM: Magnesium: 1.9 mg/dL (ref 1.7–2.4)

## 2021-09-03 LAB — TSH: TSH: 0.66 u[IU]/mL (ref 0.350–4.500)

## 2021-09-03 LAB — BRAIN NATRIURETIC PEPTIDE
B Natriuretic Peptide: 74.3 pg/mL (ref 0.0–100.0)
Pro B Natriuretic peptide (BNP): 86 pg/mL (ref 0.0–100.0)

## 2021-09-03 LAB — PROTIME-INR
INR: 1.9 ratio — ABNORMAL HIGH (ref 0.8–1.0)
Prothrombin Time: 19.6 s — ABNORMAL HIGH (ref 9.6–13.1)

## 2021-09-03 MED ORDER — IOHEXOL 350 MG/ML SOLN
100.0000 mL | Freq: Once | INTRAVENOUS | Status: AC | PRN
  Administered 2021-09-03: 100 mL via INTRAVENOUS

## 2021-09-03 NOTE — ED Provider Notes (Signed)
[facility] DEPART[facility] Provider Note   CSN: [id number] Arrival date & time: 09/03/21  1735     History  Chief Complaint  Patient presents with   Abnormal Lab    Cody Phelps is a 81 y.o. male.     81 year old male with past medical history of HTN, HLD, atrial fibrillation anticoagulated on Xarelto, CAD status post CABG presents to the emergency department with concern for abnormal outpatient blood work.  Over the past week patient has had 2 near syncopal episodes.  They were seen by their pulmonologist and had outpatient blood work done.  They were called stating that the troponin was abnormal so he was brought in for evaluation.  Patient denies any chest pain, dizziness, shortness of breath, swelling of his lower extremity.  He endorses mild fatigue but no recent illness, fever, cough, GI symptoms.  He has otherwise been compliant with his medications.  Home Medications Prior to Admission medications   Medication Sig Start Date End Date Taking? Authorizing Provider  amiodarone (PACERONE) 200 MG tablet Take 200 mg by mouth daily.    [provider]  atorvastatin (LIPITOR) 80 MG tablet TAKE 1 TABLET BY MOUTH  DAILY 10/04/19   Adrian Prows, MD  brimonidine (ALPHAGAN) 0.2 % ophthalmic solution Place 1 drop into the right eye every 8 (eight) hours. 07/28/21   [name], MD  Bromfenac Sodium (PROLENSA) 0.07 % SOLN Place 1 drop into the right eye 4 (four) times daily. 05/05/21   [name], MD  colesevelam [facility]) 625 MG tablet Take 625 mg by mouth 3 (three) times daily.     [provider]  donepezil (ARICEPT) 10 MG tablet Take 10 mg by mouth every morning.  03/19/12   [provider]  dorzolamide-timolol (COSOPT) 22.3-6.8 MG/ML ophthalmic solution Place 1 drop into the right eye 2 (two) times daily. 07/28/21   [name], MD  esomeprazole (NEXIUM) 40 MG capsule Take 40 mg by mouth daily.  06/18/19   [provider]  ferrous  sulfate 325 (65 FE) MG tablet TAKE 1 TABLET BY MOUTH EVERY DAY WITH BREAKFAST 12/21/20   Wyatt Portela, MD  gabapentin (NEURONTIN) 300 MG capsule  04/21/20   [provider]  HYDROcodone bit-homatropine (HYCODAN) 5-1.5 MG/5ML syrup SMARTSIG:5 Milliliter(s) By Mouth Every 12 Hours PRN 06/21/21   [provider]  insulin glargine (LANTUS) 100 UNIT/ML injection Inject 20-40 Units into the skin at bedtime as needed (BGL below 140-150 take 20 units, if 151 or higher take 40 units).    [provider]  Loteprednol Etabonate (LOTEMAX SM) 0.38 % GEL Place 1 drop into the right eye 4 (four) times daily. 07/28/21   [name], MD  memantine (NAMENDA) 10 MG tablet Take 1 tablet (10 mg total) by mouth 2 (two) times daily. 04/26/21   [name], Asencion Partridge, MD  metoprolol tartrate (LOPRESSOR) 25 MG tablet TAKE 1 TABLET BY MOUTH  DAILY Patient taking differently: Take 12.5 mg by mouth 2 (two) times daily. 03/16/20   Adrian Prows, MD  mirabegron ER (MYRBETRIQ) 50 MG TB24 tablet Take 50 mg by mouth at bedtime.     [provider]  nitroGLYCERIN (NITROSTAT) 0.4 MG SL tablet Place 1 tablet (0.4 mg total) under the tongue every 5 (five) minutes as needed for up to 25 days for chest pain. 03/03/21 04/28/21  Adrian Prows, MD  omega-3 acid ethyl esters (LOVAZA) 1 G capsule Take 2 g by mouth daily.    [provider]  rOPINIRole (REQUIP) 2 MG tablet Take 2 mg by mouth 3 (three) times daily as needed (RLS).     [provider]  spironolactone (ALDACTONE) 25 MG tablet TAKE ONE-HALF TABLET BY  MOUTH DAILY 07/27/20   Adrian Prows, MD  tamsulosin (FLOMAX) 0.4 MG CAPS capsule Take 0.4 mg by mouth daily after supper.     [provider]  torsemide (DEMADEX) 20 MG tablet Take 1 tablet (20 mg total) by mouth as directed. 2 times a week on Tuesday and Thursday.  Take additional dose as directed with weight gain of 2 Lbs 03/03/21 08/30/21  Adrian Prows, MD  valsartan-hydrochlorothiazide  (DIOVAN-HCT) 80-12.5 MG tablet TAKE 1 TABLET BY MOUTH IN  THE MORNING 04/06/21   Adrian Prows, MD  vitamin B-12 (CYANOCOBALAMIN) 1000 MCG tablet Take 1,000 mcg by mouth 2 (two) times daily.    [provider]  XARELTO 15 MG TABS tablet TAKE 1 TABLET BY MOUTH IN  THE EVENING AFTER DINNER 05/14/21   Adrian Prows, MD      Allergies    Patient has no known allergies.    Review of Systems   Review of Systems  Constitutional:  Positive for fatigue. Negative for fever.  Respiratory:  Negative for chest tightness and shortness of breath.   Cardiovascular:  Negative for chest pain, palpitations and leg swelling.  Gastrointestinal:  Negative for abdominal pain, diarrhea and vomiting.  Skin:  Negative for rash.  Neurological:  Positive for syncope. Negative for headaches.   Physical Exam Updated Vital Signs BP (!) 158/65    Pulse 61    Temp 98.1 F (36.7 C) (Oral)    Resp 19    Ht 5\' 8"  (1.727 m)    Wt 95.8 kg    SpO2 99%    BMI 32.11 kg/m  Physical Exam Vitals and nursing note reviewed.  Constitutional:      General: He is not in acute distress.    Appearance: Normal appearance. He is not diaphoretic.  HENT:     Head: Normocephalic.     Mouth/Throat:     Mouth: Mucous membranes are moist.  Cardiovascular:     Rate and Rhythm: Normal rate.  Pulmonary:     Effort: Pulmonary effort is normal. No respiratory distress.  Abdominal:     Palpations: Abdomen is soft.     Tenderness: There is no abdominal tenderness.  Musculoskeletal:        General: No swelling.  Skin:    General: Skin is warm.  Neurological:     Mental Status: He is alert and oriented to person, place, and time. Mental status is at baseline.  Psychiatric:        Mood and Affect: Mood normal.    ED Results / Procedures / Treatments   Labs (all labs ordered are listed, but only abnormal results are displayed) Labs Reviewed  CBC WITH DIFFERENTIAL/PLATELET - Abnormal; Notable for the following components:       Result Value   RBC 3.74 (*)    Hemoglobin 10.9 (*)    HCT 32.2 (*)    All other components within normal limits  BASIC METABOLIC PANEL - Abnormal; Notable for the following components:   Glucose, Bld 110 (*)    All other components within normal limits  TROPONIN I (HIGH SENSITIVITY) - Abnormal; Notable for the following components:   Troponin I (High Sensitivity) 40 (*)    All other components within normal limits  BRAIN NATRIURETIC PEPTIDE  MAGNESIUM  TSH    EKG EKG Interpretation  Date/Time:  Friday September 03 2021 17:41:54 EST Ventricular Rate:  63 PR Interval:    QRS Duration: 98 QT Interval:  406 QTC Calculation: 415 R Axis:   -67 Text Interpretation: Atrial flutter with variable A-V block Left axis deviation Anterior infarct , age undetermined Abnormal ECG When compared with ECG of 08-Dec-2019 15:01, PREVIOUS ECG IS PRESENT Similar to previous Confirmed by Lavenia Atlas [phone]) on 09/03/2021 6:57:09 PM  Radiology DG Chest Port 1 View  Result Date: 09/03/2021 CLINICAL DATA:  Weakness EXAM: PORTABLE CHEST 1 VIEW COMPARISON:  08/20/2018.  CT 07/13/2021 FINDINGS: Prior median sternotomy. Heart is normal size. No confluent airspace opacities or effusions. No acute bony abnormality. IMPRESSION: No active disease. Electronically Signed   By: Rolm Baptise M.D.   On: 09/03/2021 19:04    Procedures Procedures    Medications Ordered in ED Medications - No data to display  ED Course/ Medical Decision Making/ A&P                           Medical Decision Making Amount and/or Complexity of Data Reviewed Labs: ordered. Radiology: ordered.  Risk Prescription drug management.   This patient presents to the ED for concern of abnormal outpatient blood work, near syncope episode, this involves an extensive number of treatment options, and is a complaint that carries with it a high risk of complications and morbidity.  The differential diagnosis includes ACS, PE, syncope, heart  failure   Additional history obtained: -Additional history obtained from wife at bedside -External records from outside source obtained and reviewed including: Chart review including previous notes, labs, imaging, consultation notes   Lab Tests: -I ordered, reviewed, and interpreted labs.  The pertinent results include: Elevated but flat troponin, otherwise baseline labs   EKG -Unchanged for the patient   Imaging Studies ordered: -I ordered imaging studies including chest x-ray and CT of the chest -I independently visualized and interpreted imaging which showed no acute findings, no pulmonary embolus -I agree with the radiologist interpretation   Consultations Obtained: I requested consultation with the on-call cardiologist for Dr.Ganji. Spoke with Dr. Virgina Jock,  and discussed lab and imaging findings as well as pertinent plan - they recommend: follow up in the office for further cardiac testing   ED Course: 81 year old male presents emergency department with concern for elevated troponin on outpatient blood work with a near syncopal episode.  Vitals are stable here.  EKG is unchanged.  Troponin is elevated but flat.  No active chest pain.  No findings of heart failure.  Blood work is otherwise reassuring.  Given his history of prostate cancer and lung nodule that is currently being evaluated D-dimer was done which was elevated.  Even though patient is anticoagulated concern for possible thromboembolism given the near syncope.  CT PE study shows no blood clot.  Consulted with on-call cardiologist due to the patient's complicated past medical history and seated near syncope with elevated troponin.  We believe these elevated troponins are flat in his baseline.  He has no other acute chest pain or signs of active ACS.  Patient will require more cardiac testing however we are reassured with his current work-up and do not feel he needs emergent admission at this time.  Patient and wife agree  with discharge plan to follow-up in the office for further cardiac testing.   Critical Interventions: Cardiology consult   Cardiac Monitoring: The patient was  maintained on a cardiac monitor.  I personally viewed and interpreted the cardiac monitored which showed an underlying rhythm of: Atrial flutter   Reevaluation: After the interventions noted above, I reevaluated the patient and found that they have :improved   Dispostion: Patient at this time appears safe and stable for discharge and close outpatient follow up. Discharge plan and strict return to ED precautions discussed, patient verbalizes understanding and agreement.        Final Clinical Impression(s) / ED Diagnoses Final diagnoses:  None    Rx / DC Orders ED Discharge Orders     None         Lorelle Gibbs, DO 09/03/21 2304

## 2021-09-03 NOTE — Discharge Instructions (Signed)
You have been seen and discharged from the emergency department.  Your blood work today showed elevated but stable troponins.  This may be normal for you.  Your EKG was unchanged.  The CT of your chest showed no blood clot or other acute abnormality.  The rest your blood work was very reassuring.  I spoke with Dr. Virgina Jock who is on call for Dr. [name].  They are reassured with your work-up as well, recommend follow-up in the office on Monday for further cardiac testing. Take home medications as prescribed. If you have any worsening symptoms or further concerns for your health please return to an emergency department for further evaluation.

## 2021-09-03 NOTE — Progress Notes (Signed)
eferred in December 2021 for groundglass opacity by Cody Bowen, MD    This is a 81 year old gentleman with a past medical history of MI, chronic systolic and diastolic heart failure, prior CABG plus valve in 2019 by Dr. Roxy Manns.  Hypertension, GERD, diabetes.  Patient had a recent CT scan of the chest on 06/11/2020 which revealed a 2.8 x 2.5 groundglass opacity within the left lower lobe concerning for a potential underlying primary lung cancer such as a lipidic adenocarcinoma.  Patient was seen by cardiothoracic surgery, Dr. Kipp Brood and referred to [place] for image follow-up and consideration for bronchoscopy if needed.  Patient denies weight loss, shortness of breath neurologic symptoms or hemoptysis.  He still occasionally smokes a pipe.    09/21/2020: Here today for follow-up regarding the CT scan chest.  Recent CT chest reveals resolution of previously rounded groundglass opacity that was seen in December.  However does show lower lobe areas of septal thickening which may be related to scarring from previous surgeries.  This in conjunction with his prior pulmonary function test.  Patient's PFTs were completed in August 2019 which revealed a reduced DLCO of 39% predicted a normal ratio but also a reduced FEV1 and FVC.  Due to the abnormalities consistent with significant restriction present we will have a HRCT complete for next follow-up of nodule.  We reviewed this today in the office with patient.  From a respiratory standpoint he has no complaints today.  07/21/2021: Here today for follow-up regarding HRCT imaging of the chest.  Patient had CT imagingFor follow-up of basilar parenchymal lung changes concerning for interstitial lung disease.  Patient had mild pattern of pulmonary fibrosis that had slightly worsened in comparison to his February CT categorized as early indeterminant UIP however the progression per reading was concerning for UIP.  Of note he has had progressive shortness of breath  with exertion.   OV 09/03/2021 -transfer of care from Dr. Leory Plowman Icard to Dr. Chase Caller in the ILD center.  Subjective:  Patient ID: Cody Phelps, male , DOB: 03/22/1941 , age 79 y.o. , MRN: [id number] , ADDRESS: [place] [id number] PCP Cody Bowen, MD Patient Care Team: Cody Bowen, MD as PCP - General (Endocrinology)  This Provider for this visit: Treatment Team:  Attending Provider: Brand Males, MD    09/03/2021 -history is given by the wife and also the patient and mostly from the records.  Patient gives the least amount of history. Chief Complaint  Patient presents with   Follow-up    ILD eval per Dr. Valeta Harms.  Pt states that he does have an occasional cough and also has SOB when he exerts himself. Pt's spouse states that pt has had a couple spells to where pt felt like he was going to pass out while sitting at the table.     HPI [name] 81 y.o. -has been following with Dr. Leory Plowman Icard since late 2021 for lung nodule.  It appears by early this year the lung nodule had resolved.  However in the interim he has developed shortness of breath.  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  Therefore he is here.  In talking to the wife he tells me that for the last few months he has had insidious onset of shortness of breath particularly walking to the kitchen from the bathroom or from the bedroom he would get shortness of breath.  Improved by rest.  There is no associated chest pain.  She feels it is  getting worse.  Then subsequently this week on 08/31/2021 Tuesday while sitting in the kitchen table he became ashen white and became dizzy.  But then this resolved shortly thereafter.  The same thing happened the following day on 09/01/2021.  There was dizziness.  But this time he did not turn pale.  They are denying any cough.  No chest pain no paroxysmal nocturnal dyspnea no orthopnea.  He has some basilar edema that is mild and fluctuates and there is no change in that.  He is  known to have sleep apnea for which he is on CPAP.  He uses a cane because of severe back pain.  He is obese.  He is on gabapentin.  He is on PPI treatment.  He is on amiodarone.  He has significant heart disease including atrial flutter.  He is on anticoagulation.  His cardiologist is Dr. [name].  He has upcoming appointment later this month.  Chart review also shows he has chronic anemia with a hemoglobin between 9 and 10 g% for which he is on the iron tablets.  Despite the symptoms his walking desaturation test he was normal.  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  Radiologist also described progression.  However in my personal visualization the severity of ILD is very mild.  Progressive ILD could contribute or be associated with progressive shortness of breath but in my visualization again the ILD is minimal.  Paradoxically his pulmonary function test shows improvement.    Simple office walk 185 feet x  3 laps goal with forehead probe 09/03/2021    O2 used ra   Number laps completed 3 attempt but only did 1.5l laps   Comments about pace Slow pace with ane   Resting Pulse Ox/HR 100% and 63/min   Final Pulse Ox/HR 100% and 72/min   Desaturated </= 88% no   Desaturated <= 3% points no   Got Tachycardic >/= 90/min no   Symptoms at end of test Mild dyspnea   Miscellaneous comments Stopped due to gait issues      CT Chest data HRCT 07/13/21  Narrative & Impression  CLINICAL DATA:  Interstitial lung disease   EXAM: CT CHEST WITHOUT CONTRAST   TECHNIQUE: Multidetector CT imaging of the chest was performed following the standard protocol without intravenous contrast. High resolution imaging of the lungs, as well as inspiratory and expiratory imaging, was performed.   COMPARISON:  09/17/2020, 06/11/2020   FINDINGS: Cardiovascular: Aortic atherosclerosis. Status post  median sternotomy and aortic valve replacement. Cardiomegaly. Extensive 3 vessel coronary artery calcifications and/or stents. Left atrial appendage clip. No pericardial effusion.   Mediastinum/Nodes: No enlarged mediastinal, hilar, or axillary lymph nodes. Small hiatal hernia. Thyroid gland, trachea, and esophagus demonstrate no significant findings.   Lungs/Pleura: There is mild pulmonary fibrosis in a pattern with apical to basal gradient, featuring irregular peripheral interstitial opacity and septal thickening. No clear evidence of subpleural bronchiolectasis. These findings are slightly worsened in comparison to prior examination dated 09/17/2020. Lobular air trapping on expiratory phase imaging. No pleural effusion or pneumothorax.   Upper Abdomen: No acute abnormality.   Musculoskeletal: No chest wall mass or suspicious bone lesions identified.   IMPRESSION: 1. There is mild pulmonary fibrosis in a pattern with apical to basal gradient, featuring irregular peripheral interstitial opacity and septal thickening. No clear evidence of subpleural bronchiolectasis. These findings are slightly worsened in comparison to prior  examination dated 09/17/2020. Strictly characterized, findings are (early) indeterminate for UIP per consensus guidelines, however progression is worrisome for UIP: Diagnosis of Idiopathic Pulmonary Fibrosis: An Official ATS/ERS/JRS/ALAT Clinical Practice Guideline. [place], Iss 5, [phone], Apr 01 2017. 2. Lobular air trapping on expiratory phase imaging, suggestive of small airways disease. 3. Cardiomegaly and coronary artery disease.   Aortic Atherosclerosis (ICD10-I70.0).     Electronically Signed   By: Delanna Ahmadi M.D.   On: 07/14/2021 13:53    No results found.    PFT  PFT Results Latest Ref Rng & Units 08/23/2021 03/19/2018  FVC-Pre L 3.10 1.73  FVC-Predicted Pre % 78 42  FVC-Post L 3.29 1.65  FVC-Predicted  Post % 83 40  Pre FEV1/FVC % % 76 80  Post FEV1/FCV % % 79 87  FEV1-Pre L 2.36 1.38  FEV1-Predicted Pre % 83 47  FEV1-Post L 2.59 1.43  DLCO uncorrected ml/min/mmHg 14.12 12.22  DLCO UNC% % 58 39  DLCO corrected ml/min/mmHg 17.05 15.93  DLCO COR %Predicted % 71 51  DLVA Predicted % 83 102  TLC L 5.84 4.09  TLC % Predicted % 85 59  RV % Predicted % 77 90       has a past medical history of Acute anterior wall MI ([place]) (03/01/2018), Acute combined systolic and diastolic heart failure ([name]) (03/15/2018), Burn, Chronic diastolic CHF (congestive heart failure) ([place]) (09/21/2018), Defect, retina, with detachment, Dementia ([place]), Diverticulosis, ED (erectile dysfunction), First degree heart block, GERD (gastroesophageal reflux disease), Heart murmur, History of blood transfusion, Hypertension, Hypertensive retinopathy, Iron deficiency anemia, Mixed hyperlipidemia, Morbid obesity ([place]), Myocardial infarction ([place]), OA (osteoarthritis), OSA on CPAP, PAF (paroxysmal atrial fibrillation) ([place]), Prostate cancer ([place]), Retinal detachment, RLS (restless legs syndrome), 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   reports that he quit smoking about 54 years ago. His smoking use included cigarettes. He has a 0.25 pack-year smoking history. He has never used smokeless tobacco.  Past Surgical History:  Procedure Laterality Date   AORTIC VALVE REPLACEMENT N/A 05/30/2018   Procedure: AORTIC VALVE REPLACEMENT (AVR) using Inspiris Valve, Size 23;  Surgeon: [name], MD;  Location: [place];  Service: Open Heart Surgery;  Laterality: N/A;   APPENDECTOMY  1988   BILATERAL TOTAL ETHMOIDECTOMY AND SPHENOIDECTOMY  01-14-2009    dr Redmond Baseman   [facility]   CARDIOVERSION N/A 08/10/2018   Procedure: CARDIOVERSION;  Surgeon: Adrian Prows, MD;  Location: [facility];  Service: Cardiovascular;  Laterality: N/A;   CARPAL TUNNEL RELEASE Bilateral 2013   CATARACT EXTRACTION Bilateral    Dr. [name]   CATARACT EXTRACTION W/ INTRAOCULAR LENS  IMPLANT, BILATERAL  date?   CORONARY ARTERY BYPASS GRAFT N/A 05/30/2018   CORONARY BALLOON ANGIOPLASTY N/A 03/01/2018   Procedure: CORONARY BALLOON ANGIOPLASTY;  Surgeon: Adrian Prows, MD;  Location: [place] [facility];  Service: Cardiovascular;  Laterality: N/A;   CORONARY/GRAFT ACUTE MI REVASCULARIZATION N/A 03/01/2018   Procedure: Coronary/Graft Acute MI Revascularization;  Surgeon: Adrian Prows, MD;  Location: [place] [facility];  Service: Cardiovascular;  Laterality: N/A;   CYSTOSCOPY  07/19/2017   Procedure: CYSTOSCOPY;  Surgeon: Nickie Retort, MD;  Location: [facility];  Service: Urology;;  No seeds found in [place] Bilateral    Cat Sx OU.  Dislocated IOL sx  OD.  Rheg. RD repair OS.   EYE SURGERY     GAS/FLUID EXCHANGE Right 06/13/2019   Procedure: GAS/FLUID EXCHANGE;  Surgeon: [name], MD;  Location: [facility];  Service: Ophthalmology;  Laterality: Right;   LAMINECTOMY WITH POSTERIOR LATERAL ARTHRODESIS LEVEL 2 N/A 02/12/2014   Procedure: LUMBAR TWO-THREE,LUIMBAR THREE-FOUR LAMINECTOMY/FORAMINOTOMY;POSSIBLE POSTEROLATERAL ARTHRODESIS WITH AUTOGRAFT;  Surgeon: [name], MD;  Location: [facility];  Service: Neurosurgery;  Laterality: N/A;   LEFT HEART CATH AND CORONARY ANGIOGRAPHY N/A 03/01/2018   Procedure: LEFT HEART CATH AND CORONARY ANGIOGRAPHY;  Surgeon: Adrian Prows, MD;  Location: [place] [facility];  Service: Cardiovascular;  Laterality: N/A;   MAZE N/A 05/30/2018   Procedure: MAZE;  Surgeon: [name], MD;  Location: [place];  Service: Open Heart Surgery;  Laterality: N/A;   ORIF RIGHT ANKLE FX'S  05/05/2001   retained hardware   PARS PLANA VITRECTOMY Right 06/13/2019   Procedure: PARS PLANA VITRECTOMY WITH 25 GAUGE WITH LASER AND GAS;   Surgeon: [name], MD;  Location: [place] [facility];  Service: Ophthalmology;  Laterality: Right;   PARS PLANA VITRECTOMY Right 09/12/2019   Procedure: Pars Plana Vitrectomy With 25 Gauge;  Surgeon: [name], MD;  Location: [facility];  Service: Ophthalmology;  Laterality: Right;   PERFLUORONE INJECTION Right 06/13/2019   Procedure: PERFLUORONE INJECTION;  Surgeon: [name], MD;  Location: [place] [facility];  Service: Ophthalmology;  Laterality: Right;   PHOTOCOAGULATION WITH LASER Right 06/13/2019   Procedure: PHOTOCOAGULATION WITH LASER;  Surgeon: [name], MD;  Location: [facility];  Service: Ophthalmology;  Laterality: Right;   PLACEMENT AND SUTURE OF SECONDARY INTRAOCULAR LENS Right 09/12/2019   Procedure: PLACEMENT AND SUTURE OF SECONDARY INTRAOCULAR LENS;  Surgeon: [name], MD;  Location: [facility];  Service: Ophthalmology;  Laterality: Right;   PROSTATE BIOPSY  03-17-2017   dr Pilar Jarvis office   RADIOACTIVE SEED IMPLANT N/A 07/19/2017   Procedure: RADIOACTIVE SEED IMPLANT/BRACHYTHERAPY IMPLANT;  Surgeon: Nickie Retort, MD;  Location: [facility];  Service: Urology;  Laterality: N/A;  69 seeds implanted   REMOVAL RETAINED LENS Right 09/12/2019   Procedure: REMOVAL RETAINED LENS;  Surgeon: [name], MD;  Location: [facility];  Service: Ophthalmology;  Laterality: Right;   RETINAL DETACHMENT SURGERY Right 06/13/2019   Dr. Coralyn Pear   SKIN GRAFT     face   SPACE OAR INSTILLATION N/A 07/19/2017   Procedure: SPACE OAR INSTILLATION;  Surgeon: Nickie Retort, MD;  [facility];  Service: Urology;  Laterality: N/A;   TEE WITHOUT CARDIOVERSION  03/20/2012   Procedure: TRANSESOPHAGEAL ECHOCARDIOGRAM (TEE);  Surgeon: Laverda Page, MD;  Location: [facility];  Service: Cardiovascular;  Laterality: N/A;  normal LV; normal EF; normal RV; normal LA w/ left atrial appendage very small, normal function, interatrial septum intact without defect; normal RA; trace MR,TR, &  PI; mild AV calcification and senile degeneration w/ mild stenosis, AVA 1.7cm^2;;    TEE WITHOUT CARDIOVERSION N/A 05/30/2018   Procedure: TRANSESOPHAGEAL ECHOCARDIOGRAM (TEE);  Surgeon: [name], MD;  Location: [facility];  Service: Open Heart Surgery;  Laterality: N/A;   TOTAL HIP ARTHROPLASTY Right 10/23/2017   Procedure: TOTAL HIP ARTHROPLASTY ANTERIOR APPROACH;  Surgeon: Frederik Pear, MD;  Location: [place];  Service: Orthopedics;  Laterality: Right;   TRANSTHORACIC ECHOCARDIOGRAM  01-11-2017   dr [name]  (per echo note, no significant change in seveity of AS, no other diagnostic change)   moderate concentric LVH, ef 64%, grade 1  diastolic dysfunction/  moderate LAE/  mild , grade 1 AR w/ moderate AV calcification, mild to moderate restricted AV leaflets w/ moderate AS, AVA 1.16cm^2, peak grandient 16mmHg, mean grandient 31mmHg/  trace MR, mild calcification MV annulus , mild MV leaflet calcification, mild MVS, peak grandient 4.18mmHg, mean grandient 2.15mmHg/ trace TR    No Known Allergies  Immunization History  Administered Date(s) Administered   Fluad Quad(high Dose 65+) 03/26/2019   Influenza, High Dose Seasonal PF 05/08/2015, 05/01/2017, 05/11/2018, 04/20/2020, 04/01/2021   PFIZER(Purple Top)SARS-COV-2 Vaccination 08/21/2019, 09/09/2019, 05/13/2020   Tdap 10/29/2018   Zoster Recombinat (Shingrix) 12/14/2017, 08/03/2018    Family History  Problem Relation Age of Onset   Stroke Mother 71   Hypertension Mother    Hypertension Father    Heart disease Father    Heart attack Father 38   Heart murmur Father    Hypertension Sister    Lung disease Brother    Colon cancer Neg Hx    Stomach cancer Neg Hx    Rectal cancer Neg Hx    Pancreatic cancer Neg Hx      Current Outpatient Medications:    amiodarone (PACERONE) 200 MG tablet, Take 200 mg by mouth daily., Disp: , Rfl:    atorvastatin (LIPITOR) 80 MG tablet, TAKE 1 TABLET BY MOUTH  DAILY, Disp: 90 tablet, Rfl: 3   brimonidine  (ALPHAGAN) 0.2 % ophthalmic solution, Place 1 drop into the right eye every 8 (eight) hours., Disp: 10 mL, Rfl: 3   Bromfenac Sodium (PROLENSA) 0.07 % SOLN, Place 1 drop into the right eye 4 (four) times daily., Disp: 3 mL, Rfl: 3   colesevelam (WELCHOL) 625 MG tablet, Take 625 mg by mouth 3 (three) times daily. , Disp: , Rfl:    donepezil (ARICEPT) 10 MG tablet, Take 10 mg by mouth every morning. , Disp: , Rfl:    dorzolamide-timolol (COSOPT) 22.3-6.8 MG/ML ophthalmic solution, Place 1 drop into the right eye 2 (two) times daily., Disp: 10 mL, Rfl: 3   esomeprazole (NEXIUM) 40 MG capsule, Take 40 mg by mouth daily. , Disp: , Rfl:    ferrous sulfate 325 (65 FE) MG tablet, TAKE 1 TABLET BY MOUTH EVERY DAY WITH BREAKFAST, Disp: 90 tablet, Rfl: 3   gabapentin (NEURONTIN) 300 MG capsule, , Disp: , Rfl:    HYDROcodone bit-homatropine (HYCODAN) 5-1.5 MG/5ML syrup, SMARTSIG:5 Milliliter(s) By Mouth Every 12 Hours PRN, Disp: , Rfl:    insulin glargine (LANTUS) 100 UNIT/ML injection, Inject 20-40 Units into the skin at bedtime as needed (BGL below 140-150 take 20 units, if 151 or higher take 40 units)., Disp: , Rfl:    Loteprednol Etabonate (LOTEMAX SM) 0.38 % GEL, Place 1 drop into the right eye 4 (four) times daily., Disp: 5 g, Rfl: 3   memantine (NAMENDA) 10 MG tablet, Take 1 tablet (10 mg total) by mouth 2 (two) times daily., Disp: 180 tablet, Rfl: 1   metoprolol tartrate (LOPRESSOR) 25 MG tablet, TAKE 1 TABLET BY MOUTH  DAILY (Patient taking differently: Take 12.5 mg by mouth 2 (two) times daily.), Disp: 90 tablet, Rfl: 3   mirabegron ER (MYRBETRIQ) 50 MG TB24 tablet, Take 50 mg by mouth at bedtime. , Disp: , Rfl:    omega-3 acid ethyl esters (LOVAZA) 1 G capsule, Take 2 g by mouth daily., Disp: , Rfl:    rOPINIRole (REQUIP) 2 MG tablet, Take 2 mg by mouth 3 (three) times daily as needed (RLS). , Disp: , Rfl:  spironolactone (ALDACTONE) 25 MG tablet, TAKE ONE-HALF TABLET BY  MOUTH DAILY, Disp: 45  tablet, Rfl: 3   tamsulosin (FLOMAX) 0.4 MG CAPS capsule, Take 0.4 mg by mouth daily after supper. , Disp: , Rfl:    valsartan-hydrochlorothiazide (DIOVAN-HCT) 80-12.5 MG tablet, TAKE 1 TABLET BY MOUTH IN  THE MORNING, Disp: 90 tablet, Rfl: 3   vitamin B-12 (CYANOCOBALAMIN) 1000 MCG tablet, Take 1,000 mcg by mouth 2 (two) times daily., Disp: , Rfl:    XARELTO 15 MG TABS tablet, TAKE 1 TABLET BY MOUTH IN  THE EVENING AFTER DINNER, Disp: 90 tablet, Rfl: 3   nitroGLYCERIN (NITROSTAT) 0.4 MG SL tablet, Place 1 tablet (0.4 mg total) under the tongue every 5 (five) minutes as needed for up to 25 days for chest pain., Disp: 25 tablet, Rfl: 3   torsemide (DEMADEX) 20 MG tablet, Take 1 tablet (20 mg total) by mouth as directed. 2 times a week on Tuesday and Thursday.  Take additional dose as directed with weight gain of 2 Lbs, Disp: 90 tablet, Rfl: 1      Objective:   Vitals:   09/03/21 1351  BP: 132/64  Pulse: 63  SpO2: 98%  Weight: 211 lb 3.2 oz (95.8 kg)  Height: 5\' 8"  (1.727 m)    Estimated body mass index is 32.11 kg/m as calculated from the following:   Height as of this encounter: 5\' 8"  (1.727 m).   Weight as of this encounter: 211 lb 3.2 oz (95.8 kg).  @WEIGHTCHANGE @  [employer]   09/03/21 1351  Weight: 211 lb 3.2 oz (95.8 kg)     Physical Exam  General: No distress.  Slightly deconditioned male.   has a cane Neuro: Alert and Oriented x 3. GCS 15. Speech normal Psych: Pleasant Resp:  Barrel Chest - no.  Wheeze - no, Crackles - no, No overt respiratory distress CVS: Normal heart sounds. Murmurs - no Ext: Stigmata of Connective Tissue Disease - no HEENT: Normal upper airway. PEERL +. No post nasal drip        Assessment:       ICD-10-CM   1. DOE (dyspnea on exertion)  R06.09 ANA    Rheumatoid factor    Cyclic citrul peptide antibody, IgG    CBC with Differential/Platelet    Basic metabolic panel    INR/PT    Hepatic function panel    Troponin I (High  Sensitivity)    Brain natriuretic peptide    Pulmonary function test    Brain natriuretic peptide    Troponin I (High Sensitivity)    Hepatic function panel    INR/PT    Basic metabolic panel    CBC with Differential/Platelet    Cyclic citrul peptide antibody, IgG    Rheumatoid factor    ANA    2. Dizziness  R42     3. Former smoker  Z87.891     4. History of anemia  Z86.2     5. Interstitial pulmonary disease ([place])  J84.9     6. History of sleep apnea  Z86.69          Plan:     Patient Instructions     ICD-10-CM   1. DOE (dyspnea on exertion)  R06.09     2. Dizziness  R42     3. Former smoker  Z87.891     4. History of anemia  Z86.2     5. Interstitial pulmonary disease ([place])  J84.9     6.  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  However, in my opinion the amount of interstitial lung disease seen on CT scan of the chest December 2022 is extremely mild and extremely early.  In addition your breathing test is improved between 2019 and January 2023.  This should not cause shortness of breath at this level.  In addition would not explain the dizziness you are experiencing this week  Suspect other reasons for worsening shortness of breath and dizziness including chronic worsening of anemia and cardiac issues. Doubt pulmonary embolism because he already on anticoagulation  Plan  - Do blood test for interstitial lung disease including ANA, rheumatoid factor, CCP, QuantiFERON gold - Check CBC,, INR chemistry, liver function test, troponin and BNP -And she will follow-up with Dr. [name] later this month -Recommend echocardiogram if Dr. [name] agrees with it [facility] call his office and follow-up] -Continue CPAP for sleep apnea -Go to the ER if you are getting  worse  Follow-up - Do spirometry and DLCO in 6 months -Return to see Dr. Chase Caller in 6 months in a 30-minute slot but after testing  Addendum: After he left the office appointment came back at 24.  I have informed the triage nurse to call him and let him know to go to the emergency department.     SIGNATURE    Dr. Brand Males, M.D., F.C.C.P,  Pulmonary and Critical Care Medicine Staff Physician, [place] Director - Interstitial Lung Disease  Program  Pulmonary [place] [facility] [place], [place], [address]  Pager: [phone], If no answer or between  15:00h - 7:00h: call [phone] Telephone: [phone]  5:26 PM 09/03/2021

## 2021-09-03 NOTE — Telephone Encounter (Signed)
Re Cody Phelps [employer] [place] [id number]   Irma Santos just secure chatted me saying the troponin is 46.    Plan  -Wife should take the patient to the emergency department -they can go to [place] which is close by or [place]    LABS    Latest Reference Range & Units 03/01/18 05:14 03/01/18 07:38 03/01/18 12:44 03/01/18 19:36 03/15/18 17:53 03/15/18 18:09 09/03/21 14:54  Troponin I <0.03 ng/mL 24.93 (HH) 22.43 (HH) 26.12 (HH) 22.81 (HH) 0.66 (HH)    Troponin i, poc 0.00 - 0.08 ng/mL      0.54 (HH)   High Sens Troponin I 2 - 17 ng/L       46 (HH)  (HH): Data is critically high   PULMONARY No results for input(s): PHART, PCO2ART, PO2ART, HCO3, TCO2, O2SAT in the last 168 hours.  Invalid input(s): PCO2, PO2  CBC Recent Labs  Lab 09/03/21 1454  HGB 10.6*  HCT 31.8*  WBC 6.8  PLT 226.0    COAGULATION Recent Labs  Lab 09/03/21 1454  INR 1.9*    CARDIAC  No results for input(s): TROPONINI in the last 168 hours. Recent Labs  Lab 09/03/21 1454  PROBNP 86.0     CHEMISTRY Recent Labs  Lab 09/03/21 1454  NA 138  K 4.2  CL 104  CO2 28  GLUCOSE 102*  BUN 20  CREATININE 1.29  CALCIUM 9.6   Estimated Creatinine Clearance: 53 mL/min (by C-G formula based on SCr of 1.29 mg/dL).   LIVER Recent Labs  Lab 09/03/21 1454  AST 14  ALT 10  ALKPHOS 51  BILITOT 0.4  PROT 7.1  ALBUMIN 4.3  INR 1.9*     INFECTIOUS No results for input(s): LATICACIDVEN, PROCALCITON in the last 168 hours.   ENDOCRINE CBG (last 3)  No results for input(s): GLUCAP in the last 72 hours.       IMAGING x48h  - image(s) personally visualized  -   highlighted in bold No results found.

## 2021-09-03 NOTE — Patient Instructions (Addendum)
ICD-10-CM   1. DOE (dyspnea on exertion)  R06.09     2. Dizziness  R42     3. Former smoker  Z87.891     4. History of anemia  Z86.2     5. 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  However, in my opinion the amount of interstitial lung disease seen on CT scan of the chest December 2022 is extremely mild and extremely early.  In addition your breathing test is improved between 2019 and January 2023.  This should not cause shortness of breath at this level.  In addition would not explain the dizziness you are experiencing this week  Suspect other reasons for worsening shortness of breath and dizziness including chronic worsening of anemia and cardiac issues. Doubt pulmonary embolism because he already on anticoagulation  Plan  - Do blood test for interstitial lung disease including ANA, rheumatoid factor, CCP, QuantiFERON gold - Check CBC,, INR chemistry, liver function test, troponin and BNP -And she will follow-up with Dr. [name] later this month -Recommend echocardiogram if Dr. [name] agrees with it [facility] call his office and follow-up] -Continue CPAP for sleep apnea -Go to the ER if you are getting worse  Follow-up - Do spirometry and DLCO in 6 months -Return to see Dr. Chase Caller in 6 months in a 30-minute slot but after testing

## 2021-09-03 NOTE — ED Notes (Signed)
Patient transported to CT 

## 2021-09-03 NOTE — Telephone Encounter (Signed)
Called and spoke to wife and let her know that troponin level was 46 and that he needs to go to the emergency room to get checked out asap per Dr Chase Caller advice. Wife states she will get him to the emergency room and update up. Nothing further needed

## 2021-09-03 NOTE — ED Triage Notes (Signed)
Last week he had a near syncopal episode. Today he saw his MD for a lung problem and was told to come to the ER due to elevated lab test that pts wife cannot remember what test was done. No pain.

## 2021-09-04 ENCOUNTER — Telehealth: Payer: Self-pay | Admitting: Cardiology

## 2021-09-04 DIAGNOSIS — R55 Syncope and collapse: Secondary | ICD-10-CM | POA: Insufficient documentation

## 2021-09-04 DIAGNOSIS — R778 Other specified abnormalities of plasma proteins: Secondary | ICD-10-CM | POA: Insufficient documentation

## 2021-09-04 NOTE — Telephone Encounter (Signed)
Received a call from [place]. 81 y/o Caucasian male with hypertension, hyperlipidemia, permanent Afib, CAD s/p CABG, s/p TAVR for severe AS, OSA, with recent near syncopal episodes. He was sent to ER after trop checked by pulmonologist returned mildly elevated. Patient has no chest pain, dyspnea, as per ER physician. Trop mildly elevated but flat with no rise and fall. EKG with no acute ischemic changes. Unlikely ACS. Patient hemodynamically stable. He does need workup for near syncopal episodes, but does not warrant hospitalization. Will arrange outpatient follow up, monitor in the coming week. Our office will contact the patient.   Nigel Mormon, MD Pager: [phone] Office: [phone]

## 2021-09-04 NOTE — Telephone Encounter (Signed)
Please set up routine follow up with me for syncope

## 2021-09-06 ENCOUNTER — Encounter: Payer: Self-pay | Admitting: Cardiology

## 2021-09-06 LAB — ANA: Anti Nuclear Antibody (ANA): NEGATIVE

## 2021-09-06 LAB — RHEUMATOID FACTOR: Rheumatoid fact SerPl-aCnc: <14 IU/mL (ref <14)

## 2021-09-06 LAB — CYCLIC CITRUL PEPTIDE ANTIBODY, IGG: Cyclic Citrullin Peptide Ab: <16 UNITS

## 2021-09-06 NOTE — Telephone Encounter (Signed)
I am good with this

## 2021-09-06 NOTE — Telephone Encounter (Signed)
Pt has an appt with [place] 09/27/21. Did you want that moved up?

## 2021-09-07 ENCOUNTER — Ambulatory Visit: Payer: Medicare Other | Admitting: Student

## 2021-09-07 ENCOUNTER — Encounter: Payer: Self-pay | Admitting: Student

## 2021-09-07 ENCOUNTER — Inpatient Hospital Stay: Payer: Medicare Other

## 2021-09-07 ENCOUNTER — Other Ambulatory Visit: Payer: Self-pay

## 2021-09-07 VITALS — BP 124/68 | HR 63 | Temp 97.8°F | Ht 68.0 in | Wt 210.0 lb

## 2021-09-07 DIAGNOSIS — I25118 Atherosclerotic heart disease of native coronary artery with other forms of angina pectoris: Secondary | ICD-10-CM

## 2021-09-07 DIAGNOSIS — I484 Atypical atrial flutter: Secondary | ICD-10-CM

## 2021-09-07 DIAGNOSIS — R778 Other specified abnormalities of plasma proteins: Secondary | ICD-10-CM

## 2021-09-07 DIAGNOSIS — Z953 Presence of xenogenic heart valve: Secondary | ICD-10-CM

## 2021-09-07 DIAGNOSIS — R42 Dizziness and giddiness: Secondary | ICD-10-CM

## 2021-09-07 NOTE — Progress Notes (Signed)
Primary Physician/Referring:  Cody Bowen, MD  Patient ID: Cody Phelps, male    DOB: [birth year], 81 y.o.   MRN: [id number]  Chief Complaint  Patient presents with   Coronary Artery Disease   Follow-up   Hypertension   Dizziness   Hospitalization Follow-up    HPI:    HPI: Cody Phelps  is a 81 y.o. Caucasian male h/o aortic valve replacement along with LIMA to D1 on 05/30/2018, invasive prostate cancer diagnosed in Nov 2018 and is S/P chemo and RT and in remission, anemia of chronic disease and also mild iron defeciency, hypertension, hyperlipidemia, obstructive sleep apnea on CPAP, permanent atrial fibrillation/atypical a flutter, controlled diabetes mellitus, mild obesity, mostly truncal obesity. 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  Patient was last seen in our office 04/28/2021 at which time he was stable from a cardiovascular standpoint.  Patient was evaluated 09/03/2021 at [facility] ED given 2 near syncopal episodes the week prior.  Patient was seen by pulmonology who ordered outpatient troponin which was elevated, he was therefore advised to go to the emergency department.  Work-up in the ED revealed troponins were mildly elevated but flat and his work-up was otherwise unremarkable.  Patient was discharged and recommended to follow-up with our office.  Patient now presents for follow-up for further evaluation and management of the above.   Patient reports he had another similar episode this morning where he began to feel lightheaded and weak.  No syncope, and symptoms resolved with rest after approximately 5 minutes.  Patient admits that he drinks coffee and soda throughout the day, with very little water intake.  He is accompanied by his wife at today's office visit.  He continues to tolerate anticoagulation without bleeding diathesis.  Past  Medical History:  Diagnosis Date   Acute anterior wall MI ([place]) 03/01/2018   Acute combined systolic and diastolic heart failure ([place]) 03/15/2018   Burn    Chronic diastolic CHF (congestive heart failure) ([place]) 09/21/2018   Defect, retina, with detachment    Dementia ([place])    Diverticulosis    ED (erectile dysfunction)    First degree heart block    GERD (gastroesophageal reflux disease)    Heart murmur    History of blood transfusion    with open heart surgery   Hypertension    cardiologsit-  dr Cody Phelps   Hypertensive retinopathy    OU   Iron deficiency anemia    Mixed hyperlipidemia    Morbid obesity ([place])    Myocardial infarction ([place])    02/28/18   OA (osteoarthritis)    right hip   OSA on CPAP    followed by dr dohmeier, uses CPAP   PAF (paroxysmal atrial fibrillation) ([place])    a. on Eliquis   Prostate cancer (Cody Phelps)    dx 03-17-2017 (bx)  Stage T2a, Gleason 4+4, PSA  4.43, vol 32.32--  s/p radiatctive prostate seed implants 07-10-2017 then IMRT and ADT   Retinal detachment    Rheg. 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   LIMA to Diagonal Branch   S/P Maze operation for atrial fibrillation 05/30/2018   Complete bilateral atrial lesion set using bipolar radiofrequency and cryothermy  ablation with clipping of LA appendage   Scoliosis    Trigger finger    Type 2 diabetes mellitus ([place])    Wears partial dentures    upper and lower   Past Surgical History:  Procedure Laterality Date   AORTIC VALVE REPLACEMENT N/A 05/30/2018   Procedure: AORTIC VALVE REPLACEMENT (AVR) using Inspiris Valve, Size 23;  Surgeon: Cody Alberts, MD;  Location: [place];  Service: Open Heart Surgery;  Laterality: N/A;   APPENDECTOMY  1988   BILATERAL TOTAL ETHMOIDECTOMY AND SPHENOIDECTOMY  01-14-2009    dr Redmond Baseman   [facility]   CARDIOVERSION  N/A 08/10/2018   Procedure: CARDIOVERSION;  Surgeon: Cody Prows, MD;  Location: [place];  Service: Cardiovascular;  Laterality: N/A;   CARPAL TUNNEL RELEASE Bilateral 2013   CATARACT EXTRACTION Bilateral    Dr. [name]   CATARACT EXTRACTION W/ INTRAOCULAR LENS  IMPLANT, BILATERAL  date?   CORONARY ARTERY BYPASS GRAFT N/A 05/30/2018   CORONARY BALLOON ANGIOPLASTY N/A 03/01/2018   Procedure: CORONARY BALLOON ANGIOPLASTY;  Surgeon: Cody Prows, MD;  Location: [place] CV [facility];  Service: Cardiovascular;  Laterality: N/A;   CORONARY/GRAFT ACUTE MI REVASCULARIZATION N/A 03/01/2018   Procedure: Coronary/Graft Acute MI Revascularization;  Surgeon: Cody Prows, MD;  Location: [place] [facility];  Service: Cardiovascular;  Laterality: N/A;   CYSTOSCOPY  07/19/2017   Procedure: CYSTOSCOPY;  Surgeon: Cody Retort, MD;  Location: [facility];  Service: Urology;;  No seeds found in [place] Bilateral    Cat Sx OU.  Dislocated IOL sx OD.  Rheg. RD repair OS.   EYE SURGERY     GAS/FLUID EXCHANGE Right 06/13/2019   Procedure: GAS/FLUID EXCHANGE;  Surgeon: Cody Caffey, MD;  Location: [place];  Service: Ophthalmology;  Laterality: Right;   LAMINECTOMY WITH POSTERIOR LATERAL ARTHRODESIS LEVEL 2 N/A 02/12/2014   Procedure: LUMBAR TWO-THREE,LUIMBAR THREE-FOUR LAMINECTOMY/FORAMINOTOMY;POSSIBLE POSTEROLATERAL ARTHRODESIS WITH AUTOGRAFT;  Surgeon: Cody Stakes, MD;  Location: [facility];  Service: Neurosurgery;  Laterality: N/A;   LEFT HEART CATH AND CORONARY ANGIOGRAPHY N/A 03/01/2018   Procedure: LEFT HEART CATH AND CORONARY ANGIOGRAPHY;  Surgeon: Cody Prows, MD;  Location: [place] [facility];  Service: Cardiovascular;  Laterality: N/A;   MAZE N/A 05/30/2018   Procedure: MAZE;  Surgeon: Cody Alberts, MD;  Location: [place];  Service: Open Heart Surgery;  Laterality: N/A;   ORIF RIGHT ANKLE FX'S  05/05/2001   retained hardware   PARS PLANA VITRECTOMY Right 06/13/2019    Procedure: PARS PLANA VITRECTOMY WITH 25 GAUGE WITH LASER AND GAS;  Surgeon: Cody Caffey, MD;  Location: [place];  Service: Ophthalmology;  Laterality: Right;   PARS PLANA VITRECTOMY Right 09/12/2019   Procedure: Pars Plana Vitrectomy With 25 Gauge;  Surgeon: Cody Caffey, MD;  Location: [place];  Service: Ophthalmology;  Laterality: Right;   PERFLUORONE INJECTION Right 06/13/2019   Procedure: PERFLUORONE INJECTION;  Surgeon: Cody Caffey, MD;  Location: [place];  Service: Ophthalmology;  Laterality: Right;   PHOTOCOAGULATION WITH LASER Right 06/13/2019   Procedure: PHOTOCOAGULATION WITH LASER;  Surgeon: Cody Caffey, MD;  Location: [place];  Service: Ophthalmology;  Laterality: Right;   PLACEMENT AND SUTURE OF SECONDARY INTRAOCULAR LENS Right 09/12/2019   Procedure: PLACEMENT AND SUTURE OF SECONDARY INTRAOCULAR LENS;  Surgeon: Cody Caffey, MD;  Location: [place];  Service: Ophthalmology;  Laterality: Right;   PROSTATE BIOPSY  03-17-2017   dr Pilar Jarvis office   RADIOACTIVE SEED IMPLANT N/A 07/19/2017  Procedure: RADIOACTIVE SEED IMPLANT/BRACHYTHERAPY IMPLANT;  Surgeon: Cody Retort, MD;  Location: [facility];  Service: Urology;  Laterality: N/A;  69 seeds implanted   REMOVAL RETAINED LENS Right 09/12/2019   Procedure: REMOVAL RETAINED LENS;  Surgeon: Cody Caffey, MD;  Location: [place];  Service: Ophthalmology;  Laterality: Right;   RETINAL DETACHMENT SURGERY Right 06/13/2019   Dr. Coralyn Pear   SKIN GRAFT     face   SPACE OAR INSTILLATION N/A 07/19/2017   Procedure: SPACE OAR INSTILLATION;  Surgeon: Cody Retort, MD;  Location: [facility];  Service: Urology;  Laterality: N/A;   TEE WITHOUT CARDIOVERSION  03/20/2012   Procedure: TRANSESOPHAGEAL ECHOCARDIOGRAM (TEE);  Surgeon: Laverda Page, MD;  Location: [facility];  Service: Cardiovascular;  Laterality: N/A;  normal LV; normal EF; normal RV; normal LA w/ left atrial appendage very small, normal function,  interatrial septum intact without defect; normal RA; trace MR,TR, & PI; mild AV calcification and senile degeneration w/ mild stenosis, AVA 1.7cm^2;;    TEE WITHOUT CARDIOVERSION N/A 05/30/2018   Procedure: TRANSESOPHAGEAL ECHOCARDIOGRAM (TEE);  Surgeon: Cody Alberts, MD;  Location: [place];  Service: Open Heart Surgery;  Laterality: N/A;   TOTAL HIP ARTHROPLASTY Right 10/23/2017   Procedure: TOTAL HIP ARTHROPLASTY ANTERIOR APPROACH;  Surgeon: Frederik Pear, MD;  Location: [place];  Service: Orthopedics;  Laterality: Right;   TRANSTHORACIC ECHOCARDIOGRAM  01-11-2017   dr Cody Phelps  (per echo note, no significant change in seveity of AS, no other diagnostic change)   moderate concentric LVH, ef 97%, grade 1 diastolic dysfunction/  moderate LAE/  mild , grade 1 AR w/ moderate AV calcification, mild to moderate restricted AV leaflets w/ moderate AS, AVA 1.16cm^2, peak grandient 36mHg, mean grandient 396mg/  trace MR, mild calcification MV annulus , mild MV leaflet calcification, mild MVS, peak grandient 4.74m8m, mean grandient 2.7mm43m trace TR   Family History  Problem Relation Age of Onset   Stroke Mother 80  47ypertension Mother    Hypertension Father    Heart disease Father    Heart attack Father 70  86eart murmur Father    Hypertension Sister    Lung disease Brother    Colon cancer Neg Hx    Stomach cancer Neg Hx    Rectal cancer Neg Hx    Pancreatic cancer Neg Hx    Social History   Tobacco Use   Smoking status: Former    Packs/day: 0.25    Years: 1.00    Pack years: 0.25    Types: Cigarettes    Quit date: 02/04/1967    Years since quitting: 54.6   Smokeless tobacco: Never   Tobacco comments:    smoked 1 q2-3 days  Substance Use Topics   Alcohol use: Not Currently    Comment: occasional beer   Marital Status: Married   ROS  Review of Systems  Cardiovascular:  Positive for dyspnea on exertion (stable) and near-syncope. Negative for chest pain, leg swelling, orthopnea,  paroxysmal nocturnal dyspnea and syncope.  Gastrointestinal:  Negative for melena.  Neurological:  Positive for dizziness.   Objective  Blood pressure 124/68, pulse 63, temperature 97.8 F (36.6 C), temperature source Temporal, height _0  (1.727 m), weight 210 lb (95.3 kg), SpO2 92 %. Body mass index is 31.93 kg/m.   Vitals with BMI 09/07/2021 09/07/2021 09/03/2021  Height - _1  -  Weight - 210 lbs -  BMI - 31.994.80Systolic 124 165 [id number]  Diastolic 68 72 62  Pulse - 63 61  Orthostatic VS for the past 72 hrs (Last 3 readings):  Orthostatic BP Patient Position BP Location Cuff Size Orthostatic Pulse  09/07/21 1440 -- Sitting Left Arm Normal --  09/07/21 1406 139/56 Standing Left Arm Large 61  09/07/21 1404 142/67 Sitting Left Arm Large 61  09/07/21 1403 137/58 Supine Left Arm Large 62   Physical Exam Vitals reviewed.  Constitutional:      General: He is not in acute distress.    Appearance: He is well-developed. He is obese.  Neck:     Thyroid: No thyromegaly.     Vascular: No JVD.  Cardiovascular:     Rate and Rhythm: Normal rate and regular rhythm.     Pulses: Intact distal pulses.          Carotid pulses are 2+ on the right side with bruit and 2+ on the left side with bruit.      Popliteal pulses are 2+ on the right side and 2+ on the left side.       Dorsalis pedis pulses are 2+ on the right side and 1+ on the left side.       Posterior tibial pulses are 0 on the right side and 0 on the left side.     Heart sounds: Murmur heard.  Early systolic murmur is present with a grade of 2/6 at the upper right sternal border radiating to the apex.    No gallop.  Pulmonary:     Effort: Pulmonary effort is normal.     Breath sounds: Normal breath sounds.  Musculoskeletal:     Right lower leg: Edema (trace) present.     Left lower leg: Edema (trace) present.    Laboratory examination:   CMP Latest Ref Rng & Units 09/03/2021 09/03/2021 03/03/2021  Glucose 70 - 99 mg/dL 110(H) 102(H)  106(H)  BUN 8 - 23 mg/dL _0 Creatinine 0.61 - 1.24 mg/dL 1.22 1.29 1.18  Sodium 135 - 145 mmol/L 137 138 141  Potassium 3.5 - 5.1 mmol/L 3.6 4.2 4.5  Chloride 98 - 111 mmol/L 103 104 105  CO2 22 - 32 mmol/L _1 Calcium 8.9 - 10.3 mg/dL 9.3 9.6 9.3  Total Protein 6.0 - 8.3 g/dL - 7.1 -  Total Bilirubin 0.2 - 1.2 mg/dL - 0.4 -  Alkaline Phos 39 - 117 U/L - 51 -  AST 0 - 37 U/L - 14 -  ALT 0 - 53 U/L - 10 -   CBC Latest Ref Rng & Units 09/03/2021 09/03/2021 08/10/2021  WBC 4.0 - 10.5 K/uL 6.9 6.8 7.0  Hemoglobin 13.0 - 17.0 g/dL 10.9(L) 10.6(L) 9.7(L)  Hematocrit 39.0 - 52.0 % 32.2(L) 31.8(L) 28.7(L)  Platelets 150 - 400 K/uL 225 226.0 172    Lipid Panel Recent Labs    09/15/20 1154  CHOL 142  TRIG 101  LDLCALC 70  HDL 53    HEMOGLOBIN A1C Lab Results  Component Value Date   HGBA1C 5.8 (H) 05/28/2018   MPG 119.76 05/28/2018   ProBNP (last 3 results) Recent Labs    09/03/21 1454  PROBNP 86.0    External labs: 09/03/2021: D-dimer 3.39 High-sensitivity troponin 40 --> 39 TSH 0.66 Magnesium 1.9  Cholesterol, total 143.000 m 01/07/2020 HDL 45 MG/DL 01/07/2020 LDL 70.000 mg 01/07/2020 Triglycerides 142.000 01/07/2020  A1C 6.000 % 01/07/2020 TSH 1.000 01/07/2020  Hemoglobin 10.000 g/d 01/07/2020 Platelets 176.000 01/01/2020   Creatinine, Serum 1.400  mg/ 01/07/2020 Potassium 3.700 09/12/2019 Magnesium N/D ALT (SGPT) 18.000 uni 01/07/2020  Labs 01/02/2019: A1c 5.8%.  Serum glucose 98 mg, BUN 16, creatinine 1.0, eGFR 72 mL, potassium 3.6, CMP otherwise normal.   HB 8.3/HCT 25.9, platelets 182, normal indicis.   Total cholesterol 98, triglycerides 57, HDL 42, LDL 45.  TSH normal.  Allergies  No Known Allergies   Medications Prior to Visit:   Outpatient Medications Prior to Visit  Medication Sig Dispense Refill   amiodarone (PACERONE) 200 MG tablet Take 200 mg by mouth daily.     atorvastatin (LIPITOR) 80 MG tablet TAKE 1 TABLET BY MOUTH  DAILY 90 tablet 3    brimonidine (ALPHAGAN) 0.2 % ophthalmic solution Place 1 drop into the right eye every 8 (eight) hours. 10 mL 3   Bromfenac Sodium (PROLENSA) 0.07 % SOLN Place 1 drop into the right eye 4 (four) times daily. 3 mL 3   colesevelam (WELCHOL) 625 MG tablet Take 625 mg by mouth 3 (three) times daily.      donepezil (ARICEPT) 10 MG tablet Take 10 mg by mouth every morning.      dorzolamide-timolol (COSOPT) 22.3-6.8 MG/ML ophthalmic solution Place 1 drop into the right eye 2 (two) times daily. 10 mL 3   esomeprazole (NEXIUM) 40 MG capsule Take 40 mg by mouth daily.      ferrous sulfate 325 (65 FE) MG tablet TAKE 1 TABLET BY MOUTH EVERY DAY WITH BREAKFAST 90 tablet 3   HYDROcodone bit-homatropine (HYCODAN) 5-1.5 MG/5ML syrup SMARTSIG:5 Milliliter(s) By Mouth Every 12 Hours PRN     insulin glargine (LANTUS) 100 UNIT/ML injection Inject 20-40 Units into the skin at bedtime as needed (BGL below 140-150 take 20 units, if 151 or higher take 40 units).     Loteprednol Etabonate (LOTEMAX SM) 0.38 % GEL Place 1 drop into the right eye 4 (four) times daily. 5 g 3   memantine (NAMENDA) 10 MG tablet Take 1 tablet (10 mg total) by mouth 2 (two) times daily. 180 tablet 1   metoprolol tartrate (LOPRESSOR) 25 MG tablet TAKE 1 TABLET BY MOUTH  DAILY (Patient taking differently: Take 12.5 mg by mouth 2 (two) times daily.) 90 tablet 3   mirabegron ER (MYRBETRIQ) 50 MG TB24 tablet Take 50 mg by mouth at bedtime.      omega-3 acid ethyl esters (LOVAZA) 1 G capsule Take 2 g by mouth daily.     rOPINIRole (REQUIP) 2 MG tablet Take 2 mg by mouth 3 (three) times daily as needed (RLS).      spironolactone (ALDACTONE) 25 MG tablet TAKE ONE-HALF TABLET BY  MOUTH DAILY 45 tablet 3   tamsulosin (FLOMAX) 0.4 MG CAPS capsule Take 0.4 mg by mouth daily after supper.      torsemide (DEMADEX) 20 MG tablet Take 1 tablet (20 mg total) by mouth as directed. 2 times a week on Tuesday and Thursday.  Take additional dose as directed with weight  gain of 2 Lbs 90 tablet 1   valsartan-hydrochlorothiazide (DIOVAN-HCT) 80-12.5 MG tablet TAKE 1 TABLET BY MOUTH IN  THE MORNING 90 tablet 3   vitamin B-12 (CYANOCOBALAMIN) 1000 MCG tablet Take 1,000 mcg by mouth 2 (two) times daily.     XARELTO 15 MG TABS tablet TAKE 1 TABLET BY MOUTH IN  THE EVENING AFTER DINNER 90 tablet 3   gabapentin (NEURONTIN) 300 MG capsule      nitroGLYCERIN (NITROSTAT) 0.4 MG SL tablet Place 1 tablet (0.4 mg total) under the  tongue every 5 (five) minutes as needed for up to 25 days for chest pain. 25 tablet 3   No facility-administered medications prior to visit.   Final Medications at End of Visit    Current Meds  Medication Sig   amiodarone (PACERONE) 200 MG tablet Take 200 mg by mouth daily.   atorvastatin (LIPITOR) 80 MG tablet TAKE 1 TABLET BY MOUTH  DAILY   brimonidine (ALPHAGAN) 0.2 % ophthalmic solution Place 1 drop into the right eye every 8 (eight) hours.   Bromfenac Sodium (PROLENSA) 0.07 % SOLN Place 1 drop into the right eye 4 (four) times daily.   colesevelam (WELCHOL) 625 MG tablet Take 625 mg by mouth 3 (three) times daily.    donepezil (ARICEPT) 10 MG tablet Take 10 mg by mouth every morning.    dorzolamide-timolol (COSOPT) 22.3-6.8 MG/ML ophthalmic solution Place 1 drop into the right eye 2 (two) times daily.   esomeprazole (NEXIUM) 40 MG capsule Take 40 mg by mouth daily.    ferrous sulfate 325 (65 FE) MG tablet TAKE 1 TABLET BY MOUTH EVERY DAY WITH BREAKFAST   HYDROcodone bit-homatropine (HYCODAN) 5-1.5 MG/5ML syrup SMARTSIG:5 Milliliter(s) By Mouth Every 12 Hours PRN   insulin glargine (LANTUS) 100 UNIT/ML injection Inject 20-40 Units into the skin at bedtime as needed (BGL below 140-150 take 20 units, if 151 or higher take 40 units).   Loteprednol Etabonate (LOTEMAX SM) 0.38 % GEL Place 1 drop into the right eye 4 (four) times daily.   memantine (NAMENDA) 10 MG tablet Take 1 tablet (10 mg total) by mouth 2 (two) times daily.   metoprolol  tartrate (LOPRESSOR) 25 MG tablet TAKE 1 TABLET BY MOUTH  DAILY (Patient taking differently: Take 12.5 mg by mouth 2 (two) times daily.)   mirabegron ER (MYRBETRIQ) 50 MG TB24 tablet Take 50 mg by mouth at bedtime.    omega-3 acid ethyl esters (LOVAZA) 1 G capsule Take 2 g by mouth daily.   rOPINIRole (REQUIP) 2 MG tablet Take 2 mg by mouth 3 (three) times daily as needed (RLS).    spironolactone (ALDACTONE) 25 MG tablet TAKE ONE-HALF TABLET BY  MOUTH DAILY   tamsulosin (FLOMAX) 0.4 MG CAPS capsule Take 0.4 mg by mouth daily after supper.    torsemide (DEMADEX) 20 MG tablet Take 1 tablet (20 mg total) by mouth as directed. 2 times a week on Tuesday and Thursday.  Take additional dose as directed with weight gain of 2 Lbs   valsartan-hydrochlorothiazide (DIOVAN-HCT) 80-12.5 MG tablet TAKE 1 TABLET BY MOUTH IN  THE MORNING   vitamin B-12 (CYANOCOBALAMIN) 1000 MCG tablet Take 1,000 mcg by mouth 2 (two) times daily.   XARELTO 15 MG TABS tablet TAKE 1 TABLET BY MOUTH IN  THE EVENING AFTER DINNER   Radiology  Chest x-ray 09/03/2021: Prior median sternotomy. Heart is normal size. No confluent airspace opacities or effusions. No acute bony abnormality.  IMPRESSION: No active disease.   CTA PE 09/03/2021: 1. No evidence of significant pulmonary embolus. 2. No evidence of active pulmonary disease. 3. Postoperative changes. 4. Aortic atherosclerosis. 5. Small esophageal hiatal hernia.  CT Chest 06/12/2020: 1. There is a rounded ground-glass opacity of the central left lower lobe measuring 2.8 x 2.5 cm with a solid central nodule measuring 4 mm. This is concerning for adenocarcinoma. Comparison to prior examination dated 03/17/2018 is not possible due to the presence of pleural effusions and atelectasis at that time. Given small size, PET-CT is likely of limited utility  to assess for metabolic activity. Recommend initial follow-up examination at 3-6 months to assess for persistence. If unchanged, and  solid component remains <6 mm, annual CT is recommended until 5 years of stability has been established. If persistent these nodules should be considered highly suspicious if the solid component of the nodule is 6 mm or greater in size and enlarging. This recommendation follows the consensus statement: Guidelines for Management of Incidental Pulmonary Nodules Detected on CT Images  2. Scattered ground-glass opacity of the dependent left lung base, generally nonspecific and likely scarring and/or partial atelectasis. 3. Coronary artery disease. 4. Status post aortic valve replacement. 5. Aortic Atherosclerosis (ICD10-I70.0).  Cardiac Studies:   Carotid artery duplex 06/09/18: Right Carotid: Velocities in the right ICA are consistent with a 1-39%  stenosis.  Left Carotid: Velocities in the left ICA are consistent with a 1-39%  Stenosis.  Coronary angiogram 03/20/2018: Left main mildly calcified but normal, LAD large caliber vessel, mild to moderate diffuse disease followed by occlusion in the mid to distal segment, distally the LAD appears to be diffusely diseased with TIMI I flow.  Aborted PTCA. D-2 is moderate to  large sized with tandem 80 to 90% stenosis. Mild disease in other vessels. Severe aortic stenosis with peak to peak gradient of 69 and mean gradient of 53 mmHg.  Severely calcified mitral annulus and moderately calcified aortic valve.  PCV ECHOCARDIOGRAM COMPLETE 31/51/7616 Normal LV systolic function with EF 60%. Left ventricle cavity is normal in size. Normal global wall motion. Doppler evidence of grade II (pseudonormal) diastolic dysfunction, elevated LAP. Calculated EF 60%.  Ablation of the diastolic function couldn't be an error due to presence of mitral annular calcification. Left atrial cavity is moderately dilated at 4.8 cm. Bioprosthetic aortic valve. No evidence of aortic stenosis. No aortic valve regurgitation. No evidence of mitral stenosis. Mild (Grade I) mitral  regurgitation. Moderate mitral valve leaflet calcification. Structurally normal tricuspid valve. No evidence of tricuspid stenosis. Mild tricuspid regurgitation. No evidence of pulmonary hypertension. Compared to the study done on 07/11/2018, moderate mitral regurgitation not present.     EKG: 09/07/2021: Atrial flutter with 3-1 conduction at a ventricular rate of 61 bpm.  Left axis, left anterior fascicular block.  Poor R wave progression, cannot exclude anteroseptal infarct old.  Nonspecific T wave abnormality.  EKG 03/03/2021: Atrial flutter with 3: 1 AV conduction, ventricular rate 61 bpm.  Left axis deviation, left anterior fascicular block.  Poor R progression, cannot exclude anteroseptal infarct old.  LVH.  Nonspecific T abnormality.  No significant change from 09/04/2020, 02/20/2020, 12/08/2019.  Assessment     ICD-10-CM   1. Coronary artery disease of native artery of native heart with stable angina pectoris ([name])  I25.118 EKG 12-Lead    PCV ECHOCARDIOGRAM COMPLETE    PCV MYOCARDIAL PERFUSION WITH LEXISCAN    2. Elevated troponin  R77.8 PCV MYOCARDIAL PERFUSION WITH LEXISCAN    3. Atypical atrial flutter ([name])  I48.4 LONG TERM MONITOR (3-14 DAYS)    PCV MYOCARDIAL PERFUSION WITH LEXISCAN    4. S/P aortic valve replacement with bioprosthetic valve  Z95.3 PCV ECHOCARDIOGRAM COMPLETE    5. Lightheadedness  R42       CHA2DS2-VASc Score is 5.  Yearly risk of stroke: 7.2% (A, HTN, DM, Vasc Dz).  Score of 1=0.6; 2=2.2; 3=3.2; 4=4.8; 5=7.2; 6=9.8; 7=>9.8) -(CHF; HTN; vasc disease DM,  Male = 1; Age <65 =0; 65-74 = 1,  >75 =2; stroke/embolism= 2).    Recommendations:   Jenny Reichmann  SAVALAS MONJE  is a 81 y.o. Caucasian male h/o aortic valve replacement along with LIMA to D1 on 05/30/2018, invasive prostate cancer diagnosed in Nov 2018 and is S/P chemo and RT and in remission, anemia of chronic disease and also mild iron defeciency, hypertension, hyperlipidemia, obstructive sleep apnea on CPAP, permanent  atrial fibrillation/atypical a flutter, controlled diabetes mellitus, mild obesity, mostly truncal obesity. 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  Patient was last seen in our office 04/28/2021 at which time he was stable from a cardiovascular standpoint.  Patient was evaluated 09/03/2021 at [facility] ED given 2 near syncopal episodes the week prior.  Patient was seen by pulmonology who ordered outpatient troponin which was elevated, he was therefore advised to go to the emergency department.  Work-up in the ED revealed troponins were mildly elevated but flat and his work-up was otherwise unremarkable.  Patient was discharged and recommended to follow-up with our office.  Patient now presents for follow-up for further evaluation and management of the above.  Patient's recurrent episodes of near syncope and lightheadedness recommend further evaluation.  He is not orthostatic in the office today and although blood pressure was initially elevated it is well controlled upon recheck and home monitoring.  Will not make changes to antihypertensive medications at this time.  Given history of atrial flutter and episodes of near syncope will obtain 2-week cardiac monitor.  We will also obtain repeat echocardiogram given recent symptoms.  Patient's troponin was elevated in the ED, therefore shared decision was to proceed with further ischemic evaluation.  We will obtain Lexiscan nuclear stress test as patient is not a treadmill candidate given unsteady gait and episodes of lightheadedness.   Reviewed and discussed results from recent ED evaluation with patient and his wife, questions were addressed accordingly.  I reviewed external records and results including ED provider notes, chest x-ray and CTA results, laboratory results.  Follow-up in 6 weeks, sooner if needed.   Alethia Berthold, PA-C 09/07/2021, 2:54 PM Office: [phone]

## 2021-09-21 NOTE — Progress Notes (Signed)
[facility] Note  09/28/2021     CHIEF COMPLAINT Patient presents for Retina Follow Up    HISTORY OF PRESENT ILLNESS: Cody Phelps is a 81 y.o. male who presents to the clinic today for:  HPI     Retina Follow Up   Patient presents with  Other.  In right eye.  Duration of 6 weeks.  I, the attending physician,  performed the HPI with the patient and updated documentation appropriately.        Comments   6 1/2 week follow up CME OD- Really noticing OD isn't focusing well.  Depending more on the Left.  Can't say it is much difference.      Last edited by [name], MD on 09/29/2021 11:55 PM.    Pt feels like right eye is not as "clear as it should be"  Referring physician: [name], MD [place],  [place] [address]  HISTORICAL INFORMATION:   Selected notes from the [facility] Referred by Dr. [name] for concern of RD OD   CURRENT MEDICATIONS: Current Outpatient Medications (Ophthalmic Drugs)  Medication Sig   brimonidine (ALPHAGAN) 0.2 % ophthalmic solution Place 1 drop into the right eye every 8 (eight) hours.   Bromfenac Sodium (PROLENSA) 0.07 % SOLN Place 1 drop into the right eye 4 (four) times daily.   dorzolamide-timolol (COSOPT) 22.3-6.8 MG/ML ophthalmic solution Place 1 drop into the right eye 2 (two) times daily.   Loteprednol Etabonate (LOTEMAX SM) 0.38 % GEL Place 1 drop into the right eye 4 (four) times daily.   No current facility-administered medications for this visit. (Ophthalmic Drugs)   Current Outpatient Medications (Other)  Medication Sig   amiodarone (PACERONE) 200 MG tablet Take 200 mg by mouth daily.   atorvastatin (LIPITOR) 80 MG tablet TAKE 1 TABLET BY MOUTH  DAILY   colesevelam (WELCHOL) 625 MG tablet Take 625 mg by mouth 3 (three) times daily.    donepezil (ARICEPT) 10 MG tablet Take 10 mg by mouth every morning.    esomeprazole (NEXIUM) 40 MG capsule Take 40 mg by mouth  daily.    ferrous sulfate 325 (65 FE) MG tablet TAKE 1 TABLET BY MOUTH EVERY DAY WITH BREAKFAST   gabapentin (NEURONTIN) 300 MG capsule    HYDROcodone bit-homatropine (HYCODAN) 5-1.5 MG/5ML syrup SMARTSIG:5 Milliliter(s) By Mouth Every 12 Hours PRN   insulin glargine (LANTUS) 100 UNIT/ML injection Inject 20-40 Units into the skin at bedtime as needed (BGL below 140-150 take 20 units, if 151 or higher take 40 units).   memantine (NAMENDA) 10 MG tablet Take 1 tablet (10 mg total) by mouth 2 (two) times daily. Please call and make overdue appt for further refills, 1st attempt   metoprolol tartrate (LOPRESSOR) 25 MG tablet TAKE 1 TABLET BY MOUTH  DAILY (Patient taking differently: Take 12.5 mg by mouth 2 (two) times daily.)   mirabegron ER (MYRBETRIQ) 50 MG TB24 tablet Take 50 mg by mouth at bedtime.    omega-3 acid ethyl esters (LOVAZA) 1 G capsule Take 2 g by mouth daily.   rOPINIRole (REQUIP) 2 MG tablet Take 2 mg by mouth 3 (three) times daily as needed (RLS).    spironolactone (ALDACTONE) 25 MG tablet TAKE ONE-HALF TABLET BY  MOUTH DAILY   tamsulosin (FLOMAX) 0.4 MG CAPS capsule Take 0.4 mg by mouth daily after supper.    valsartan-hydrochlorothiazide (DIOVAN-HCT) 80-12.5 MG tablet TAKE 1 TABLET BY MOUTH IN  THE  MORNING   vitamin B-12 (CYANOCOBALAMIN) 1000 MCG tablet Take 1,000 mcg by mouth 2 (two) times daily.   XARELTO 15 MG TABS tablet TAKE 1 TABLET BY MOUTH IN  THE EVENING AFTER DINNER   nitroGLYCERIN (NITROSTAT) 0.4 MG SL tablet Place 1 tablet (0.4 mg total) under the tongue every 5 (five) minutes as needed for up to 25 days for chest pain.   torsemide (DEMADEX) 20 MG tablet Take 1 tablet (20 mg total) by mouth as directed. 2 times a week on Tuesday and Thursday.  Take additional dose as directed with weight gain of 2 Lbs   No current facility-administered medications for this visit. (Other)   REVIEW OF SYSTEMS: ROS   Positive for: Gastrointestinal, Musculoskeletal, Endocrine,  Cardiovascular, Eyes, Respiratory Negative for: Constitutional, Neurological, Skin, Genitourinary, HENT, Psychiatric, Allergic/Imm, Heme/Lymph Last edited by Leonie Douglas, COA on 09/28/2021  1:51 PM.     ALLERGIES No Known Allergies  PAST MEDICAL HISTORY Past Medical History:  Diagnosis Date   Acute anterior wall MI ([place]) 03/01/2018   Acute combined systolic and diastolic heart failure ([place]) 03/15/2018   Burn    Chronic diastolic CHF (congestive heart failure) ([place]) 09/21/2018   Defect, retina, with detachment    Dementia ([place])    Diverticulosis    ED (erectile dysfunction)    First degree heart block    GERD (gastroesophageal reflux disease)    Heart murmur    History of blood transfusion    with open heart surgery   Hypertension    cardiologsit-  dr [name]   Hypertensive retinopathy    OU   Iron deficiency anemia    Mixed hyperlipidemia    Morbid obesity ([place])    Myocardial infarction ([place])    02/28/18   OA (osteoarthritis)    right hip   OSA on CPAP    followed by dr dohmeier, uses CPAP   PAF (paroxysmal atrial fibrillation) ([place])    a. on Eliquis   Prostate cancer ([place])    dx 03-17-2017 (bx)  Stage T2a, Gleason 4+4, PSA  4.43, vol 32.32--  s/p radiatctive prostate seed implants 07-10-2017 then IMRT and ADT   Retinal detachment    Rheg. 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   LIMA to Diagonal Branch   S/P Maze operation for atrial fibrillation 05/30/2018   Complete bilateral atrial lesion set using bipolar radiofrequency and cryothermy ablation with clipping of LA appendage   Scoliosis    Trigger finger    Type 2 diabetes mellitus ([place])    Wears partial dentures    upper and lower   Past Surgical History:  Procedure Laterality Date   AORTIC VALVE REPLACEMENT N/A 05/30/2018   Procedure: AORTIC VALVE  REPLACEMENT (AVR) using Inspiris Valve, Size 23;  Surgeon: [name], MD;  Location: [place];  Service: Open Heart Surgery;  Laterality: N/A;   APPENDECTOMY  1988   BILATERAL TOTAL ETHMOIDECTOMY AND SPHENOIDECTOMY  01-14-2009    dr Redmond Baseman   [facility]   CARDIOVERSION N/A 08/10/2018   Procedure: CARDIOVERSION;  Surgeon: Adrian Prows, MD;  Location: [place];  Service: Cardiovascular;  Laterality: N/A;   CARPAL TUNNEL RELEASE Bilateral 2013   CATARACT EXTRACTION Bilateral    Dr. [name]   CATARACT EXTRACTION W/ INTRAOCULAR LENS  IMPLANT, BILATERAL  date?  CORONARY ARTERY BYPASS GRAFT N/A 05/30/2018   CORONARY BALLOON ANGIOPLASTY N/A 03/01/2018   Procedure: CORONARY BALLOON ANGIOPLASTY;  Surgeon: Adrian Prows, MD;  Location: [place] [facility];  Service: Cardiovascular;  Laterality: N/A;   CORONARY/GRAFT ACUTE MI REVASCULARIZATION N/A 03/01/2018   Procedure: Coronary/Graft Acute MI Revascularization;  Surgeon: Adrian Prows, MD;  Location: [place];  Service: Cardiovascular;  Laterality: N/A;   CYSTOSCOPY  07/19/2017   Procedure: CYSTOSCOPY;  Surgeon: Nickie Retort, MD;  Location: [facility];  Service: Urology;;  No seeds found in [place] Bilateral    Cat Sx OU.  Dislocated IOL sx OD.  Rheg. RD repair OS.   EYE SURGERY     GAS/FLUID EXCHANGE Right 06/13/2019   Procedure: GAS/FLUID EXCHANGE;  Surgeon: [name], MD;  Location: [place];  Service: Ophthalmology;  Laterality: Right;   LAMINECTOMY WITH POSTERIOR LATERAL ARTHRODESIS LEVEL 2 N/A 02/12/2014   Procedure: LUMBAR TWO-THREE,LUIMBAR THREE-FOUR LAMINECTOMY/FORAMINOTOMY;POSSIBLE POSTEROLATERAL ARTHRODESIS WITH AUTOGRAFT;  Surgeon: [name], MD;  Location: [facility];  Service: Neurosurgery;  Laterality: N/A;   LEFT HEART CATH AND CORONARY ANGIOGRAPHY N/A 03/01/2018   Procedure: LEFT HEART CATH AND CORONARY ANGIOGRAPHY;  Surgeon: Adrian Prows, MD;  Location: [facility] [facility];  Service: Cardiovascular;   Laterality: N/A;   MAZE N/A 05/30/2018   Procedure: MAZE;  Surgeon: [name], MD;  Location: [place] [place];  Service: Open Heart Surgery;  Laterality: N/A;   ORIF RIGHT ANKLE FX'S  05/05/2001   retained hardware   PARS PLANA VITRECTOMY Right 06/13/2019   Procedure: PARS PLANA VITRECTOMY WITH 25 GAUGE WITH LASER AND GAS;  Surgeon: [name], MD;  Location: [place];  Service: Ophthalmology;  Laterality: Right;   PARS PLANA VITRECTOMY Right 09/12/2019   Procedure: Pars Plana Vitrectomy With 25 Gauge;  Surgeon: [name], MD;  Location: [facility];  Service: Ophthalmology;  Laterality: Right;   PERFLUORONE INJECTION Right 06/13/2019   Procedure: PERFLUORONE INJECTION;  Surgeon: [name], MD;  Location: [place];  Service: Ophthalmology;  Laterality: Right;   PHOTOCOAGULATION WITH LASER Right 06/13/2019   Procedure: PHOTOCOAGULATION WITH LASER;  Surgeon: [name], MD;  Location: [place] [facility];  Service: Ophthalmology;  Laterality: Right;   PLACEMENT AND SUTURE OF SECONDARY INTRAOCULAR LENS Right 09/12/2019   Procedure: PLACEMENT AND SUTURE OF SECONDARY INTRAOCULAR LENS;  Surgeon: [name], MD;  Location: [place];  Service: Ophthalmology;  Laterality: Right;   PROSTATE BIOPSY  03-17-2017   dr Pilar Jarvis office   RADIOACTIVE SEED IMPLANT N/A 07/19/2017   Procedure: RADIOACTIVE SEED IMPLANT/BRACHYTHERAPY IMPLANT;  Surgeon: Nickie Retort, MD;  [facility];  Service: Urology;  Laterality: N/A;  69 seeds implanted   REMOVAL RETAINED LENS Right 09/12/2019   Procedure: REMOVAL RETAINED LENS;  Surgeon: [name], MD;  Location: [place];  Service: Ophthalmology;  Laterality: Right;   RETINAL DETACHMENT SURGERY Right 06/13/2019   Dr. Coralyn Pear   SKIN GRAFT     face   SPACE OAR INSTILLATION N/A 07/19/2017   Procedure: SPACE OAR INSTILLATION;  Surgeon: Nickie Retort, MD;  Location[facility];  Service: Urology;  Laterality: N/A;   TEE WITHOUT CARDIOVERSION   03/20/2012   Procedure: TRANSESOPHAGEAL ECHOCARDIOGRAM (TEE);  Surgeon: Laverda Page, MD;  Location: [facility];  Service: Cardiovascular;  Laterality: N/A;  normal LV; normal EF; normal RV; normal LA w/ left atrial appendage very small, normal function, interatrial septum intact without defect;  normal RA; trace MR,TR, & PI; mild AV calcification and senile degeneration w/ mild stenosis, AVA 1.7cm^2;;    TEE WITHOUT CARDIOVERSION N/A 05/30/2018   Procedure: TRANSESOPHAGEAL ECHOCARDIOGRAM (TEE);  Surgeon: [name], MD;  Location: [place];  Service: Open Heart Surgery;  Laterality: N/A;   TOTAL HIP ARTHROPLASTY Right 10/23/2017   Procedure: TOTAL HIP ARTHROPLASTY ANTERIOR APPROACH;  Surgeon: Frederik Pear, MD;  Location: [place];  Service: Orthopedics;  Laterality: Right;   TRANSTHORACIC ECHOCARDIOGRAM  01-11-2017   dr [name]  (per echo note, no significant change in seveity of AS, no other diagnostic change)   moderate concentric LVH, ef 83%, grade 1 diastolic dysfunction/  moderate LAE/  mild , grade 1 AR w/ moderate AV calcification, mild to moderate restricted AV leaflets w/ moderate AS, AVA 1.16cm^2, peak grandient 36mHg, mean grandient 358mg/  trace MR, mild calcification MV annulus , mild MV leaflet calcification, mild MVS, peak grandient 4.6m7m, mean grandient 2.7mm101m trace TR    FAMILY HISTORY Family History  Problem Relation Age of Onset   Stroke Mother 80  79ypertension Mother    Hypertension Father    Heart disease Father    Heart attack Father 70  12eart murmur Father    Hypertension Sister    Lung disease Brother    Colon cancer Neg Hx    Stomach cancer Neg Hx    Rectal cancer Neg Hx    Pancreatic cancer Neg Hx     SOCIAL HISTORY Social History   Tobacco Use   Smoking status: Former    Packs/day: 0.25    Years: 1.00    Pack years: 0.25    Types: Cigarettes    Quit date: 02/04/1967    Years since quitting: 54.6   Smokeless tobacco: Never   Tobacco comments:     smoked 1 q2-3 days  Vaping Use   Vaping Use: Never used  Substance Use Topics   Alcohol use: Not Currently    Comment: occasional beer   Drug use: Never       OPHTHALMIC EXAM:  Base Eye Exam     Visual Acuity (Snellen - Linear)       Right Left   Dist [place] 20/40 +2 20/20 -2   Dist ph [place] 20/30 +1          Tonometry (Tonopen, 2:02 PM)       Right Left   Pressure 10 12         Pupils       Dark Light Shape React APD   Right 4 3 Round Minimal None   Left 2 1 Round Minimal None         Visual Fields (Counting fingers)       Left Right    Full Full         Extraocular Movement       Right Left    Full Full         Neuro/Psych     Oriented x3: Yes   Mood/Affect: Normal         Dilation     Both eyes: 1.0% Mydriacyl, 2.5% Phenylephrine @ 2:02 PM           Slit Lamp and Fundus Exam     Slit Lamp Exam       Right Left   Lids/Lashes mild Meibomian gland dysfunction, mild Telangiectasia Dermatochalasis - upper lid, Meibomian gland dysfunction, Edema, Erythema lower lid, positive concretion left palp conj  Conjunctiva/Sclera White and quiet, STK ST quad -- gone Temporal Pinguecula   Cornea well healed superior cataract wound, 1-2+ fine Punctate epithelial erosions, Arcus, mild tear film debris, fine endo pigment Trace Punctate epithelial erosions, trace endo pigment, Debris in tear film   Anterior Chamber Deep and quiet Deep and quiet   Iris Round and dilated to 80m, scattered Transillumination defects 360, +iridodenesis, +pigment deposition, +atrophy Round and dilated   Lens Sutured Akreos IOL well centered 3 piece Posterior chamber intraocular in good position, trace Posterior capsular opacification   Anterior Vitreous post vitrectomy, trace pigment Vitreous syneresis, Posterior vitreous detachment         Fundus Exam       Right Left   Disc 1-2+Pallor, Sharp rim 2+Pallor, Sharp rim, temporal PPA, mild tilt   C/D Ratio 0.3 0.5    Macula Flat, Blunted foveal reflex, mild RPE mottling and clumping central cystic changes - stably improved, +pigment deposition on retinal surface -- improved, scattered MA, mild ERM Flat, Blunted foveal reflex, Retinal pigment epithelial mottling and clumping, No heme or edema   Vessels Mild Vascular attenuation, mild Tortuousity, mild A/V crossing changes Vascular attenuation, mild Tortuous   Periphery attached, good 360 laser in place; ORIGINALLY: bullous superior retinal detachment from 1000-0130, another more shallow detachment lobe from 130-400, lattice and micro tears ST quadrant; Old retinal tear at 0900 with surrounding laser Attached, peripheral laser scars at 1030, No new RT/RD           IMAGING AND PROCEDURES  Imaging and Procedures for _0 @  OCT, Retina - OU - Both Eyes       Right Eye Quality was good. Central Foveal Thickness: 268. Progression has been stable. Findings include normal foveal contour, no SRF, no IRF (Stable improvement in central cystic changes nasal fovea, trace ERM).   Left Eye Quality was good. Central Foveal Thickness: 266. Progression has been stable. Findings include normal foveal contour, no IRF, no SRF.   Notes *Images captured and stored on drive  Diagnosis / Impression:  OD: stable improvement in central cystic changes nasal fovea--no CME; trace ERM OS: NFP, no IRF/SRF   Clinical management:  See below  Abbreviations: NFP - Normal foveal profile. CME - cystoid macular edema. PED - pigment epithelial detachment. IRF - intraretinal fluid. SRF - subretinal fluid. EZ - ellipsoid zone. ERM - epiretinal membrane. ORA - outer retinal atrophy. ORT - outer retinal tubulation. SRHM - subretinal hyper-reflective material             ASSESSMENT/PLAN:    ICD-10-CM   1. Dislocation of intraocular lens, sequela  T85.22XS     2. Right retinal detachment  H33.21     3. 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 History of repair of  retinal tear by laser photocoagulation  Z98.890     5. Diabetes mellitus type 2 without retinopathy ([place]  E11.9 OCT, Retina - OU - Both Eyes    6. Essential hypertension  I10     7. Hypertensive retinopathy of both eyes  H35.033     8. Pseudophakia of both eyes  Z96.1     9. Cystoid macular edema of right eye  H35.351 OCT, Retina - OU - Both Eyes    10. 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 Dislocated IOL OD  - pt with history of partially dislocated 3-piece IOL -- spontaneously dislocated completely into vitreous cavity on 1.23.21  -  s/p 25g PPV w/ IOL explantation and secondary sutured IOL OD -- Akreos AO60 lens, 17.5D -- 02.11.2021             - IOL in good position  - 2 superior nylon sutures removed at slit lamp 03.12.21 -- last nylon suture removed at slit lamp, 04.30.21  - corneal edema resolved; PEE improved             - s/p STK OD #1 (07.09.21), #2 (01.26.22), #3 (08.30.22) for CME  - BCVA 20/30 - stable  - OCT shows stable improvement in IRF/CME (see #11 below)  - IOP 10  - ATs QID OU              - f/u 3 months --DFE/OCT   2,3. Rhegmatogenous retinal detachment, right eye  - bullous, superior, mac off detachment, onset of foveal involvement 11.11.20 by history  - Bi-lobed superior detachment, superior lobe spanning 1000-0130, nasal lobe 0130-0400  - lattice degeneration with microtears noted at 1030  - s/p PPV/PFC/EL/FAX/14% C3F8 OD, 11.12.20             - intraop: HST at 2 oclock was found; also detachment had progressed to subtotal detachment spanning 9 oclock to 6 oclock (going in clockwise direction); also severe zonular insufficiency with IOL very mobile throughout case -- did not dislocate completely during the case or immediately post op  - doing well             - retina attached and in good position--good laser in place  4. History of retinal defects s/p laser retinopexy OU with Dr. Zigmund Daniel in 2013  - OD laser at 0900  - OS laser at 1030  -  stable  5. Diabetes mellitus, type 2 without retinopathy  - A1c was 6.2 in August 2022  - The incidence, risk factors for progression, natural history and treatment options for diabetic retinopathy  were discussed with patient.    - The need for close monitoring of blood glucose, blood pressure, and serum lipids, avoiding cigarette or any type of tobacco, and the need for long term follow up was also discussed with patient.   6,7. Hypertensive retinopathy OU  - discussed importance of tight BP control  - monitor   8. Pseudophakia OU  - s/p CE/IOL OU (Dr. [name])  - 3 piece IOL OD was completely displaced into vitreous -- now with sutured Akreos IOL in good position OD -- see above  - OS 3-piece IOL in good position  - monitor   9. CME OD  - post op edema-- recurrent  - s/p STK OD #1 07.09.21, #2 (01.26.22), #3 (08.24.22)  - today, stable improvement in cystic changes nasal fovea -- 4 months post STK  - cont Lotemax SM QID OD and Prolensa BID OD  - cont Cosopt qdaily OD  - f/u 3 months DFE, OCT  10. Ocular Hypertension OD  - IOP: 10 - ?steroid response -- improved today  - continue Cospot qdaily    Ophthalmic Meds Ordered this visit:  No orders of the defined types were placed in this encounter.    Return in about 3 months (around 12/26/2021) for f/u CME OD, DFE, OCT.  There are no Patient Instructions on file for this visit.  This document serves as a record of services personally performed by Gardiner Sleeper, MD, PhD. It was created on their behalf by San Jetty. Owens Shark, OA an ophthalmic technician. The creation of this record is the  provider's dictation and/or activities during the visit.    Electronically signed by: San Jetty. Owens Shark, [place] 02.21.2023 12:08 AM  Gardiner Sleeper, M.D., Ph.D. Diseases & Surgery of the Retina and Vitreous [facility]  I have reviewed the above documentation for accuracy and completeness, and I agree with the above. Gardiner Sleeper, M.D., Ph.D. 09/30/21 12:08 AM  Abbreviations: M myopia (nearsighted); A astigmatism; H hyperopia (farsighted); P presbyopia; Mrx spectacle prescription;  CTL contact lenses; OD right eye; OS left eye; OU both eyes  XT exotropia; ET esotropia; PEK punctate epithelial keratitis; PEE punctate epithelial erosions; DES dry eye syndrome; MGD meibomian gland dysfunction; ATs artificial tears; PFAT's preservative free artificial tears; [place] nuclear sclerotic cataract; PSC posterior subcapsular cataract; ERM epi-retinal membrane; PVD posterior vitreous detachment; RD retinal detachment; DM diabetes mellitus; DR diabetic retinopathy; NPDR non-proliferative diabetic retinopathy; PDR proliferative diabetic retinopathy; CSME clinically significant macular edema; DME diabetic macular edema; dbh dot blot hemorrhages; CWS cotton wool spot; POAG primary open angle glaucoma; C/D cup-to-disc ratio; HVF humphrey visual field; GVF goldmann visual field; OCT optical coherence tomography; IOP intraocular pressure; BRVO Branch retinal vein occlusion; CRVO central retinal vein occlusion; CRAO central retinal artery occlusion; BRAO branch retinal artery occlusion; RT retinal tear; SB scleral buckle; PPV pars plana vitrectomy; VH Vitreous hemorrhage; PRP panretinal laser photocoagulation; IVK intravitreal kenalog; VMT vitreomacular traction; MH Macular hole;  NVD neovascularization of the disc; NVE neovascularization elsewhere; AREDS age related eye disease study; ARMD age related macular degeneration; POAG primary open angle glaucoma; EBMD epithelial/anterior basement membrane dystrophy; ACIOL anterior chamber intraocular lens; IOL intraocular lens; PCIOL posterior chamber intraocular lens; Phaco/IOL phacoemulsification with intraocular lens placement; [place] photorefractive keratectomy; LASIK laser assisted in situ keratomileusis; HTN hypertension; DM diabetes mellitus; COPD chronic obstructive pulmonary disease

## 2021-09-22 ENCOUNTER — Telehealth: Payer: Self-pay

## 2021-09-22 NOTE — Telephone Encounter (Signed)
Pt was called to schedule his initial CPAP f/u. Pt was driving at the time of call and unable to schedule at this time. Pt states he will call us to schedule his appt when he gets the chance.   Please schedule pt between 3/21-5/18. We are able to view DL online so can be a virtual f/u if he prefers.

## 2021-09-24 ENCOUNTER — Encounter (INDEPENDENT_AMBULATORY_CARE_PROVIDER_SITE_OTHER): Payer: Medicare Other | Admitting: Ophthalmology

## 2021-09-27 ENCOUNTER — Ambulatory Visit: Payer: Medicare Other | Admitting: Cardiology

## 2021-09-28 ENCOUNTER — Ambulatory Visit (INDEPENDENT_AMBULATORY_CARE_PROVIDER_SITE_OTHER): Payer: Medicare Other | Admitting: Ophthalmology

## 2021-09-28 ENCOUNTER — Encounter (INDEPENDENT_AMBULATORY_CARE_PROVIDER_SITE_OTHER): Payer: Self-pay | Admitting: Ophthalmology

## 2021-09-28 ENCOUNTER — Other Ambulatory Visit: Payer: Self-pay | Admitting: Neurology

## 2021-09-28 ENCOUNTER — Other Ambulatory Visit: Payer: Self-pay

## 2021-09-28 DIAGNOSIS — H35351 Cystoid macular degeneration, right eye: Secondary | ICD-10-CM | POA: Diagnosis not present

## 2021-09-28 DIAGNOSIS — I10 Essential (primary) hypertension: Secondary | ICD-10-CM | POA: Diagnosis not present

## 2021-09-28 DIAGNOSIS — H35411 Lattice degeneration of retina, right eye: Secondary | ICD-10-CM

## 2021-09-28 DIAGNOSIS — T8522XS Displacement of intraocular lens, sequela: Secondary | ICD-10-CM

## 2021-09-28 DIAGNOSIS — Z9889 Other specified postprocedural states: Secondary | ICD-10-CM

## 2021-09-28 DIAGNOSIS — H35033 Hypertensive retinopathy, bilateral: Secondary | ICD-10-CM | POA: Diagnosis not present

## 2021-09-28 DIAGNOSIS — E119 Type 2 diabetes mellitus without complications: Secondary | ICD-10-CM

## 2021-09-28 DIAGNOSIS — H3321 Serous retinal detachment, right eye: Secondary | ICD-10-CM | POA: Diagnosis not present

## 2021-09-28 DIAGNOSIS — Z961 Presence of intraocular lens: Secondary | ICD-10-CM

## 2021-09-28 DIAGNOSIS — H2701 Aphakia, right eye: Secondary | ICD-10-CM

## 2021-09-28 DIAGNOSIS — H40051 Ocular hypertension, right eye: Secondary | ICD-10-CM

## 2021-09-29 ENCOUNTER — Encounter (INDEPENDENT_AMBULATORY_CARE_PROVIDER_SITE_OTHER): Payer: Self-pay | Admitting: Ophthalmology

## 2021-10-05 ENCOUNTER — Other Ambulatory Visit: Payer: Self-pay

## 2021-10-05 ENCOUNTER — Ambulatory Visit: Payer: Medicare Other

## 2021-10-05 DIAGNOSIS — I25118 Atherosclerotic heart disease of native coronary artery with other forms of angina pectoris: Secondary | ICD-10-CM

## 2021-10-05 DIAGNOSIS — I484 Atypical atrial flutter: Secondary | ICD-10-CM

## 2021-10-05 DIAGNOSIS — R778 Other specified abnormalities of plasma proteins: Secondary | ICD-10-CM

## 2021-10-05 DIAGNOSIS — Z953 Presence of xenogenic heart valve: Secondary | ICD-10-CM

## 2021-10-19 ENCOUNTER — Other Ambulatory Visit: Payer: Self-pay | Admitting: Neurology

## 2021-10-19 ENCOUNTER — Ambulatory Visit: Payer: Medicare Other | Admitting: Student

## 2021-10-19 ENCOUNTER — Encounter: Payer: Self-pay | Admitting: Student

## 2021-10-19 ENCOUNTER — Other Ambulatory Visit: Payer: Self-pay

## 2021-10-19 VITALS — BP 135/58 | HR 63 | Temp 97.2°F | Resp 16 | Ht 68.0 in | Wt 212.0 lb

## 2021-10-19 DIAGNOSIS — R0609 Other forms of dyspnea: Secondary | ICD-10-CM

## 2021-10-19 DIAGNOSIS — I25118 Atherosclerotic heart disease of native coronary artery with other forms of angina pectoris: Secondary | ICD-10-CM

## 2021-10-19 DIAGNOSIS — I484 Atypical atrial flutter: Secondary | ICD-10-CM

## 2021-10-19 DIAGNOSIS — R7989 Other specified abnormal findings of blood chemistry: Secondary | ICD-10-CM

## 2021-10-19 DIAGNOSIS — R778 Other specified abnormalities of plasma proteins: Secondary | ICD-10-CM

## 2021-10-19 DIAGNOSIS — R9439 Abnormal result of other cardiovascular function study: Secondary | ICD-10-CM

## 2021-10-19 MED ORDER — MEMANTINE HCL 10 MG PO TABS: 10.0000 mg | ORAL_TABLET | Freq: Two times a day (BID) | ORAL | 0 refills | Status: AC

## 2021-10-19 NOTE — Addendum Note (Signed)
Addended by: Georgiann Cocker on: 10/19/2021 08:25 AM ? ? Modules accepted: Orders ? ?

## 2021-10-19 NOTE — Progress Notes (Signed)
? ?Primary Physician/Referring:  [name], MD ? ?Patient ID: Cody Phelps, male    DOB: 10-Aug-1940, 81 y.o.   MRN: [id number] ? ?Chief Complaint  ?Patient presents with  ? Results  ?  Cardiac tests  ? Follow-up  ?  6 weeks  ? ? ?HPI:   ? ?HPI: Cody Phelps  is a 81 y.o. Caucasian male h/o aortic valve replacement along with LIMA to D1 on 05/30/2018, invasive prostate cancer diagnosed in Nov 2018 and is S/P chemo and RT and in remission, anemia of chronic disease and also mild iron defeciency, hypertension, hyperlipidemia, obstructive sleep apnea on CPAP, permanent atrial fibrillation/atypical a flutter, controlled diabetes mellitus, mild obesity, mostly truncal obesity. 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 ? ?Patient was last seen in our office 04/28/2021 at which time he was stable from a cardiovascular standpoint.  Patient was evaluated 09/03/2021 at [facility] ED given 2 near syncopal episodes the week prior.  Patient was seen by pulmonology who ordered outpatient troponin which was elevated, he was therefore advised to go to the emergency department.  Work-up in the ED revealed troponins were mildly elevated but flat and his work-up was otherwise unremarkable.  Patient was discharged and recommended to follow-up with our office.   ? ?Patient was seen in our office 09/07/2021 at which time recommended cardiac monitor given history of syncope and atrial flutter.  Also ordered echocardiogram and Lexiscan nuclear stress test.  Patient now presents for 6-week follow-up.  Cardiac monitor revealed permanent atrial flutter with a maximum heart rate 96 bpm as well as 7 ventricular pauses 3.0-3.2 seconds during sleep hours which were asymptomatic.  Stress test revealed fixed defect in the apical region as well as small to moderate size reversible ischemia in the inferior region with  inferior wall hypokinesis.  Echocardiogram noted LVEF of 59% with severely dilated LA and mild valvular disease, compared to 03/2021 mild mitral stenosis is new but otherwise unchanged.  Patient has had no episodes of syncope or near syncope since last office visit.  However he does continue to have episodes of lightheadedness which are brief lasting seconds to minutes particularly after bending over and standing back up.  He also reports dyspnea on exertion when he walks any more than approximately 25 yards.  Denies chest pain. ? ?He continues to tolerate anticoagulation without bleeding diathesis.  Patient is accompanied by his wife at today's office visit. ? ?Past Medical History:  ?Diagnosis Date  ? Acute anterior wall MI ([place]) 03/01/2018  ? Acute combined systolic and diastolic heart failure ([place]) 03/15/2018  ? Burn   ? Chronic diastolic CHF (congestive heart failure) ([place]) 09/21/2018  ? Defect, retina, with detachment   ? Dementia ([place])   ? Diverticulosis   ? ED (erectile dysfunction)   ? First degree heart block   ? GERD (gastroesophageal reflux disease)   ? Heart murmur   ? History of blood transfusion   ? with open heart surgery  ? Hypertension   ? cardiologsit-  dr [name]  ? Hypertensive retinopathy   ? OU  ? Iron deficiency anemia   ? Mixed hyperlipidemia   ? Morbid obesity ([place])   ? Myocardial infarction [facility])   ? 02/28/18  ? OA (osteoarthritis)   ? right hip  ? OSA on CPAP   ? followed by dr dohmeier, uses CPAP  ? PAF (  paroxysmal atrial fibrillation) ([place])   ? a. on Eliquis  ? Prostate cancer ([place])   ? dx 03-17-2017 (bx)  Stage T2a, Gleason 4+4, PSA  4.43, vol 32.32--  s/p radiatctive prostate seed implants 07-10-2017 then IMRT and ADT  ? Retinal detachment   ? Rheg. RD OS  ? RLS (restless legs syndrome)   ? S/P aortic valve replacement with bioprosthetic valve 05/30/2018  ? 23 mm Edwards Inspiris Resilia stented bovine pericardial tissue valve  ? S/P CABG x 1 05/30/2018  ? LIMA to Diagonal Branch  ? S/P Maze  operation for atrial fibrillation 05/30/2018  ? Complete bilateral atrial lesion set using bipolar radiofrequency and cryothermy ablation with clipping of LA appendage  ? Scoliosis   ? Trigger finger   ? Type 2 diabetes mellitus ([place])   ? Wears partial dentures   ? upper and lower  ? ?Past Surgical History:  ?Procedure Laterality Date  ? AORTIC VALVE REPLACEMENT N/A 05/30/2018  ? Procedure: AORTIC VALVE REPLACEMENT (AVR) using Inspiris Valve, Size 23;  Surgeon: [name], MD;  Location: [place];  Service: Open Heart Surgery;  Laterality: N/A;  ? APPENDECTOMY  1988  ? BILATERAL TOTAL ETHMOIDECTOMY AND SPHENOIDECTOMY  01-14-2009    dr Redmond Baseman   [facility]  ? CARDIOVERSION N/A 08/10/2018  ? Procedure: CARDIOVERSION;  Surgeon: Adrian Prows, MD;  Location: [place];  Service: Cardiovascular;  Laterality: N/A;  ? CARPAL TUNNEL RELEASE Bilateral 2013  ? CATARACT EXTRACTION Bilateral   ? Dr. [name]  ? CATARACT EXTRACTION W/ INTRAOCULAR LENS  IMPLANT, BILATERAL  date?  ? CORONARY ARTERY BYPASS GRAFT N/A 05/30/2018  ? CORONARY BALLOON ANGIOPLASTY N/A 03/01/2018  ? Procedure: CORONARY BALLOON ANGIOPLASTY;  Surgeon: Adrian Prows, MD;  Location: [place] CV LAB;  Service: Cardiovascular;  Laterality: N/A;  ? CORONARY/GRAFT ACUTE MI REVASCULARIZATION N/A 03/01/2018  ? Procedure: Coronary/Graft Acute MI Revascularization;  Surgeon: Adrian Prows, MD;  Location: [place] CV LAB;  Service: Cardiovascular;  Laterality: N/A;  ? CYSTOSCOPY  07/19/2017  ? Procedure: CYSTOSCOPY;  Surgeon: Nickie Retort, MD;  Location: [facility];  Service: Urology;;  No seeds found in blader  ? EYE SURGERY Bilateral   ? Cat Sx OU.  Dislocated IOL sx OD.  Rheg. RD repair OS.  ? EYE SURGERY    ? GAS/FLUID EXCHANGE Right 06/13/2019  ? Procedure: GAS/FLUID EXCHANGE;  Surgeon: [name], MD;  Location: [place];  Service: Ophthalmology;  Laterality: Right;  ? LAMINECTOMY WITH POSTERIOR LATERAL ARTHRODESIS LEVEL 2 N/A 02/12/2014  ? Procedure:  LUMBAR TWO-THREE,LUIMBAR THREE-FOUR LAMINECTOMY/FORAMINOTOMY;POSSIBLE POSTEROLATERAL ARTHRODESIS WITH AUTOGRAFT;  Surgeon: [name], MD;  Location: [facility];  Service: Neurosurgery;  Laterality: N/A;  ? LEFT HEART CATH AND CORONARY ANGIOGRAPHY N/A 03/01/2018  ? Procedure: LEFT HEART CATH AND CORONARY ANGIOGRAPHY;  Surgeon: Adrian Prows, MD;  Location: [place] CV LAB;  Service: Cardiovascular;  Laterality: N/A;  ? MAZE N/A 05/30/2018  ? Procedure: MAZE;  Surgeon: [name], MD;  Location: [place];  Service: Open Heart Surgery;  Laterality: N/A;  ? ORIF RIGHT ANKLE FX'S  05/05/2001  ? retained hardware  ? PARS PLANA VITRECTOMY Right 06/13/2019  ? Procedure: PARS PLANA VITRECTOMY WITH 25 GAUGE WITH LASER AND GAS;  Surgeon: [name], MD;  Location: [place];  Service: Ophthalmology;  Laterality: Right;  ? PARS PLANA VITRECTOMY Right 09/12/2019  ? Procedure: Pars Plana Vitrectomy With 25 Gauge;  Surgeon: [name], MD;  Location: [place];  Service: Ophthalmology;  Laterality: Right;  ? PERFLUORONE INJECTION Right 06/13/2019  ? Procedure: PERFLUORONE INJECTION;  Surgeon: [name], MD;  Location: [place];  Service: Ophthalmology;  Laterality: Right;  ? PHOTOCOAGULATION WITH LASER Right 06/13/2019  ? Procedure: PHOTOCOAGULATION WITH LASER;  Surgeon: [name], MD;  Location: [place];  Service: Ophthalmology;  Laterality: Right;  ? PLACEMENT AND SUTURE OF SECONDARY INTRAOCULAR LENS Right 09/12/2019  ? Procedure: PLACEMENT AND SUTURE OF SECONDARY INTRAOCULAR LENS;  Surgeon: [name], MD;  Location: [place];  Service: Ophthalmology;  Laterality: Right;  ? PROSTATE BIOPSY  03-17-2017   dr Pilar Jarvis office  ? RADIOACTIVE SEED IMPLANT N/A 07/19/2017  ? Procedure: RADIOACTIVE SEED IMPLANT/BRACHYTHERAPY IMPLANT;  Surgeon: Nickie Retort, MD;  Location: [facility];  Service: Urology;  Laterality: N/A;  69 seeds implanted  ? REMOVAL RETAINED LENS Right 09/12/2019  ? Procedure: REMOVAL  RETAINED LENS;  Surgeon: [name], MD;  Location: [place];  Service: Ophthalmology;  Laterality: Right;  ? RETINAL DETACHMENT SURGERY Right 06/13/2019  ? Dr. Coralyn Pear  ? SKIN GRAFT    ? face  ? SPACE OAR INST

## 2021-10-19 NOTE — H&P (View-Only) (Signed)
? ?Primary Physician/Referring:  [name], MD ? ?Patient ID: KARLA VINES, male    DOB: October 03, 1940, 81 y.o.   MRN: [id number] ? ?Chief Complaint  ?Patient presents with  ? Results  ?  Cardiac tests  ? Follow-up  ?  6 weeks  ? ? ?HPI:   ? ?HPI: OBE AHLERS  is a 81 y.o. Caucasian male h/o aortic valve replacement along with LIMA to D1 on 05/30/2018, invasive prostate cancer diagnosed in Nov 2018 and is S/P chemo and RT and in remission, anemia of chronic disease and also mild iron defeciency, hypertension, hyperlipidemia, obstructive sleep apnea on CPAP, permanent atrial fibrillation/atypical a flutter, controlled diabetes mellitus, mild obesity, mostly truncal obesity. 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 ? ?Patient was last seen in our office 04/28/2021 at which time he was stable from a cardiovascular standpoint.  Patient was evaluated 09/03/2021 at [facility] ED given 2 near syncopal episodes the week prior.  Patient was seen by pulmonology who ordered outpatient troponin which was elevated, he was therefore advised to go to the emergency department.  Work-up in the ED revealed troponins were mildly elevated but flat and his work-up was otherwise unremarkable.  Patient was discharged and recommended to follow-up with our office.   ? ?Patient was seen in our office 09/07/2021 at which time recommended cardiac monitor given history of syncope and atrial flutter.  Also ordered echocardiogram and Lexiscan nuclear stress test.  Patient now presents for 6-week follow-up.  Cardiac monitor revealed permanent atrial flutter with a maximum heart rate 96 bpm as well as 7 ventricular pauses 3.0-3.2 seconds during sleep hours which were asymptomatic.  Stress test revealed fixed defect in the apical region as well as small to moderate size reversible ischemia in the inferior region with  inferior wall hypokinesis.  Echocardiogram noted LVEF of 59% with severely dilated LA and mild valvular disease, compared to 03/2021 mild mitral stenosis is new but otherwise unchanged.  Patient has had no episodes of syncope or near syncope since last office visit.  However he does continue to have episodes of lightheadedness which are brief lasting seconds to minutes particularly after bending over and standing back up.  He also reports dyspnea on exertion when he walks any more than approximately 25 yards.  Denies chest pain. ? ?He continues to tolerate anticoagulation without bleeding diathesis.  Patient is accompanied by his wife at today's office visit. ? ?Past Medical History:  ?Diagnosis Date  ? Acute anterior wall MI ([place]) 03/01/2018  ? Acute combined systolic and diastolic heart failure ([place]) 03/15/2018  ? Burn   ? Chronic diastolic CHF (congestive heart failure) ([place]) 09/21/2018  ? Defect, retina, with detachment   ? Dementia ([place])   ? Diverticulosis   ? ED (erectile dysfunction)   ? First degree heart block   ? GERD (gastroesophageal reflux disease)   ? Heart murmur   ? History of blood transfusion   ? with open heart surgery  ? Hypertension   ? cardiologsit-  dr [name]  ? Hypertensive retinopathy   ? OU  ? Iron deficiency anemia   ? Mixed hyperlipidemia   ? Morbid obesity ([place])   ? Myocardial infarction [facility])   ? 02/28/18  ? OA (osteoarthritis)   ? right hip  ? OSA on CPAP   ? followed by dr dohmeier, uses CPAP  ? PAF (  paroxysmal atrial fibrillation) ([place])   ? a. on Eliquis  ? Prostate cancer ([place])   ? dx 03-17-2017 (bx)  Stage T2a, Gleason 4+4, PSA  4.43, vol 32.32--  s/p radiatctive prostate seed implants 07-10-2017 then IMRT and ADT  ? Retinal detachment   ? Rheg. RD OS  ? RLS (restless legs syndrome)   ? S/P aortic valve replacement with bioprosthetic valve 05/30/2018  ? 23 mm Edwards Inspiris Resilia stented bovine pericardial tissue valve  ? S/P CABG x 1 05/30/2018  ? LIMA to Diagonal Branch  ? S/P Maze  operation for atrial fibrillation 05/30/2018  ? Complete bilateral atrial lesion set using bipolar radiofrequency and cryothermy ablation with clipping of LA appendage  ? Scoliosis   ? Trigger finger   ? Type 2 diabetes mellitus ([place])   ? Wears partial dentures   ? upper and lower  ? ?Past Surgical History:  ?Procedure Laterality Date  ? AORTIC VALVE REPLACEMENT N/A 05/30/2018  ? Procedure: AORTIC VALVE REPLACEMENT (AVR) using Inspiris Valve, Size 23;  Surgeon: [name], MD;  Location: [place];  Service: Open Heart Surgery;  Laterality: N/A;  ? APPENDECTOMY  1988  ? BILATERAL TOTAL ETHMOIDECTOMY AND SPHENOIDECTOMY  01-14-2009    dr Redmond Baseman   [facility]  ? CARDIOVERSION N/A 08/10/2018  ? Procedure: CARDIOVERSION;  Surgeon: Adrian Prows, MD;  Location: [place];  Service: Cardiovascular;  Laterality: N/A;  ? CARPAL TUNNEL RELEASE Bilateral 2013  ? CATARACT EXTRACTION Bilateral   ? Dr. [name]  ? CATARACT EXTRACTION W/ INTRAOCULAR LENS  IMPLANT, BILATERAL  date?  ? CORONARY ARTERY BYPASS GRAFT N/A 05/30/2018  ? CORONARY BALLOON ANGIOPLASTY N/A 03/01/2018  ? Procedure: CORONARY BALLOON ANGIOPLASTY;  Surgeon: Adrian Prows, MD;  Location: [place] CV LAB;  Service: Cardiovascular;  Laterality: N/A;  ? CORONARY/GRAFT ACUTE MI REVASCULARIZATION N/A 03/01/2018  ? Procedure: Coronary/Graft Acute MI Revascularization;  Surgeon: Adrian Prows, MD;  Location: [place] CV LAB;  Service: Cardiovascular;  Laterality: N/A;  ? CYSTOSCOPY  07/19/2017  ? Procedure: CYSTOSCOPY;  Surgeon: Nickie Retort, MD;  Location: [facility];  Service: Urology;;  No seeds found in blader  ? EYE SURGERY Bilateral   ? Cat Sx OU.  Dislocated IOL sx OD.  Rheg. RD repair OS.  ? EYE SURGERY    ? GAS/FLUID EXCHANGE Right 06/13/2019  ? Procedure: GAS/FLUID EXCHANGE;  Surgeon: [name], MD;  Location: [place];  Service: Ophthalmology;  Laterality: Right;  ? LAMINECTOMY WITH POSTERIOR LATERAL ARTHRODESIS LEVEL 2 N/A 02/12/2014  ? Procedure:  LUMBAR TWO-THREE,LUIMBAR THREE-FOUR LAMINECTOMY/FORAMINOTOMY;POSSIBLE POSTEROLATERAL ARTHRODESIS WITH AUTOGRAFT;  Surgeon: [name], MD;  Location: [facility];  Service: Neurosurgery;  Laterality: N/A;  ? LEFT HEART CATH AND CORONARY ANGIOGRAPHY N/A 03/01/2018  ? Procedure: LEFT HEART CATH AND CORONARY ANGIOGRAPHY;  Surgeon: Adrian Prows, MD;  Location: [place] CV LAB;  Service: Cardiovascular;  Laterality: N/A;  ? MAZE N/A 05/30/2018  ? Procedure: MAZE;  Surgeon: [name], MD;  Location: [place];  Service: Open Heart Surgery;  Laterality: N/A;  ? ORIF RIGHT ANKLE FX'S  05/05/2001  ? retained hardware  ? PARS PLANA VITRECTOMY Right 06/13/2019  ? Procedure: PARS PLANA VITRECTOMY WITH 25 GAUGE WITH LASER AND GAS;  Surgeon: [name], MD;  Location: [place];  Service: Ophthalmology;  Laterality: Right;  ? PARS PLANA VITRECTOMY Right 09/12/2019  ? Procedure: Pars Plana Vitrectomy With 25 Gauge;  Surgeon: [name], MD;  Location: [place];  Service: Ophthalmology;  Laterality: Right;  ? PERFLUORONE INJECTION Right 06/13/2019  ? Procedure: PERFLUORONE INJECTION;  Surgeon: [name], MD;  Location: [place];  Service: Ophthalmology;  Laterality: Right;  ? PHOTOCOAGULATION WITH LASER Right 06/13/2019  ? Procedure: PHOTOCOAGULATION WITH LASER;  Surgeon: [name], MD;  Location: [place];  Service: Ophthalmology;  Laterality: Right;  ? PLACEMENT AND SUTURE OF SECONDARY INTRAOCULAR LENS Right 09/12/2019  ? Procedure: PLACEMENT AND SUTURE OF SECONDARY INTRAOCULAR LENS;  Surgeon: [name], MD;  Location: [place];  Service: Ophthalmology;  Laterality: Right;  ? PROSTATE BIOPSY  03-17-2017   dr Pilar Jarvis office  ? RADIOACTIVE SEED IMPLANT N/A 07/19/2017  ? Procedure: RADIOACTIVE SEED IMPLANT/BRACHYTHERAPY IMPLANT;  Surgeon: Nickie Retort, MD;  Location: [facility];  Service: Urology;  Laterality: N/A;  69 seeds implanted  ? REMOVAL RETAINED LENS Right 09/12/2019  ? Procedure: REMOVAL  RETAINED LENS;  Surgeon: [name], MD;  Location: [place];  Service: Ophthalmology;  Laterality: Right;  ? RETINAL DETACHMENT SURGERY Right 06/13/2019  ? Dr. Coralyn Pear  ? SKIN GRAFT    ? face  ? SPACE OAR INST

## 2021-10-21 LAB — CBC
Hematocrit: 32.9 % — ABNORMAL LOW (ref 37.5–51.0)
Hemoglobin: 11 g/dL — ABNORMAL LOW (ref 13.0–17.7)
MCH: 28.9 pg (ref 26.6–33.0)
MCHC: 33.4 g/dL (ref 31.5–35.7)
MCV: 87 fL (ref 79–97)
Platelets: 183 10*3/uL (ref 150–450)
RBC: 3.8 x10E6/uL — ABNORMAL LOW (ref 4.14–5.80)
RDW: 13.6 % (ref 11.6–15.4)
WBC: 6.3 10*3/uL (ref 3.4–10.8)

## 2021-10-21 LAB — BASIC METABOLIC PANEL
BUN/Creatinine Ratio: 17 (ref 10–24)
BUN: 18 mg/dL (ref 8–27)
CO2: 22 mmol/L (ref 20–29)
Calcium: 9.3 mg/dL (ref 8.6–10.2)
Chloride: 101 mmol/L (ref 96–106)
Creatinine, Ser: 1.03 mg/dL (ref 0.76–1.27)
Glucose: 110 mg/dL — ABNORMAL HIGH (ref 70–99)
Potassium: 3.7 mmol/L (ref 3.5–5.2)
Sodium: 138 mmol/L (ref 134–144)
eGFR: 74 mL/min/{1.73_m2} (ref >59)

## 2021-10-23 ENCOUNTER — Other Ambulatory Visit (INDEPENDENT_AMBULATORY_CARE_PROVIDER_SITE_OTHER): Payer: Self-pay | Admitting: Ophthalmology

## 2021-10-23 DIAGNOSIS — R0609 Other forms of dyspnea: Secondary | ICD-10-CM | POA: Diagnosis present

## 2021-10-26 ENCOUNTER — Encounter (HOSPITAL_COMMUNITY): Payer: Self-pay | Admitting: Cardiology

## 2021-10-26 ENCOUNTER — Encounter (HOSPITAL_COMMUNITY)
Admission: RE | Disposition: A | Discharge status: Home / Self Care | Payer: Self-pay | Source: Home / Self Care | Attending: Cardiology

## 2021-10-26 ENCOUNTER — Ambulatory Visit (HOSPITAL_COMMUNITY)
Admission: RE | Admit: 2021-10-26 | Discharge: 2021-10-26 | Disposition: A | Discharge status: Home / Self Care | Payer: Medicare Other | Attending: Cardiology | Admitting: Cardiology

## 2021-10-26 ENCOUNTER — Other Ambulatory Visit: Payer: Self-pay

## 2021-10-26 DIAGNOSIS — Z6832 Body mass index (BMI) 32.0-32.9, adult: Secondary | ICD-10-CM | POA: Insufficient documentation

## 2021-10-26 DIAGNOSIS — Z87891 Personal history of nicotine dependence: Secondary | ICD-10-CM | POA: Insufficient documentation

## 2021-10-26 DIAGNOSIS — E785 Hyperlipidemia, unspecified: Secondary | ICD-10-CM | POA: Insufficient documentation

## 2021-10-26 DIAGNOSIS — R0609 Other forms of dyspnea: Secondary | ICD-10-CM | POA: Insufficient documentation

## 2021-10-26 DIAGNOSIS — E119 Type 2 diabetes mellitus without complications: Secondary | ICD-10-CM | POA: Insufficient documentation

## 2021-10-26 DIAGNOSIS — G4733 Obstructive sleep apnea (adult) (pediatric): Secondary | ICD-10-CM | POA: Diagnosis not present

## 2021-10-26 DIAGNOSIS — I484 Atypical atrial flutter: Secondary | ICD-10-CM | POA: Insufficient documentation

## 2021-10-26 DIAGNOSIS — Z79899 Other long term (current) drug therapy: Secondary | ICD-10-CM | POA: Insufficient documentation

## 2021-10-26 DIAGNOSIS — Z794 Long term (current) use of insulin: Secondary | ICD-10-CM | POA: Diagnosis not present

## 2021-10-26 DIAGNOSIS — I10 Essential (primary) hypertension: Secondary | ICD-10-CM | POA: Diagnosis not present

## 2021-10-26 DIAGNOSIS — I4821 Permanent atrial fibrillation: Secondary | ICD-10-CM | POA: Diagnosis not present

## 2021-10-26 DIAGNOSIS — E669 Obesity, unspecified: Secondary | ICD-10-CM | POA: Diagnosis not present

## 2021-10-26 DIAGNOSIS — I25118 Atherosclerotic heart disease of native coronary artery with other forms of angina pectoris: Secondary | ICD-10-CM | POA: Insufficient documentation

## 2021-10-26 DIAGNOSIS — I2729 Other secondary pulmonary hypertension: Secondary | ICD-10-CM

## 2021-10-26 DIAGNOSIS — Z7901 Long term (current) use of anticoagulants: Secondary | ICD-10-CM | POA: Insufficient documentation

## 2021-10-26 DIAGNOSIS — R778 Other specified abnormalities of plasma proteins: Secondary | ICD-10-CM | POA: Insufficient documentation

## 2021-10-26 DIAGNOSIS — I2510 Atherosclerotic heart disease of native coronary artery without angina pectoris: Secondary | ICD-10-CM | POA: Diagnosis present

## 2021-10-26 HISTORY — PX: RIGHT/LEFT HEART CATH AND CORONARY/GRAFT ANGIOGRAPHY: CATH118267

## 2021-10-26 LAB — POCT I-STAT EG7
Acid-base deficit: 1 mmol/L (ref 0.0–2.0)
Acid-base deficit: 2 mmol/L (ref 0.0–2.0)
Bicarbonate: 23.8 mmol/L (ref 20.0–28.0)
Bicarbonate: 23.7 mmol/L (ref 20.0–28.0)
Calcium, Ion: 1.26 mmol/L (ref 1.15–1.40)
Calcium, Ion: 1.26 mmol/L (ref 1.15–1.40)
HCT: 29 % — ABNORMAL LOW (ref 39.0–52.0)
HCT: 29 % — ABNORMAL LOW (ref 39.0–52.0)
Hemoglobin: 9.9 g/dL — ABNORMAL LOW (ref 13.0–17.0)
Hemoglobin: 9.9 g/dL — ABNORMAL LOW (ref 13.0–17.0)
O2 Saturation: 68 %
O2 Saturation: 73 %
Potassium: 3.6 mmol/L (ref 3.5–5.1)
Potassium: 3.6 mmol/L (ref 3.5–5.1)
Sodium: 141 mmol/L (ref 135–145)
Sodium: 141 mmol/L (ref 135–145)
TCO2: 25 mmol/L (ref 22–32)
TCO2: 25 mmol/L (ref 22–32)
pCO2, Ven: 41.3 mmHg — ABNORMAL LOW (ref 44–60)
pCO2, Ven: 41.9 mmHg — ABNORMAL LOW (ref 44–60)
pH, Ven: 7.37 (ref 7.25–7.43)
pH, Ven: 7.36 (ref 7.25–7.43)
pO2, Ven: 36 mmHg (ref 32–45)
pO2, Ven: 40 mmHg (ref 32–45)

## 2021-10-26 LAB — POCT I-STAT 7, (LYTES, BLD GAS, ICA,H+H)
Acid-base deficit: 2 mmol/L (ref 0.0–2.0)
Bicarbonate: 22.6 mmol/L (ref 20.0–28.0)
Calcium, Ion: 1.25 mmol/L (ref 1.15–1.40)
HCT: 27 % — ABNORMAL LOW (ref 39.0–52.0)
Hemoglobin: 9.2 g/dL — ABNORMAL LOW (ref 13.0–17.0)
O2 Saturation: 97 %
Potassium: 3.5 mmol/L (ref 3.5–5.1)
Sodium: 140 mmol/L (ref 135–145)
TCO2: 24 mmol/L (ref 22–32)
pCO2 arterial: 35.8 mmHg (ref 32–48)
pH, Arterial: 7.409 (ref 7.35–7.45)
pO2, Arterial: 92 mmHg (ref 83–108)

## 2021-10-26 LAB — GLUCOSE, CAPILLARY
Glucose-Capillary: 107 mg/dL — ABNORMAL HIGH (ref 70–99)
Glucose-Capillary: 98 mg/dL (ref 70–99)

## 2021-10-26 SURGERY — RIGHT/LEFT HEART CATH AND CORONARY/GRAFT ANGIOGRAPHY
Anesthesia: LOCAL

## 2021-10-26 MED ORDER — ASPIRIN 81 MG PO CHEW
81.0000 mg | CHEWABLE_TABLET | ORAL | Status: AC
  Administered 2021-10-26: 81 mg via ORAL

## 2021-10-26 MED ORDER — FENTANYL CITRATE (PF) 100 MCG/2ML IJ SOLN
INTRAMUSCULAR | Status: AC
Start: 2021-10-26 — End: ?
  Filled 2021-10-26: qty 2

## 2021-10-26 MED ORDER — SODIUM CHLORIDE 0.9 % IV SOLN: INTRAVENOUS | Status: DC

## 2021-10-26 MED ORDER — VERAPAMIL HCL 2.5 MG/ML IV SOLN
INTRAVENOUS | Status: DC | PRN
  Administered 2021-10-26: 5 mL via INTRA_ARTERIAL

## 2021-10-26 MED ORDER — SODIUM CHLORIDE 0.9 % IV SOLN: 250.0000 mL | INTRAVENOUS | Status: DC | PRN

## 2021-10-26 MED ORDER — LIDOCAINE HCL (PF) 1 % IJ SOLN
INTRAMUSCULAR | Status: AC
  Filled 2021-10-26: qty 30

## 2021-10-26 MED ORDER — SODIUM CHLORIDE 0.9 % WEIGHT BASED INFUSION: 1.0000 mL/kg/h | INTRAVENOUS | Status: DC

## 2021-10-26 MED ORDER — SODIUM CHLORIDE 0.9% FLUSH: 3.0000 mL | Freq: Two times a day (BID) | INTRAVENOUS | Status: DC

## 2021-10-26 MED ORDER — SODIUM CHLORIDE 0.9% FLUSH: 3.0000 mL | INTRAVENOUS | Status: DC | PRN

## 2021-10-26 MED ORDER — FENTANYL CITRATE (PF) 100 MCG/2ML IJ SOLN
INTRAMUSCULAR | Status: DC | PRN
  Administered 2021-10-26: 25 ug via INTRAVENOUS

## 2021-10-26 MED ORDER — LIDOCAINE HCL (PF) 1 % IJ SOLN
INTRAMUSCULAR | Status: DC | PRN
  Administered 2021-10-26 (×2): 2 mL

## 2021-10-26 MED ORDER — ONDANSETRON HCL 4 MG/2ML IJ SOLN: 4.0000 mg | Freq: Four times a day (QID) | INTRAMUSCULAR | Status: DC | PRN

## 2021-10-26 MED ORDER — HEPARIN SODIUM (PORCINE) 1000 UNIT/ML IJ SOLN
INTRAMUSCULAR | Status: DC | PRN
  Administered 2021-10-26: 5000 [IU] via INTRAVENOUS

## 2021-10-26 MED ORDER — IOHEXOL 350 MG/ML SOLN
INTRAVENOUS | Status: DC | PRN
  Administered 2021-10-26: 90 mL

## 2021-10-26 MED ORDER — SODIUM CHLORIDE 0.9 % WEIGHT BASED INFUSION
3.0000 mL/kg/h | INTRAVENOUS | Status: AC
  Administered 2021-10-26: 3 mL/kg/h via INTRAVENOUS

## 2021-10-26 MED ORDER — VERAPAMIL HCL 2.5 MG/ML IV SOLN
INTRAVENOUS | Status: AC
  Filled 2021-10-26: qty 2

## 2021-10-26 MED ORDER — HEPARIN (PORCINE) IN NACL 1000-0.9 UT/500ML-% IV SOLN
INTRAVENOUS | Status: DC | PRN
  Administered 2021-10-26 (×2): 500 mL

## 2021-10-26 MED ORDER — HEPARIN SODIUM (PORCINE) 1000 UNIT/ML IJ SOLN
INTRAMUSCULAR | Status: AC
  Filled 2021-10-26: qty 10

## 2021-10-26 MED ORDER — HYDRALAZINE HCL 20 MG/ML IJ SOLN
10.0000 mg | INTRAMUSCULAR | Status: DC | PRN
  Administered 2021-10-26: 10 mg via INTRAVENOUS
  Filled 2021-10-26: qty 1

## 2021-10-26 MED ORDER — HEPARIN (PORCINE) IN NACL 1000-0.9 UT/500ML-% IV SOLN
INTRAVENOUS | Status: AC
  Filled 2021-10-26: qty 1000

## 2021-10-26 MED ORDER — MIDAZOLAM HCL 2 MG/2ML IJ SOLN
INTRAMUSCULAR | Status: DC | PRN
  Administered 2021-10-26: 2 mg via INTRAVENOUS

## 2021-10-26 MED ORDER — MIDAZOLAM HCL 2 MG/2ML IJ SOLN
INTRAMUSCULAR | Status: AC
  Filled 2021-10-26: qty 2

## 2021-10-26 MED ORDER — ASPIRIN 81 MG PO CHEW: 81.0000 mg | CHEWABLE_TABLET | ORAL | Status: DC

## 2021-10-26 MED ORDER — ACETAMINOPHEN 325 MG PO TABS: 650.0000 mg | ORAL_TABLET | ORAL | Status: DC | PRN

## 2021-10-26 MED ORDER — ASPIRIN 81 MG PO CHEW
CHEWABLE_TABLET | ORAL | Status: AC
  Filled 2021-10-26: qty 1

## 2021-10-26 SURGICAL SUPPLY — 15 items
BAND CMPR LRG ZPHR (HEMOSTASIS) ×1
BAND ZEPHYR COMPRESS 30 LONG (HEMOSTASIS) ×1 IMPLANT
CATH BALLN WEDGE 5F 110CM (CATHETERS) ×1 IMPLANT
CATH DIAG 6FR JR4 (CATHETERS) ×1 IMPLANT
CATH INFINITI 6F FL3.5 (CATHETERS) ×1 IMPLANT
CATH OPTITORQUE TIG 4.0 5F (CATHETERS) ×1 IMPLANT
GLIDESHEATH SLEND A-KIT 6F 22G (SHEATH) ×1 IMPLANT
GUIDEWIRE .025 260CM (WIRE) ×1 IMPLANT
GUIDEWIRE INQWIRE 1.5J.035X260 (WIRE) IMPLANT
INQWIRE 1.5J .035X260CM (WIRE) ×2
KIT HEART LEFT (KITS) ×2 IMPLANT
PACK CARDIAC CATHETERIZATION (CUSTOM PROCEDURE TRAY) ×2 IMPLANT
SHEATH GLIDE SLENDER 4/5FR (SHEATH) ×1 IMPLANT
TRANSDUCER W/STOPCOCK (MISCELLANEOUS) ×2 IMPLANT
TUBING CIL FLEX 10 FLL-RA (TUBING) ×2 IMPLANT

## 2021-10-26 NOTE — Interval H&P Note (Signed)
History and Physical Interval Note: ? ?10/26/2021 ?9:49 AM ? ?Cody Phelps  has presented today for surgery, with the diagnosis of cad - doe.  The various methods of treatment have been discussed with the patient and family. After consideration of risks, benefits and other options for treatment, the patient has consented to  Procedure(s): ?RIGHT/LEFT HEART CATH AND CORONARY/GRAFT ANGIOGRAPHY (N/A) and possible angioplasty as a surgical intervention.  The patient's history has been reviewed, patient examined, no change in status, stable for surgery.  I have reviewed the patient's chart and labs.  Questions were answered to the patient's satisfaction.   ?Cath Lab Visit (complete for each Cath Lab visit) ? ?Clinical Evaluation Leading to the Procedure:  ? ?ACS: No. ? ?Non-ACS:   ? ?Anginal Classification: CCS IV ? ?Anti-ischemic medical therapy: Maximal Therapy (2 or more classes of medications) ? ?Non-Invasive Test Results: Intermediate-risk stress test findings: cardiac mortality 1-3%/year ? ?Prior CABG: Previous CABG ? ?Adrian Prows ? ? ?

## 2021-10-29 ENCOUNTER — Other Ambulatory Visit (INDEPENDENT_AMBULATORY_CARE_PROVIDER_SITE_OTHER): Payer: Self-pay

## 2021-10-29 MED ORDER — BRIMONIDINE TARTRATE 0.2 % OP SOLN: 1.0000 [drp] | Freq: Three times a day (TID) | OPHTHALMIC | 3 refills | Status: DC

## 2021-11-01 ENCOUNTER — Ambulatory Visit: Payer: Medicare Other | Admitting: Student

## 2021-11-01 ENCOUNTER — Telehealth: Payer: Self-pay

## 2021-11-01 DIAGNOSIS — Z9889 Other specified postprocedural states: Secondary | ICD-10-CM

## 2021-11-01 NOTE — Progress Notes (Signed)
Patient underwent left and right heart catheterization 10/26/2021.  Patient's wife called the office this morning with concerns that patient had developed a "knot" at the left radial artery access site.  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 Patient also has surround ecchymosis.  ? ?Patient was seen in collaboration with Dr. [name] who applied a compression wrap and advised him to wear this for 4-5 days. Patient is on Xarelto, which Dr. [name] advised him to hold for 2 days.  ? ?Patient will be seen in our office in 1 week for follow up.  ? ?  ICD-10-CM   ?1. Status post cardiac catheterization  Z98.890   ?  ? ? ? ? ?Alethia Berthold, PA-C ?11/01/2021, 2:03 PM ?Office: [phone] ? ?

## 2021-11-01 NOTE — Telephone Encounter (Signed)
Pts wife called regarding a knot that is on his left wrist. His wrist is a bit tender, no bleeding. Pt is coming into the office today 11/01/2021 at 1:15 pm. ?

## 2021-11-04 ENCOUNTER — Ambulatory Visit: Payer: Medicare Other | Admitting: Cardiology

## 2021-11-04 NOTE — Progress Notes (Deleted)
? ?Primary Physician/Referring:  [name], MD ? ?Patient ID: Cody Phelps, male    DOB: October 07, 1940, 81 y.o.   MRN: [id number] ? ?No chief complaint on file. ? ? ?HPI:   ? ?HPI: Cody Phelps  is a 81 y.o. Caucasian male h/o aortic valve replacement along with LIMA to D1 on 05/30/2018, invasive prostate cancer diagnosed in Nov 2018 and is S/P chemo and RT and in remission, anemia of chronic disease and also mild iron defeciency, hypertension, hyperlipidemia, obstructive sleep apnea on CPAP, permanent atrial fibrillation/atypical a flutter, controlled diabetes mellitus, mild obesity, mostly truncal obesity. 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 ? ?Patient has had no episodes of syncope or near syncope since last office visit on 10/19/2021.  However he does continue to have episodes of lightheadedness which are brief lasting seconds to minutes particularly after bending over and standing back up.  He also reports dyspnea on exertion when he walks any more than approximately 25 yards.  Denies chest pain. ? ?Patient underwent left and right heart catheterization 10/26/2021 suggested group 2 pulmonary hypertension.  Patient's wife called the office on 11/01/2021 with concerns that patient had developed a "knot" at the left radial artery access site.  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 Patient also has surround ecchymosis.  ? ?Patient was seen in collaboration and was applied a compression wrap and advised him to wear this for 4-5 days. Patient is on Xarelto, and I advised him to hold for 2 days.  ? ? ?Past Medical History:  ?Diagnosis Date  ? Acute anterior wall MI ([place]) 03/01/2018  ? Acute combined systolic and diastolic heart failure  ([place]) 03/15/2018  ? Burn   ? Chronic diastolic CHF (congestive heart failure) ([place]) 09/21/2018  ? Defect, retina, with detachment   ? Dementia ([place])   ? Diverticulosis   ? ED (erectile dysfunction)   ? First degree heart block   ? GERD (gastroesophageal reflux disease)   ? Heart murmur   ? History of blood transfusion   ? with open heart surgery  ? Hypertension   ? cardiologsit-  dr [name]  ? Hypertensive retinopathy   ? OU  ? Iron deficiency anemia   ? Mixed hyperlipidemia   ? Morbid obesity ([place])   ? Myocardial infarction [facility])   ? 02/28/18  ? OA (osteoarthritis)   ? right hip  ? OSA on CPAP   ? followed by dr dohmeier, uses CPAP  ? PAF (paroxysmal atrial fibrillation) ([place])   ? a. on Eliquis  ? Prostate cancer ([place])   ? dx 03-17-2017 (bx)  Stage T2a, Gleason 4+4, PSA  4.43, vol 32.32--  s/p radiatctive prostate seed implants 07-10-2017 then IMRT and ADT  ? Retinal detachment   ? Rheg. RD OS  ? RLS (restless legs syndrome)   ? S/P aortic valve replacement with bioprosthetic valve 05/30/2018  ? 23 mm Edwards Inspiris Resilia stented bovine pericardial tissue valve  ? S/P CABG x 1 05/30/2018  ? LIMA to Diagonal Branch  ? S/P Maze operation for atrial fibrillation 05/30/2018  ? Complete bilateral atrial lesion set using bipolar radiofrequency and cryothermy ablation with clipping of LA appendage  ? Scoliosis   ? Trigger finger   ? Type 2  diabetes mellitus ([place])   ? Wears partial dentures   ? upper and lower  ? ?Past Surgical History:  ?Procedure Laterality Date  ? AORTIC VALVE REPLACEMENT N/A 05/30/2018  ? Procedure: AORTIC VALVE REPLACEMENT (AVR) using Inspiris Valve, Size 23;  Surgeon: [name], MD;  Location: [place];  Service: Open Heart Surgery;  Laterality: N/A;  ? APPENDECTOMY  1988  ? BILATERAL TOTAL ETHMOIDECTOMY AND SPHENOIDECTOMY  01-14-2009    dr Redmond Baseman   [facility]  ? CARDIOVERSION N/A 08/10/2018  ? Procedure: CARDIOVERSION;  Surgeon: Adrian Prows, MD;  Location: [place];  Service: Cardiovascular;   Laterality: N/A;  ? CARPAL TUNNEL RELEASE Bilateral 2013  ? CATARACT EXTRACTION Bilateral   ? Dr. [name]  ? CATARACT EXTRACTION W/ INTRAOCULAR LENS  IMPLANT, BILATERAL  date?  ? CORONARY ARTERY BYPASS GRAFT N/A 05/30/2018  ? CORONARY BALLOON ANGIOPLASTY N/A 03/01/2018  ? Procedure: CORONARY BALLOON ANGIOPLASTY;  Surgeon: Adrian Prows, MD;  Location: [place] CV [facility];  Service: Cardiovascular;  Laterality: N/A;  ? CORONARY/GRAFT ACUTE MI REVASCULARIZATION N/A 03/01/2018  ? Procedure: Coronary/Graft Acute MI Revascularization;  Surgeon: Adrian Prows, MD;  Location: [place] [facility];  Service: Cardiovascular;  Laterality: N/A;  ? CYSTOSCOPY  07/19/2017  ? Procedure: CYSTOSCOPY;  Surgeon: Nickie Retort, MD;  Location: [facility];  Service: Urology;;  No seeds found in blader  ? EYE SURGERY Bilateral   ? Cat Sx OU.  Dislocated IOL sx OD.  Rheg. RD repair OS.  ? EYE SURGERY    ? GAS/FLUID EXCHANGE Right 06/13/2019  ? Procedure: GAS/FLUID EXCHANGE;  Surgeon: [name], MD;  Location: [place];  Service: Ophthalmology;  Laterality: Right;  ? LAMINECTOMY WITH POSTERIOR LATERAL ARTHRODESIS LEVEL 2 N/A 02/12/2014  ? Procedure: LUMBAR TWO-THREE,LUIMBAR THREE-FOUR LAMINECTOMY/FORAMINOTOMY;POSSIBLE POSTEROLATERAL ARTHRODESIS WITH AUTOGRAFT;  Surgeon: [name], MD;  Location: [facility];  Service: Neurosurgery;  Laterality: N/A;  ? LEFT HEART CATH AND CORONARY ANGIOGRAPHY N/A 03/01/2018  ? Procedure: LEFT HEART CATH AND CORONARY ANGIOGRAPHY;  Surgeon: Adrian Prows, MD;  Location: [place] [facility];  Service: Cardiovascular;  Laterality: N/A;  ? MAZE N/A 05/30/2018  ? Procedure: MAZE;  Surgeon: [name], MD;  Location: [place];  Service: Open Heart Surgery;  Laterality: N/A;  ? ORIF RIGHT ANKLE FX'S  05/05/2001  ? retained hardware  ? PARS PLANA VITRECTOMY Right 06/13/2019  ? Procedure: PARS PLANA VITRECTOMY WITH 25 GAUGE WITH LASER AND GAS;  Surgeon: [name], MD;  Location: [place];  Service:  Ophthalmology;  Laterality: Right;  ? PARS PLANA VITRECTOMY Right 09/12/2019  ? Procedure: [place] Plana Vitrectomy With 25 Gauge;  Surgeon: [name], MD;  Location: [place];  Service: Ophthalmology;  Laterality: Right;  ? PERFLUORONE INJECTION Right 06/13/2019  ? Procedure: PERFLUORONE INJECTION;  Surgeon: [name], MD;  Location: [place];  Service: Ophthalmology;  Laterality: Right;  ? PHOTOCOAGULATION WITH LASER Right 06/13/2019  ? Procedure: PHOTOCOAGULATION WITH LASER;  Surgeon: [name], MD;  Location: [place];  Service: Ophthalmology;  Laterality: Right;  ? PLACEMENT AND SUTURE OF SECONDARY INTRAOCULAR LENS Right 09/12/2019  ? Procedure: PLACEMENT AND SUTURE OF SECONDARY INTRAOCULAR LENS;  Surgeon: [name], MD;  Location: [place];  Service: Ophthalmology;  Laterality: Right;  ? PROSTATE BIOPSY  03-17-2017   dr Pilar Jarvis office  ? RADIOACTIVE SEED IMPLANT N/A 07/19/2017  ? Procedure: RADIOACTIVE SEED IMPLANT/BRACHYTHERAPY IMPLANT;  Surgeon: Nickie Retort, MD;  Location: [facility]  Service:  Urology;  Laterality: N/A;  69 seeds implanted  ? REMOVAL RETAINED LENS Right 09/12/2019  ? Procedure: REMOVAL RETAINED LENS;  Surgeon: [name], MD;  Location: [place];  Service: Ophthalmology;  Laterality: Right;  ? RETINAL DETACHMENT SURGERY Right 06/13/2019  ? Dr. Coralyn Pear  ? RIGHT/LEFT HEART CATH AND CORONARY/GRAFT ANGIOGRAPHY N/A 10/26/2021  ? Procedure: RIGHT/LEFT HEART CATH AND CORONARY/GRAFT ANGIOGRAPHY;  Surgeon: Adrian Prows, MD;  Location: [place] [facility];  Service: Cardiovascular;  Laterality: N/A;  ? SKIN GRAFT    ? face  ? SPACE OAR INSTILLATION N/A 07/19/2017  ? Procedure: SPACE OAR INSTILLATION;  Surgeon: Nickie Retort, MD;  Location: [facility];  Service: Urology;  Laterality: N/A;  ? TEE WITHOUT CARDIOVERSION  03/20/2012  ? Procedure: TRANSESOPHAGEAL ECHOCARDIOGRAM (TEE);  Surgeon: Laverda Page, MD;  Location: [facility]OSCOPY;  Service: Cardiovascular;   Laterality: N/A;  normal LV; normal EF; normal RV; normal LA w/ left atrial appendage very small, normal function, interatrial septum intact without defect; normal RA; trace MR,TR, & PI; mild AV calcification and

## 2021-11-08 ENCOUNTER — Ambulatory Visit: Payer: Medicare Other | Admitting: Cardiology

## 2021-11-15 ENCOUNTER — Encounter: Payer: Self-pay | Admitting: Cardiology

## 2021-11-15 ENCOUNTER — Ambulatory Visit: Payer: Medicare Other | Admitting: Cardiology

## 2021-11-15 VITALS — BP 126/58 | HR 62 | Temp 98.0°F | Resp 17 | Ht 68.0 in | Wt 213.0 lb

## 2021-11-15 DIAGNOSIS — R55 Syncope and collapse: Secondary | ICD-10-CM

## 2021-11-15 DIAGNOSIS — I495 Sick sinus syndrome: Secondary | ICD-10-CM

## 2021-11-15 DIAGNOSIS — I484 Atypical atrial flutter: Secondary | ICD-10-CM

## 2021-11-15 DIAGNOSIS — G4733 Obstructive sleep apnea (adult) (pediatric): Secondary | ICD-10-CM

## 2021-11-15 NOTE — Progress Notes (Signed)
? ?Primary Physician/Referring:  [name], MD ? ?Patient ID: Cody Phelps, male    DOB: 26-Dec-1940, 81 y.o.   MRN: [id number] ? ?Chief Complaint  ?Patient presents with  ? Follow-up  ? AFTER CATH  ? ? ?HPI:   ? ?HPI: Cody Phelps  is a 81 y.o. Caucasian male h/o aortic valve replacement along with LIMA to D1 on 05/30/2018, invasive prostate cancer diagnosed in Nov 2018 and is S/P chemo and RT and in remission, anemia of chronic disease and also mild iron defeciency, hypertension, hyperlipidemia, obstructive sleep apnea on CPAP, permanent atrial fibrillation/atypical a flutter, controlled diabetes mellitus, mild obesity, mostly truncal obesity. 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 ? ?Caucasian male h/o aortic valve replacement along with LIMA to D1 on 05/30/2018, invasive prostate cancer diagnosed in Nov 2018 and is S/P chemo and RT and in remission, anemia of chronic disease and also mild iron defeciency, hypertension, hyperlipidemia, obstructive sleep apnea on CPAP, permanent atrial fibrillation/atypical a flutter, controlled diabetes mellitus, mild obesity, mostly truncal obesity. 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 ? ?Patient was evaluated 09/03/2021 at [facility] ED given 2 near syncopal episodes the week prior.  Outpatient cardiac monitoring had revealed PVCs and PACs but no high degree AV block but also NSVT.  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 ? ?He continues to tolerate anticoagulation without bleeding diathesis.  Patient is accompanied by his wife at today's office visit. ? ?Social History  ? ?Tobacco Use  ? Smoking status: Former  ?  Packs/day: 0.25  ?  Years: 1.00  ?  Pack years: 0.25  ?  Types: Cigarettes  ?  Quit date: 02/04/1967  ?  Years since quitting: 54.8  ? Smokeless tobacco: Never  ? Tobacco comments:  ?  smoked 1 q2-3 days  ?Substance Use Topics  ? Alcohol use: Not Currently  ?  Comment: occasional beer  ? Marital Status: Married  ? ?ROS  ?Review of Systems  ?Cardiovascular:  Positive for dyspnea on exertion (worsening). Negative for chest pain, leg swelling, near-syncope, orthopnea, paroxysmal nocturnal dyspnea and syncope.  ?Gastrointestinal:  Negative for melena.  ?Neurological:  Positive for light-headedness.  ? ?Objective  ?Blood pressure (!) 126/58, pulse 62, temperature 98 ?F (36.7 ?C), temperature source Temporal, resp. rate 17, height 5' 8"  (1.727 m), weight 213 lb (96.6 kg), SpO2 95 %. Body mass index is 32.39 kg/m?. ?  ? ?  11/15/2021  ?  2:34 PM 10/26/2021  ?  1:40 PM 10/26/2021  ?  1:30 PM  ?Vitals with BMI  ?Height 5' 8"     ?Weight 213 lbs    ?BMI 32.39    ?Systolic 703    ?Diastolic 58    ?Pulse 62 59 59  ?Orthostatic VS for the past 72 hrs (Last 3 readings): ? Patient Position BP Location Cuff Size  ?11/15/21 1434 Sitting Left Arm Normal  ? ?Physical Exam ?Vitals reviewed.  ?Constitutional:   ?   Appearance: He is well-developed. He is obese.  ?Neck:  ?  Vascular: No JVD.  ?Cardiovascular:  ?   Rate and Rhythm: Normal rate and regular rhythm.  ?   Pulses: Intact distal pulses.     ?     Carotid pulses are 2+ on the right side with bruit and 2+ on the left side with bruit. ?     Popliteal pulses are 2+ on the right side and 2+ on the left side.  ?     Dorsalis pedis pulses are 2+ on the right side and 1+ on the left side.  ?     Posterior tibial pulses are 0 on the right side and 0 on the left side.  ?   Heart sounds: Murmur  heard.  ?Early systolic murmur is present with a grade of 2/6 at the upper right sternal border radiating to the apex.  ?  No gallop.  ?   Comments: Arterial access site has a pulsatile mass about 1 cm in diameter with a bruit suggestive of pseudoaneurysm. ?Pulmonary:  ?   Effort: Pulmonary effort is normal.  ?   Breath sounds: Normal breath sounds.  ?Abdominal:  ?   General: Bowel sounds are normal.  ?   Palpations: Abdomen is soft.  ?Musculoskeletal:  ?   Right lower leg: Edema (trace) present.  ?   Left lower leg: Edema (trace) present.  ? ? ?Laboratory examination:  ? ? ?  Latest Ref Rng & Units 10/26/2021  ? 10:28 AM 10/26/2021  ? 10:25 AM 10/26/2021  ? 10:18 AM  ?CMP  ?Sodium 135 - 145 mmol/L 140   141   141    ?Potassium 3.5 - 5.1 mmol/L 3.5   3.6   3.6    ? ? ?  Latest Ref Rng & Units 10/26/2021  ? 10:28 AM 10/26/2021  ? 10:25 AM 10/26/2021  ? 10:18 AM  ?CBC  ?Hemoglobin 13.0 - 17.0 g/dL 9.2   9.9   9.9    ?Hematocrit 39.0 - 52.0 % 27.0   29.0   29.0    ? ? ?Lipid Panel ?No results for input(s): CHOL, TRIG, LDLCALC, VLDL, HDL, CHOLHDL, LDLDIRECT in the last 8760 hours. ?  ?HEMOGLOBIN A1C ?Lab Results  ?Component Value Date  ? HGBA1C 5.8 (H) 05/28/2018  ? MPG 119.76 05/28/2018  ? ?ProBNP (last 3 results) ?Recent Labs  ?  09/03/21 ?1454  ?PROBNP 86.0  ? ? ?External labs: ?09/03/2021: ?D-dimer 3.39 ?High-sensitivity troponin 40 --> 39 ?TSH 0.66 ?Magnesium 1.9 ? ?Cholesterol, total 143.000 m 01/07/2020 ?HDL 45 MG/DL 01/07/2020 ?LDL 70.000 mg 01/07/2020 ?Triglycerides 142.000 01/07/2020 ? ?A1C 6.000 % 01/07/2020 ?TSH 1.000 01/07/2020 ? ?Hemoglobin 10.000 g/d 01/07/2020 ?Platelets 176.000 01/01/2020  ? ?Creatinine, Serum 1.400 mg/ 01/07/2020 ?Potassium 3.700 09/12/2019 ?Magnesium N/D ?ALT (SGPT) 18.000 uni 01/07/2020 ? ?Labs 01/02/2019: A1c 5.8%.  Serum glucose 98 mg, BUN 16, creatinine 1.0, eGFR 72 mL, potassium 3.6, CMP otherwise normal.   ?HB 8.3/HCT 25.9, platelets 182, normal indicis.   ?Total cholesterol 98, triglycerides 57, HDL 42,  LDL 45.  TSH normal. ? ?Allergies  ?No Known Allergies  ? ? ?Final Medications at End of Visit   ? ?Current Outpatient Medications:  ?  amiodarone (PACERONE) 200 MG tablet, Take 200 mg by mouth daily., Disp: , Rfl:  ?  atorvastatin (LIPITOR) 80 MG tablet, TAKE 1 TABLET BY MOUTH  DAILY (Patient taking differently: Take 40 mg by mouth at bedtime.), Disp: 90 tablet, Rfl: 3 ?  brimonidine (ALPHAGAN) 0.2 % ophthalmic solution, Place 1 drop  into the right eye every 8 (eight) hours., Disp: 10 mL, Rfl: 3 ?  Bromfenac Sodium (PROLENSA) 0.07 % SOLN, Place 1 drop into the right eye 4 (four) times daily. (Patient taking differently: Place 1 drop into the right eye 2 (two) times daily.), Disp: 3 mL, Rfl: 3 ?  colesevelam (WELCHOL) 625 MG tablet, Take 625 mg by mouth 3 (three) times daily. , Disp: , Rfl:  ?  donepezil (ARICEPT) 10 MG tablet, Take 10 mg by mouth every morning. , Disp: , Rfl:  ?  dorzolamide-timolol (COSOPT) 22.3-6.8 MG/ML ophthalmic solution, Place 1 drop into the right eye 2 (two) times daily. (Patient taking differently: Place 1 drop into the right eye daily.), Disp: 10 mL, Rfl: 3 ?  esomeprazole (NEXIUM) 40 MG capsule, Take 40 mg by mouth daily. , Disp: , Rfl:  ?  ferrous sulfate 325 (65 FE) MG tablet, TAKE 1 TABLET BY MOUTH EVERY DAY WITH BREAKFAST, Disp: 90 tablet, Rfl: 3 ?  gabapentin (NEURONTIN) 300 MG capsule, Take 300 mg by mouth at bedtime., Disp: , Rfl:  ?  insulin glargine (LANTUS) 100 UNIT/ML injection, Inject 20-40 Units into the skin at bedtime as needed (BGL below 140-150 take 20 units, if 151 or higher take 40 units)., Disp: , Rfl:  ?  Loteprednol Etabonate (LOTEMAX SM) 0.38 % GEL, Place 1 drop into the right eye 4 (four) times daily. (Patient taking differently: Place 1 drop into the right eye 2 (two) times daily.), Disp: 5 g, Rfl: 3 ?  memantine (NAMENDA) 10 MG tablet, Take 1 tablet (10 mg total) by mouth 2 (two) times daily. Please call and make overdue appt for further refills. 2nd attempt  (Patient taking differently: Take 5 mg by mouth 2 (two) times daily. Please call and make overdue appt for further refills. 2nd attempt), Disp: 30 tablet, Rfl: 0 ?  metoprolol tartrate (LOPRESSOR) 25 MG tablet, T

## 2021-12-10 ENCOUNTER — Ambulatory Visit: Payer: Self-pay | Admitting: Cardiology

## 2021-12-10 DIAGNOSIS — T81718A Complication of other artery following a procedure, not elsewhere classified, initial encounter: Secondary | ICD-10-CM

## 2021-12-10 NOTE — Progress Notes (Signed)
Right radial artery pseudoaneurysm is completely resolved, he has a fairly large amount 1.5 cm hematoma that is hard but no further bruit, he has excellent distal pulse.  No further management is indicated.  I have reassured the patient and his wife. ? ?  ICD-10-CM   ?1. Pseudoaneurysm following procedure ([place]) Left radial artery  T81.718A   ? I72.9   ?  ? ? ?

## 2021-12-27 ENCOUNTER — Other Ambulatory Visit: Payer: Self-pay | Admitting: Oncology

## 2021-12-28 ENCOUNTER — Encounter: Payer: Self-pay | Admitting: Oncology

## 2022-01-03 NOTE — Progress Notes (Signed)
[facility] Note  01/05/2022     CHIEF COMPLAINT Patient presents for Retina Follow Up  HISTORY OF PRESENT ILLNESS: Cody Phelps is a 81 y.o. male who presents to the clinic today for:  HPI     Retina Follow Up   Patient presents with  Other.  In right eye.  This started 3 months ago.  I, the attending physician,  performed the HPI with the patient and updated documentation appropriately.        Comments   Patient here for 3 months retina follow up for CME OD. Patient states vision pretty good. No eye pain.       Last edited by [name], MD on 01/08/2022  1:37 AM.    Pt states vision seems stable, he is using Lotemax  BID OD, Prolensa BID OD and Cosopt once a day OD  Referring physician: [name], MD [place],  [place] [id number]  HISTORICAL INFORMATION:   Selected notes from the MEDICAL RECORD NUMBER Referred by Dr. [name] for concern of RD OD   CURRENT MEDICATIONS: Current Outpatient Medications (Ophthalmic Drugs)  Medication Sig   brimonidine (ALPHAGAN) 0.2 % ophthalmic solution Place 1 drop into the right eye every 8 (eight) hours.   Bromfenac Sodium (PROLENSA) 0.07 % SOLN Place 1 drop into the right eye 2 (two) times daily.   dorzolamide-timolol (COSOPT) 22.3-6.8 MG/ML ophthalmic solution Place 1 drop into the right eye daily.   loteprednol (LOTEMAX) 0.2 % SUSP 1 drop 4 (four) times daily.   Loteprednol Etabonate (LOTEMAX SM) 0.38 % GEL Place 1 drop into the right eye 2 (two) times daily.   No current facility-administered medications for this visit. (Ophthalmic Drugs)   Current Outpatient Medications (Other)  Medication Sig   amiodarone (PACERONE) 200 MG tablet Take 200 mg by mouth daily. (Patient not taking: Reported on 01/06/2022)   atorvastatin (LIPITOR) 80 MG tablet TAKE 1 TABLET BY MOUTH  DAILY (Patient taking differently: Take 40 mg by mouth at bedtime.)   colesevelam (WELCHOL) 625 MG tablet Take 625  mg by mouth 3 (three) times daily.    donepezil (ARICEPT) 10 MG tablet Take 10 mg by mouth every morning.    esomeprazole (NEXIUM) 40 MG capsule Take 40 mg by mouth daily.    ferrous sulfate 325 (65 FE) MG tablet TAKE 1 TABLET BY MOUTH EVERY DAY WITH BREAKFAST   gabapentin (NEURONTIN) 300 MG capsule Take 300 mg by mouth at bedtime.   insulin glargine (LANTUS) 100 UNIT/ML injection Inject 20-40 Units into the skin at bedtime as needed (BGL below 140-150 take 20 units, if 151 or higher take 40 units).   memantine (NAMENDA) 10 MG tablet Take 1 tablet (10 mg total) by mouth 2 (two) times daily. Please call and make overdue appt for further refills. 2nd attempt (Patient taking differently: Take 5 mg by mouth 2 (two) times daily. Please call and make overdue appt for further refills. 2nd attempt)   metoprolol tartrate (LOPRESSOR) 25 MG tablet TAKE 1 TABLET BY MOUTH  DAILY (Patient taking differently: Take 12.5 mg by mouth 2 (two) times daily.)   mirabegron ER (MYRBETRIQ) 50 MG TB24 tablet Take 50 mg by mouth at bedtime.    omega-3 acid ethyl esters (LOVAZA) 1 G capsule Take 2 g by mouth daily.   Rivaroxaban (XARELTO) 15 MG TABS tablet Take 1 tablet (15 mg total) by mouth daily with supper.   rOPINIRole (REQUIP) 2 MG  tablet Take 3-4 mg by mouth daily as needed (RLS).   spironolactone (ALDACTONE) 25 MG tablet TAKE ONE-HALF TABLET BY  MOUTH DAILY   tamsulosin (FLOMAX) 0.4 MG CAPS capsule Take 0.4 mg by mouth daily after supper.    valsartan-hydrochlorothiazide (DIOVAN-HCT) 80-12.5 MG tablet TAKE 1 TABLET BY MOUTH IN  THE MORNING   vitamin B-12 (CYANOCOBALAMIN) 1000 MCG tablet Take 1,000 mcg by mouth 2 (two) times daily.   amoxicillin-clavulanate (AUGMENTIN) 875-125 MG tablet Take 1 tablet by mouth 2 (two) times daily for 10 days.   benzonatate (TESSALON) 200 MG capsule Take 1 capsule (200 mg total) by mouth 3 (three) times daily as needed for up to 7 days for cough.   fexofenadine (ALLEGRA ALLERGY) 180 MG  tablet Take 1 tablet (180 mg total) by mouth daily for 15 days.   nitroGLYCERIN (NITROSTAT) 0.4 MG SL tablet Place 1 tablet (0.4 mg total) under the tongue every 5 (five) minutes as needed for up to 25 days for chest pain.   torsemide (DEMADEX) 20 MG tablet Take 1 tablet (20 mg total) by mouth as directed. 2 times a week on Tuesday and Thursday.  Take additional dose as directed with weight gain of 2 Lbs   No current facility-administered medications for this visit. (Other)   REVIEW OF SYSTEMS: ROS   Positive for: Gastrointestinal, Musculoskeletal, Endocrine, Cardiovascular, Eyes, Respiratory Negative for: Constitutional, Neurological, Skin, Genitourinary, HENT, Psychiatric, Allergic/Imm, Heme/Lymph Last edited by [name], COA on 01/05/2022  1:04 PM.     ALLERGIES No Known Allergies  PAST MEDICAL HISTORY Past Medical History:  Diagnosis Date   Acute anterior wall MI ([place]) 03/01/2018   Acute combined systolic and diastolic heart failure ([place]) 03/15/2018   Burn    Chronic diastolic CHF (congestive heart failure) ([place]) 09/21/2018   Defect, retina, with detachment    Dementia ([place])    Diverticulosis    ED (erectile dysfunction)    First degree heart block    GERD (gastroesophageal reflux disease)    Heart murmur    History of blood transfusion    with open heart surgery   Hypertension    cardiologsit-  dr [name]   Hypertensive retinopathy    OU   Iron deficiency anemia    Mixed hyperlipidemia    Morbid obesity ([place])    Myocardial infarction ([place])    02/28/18   OA (osteoarthritis)    right hip   OSA on CPAP    followed by dr dohmeier, uses CPAP   PAF (paroxysmal atrial fibrillation) ([place])    a. on Eliquis   Prostate cancer ([place])    dx 03-17-2017 (bx)  Stage T2a, Gleason 4+4, PSA  4.43, vol 32.32--  s/p radiatctive prostate seed implants 07-10-2017 then IMRT and ADT   Retinal detachment    Rheg. 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   LIMA to Diagonal Branch   S/P Maze operation for atrial fibrillation 05/30/2018   Complete bilateral atrial lesion set using bipolar radiofrequency and cryothermy ablation with clipping of LA appendage   Scoliosis    Trigger finger    Type 2 diabetes mellitus ([place])    Wears partial dentures    upper and lower   Past Surgical History:  Procedure Laterality Date   AORTIC VALVE REPLACEMENT N/A 05/30/2018  Procedure: AORTIC VALVE REPLACEMENT (AVR) using Inspiris Valve, Size 23;  Surgeon: [name], MD;  Location: [place];  Service: Open Heart Surgery;  Laterality: N/A;   APPENDECTOMY  1988   BILATERAL TOTAL ETHMOIDECTOMY AND SPHENOIDECTOMY  01-14-2009    dr Redmond Baseman   [facility]   CARDIOVERSION N/A 08/10/2018   Procedure: CARDIOVERSION;  Surgeon: Adrian Prows, MD;  Location: [place];  Service: Cardiovascular;  Laterality: N/A;   CARPAL TUNNEL RELEASE Bilateral 2013   CATARACT EXTRACTION Bilateral    Dr. [name]   CATARACT EXTRACTION W/ INTRAOCULAR LENS  IMPLANT, BILATERAL  date?   CORONARY ARTERY BYPASS GRAFT N/A 05/30/2018   CORONARY BALLOON ANGIOPLASTY N/A 03/01/2018   Procedure: CORONARY BALLOON ANGIOPLASTY;  Surgeon: Adrian Prows, MD;  Location: [place] [facility];  Service: Cardiovascular;  Laterality: N/A;   CORONARY/GRAFT ACUTE MI REVASCULARIZATION N/A 03/01/2018   Procedure: Coronary/Graft Acute MI Revascularization;  Surgeon: Adrian Prows, MD;  Location: [place] [facility];  Service: Cardiovascular;  Laterality: N/A;   CYSTOSCOPY  07/19/2017   Procedure: CYSTOSCOPY;  Surgeon: Nickie Retort, MD;  Location: [facility];  Service: Urology;;  No seeds found in [place] Bilateral    Cat Sx OU.  Dislocated IOL sx OD.  Rheg. RD repair OS.   EYE SURGERY     GAS/FLUID EXCHANGE Right 06/13/2019   Procedure: GAS/FLUID EXCHANGE;   Surgeon: [name], MD;  Location: [place];  Service: Ophthalmology;  Laterality: Right;   LAMINECTOMY WITH POSTERIOR LATERAL ARTHRODESIS LEVEL 2 N/A 02/12/2014   Procedure: LUMBAR TWO-THREE,LUIMBAR THREE-FOUR LAMINECTOMY/FORAMINOTOMY;POSSIBLE POSTEROLATERAL ARTHRODESIS WITH AUTOGRAFT;  Surgeon: [name], MD;  Location: [facility];  Service: Neurosurgery;  Laterality: N/A;   LEFT HEART CATH AND CORONARY ANGIOGRAPHY N/A 03/01/2018   Procedure: LEFT HEART CATH AND CORONARY ANGIOGRAPHY;  Surgeon: Adrian Prows, MD;  Location: [place] [facility];  Service: Cardiovascular;  Laterality: N/A;   MAZE N/A 05/30/2018   Procedure: MAZE;  Surgeon: [name], MD;  Location: [place];  Service: Open Heart Surgery;  Laterality: N/A;   ORIF RIGHT ANKLE FX'S  05/05/2001   retained hardware   PARS PLANA VITRECTOMY Right 06/13/2019   Procedure: PARS PLANA VITRECTOMY WITH 25 GAUGE WITH LASER AND GAS;  Surgeon: [name], MD;  Location: [place];  Service: Ophthalmology;  Laterality: Right;   PARS PLANA VITRECTOMY Right 09/12/2019   Procedure: Pars Plana Vitrectomy With 25 Gauge;  Surgeon: [name], MD;  Location: [place];  Service: Ophthalmology;  Laterality: Right;   PERFLUORONE INJECTION Right 06/13/2019   Procedure: PERFLUORONE INJECTION;  Surgeon: [name], MD;  Location: [place];  Service: Ophthalmology;  Laterality: Right;   PHOTOCOAGULATION WITH LASER Right 06/13/2019   Procedure: PHOTOCOAGULATION WITH LASER;  Surgeon: [name], MD;  Location: [place];  Service: Ophthalmology;  Laterality: Right;   PLACEMENT AND SUTURE OF SECONDARY INTRAOCULAR LENS Right 09/12/2019   Procedure: PLACEMENT AND SUTURE OF SECONDARY INTRAOCULAR LENS;  Surgeon: [name], MD;  Location: [place];  Service: Ophthalmology;  Laterality: Right;   PROSTATE BIOPSY  03-17-2017   dr Pilar Jarvis office   RADIOACTIVE SEED IMPLANT N/A 07/19/2017   Procedure: RADIOACTIVE SEED IMPLANT/BRACHYTHERAPY IMPLANT;  Surgeon: Nickie Retort, MD;  Location: [facility];  Service: Urology;  Laterality: N/A;  69 seeds implanted   REMOVAL RETAINED LENS Right 09/12/2019   Procedure: REMOVAL RETAINED LENS;  Surgeon: [name], MD;  Location: [place];  Service: Ophthalmology;  Laterality: Right;   RETINAL DETACHMENT SURGERY Right 06/13/2019   Dr. Coralyn Pear   RIGHT/LEFT HEART CATH AND CORONARY/GRAFT ANGIOGRAPHY N/A 10/26/2021   Procedure: RIGHT/LEFT HEART CATH AND CORONARY/GRAFT ANGIOGRAPHY;  Surgeon: Adrian Prows, MD;  Location: [place] [facility];  Service: Cardiovascular;  Laterality: N/A;   SKIN GRAFT     face   SPACE OAR INSTILLATION N/A 07/19/2017   Procedure: SPACE OAR INSTILLATION;  Surgeon: Nickie Retort, MD;  Location: [facility];  Service: Urology;  Laterality: N/A;   TEE WITHOUT CARDIOVERSION  03/20/2012   Procedure: TRANSESOPHAGEAL ECHOCARDIOGRAM (TEE);  Surgeon: Laverda Page, MD;  Location: [facility];  Service: Cardiovascular;  Laterality: N/A;  normal LV; normal EF; normal RV; normal LA w/ left atrial appendage very small, normal function, interatrial septum intact without defect; normal RA; trace MR,TR, & PI; mild AV calcification and senile degeneration w/ mild stenosis, AVA 1.7cm^2;;    TEE WITHOUT CARDIOVERSION N/A 05/30/2018   Procedure: TRANSESOPHAGEAL ECHOCARDIOGRAM (TEE);  Surgeon: [name], MD;  Location: [place];  Service: Open Heart Surgery;  Laterality: N/A;   TOTAL HIP ARTHROPLASTY Right 10/23/2017   Procedure: TOTAL HIP ARTHROPLASTY ANTERIOR APPROACH;  Surgeon: Frederik Pear, MD;  Location: [place];  Service: Orthopedics;  Laterality: Right;   TRANSTHORACIC ECHOCARDIOGRAM  01-11-2017   dr [name]  (per echo note, no significant change in seveity of AS, no other diagnostic change)   moderate concentric LVH, ef 75%, grade 1 diastolic dysfunction/  moderate LAE/  mild , grade 1 AR w/ moderate AV calcification, mild to moderate restricted AV leaflets w/ moderate AS,  AVA 1.16cm^2, peak grandient 3mHg, mean grandient 318mg/  trace MR, mild calcification MV annulus , mild MV leaflet calcification, mild MVS, peak grandient 4.70m13m, mean grandient 2.7mm80m trace TR   FAMILY HISTORY Family History  Problem Relation Age of Onset   Stroke Mother 80  30ypertension Mother    Hypertension Father    Heart disease Father    Heart attack Father 70  76eart murmur Father    Hypertension Sister    Lung disease Brother    Colon cancer Neg Hx    Stomach cancer Neg Hx    Rectal cancer Neg Hx    Pancreatic cancer Neg Hx    SOCIAL HISTORY Social History   Tobacco Use   Smoking status: Former    Packs/day: 0.25    Years: 1.00    Total pack years: 0.25    Types: Cigarettes    Quit date: 02/04/1967    Years since quitting: 54.9   Smokeless tobacco: Never   Tobacco comments:    smoked 1 q2-3 days  Vaping Use   Vaping Use: Never used  Substance Use Topics   Alcohol use: Not Currently    Comment: occasional beer   Drug use: Never       OPHTHALMIC EXAM:  Base Eye Exam     Visual Acuity (Snellen - Linear)       Right Left   Dist [place] 20/40 -2 20/20   Dist ph [place] 20/30 -2          Tonometry (Tonopen, 1:02 PM)       Right Left   Pressure 14 15         Pupils       Dark Light Shape React APD   Right 4 3 Round Minimal None   Left 2 1 Round Minimal None  Visual Fields (Counting fingers)       Left Right    Full Full         Extraocular Movement       Right Left    Full, Ortho Full, Ortho         Neuro/Psych     Oriented x3: Yes   Mood/Affect: Normal         Dilation     Both eyes: 1.0% Mydriacyl, 2.5% Phenylephrine @ 1:02 PM           Slit Lamp and Fundus Exam     Slit Lamp Exam       Right Left   Lids/Lashes mild Meibomian gland dysfunction, mild Telangiectasia Dermatochalasis - upper lid, Meibomian gland dysfunction, Edema, Erythema lower lid, positive concretion left palp conj    Conjunctiva/Sclera White and quiet, STK ST quad -- gone Temporal Pinguecula   Cornea well healed superior cataract wound, 1-2+ fine Punctate epithelial erosions, Arcus Trace Punctate epithelial erosions, trace fine endo pigment   Anterior Chamber Deep and quiet Deep and quiet   Iris Round and dilated to 51m, scattered Transillumination defects 360, +iridodenesis, +pigment deposition, +atrophy Round and dilated   Lens Sutured Akreos IOL well centered 3 piece Posterior chamber intraocular in good position, trace Posterior capsular opacification   Anterior Vitreous post vitrectomy, trace pigment Vitreous syneresis, Posterior vitreous detachment         Fundus Exam       Right Left   Disc 1-2+Pallor, Sharp rim 2+Pallor, Sharp rim, mild PPA/PPP, mild tilt, Compact   C/D Ratio 0.3 0.5   Macula Flat, Blunted foveal reflex, mild RPE mottling and clumping, trace central cystic changes, +pigment deposition on retinal surface -- improved, rare MA, mild ERM Flat, Blunted foveal reflex, Retinal pigment epithelial mottling and clumping, No heme or edema   Vessels attenuated, Tortuous attenuated, Tortuous   Periphery attached, good 360 laser in place; ORIGINALLY: bullous superior retinal detachment from 1000-0130, another more shallow detachment lobe from 130-400, lattice and micro tears ST quadrant; Old retinal tear at 0900 with surrounding laser Attached, peripheral laser scars at 1030, No new RT/RD           Refraction     Wearing Rx       Sphere Cylinder Axis Add   Right -1.25 +1.50 025 +2.75   Left -1.00 +1.75 180 +2.75           IMAGING AND PROCEDURES  Imaging and Procedures for _0 @  OCT, Retina - OU - Both Eyes       Right Eye Quality was good. Central Foveal Thickness: 325. Progression has been stable. Findings include normal foveal contour, no IRF, no SRF (Trace cystic changes nasal fovea, trace ERM).   Left Eye Quality was good. Central Foveal Thickness: 266. Progression  has been stable. Findings include normal foveal contour, no IRF, no SRF.   Notes *Images captured and stored on drive  Diagnosis / Impression:  OD: Trace cystic changes nasal fovea, trace ERM OS: NFP, no IRF/SRF   Clinical management:  See below  Abbreviations: NFP - Normal foveal profile. CME - cystoid macular edema. PED - pigment epithelial detachment. IRF - intraretinal fluid. SRF - subretinal fluid. EZ - ellipsoid zone. ERM - epiretinal membrane. ORA - outer retinal atrophy. ORT - outer retinal tubulation. SRHM - subretinal hyper-reflective material            ASSESSMENT/PLAN:    ICD-10-CM   1. Dislocation of intraocular  lens, sequela  T85.22XS     2. Right retinal detachment  H33.21     3. 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 History of repair of retinal tear by laser photocoagulation  Z98.890     5. Diabetes mellitus type 2 without retinopathy ([place])  E11.9     6. Essential hypertension  I10     7. Hypertensive retinopathy of both eyes  H35.033     8. Pseudophakia of both eyes  Z96.1     9. Cystoid macular edema of right eye  H35.351 OCT, Retina - OU - Both Eyes    10. 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 Dislocated IOL OD  - pt with history of partially dislocated 3-piece IOL -- spontaneously dislocated completely into vitreous cavity on 1.23.21  - s/p 25g PPV w/ IOL explantation and secondary sutured IOL OD -- Akreos AO60 lens, 17.5D -- 02.11.2021             - IOL in good position  - 2 superior nylon sutures removed at slit lamp 03.12.21 -- last nylon suture removed at slit lamp, 04.30.21  - corneal edema resolved; PEE improved             - s/p STK OD #1 (07.09.21), #2 (01.26.22), #3 (08.30.22) for CME  - BCVA 20/30 - stable  - OCT shows trace cystic changes nasal fovea, trace ERM (see #9 below)  - IOP 14  - Ats QID OU              - f/u 3 months --DFE/OCT   2,3. Rhegmatogenous retinal detachment, right eye  - bullous,  superior, mac off detachment, onset of foveal involvement 11.11.20 by history  - Bi-lobed superior detachment, superior lobe spanning 1000-0130, nasal lobe 0130-0400  - lattice degeneration with microtears noted at 1030  - s/p PPV/PFC/EL/FAX/14% C3F8 OD, 11.12.20             - intraop: HST at 2 oclock was found; also detachment had progressed to subtotal detachment spanning 9 oclock to 6 oclock (going in clockwise direction); also severe zonular insufficiency with IOL very mobile throughout case -- did not dislocate completely during the case or immediately post op  - doing well             - retina attached and in good position--good laser in place  4. History of retinal defects s/p laser retinopexy OU with Dr. Zigmund Daniel in 2013  - OD laser at 0900  - OS laser at 1030  - stable  5. Diabetes mellitus, type 2 without retinopathy  - A1c was 6.2 in August 2022  - The incidence, risk factors for progression, natural history and treatment options for diabetic retinopathy  were discussed with patient.    - The need for close monitoring of blood glucose, blood pressure, and serum lipids, avoiding cigarette or any type of tobacco, and the need for long term follow up was also discussed with patient.   6,7. Hypertensive retinopathy OU  - discussed importance of tight BP control  - monitor   8. Pseudophakia OU  - s/p CE/IOL OU (Dr. [name])  - 3 piece IOL OD was completely displaced into vitreous -- now with sutured Akreos IOL in good position OD -- see above  - OS 3-piece IOL in good position  - monitor   9. CME OD  - post op edema-- recurrent  - s/p  STK OD #1 07.09.21, #2 (01.26.22), #3 (08.24.22)  - today, stable improvement in cystic changes nasal fovea   - cont Lotemax SM BID OD and Prolensa BID OD  - cont Cosopt daily OD  - f/u 3 months DFE, OCT  10. Ocular Hypertension OD  - IOP: 14 - ?steroid response -- improved today  - continue Cosopt daily    Ophthalmic Meds Ordered this  visit:  Meds ordered this encounter  Medications   Bromfenac Sodium (PROLENSA) 0.07 % SOLN    Sig: Place 1 drop into the right eye 2 (two) times daily.    Dispense:  3 mL    Refill:  6   Loteprednol Etabonate (LOTEMAX SM) 0.38 % GEL    Sig: Place 1 drop into the right eye 2 (two) times daily.    Dispense:  5 g    Refill:  6   dorzolamide-timolol (COSOPT) 22.3-6.8 MG/ML ophthalmic solution    Sig: Place 1 drop into the right eye daily.    Dispense:  10 mL    Refill:  6     Return in about 3 months (around 04/07/2022) for f/u CME OD, DFE, OCT.  There are no Patient Instructions on file for this visit.  This document serves as a record of services personally performed by Gardiner Sleeper, MD, PhD. It was created on their behalf by Orvan Falconer, an ophthalmic technician. The creation of this record is the provider's dictation and/or activities during the visit.    Electronically signed by: Orvan Falconer, OA, 01/08/22  1:38 AM  This document serves as a record of services personally performed by Gardiner Sleeper, MD, PhD. It was created on their behalf by San Jetty. Owens Shark, OA an ophthalmic technician. The creation of this record is the provider's dictation and/or activities during the visit.    Electronically signed by: San Jetty. Owens Shark, [place] 06.07.2023 1:38 AM  Gardiner Sleeper, M.D., Ph.D. Diseases & Surgery of the Retina and Vitreous [facility]  I have reviewed the above documentation for accuracy and completeness, and I agree with the above. Gardiner Sleeper, M.D., Ph.D. 01/08/22 1:40 AM   Abbreviations: M myopia (nearsighted); A astigmatism; H hyperopia (farsighted); P presbyopia; Mrx spectacle prescription;  CTL contact lenses; OD right eye; OS left eye; OU both eyes  XT exotropia; ET esotropia; PEK punctate epithelial keratitis; PEE punctate epithelial erosions; DES dry eye syndrome; MGD meibomian gland dysfunction; ATs artificial tears; PFAT's preservative  free artificial tears; [place] nuclear sclerotic cataract; PSC posterior subcapsular cataract; ERM epi-retinal membrane; PVD posterior vitreous detachment; RD retinal detachment; DM diabetes mellitus; DR diabetic retinopathy; NPDR non-proliferative diabetic retinopathy; PDR proliferative diabetic retinopathy; CSME clinically significant macular edema; DME diabetic macular edema; dbh dot blot hemorrhages; CWS cotton wool spot; POAG primary open angle glaucoma; C/D cup-to-disc ratio; HVF humphrey visual field; GVF goldmann visual field; OCT optical coherence tomography; IOP intraocular pressure; BRVO Branch retinal vein occlusion; CRVO central retinal vein occlusion; CRAO central retinal artery occlusion; BRAO branch retinal artery occlusion; RT retinal tear; SB scleral buckle; PPV pars plana vitrectomy; VH Vitreous hemorrhage; PRP panretinal laser photocoagulation; IVK intravitreal kenalog; VMT vitreomacular traction; MH Macular hole;  NVD neovascularization of the disc; NVE neovascularization elsewhere; AREDS age related eye disease study; ARMD age related macular degeneration; POAG primary open angle glaucoma; EBMD epithelial/anterior basement membrane dystrophy; ACIOL anterior chamber intraocular lens; IOL intraocular lens; PCIOL posterior chamber intraocular lens; Phaco/IOL phacoemulsification with intraocular lens placement; PRK photorefractive  keratectomy; LASIK laser assisted in situ keratomileusis; HTN hypertension; DM diabetes mellitus; COPD chronic obstructive pulmonary disease

## 2022-01-05 ENCOUNTER — Ambulatory Visit (INDEPENDENT_AMBULATORY_CARE_PROVIDER_SITE_OTHER): Payer: Medicare Other | Admitting: Ophthalmology

## 2022-01-05 ENCOUNTER — Encounter (INDEPENDENT_AMBULATORY_CARE_PROVIDER_SITE_OTHER): Payer: Self-pay | Admitting: Ophthalmology

## 2022-01-05 DIAGNOSIS — H40051 Ocular hypertension, right eye: Secondary | ICD-10-CM

## 2022-01-05 DIAGNOSIS — H35033 Hypertensive retinopathy, bilateral: Secondary | ICD-10-CM | POA: Diagnosis not present

## 2022-01-05 DIAGNOSIS — Z9889 Other specified postprocedural states: Secondary | ICD-10-CM

## 2022-01-05 DIAGNOSIS — Z961 Presence of intraocular lens: Secondary | ICD-10-CM

## 2022-01-05 DIAGNOSIS — I10 Essential (primary) hypertension: Secondary | ICD-10-CM | POA: Diagnosis not present

## 2022-01-05 DIAGNOSIS — H35351 Cystoid macular degeneration, right eye: Secondary | ICD-10-CM | POA: Diagnosis not present

## 2022-01-05 DIAGNOSIS — H35411 Lattice degeneration of retina, right eye: Secondary | ICD-10-CM | POA: Diagnosis not present

## 2022-01-05 DIAGNOSIS — H3321 Serous retinal detachment, right eye: Secondary | ICD-10-CM | POA: Diagnosis not present

## 2022-01-05 DIAGNOSIS — E119 Type 2 diabetes mellitus without complications: Secondary | ICD-10-CM

## 2022-01-05 DIAGNOSIS — T8522XS Displacement of intraocular lens, sequela: Secondary | ICD-10-CM

## 2022-01-05 MED ORDER — PROLENSA 0.07 % OP SOLN: 1.0000 [drp] | Freq: Two times a day (BID) | OPHTHALMIC | 6 refills | Status: DC

## 2022-01-05 MED ORDER — DORZOLAMIDE HCL-TIMOLOL MAL 2-0.5 % OP SOLN: 1.0000 [drp] | Freq: Every day | OPHTHALMIC | 6 refills | Status: DC

## 2022-01-05 MED ORDER — LOTEMAX SM 0.38 % OP GEL: 1.0000 [drp] | Freq: Two times a day (BID) | OPHTHALMIC | 6 refills | Status: DC

## 2022-01-06 ENCOUNTER — Emergency Department (INDEPENDENT_AMBULATORY_CARE_PROVIDER_SITE_OTHER)
Admission: EM | Admit: 2022-01-06 | Discharge: 2022-01-06 | Disposition: A | Discharge status: Home / Self Care | Payer: Medicare Other | Source: Home / Self Care

## 2022-01-06 ENCOUNTER — Emergency Department (INDEPENDENT_AMBULATORY_CARE_PROVIDER_SITE_OTHER): Payer: Medicare Other

## 2022-01-06 DIAGNOSIS — J0100 Acute maxillary sinusitis, unspecified: Secondary | ICD-10-CM

## 2022-01-06 DIAGNOSIS — R059 Cough, unspecified: Secondary | ICD-10-CM

## 2022-01-06 DIAGNOSIS — R0982 Postnasal drip: Secondary | ICD-10-CM | POA: Diagnosis not present

## 2022-01-06 DIAGNOSIS — J309 Allergic rhinitis, unspecified: Secondary | ICD-10-CM

## 2022-01-06 MED ORDER — FEXOFENADINE HCL 180 MG PO TABS: 180.0000 mg | ORAL_TABLET | Freq: Every day | ORAL | 0 refills | Status: DC

## 2022-01-06 MED ORDER — BENZONATATE 200 MG PO CAPS: 200.0000 mg | ORAL_CAPSULE | Freq: Three times a day (TID) | ORAL | 0 refills | Status: AC | PRN

## 2022-01-06 MED ORDER — AMOXICILLIN-POT CLAVULANATE 875-125 MG PO TABS: 1.0000 | ORAL_TABLET | Freq: Two times a day (BID) | ORAL | 0 refills | Status: AC

## 2022-01-06 NOTE — Discharge Instructions (Addendum)
Advised patient/wife of chest x-ray results with hard copy provided.  Instructed patient to take medication as directed with food to completion.  Advised patient to take Allegra with first dose of Augmentin for the next 5 of 10 days.  Advised may use Allegra as needed afterwards for concurrent postnasal drainage/drip.  Advised may use Tessalon Per[place] daily or as needed for cough.  Encouraged patient to increase daily water intake while taking these medications.  Advised patient if symptoms worsen and/or unresolved please follow-up with PCP or here for further evaluation.

## 2022-01-06 NOTE — ED Triage Notes (Signed)
Pt presents with c/o nasal drainage and cough x 3 weeks. Pt has been taking allegra, Claritin and sudafed without relief.

## 2022-01-06 NOTE — ED Provider Notes (Signed)
[facility]    CSN: [id number] Arrival date & time: 01/06/22  1043      History   Chief Complaint Chief Complaint  Patient presents with   Cough    HPI Cody Phelps is a 81 y.o. male.   HPI Pleasant 81 year old male presents with cough and nasal drainage for 3 weeks.  Reports taking OTC Allegra, Sudafed and Claritin with little to no relief.  PMH significant for acute combined systolic/diastolic heart failure, s/p MI (CABG x 1), and dementia.  Past Medical History:  Diagnosis Date   Acute anterior wall MI ([place]) 03/01/2018   Acute combined systolic and diastolic heart failure ([place]) 03/15/2018   Burn    Chronic diastolic CHF (congestive heart failure) ([place]) 09/21/2018   Defect, retina, with detachment    Dementia ([place])    Diverticulosis    ED (erectile dysfunction)    First degree heart block    GERD (gastroesophageal reflux disease)    Heart murmur    History of blood transfusion    with open heart surgery   Hypertension    cardiologsit-  dr [name]   Hypertensive retinopathy    OU   Iron deficiency anemia    Mixed hyperlipidemia    Morbid obesity ([place])    Myocardial infarction ([place])    02/28/18   OA (osteoarthritis)    right hip   OSA on CPAP    followed by dr dohmeier, uses CPAP   PAF (paroxysmal atrial fibrillation) ([place])    a. on Eliquis   Prostate cancer ([place])    dx 03-17-2017 (bx)  Stage T2a, Gleason 4+4, PSA  4.43, vol 32.32--  s/p radiatctive prostate seed implants 07-10-2017 then IMRT and ADT   Retinal detachment    Rheg. 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   LIMA to Diagonal Branch   S/P Maze operation for atrial fibrillation 05/30/2018   Complete bilateral atrial lesion set using bipolar radiofrequency and cryothermy ablation with clipping of LA appendage   Scoliosis     Trigger finger    Type 2 diabetes mellitus ([place])    Wears partial dentures    upper and lower    Patient Active Problem List   Diagnosis Date Noted   Other secondary pulmonary hypertension ([place])    Dyspnea on exertion 10/23/2021   Near syncope 09/04/2021   Elevated troponin 09/04/2021   Chronic diastolic CHF (congestive heart failure) ([place]) 09/21/2018   S/P aortic valve replacement with bioprosthetic valve 05/30/2018   S/P CABG x 1 05/30/2018   S/P Maze operation for atrial fibrillation 05/30/2018   Anemia, normocytic normochromic 05/21/2018   CAD (coronary artery disease), native coronary artery    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   Pericardial effusion 03/20/2018   Paroxysmal atrial fibrillation ([place]) 03/05/2018   Acute MI anterior wall first episode care ([place]) 03/01/2018   Primary osteoarthritis of right hip 10/23/2017   Osteoarthritis of right hip 10/18/2017   Malignant neoplasm of prostate ([place]) 04/21/2017   RLS (restless legs syndrome) 11/20/2014   Lumbar stenosis with neurogenic claudication 02/12/2014   Obstructive sleep apnea 11/25/2013   HTN (hypertension) 11/25/2013   Lymphadenopathy 05/04/2012   OTHER CHRONIC SINUSITIS 08/08/2008   FOOT PAIN, RIGHT 06/10/2008   Type II diabetes mellitus ([place]) 08/31/2006   Essential hypertension 08/31/2006  DIVERTICULOSIS, COLON 08/31/2006   LEG CRAMPS 08/31/2006    Past Surgical History:  Procedure Laterality Date   AORTIC VALVE REPLACEMENT N/A 05/30/2018   Procedure: AORTIC VALVE REPLACEMENT (AVR) using Inspiris Valve, Size 23;  Surgeon: [name], MD;  Location: [facility];  Service: Open Heart Surgery;  Laterality: N/A;   APPENDECTOMY  1988   BILATERAL TOTAL ETHMOIDECTOMY AND SPHENOIDECTOMY  01-14-2009    dr Redmond Baseman   [facility]   CARDIOVERSION N/A 08/10/2018   Procedure: CARDIOVERSION;  Surgeon: Adrian Prows, MD;  Location: [place];  Service: Cardiovascular;  Laterality: N/A;   CARPAL TUNNEL RELEASE Bilateral 2013    CATARACT EXTRACTION Bilateral    Dr. [name]   CATARACT EXTRACTION W/ INTRAOCULAR LENS  IMPLANT, BILATERAL  date?   CORONARY ARTERY BYPASS GRAFT N/A 05/30/2018   CORONARY BALLOON ANGIOPLASTY N/A 03/01/2018   Procedure: CORONARY BALLOON ANGIOPLASTY;  Surgeon: Adrian Prows, MD;  Location: [place] [facility];  Service: Cardiovascular;  Laterality: N/A;   CORONARY/GRAFT ACUTE MI REVASCULARIZATION N/A 03/01/2018   Procedure: Coronary/Graft Acute MI Revascularization;  Surgeon: Adrian Prows, MD;  Location: [place] [facility];  Service: Cardiovascular;  Laterality: N/A;   CYSTOSCOPY  07/19/2017   Procedure: CYSTOSCOPY;  Surgeon: Nickie Retort, MD;  Location: [facility];  Service: Urology;;  No seeds found in [place] Bilateral    Cat Sx OU.  Dislocated IOL sx OD.  Rheg. RD repair OS.   EYE SURGERY     GAS/FLUID EXCHANGE Right 06/13/2019   Procedure: GAS/FLUID EXCHANGE;  Surgeon: [name], MD;  Location: [place];  Service: Ophthalmology;  Laterality: Right;   LAMINECTOMY WITH POSTERIOR LATERAL ARTHRODESIS LEVEL 2 N/A 02/12/2014   Procedure: LUMBAR TWO-THREE,LUIMBAR THREE-FOUR LAMINECTOMY/FORAMINOTOMY;POSSIBLE POSTEROLATERAL ARTHRODESIS WITH AUTOGRAFT;  Surgeon: [name], MD;  Location: [facility];  Service: Neurosurgery;  Laterality: N/A;   LEFT HEART CATH AND CORONARY ANGIOGRAPHY N/A 03/01/2018   Procedure: LEFT HEART CATH AND CORONARY ANGIOGRAPHY;  Surgeon: Adrian Prows, MD;  Location: [place] [facility];  Service: Cardiovascular;  Laterality: N/A;   MAZE N/A 05/30/2018   Procedure: MAZE;  Surgeon: [name], MD;  Location: [place];  Service: Open Heart Surgery;  Laterality: N/A;   ORIF RIGHT ANKLE FX'S  05/05/2001   retained hardware   PARS PLANA VITRECTOMY Right 06/13/2019   Procedure: PARS PLANA VITRECTOMY WITH 25 GAUGE WITH LASER AND GAS;  Surgeon: [name], MD;  Location: [place];  Service: Ophthalmology;  Laterality: Right;   PARS PLANA VITRECTOMY  Right 09/12/2019   Procedure: Pars Plana Vitrectomy With 25 Gauge;  Surgeon: [name], MD;  Location: [place];  Service: Ophthalmology;  Laterality: Right;   PERFLUORONE INJECTION Right 06/13/2019   Procedure: PERFLUORONE INJECTION;  Surgeon: [name], MD;  Location: [place];  Service: Ophthalmology;  Laterality: Right;   PHOTOCOAGULATION WITH LASER Right 06/13/2019   Procedure: PHOTOCOAGULATION WITH LASER;  Surgeon: [name], MD;  Location: [place];  Service: Ophthalmology;  Laterality: Right;   PLACEMENT AND SUTURE OF SECONDARY INTRAOCULAR LENS Right 09/12/2019   Procedure: PLACEMENT AND SUTURE OF SECONDARY INTRAOCULAR LENS;  Surgeon: [name], MD;  Location: [place];  Service: Ophthalmology;  Laterality: Right;   PROSTATE BIOPSY  03-17-2017   dr Pilar Jarvis office   RADIOACTIVE SEED IMPLANT N/A 07/19/2017   Procedure: RADIOACTIVE SEED IMPLANT/BRACHYTHERAPY IMPLANT;  Surgeon: Nickie Retort, MD;  Location: [facility];  Service: Urology;  Laterality: N/A;  71  seeds implanted   REMOVAL RETAINED LENS Right 09/12/2019   Procedure: REMOVAL RETAINED LENS;  Surgeon: [name], MD;  Location: [place];  Service: Ophthalmology;  Laterality: Right;   RETINAL DETACHMENT SURGERY Right 06/13/2019   Dr. Coralyn Pear   RIGHT/LEFT HEART CATH AND CORONARY/GRAFT ANGIOGRAPHY N/A 10/26/2021   Procedure: RIGHT/LEFT HEART CATH AND CORONARY/GRAFT ANGIOGRAPHY;  Surgeon: Adrian Prows, MD;  Location: [place] [facility];  Service: Cardiovascular;  Laterality: N/A;   SKIN GRAFT     face   SPACE OAR INSTILLATION N/A 07/19/2017   Procedure: SPACE OAR INSTILLATION;  Surgeon: Nickie Retort, MD;  [facility];  Service: Urology;  Laterality: N/A;   TEE WITHOUT CARDIOVERSION  03/20/2012   Procedure: TRANSESOPHAGEAL ECHOCARDIOGRAM (TEE);  Surgeon: Laverda Page, MD;  Location: [facility];  Service: Cardiovascular;  Laterality: N/A;  normal LV; normal EF; normal RV; normal LA  w/ left atrial appendage very small, normal function, interatrial septum intact without defect; normal RA; trace MR,TR, & PI; mild AV calcification and senile degeneration w/ mild stenosis, AVA 1.7cm^2;;    TEE WITHOUT CARDIOVERSION N/A 05/30/2018   Procedure: TRANSESOPHAGEAL ECHOCARDIOGRAM (TEE);  Surgeon: [name], MD;  Location: [place];  Service: Open Heart Surgery;  Laterality: N/A;   TOTAL HIP ARTHROPLASTY Right 10/23/2017   Procedure: TOTAL HIP ARTHROPLASTY ANTERIOR APPROACH;  Surgeon: Frederik Pear, MD;  Location: [place];  Service: Orthopedics;  Laterality: Right;   TRANSTHORACIC ECHOCARDIOGRAM  01-11-2017   dr [name]  (per echo note, no significant change in seveity of AS, no other diagnostic change)   moderate concentric LVH, ef 03%, grade 1 diastolic dysfunction/  moderate LAE/  mild , grade 1 AR w/ moderate AV calcification, mild to moderate restricted AV leaflets w/ moderate AS, AVA 1.16cm^2, peak grandient 27mHg, mean grandient 389mg/  trace MR, mild calcification MV annulus , mild MV leaflet calcification, mild MVS, peak grandient 4.99m43m, mean grandient 2.7mm31m trace TR       Home Medications    Prior to Admission medications   Medication Sig Start Date End Date Taking? Authorizing Provider  amoxicillin-clavulanate (AUGMENTIN) 875-125 MG tablet Take 1 tablet by mouth 2 (two) times daily for 10 days. 01/06/22 01/16/22 Yes RagaEliezer LoftsP  benzonatate (TESSALON) 200 MG capsule Take 1 capsule (200 mg total) by mouth 3 (three) times daily as needed for up to 7 days for cough. 01/06/22 01/13/22 Yes RagaEliezer LoftsP  fexofenadine ([facility]) 180 MG tablet Take 1 tablet (180 mg total) by mouth daily for 15 days. 01/06/22 01/21/22 Yes RagaEliezer LoftsP  loteprednol (LOTEMAX) 0.2 % SUSP 1 drop 4 (four) times daily.   Yes [provider]  amiodarone (PACERONE) 200 MG tablet Take 200 mg by mouth daily. Patient not taking: Reported on 01/06/2022    [provider]   atorvastatin (LIPITOR) 80 MG tablet TAKE 1 TABLET BY MOUTH  DAILY Patient taking differently: Take 40 mg by mouth at bedtime. 10/04/19   GanjAdrian Prows  brimonidine (ALPHAGAN) 0.2 % ophthalmic solution Place 1 drop into the right eye every 8 (eight) hours. 10/29/21   ZamoBernarda Caffey  Bromfenac Sodium (PROLENSA) 0.07 % SOLN Place 1 drop into the right eye 2 (two) times daily. 01/05/22   ZamoBernarda Caffey  colesevelam (WELSt [facility] Health Center5 MG tablet Take 625 mg by mouth 3 (three) times daily.     [provider]  donepezil (ARICEPT) 10 MG tablet Take 10 mg by mouth every morning.  03/19/12   [provider]  dorzolamide-timolol (COSOPT) 22.3-6.8 MG/ML ophthalmic solution Place 1 drop into the right eye daily. 01/05/22   [name], MD  esomeprazole (NEXIUM) 40 MG capsule Take 40 mg by mouth daily.  06/18/19   [provider]  ferrous sulfate 325 (65 FE) MG tablet TAKE 1 TABLET BY MOUTH EVERY DAY WITH BREAKFAST 12/28/21   Wyatt Portela, MD  gabapentin (NEURONTIN) 300 MG capsule Take 300 mg by mouth at bedtime. 04/21/20   [provider]  insulin glargine (LANTUS) 100 UNIT/ML injection Inject 20-40 Units into the skin at bedtime as needed (BGL below 140-150 take 20 units, if 151 or higher take 40 units).    [provider]  Loteprednol Etabonate (LOTEMAX SM) 0.38 % GEL Place 1 drop into the right eye 2 (two) times daily. 01/05/22   [name], MD  memantine (NAMENDA) 10 MG tablet Take 1 tablet (10 mg total) by mouth 2 (two) times daily. Please call and make overdue appt for further refills. 2nd attempt Patient taking differently: Take 5 mg by mouth 2 (two) times daily. Please call and make overdue appt for further refills. 2nd attempt 10/19/21   [name], Asencion Partridge, MD  metoprolol tartrate (LOPRESSOR) 25 MG tablet TAKE 1 TABLET BY MOUTH  DAILY Patient taking differently: Take 12.5 mg by mouth 2 (two) times daily. 03/16/20   Adrian Prows, MD  mirabegron ER (MYRBETRIQ) 50 MG  TB24 tablet Take 50 mg by mouth at bedtime.     [provider]  nitroGLYCERIN (NITROSTAT) 0.4 MG SL tablet Place 1 tablet (0.4 mg total) under the tongue every 5 (five) minutes as needed for up to 25 days for chest pain. 03/03/21 11/15/21  Adrian Prows, MD  omega-3 acid ethyl esters (LOVAZA) 1 G capsule Take 2 g by mouth daily.    [provider]  Rivaroxaban (XARELTO) 15 MG TABS tablet Take 1 tablet (15 mg total) by mouth daily with supper. 10/27/21   Adrian Prows, MD  rOPINIRole (REQUIP) 2 MG tablet Take 3-4 mg by mouth daily as needed (RLS).    [provider]  spironolactone (ALDACTONE) 25 MG tablet TAKE ONE-HALF TABLET BY  MOUTH DAILY 07/27/20   Adrian Prows, MD  tamsulosin (FLOMAX) 0.4 MG CAPS capsule Take 0.4 mg by mouth daily after supper.     [provider]  torsemide (DEMADEX) 20 MG tablet Take 1 tablet (20 mg total) by mouth as directed. 2 times a week on Tuesday and Thursday.  Take additional dose as directed with weight gain of 2 Lbs 03/03/21 11/15/21  Adrian Prows, MD  valsartan-hydrochlorothiazide (DIOVAN-HCT) 80-12.5 MG tablet TAKE 1 TABLET BY MOUTH IN  THE MORNING 04/06/21   Adrian Prows, MD  vitamin B-12 (CYANOCOBALAMIN) 1000 MCG tablet Take 1,000 mcg by mouth 2 (two) times daily.    [provider]    Family History Family History  Problem Relation Age of Onset   Stroke Mother 67   Hypertension Mother    Hypertension Father    Heart disease Father    Heart attack Father 64   Heart murmur Father    Hypertension Sister    Lung disease Brother    Colon cancer Neg Hx    Stomach cancer Neg Hx    Rectal cancer Neg Hx    Pancreatic cancer Neg Hx     Social History Social History   Tobacco Use   Smoking status: Former    Packs/day: 0.25  Years: 1.00    Total pack years: 0.25    Types: Cigarettes    Quit date: 02/04/1967    Years since quitting: 54.9   Smokeless tobacco: Never   Tobacco comments:    smoked 1 q2-3 days  Vaping Use    Vaping Use: Never used  Substance Use Topics   Alcohol use: Not Currently    Comment: occasional beer   Drug use: Never     Allergies   Patient has no known allergies.   Review of Systems Review of Systems  HENT:  Positive for congestion.   Respiratory:  Positive for cough.   All other systems reviewed and are negative.    Physical Exam Triage Vital Signs ED Triage Vitals  Enc Vitals Group     BP 01/06/22 1045 (!) 153/64     Pulse Rate 01/06/22 1045 62     Resp 01/06/22 1045 16     Temp 01/06/22 1045 98.9 F (37.2 C)     Temp Source 01/06/22 1045 Oral     SpO2 01/06/22 1045 97 %     Weight --      Height --      Head Circumference --      Peak Flow --      Pain Score 01/06/22 1047 0     Pain Loc --      Pain Edu? --      Excl. in [place]? --    No data found.  Updated Vital Signs BP (!) 153/64 (BP Location: Right Arm)   Pulse 62   Temp 98.9 F (37.2 C) (Oral)   Resp 16   SpO2 97%       Physical Exam Vitals and nursing note reviewed.  Constitutional:      General: He is not in acute distress.    Appearance: He is obese. He is not ill-appearing.  HENT:     Head: Normocephalic and atraumatic.     Right Ear: Tympanic membrane and external ear normal.     Left Ear: Tympanic membrane and external ear normal.     Ears:     Comments: Moderate eustachian tube dysfunction noted bilaterally    Nose:     Comments: Turbinates are erythematous/edematous    Mouth/Throat:     Mouth: Mucous membranes are moist.     Pharynx: Oropharynx is clear.     Comments: Moderate amount of clear drainage of posterior oropharynx noted Eyes:     Extraocular Movements: Extraocular movements intact.     Conjunctiva/sclera: Conjunctivae normal.     Pupils: Pupils are equal, round, and reactive to light.  Cardiovascular:     Rate and Rhythm: Normal rate and regular rhythm.     Pulses: Normal pulses.     Heart sounds: Normal heart sounds. No murmur heard. Pulmonary:     Effort:  Pulmonary effort is normal.     Breath sounds: Normal breath sounds. No wheezing, rhonchi or rales.     Comments: Infrequent nonproductive cough noted on exam Musculoskeletal:     Cervical back: Normal range of motion and neck supple.  Skin:    General: Skin is warm and dry.  Neurological:     General: No focal deficit present.     Mental Status: He is alert and oriented to person, place, and time. Mental status is at baseline.      UC Treatments / Results  Labs (all labs ordered are listed, but only abnormal results are displayed) Labs  Reviewed - No data to display  EKG   Radiology DG Chest 2 View  Result Date: 01/06/2022 CLINICAL DATA:  Cough and nasal drainage EXAM: CHEST - 2 VIEW COMPARISON:  09/03/2021 FINDINGS: Stable heart size and previous median sternotomy. Aortic valve and left atrial appendage clip again noted. Prominent central vascularity and basilar atelectasis versus scarring. No CHF pattern or definite pneumonia. Negative for effusion or pneumothorax. Trachea midline. Degenerative changes of the spine. IMPRESSION: Stable postoperative findings. No significant interval change or acute finding by plain radiography. Electronically Signed   By: Jerilynn Mages.  Shick M.D.   On: 01/06/2022 11:30   OCT, Retina - OU - Both Eyes  Result Date: 01/05/2022 Right Eye Quality was good. Central Foveal Thickness: 325. Progression has been stable. Findings include normal foveal contour, no SRF, no IRF (Trace cystic changes nasal fovea, trace ERM). Left Eye Quality was good. Central Foveal Thickness: 266. Progression has been stable. Findings include normal foveal contour, no IRF, no SRF. Notes *Images captured and stored on drive Diagnosis / Impression: OD: Trace cystic changes nasal fovea, trace ERM OS: NFP, no IRF/SRF Clinical management: See below Abbreviations: NFP - Normal foveal profile. CME - cystoid macular edema. PED - pigment epithelial detachment. IRF - intraretinal fluid. SRF - subretinal  fluid. EZ - ellipsoid zone. ERM - epiretinal membrane. ORA - outer retinal atrophy. ORT - outer retinal tubulation. SRHM - subretinal hyper-reflective material    Procedures Procedures (including critical care time)  Medications Ordered in UC Medications - No data to display  Initial Impression / Assessment and Plan / UC Course  I have reviewed the triage vital signs and the nursing notes.  Pertinent labs & imaging results that were available during my care of the patient were reviewed by me and considered in my medical decision making (see chart for details).     MDM: 1.  Acute maxillary sinusitis, recurrence not specified-Augmentin; 2.  Cough-CXR revealed above, Rx'd Tessalon Perles; 3.  Allergic rhinitis-Rx'd Allegra. Advised patient/wife of chest x-ray results with hard copy provided.  Instructed patient to take medication as directed with food to completion.  Advised patient to take Allegra with first dose of Augmentin for the next 5 of 10 days.  Advised may use Allegra as needed afterwards for concurrent postnasal drainage/drip.  Advised may use Tessalon Perles daily or as needed for cough.  Encouraged patient to increase daily water intake while taking these medications.  Advised patient if symptoms worsen and/or unresolved please follow-up with PCP or here for further evaluation.  Patient discharged home, hemodynamically stable. Final Clinical Impressions(s) / UC Diagnoses   Final diagnoses:  Cough, unspecified type  Acute maxillary sinusitis, recurrence not specified  Allergic rhinitis, unspecified seasonality, unspecified trigger     Discharge Instructions      Advised patient/wife of chest x-ray results with hard copy provided.  Instructed patient to take medication as directed with food to completion.  Advised patient to take Allegra with first dose of Augmentin for the next 5 of 10 days.  Advised may use Allegra as needed afterwards for concurrent postnasal drainage/drip.   Advised may use Tessalon Perles daily or as needed for cough.  Encouraged patient to increase daily water intake while taking these medications.  Advised patient if symptoms worsen and/or unresolved please follow-up with PCP or here for further evaluation.     ED Prescriptions     Medication Sig Dispense Auth. Provider   amoxicillin-clavulanate (AUGMENTIN) 875-125 MG tablet Take 1 tablet by mouth  2 (two) times daily for 10 days. 20 tablet Eliezer Lofts, FNP   fexofenadine [facility] ALLERGY) 180 MG tablet Take 1 tablet (180 mg total) by mouth daily for 15 days. 15 tablet Eliezer Lofts, FNP   benzonatate (TESSALON) 200 MG capsule Take 1 capsule (200 mg total) by mouth 3 (three) times daily as needed for up to 7 days for cough. 40 capsule Eliezer Lofts, FNP      PDMP not reviewed this encounter.   Eliezer Lofts, [place] 01/06/22 1204

## 2022-01-08 ENCOUNTER — Encounter (INDEPENDENT_AMBULATORY_CARE_PROVIDER_SITE_OTHER): Payer: Self-pay | Admitting: Ophthalmology

## 2022-01-17 ENCOUNTER — Telehealth: Payer: Self-pay | Admitting: Oncology

## 2022-01-17 NOTE — Telephone Encounter (Signed)
Called patient regarding upcoming July appointment, patient is notified. 

## 2022-02-08 ENCOUNTER — Other Ambulatory Visit: Payer: Self-pay

## 2022-02-08 ENCOUNTER — Inpatient Hospital Stay: Payer: Medicare Other | Attending: Internal Medicine

## 2022-02-08 ENCOUNTER — Inpatient Hospital Stay (HOSPITAL_BASED_OUTPATIENT_CLINIC_OR_DEPARTMENT_OTHER): Payer: Medicare Other | Admitting: Oncology

## 2022-02-08 VITALS — BP 137/57 | HR 62 | Temp 97.8°F | Resp 17 | Ht 68.0 in | Wt 204.8 lb

## 2022-02-08 DIAGNOSIS — C61 Malignant neoplasm of prostate: Secondary | ICD-10-CM | POA: Diagnosis not present

## 2022-02-08 DIAGNOSIS — D649 Anemia, unspecified: Secondary | ICD-10-CM | POA: Diagnosis not present

## 2022-02-08 DIAGNOSIS — D509 Iron deficiency anemia, unspecified: Secondary | ICD-10-CM | POA: Insufficient documentation

## 2022-02-08 DIAGNOSIS — D638 Anemia in other chronic diseases classified elsewhere: Secondary | ICD-10-CM | POA: Diagnosis not present

## 2022-02-08 LAB — CBC WITH DIFFERENTIAL (CANCER CENTER ONLY)
Abs Immature Granulocytes: 0.03 10*3/uL (ref 0.00–0.07)
Basophils Absolute: 0.1 10*3/uL (ref 0.0–0.1)
Basophils Relative: 1 %
Eosinophils Absolute: 0.4 10*3/uL (ref 0.0–0.5)
Eosinophils Relative: 6 %
HCT: 27.7 % — ABNORMAL LOW (ref 39.0–52.0)
Hemoglobin: 9.2 g/dL — ABNORMAL LOW (ref 13.0–17.0)
Immature Granulocytes: 1 %
Lymphocytes Relative: 19 %
Lymphs Abs: 1.1 10*3/uL (ref 0.7–4.0)
MCH: 28.4 pg (ref 26.0–34.0)
MCHC: 33.2 g/dL (ref 30.0–36.0)
MCV: 85.5 fL (ref 80.0–100.0)
Monocytes Absolute: 0.5 10*3/uL (ref 0.1–1.0)
Monocytes Relative: 10 %
Neutro Abs: 3.5 10*3/uL (ref 1.7–7.7)
Neutrophils Relative %: 63 %
Platelet Count: 203 10*3/uL (ref 150–400)
RBC: 3.24 MIL/uL — ABNORMAL LOW (ref 4.22–5.81)
RDW: 13.8 % (ref 11.5–15.5)
WBC Count: 5.6 10*3/uL (ref 4.0–10.5)
nRBC: 0 % (ref 0.0–0.2)

## 2022-02-08 LAB — FERRITIN: Ferritin: 172 ng/mL (ref 24–336)

## 2022-02-08 LAB — VITAMIN B12: Vitamin B-12: 1045 pg/mL — ABNORMAL HIGH (ref 180–914)

## 2022-02-08 NOTE — Progress Notes (Signed)
Hematology and Oncology Follow Up Visit  Cody Phelps [id number] July 29, 1941 81 y.o. 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  Secondary diagnosis: Prostate cancer presented with localized disease Gleason score of 4+4 = 8 and a PSA 4.4 in 2018.  Prior Therapy:  Definitive radiation therapy utilizing IMRT between August 15, 2017 and September 18, 2017.  He also received brachytherapy boost upfront on January 4 of 2019.  He received 45 Gy in 25 fraction to supplement his post implant of 110 Gy.  He completed androgen deprivation therapy under the care of Dr. Junious Phelps for close to 2 years.  He status post IV iron infusion in August 2019.  He received Feraheme in November 2020.  Current therapy: Oral iron and repeat IV iron infusion as needed.  Interim History: [name] returns today for a follow-up.  Since the last visit, he reports feeling well without any major complaints.  He has reported some mild fatigue and tiredness but no hematochezia, melena or hemoptysis.  Continues to be on oral iron supplements.  He did undergo a catheterization and developed a hematoma on his left wrist.       Medications: Updated on review. Current Outpatient Medications  Medication Sig Dispense Refill   amiodarone (PACERONE) 200 MG tablet Take 200 mg by mouth daily. (Patient not taking: Reported on 01/06/2022)     atorvastatin (LIPITOR) 80 MG tablet TAKE 1 TABLET BY MOUTH  DAILY (Patient taking differently: Take 40 mg by mouth at bedtime.) 90 tablet 3   brimonidine (ALPHAGAN) 0.2 % ophthalmic solution Place 1 drop into the right eye every 8 (eight) hours. 10 mL 3   Bromfenac Sodium (PROLENSA) 0.07 % SOLN Place 1 drop into the right eye 2 (two) times daily. 3 mL 6   colesevelam (WELCHOL) 625 MG tablet Take 625 mg by mouth 3 (three) times daily.      donepezil (ARICEPT)  10 MG tablet Take 10 mg by mouth every morning.      dorzolamide-timolol (COSOPT) 22.3-6.8 MG/ML ophthalmic solution Place 1 drop into the right eye daily. 10 mL 6   esomeprazole (NEXIUM) 40 MG capsule Take 40 mg by mouth daily.      ferrous sulfate 325 (65 FE) MG tablet TAKE 1 TABLET BY MOUTH EVERY DAY WITH BREAKFAST 90 tablet 3   fexofenadine (ALLEGRA ALLERGY) 180 MG tablet Take 1 tablet (180 mg total) by mouth daily for 15 days. 15 tablet 0   gabapentin (NEURONTIN) 300 MG capsule Take 300 mg by mouth at bedtime.     insulin glargine (LANTUS) 100 UNIT/ML injection Inject 20-40 Units into the skin at bedtime as needed (BGL below 140-150 take 20 units, if 151 or higher take 40 units).     loteprednol (LOTEMAX) 0.2 % SUSP 1 drop 4 (four) times daily.     Loteprednol Etabonate (LOTEMAX SM) 0.38 % GEL Place 1 drop into the right eye 2 (two) times daily. 5 g 6   memantine (NAMENDA) 10 MG tablet Take 1 tablet (10 mg total) by mouth 2 (two) times daily. Please call and make overdue appt for further refills. 2nd attempt (Patient taking differently: Take 5 mg by mouth 2 (two) times daily. Please call and make overdue appt for further refills. 2nd attempt) 30 tablet 0   metoprolol tartrate (LOPRESSOR) 25 MG tablet TAKE 1 TABLET BY MOUTH  DAILY (Patient taking  differently: Take 12.5 mg by mouth 2 (two) times daily.) 90 tablet 3   mirabegron ER (MYRBETRIQ) 50 MG TB24 tablet Take 50 mg by mouth at bedtime.      nitroGLYCERIN (NITROSTAT) 0.4 MG SL tablet Place 1 tablet (0.4 mg total) under the tongue every 5 (five) minutes as needed for up to 25 days for chest pain. 25 tablet 3   omega-3 acid ethyl esters (LOVAZA) 1 G capsule Take 2 g by mouth daily.     Rivaroxaban (XARELTO) 15 MG TABS tablet Take 1 tablet (15 mg total) by mouth daily with supper. 90 tablet 3   rOPINIRole (REQUIP) 2 MG tablet Take 3-4 mg by mouth daily as needed (RLS).     spironolactone (ALDACTONE) 25 MG tablet TAKE ONE-HALF TABLET BY  MOUTH  DAILY 45 tablet 3   tamsulosin (FLOMAX) 0.4 MG CAPS capsule Take 0.4 mg by mouth daily after supper.      torsemide (DEMADEX) 20 MG tablet Take 1 tablet (20 mg total) by mouth as directed. 2 times a week on Tuesday and Thursday.  Take additional dose as directed with weight gain of 2 Lbs 90 tablet 1   valsartan-hydrochlorothiazide (DIOVAN-HCT) 80-12.5 MG tablet TAKE 1 TABLET BY MOUTH IN  THE MORNING 90 tablet 3   vitamin B-12 (CYANOCOBALAMIN) 1000 MCG tablet Take 1,000 mcg by mouth 2 (two) times daily.     No current facility-administered medications for this visit.     Allergies: No Known Allergies      Physical Exam:    Blood pressure (!) 137/57, pulse 62, temperature 97.8 F (36.6 C), temperature source Temporal, resp. rate 17, height 5' 8"  (1.727 m), weight 204 lb 12.8 oz (92.9 kg), SpO2 100 %.    ECOG: 1    General appearance: Alert, awake without any distress. Head: Atraumatic without abnormalities Oropharynx: Without any thrush or ulcers. Eyes: No scleral icterus. Lymph nodes: No lymphadenopathy noted in the cervical, supraclavicular, or axillary nodes Heart:regular rate and rhythm, without any murmurs or gallops.   Lung: Clear to auscultation without any rhonchi, wheezes or dullness to percussion. Abdomin: Soft, nontender without any shifting dullness or ascites. Musculoskeletal: No clubbing or cyanosis. Neurological: No motor or sensory deficits. Skin: No rashes or lesions.        Lab Results: Lab Results  Component Value Date   WBC 6.3 10/20/2021   HGB 9.2 (L) 10/26/2021   HCT 27.0 (L) 10/26/2021   MCV 87 10/20/2021   PLT 183 10/20/2021   PSA 2.61 06/07/2006     Chemistry      Component Value Date/Time   NA 140 10/26/2021 1028   NA 138 10/20/2021 1607   K 3.5 10/26/2021 1028   CL 101 10/20/2021 1607   CO2 22 10/20/2021 1607   BUN 18 10/20/2021 1607   CREATININE 1.03 10/20/2021 1607      Component Value Date/Time   CALCIUM 9.3 10/20/2021  1607   ALKPHOS 51 09/03/2021 1454   AST 14 09/03/2021 1454   ALT 10 09/03/2021 1454   BILITOT 0.4 09/03/2021 1454   BILITOT 0.4 09/15/2020 1154          Impression and Plan:  81 year old man with:    1.  Iron deficiency anemia diagnosed in 2019.  He he has element of anemia of chronic disease and possible dysplasia.  Laboratory data in last 6 months were reviewed with hemoglobin of ranged between 9 and 11 and he is currently asymptomatic.  Further investigation such  as bone marrow biopsy will be deferred at this time given the mild nature of his anemia.  I recommended continued monitoring and replace iron as needed.    2.  Prostate cancer diagnosed in 2018.  He is status post radiation therapy which could be contributing factor to his bone marrow suppression.     3.  Follow-up: He will return in 6 months for follow-up.  30  minutes were spent on this visit.  The time was dedicated to reviewing laboratory data, disease status update and outlining future plan of care discussion.   Zola Button, MD 7/11/20239:21 AM

## 2022-02-15 ENCOUNTER — Telehealth: Payer: Self-pay | Admitting: *Deleted

## 2022-02-15 NOTE — Telephone Encounter (Signed)
Wife states Esvin is going to be seen by [facility] for assistance with medication and they need [place] fax labs to Dr Tildon Husky. [phone] Fax sent

## 2022-04-15 NOTE — Progress Notes (Signed)
[facility] Note  04/18/2022     CHIEF COMPLAINT Patient presents for Retina Follow Up  HISTORY OF PRESENT ILLNESS: Cody Phelps is a 81 y.o. male who presents to the clinic today for:  HPI     Retina Follow Up   Patient presents with  Other.  In both eyes.  Severity is moderate.  Duration of 3 months.  Since onset it is stable.  I, the attending physician,  performed the HPI with the patient and updated documentation appropriately.        Comments   3 month Retina eval for Cme ou. Patient states no change in vision noticed      Last edited by [name], MD on 04/18/2022  1:45 PM.    Pt states   Referring physician: [name], MD [place],  [place] [id number]  HISTORICAL INFORMATION:   Selected notes from the [place] Referred by Dr. [name] for concern of RD OD   CURRENT MEDICATIONS: Current Outpatient Medications (Ophthalmic Drugs)  Medication Sig   Bromfenac Sodium (PROLENSA) 0.07 % SOLN Place 1 drop into the right eye 2 (two) times daily.   dorzolamide-timolol (COSOPT) 22.3-6.8 MG/ML ophthalmic solution Place 1 drop into the right eye daily.   Loteprednol Etabonate (LOTEMAX SM) 0.38 % GEL Place 1 drop into the right eye 2 (two) times daily.   No current facility-administered medications for this visit. (Ophthalmic Drugs)   Current Outpatient Medications (Other)  Medication Sig   atorvastatin (LIPITOR) 80 MG tablet TAKE 1 TABLET BY MOUTH  DAILY (Patient taking differently: Take 40 mg by mouth at bedtime.)   colesevelam (WELCHOL) 625 MG tablet Take 625 mg by mouth 3 (three) times daily.    donepezil (ARICEPT) 10 MG tablet Take 10 mg by mouth every morning.    esomeprazole (NEXIUM) 40 MG capsule Take 40 mg by mouth daily.    ferrous sulfate 325 (65 FE) MG tablet TAKE 1 TABLET BY MOUTH EVERY DAY WITH BREAKFAST   gabapentin (NEURONTIN) 300 MG capsule Take 300 mg by mouth at bedtime.   GEMTESA 75 MG  TABS Take 1 tablet by mouth daily.   HYDROcodone bit-homatropine (HYCODAN) 5-1.5 MG/5ML syrup SMARTSIG:5 Milliliter(s) By Mouth Every 12 Hours PRN   insulin glargine (LANTUS) 100 UNIT/ML injection Inject 20-40 Units into the skin at bedtime as needed (BGL below 140-150 take 20 units, if 151 or higher take 40 units).   memantine (NAMENDA) 10 MG tablet Take 1 tablet (10 mg total) by mouth 2 (two) times daily. Please call and make overdue appt for further refills. 2nd attempt (Patient taking differently: Take 5 mg by mouth 2 (two) times daily. Please call and make overdue appt for further refills. 2nd attempt)   metoprolol tartrate (LOPRESSOR) 25 MG tablet TAKE 1 TABLET BY MOUTH  DAILY (Patient taking differently: Take 12.5 mg by mouth 2 (two) times daily.)   omega-3 acid ethyl esters (LOVAZA) 1 G capsule Take 2 g by mouth daily.   promethazine-dextromethorphan (PROMETHAZINE-DM) 6.25-15 MG/5ML syrup Take 5 mLs by mouth at bedtime as needed.   Rivaroxaban (XARELTO) 15 MG TABS tablet Take 1 tablet (15 mg total) by mouth daily with supper.   rOPINIRole (REQUIP) 2 MG tablet Take 3-4 mg by mouth daily as needed (RLS).   spironolactone (ALDACTONE) 25 MG tablet TAKE ONE-HALF TABLET BY  MOUTH DAILY   tamsulosin (FLOMAX) 0.4 MG CAPS capsule Take 0.4 mg by mouth daily after  supper.    valsartan-hydrochlorothiazide (DIOVAN-HCT) 80-12.5 MG tablet TAKE 1 TABLET BY MOUTH IN  THE MORNING   vitamin B-12 (CYANOCOBALAMIN) 1000 MCG tablet Take 1,000 mcg by mouth 2 (two) times daily.   nitroGLYCERIN (NITROSTAT) 0.4 MG SL tablet Place 1 tablet (0.4 mg total) under the tongue every 5 (five) minutes as needed for up to 25 days for chest pain.   torsemide (DEMADEX) 20 MG tablet Take 1 tablet (20 mg total) by mouth as directed. 2 times a week on Tuesday and Thursday.  Take additional dose as directed with weight gain of 2 Lbs   No current facility-administered medications for this visit. (Other)   REVIEW OF SYSTEMS: ROS    Positive for: Gastrointestinal, Musculoskeletal, Endocrine, Cardiovascular, Eyes, Respiratory Negative for: Constitutional, Neurological, Skin, Genitourinary, HENT, Psychiatric, Allergic/Imm, Heme/Lymph Last edited by Elmore Guise, COT on 04/18/2022 12:54 PM.     ALLERGIES No Known Allergies  PAST MEDICAL HISTORY Past Medical History:  Diagnosis Date   Acute anterior wall MI ([place]) 03/01/2018   Acute combined systolic and diastolic heart failure ([place]) 03/15/2018   Burn    Chronic diastolic CHF (congestive heart failure) ([place]) 09/21/2018   Defect, retina, with detachment    Dementia ([place])    Diverticulosis    ED (erectile dysfunction)    First degree heart block    GERD (gastroesophageal reflux disease)    Heart murmur    History of blood transfusion    with open heart surgery   Hypertension    cardiologsit-  dr [name]   Hypertensive retinopathy    OU   Iron deficiency anemia    Mixed hyperlipidemia    Morbid obesity ([place])    Myocardial infarction ([place])    02/28/18   OA (osteoarthritis)    right hip   OSA on CPAP    followed by dr dohmeier, uses CPAP   PAF (paroxysmal atrial fibrillation) ([place])    a. on Eliquis   Prostate cancer ([place])    dx 03-17-2017 (bx)  Stage T2a, Gleason 4+4, PSA  4.43, vol 32.32--  s/p radiatctive prostate seed implants 07-10-2017 then IMRT and ADT   Retinal detachment    Rheg. 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   LIMA to Diagonal Branch   S/P [place]aze operation for atrial fibrillation 05/30/2018   Complete bilateral atrial lesion set using bipolar radiofrequency and cryothermy ablation with clipping of LA appendage   Scoliosis    Trigger finger    Type 2 diabetes mellitus ([place])    Wears partial dentures    upper and lower   Past Surgical History:  Procedure Laterality Date    AORTIC VALVE REPLACEMENT N/A 05/30/2018   Procedure: AORTIC VALVE REPLACEMENT (AVR) using Inspiris Valve, Size 23;  Surgeon: [name], MD;  Location: [place];  Service: Open Heart Surgery;  Laterality: N/A;   APPENDECTOMY  1988   BILATERAL TOTAL ETHMOIDECTOMY AND SPHENOIDECTOMY  01-14-2009    dr Redmond Baseman   [facility]   CARDIOVERSION N/A 08/10/2018   Procedure: CARDIOVERSION;  Surgeon: Adrian Prows, MD;  Location: [place];  Service: Cardiovascular;  Laterality: N/A;   CARPAL TUNNEL RELEASE Bilateral 2013   CATARACT EXTRACTION Bilateral    Dr. [name]   CATARACT EXTRACTION W/ INTRAOCULAR LENS  IMPLANT, BILATERAL  date?   CORONARY  ARTERY BYPASS GRAFT N/A 05/30/2018   CORONARY BALLOON ANGIOPLASTY N/A 03/01/2018   Procedure: CORONARY BALLOON ANGIOPLASTY;  Surgeon: Adrian Prows, MD;  Location: [place] [facility];  Service: Cardiovascular;  Laterality: N/A;   CORONARY/GRAFT ACUTE MI REVASCULARIZATION N/A 03/01/2018   Procedure: Coronary/Graft Acute MI Revascularization;  Surgeon: Adrian Prows, MD;  Location: [place] [facility];  Service: Cardiovascular;  Laterality: N/A;   CYSTOSCOPY  07/19/2017   Procedure: CYSTOSCOPY;  Surgeon: Nickie Retort, MD;  Location: [facility];  Service: Urology;;  No seeds found in [place] Bilateral    Cat Sx OU.  Dislocated IOL sx OD.  Rheg. RD repair OS.   EYE SURGERY     GAS/FLUID EXCHANGE Right 06/13/2019   Procedure: GAS/FLUID EXCHANGE;  Surgeon: [name], MD;  Location: [place];  Service: Ophthalmology;  Laterality: Right;   LAMINECTOMY WITH POSTERIOR LATERAL ARTHRODESIS LEVEL 2 N/A 02/12/2014   Procedure: LUMBAR TWO-THREE,LUIMBAR THREE-FOUR LAMINECTOMY/FORAMINOTOMY;POSSIBLE POSTEROLATERAL ARTHRODESIS WITH AUTOGRAFT;  Surgeon: [name], MD;  [facility]: [facility];  Service: Neurosurgery;  Laterality: N/A;   LEFT HEART CATH AND CORONARY ANGIOGRAPHY N/A 03/01/2018   Procedure: LEFT HEART CATH AND CORONARY ANGIOGRAPHY;  Surgeon:  Adrian Prows, MD;  Location: [place] [facility];  Service: Cardiovascular;  Laterality: N/A;   MAZE N/A 05/30/2018   Procedure: MAZE;  Surgeon: [name], MD;  Location: [place];  Service: Open Heart Surgery;  Laterality: N/A;   ORIF RIGHT ANKLE FX'S  05/05/2001   retained hardware   PARS PLANA VITRECTOMY Right 06/13/2019   Procedure: PARS PLANA VITRECTOMY WITH 25 GAUGE WITH LASER AND GAS;  Surgeon: [name], MD;  Location: [place];  Service: Ophthalmology;  Laterality: Right;   PARS PLANA VITRECTOMY Right 09/12/2019   Procedure: Pars Plana Vitrectomy With 25 Gauge;  Surgeon: [name], MD;  Location: [place];  Service: Ophthalmology;  Laterality: Right;   PERFLUORONE INJECTION Right 06/13/2019   Procedure: PERFLUORONE INJECTION;  Surgeon: [name], MD;  Location: [place];  Service: Ophthalmology;  Laterality: Right;   PHOTOCOAGULATION WITH LASER Right 06/13/2019   Procedure: PHOTOCOAGULATION WITH LASER;  Surgeon: [name], MD;  Location: [place];  Service: Ophthalmology;  Laterality: Right;   PLACEMENT AND SUTURE OF SECONDARY INTRAOCULAR LENS Right 09/12/2019   Procedure: PLACEMENT AND SUTURE OF SECONDARY INTRAOCULAR LENS;  Surgeon: [name], MD;  Location: [place];  Service: Ophthalmology;  Laterality: Right;   PROSTATE BIOPSY  03-17-2017   dr Pilar Jarvis office   RADIOACTIVE SEED IMPLANT N/A 07/19/2017   Procedure: RADIOACTIVE SEED IMPLANT/BRACHYTHERAPY IMPLANT;  Surgeon: Nickie Retort, MD;  Location: [facility];  Service: Urology;  Laterality: N/A;  69 seeds implanted   REMOVAL RETAINED LENS Right 09/12/2019   Procedure: REMOVAL RETAINED LENS;  Surgeon: [name], MD;  Location: [place];  Service: Ophthalmology;  Laterality: Right;   RETINAL DETACHMENT SURGERY Right 06/13/2019   Dr. Coralyn Pear   RIGHT/LEFT HEART CATH AND CORONARY/GRAFT ANGIOGRAPHY N/A 10/26/2021   Procedure: RIGHT/LEFT HEART CATH AND CORONARY/GRAFT ANGIOGRAPHY;  Surgeon: Adrian Prows, MD;   Location: [place] [facility];  Service: Cardiovascular;  Laterality: N/A;   SKIN GRAFT     face   SPACE OAR INSTILLATION N/A 07/19/2017   Procedure: SPACE OAR INSTILLATION;  Surgeon: Nickie Retort, MD;  Location[facility];  Service: Urology;  Laterality: N/A;   TEE WITHOUT CARDIOVERSION  03/20/2012   Procedure: TRANSESOPHAGEAL ECHOCARDIOGRAM (TEE);  Surgeon: Cammy Brochure  Carlynn Herald, MD;  [facility];  Service: Cardiovascular;  Laterality: N/A;  normal LV; normal EF; normal RV; normal LA w/ left atrial appendage very small, normal function, interatrial septum intact without defect; normal RA; trace MR,TR, & PI; mild AV calcification and senile degeneration w/ mild stenosis, AVA 1.7cm^2;;    TEE WITHOUT CARDIOVERSION N/A 05/30/2018   Procedure: TRANSESOPHAGEAL ECHOCARDIOGRAM (TEE);  Surgeon: [name], MD;  Location: [place];  Service: Open Heart Surgery;  Laterality: N/A;   TOTAL HIP ARTHROPLASTY Right 10/23/2017   Procedure: TOTAL HIP ARTHROPLASTY ANTERIOR APPROACH;  Surgeon: Frederik Pear, MD;  Location: [place];  Service: Orthopedics;  Laterality: Right;   TRANSTHORACIC ECHOCARDIOGRAM  01-11-2017   dr [name]  (per echo note, no significant change in seveity of AS, no other diagnostic change)   moderate concentric LVH, ef 51%, grade 1 diastolic dysfunction/  moderate LAE/  mild , grade 1 AR w/ moderate AV calcification, mild to moderate restricted AV leaflets w/ moderate AS, AVA 1.16cm^2, peak grandient 72mHg, mean grandient 363mg/  trace MR, mild calcification MV annulus , mild MV leaflet calcification, mild MVS, peak grandient 4.85m37m, mean grandient 2.7mm1m trace TR   FAMILY HISTORY Family History  Problem Relation Age of Onset   Stroke Mother 80  38ypertension Mother    Hypertension Father    Heart disease Father    Heart attack Father 70  62eart murmur Father    Hypertension Sister    Lung disease Brother    Colon cancer Neg Hx    Stomach cancer Neg Hx     Rectal cancer Neg Hx    Pancreatic cancer Neg Hx    SOCIAL HISTORY Social History   Tobacco Use   Smoking status: Former    Packs/day: 0.25    Years: 1.00    Total pack years: 0.25    Types: Cigarettes    Quit date: 02/04/1967    Years since quitting: 55.2   Smokeless tobacco: Never   Tobacco comments:    smoked 1 q2-3 days  Vaping Use   Vaping Use: Never used  Substance Use Topics   Alcohol use: Not Currently    Comment: occasional beer   Drug use: Never       OPHTHALMIC EXAM:  Base Eye Exam     Visual Acuity (Snellen - Linear)       Right Left   Dist [place] 20/40-2 20/30-2   Dist ph [place] 20/25-1 20/20         Tonometry (Tonopen, 1:03 PM)       Right Left   Pressure 14 16         Pupils       Dark Light Shape React APD   Right 3 2 Round Brisk None   Left 3 2 Round Brisk None         Visual Fields (Counting fingers)       Left Right    Full Full         Extraocular Movement       Right Left    Full, Ortho Full, Ortho         Neuro/Psych     Oriented x3: Yes   Mood/Affect: Normal         Dilation     Both eyes: 1.0% Mydriacyl, 2.5% Phenylephrine @ 1:03 PM           Slit Lamp and Fundus Exam     Slit Lamp Exam  Right Left   Lids/Lashes mild Meibomian gland dysfunction, mild Telangiectasia Dermatochalasis - upper lid, Meibomian gland dysfunction, Edema, Erythema lower lid, positive concretion left palp conj   Conjunctiva/Sclera White and quiet, STK ST quad -- gone Temporal Pinguecula   Cornea well healed superior cataract wound, trace Punctate epithelial erosions, Arcus Trace Punctate epithelial erosions, trace fine endo pigment   Anterior Chamber Deep, 1+fine cell/pigment Deep and quiet   Iris Round and dilated to 43m, scattered Transillumination defects 360, +iridodenesis, +pigment deposition, +atrophy Round and dilated   Lens Sutured Akreos IOL well centered 3 piece Posterior chamber intraocular in good position, trace  Posterior capsular opacification   Anterior Vitreous post vitrectomy, trace fine pigment Vitreous syneresis, Posterior vitreous detachment         Fundus Exam       Right Left   Disc 1-2+Pallor, Sharp rim 2+Pallor, Sharp rim, mild PPA/PPP, mild tilt, Compact   C/D Ratio 0.3 0.5   Macula Flat, Blunted foveal reflex, mild RPE mottling and clumping, trace central cystic changes, +pigment deposition on retinal surface -- improved, rare MA, mild ERM Flat, Blunted foveal reflex, Retinal pigment epithelial mottling and clumping, No heme or edema   Vessels attenuated, Tortuous attenuated, Tortuous   Periphery attached, good 360 laser in place; ORIGINALLY: bullous superior retinal detachment from 1000-0130, another more shallow detachment lobe from 130-400, lattice and micro tears ST quadrant; Old retinal tear at 0900 with surrounding laser Attached, peripheral laser scars at 1030, No new RT/RD           IMAGING AND PROCEDURES  Imaging and Procedures for _0 @  OCT, Retina - OU - Both Eyes       Right Eye Quality was good. Central Foveal Thickness: 325. Progression has been stable. Findings include normal foveal contour, no IRF, no SRF (Persistent IRF/cystic changes nasal fovea, trace ERM).   Left Eye Quality was good. Central Foveal Thickness: 270. Progression has been stable. Findings include normal foveal contour, no IRF, no SRF.   Notes *Images captured and stored on drive  Diagnosis / Impression:  OD: Persistent IRF/cystic changes nasal fovea, trace ERM OS: NFP, no IRF/SRF   Clinical management:  See below  Abbreviations: NFP - Normal foveal profile. CME - cystoid macular edema. PED - pigment epithelial detachment. IRF - intraretinal fluid. SRF - subretinal fluid. EZ - ellipsoid zone. ERM - epiretinal membrane. ORA - outer retinal atrophy. ORT - outer retinal tubulation. SRHM - subretinal hyper-reflective material             ASSESSMENT/PLAN:    ICD-10-CM   1.  Dislocation of intraocular lens, sequela  T85.22XS OCT, Retina - OU - Both Eyes    2. Right retinal detachment  H33.21     3. 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 History of repair of retinal tear by laser photocoagulation  Z98.890     5. Diabetes mellitus type 2 without retinopathy [place]  E11.9     6. Essential hypertension  I10     7. Hypertensive retinopathy of both eyes  H35.033     8. Pseudophakia of both eyes  Z96.1     9. Cystoid macular edema of right eye  H35.351 OCT, Retina - OU - Both Eyes    10. 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 Dislocated IOL OD  - pt with history of partially dislocated 3-piece IOL -- spontaneously dislocated completely into vitreous cavity  on 1.23.21  - s/p 25g PPV w/ IOL explantation and secondary sutured IOL OD -- Akreos AO60 lens, 17.5D -- 02.11.2021             - IOL in good position  - 2 superior nylon sutures removed at slit lamp 03.12.21 -- last nylon suture removed at slit lamp, 04.30.21  - corneal edema resolved; PEE improved             - s/p STK OD #1 (07.09.21), #2 (01.26.22), #3 (08.30.22) for CME  - BCVA 20/25 - improved  - OCT shows persistent IRF/cystic changes nasal fovea, trace ERM (see #9 below)  - IOP 14  - Ats QID OU              - f/u 4 months --DFE/OCT   2,3. Rhegmatogenous retinal detachment, right eye  - bullous, superior, mac off detachment, onset of foveal involvement 11.11.20 by history  - Bi-lobed superior detachment, superior lobe spanning 1000-0130, nasal lobe 0130-0400  - lattice degeneration with microtears noted at 1030  - s/p PPV/PFC/EL/FAX/14% C3F8 OD, 11.12.20             - intraop: HST at 2 oclock was found; also detachment had progressed to subtotal detachment spanning 9 oclock to 6 oclock (going in clockwise direction); also severe zonular insufficiency with IOL very mobile throughout case -- did not dislocate completely during the case or immediately post op  - doing  well             - retina attached and in good position--good laser in place  4. History of retinal defects s/p laser retinopexy OU with Dr. Zigmund Daniel in 2013  - OD laser at 0900  - OS laser at 1030  - stable  5. Diabetes mellitus, type 2 without retinopathy  - A1c was 6.2 in August 2022  - The incidence, risk factors for progression, natural history and treatment options for diabetic retinopathy  were discussed with patient.    - The need for close monitoring of blood glucose, blood pressure, and serum lipids, avoiding cigarette or any type of tobacco, and the need for long term follow up was also discussed with patient.   6,7. Hypertensive retinopathy OU  - discussed importance of tight BP control  - monitor  8. Pseudophakia OU  - s/p CE/IOL OU (Dr. [name])  - 3 piece IOL OD was completely displaced into vitreous -- now with sutured Akreos IOL in good position OD -- see above  - OS 3-piece IOL in good position  - monitor  9. CME OD  - post op edema-- recurrent  - s/p STK OD #1 07.09.21, #2 (01.26.22), #3 (08.24.22)  - today, stable improvement in cystic changes nasal fovea   - cont Lotemax SM BID OD and Prolensa BID OD  - cont Cosopt daily OD  - f/u 3 months DFE, OCT  - fax Rx to [facility] in [place] [phone])  10. Ocular Hypertension OD  - IOP: 14  - continue cosopt daily    Ophthalmic Meds Ordered this visit:  Meds ordered this encounter  Medications   dorzolamide-timolol (COSOPT) 22.3-6.8 MG/ML ophthalmic solution    Sig: Place 1 drop into the right eye daily.    Dispense:  10 mL    Refill:  6   Loteprednol Etabonate (LOTEMAX SM) 0.38 % GEL    Sig: Place 1 drop into the right eye 2 (two) times daily.    Dispense:  5 g  Refill:  6   Bromfenac Sodium (PROLENSA) 0.07 % SOLN    Sig: Place 1 drop into the right eye 2 (two) times daily.    Dispense:  6 mL    Refill:  6     Return in about 4 months (around 08/18/2022) for f/u CME OD, DFE, OCT.  There are no  Patient Instructions on file for this visit.  This document serves as a record of services personally performed by Gardiner Sleeper, MD, PhD. It was created on their behalf by Roselee Nova, COMT. The creation of this record is the provider's dictation and/or activities during the visit.  Electronically signed by: Roselee Nova, COMT 04/18/22 1:47 PM  This document serves as a record of services personally performed by Gardiner Sleeper, MD, PhD. It was created on their behalf by San Jetty. Owens Shark, OA an ophthalmic technician. The creation of this record is the provider's dictation and/or activities during the visit.    Electronically signed by: San Jetty. Owens Shark, [place] 09.18.2023 1:47 PM  Gardiner Sleeper, M.D., Ph.D. Diseases & Surgery of the Retina and Vitreous [facility]  I have reviewed the above documentation for accuracy and completeness, and I agree with the above. Gardiner Sleeper, M.D., Ph.D. 04/18/22 1:47 PM  Abbreviations: M myopia (nearsighted); A astigmatism; H hyperopia (farsighted); P presbyopia; Mrx spectacle prescription;  CTL contact lenses; OD right eye; OS left eye; OU both eyes  XT exotropia; ET esotropia; PEK punctate epithelial keratitis; PEE punctate epithelial erosions; DES dry eye syndrome; MGD meibomian gland dysfunction; ATs artificial tears; PFAT's preservative free artificial tears; [place] nuclear sclerotic cataract; PSC posterior subcapsular cataract; ERM epi-retinal membrane; PVD posterior vitreous detachment; RD retinal detachment; DM diabetes mellitus; DR diabetic retinopathy; NPDR non-proliferative diabetic retinopathy; PDR proliferative diabetic retinopathy; CSME clinically significant macular edema; DME diabetic macular edema; dbh dot blot hemorrhages; CWS cotton wool spot; POAG primary open angle glaucoma; C/D cup-to-disc ratio; HVF humphrey visual field; GVF goldmann visual field; OCT optical coherence tomography; IOP intraocular pressure; BRVO Branch  retinal vein occlusion; CRVO central retinal vein occlusion; CRAO central retinal artery occlusion; BRAO branch retinal artery occlusion; RT retinal tear; SB scleral buckle; PPV pars plana vitrectomy; VH Vitreous hemorrhage; PRP panretinal laser photocoagulation; IVK intravitreal kenalog; VMT vitreomacular traction; MH Macular hole;  NVD neovascularization of the disc; NVE neovascularization elsewhere; AREDS age related eye disease study; ARMD age related macular degeneration; POAG primary open angle glaucoma; EBMD epithelial/anterior basement membrane dystrophy; ACIOL anterior chamber intraocular lens; IOL intraocular lens; PCIOL posterior chamber intraocular lens; Phaco/IOL phacoemulsification with intraocular lens placement; [place] photorefractive keratectomy; LASIK laser assisted in situ keratomileusis; HTN hypertension; DM diabetes mellitus; COPD chronic obstructive pulmonary disease

## 2022-04-18 ENCOUNTER — Encounter (INDEPENDENT_AMBULATORY_CARE_PROVIDER_SITE_OTHER): Payer: Self-pay | Admitting: Ophthalmology

## 2022-04-18 ENCOUNTER — Ambulatory Visit (INDEPENDENT_AMBULATORY_CARE_PROVIDER_SITE_OTHER): Payer: Medicare Other | Admitting: Ophthalmology

## 2022-04-18 DIAGNOSIS — I10 Essential (primary) hypertension: Secondary | ICD-10-CM

## 2022-04-18 DIAGNOSIS — H3321 Serous retinal detachment, right eye: Secondary | ICD-10-CM | POA: Diagnosis not present

## 2022-04-18 DIAGNOSIS — H35033 Hypertensive retinopathy, bilateral: Secondary | ICD-10-CM | POA: Diagnosis not present

## 2022-04-18 DIAGNOSIS — Z961 Presence of intraocular lens: Secondary | ICD-10-CM

## 2022-04-18 DIAGNOSIS — Z9889 Other specified postprocedural states: Secondary | ICD-10-CM

## 2022-04-18 DIAGNOSIS — H35411 Lattice degeneration of retina, right eye: Secondary | ICD-10-CM | POA: Diagnosis not present

## 2022-04-18 DIAGNOSIS — H35351 Cystoid macular degeneration, right eye: Secondary | ICD-10-CM | POA: Diagnosis not present

## 2022-04-18 DIAGNOSIS — H40051 Ocular hypertension, right eye: Secondary | ICD-10-CM

## 2022-04-18 DIAGNOSIS — T8522XS Displacement of intraocular lens, sequela: Secondary | ICD-10-CM

## 2022-04-18 DIAGNOSIS — E119 Type 2 diabetes mellitus without complications: Secondary | ICD-10-CM

## 2022-04-18 MED ORDER — PROLENSA 0.07 % OP SOLN: 1.0000 [drp] | Freq: Two times a day (BID) | OPHTHALMIC | 6 refills | Status: DC

## 2022-04-18 MED ORDER — DORZOLAMIDE HCL-TIMOLOL MAL 2-0.5 % OP SOLN: 1.0000 [drp] | Freq: Every day | OPHTHALMIC | 6 refills | Status: DC

## 2022-04-18 MED ORDER — LOTEMAX SM 0.38 % OP GEL: 1.0000 [drp] | Freq: Two times a day (BID) | OPHTHALMIC | 6 refills | Status: DC

## 2022-05-02 ENCOUNTER — Other Ambulatory Visit: Payer: Self-pay | Admitting: Internal Medicine

## 2022-05-18 ENCOUNTER — Encounter: Payer: Self-pay | Admitting: Cardiology

## 2022-05-18 ENCOUNTER — Ambulatory Visit: Payer: Medicare Other | Admitting: Cardiology

## 2022-05-18 VITALS — BP 136/61 | HR 63 | Temp 97.9°F | Resp 16 | Ht 68.0 in | Wt 208.0 lb

## 2022-05-18 DIAGNOSIS — I484 Atypical atrial flutter: Secondary | ICD-10-CM

## 2022-05-18 DIAGNOSIS — Z953 Presence of xenogenic heart valve: Secondary | ICD-10-CM

## 2022-05-18 DIAGNOSIS — I25118 Atherosclerotic heart disease of native coronary artery with other forms of angina pectoris: Secondary | ICD-10-CM

## 2022-05-18 DIAGNOSIS — I10 Essential (primary) hypertension: Secondary | ICD-10-CM

## 2022-05-18 MED ORDER — APIXABAN 5 MG PO TABS: 5.0000 mg | ORAL_TABLET | Freq: Two times a day (BID) | ORAL | 3 refills | Status: DC

## 2022-05-18 MED ORDER — APIXABAN 5 MG PO TABS: 5.0000 mg | ORAL_TABLET | Freq: Two times a day (BID) | ORAL | 0 refills | Status: DC

## 2022-05-18 NOTE — Progress Notes (Signed)
Primary Physician/Referring:  [name], MD  Patient ID: Cody Phelps, male    DOB: May 15, 1941, 81 y.o.   MRN: [id number]  Chief Complaint  Patient presents with   Atrial Fibrillation   Coronary Artery Disease   Hypertension   Follow-up    6 months    HPI:    HPI: Cody Phelps  is a 81 y.o. 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  Due to an episode of syncope on 09/03/2021 seen in the ED, outpatient extended EKG monitoring it revealed NSVT, hence underwent cardiac catheterization revealing no significant progression of the disease on 10/18/2021.  Patient presents here for 1-monthoffice visit.  Patient states that his dyspnea has remained stable.  He has not had any dizziness or near syncope.  He is accompanied by his wife.  Denies chest pain or palpitations.   Social History   Tobacco Use   Smoking status: Former    Packs/day: 0.25    Years: 1.00    Total pack years: 0.25    Types: Cigarettes    Quit date: 02/04/1967    Years since quitting: 55.3   Smokeless tobacco: Never   Tobacco comments:    smoked 1 q2-3 days  Substance Use Topics   Alcohol use: Not Currently    Comment: occasional beer   Marital Status: Married   ROS  Review of Systems  Cardiovascular:  Positive for dyspnea on exertion. Negative for chest pain, leg swelling, near-syncope, orthopnea, paroxysmal nocturnal dyspnea and syncope.  Gastrointestinal:  Negative for melena.    Objective   Blood pressure 136/61, pulse 63, temperature 97.9 F (36.6 C), temperature source Temporal, resp. rate 16, height 5' 8" (1.727 m), weight 208 lb (94.3 kg), SpO2 98 %. Body mass index is 31.63 kg/m.      05/18/2022    1:20 PM 02/08/2022   10:07 AM 01/06/2022   10:45 AM  Vitals with BMI  Height 5' 8" 5' 8"   Weight 208 lbs 204 lbs 13 oz   BMI 307.86375.44  Systolic [id number] Diastolic 61 57 64  Pulse 63 62 62  Orthostatic VS for the past 72 hrs (Last 3 readings):  Patient Position BP Location Cuff Size  05/18/22 1320 Sitting Left Arm Large   Physical Exam Vitals reviewed.  Constitutional:      Appearance: He is well-developed. He is obese.  Neck:     Vascular: No JVD.  Cardiovascular:     Rate and Rhythm: Normal rate and regular rhythm.     Pulses: Intact distal pulses.          Carotid pulses are 2+ on the right side with bruit and 2+ on the left side with bruit.      Popliteal pulses are 2+ on the right side and 2+ on the left side.       Dorsalis pedis pulses are 2+ on the right side and 1+ on the left side.       Posterior tibial pulses are 0 on the right side  and 0 on the left side.     Heart sounds: Murmur heard.     Early systolic murmur is present with a grade of 2/6 at the upper right sternal border radiating to the apex.     No gallop.  Pulmonary:     Effort: Pulmonary effort is normal.     Breath sounds: Normal breath sounds.  Abdominal:     General: Bowel sounds are normal.     Palpations: Abdomen is soft.  Musculoskeletal:     Right lower leg: Edema (trace) present.     Left lower leg: Edema (trace) present.    Laboratory examination:   Lab Results  Component Value Date   NA 140 10/26/2021   K 3.5 10/26/2021   CO2 22 10/20/2021   GLUCOSE 110 (H) 10/20/2021   BUN 18 10/20/2021   CREATININE 1.03 10/20/2021   CALCIUM 9.3 10/20/2021   EGFR 74 10/20/2021   GFRNONAA >60 09/03/2021       Latest Ref Rng & Units 10/26/2021   10:28 AM 10/26/2021    10:25 AM 10/26/2021   10:18 AM  CMP  Sodium 135 - 145 mmol/L 140  141  141   Potassium 3.5 - 5.1 mmol/L 3.5  3.6  3.6       Latest Ref Rng & Units 02/08/2022    9:38 AM 10/26/2021   10:28 AM 10/26/2021   10:25 AM  CBC  WBC 4.0 - 10.5 K/uL 5.6     Hemoglobin 13.0 - 17.0 g/dL 9.2  9.2  9.9   Hematocrit 39.0 - 52.0 % 27.7  27.0  29.0   Platelets 150 - 400 K/uL 203       ProBNP (last 3 results) Recent Labs    09/03/21 1454  PROBNP 86.0   External labs: Cholesterol, total 141.000 m 03/23/2021 HDL 43.000 mg 03/23/2021 LDL 65.000 mg 03/23/2021 Triglycerides 166.000 m 03/23/2021  A1C 6.300 04/13/2022  Allergies  No Known Allergies    Final Medications at End of Visit    Current Outpatient Medications:    apixaban (ELIQUIS) 5 MG TABS tablet, Take 1 tablet (5 mg total) by mouth 2 (two) times daily., Disp: 180 tablet, Rfl: 3   apixaban (ELIQUIS) 5 MG TABS tablet, Take 1 tablet (5 mg total) by mouth 2 (two) times daily., Disp: 60 tablet, Rfl: 0   Bromfenac Sodium (PROLENSA) 0.07 % SOLN, Place 1 drop into the right eye 2 (two) times daily., Disp: 6 mL, Rfl: 6   colesevelam (WELCHOL) 625 MG tablet, Take 625 mg by mouth 3 (three) times daily. , Disp: , Rfl:    donepezil (ARICEPT) 10 MG tablet, Take 10 mg by mouth every morning. , Disp: , Rfl:    dorzolamide-timolol (COSOPT) 22.3-6.8 MG/ML ophthalmic solution, Place 1 drop into the right eye daily., Disp: 10 mL, Rfl: 6   esomeprazole (NEXIUM) 40 MG capsule, Take 40 mg by mouth daily. , Disp: , Rfl:    ferrous sulfate 325 (65 FE) MG tablet, TAKE 1 TABLET BY MOUTH EVERY DAY WITH BREAKFAST, Disp: 90 tablet, Rfl: 3   gabapentin (NEURONTIN) 300 MG capsule, Take 300 mg by mouth at bedtime., Disp: , Rfl:    GEMTESA 75 MG TABS, Take 1 tablet by mouth daily., Disp: , Rfl:    insulin glargine (LANTUS) 100 UNIT/ML injection, Inject 20-40 Units into the skin at bedtime as needed (BGL below 140-150 take 20 units, if 151 or higher take 40 units)., Disp: ,  Rfl:  Loteprednol Etabonate (LOTEMAX SM) 0.38 % GEL, Place 1 drop into the right eye 2 (two) times daily., Disp: 5 g, Rfl: 6   memantine (NAMENDA) 10 MG tablet, Take 1 tablet (10 mg total) by mouth 2 (two) times daily. Please call and make overdue appt for further refills. 2nd attempt (Patient taking differently: Take 5 mg by mouth 2 (two) times daily. Please call and make overdue appt for further refills. 2nd attempt), Disp: 30 tablet, Rfl: 0   metoprolol tartrate (LOPRESSOR) 25 MG tablet, TAKE 1 TABLET BY MOUTH  DAILY (Patient taking differently: Take 12.5 mg by mouth 2 (two) times daily.), Disp: 90 tablet, Rfl: 3   nitroGLYCERIN (NITROSTAT) 0.4 MG SL tablet, Place 1 tablet (0.4 mg total) under the tongue every 5 (five) minutes as needed for up to 25 days for chest pain., Disp: 25 tablet, Rfl: 3   omega-3 acid ethyl esters (LOVAZA) 1 G capsule, Take 2 g by mouth daily., Disp: , Rfl:    rOPINIRole (REQUIP) 2 MG tablet, Take 3-4 mg by mouth daily as needed (RLS)., Disp: , Rfl:    spironolactone (ALDACTONE) 25 MG tablet, TAKE ONE-HALF TABLET BY  MOUTH DAILY, Disp: 45 tablet, Rfl: 3   tamsulosin (FLOMAX) 0.4 MG CAPS capsule, Take 0.4 mg by mouth daily after supper. , Disp: , Rfl:    torsemide (DEMADEX) 20 MG tablet, Take 1 tablet (20 mg total) by mouth as directed. 2 times a week on Tuesday and Thursday.  Take additional dose as directed with weight gain of 2 Lbs, Disp: 90 tablet, Rfl: 1   valsartan-hydrochlorothiazide (DIOVAN-HCT) 80-12.5 MG tablet, TAKE 1 TABLET BY MOUTH IN  THE MORNING, Disp: 90 tablet, Rfl: 3   vitamin B-12 (CYANOCOBALAMIN) 1000 MCG tablet, Take 1,000 mcg by mouth 2 (two) times daily., Disp: , Rfl:    atorvastatin (LIPITOR) 80 MG tablet, TAKE 1 TABLET BY MOUTH  DAILY (Patient taking differently: Take 40 mg by mouth at bedtime.), Disp: 90 tablet, Rfl: 3   Radiology  Chest x-ray 09/03/2021: Prior median sternotomy. Heart is normal size. No confluent airspace opacities or effusions.  No acute bony abnormality.  IMPRESSION: No active disease.   CTA PE 09/03/2021: 1. No evidence of significant pulmonary embolus. 2. No evidence of active pulmonary disease. 3. Postoperative changes. 4. Aortic atherosclerosis. 5. Small esophageal hiatal hernia.  CT Chest 06/12/2020: 1. There is a rounded ground-glass opacity of the central left lower lobe measuring 2.8 x 2.5 cm with a solid central nodule measuring 4 mm. This is concerning for adenocarcinoma. Comparison to prior examination dated 03/17/2018 is not possible due to the presence of pleural effusions and atelectasis at that time. Given small size, PET-CT is likely of limited utility to assess for metabolic activity. Recommend initial follow-up examination at 3-6 months to assess for persistence. If unchanged, and solid component remains <6 mm, annual CT is recommended until 5 years of stability has been established. If persistent these nodules should be considered highly suspicious if the solid component of the nodule is 6 mm or greater in size and enlarging. This recommendation follows the consensus statement: Guidelines for Management of Incidental Pulmonary Nodules Detected on CT Images  2. Scattered ground-glass opacity of the dependent left lung base, generally nonspecific and likely scarring and/or partial atelectasis. 3. Coronary artery disease. 4. Status post aortic valve replacement. 5. Aortic Atherosclerosis (ICD10-I70.0).  Cardiac Studies:  Echocardiogram 10/05/2021:  Normal LV systolic function with EF 59%. Left ventricle cavity is normal  in size.  Normal global wall motion. Indeterminate LAP due to severe mitral  annular calcification.  Calculated EF 59%.  Left atrial cavity is severely dilated at 5 cm.  Bioprosthetic aortic valve. No evidence of aortic stenosis. No aortic  valve regurgitation.  Moderate calcification of the mitral valve annulus. Mild mitral stenosis.  Mild (Grade I) mitral regurgitation. Mild  mitral valve leaflet thickening  with moderate calcification. Mildly restricted mitral valve leaflets.  Mitral valve peak pressure gradient 12.6, mean pressure gradient 4.4 mmHg,  calculated mitral valve area by pressure half-time is 1.9 cm.  No significant change since 03/15/2021. Mitral stenosis new.   Lexiscan Nuclear stress test 10/05/2021: Non-diagnostic ECG stress due to pharmacologic stress.  Myocardial perfusion is abnormal. There is a fixed defect with no uptake in the apical region. There is small to moderate sized reversible ischemia in the inferior region.  Overall LV systolic function is normal with inferior wall hypokinesis. Stress LV EF: 78%.  No previous exam available for comparison. Intermediate risk study.   Ambulatory cardiac telemetry 14 days (09/07/2021 - 09/21/2021): Patient has known permanent atrial flutter with heart rate ranging from 31 to 96 bpm, average heart rate 63 bpm.  Patient did have 7 ventricular pauses 3.0-3.2 secs during sleep hours.  Rare PVCs.  No patient triggered events.  No evidence of ventricular tachycardia, high degree AV block.   Carotid artery duplex 05/20/2018: Right Carotid: Velocities in the right ICA are consistent with a 1-39% stenosis.  Left Carotid: Velocities in the left ICA are consistent with a 1-39% Stenosis.  Right and left heart catheterization 10/26/2021:   Procedural data:  RA pressure 10/9 mean 7 mm mercury. RA saturation 73%.  RV pressure 52/3 and Right ventricular EDP 7 mm Hg. PA pressure 62/17 with a mean of 32 mm mercury. PA saturation 68%.  Pulmonary capillary wedge 23/40 with a mean of 25 mm Hg. Aortic saturation 97%.  Qp:Qs: 0.83. Cardiac output was 7.76 with cardiac index of 3.7 by Fick.  Pulmonary vascular resistance 0.93  LV: 160/15 LVEDP 16. LVEF 55 Ao: 151/43 mean Ao pressure 89. No PG across the AV.    Dominance: Left LM: Large vessel, mild disease, mild calcification. LAD: Large vessel, moderate-sized D1  with ostial 60% stenosis.  LIMA to D1 is widely patent with competitive filling. LCx: Large and dominant.  Very mild disease. RI: N/A RCA: Small and nondominant, diffusely diseased.  EKG   EKG 05/18/2022: Atypical atrial flutter with 3: 1 conduction, ventricular rate 63 bpm.  Left axis deviation, left anterior fascicular block.  Poor R wave progression, cannot exclude anterolateral infarct old.  IVCD, borderline criteria for LVH.  Nonspecific T abnormality.  Compared to 11/15/2021, no significant change.  Assessment     ICD-10-CM   1. Coronary artery disease of native artery of native heart with stable angina pectoris ([name])  I25.118 EKG 12-Lead    2. Atypical atrial flutter ([name])  I48.4 apixaban (ELIQUIS) 5 MG TABS tablet    3. Essential hypertension  I10     4. S/P aortic valve replacement with bioprosthetic valve  Z95.3       CHA2DS2-VASc Score is 5.  Yearly risk of stroke: 7.2% (A, HTN, DM, Vasc Dz).  Score of 1=0.6; 2=2.2; 3=3.2; 4=4.8; 5=7.2; 6=9.8; 7=>9.8) -(CHF; HTN; vasc disease DM,  Male = 1; Age <65 =0; 65-74 = 1,  >75 =2; stroke/embolism= 2).    Recommendations:   Danyl G Kilker  is a 81 y.o. 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  Due to an episode of syncope on 09/03/2021 seen in the ED, outpatient extended EKG monitoring it revealed NSVT, hence underwent cardiac catheterization revealing no significant progression of the disease on 10/18/2021.  Patient presents  here for 92-monthoffice visit.  He had developed a small pseudoaneurysm of the right radial artery which is now completely resolved and thrombosed.  He has not had any clinical evidence of heart failure, no leg edema, no PND orthopnea.  Overall remained stable from cardiac standpoint.  Again discussed weight loss with the patient.  With regard to atrial fibrillation/atypical atrial flutter, he was on Xarelto however his insurance does not cover this, switched him to Eliquis 5 mg twice daily.  30-day prescription sent to local pharmacy and 90-day to mail order pharmacy.  Coupon for 30 days given.  He has chronic anemia, advised him that in case he ends up having significant anemia and he has had guaiac positive stool in the past however GI work-up has been negative, we could certainly consider Watchman device.  Both he and his wife do not contemplate any invasive procedures for now.  Blood pressure is well controlled, lipids are at goal, no changes in the medications were done except for changing to Eliquis.  Prosthetic aortic valve appears to be functioning well with no change in murmur.  Patient also has chronic diastolic heart failure.  Weight loss, salt restriction again discussed with the patient.  I will see him back in 6 months or sooner for follow-up.       JAdrian Prows MD, FD[facility] Center10/18/2023, 10:29 PM Office: [phone]: [phone]: [phone]

## 2022-05-18 NOTE — Patient Instructions (Signed)
You are having mucousy stools, reasons could be self-limiting.  If it does not self-limited, you may need to stop each 1 of these medications for a week to see which one is causing the issue.  Welchol/Colesivelom Namenda Metoprolol tartrate Lovaza Valsartan hydrochlorothiazide

## 2022-06-28 ENCOUNTER — Encounter: Payer: Self-pay | Admitting: Cardiology

## 2022-07-08 ENCOUNTER — Encounter: Payer: Self-pay | Admitting: Gastroenterology

## 2022-08-16 ENCOUNTER — Encounter: Payer: Self-pay | Admitting: Gastroenterology

## 2022-08-16 ENCOUNTER — Ambulatory Visit (INDEPENDENT_AMBULATORY_CARE_PROVIDER_SITE_OTHER): Payer: Medicare Other | Admitting: Gastroenterology

## 2022-08-16 VITALS — BP 132/60 | HR 63 | Wt 203.0 lb

## 2022-08-16 DIAGNOSIS — D500 Iron deficiency anemia secondary to blood loss (chronic): Secondary | ICD-10-CM | POA: Diagnosis not present

## 2022-08-16 DIAGNOSIS — R195 Other fecal abnormalities: Secondary | ICD-10-CM | POA: Diagnosis not present

## 2022-08-16 NOTE — Patient Instructions (Signed)
_______________________________________________________  If your blood pressure at your visit was 140/90 or greater, please contact your primary care physician to follow up on this.  _______________________________________________________  If you are age 80 or older, your body mass index should be between 23-30. Your Body mass index is 30.87 kg/m. If this is out of the aforementioned range listed, please consider follow up with your Primary Care Provider.  If you are age 80 or younger, your body mass index should be between 19-25. Your Body mass index is 30.87 kg/m. If this is out of the aformentioned range listed, please consider follow up with your Primary Care Provider.   ________________________________________________________  The [facility] providers would like to encourage you to use [facility] to communicate with providers for non-urgent requests or questions.  Due to long hold times on the telephone, sending your provider a message by [facility] may be a faster and more efficient way to get a response.  Please allow 48 business hours for a response.  Please remember that this is for non-urgent requests.  _______________________________________________________  It was a pleasure to see you today!  Thank you for trusting me with your gastrointestinal care!

## 2022-08-16 NOTE — Progress Notes (Addendum)
[facility] Consult Note:  History: Cody Phelps 08/16/2022  Referring provider: [name], MD  Reason for consult/chief complaint: Rectal Bleeding (Heme-positive stool )   Subjective  HPI: Cody Phelps is known to me for longstanding iron deficiency anemia that is multifactorial from CKD and obscure occult chronic GI blood loss.  Previous endoscopic workup, most recently September 2021 with reports on file.  He follows with Dr. Alen Phelps for the IDA and history of prostate cancer.  Office note from most recent visit with that clinic in July 2023 was reviewed, and he has an appointment for both labs and office visit with that clinic tomorrow.  Cody Phelps is here with his wife today, and they tell Cody Phelps he was referred by [facility] provider because of a heme positive stool checked.  His iron deficiency anemia. For the last few months he has had intermittent episodes of urgency for bowel movement followed by passage of some mucus, which time stool might be formed or loose and then followed by formed stool.  No rectal bleeding.  No change in medicines. Denies abdominal pain dysphagia vomiting or weight  ROS:  Review of Systems  Constitutional:  Positive for fatigue. Negative for appetite change and unexpected weight change.  HENT:  Negative for mouth sores and voice change.   Eyes:  Negative for pain and redness.  Respiratory:  Positive for shortness of breath. Negative for cough.   Cardiovascular:  Negative for chest pain and palpitations.  Genitourinary:  Negative for dysuria and hematuria.  Musculoskeletal:  Positive for arthralgias. Negative for myalgias.  Skin:  Negative for pallor and rash.  Neurological:  Negative for weakness and headaches.  Hematological:  Negative for adenopathy.     Past Medical History: Past Medical History:  Diagnosis Date   Acute anterior wall MI ([place]) 03/01/2018   Acute combined systolic and diastolic heart failure ([place]) 03/15/2018   Burn    Chronic  diastolic CHF (congestive heart failure) ([place]) 09/21/2018   Defect, retina, with detachment    Dementia ([place])    Diverticulosis    ED (erectile dysfunction)    First degree heart block    GERD (gastroesophageal reflux disease)    Heart murmur    History of blood transfusion    with open heart surgery   Hypertension    cardiologsit-  dr [name]   Hypertensive retinopathy    OU   Iron deficiency anemia    Mixed hyperlipidemia    Morbid obesity ([place])    Myocardial infarction ([place])    02/28/18   OA (osteoarthritis)    right hip   OSA on CPAP    followed by dr dohmeier, uses CPAP   PAF (paroxysmal atrial fibrillation) ([place])    a. on Eliquis   Prostate cancer ([place])    dx 03-17-2017 (bx)  Stage T2a, Gleason 4+4, PSA  4.43, vol 32.32--  s/p radiatctive prostate seed implants 07-10-2017 then IMRT and ADT   Retinal detachment    Rheg. 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   LIMA to Diagonal Branch   S/P Maze operation for atrial fibrillation 05/30/2018   Complete bilateral atrial lesion set using bipolar radiofrequency and cryothermy ablation with clipping of LA appendage   Scoliosis    Trigger finger    Type 2 diabetes mellitus ([place])  Wears partial dentures    upper and lower     Past Surgical History: Past Surgical History:  Procedure Laterality Date   AORTIC VALVE REPLACEMENT N/A 05/30/2018   Procedure: AORTIC VALVE REPLACEMENT (AVR) using Inspiris Valve, Size 23;  Surgeon: [name], MD;  Location: [place];  Service: Open Heart Surgery;  Laterality: N/A;   APPENDECTOMY  1988   BILATERAL TOTAL ETHMOIDECTOMY AND SPHENOIDECTOMY  01-14-2009    dr Cody Phelps   [facility]   CARDIOVERSION N/A 08/10/2018   Procedure: CARDIOVERSION;  Surgeon: Adrian Prows, MD;  Location: [place];  Service: Cardiovascular;  Laterality: N/A;    CARPAL TUNNEL RELEASE Bilateral 2013   CATARACT EXTRACTION Bilateral    Dr. Herbert Phelps   CATARACT EXTRACTION W/ INTRAOCULAR LENS  IMPLANT, BILATERAL  date?   CORONARY ARTERY BYPASS GRAFT N/A 05/30/2018   CORONARY BALLOON ANGIOPLASTY N/A 03/01/2018   Procedure: CORONARY BALLOON ANGIOPLASTY;  Surgeon: Adrian Prows, MD;  Location: [place] [facility];  Service: Cardiovascular;  Laterality: N/A;   CORONARY/GRAFT ACUTE MI REVASCULARIZATION N/A 03/01/2018   Procedure: Coronary/Graft Acute MI Revascularization;  Surgeon: Adrian Prows, MD;  Location: [place] [facility];  Service: Cardiovascular;  Laterality: N/A;   CYSTOSCOPY  07/19/2017   Procedure: CYSTOSCOPY;  Surgeon: Nickie Retort, MD;  Location: [facility];  Service: Urology;;  No seeds found in [place] Bilateral    Cat Sx OU.  Dislocated IOL sx OD.  Rheg. RD repair OS.   EYE SURGERY     GAS/FLUID EXCHANGE Right 06/13/2019   Procedure: GAS/FLUID EXCHANGE;  Surgeon: [name], MD;  Location: [place];  Service: Ophthalmology;  Laterality: Right;   LAMINECTOMY WITH POSTERIOR LATERAL ARTHRODESIS LEVEL 2 N/A 02/12/2014   Procedure: LUMBAR TWO-THREE,LUIMBAR THREE-FOUR LAMINECTOMY/FORAMINOTOMY;POSSIBLE POSTEROLATERAL ARTHRODESIS WITH AUTOGRAFT;  Surgeon: [name], MD;  Location: [facility];  Service: Neurosurgery;  Laterality: N/A;   LEFT HEART CATH AND CORONARY ANGIOGRAPHY N/A 03/01/2018   Procedure: LEFT HEART CATH AND CORONARY ANGIOGRAPHY;  Surgeon: Adrian Prows, MD;  Location: [place] [facility];  Service: Cardiovascular;  Laterality: N/A;   MAZE N/A 05/30/2018   Procedure: MAZE;  Surgeon: [name], MD;  Location: [place];  Service: Open Heart Surgery;  Laterality: N/A;   ORIF RIGHT ANKLE FX'S  05/05/2001   retained hardware   PARS PLANA VITRECTOMY Right 06/13/2019   Procedure: PARS PLANA VITRECTOMY WITH 25 GAUGE WITH LASER AND GAS;  Surgeon: [name], MD;  Location: [place];  Service: Ophthalmology;   Laterality: Right;   PARS PLANA VITRECTOMY Right 09/12/2019   Procedure: Pars Plana Vitrectomy With 25 Gauge;  Surgeon: [name], MD;  Location: [place];  Service: Ophthalmology;  Laterality: Right;   PERFLUORONE INJECTION Right 06/13/2019   Procedure: PERFLUORONE INJECTION;  Surgeon: [name], MD;  Location: [place];  Service: Ophthalmology;  Laterality: Right;   PHOTOCOAGULATION WITH LASER Right 06/13/2019   Procedure: PHOTOCOAGULATION WITH LASER;  Surgeon: [name], MD;  Location: [place];  Service: Ophthalmology;  Laterality: Right;   PLACEMENT AND SUTURE OF SECONDARY INTRAOCULAR LENS Right 09/12/2019   Procedure: PLACEMENT AND SUTURE OF SECONDARY INTRAOCULAR LENS;  Surgeon: [name], MD;  Location: [place];  Service: Ophthalmology;  Laterality: Right;   PROSTATE BIOPSY  03-17-2017   dr Pilar Jarvis office   RADIOACTIVE SEED IMPLANT N/A 07/19/2017   Procedure: RADIOACTIVE SEED IMPLANT/BRACHYTHERAPY IMPLANT;  Surgeon: Nickie Retort, MD;  Location[facility];  Service: Urology;  Laterality: N/A;  69 seeds implanted   REMOVAL RETAINED LENS Right 09/12/2019   Procedure: REMOVAL RETAINED LENS;  Surgeon: [name], MD;  Location: [place];  Service: Ophthalmology;  Laterality: Right;   RETINAL DETACHMENT SURGERY Right 06/13/2019   Dr. Coralyn Pear   RIGHT/LEFT HEART CATH AND CORONARY/GRAFT ANGIOGRAPHY N/A 10/26/2021   Procedure: RIGHT/LEFT HEART CATH AND CORONARY/GRAFT ANGIOGRAPHY;  Surgeon: Adrian Prows, MD;  Location: [place] [facility];  Service: Cardiovascular;  Laterality: N/A;   SKIN GRAFT     face   SPACE OAR INSTILLATION N/A 07/19/2017   Procedure: SPACE OAR INSTILLATION;  Surgeon: Nickie Retort, MD;  Location[facility];  Service: Urology;  Laterality: N/A;   TEE WITHOUT CARDIOVERSION  03/20/2012   Procedure: TRANSESOPHAGEAL ECHOCARDIOGRAM (TEE);  Surgeon: Laverda Page, MD;  Location: [facility];  Service: Cardiovascular;  Laterality: N/A;   normal LV; normal EF; normal RV; normal LA w/ left atrial appendage very small, normal function, interatrial septum intact without defect; normal RA; trace MR,TR, & PI; mild AV calcification and senile degeneration w/ mild stenosis, AVA 1.7cm^2;;    TEE WITHOUT CARDIOVERSION N/A 05/30/2018   Procedure: TRANSESOPHAGEAL ECHOCARDIOGRAM (TEE);  Surgeon: [name], MD;  Location: [place];  Service: Open Heart Surgery;  Laterality: N/A;   TOTAL HIP ARTHROPLASTY Right 10/23/2017   Procedure: TOTAL HIP ARTHROPLASTY ANTERIOR APPROACH;  Surgeon: Frederik Pear, MD;  Location: [place];  Service: Orthopedics;  Laterality: Right;   TRANSTHORACIC ECHOCARDIOGRAM  01-11-2017   dr [name]  (per echo note, no significant change in seveity of AS, no other diagnostic change)   moderate concentric LVH, ef 75%, grade 1 diastolic dysfunction/  moderate LAE/  mild , grade 1 AR w/ moderate AV calcification, mild to moderate restricted AV leaflets w/ moderate AS, AVA 1.16cm^2, peak grandient 52mHg, mean grandient 357mg/  trace MR, mild calcification MV annulus , mild MV leaflet calcification, mild MVS, peak grandient 4.75m50m, mean grandient 2.7mm48m trace TR     Family History: Family History  Problem Relation Age of Onset   Stroke Mother 80  62ypertension Mother    Hypertension Father    Heart disease Father    Heart attack Father 70  53eart murmur Father    Hypertension Sister    Lung disease Brother    Colon cancer Neg Hx    Stomach cancer Neg Hx    Rectal cancer Neg Hx    Pancreatic cancer Neg Hx     Social History: Social History   Socioeconomic History   Marital status: Married    Spouse name: DonnButch Pennyumber of children: 2   Years of education: 14   Highest education level: Not on file  Occupational History   Not on file  Tobacco Use   Smoking status: Former    Packs/day: 0.25    Years: 1.00    Total pack years: 0.25    Types: Cigarettes    Quit date: 02/04/1967    Years since quitting: 55.5    Smokeless tobacco: Never   Tobacco comments:    smoked 1 q2-3 days  Vaping Use   Vaping Use: Never used  Substance and Sexual Activity   Alcohol use: Not Currently    Comment: occasional beer   Drug use: Never   Sexual activity: Yes  Other Topics Concern   Not on file  Social History Narrative   Patient is married (DonButch Penny Patient has two children.   Patient  is retired.   Patient has a college education.   Patient is right-handed.   Patient drinks two cups of coffee per day and 2-3 cups of Diet soda daily and limited tea.         Social Determinants of Health   Financial Resource Strain: Not on file  Food Insecurity: Not on file  Transportation Needs: Not on file  Physical Activity: Not on file  Stress: Not on file  Social Connections: Not on file    Allergies: No Known Allergies  Outpatient Meds: Current Outpatient Medications  Medication Sig Dispense Refill   apixaban (ELIQUIS) 5 MG TABS tablet Take 1 tablet (5 mg total) by mouth 2 (two) times daily. 180 tablet 3   apixaban (ELIQUIS) 5 MG TABS tablet Take 1 tablet (5 mg total) by mouth 2 (two) times daily. 60 tablet 0   atorvastatin (LIPITOR) 80 MG tablet TAKE 1 TABLET BY MOUTH  DAILY (Patient taking differently: Take 40 mg by mouth at bedtime.) 90 tablet 3   Bromfenac Sodium (PROLENSA) 0.07 % SOLN Place 1 drop into the right eye 2 (two) times daily. 6 mL 6   colesevelam (WELCHOL) 625 MG tablet Take 625 mg by mouth 3 (three) times daily.      donepezil (ARICEPT) 10 MG tablet Take 10 mg by mouth every morning.      dorzolamide-timolol (COSOPT) 22.3-6.8 MG/ML ophthalmic solution Place 1 drop into the right eye daily. 10 mL 6   esomeprazole (NEXIUM) 40 MG capsule Take 40 mg by mouth daily.      ferrous sulfate 325 (65 FE) MG tablet TAKE 1 TABLET BY MOUTH EVERY DAY WITH BREAKFAST 90 tablet 3   gabapentin (NEURONTIN) 300 MG capsule Take 300 mg by mouth at bedtime.     GEMTESA 75 MG TABS Take 1 tablet by mouth daily.      insulin glargine (LANTUS) 100 UNIT/ML injection Inject 20-40 Units into the skin at bedtime as needed (BGL below 140-150 take 20 units, if 151 or higher take 40 units).     Loteprednol Etabonate (LOTEMAX SM) 0.38 % GEL Place 1 drop into the right eye 2 (two) times daily. 5 g 6   memantine (NAMENDA) 10 MG tablet Take 1 tablet (10 mg total) by mouth 2 (two) times daily. Please call and make overdue appt for further refills. 2nd attempt (Patient taking differently: Take 5 mg by mouth 2 (two) times daily. Please call and make overdue appt for further refills. 2nd attempt) 30 tablet 0   metoprolol tartrate (LOPRESSOR) 25 MG tablet TAKE 1 TABLET BY MOUTH  DAILY (Patient taking differently: Take 12.5 mg by mouth 2 (two) times daily.) 90 tablet 3   omega-3 acid ethyl esters (LOVAZA) 1 G capsule Take 2 g by mouth daily.     rOPINIRole (REQUIP) 2 MG tablet Take 3-4 mg by mouth daily as needed (RLS).     spironolactone (ALDACTONE) 25 MG tablet TAKE ONE-HALF TABLET BY  MOUTH DAILY 45 tablet 3   tamsulosin (FLOMAX) 0.4 MG CAPS capsule Take 0.4 mg by mouth daily after supper.      valsartan-hydrochlorothiazide (DIOVAN-HCT) 80-12.5 MG tablet TAKE 1 TABLET BY MOUTH IN  THE MORNING 90 tablet 3   vitamin B-12 (CYANOCOBALAMIN) 1000 MCG tablet Take 1,000 mcg by mouth 2 (two) times daily.     nitroGLYCERIN (NITROSTAT) 0.4 MG SL tablet Place 1 tablet (0.4 mg total) under the tongue every 5 (five) minutes as needed for up to 25 days for  chest pain. 25 tablet 3   torsemide (DEMADEX) 20 MG tablet Take 1 tablet (20 mg total) by mouth as directed. 2 times a week on Tuesday and Thursday.  Take additional dose as directed with weight gain of 2 Lbs 90 tablet 1   No current facility-administered medications for this visit.      ___________________________________________________________________ Objective   Exam:  BP 132/60   Pulse 63   Wt 203 lb (92.1 kg)   BMI 30.87 kg/m  Wt Readings from Last 3 Encounters:   08/16/22 203 lb (92.1 kg)  05/18/22 208 lb (94.3 kg)  02/08/22 204 lb 12.8 oz (92.9 kg)   Wife present for entire visit General: Antalgic gait using a cane, gets on exam table with assistance Eyes: sclera anicteric, no redness ENT: oral mucosa moist without lesions, no cervical or supraclavicular lymphadenopathy CV: Regular, no JVD, no peripheral edema Resp: clear to auscultation bilaterally, normal RR and effort noted GI: soft, no tenderness, with active bowel sounds. No guarding or palpable organomegaly noted. Skin; warm and dry, no rash or jaundice noted Neuro: awake, alert and oriented x 3. Normal gross motor function and fluent speech  Labs:     Latest Ref Rng & Units 02/08/2022    9:38 AM 10/26/2021   10:28 AM 10/26/2021   10:25 AM  CBC  WBC 4.0 - 10.5 K/uL 5.6     Hemoglobin 13.0 - 17.0 g/dL 9.2  9.2  9.9   Hematocrit 39.0 - 52.0 % 27.7  27.0  29.0   Platelets 150 - 400 K/uL 203      Creatinine 1.02 September 2021  Assessment: Encounter Diagnoses  Name Primary?   Heme positive stool Yes   Iron deficiency anemia due to chronic blood loss     Longstanding multifactorial IDA from CKD and suspected chronic occult GI blood loss, was commonly AVMs.  Though no erosions were seen in the hiatal hernia sac on 2021 EGD, these can occur intermittently however, because of IDA. Blood loss worsened by [facility].  I am not surprised that his stool is heme positive given his overall clinical picture and what we know of this issue for him.  My opinion is that it does not change his current management with close hematologic follow-up, oral iron treatment with IV iron and/or transfusion per hematology parameters. No current plans for repeat endoscopic testing.  Regarding the reported bowel habit change with urgency and mucus, it sounds benign, I recommended a daily fiber supplement for more consistent stool form and retention as well as making a habit to take a bathroom trip shortly after each  meal.  Thank you for the courtesy of this consult.  Please call me with any questions or concerns.  Nelida Meuse III  CC: Referring provider noted above

## 2022-08-17 ENCOUNTER — Other Ambulatory Visit: Payer: Self-pay | Admitting: Oncology

## 2022-08-17 ENCOUNTER — Other Ambulatory Visit: Payer: Self-pay

## 2022-08-17 ENCOUNTER — Inpatient Hospital Stay: Payer: Medicare Other | Attending: Oncology | Admitting: Oncology

## 2022-08-17 ENCOUNTER — Inpatient Hospital Stay: Payer: Medicare Other

## 2022-08-17 VITALS — BP 137/52 | HR 62 | Temp 97.8°F | Resp 18 | Ht 68.0 in | Wt 203.7 lb

## 2022-08-17 DIAGNOSIS — D649 Anemia, unspecified: Secondary | ICD-10-CM

## 2022-08-17 DIAGNOSIS — C61 Malignant neoplasm of prostate: Secondary | ICD-10-CM | POA: Insufficient documentation

## 2022-08-17 DIAGNOSIS — D509 Iron deficiency anemia, unspecified: Secondary | ICD-10-CM | POA: Diagnosis present

## 2022-08-17 LAB — IRON AND IRON BINDING CAPACITY (CC-WL,HP ONLY)
Iron: 76 ug/dL (ref 45–182)
Saturation Ratios: 25 % (ref 17.9–39.5)
TIBC: 308 ug/dL (ref 250–450)
UIBC: 232 ug/dL (ref 117–376)

## 2022-08-17 LAB — CBC WITH DIFFERENTIAL (CANCER CENTER ONLY)
Abs Immature Granulocytes: 0.03 10*3/uL (ref 0.00–0.07)
Basophils Absolute: 0.1 10*3/uL (ref 0.0–0.1)
Basophils Relative: 1 %
Eosinophils Absolute: 0.2 10*3/uL (ref 0.0–0.5)
Eosinophils Relative: 4 %
HCT: 30.6 % — ABNORMAL LOW (ref 39.0–52.0)
Hemoglobin: 10.9 g/dL — ABNORMAL LOW (ref 13.0–17.0)
Immature Granulocytes: 1 %
Lymphocytes Relative: 23 %
Lymphs Abs: 1.1 10*3/uL (ref 0.7–4.0)
MCH: 29.9 pg (ref 26.0–34.0)
MCHC: 35.6 g/dL (ref 30.0–36.0)
MCV: 84.1 fL (ref 80.0–100.0)
Monocytes Absolute: 0.5 10*3/uL (ref 0.1–1.0)
Monocytes Relative: 9 %
Neutro Abs: 3 10*3/uL (ref 1.7–7.7)
Neutrophils Relative %: 62 %
Platelet Count: 176 10*3/uL (ref 150–400)
RBC: 3.64 MIL/uL — ABNORMAL LOW (ref 4.22–5.81)
RDW: 13.3 % (ref 11.5–15.5)
WBC Count: 4.8 10*3/uL (ref 4.0–10.5)
nRBC: 0 % (ref 0.0–0.2)

## 2022-08-17 LAB — FERRITIN: Ferritin: 162 ng/mL (ref 24–336)

## 2022-08-17 LAB — VITAMIN B12: Vitamin B-12: 289 pg/mL (ref 180–914)

## 2022-08-17 NOTE — Progress Notes (Signed)
[facility] Note  08/19/2022     CHIEF COMPLAINT Patient presents for Retina Follow Up  HISTORY OF PRESENT ILLNESS: Cody Phelps is a 82 y.o. male who presents to the clinic today for:  HPI     Retina Follow Up   This started 4 months ago.  Duration of 4 months.  Since onset it is stable.  I, the attending physician,  performed the HPI with the patient and updated documentation appropriately.        Comments   4 month retina follow up CME pt is reporting he has a little trouble with eyes working together at times but in general has not noticed a lot of changes he last saw Dr. [name] a few month ago and was told everything looked good       Last edited by [name], MD on 08/20/2022 10:36 AM.    Pt feels like his eyes are not working together to focus objects, he is still using Lotemax SM and Prolensa once day (supposed to be twice a day)  Referring physician: [name], MD [place],  [place] [address]  HISTORICAL INFORMATION:   Selected notes from the [place] Referred by Dr. [name] for concern of RD OD   CURRENT MEDICATIONS: Current Outpatient Medications (Ophthalmic Drugs)  Medication Sig   Bromfenac Sodium (PROLENSA) 0.07 % SOLN Place 1 drop into the right eye 2 (two) times daily.   dorzolamide-timolol (COSOPT) 22.3-6.8 MG/ML ophthalmic solution Place 1 drop into the right eye daily.   Loteprednol Etabonate (LOTEMAX SM) 0.38 % GEL Place 1 drop into the right eye 2 (two) times daily.   No current facility-administered medications for this visit. (Ophthalmic Drugs)   Current Outpatient Medications (Other)  Medication Sig   apixaban (ELIQUIS) 5 MG TABS tablet Take 1 tablet (5 mg total) by mouth 2 (two) times daily.   apixaban (ELIQUIS) 5 MG TABS tablet Take 1 tablet (5 mg total) by mouth 2 (two) times daily.   atorvastatin (LIPITOR) 80 MG tablet TAKE 1 TABLET BY MOUTH  DAILY (Patient taking  differently: Take 40 mg by mouth at bedtime.)   colesevelam (WELCHOL) 625 MG tablet Take 625 mg by mouth 3 (three) times daily.    donepezil (ARICEPT) 10 MG tablet Take 10 mg by mouth every morning.    esomeprazole (NEXIUM) 40 MG capsule Take 40 mg by mouth daily.    ferrous sulfate 325 (65 FE) MG tablet TAKE 1 TABLET BY MOUTH EVERY DAY WITH BREAKFAST   gabapentin (NEURONTIN) 300 MG capsule Take 300 mg by mouth at bedtime.   GEMTESA 75 MG TABS Take 1 tablet by mouth daily.   insulin glargine (LANTUS) 100 UNIT/ML injection Inject 20-40 Units into the skin at bedtime as needed (BGL below 140-150 take 20 units, if 151 or higher take 40 units).   memantine (NAMENDA) 10 MG tablet Take 1 tablet (10 mg total) by mouth 2 (two) times daily. Please call and make overdue appt for further refills. 2nd attempt (Patient taking differently: Take 5 mg by mouth 2 (two) times daily. Please call and make overdue appt for further refills. 2nd attempt)   metoprolol tartrate (LOPRESSOR) 25 MG tablet TAKE 1 TABLET BY MOUTH  DAILY (Patient taking differently: Take 12.5 mg by mouth 2 (two) times daily.)   nitroGLYCERIN (NITROSTAT) 0.4 MG SL tablet Place 1 tablet (0.4 mg total) under the tongue every 5 (five) minutes as needed for  up to 25 days for chest pain.   omega-3 acid ethyl esters (LOVAZA) 1 G capsule Take 2 g by mouth daily.   rOPINIRole (REQUIP) 2 MG tablet Take 3-4 mg by mouth daily as needed (RLS).   spironolactone (ALDACTONE) 25 MG tablet TAKE ONE-HALF TABLET BY  MOUTH DAILY   tamsulosin (FLOMAX) 0.4 MG CAPS capsule Take 0.4 mg by mouth daily after supper.    torsemide (DEMADEX) 20 MG tablet Take 1 tablet (20 mg total) by mouth as directed. 2 times a week on Tuesday and Thursday.  Take additional dose as directed with weight gain of 2 Lbs   valsartan-hydrochlorothiazide (DIOVAN-HCT) 80-12.5 MG tablet TAKE 1 TABLET BY MOUTH IN  THE MORNING   vitamin B-12 (CYANOCOBALAMIN) 1000 MCG tablet Take 1,000 mcg by mouth 2  (two) times daily.   No current facility-administered medications for this visit. (Other)   REVIEW OF SYSTEMS: ROS   Positive for: Gastrointestinal, Musculoskeletal, Endocrine, Cardiovascular, Eyes, Respiratory Negative for: Constitutional, Neurological, Skin, Genitourinary, HENT, Psychiatric, Allergic/Imm, Heme/Lymph Last edited by Parthenia Ames, COT on 08/19/2022 12:58 PM.     ALLERGIES No Known Allergies  PAST MEDICAL HISTORY Past Medical History:  Diagnosis Date   Acute anterior wall MI ([place]) 03/01/2018   Acute combined systolic and diastolic heart failure ([place]) 03/15/2018   Burn    Chronic diastolic CHF (congestive heart failure) ([place]) 09/21/2018   Defect, retina, with detachment    Dementia ([place])    Diverticulosis    ED (erectile dysfunction)    First degree heart block    GERD (gastroesophageal reflux disease)    Heart murmur    History of blood transfusion    with open heart surgery   Hypertension    cardiologsit-  dr [name]   Hypertensive retinopathy    OU   Iron deficiency anemia    Mixed hyperlipidemia    Morbid obesity ([place])    Myocardial infarction ([place])    02/28/18   OA (osteoarthritis)    right hip   OSA on CPAP    followed by dr dohmeier, uses CPAP   PAF (paroxysmal atrial fibrillation) ([place])    a. on Eliquis   Prostate cancer ([place])    dx 03-17-2017 (bx)  Stage T2a, Gleason 4+4, PSA  4.43, vol 32.32--  s/p radiatctive prostate seed implants 07-10-2017 then IMRT and ADT   Retinal detachment    Rheg. 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   LIMA to Diagonal Branch   S/P Maze operation for atrial fibrillation 05/30/2018   Complete bilateral atrial lesion set using bipolar radiofrequency and cryothermy ablation with clipping of LA appendage   Scoliosis    Trigger finger    Type 2  diabetes mellitus ([place])    Wears partial dentures    upper and lower   Past Surgical History:  Procedure Laterality Date   AORTIC VALVE REPLACEMENT N/A 05/30/2018   Procedure: AORTIC VALVE REPLACEMENT (AVR) using Inspiris Valve, Size 23;  Surgeon: [name], MD;  Location: [place];  Service: Open Heart Surgery;  Laterality: N/A;   APPENDECTOMY  1988   BILATERAL TOTAL ETHMOIDECTOMY AND SPHENOIDECTOMY  01-14-2009    dr Redmond Baseman   [facility]   CARDIOVERSION N/A 08/10/2018   Procedure: CARDIOVERSION;  Surgeon: Adrian Prows, MD;  Location: [place];  Service: Cardiovascular;  Laterality: N/A;   CARPAL TUNNEL RELEASE Bilateral 2013   CATARACT EXTRACTION Bilateral    Dr. [name]   CATARACT EXTRACTION W/ INTRAOCULAR LENS  IMPLANT, BILATERAL  date?   CORONARY ARTERY BYPASS GRAFT N/A 05/30/2018   CORONARY BALLOON ANGIOPLASTY N/A 03/01/2018   Procedure: CORONARY BALLOON ANGIOPLASTY;  Surgeon: Adrian Prows, MD;  Location: [place] [facility];  Service: Cardiovascular;  Laterality: N/A;   CORONARY/GRAFT ACUTE MI REVASCULARIZATION N/A 03/01/2018   Procedure: Coronary/Graft Acute MI Revascularization;  Surgeon: Adrian Prows, MD;  Location: [place] [facility];  Service: Cardiovascular;  Laterality: N/A;   CYSTOSCOPY  07/19/2017   Procedure: CYSTOSCOPY;  Surgeon: Nickie Retort, MD;  Location: [facility];  Service: Urology;;  No seeds found in [place] Bilateral    Cat Sx OU.  Dislocated IOL sx OD.  Rheg. RD repair OS.   EYE SURGERY     GAS/FLUID EXCHANGE Right 06/13/2019   Procedure: GAS/FLUID EXCHANGE;  Surgeon: [name], MD;  Location: [place];  Service: Ophthalmology;  Laterality: Right;   LAMINECTOMY WITH POSTERIOR LATERAL ARTHRODESIS LEVEL 2 N/A 02/12/2014   Procedure: LUMBAR TWO-THREE,LUIMBAR THREE-FOUR LAMINECTOMY/FORAMINOTOMY;POSSIBLE POSTEROLATERAL ARTHRODESIS WITH AUTOGRAFT;  Surgeon: [name], MD;  Location: [facility];  Service: Neurosurgery;  Laterality: N/A;    LEFT HEART CATH AND CORONARY ANGIOGRAPHY N/A 03/01/2018   Procedure: LEFT HEART CATH AND CORONARY ANGIOGRAPHY;  Surgeon: Adrian Prows, MD;  Location: [place] [facility];  Service: Cardiovascular;  Laterality: N/A;   MAZE N/A 05/30/2018   Procedure: MAZE;  Surgeon: [name], MD;  Location: [place];  Service: Open Heart Surgery;  Laterality: N/A;   ORIF RIGHT ANKLE FX'S  05/05/2001   retained hardware   PARS PLANA VITRECTOMY Right 06/13/2019   Procedure: PARS PLANA VITRECTOMY WITH 25 GAUGE WITH LASER AND GAS;  Surgeon: [name], MD;  Location: [place];  Service: Ophthalmology;  Laterality: Right;   PARS PLANA VITRECTOMY Right 09/12/2019   Procedure: Pars Plana Vitrectomy With 25 Gauge;  Surgeon: [name], MD;  Location: [place];  Service: Ophthalmology;  Laterality: Right;   PERFLUORONE INJECTION Right 06/13/2019   Procedure: PERFLUORONE INJECTION;  Surgeon: [name], MD;  Location: [place];  Service: Ophthalmology;  Laterality: Right;   PHOTOCOAGULATION WITH LASER Right 06/13/2019   Procedure: PHOTOCOAGULATION WITH LASER;  Surgeon: [name], MD;  Location: [place];  Service: Ophthalmology;  Laterality: Right;   PLACEMENT AND SUTURE OF SECONDARY INTRAOCULAR LENS Right 09/12/2019   Procedure: PLACEMENT AND SUTURE OF SECONDARY INTRAOCULAR LENS;  Surgeon: [name], MD;  Location: [place];  Service: Ophthalmology;  Laterality: Right;   PROSTATE BIOPSY  03-17-2017   dr Pilar Jarvis office   RADIOACTIVE SEED IMPLANT N/A 07/19/2017   Procedure: RADIOACTIVE SEED IMPLANT/BRACHYTHERAPY IMPLANT;  Surgeon: Nickie Retort, MD;  Location: [facility];  Service: Urology;  Laterality: N/A;  69 seeds implanted   REMOVAL RETAINED LENS Right 09/12/2019   Procedure: REMOVAL RETAINED LENS;  Surgeon: [name], MD;  Location: [place];  Service: Ophthalmology;  Laterality: Right;   RETINAL DETACHMENT SURGERY Right 06/13/2019   Dr. Coralyn Pear   RIGHT/LEFT HEART CATH AND CORONARY/GRAFT  ANGIOGRAPHY N/A 10/26/2021   Procedure: RIGHT/LEFT HEART CATH AND CORONARY/GRAFT ANGIOGRAPHY;  Surgeon: Adrian Prows, MD;  Location: [place] [facility];  Service: Cardiovascular;  Laterality: N/A;   SKIN GRAFT     face   SPACE OAR INSTILLATION N/A 07/19/2017   Procedure:  SPACE OAR INSTILLATION;  Surgeon: Nickie Retort, MD;  Location[facility];  Service: Urology;  Laterality: N/A;   TEE WITHOUT CARDIOVERSION  03/20/2012   Procedure: TRANSESOPHAGEAL ECHOCARDIOGRAM (TEE);  Surgeon: Laverda Page, MD;  Location: [facility];  Service: Cardiovascular;  Laterality: N/A;  normal LV; normal EF; normal RV; normal LA w/ left atrial appendage very small, normal function, interatrial septum intact without defect; normal RA; trace MR,TR, & PI; mild AV calcification and senile degeneration w/ mild stenosis, AVA 1.7cm^2;;    TEE WITHOUT CARDIOVERSION N/A 05/30/2018   Procedure: TRANSESOPHAGEAL ECHOCARDIOGRAM (TEE);  Surgeon: [name], MD;  Location: [place];  Service: Open Heart Surgery;  Laterality: N/A;   TOTAL HIP ARTHROPLASTY Right 10/23/2017   Procedure: TOTAL HIP ARTHROPLASTY ANTERIOR APPROACH;  Surgeon: Frederik Pear, MD;  Location: [place];  Service: Orthopedics;  Laterality: Right;   TRANSTHORACIC ECHOCARDIOGRAM  01-11-2017   dr [name]  (per echo note, no significant change in seveity of AS, no other diagnostic change)   moderate concentric LVH, ef 49%, grade 1 diastolic dysfunction/  moderate LAE/  mild , grade 1 AR w/ moderate AV calcification, mild to moderate restricted AV leaflets w/ moderate AS, AVA 1.16cm^2, peak grandient 97mHg, mean grandient 326mg/  trace MR, mild calcification MV annulus , mild MV leaflet calcification, mild MVS, peak grandient 4.97m31m, mean grandient 2.7mm82m trace TR   FAMILY HISTORY Family History  Problem Relation Age of Onset   Stroke Mother 80  76ypertension Mother    Hypertension Father    Heart disease Father    Heart attack Father 70  52Heart murmur Father    Hypertension Sister    Lung disease Brother    Colon cancer Neg Hx    Stomach cancer Neg Hx    Rectal cancer Neg Hx    Pancreatic cancer Neg Hx    SOCIAL HISTORY Social History   Tobacco Use   Smoking status: Former    Packs/day: 0.25    Years: 1.00    Total pack years: 0.25    Types: Cigarettes    Quit date: 02/04/1967    Years since quitting: 55.5   Smokeless tobacco: Never   Tobacco comments:    smoked 1 q2-3 days  Vaping Use   Vaping Use: Never used  Substance Use Topics   Alcohol use: Not Currently    Comment: occasional beer   Drug use: Never       OPHTHALMIC EXAM:  Base Eye Exam     Visual Acuity (Snellen - Linear)       Right Left   Dist [place] 20/40 20/30 -1   Dist ph [place] 20/30 20/25         Tonometry (Tonopen, 1:03 PM)       Right Left   Pressure 12 12         Pupils       Pupils Dark Light Shape React APD   Right PERRL 3 2 Round Sluggish None   Left PERRL 3 2 Round Sluggish None         Visual Fields       Left Right    Full Full         Extraocular Movement       Right Left    Full, Ortho Full, Ortho         Neuro/Psych     Oriented x3: Yes   Mood/Affect: Normal  Dilation     Both eyes: 2.5% Phenylephrine @ 1:03 PM           Slit Lamp and Fundus Exam     Slit Lamp Exam       Right Left   Lids/Lashes mild Meibomian gland dysfunction, mild Telangiectasia Dermatochalasis - upper lid, Meibomian gland dysfunction, Edema, Erythema lower lid, positive concretion left palp conj   Conjunctiva/Sclera White and quiet, STK ST quad -- gone Temporal Pinguecula   Cornea well healed superior cataract wound, 1-2+Punctate epithelial erosions, Arcus, tear film debris 2+inferior Punctate epithelial erosions, trace fine endo pigment, tear film debris   Anterior Chamber Deep, 1+fine cell/pigment Deep and quiet   Iris Round and dilated to 40m, scattered Transillumination defects 360, +iridodenesis,  +pigment deposition, +atrophy Round and dilated   Lens Sutured Akreos IOL well centered 3 piece Posterior chamber intraocular in good position, trace Posterior capsular opacification   Anterior Vitreous post vitrectomy, trace fine pigment Vitreous syneresis, Posterior vitreous detachment         Fundus Exam       Right Left   Disc 1-2+Pallor, Sharp rim 2+Pallor, Sharp rim, mild PPA/PPP, mild tilt, Compact   C/D Ratio 0.3 0.5   Macula Flat, Blunted foveal reflex, mild RPE mottling and clumping, trace central cystic changes -- slightly increased, +pigment deposition on retinal surface, rare MA, mild ERM Flat, Blunted foveal reflex, Retinal pigment epithelial mottling and clumping, No heme or edema   Vessels attenuated, Tortuous attenuated, Tortuous   Periphery attached, good 360 laser in place, rare MA/DBH; ORIGINALLY: bullous superior retinal detachment from 1000-0130, another more shallow detachment lobe from 130-400, lattice and micro tears ST quadrant; Old retinal tear at 0900 with surrounding laser Attached, peripheral laser scars at 1030, No new RT/RD           Refraction     Wearing Rx       Sphere Cylinder Axis Add   Right -1.25 +1.50 025 +2.75   Left -1.00 +1.75 180 +2.75           IMAGING AND PROCEDURES  Imaging and Procedures for _0 @  OCT, Retina - OU - Both Eyes       Right Eye Quality was good. Central Foveal Thickness: 325. Progression has worsened. Findings include normal foveal contour, no IRF, no SRF (Persistent IRF/cystic changes nasal fovea -- slightly increased, trace ERM).   Left Eye Quality was good. Central Foveal Thickness: 269. Progression has been stable. Findings include normal foveal contour, no IRF, no SRF.   Notes *Images captured and stored on drive  Diagnosis / Impression:  OD: Persistent IRF/cystic changes nasal fovea -- slightly increased, trace ERM OS: NFP, no IRF/SRF   Clinical management:  See below  Abbreviations: NFP -  Normal foveal profile. CME - cystoid macular edema. PED - pigment epithelial detachment. IRF - intraretinal fluid. SRF - subretinal fluid. EZ - ellipsoid zone. ERM - epiretinal membrane. ORA - outer retinal atrophy. ORT - outer retinal tubulation. SRHM - subretinal hyper-reflective material            ASSESSMENT/PLAN:    ICD-10-CM   1. Dislocation of intraocular lens, sequela  T85.22XS     2. Right retinal detachment  H33.21     3. 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 History of repair of retinal tear by laser photocoagulation  Z98.890     5. Diabetes mellitus type 2 without retinopathy ([place]  E11.9  6. Essential hypertension  I10     7. Hypertensive retinopathy of both eyes  H35.033     8. Pseudophakia of both eyes  Z96.1     9. Cystoid macular edema of right eye  H35.351 OCT, Retina - OU - Both Eyes    10. 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 Dislocated IOL OD  - pt with history of partially dislocated 3-piece IOL -- spontaneously dislocated completely into vitreous cavity on 1.23.21  - s/p 25g PPV w/ IOL explantation and secondary sutured IOL OD -- Akreos AO60 lens, 17.5D -- 02.11.2021             - IOL in good position  - 2 superior nylon sutures removed at slit lamp 03.12.21 -- last nylon suture removed at slit lamp, 04.30.21  - corneal edema resolved; PEE improved             - s/p STK OD #1 (07.09.21), #2 (01.26.22), #3 (08.30.22) for CME  - BCVA 20/30 - decreased  - OCT shows persistent IRF/cystic changes nasal fovea, trace ERM (see #9 below)  - IOP 12  - Ats QID OU              - f/u 4 months --DFE/OCT   2,3. Rhegmatogenous retinal detachment, right eye  - bullous, superior, mac off detachment, onset of foveal involvement 11.11.20 by history  - Bi-lobed superior detachment, superior lobe spanning 1000-0130, nasal lobe 0130-0400  - lattice degeneration with microtears noted at 1030  - s/p PPV/PFC/EL/FAX/14% C3F8 OD, 11.12.20              - intraop: HST at 2 oclock was found; also detachment had progressed to subtotal detachment spanning 9 oclock to 6 oclock (going in clockwise direction); also severe zonular insufficiency with IOL very mobile throughout case -- did not dislocate completely during the case or immediately post op  - doing well             - retina attached and in good position--good laser in place  4. History of retinal defects s/p laser retinopexy OU with Dr. Zigmund Daniel in 2013  - OD laser at 0900  - OS laser at 1030  - stable  5. Diabetes mellitus, type 2 without retinopathy  - A1c was 6.2 in August 2022  - The incidence, risk factors for progression, natural history and treatment options for diabetic retinopathy  were discussed with patient.    - The need for close monitoring of blood glucose, blood pressure, and serum lipids, avoiding cigarette or any type of tobacco, and the need for long term follow up was also discussed with patient.   6,7. Hypertensive retinopathy OU  - discussed importance of tight BP control  - monitor  8. Pseudophakia OU  - s/p CE/IOL OU (Dr. [name])  - 3 piece IOL OD was completely displaced into vitreous -- now with sutured Akreos IOL in good position OD -- see above  - OS 3-piece IOL in good position  - monitor  9. CME OD  - post op edema-- recurrent  - s/p STK OD #1 07.09.21, #2 (01.26.22), #3 (08.24.22)  - persistent cystic changes nasal fovea -- slightly increased  - pt and wife report decreased use of Lotemax and Prolensa -- qdaily instead of BID OD  - inc Lotemax SM BID OD and Prolensa to 2-3x/day OD  - cont Cosopt daily OD  - f/u 3 months DFE, OCT  10. Ocular Hypertension OD  - IOP: 12  - continue cosopt daily   Ophthalmic Meds Ordered this visit:  No orders of the defined types were placed in this encounter.    Return in about 3 months (around 11/18/2022) for f/u CME OD, DFE, OCT.  There are no Patient Instructions on file for this visit.  This  document serves as a record of services personally performed by Gardiner Sleeper, MD, PhD. It was created on their behalf by [name], MD, an ophthalmic technician. The creation of this record is the provider's dictation and/or activities during the visit.    Electronically signed by: [name], MD 08/17/2022 10:37 AM  Gardiner Sleeper, M.D., Ph.D. Diseases & Surgery of the Retina and [place] 08/19/2022   I have reviewed the above documentation for accuracy and completeness, and I agree with the above. Gardiner Sleeper, M.D., Ph.D. 08/20/22 10:42 AM  Abbreviations: M myopia (nearsighted); A astigmatism; H hyperopia (farsighted); P presbyopia; Mrx spectacle prescription;  CTL contact lenses; OD right eye; OS left eye; OU both eyes  XT exotropia; ET esotropia; PEK punctate epithelial keratitis; PEE punctate epithelial erosions; DES dry eye syndrome; MGD meibomian gland dysfunction; ATs artificial tears; PFAT's preservative free artificial tears; [place] nuclear sclerotic cataract; PSC posterior subcapsular cataract; ERM epi-retinal membrane; PVD posterior vitreous detachment; RD retinal detachment; DM diabetes mellitus; DR diabetic retinopathy; NPDR non-proliferative diabetic retinopathy; PDR proliferative diabetic retinopathy; CSME clinically significant macular edema; DME diabetic macular edema; dbh dot blot hemorrhages; CWS cotton wool spot; POAG primary open angle glaucoma; C/D cup-to-disc ratio; HVF humphrey visual field; GVF goldmann visual field; OCT optical coherence tomography; IOP intraocular pressure; BRVO Branch retinal vein occlusion; CRVO central retinal vein occlusion; CRAO central retinal artery occlusion; BRAO branch retinal artery occlusion; RT retinal tear; SB scleral buckle; PPV pars plana vitrectomy; VH Vitreous hemorrhage; PRP panretinal laser photocoagulation; IVK intravitreal kenalog; VMT vitreomacular traction; MH Macular hole;  NVD  neovascularization of the disc; NVE neovascularization elsewhere; AREDS age related eye disease study; ARMD age related macular degeneration; POAG primary open angle glaucoma; EBMD epithelial/anterior basement membrane dystrophy; ACIOL anterior chamber intraocular lens; IOL intraocular lens; PCIOL posterior chamber intraocular lens; Phaco/IOL phacoemulsification with intraocular lens placement; [place] photorefractive keratectomy; LASIK laser assisted in situ keratomileusis; HTN hypertension; DM diabetes mellitus; COPD chronic obstructive pulmonary disease

## 2022-08-17 NOTE — Progress Notes (Signed)
Hematology and Oncology Follow Up Visit  Cody Phelps [id number] February 21, 1941 82 y.o. 08/17/2022 9:50 AM Cody Phelps, MDSouth, Cody Main, MD   Principle Diagnosis: 82 year old man with iron deficiency anemia diagnosed in 2019.  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  Secondary diagnosis: Prostate cancer presented with localized disease Gleason score of 4+4 = 8 and a PSA 4.4 in 2018.  Prior Therapy:  Definitive radiation therapy utilizing IMRT between August 15, 2017 and September 18, 2017.  He also received brachytherapy boost upfront on January 4 of 2019.  He received 45 Gy in 25 fraction to supplement his post implant of 110 Gy.  He completed androgen deprivation therapy under the care of Dr. Junious Silk for close to 2 years.  He status post IV iron infusion in August 2019.  He received Feraheme in November 2020.  Current therapy: Oral iron and repeat IV iron infusion as needed.  Interim History: [name] presents today for a repeat evaluation.  Since the last visit, he was referred to gastroenterology for heme positive stool currently under evaluation.  His hemoglobin last year have ranged between 9 and 11 without any further decline.  He remains on Eliquis currently without any hematochezia or melena.  He continues to take oral iron replacement without any issues at this time.  Denies any constipation or abdominal pain.  His performance status quality of life remained reasonable.  He denies any decline in his activity level.       Medications: Reviewed without changes. Current Outpatient Medications  Medication Sig Dispense Refill   apixaban (ELIQUIS) 5 MG TABS tablet Take 1 tablet (5 mg total) by mouth 2 (two) times daily. 180 tablet 3   apixaban (ELIQUIS) 5 MG TABS tablet Take 1 tablet (5 mg total) by mouth 2 (two) times daily. 60 tablet 0   atorvastatin (LIPITOR) 80 MG tablet TAKE 1 TABLET BY MOUTH  DAILY (Patient taking differently: Take 40 mg by mouth  at bedtime.) 90 tablet 3   Bromfenac Sodium (PROLENSA) 0.07 % SOLN Place 1 drop into the right eye 2 (two) times daily. 6 mL 6   colesevelam (WELCHOL) 625 MG tablet Take 625 mg by mouth 3 (three) times daily.      donepezil (ARICEPT) 10 MG tablet Take 10 mg by mouth every morning.      dorzolamide-timolol (COSOPT) 22.3-6.8 MG/ML ophthalmic solution Place 1 drop into the right eye daily. 10 mL 6   esomeprazole (NEXIUM) 40 MG capsule Take 40 mg by mouth daily.      ferrous sulfate 325 (65 FE) MG tablet TAKE 1 TABLET BY MOUTH EVERY DAY WITH BREAKFAST 90 tablet 3   gabapentin (NEURONTIN) 300 MG capsule Take 300 mg by mouth at bedtime.     GEMTESA 75 MG TABS Take 1 tablet by mouth daily.     insulin glargine (LANTUS) 100 UNIT/ML injection Inject 20-40 Units into the skin at bedtime as needed (BGL below 140-150 take 20 units, if 151 or higher take 40 units).     Loteprednol Etabonate (LOTEMAX SM) 0.38 % GEL Place 1 drop into the right eye 2 (two) times daily. 5 g 6   memantine (NAMENDA) 10 MG tablet Take 1 tablet (10 mg total) by mouth 2 (two) times daily. Please call and make overdue appt for further refills. 2nd attempt (Patient taking differently: Take 5 mg by mouth 2 (two) times daily. Please call and make overdue appt for further refills. 2nd attempt) 30 tablet 0  metoprolol tartrate (LOPRESSOR) 25 MG tablet TAKE 1 TABLET BY MOUTH  DAILY (Patient taking differently: Take 12.5 mg by mouth 2 (two) times daily.) 90 tablet 3   nitroGLYCERIN (NITROSTAT) 0.4 MG SL tablet Place 1 tablet (0.4 mg total) under the tongue every 5 (five) minutes as needed for up to 25 days for chest pain. 25 tablet 3   omega-3 acid ethyl esters (LOVAZA) 1 G capsule Take 2 g by mouth daily.     rOPINIRole (REQUIP) 2 MG tablet Take 3-4 mg by mouth daily as needed (RLS).     spironolactone (ALDACTONE) 25 MG tablet TAKE ONE-HALF TABLET BY  MOUTH DAILY 45 tablet 3   tamsulosin (FLOMAX) 0.4 MG CAPS capsule Take 0.4 mg by mouth daily  after supper.      torsemide (DEMADEX) 20 MG tablet Take 1 tablet (20 mg total) by mouth as directed. 2 times a week on Tuesday and Thursday.  Take additional dose as directed with weight gain of 2 Lbs 90 tablet 1   valsartan-hydrochlorothiazide (DIOVAN-HCT) 80-12.5 MG tablet TAKE 1 TABLET BY MOUTH IN  THE MORNING 90 tablet 3   vitamin B-12 (CYANOCOBALAMIN) 1000 MCG tablet Take 1,000 mcg by mouth 2 (two) times daily.     No current facility-administered medications for this visit.     Allergies: No Known Allergies      Physical Exam:     Blood pressure (!) 137/52, pulse 62, temperature 97.8 F (36.6 C), temperature source Temporal, resp. rate 18, height '5\' 8"'$  (1.727 m), weight 203 lb 11.2 oz (92.4 kg), SpO2 100 %.    ECOG: 1   General appearance: Comfortable appearing without any discomfort Head: Normocephalic without any trauma Oropharynx: Mucous membranes are moist and pink without any thrush or ulcers. Eyes: Pupils are equal and round reactive to light. Lymph nodes: No cervical, supraclavicular, inguinal or axillary lymphadenopathy.   Heart:regular rate and rhythm.  S1 and S2 without leg edema. Lung: Clear without any rhonchi or wheezes.  No dullness to percussion. Abdomin: Soft, nontender, nondistended with good bowel sounds.  No hepatosplenomegaly. Musculoskeletal: No joint deformity or effusion.  Full range of motion noted. Neurological: No deficits noted on motor, sensory and deep tendon reflex exam. Skin: No petechial rash or dryness.  Appeared moist.          Lab Results: Lab Results  Component Value Date   WBC 5.6 02/08/2022   HGB 9.2 (L) 02/08/2022   HCT 27.7 (L) 02/08/2022   MCV 85.5 02/08/2022   PLT 203 02/08/2022   PSA 2.61 06/07/2006     Chemistry      Component Value Date/Time   NA 140 10/26/2021 1028   NA 138 10/20/2021 1607   K 3.5 10/26/2021 1028   CL 101 10/20/2021 1607   CO2 22 10/20/2021 1607   BUN 18 10/20/2021 1607   CREATININE  1.03 10/20/2021 1607      Component Value Date/Time   CALCIUM 9.3 10/20/2021 1607   ALKPHOS 51 09/03/2021 1454   AST 14 09/03/2021 1454   ALT 10 09/03/2021 1454   BILITOT 0.4 09/03/2021 1454   BILITOT 0.4 09/15/2020 1154          Impression and Plan:  82 year old man with:    1.  Multifactorial anemia including iron deficiency as well as her chronic disease.  Myelodysplastic syndrome cannot be completely ruled out.   The natural course of this disease was reviewed and the differential diagnosis was reiterated.  He has been  referred to gastroenterology for possible GI source of bleeding and we will update his iron studies at this time.  If he remains deficient in iron consideration for IV iron could be made at this time.  If his hemoglobin continues to drop despite no obvious GI bleeding and normal iron, a bone marrow biopsy would be recommended to evaluate for other etiologies.  CBC personally reviewed today and showed improvement of his hemoglobin that is currently 10.9.  Based on these findings, I do not recommend any further IV iron infusion or a bone marrow biopsy.  I recommended continued surveillance.  2.  Prostate cancer diagnosed in 2018.  He has follow-up with urology regarding this issue.  No evidence of relapsed disease.    3.  Follow-up: In 6 months for repeat follow-up.  30  minutes were dedicated to this encounter.  The time was spent on updating disease status, treatment choices and outlining future plan of care review.   Zola Button, MD 1/17/20249:50 AM

## 2022-08-19 ENCOUNTER — Ambulatory Visit (INDEPENDENT_AMBULATORY_CARE_PROVIDER_SITE_OTHER): Payer: Medicare Other | Admitting: Ophthalmology

## 2022-08-19 DIAGNOSIS — H35351 Cystoid macular degeneration, right eye: Secondary | ICD-10-CM | POA: Diagnosis not present

## 2022-08-19 DIAGNOSIS — I10 Essential (primary) hypertension: Secondary | ICD-10-CM | POA: Diagnosis not present

## 2022-08-19 DIAGNOSIS — H3321 Serous retinal detachment, right eye: Secondary | ICD-10-CM

## 2022-08-19 DIAGNOSIS — Z961 Presence of intraocular lens: Secondary | ICD-10-CM

## 2022-08-19 DIAGNOSIS — H35033 Hypertensive retinopathy, bilateral: Secondary | ICD-10-CM

## 2022-08-19 DIAGNOSIS — H35411 Lattice degeneration of retina, right eye: Secondary | ICD-10-CM

## 2022-08-19 DIAGNOSIS — H40051 Ocular hypertension, right eye: Secondary | ICD-10-CM

## 2022-08-19 DIAGNOSIS — E119 Type 2 diabetes mellitus without complications: Secondary | ICD-10-CM

## 2022-08-19 DIAGNOSIS — Z9889 Other specified postprocedural states: Secondary | ICD-10-CM

## 2022-08-19 DIAGNOSIS — T8522XS Displacement of intraocular lens, sequela: Secondary | ICD-10-CM

## 2022-08-20 ENCOUNTER — Encounter (INDEPENDENT_AMBULATORY_CARE_PROVIDER_SITE_OTHER): Payer: Self-pay | Admitting: Ophthalmology

## 2022-09-05 ENCOUNTER — Other Ambulatory Visit: Payer: Self-pay | Admitting: Oncology

## 2022-10-18 ENCOUNTER — Other Ambulatory Visit (INDEPENDENT_AMBULATORY_CARE_PROVIDER_SITE_OTHER): Payer: Self-pay

## 2022-10-18 MED ORDER — LOTEMAX SM 0.38 % OP GEL: 1.0000 [drp] | Freq: Two times a day (BID) | OPHTHALMIC | 6 refills | Status: DC

## 2022-10-18 MED ORDER — BROMFENAC SODIUM 0.07 % OP SOLN: 1.0000 [drp] | Freq: Two times a day (BID) | OPHTHALMIC | 6 refills | Status: DC

## 2022-10-20 ENCOUNTER — Other Ambulatory Visit (INDEPENDENT_AMBULATORY_CARE_PROVIDER_SITE_OTHER): Payer: Self-pay

## 2022-10-20 MED ORDER — LOTEMAX SM 0.38 % OP GEL: 1.0000 [drp] | Freq: Two times a day (BID) | OPHTHALMIC | 6 refills | Status: DC

## 2022-10-20 MED ORDER — BROMFENAC SODIUM 0.07 % OP SOLN: 1.0000 [drp] | Freq: Two times a day (BID) | OPHTHALMIC | 6 refills | Status: DC

## 2022-11-15 ENCOUNTER — Ambulatory Visit: Payer: Medicare Other | Admitting: Cardiology

## 2022-11-15 ENCOUNTER — Encounter: Payer: Self-pay | Admitting: Cardiology

## 2022-11-15 VITALS — BP 132/66 | HR 70 | Resp 16 | Ht 68.0 in | Wt 198.2 lb

## 2022-11-15 DIAGNOSIS — I25118 Atherosclerotic heart disease of native coronary artery with other forms of angina pectoris: Secondary | ICD-10-CM

## 2022-11-15 DIAGNOSIS — I4821 Permanent atrial fibrillation: Secondary | ICD-10-CM | POA: Insufficient documentation

## 2022-11-15 DIAGNOSIS — I484 Atypical atrial flutter: Secondary | ICD-10-CM

## 2022-11-15 DIAGNOSIS — Z953 Presence of xenogenic heart valve: Secondary | ICD-10-CM

## 2022-11-15 DIAGNOSIS — I10 Essential (primary) hypertension: Secondary | ICD-10-CM

## 2022-11-15 DIAGNOSIS — I5032 Chronic diastolic (congestive) heart failure: Secondary | ICD-10-CM

## 2022-11-15 DIAGNOSIS — I480 Paroxysmal atrial fibrillation: Secondary | ICD-10-CM

## 2022-11-15 MED ORDER — APIXABAN 5 MG PO TABS: 5.0000 mg | ORAL_TABLET | Freq: Two times a day (BID) | ORAL | 3 refills | Status: DC

## 2022-11-15 MED ORDER — APIXABAN 5 MG PO TABS: 5.0000 mg | ORAL_TABLET | Freq: Two times a day (BID) | ORAL | 3 refills | Status: AC

## 2022-11-15 NOTE — Progress Notes (Unsigned)
Primary Physician/Referring:  Adrian Prince, MD  Patient ID: Cody Phelps, male    DOB: 1940/12/08, 82 y.o.   MRN: [id number]  Chief Complaint  Patient presents with   AV replacement    Congestive Heart Failure   Hypertension   Follow-up    6 months    HPI:    HPI: Cody Phelps  is a 82 y.o. 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  Due to an episode of syncope on 09/03/2021 seen in the ED, outpatient extended EKG monitoring it revealed NSVT, hence underwent cardiac catheterization revealing no significant progression of the disease on 10/18/2021.  Patient presents here for 80-month office visit.  Patient states that his dyspnea has remained stable.  He has not had any dizziness or near syncope.  He is accompanied by his wife.  Denies chest pain or palpitations.   Social History   Tobacco Use   Smoking status: Former    Packs/day: 0.25    Years: 1.00    Additional pack years: 0.00    Total pack years: 0.25    Types: Cigarettes    Quit date: 02/04/1967    Years since quitting: 55.8   Smokeless tobacco: Never   Tobacco comments:    smoked 1 q2-3 days  Substance Use Topics   Alcohol use: Not Currently    Comment: occasional beer   Marital Status: Married   ROS  Review of Systems  Cardiovascular:  Positive for dyspnea on exertion. Negative for chest pain, leg swelling, near-syncope, orthopnea, paroxysmal nocturnal dyspnea and syncope.  Gastrointestinal:  Negative for  melena.    Objective  Blood pressure 132/66, pulse 70, resp. rate 16, height 5\' 8"  (1.727 m), weight 198 lb 3.2 oz (89.9 kg), SpO2 94 %. Body mass index is 30.14 kg/m.      11/15/2022    1:10 PM 08/17/2022   10:47 AM 08/16/2022    1:45 PM  Vitals with BMI  Height 5\' 8"  5\' 8"    Weight 198 lbs 3 oz 203 lbs 11 oz 203 lbs  BMI 30.14 30.98   Systolic 132 137 119  Diastolic 66 52 60  Pulse 70 62 63  Orthostatic VS for the past 72 hrs (Last 3 readings):  Patient Position BP Location Cuff Size  11/15/22 1310 Sitting Left Arm Large   Physical Exam Vitals reviewed.  Constitutional:      Appearance: He is well-developed. He is obese.  Neck:     Vascular: No JVD.  Cardiovascular:     Rate and Rhythm: Normal rate and regular rhythm.     Pulses: Intact distal pulses.          Carotid pulses are 2+ on the right side with bruit and 2+ on the left side with bruit.      Popliteal pulses are 2+ on the right side and 2+ on the left side.       Dorsalis pedis pulses are 2+ on the right side and 1+ on the left side.       Posterior tibial pulses  are 0 on the right side and 0 on the left side.     Heart sounds: Murmur heard.     Early systolic murmur is present with a grade of 2/6 at the upper right sternal border radiating to the apex.     No gallop.  Pulmonary:     Effort: Pulmonary effort is normal.     Breath sounds: Normal breath sounds.  Abdominal:     General: Bowel sounds are normal.     Palpations: Abdomen is soft.  Musculoskeletal:     Right lower leg: No edema.     Left lower leg: No edema.    Laboratory examination:   Lab Results  Component Value Date   NA 140 10/26/2021   K 3.5 10/26/2021   CO2 22 10/20/2021   GLUCOSE 110 (H) 10/20/2021   BUN 18 10/20/2021   CREATININE 1.03 10/20/2021   CALCIUM 9.3 10/20/2021   EGFR 74 10/20/2021   GFRNONAA >60 09/03/2021       Latest Ref Rng & Units 10/26/2021   10:28 AM 10/26/2021   10:25 AM 10/26/2021   10:18 AM  CMP  Sodium  135 - 145 mmol/L 140  141  141   Potassium 3.5 - 5.1 mmol/L 3.5  3.6  3.6       Latest Ref Rng & Units 08/17/2022   10:09 AM 02/08/2022    9:38 AM 10/26/2021   10:28 AM  CBC  WBC 4.0 - 10.5 K/uL 4.8  5.6    Hemoglobin 13.0 - 17.0 g/dL 16.1  9.2  9.2   Hematocrit 39.0 - 52.0 % 30.6  27.7  27.0   Platelets 150 - 400 K/uL 176  203      ProBNP (last 3 results) No results for input(s): "PROBNP" in the last 8760 hours.  External labs:  Labs 10/11/2022:  Serum creatinine 1.230.  BUN 21.  Serum glucose 127.  Sodium 138.  Potassium 3.7.  eGFR 60 mL.  Urinary albumin to creatinine ratio 103.  Hemoglobin 11.4/hematocrit 32.2, normal indicis.  Platelets 188.  Total cholesterol 150, triglycerides 234, HDL 41, LDL 62.  TSH normal at 1.21.  Hemoccult positive by immunoassay.   Radiology  Chest x-ray 09/03/2021: Prior median sternotomy. Heart is normal size. No confluent airspace opacities or effusions. No acute bony abnormality.  IMPRESSION: No active disease.   CTA PE 09/03/2021: 1. No evidence of significant pulmonary embolus. 2. No evidence of active pulmonary disease. 3. Postoperative changes. 4. Aortic atherosclerosis. 5. Small esophageal hiatal hernia.  CT Chest 06/12/2020: 1. There is a rounded ground-glass opacity of the central left lower lobe measuring 2.8 x 2.5 cm with a solid central nodule measuring 4 mm. This is concerning for adenocarcinoma. Comparison to prior examination dated 03/17/2018 is not possible due to the presence of pleural effusions and atelectasis at that time. Given small size, PET-CT is likely of limited utility to assess for metabolic activity. Recommend initial follow-up examination at 3-6 months to assess for persistence. If unchanged, and solid component remains <6 mm, annual CT is recommended until 5 years of stability has been established. If persistent these nodules should be considered highly suspicious if the solid component of the nodule is 6 mm  or greater in size and enlarging. This recommendation follows the consensus statement: Guidelines for Management of Incidental Pulmonary Nodules Detected on CT Images  2. Scattered ground-glass opacity of the dependent left lung base, generally nonspecific and likely scarring and/or partial atelectasis. 3. Coronary artery  disease. 4. Status post aortic valve replacement. 5. Aortic Atherosclerosis (ICD10-I70.0).  Cardiac Studies:  Echocardiogram 10/05/2021:  Normal LV systolic function with EF 59%. Left ventricle cavity is normal  in size. Normal global wall motion. Indeterminate LAP due to severe mitral  annular calcification.  Calculated EF 59%.  Left atrial cavity is severely dilated at 5 cm.  Bioprosthetic aortic valve. No evidence of aortic stenosis. No aortic  valve regurgitation.  Moderate calcification of the mitral valve annulus. Mild mitral stenosis.  Mild (Grade I) mitral regurgitation. Mild mitral valve leaflet thickening  with moderate calcification. Mildly restricted mitral valve leaflets.  Mitral valve peak pressure gradient 12.6, mean pressure gradient 4.4 mmHg,  calculated mitral valve area by pressure half-time is 1.9 cm.  No significant change since 03/15/2021. Mitral stenosis new.   Lexiscan Nuclear stress test 10/05/2021: Non-diagnostic ECG stress due to pharmacologic stress.  Myocardial perfusion is abnormal. There is a fixed defect with no uptake in the apical region. There is small to moderate sized reversible ischemia in the inferior region.  Overall LV systolic function is normal with inferior wall hypokinesis. Stress LV EF: 78%.  No previous exam available for comparison. Intermediate risk study.   Ambulatory cardiac telemetry 14 days (09/07/2021 - 09/21/2021): Patient has known permanent atrial flutter with heart rate ranging from 31 to 96 bpm, average heart rate 63 bpm.  Patient did have 7 ventricular pauses 3.0-3.2 secs during sleep hours.  Rare PVCs.  No  patient triggered events.  No evidence of ventricular tachycardia, high degree AV block.   Carotid artery duplex 05/20/2018: Right Carotid: Velocities in the right ICA are consistent with a 1-39% stenosis.  Left Carotid: Velocities in the left ICA are consistent with a 1-39% Stenosis.  Right and left heart catheterization 10/26/2021:   Procedural data:  RA pressure 10/9 mean 7 mm mercury. RA saturation 73%.  RV pressure 52/3 and Right ventricular EDP 7 mm Hg. PA pressure 62/17 with a mean of 32 mm mercury. PA saturation 68%.  Pulmonary capillary wedge 23/40 with a mean of 25 mm Hg. Aortic saturation 97%.  Qp:Qs: 0.83. Cardiac output was 7.76 with cardiac index of 3.7 by Fick.  Pulmonary vascular resistance 0.93  LV: 160/15 LVEDP 16. LVEF 55 Ao: 151/43 mean Ao pressure 89. No PG across the AV.    Dominance: Left LM: Large vessel, mild disease, mild calcification. LAD: Large vessel, moderate-sized D1 with ostial 60% stenosis.  LIMA to D1 is widely patent with competitive filling. LCx: Large and dominant.  Very mild disease. RI: N/A RCA: Small and nondominant, diffusely diseased.  EKG   EKG 11/15/2022: Sinus rhythm with first-degree AV block at the rate of 68 bpm, left axis deviation, left anterior fascicular block.  Incomplete right bundle branch block.  Poor R progression, cannot exclude anterolateral infarct old.  Nonspecific T abnormality.  Compared to 05/18/2022, atrial flutter has been replaced by sinus with first-degree AV block.   EKG 05/18/2022: Atypical atrial flutter with 3: 1 conduction, ventricular rate 63 bpm.  Left axis deviation, left anterior fascicular block.  Poor R wave progression, cannot exclude anterolateral infarct old.  IVCD, borderline criteria for LVH.  Nonspecific T abnormality.  Compared to 11/15/2021, no significant change.   Allergies & Medications   No Known Allergies    Current Outpatient Medications:    atorvastatin (LIPITOR) 80 MG tablet, TAKE 1  TABLET BY MOUTH  DAILY (Patient taking differently: Take 40 mg by mouth at bedtime.), Disp: 90 tablet, Rfl: 3  Bromfenac Sodium (PROLENSA) 0.07 % SOLN, Place 1 drop into the right eye 2 (two) times daily., Disp: 6 mL, Rfl: 6   colesevelam (WELCHOL) 625 MG tablet, Take 625 mg by mouth 3 (three) times daily. , Disp: , Rfl:    donepezil (ARICEPT) 10 MG tablet, Take 10 mg by mouth every morning. , Disp: , Rfl:    dorzolamide-timolol (COSOPT) 22.3-6.8 MG/ML ophthalmic solution, Place 1 drop into the right eye daily., Disp: 10 mL, Rfl: 6   esomeprazole (NEXIUM) 40 MG capsule, Take 40 mg by mouth daily. , Disp: , Rfl:    ferrous sulfate 325 (65 FE) MG tablet, TAKE 1 TABLET BY MOUTH EVERY DAY WITH BREAKFAST, Disp: 90 tablet, Rfl: 3   gabapentin (NEURONTIN) 300 MG capsule, Take 300 mg by mouth at bedtime., Disp: , Rfl:    GEMTESA 75 MG TABS, Take 1 tablet by mouth daily., Disp: , Rfl:    insulin glargine (LANTUS) 100 UNIT/ML injection, Inject 20-40 Units into the skin at bedtime as needed (BGL below 140-150 take 20 units, if 151 or higher take 40 units)., Disp: , Rfl:    Loteprednol Etabonate (LOTEMAX SM) 0.38 % GEL, Place 1 drop into the right eye 2 (two) times daily., Disp: 5 g, Rfl: 6   memantine (NAMENDA) 10 MG tablet, Take 1 tablet (10 mg total) by mouth 2 (two) times daily. Please call and make overdue appt for further refills. 2nd attempt (Patient taking differently: Take 5 mg by mouth 2 (two) times daily. Please call and make overdue appt for further refills. 2nd attempt), Disp: 30 tablet, Rfl: 0   metoprolol tartrate (LOPRESSOR) 25 MG tablet, TAKE 1 TABLET BY MOUTH  DAILY (Patient taking differently: Take 12.5 mg by mouth 2 (two) times daily.), Disp: 90 tablet, Rfl: 3   nitroGLYCERIN (NITROSTAT) 0.4 MG SL tablet, Place 1 tablet (0.4 mg total) under the tongue every 5 (five) minutes as needed for up to 25 days for chest pain., Disp: 25 tablet, Rfl: 3   omega-3 acid ethyl esters (LOVAZA) 1 G capsule,  Take 2 g by mouth daily., Disp: , Rfl:    rOPINIRole (REQUIP) 2 MG tablet, Take 3-4 mg by mouth daily as needed (RLS)., Disp: , Rfl:    spironolactone (ALDACTONE) 25 MG tablet, TAKE ONE-HALF TABLET BY  MOUTH DAILY, Disp: 45 tablet, Rfl: 3   tamsulosin (FLOMAX) 0.4 MG CAPS capsule, Take 0.4 mg by mouth daily after supper. , Disp: , Rfl:    valsartan-hydrochlorothiazide (DIOVAN-HCT) 80-12.5 MG tablet, TAKE 1 TABLET BY MOUTH IN  THE MORNING, Disp: 90 tablet, Rfl: 3   apixaban (ELIQUIS) 5 MG TABS tablet, Take 1 tablet (5 mg total) by mouth 2 (two) times daily., Disp: 180 tablet, Rfl: 3   torsemide (DEMADEX) 20 MG tablet, Take 1 tablet (20 mg total) by mouth as directed. 2 times a week on Tuesday and Thursday.  Take additional dose as directed with weight gain of 2 Lbs, Disp: 90 tablet, Rfl: 1   vitamin B-12 (CYANOCOBALAMIN) 1000 MCG tablet, Take 1,000 mcg by mouth 2 (two) times daily. (Patient not taking: Reported on 11/15/2022), Disp: , Rfl:    Assessment     ICD-10-CM   1. Coronary artery disease of native artery of native heart with stable angina pectoris  I25.118 EKG 12-Lead    2. Chronic diastolic CHF (congestive heart failure)  I50.32     3. Paroxysmal atrial fibrillation  I48.0 apixaban (ELIQUIS) 5 MG TABS tablet    4.  S/P aortic valve replacement with bioprosthetic valve  Z95.3     5. Essential hypertension  I10     6. Atypical atrial flutter  I48.4       CHA2DS2-VASc Score is 5.  Yearly risk of stroke: 7.2% (A, HTN, DM, Vasc Dz).  Score of 1=0.6; 2=2.2; 3=3.2; 4=4.8; 5=7.2; 6=9.8; 7=>9.8) -(CHF; HTN; vasc disease DM,  Male = 1; Age <65 =0; 65-74 = 1,  >75 =2; stroke/embolism= 2).    Recommendations:   Cody Phelps  is a 82 y.o. 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  hypertension, hyperlipidemia, obstructive sleep apnea on CPAP, permanent atrial fibrillation/atypical a flutter, controlled diabetes mellitus, mild obesity, mostly truncal obesity,    1. Coronary artery disease of native artery of native heart with stable angina pectoris Patient presents for a 30-month office visit and follow-up of coronary disease.  He has remained stable without recurrence of angina pectoris.  2. Chronic diastolic CHF (congestive heart failure) Patient has done remarkably well, has lost about 6 to 8 pounds in weight, he has not had any recent heart failure symptoms.  He uses torsemide on a as needed basis, but over the past 3 to 4 weeks he has not used any as he has not had any worsening dyspnea or leg edema.  Suspect transition to sinus rhythm may have contributed to improvement in chronic diastolic heart failure.  3.  Paroxysmal atrial fibrillation Patient was deemed permanent atrial fibrillation/atypical atrial flutter, however today the EKG reveals underlying sinus rhythm with first-degree AV block.  Patient also has noticed mild improvement in his dyspnea.  Continue Eliquis.  He does have chronic anemia but hemoglobin has remained stable and in fact has increased.  4. S/P aortic valve replacement with bioprosthetic valve Prosthetic aortic valve sounds appear to be stable.  Do not think he needs repeat echocardiogram at this stage.  I will consider echocardiogram in 6 months.  5. Essential hypertension Blood pressure is well-controlled.  No changes in the medications were done today.  I reviewed his external labs, renal function has remained stable as well.   Yates Decamp, MD, [facility] 11/15/2022, 1:44 PM Office: [phone] Fax: [phone] Pager: [phone]

## 2022-11-17 NOTE — Progress Notes (Signed)
[facility] - Clinic Note  11/18/2022     CHIEF COMPLAINT Patient presents for Retina Follow Up  HISTORY OF PRESENT ILLNESS: Cody Phelps is a 82 y.o. male who presents to the clinic today for:  HPI     Retina Follow Up   Patient presents with  Other.  In right eye.  Severity is moderate.  Duration of 3 months.  Since onset it is stable.  I, the attending physician,  performed the HPI with the patient and updated documentation appropriately.        Comments   Pt here for 3 mo ret f/u CME OD. PT states VA is the same, trouble focusing w/ rx specs. Pt reports still using gtts.       Last edited by Rennis Chris, MD on 11/21/2022  1:39 AM.    Pt is using Lotemax, Prolensa and Cosopt BID OD, he was out of Prolensa for about 3 weeks   Referring physician: Adrian Prince, MD [address] [place],  [place] [address]  HISTORICAL INFORMATION:   Selected notes from the MEDICAL RECORD NUMBER Referred by Dr. [place] DeMarco for concern of RD OD   CURRENT MEDICATIONS: Current Outpatient Medications (Ophthalmic Drugs)  Medication Sig   Bromfenac Sodium (PROLENSA) 0.07 % SOLN Place 1 drop into the right eye 2 (two) times daily.   dorzolamide-timolol (COSOPT) 22.3-6.8 MG/ML ophthalmic solution Place 1 drop into the right eye daily.   Loteprednol Etabonate (LOTEMAX SM) 0.38 % GEL Place 1 drop into the right eye 2 (two) times daily.   No current facility-administered medications for this visit. (Ophthalmic Drugs)   Current Outpatient Medications (Other)  Medication Sig   apixaban (ELIQUIS) 5 MG TABS tablet Take 1 tablet (5 mg total) by mouth 2 (two) times daily.   atorvastatin (LIPITOR) 80 MG tablet TAKE 1 TABLET BY MOUTH  DAILY (Patient taking differently: Take 40 mg by mouth at bedtime.)   colesevelam (WELCHOL) 625 MG tablet Take 625 mg by mouth 3 (three) times daily.    donepezil (ARICEPT) 10 MG tablet Take 10 mg by mouth every morning.    esomeprazole (NEXIUM)  40 MG capsule Take 40 mg by mouth daily.    ferrous sulfate 325 (65 FE) MG tablet TAKE 1 TABLET BY MOUTH EVERY DAY WITH BREAKFAST   gabapentin (NEURONTIN) 300 MG capsule Take 300 mg by mouth at bedtime.   GEMTESA 75 MG TABS Take 1 tablet by mouth daily.   insulin glargine (LANTUS) 100 UNIT/ML injection Inject 20-40 Units into the skin at bedtime as needed (BGL below 140-150 take 20 units, if 151 or higher take 40 units).   memantine (NAMENDA) 10 MG tablet Take 1 tablet (10 mg total) by mouth 2 (two) times daily. Please call and make overdue appt for further refills. 2nd attempt (Patient taking differently: Take 5 mg by mouth 2 (two) times daily. Please call and make overdue appt for further refills. 2nd attempt)   metoprolol tartrate (LOPRESSOR) 25 MG tablet TAKE 1 TABLET BY MOUTH  DAILY (Patient taking differently: Take 12.5 mg by mouth 2 (two) times daily.)   omega-3 acid ethyl esters (LOVAZA) 1 G capsule Take 2 g by mouth daily.   rOPINIRole (REQUIP) 2 MG tablet Take 3-4 mg by mouth daily as needed (RLS).   spironolactone (ALDACTONE) 25 MG tablet TAKE ONE-HALF TABLET BY  MOUTH DAILY   tamsulosin (FLOMAX) 0.4 MG CAPS capsule Take 0.4 mg by mouth daily after supper.  valsartan-hydrochlorothiazide (DIOVAN-HCT) 80-12.5 MG tablet TAKE 1 TABLET BY MOUTH IN  THE MORNING   vitamin B-12 (CYANOCOBALAMIN) 1000 MCG tablet Take 1,000 mcg by mouth 2 (two) times daily.   nitroGLYCERIN (NITROSTAT) 0.4 MG SL tablet Place 1 tablet (0.4 mg total) under the tongue every 5 (five) minutes as needed for up to 25 days for chest pain.   torsemide (DEMADEX) 20 MG tablet Take 1 tablet (20 mg total) by mouth as directed. 2 times a week on Tuesday and Thursday.  Take additional dose as directed with weight gain of 2 Lbs   No current facility-administered medications for this visit. (Other)   REVIEW OF SYSTEMS: ROS   Positive for: Gastrointestinal, Musculoskeletal, Endocrine, Cardiovascular, Eyes, Respiratory Negative  for: Constitutional, Neurological, Skin, Genitourinary, HENT, Psychiatric, Allergic/Imm, Heme/Lymph Last edited by Thompson Grayer, COT on 11/18/2022  1:09 PM.      ALLERGIES No Known Allergies  PAST MEDICAL HISTORY Past Medical History:  Diagnosis Date   Acute anterior wall MI 03/01/2018   Acute combined systolic and diastolic heart failure 03/15/2018   Burn    Chronic diastolic CHF (congestive heart failure) 09/21/2018   Defect, retina, with detachment    Dementia    Diverticulosis    ED (erectile dysfunction)    First degree heart block    GERD (gastroesophageal reflux disease)    Heart murmur    History of blood transfusion    with open heart surgery   Hypertension    cardiologsit-  dr Jacinto Halim   Hypertensive retinopathy    OU   Iron deficiency anemia    Mixed hyperlipidemia    Morbid obesity    Myocardial infarction    02/28/18   OA (osteoarthritis)    right hip   OSA on CPAP    followed by dr dohmeier, uses CPAP   PAF (paroxysmal atrial fibrillation)    a. on Eliquis   Prostate cancer    dx 03-17-2017 (bx)  Stage T2a, Gleason 4+4, PSA  4.43, vol 32.32--  s/p radiatctive prostate seed implants 07-10-2017 then IMRT and ADT   Retinal detachment    Rheg. 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   LIMA to Diagonal Branch   S/P Maze operation for atrial fibrillation 05/30/2018   Complete bilateral atrial lesion set using bipolar radiofrequency and cryothermy ablation with clipping of LA appendage   Scoliosis    Trigger finger    Type 2 diabetes mellitus    Wears partial dentures    upper and lower   Past Surgical History:  Procedure Laterality Date   AORTIC VALVE REPLACEMENT N/A 05/30/2018   Procedure: AORTIC VALVE REPLACEMENT (AVR) using Inspiris Valve, Size 23;  Surgeon: Purcell Nails, MD;   Location: [facility] OR;  Service: Open Heart Surgery;  Laterality: N/A;   APPENDECTOMY  1988   BILATERAL TOTAL ETHMOIDECTOMY AND SPHENOIDECTOMY  01-14-2009    dr Jenne Pane   [facility]   CARDIOVERSION N/A 08/10/2018   Procedure: CARDIOVERSION;  Surgeon: Yates Decamp, MD;  Location: [facility] ENDOSCOPY;  Service: Cardiovascular;  Laterality: N/A;   CARPAL TUNNEL RELEASE Bilateral 2013   CATARACT EXTRACTION Bilateral    Dr. Elmer Picker   CATARACT EXTRACTION W/ INTRAOCULAR LENS  IMPLANT, BILATERAL  date?   CORONARY ARTERY BYPASS GRAFT N/A 05/30/2018   CORONARY BALLOON ANGIOPLASTY N/A  03/01/2018   Procedure: CORONARY BALLOON ANGIOPLASTY;  Surgeon: Yates Decamp, MD;  Location: [facility] INVASIVE CV LAB;  Service: Cardiovascular;  Laterality: N/A;   CORONARY/GRAFT ACUTE MI REVASCULARIZATION N/A 03/01/2018   Procedure: Coronary/Graft Acute MI Revascularization;  Surgeon: Yates Decamp, MD;  Location: [facility] INVASIVE CV LAB;  Service: Cardiovascular;  Laterality: N/A;   CYSTOSCOPY  07/19/2017   Procedure: CYSTOSCOPY;  Surgeon: Hildred Laser, MD;  Location: [facility];  Service: Urology;;  No seeds found in blader   EYE SURGERY Bilateral    Cat Sx OU.  Dislocated IOL sx OD.  Rheg. RD repair OS.   EYE SURGERY     GAS/FLUID EXCHANGE Right 06/13/2019   Procedure: GAS/FLUID EXCHANGE;  Surgeon: Rennis Chris, MD;  Location: [facility] OR;  Service: Ophthalmology;  Laterality: Right;   LAMINECTOMY WITH POSTERIOR LATERAL ARTHRODESIS LEVEL 2 N/A 02/12/2014   Procedure: LUMBAR TWO-THREE,LUIMBAR THREE-FOUR LAMINECTOMY/FORAMINOTOMY;POSSIBLE POSTEROLATERAL ARTHRODESIS WITH AUTOGRAFT;  Surgeon: Karn Cassis, MD;  Location: [facility] NEURO ORS;  Service: Neurosurgery;  Laterality: N/A;   LEFT HEART CATH AND CORONARY ANGIOGRAPHY N/A 03/01/2018   Procedure: LEFT HEART CATH AND CORONARY ANGIOGRAPHY;  Surgeon: Yates Decamp, MD;  Location: [facility] INVASIVE CV LAB;  Service: Cardiovascular;  Laterality: N/A;   MAZE N/A 05/30/2018   Procedure: MAZE;  Surgeon: Purcell Nails, MD;  Location: [facility] OR;  Service: Open Heart Surgery;  Laterality: N/A;   ORIF RIGHT ANKLE FX'S  05/05/2001   retained hardware   PARS PLANA VITRECTOMY Right 06/13/2019   Procedure: PARS PLANA VITRECTOMY WITH 25 GAUGE WITH LASER AND GAS;  Surgeon: Rennis Chris, MD;  Location: [facility] OR;  Service: Ophthalmology;  Laterality: Right;   PARS PLANA VITRECTOMY Right 09/12/2019   Procedure: Pars Plana Vitrectomy With 25 Gauge;  Surgeon: Rennis Chris, MD;  Location: [facility] OR;  Service: Ophthalmology;  Laterality: Right;   PERFLUORONE INJECTION Right 06/13/2019   Procedure: PERFLUORONE INJECTION;  Surgeon: Rennis Chris, MD;  Location: [facility] OR;  Service: Ophthalmology;  Laterality: Right;   PHOTOCOAGULATION WITH LASER Right 06/13/2019   Procedure: PHOTOCOAGULATION WITH LASER;  Surgeon: Rennis Chris, MD;  Location: [facility] OR;  Service: Ophthalmology;  Laterality: Right;   PLACEMENT AND SUTURE OF SECONDARY INTRAOCULAR LENS Right 09/12/2019   Procedure: PLACEMENT AND SUTURE OF SECONDARY INTRAOCULAR LENS;  Surgeon: Rennis Chris, MD;  Location: [facility] OR;  Service: Ophthalmology;  Laterality: Right;   PROSTATE BIOPSY  03-17-2017   dr Sherryl Barters office   RADIOACTIVE SEED IMPLANT N/A 07/19/2017   Procedure: RADIOACTIVE SEED IMPLANT/BRACHYTHERAPY IMPLANT;  Surgeon: Hildred Laser, MD;  Location: [facility];  Service: Urology;  Laterality: N/A;  69 seeds implanted   REMOVAL RETAINED LENS Right 09/12/2019   Procedure: REMOVAL RETAINED LENS;  Surgeon: Rennis Chris, MD;  Location: [facility] OR;  Service: Ophthalmology;  Laterality: Right;   RETINAL DETACHMENT SURGERY Right 06/13/2019   Dr. Vanessa Barbara   RIGHT/LEFT HEART CATH AND CORONARY/GRAFT ANGIOGRAPHY N/A 10/26/2021   Procedure: RIGHT/LEFT HEART CATH AND CORONARY/GRAFT ANGIOGRAPHY;  Surgeon: Yates Decamp, MD;  Location: [facility] INVASIVE CV LAB;  Service: Cardiovascular;  Laterality: N/A;   SKIN GRAFT     face   SPACE OAR INSTILLATION N/A 07/19/2017   Procedure:  SPACE OAR INSTILLATION;  Surgeon: Hildred Laser, MD;  Location: [facility];  Service: Urology;  Laterality: N/A;   TEE WITHOUT CARDIOVERSION  03/20/2012   Procedure: TRANSESOPHAGEAL ECHOCARDIOGRAM (TEE);  Surgeon: Pamella Pert, MD;  Location: [facility] ENDOSCOPY;  Service: Cardiovascular;  Laterality: N/A;  normal LV; normal EF; normal RV; normal LA w/ left atrial appendage very small, normal function, interatrial septum intact without defect; normal RA; trace MR,TR, & PI; mild AV calcification and senile degeneration w/ mild stenosis, AVA 1.7cm^2;;    TEE WITHOUT CARDIOVERSION N/A 05/30/2018   Procedure: TRANSESOPHAGEAL ECHOCARDIOGRAM (TEE);  Surgeon: Purcell Nails, MD;  Location: [facility] OR;  Service: Open Heart Surgery;  Laterality: N/A;   TOTAL HIP ARTHROPLASTY Right 10/23/2017   Procedure: TOTAL HIP ARTHROPLASTY ANTERIOR APPROACH;  Surgeon: Gean Birchwood, MD;  Location: [facility] OR;  Service: Orthopedics;  Laterality: Right;   TRANSTHORACIC ECHOCARDIOGRAM  01-11-2017   dr Jacinto Halim  (per echo note, no significant change in seveity of AS, no other diagnostic change)   moderate concentric LVH, ef 64%, grade 1 diastolic dysfunction/  moderate LAE/  mild , grade 1 AR w/ moderate AV calcification, mild to moderate restricted AV leaflets w/ moderate AS, AVA 1.16cm^2, peak grandient , mean grandient 70mmHg/  trace MR, mild calcification MV annulus , mild MV leaflet calcification, mild MVS, peak grandient 4.29mmHg, mean grandient 2.56mmHg/ trace TR   FAMILY HISTORY Family History  Problem Relation Age of Onset   Stroke Mother 67   Hypertension Mother    Hypertension Father    Heart disease Father    Heart attack Father 36   Heart murmur Father    Hypertension Sister    Lung disease Brother    Colon cancer Neg Hx    Stomach cancer Neg Hx    Rectal cancer Neg Hx    Pancreatic cancer Neg Hx    SOCIAL HISTORY Social History   Tobacco Use   Smoking status: Former    Packs/day: 0.25     Years: 1.00    Additional pack years: 0.00    Total pack years: 0.25    Types: Cigarettes    Quit date: 02/04/1967    Years since quitting: 55.8   Smokeless tobacco: Never   Tobacco comments:    smoked 1 q2-3 days  Vaping Use   Vaping Use: Never used  Substance Use Topics   Alcohol use: Not Currently    Comment: occasional beer   Drug use: Never       OPHTHALMIC EXAM:  Base Eye Exam     Visual Acuity (Snellen - Linear)       Right Left   Dist [place] 20/40 20/25 +1   Dist ph [place] 20/30 -1          Tonometry (Tonopen, 1:16 PM)       Right Left   Pressure 10 18         Pupils       Pupils Dark Light Shape React APD   Right PERRL 3 2 Round Sluggish None   Left PERRL 3 2 Round Sluggish None         Visual Fields (Counting fingers)       Left Right    Full Full         Extraocular Movement       Right Left    Full, Ortho Full, Ortho         Neuro/Psych     Oriented x3: Yes   Mood/Affect: Normal         Dilation     Both eyes: 1.0% Mydriacyl, 2.5% Phenylephrine @ 1:17 PM           Slit Lamp and Fundus Exam     Slit Lamp  Exam       Right Left   Lids/Lashes mild Meibomian gland dysfunction, mild Telangiectasia Dermatochalasis - upper lid, Meibomian gland dysfunction, Edema, Erythema lower lid, +concretion left palp conj   Conjunctiva/Sclera White and quiet Temporal Pinguecula   Cornea well healed superior cataract wound, 2+Punctate epithelial erosions, Arcus, tear film debris, decreased TBUT Trace Punctate epithelial erosions, trace fine endo pigment, tear film debris   Anterior Chamber Deep, 0.5+fine cell/pigment Deep and quiet   Iris Round and poorly dilated, scattered Transillumination defects 360, +iridodenesis, +pigment deposition, +atrophy Round and dilated   Lens Sutured Akreos IOL well centered 3 piece Posterior chamber intraocular in good position, trace Posterior capsular opacification   Anterior Vitreous post vitrectomy, trace  fine pigment Vitreous syneresis, Posterior vitreous detachment         Fundus Exam       Right Left   Disc 1-2+Pallor, Sharp rim 2+Pallor, Sharp rim, mild PPA/PPP, mild tilt, Compact   C/D Ratio 0.3 0.5   Macula Flat, Blunted foveal reflex, mild RPE mottling and clumping, trace central cystic changes -- improved, +pigment deposition on retinal surface, rare MA, mild ERM Flat, Blunted foveal reflex, Retinal pigment epithelial mottling and clumping, No heme or edema   Vessels attenuated, Tortuous, AV crossing changes attenuated, Tortuous   Periphery attached, good 360 laser in place, rare MA/DBH; ORIGINALLY: bullous superior retinal detachment from 1000-0130, another more shallow detachment lobe from 130-400, lattice and micro tears ST quadrant; Old retinal tear at 0900 with surrounding laser Attached, peripheral laser scars at 1030, No new RT/RD           IMAGING AND PROCEDURES  Imaging and Procedures for @TODAY @  OCT, Retina - OU - Both Eyes       Right Eye Quality was good. Central Foveal Thickness: 296. Progression has improved. Findings include normal foveal contour, no IRF, no SRF (Interval improvement in IRF/cystic changes nasal fovea, trace ERM).   Left Eye Quality was good. Central Foveal Thickness: 268. Progression has been stable. Findings include normal foveal contour, no IRF, no SRF.   Notes *Images captured and stored on drive  Diagnosis / Impression:  OD: Interval improvement in IRF/cystic changes nasal fovea, trace ERM OS: NFP, no IRF/SRF   Clinical management:  See below  Abbreviations: NFP - Normal foveal profile. CME - cystoid macular edema. PED - pigment epithelial detachment. IRF - intraretinal fluid. SRF - subretinal fluid. EZ - ellipsoid zone. ERM - epiretinal membrane. ORA - outer retinal atrophy. ORT - outer retinal tubulation. SRHM - subretinal hyper-reflective material            ASSESSMENT/PLAN:    ICD-10-CM   1. Dislocation of intraocular  lens, sequela  T85.22XS     2. Right retinal detachment  H33.21     3. 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 History of repair of retinal tear by laser photocoagulation  Z98.890     5. Diabetes mellitus type 2 without retinopathy  E11.9     6. Essential hypertension  I10     7. Hypertensive retinopathy of both eyes  H35.033     8. Pseudophakia of both eyes  Z96.1     9. Cystoid macular edema of right eye  H35.351 OCT, Retina - OU - Both Eyes    10. 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 Dislocated IOL OD  - pt with history of partially dislocated 3-piece IOL --  spontaneously dislocated completely into vitreous cavity on 1.23.21  - s/p 25g PPV w/ IOL explantation and secondary sutured IOL OD -- Akreos AO60 lens, 17.5D -- 02.11.2021             - IOL in good position  - 2 superior nylon sutures removed at slit lamp 03.12.21 -- last nylon suture removed at slit lamp, 04.30.21  - corneal edema resolved; PEE improved             - s/p STK OD #1 (07.09.21), #2 (01.26.22), #3 (08.30.22) for CME  - BCVA 20/30 - stable  - OCT Interval improvement in IRF/cystic changes nasal fovea, trace ERM (see #9 below)  - IOP 10  - Ats QID OU              - f/u 4 months --DFE/OCT   2,3. Rhegmatogenous retinal detachment, right eye  - bullous, superior, mac off detachment, onset of foveal involvement 11.11.20 by history  - Bi-lobed superior detachment, superior lobe spanning 1000-0130, nasal lobe 0130-0400  - lattice degeneration with microtears noted at 1030  - s/p PPV/PFC/EL/FAX/14% C3F8 OD, 11.12.20             - intraop: HST at 2 oclock was found; also detachment had progressed to subtotal detachment spanning 9 oclock to 6 oclock (going in clockwise direction); also severe zonular insufficiency with IOL very mobile throughout case -- did not dislocate completely during the case or immediately post op  - doing well             - retina attached and in good  position--good laser in place  4. History of retinal defects s/p laser retinopexy OU with Dr. Ashley Royalty in 2013  - OD laser at 0900  - OS laser at 1030  - stable  5. Diabetes mellitus, type 2 without retinopathy  - A1c was 6.2 in August 2022  - The incidence, risk factors for progression, natural history and treatment options for diabetic retinopathy  were discussed with patient.    - The need for close monitoring of blood glucose, blood pressure, and serum lipids, avoiding cigarette or any type of tobacco, and the need for long term follow up was also discussed with patient.   6,7. Hypertensive retinopathy OU  - discussed importance of tight BP control  - monitor  8. Pseudophakia OU  - s/p CE/IOL OU (Dr. Elmer Picker)  - 3 piece IOL OD was completely displaced into vitreous -- now with sutured Akreos IOL in good position OD -- see above  - OS 3-piece IOL in good position  - monitor  9. CME OD  - post op edema-- recurrent/chronic  - s/p STK OD #1 07.09.21, #2 (01.26.22), #3 (08.24.22)  - OCT OD shows Interval improvement in IRF/cystic changes nasal fovea   - pt and wife report decreased use of Lotemax and Prolensa BID OD, however there was a 3 wk span of no Prolensa -- has been back on for about a week  - cont Lotemax SM BID OD and Prolensa BID OD  - cont Cosopt daily OD  - f/u 4 months DFE, OCT  10. Ocular Hypertension OD  - IOP: 10  - continue cosopt daily   Ophthalmic Meds Ordered this visit:  No orders of the defined types were placed in this encounter.    Return in about 4 months (around 03/20/2023) for f/u CME OD, DFE, OCT.  There are no Patient Instructions on file for this visit.  This  document serves as a record of services personally performed by Karie Chimera, MD, PhD. It was created on their behalf by Berlin Hun COT, an ophthalmic technician. The creation of this record is the provider's dictation and/or activities during the visit.    Electronically signed  by: Berlin Hun COT 11/17/2022 1:40 AM  This document serves as a record of services personally performed by Karie Chimera, MD, PhD. It was created on their behalf by Glee Arvin. Manson Passey, OA an ophthalmic technician. The creation of this record is the provider's dictation and/or activities during the visit.    Electronically signed by: Glee Arvin. Manson Passey, [place] 04.19.2024 1:40 AM  Karie Chimera, M.D., Ph.D. Diseases & Surgery of the Retina and Vitreous [facility] 11/18/2022   I have reviewed the above documentation for accuracy and completeness, and I agree with the above. Karie Chimera, M.D., Ph.D. 11/21/22 1:44 AM   Abbreviations: M myopia (nearsighted); A astigmatism; H hyperopia (farsighted); P presbyopia; Mrx spectacle prescription;  CTL contact lenses; OD right eye; OS left eye; OU both eyes  XT exotropia; ET esotropia; PEK punctate epithelial keratitis; PEE punctate epithelial erosions; DES dry eye syndrome; MGD meibomian gland dysfunction; ATs artificial tears; PFAT's preservative free artificial tears; NSC nuclear sclerotic cataract; PSC posterior subcapsular cataract; ERM epi-retinal membrane; PVD posterior vitreous detachment; RD retinal detachment; DM diabetes mellitus; DR diabetic retinopathy; NPDR non-proliferative diabetic retinopathy; PDR proliferative diabetic retinopathy; CSME clinically significant macular edema; DME diabetic macular edema; dbh dot blot hemorrhages; CWS cotton wool spot; POAG primary open angle glaucoma; C/D cup-to-disc ratio; HVF humphrey visual field; GVF goldmann visual field; OCT optical coherence tomography; IOP intraocular pressure; BRVO Branch retinal vein occlusion; CRVO central retinal vein occlusion; CRAO central retinal artery occlusion; BRAO branch retinal artery occlusion; RT retinal tear; SB scleral buckle; PPV pars plana vitrectomy; VH Vitreous hemorrhage; PRP panretinal laser photocoagulation; IVK intravitreal kenalog; VMT  vitreomacular traction; MH Macular hole;  NVD neovascularization of the disc; NVE neovascularization elsewhere; AREDS age related eye disease study; ARMD age related macular degeneration; POAG primary open angle glaucoma; EBMD epithelial/anterior basement membrane dystrophy; ACIOL anterior chamber intraocular lens; IOL intraocular lens; PCIOL posterior chamber intraocular lens; Phaco/IOL phacoemulsification with intraocular lens placement; PRK photorefractive keratectomy; LASIK laser assisted in situ keratomileusis; HTN hypertension; DM diabetes mellitus; COPD chronic obstructive pulmonary disease

## 2022-11-18 ENCOUNTER — Encounter (INDEPENDENT_AMBULATORY_CARE_PROVIDER_SITE_OTHER): Payer: Self-pay | Admitting: Ophthalmology

## 2022-11-18 ENCOUNTER — Ambulatory Visit (INDEPENDENT_AMBULATORY_CARE_PROVIDER_SITE_OTHER): Payer: Medicare Other | Admitting: Ophthalmology

## 2022-11-18 DIAGNOSIS — H35351 Cystoid macular degeneration, right eye: Secondary | ICD-10-CM | POA: Diagnosis not present

## 2022-11-18 DIAGNOSIS — H40051 Ocular hypertension, right eye: Secondary | ICD-10-CM

## 2022-11-18 DIAGNOSIS — Z9889 Other specified postprocedural states: Secondary | ICD-10-CM | POA: Diagnosis not present

## 2022-11-18 DIAGNOSIS — Z961 Presence of intraocular lens: Secondary | ICD-10-CM

## 2022-11-18 DIAGNOSIS — H35411 Lattice degeneration of retina, right eye: Secondary | ICD-10-CM

## 2022-11-18 DIAGNOSIS — I10 Essential (primary) hypertension: Secondary | ICD-10-CM | POA: Diagnosis not present

## 2022-11-18 DIAGNOSIS — H3321 Serous retinal detachment, right eye: Secondary | ICD-10-CM

## 2022-11-18 DIAGNOSIS — E119 Type 2 diabetes mellitus without complications: Secondary | ICD-10-CM

## 2022-11-18 DIAGNOSIS — H35033 Hypertensive retinopathy, bilateral: Secondary | ICD-10-CM

## 2022-11-18 DIAGNOSIS — T8522XS Displacement of intraocular lens, sequela: Secondary | ICD-10-CM

## 2022-11-21 ENCOUNTER — Encounter (INDEPENDENT_AMBULATORY_CARE_PROVIDER_SITE_OTHER): Payer: Self-pay | Admitting: Ophthalmology

## 2023-01-06 ENCOUNTER — Other Ambulatory Visit (INDEPENDENT_AMBULATORY_CARE_PROVIDER_SITE_OTHER): Payer: Self-pay

## 2023-01-06 MED ORDER — BROMFENAC SODIUM 0.07 % OP SOLN: 1.0000 [drp] | Freq: Two times a day (BID) | OPHTHALMIC | 6 refills | Status: DC

## 2023-02-16 ENCOUNTER — Other Ambulatory Visit: Payer: Self-pay | Admitting: Oncology

## 2023-02-16 DIAGNOSIS — D649 Anemia, unspecified: Secondary | ICD-10-CM

## 2023-02-16 NOTE — Progress Notes (Deleted)
[facility] Cancer Initial Visit:  Patient Care Team: Adrian Prince, MD as PCP - General (Endocrinology)  CHIEF COMPLAINTS/PURPOSE OF CONSULTATION:  Oncology History   No history exists.    HISTORY OF PRESENTING ILLNESS: Cody Phelps 82 y.o. male is here because of  ***  Review of Systems - Oncology  MEDICAL HISTORY: Past Medical History:  Diagnosis Date   Acute anterior wall MI ([name]) 03/01/2018   Acute combined systolic and diastolic heart failure ([name]) 03/15/2018   Burn    Chronic diastolic CHF (congestive heart failure) ([name]) 09/21/2018   Defect, retina, with detachment    Dementia ([name])    Diverticulosis    ED (erectile dysfunction)    First degree heart block    GERD (gastroesophageal reflux disease)    Heart murmur    History of blood transfusion    with open heart surgery   Hypertension    cardiologsit-  dr Jacinto Halim   Hypertensive retinopathy    OU   Iron deficiency anemia    Mixed hyperlipidemia    Morbid obesity ([name])    Myocardial infarction ([name])    02/28/18   OA (osteoarthritis)    right hip   OSA on CPAP    followed by dr dohmeier, uses CPAP   PAF (paroxysmal atrial fibrillation) ([name])    a. on Eliquis   Prostate cancer ([name])    dx 03-17-2017 (bx)  Stage T2a, Gleason 4+4, PSA  4.43, vol 32.32--  s/p radiatctive prostate seed implants 07-10-2017 then IMRT and ADT   Retinal detachment    Rheg. 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   LIMA to Diagonal Branch   S/P Maze operation for atrial fibrillation 05/30/2018   Complete bilateral atrial lesion set using bipolar radiofrequency and cryothermy ablation with clipping of LA appendage   Scoliosis    Trigger finger    Type 2 diabetes mellitus ([name])    Wears partial dentures    upper and lower    SURGICAL HISTORY: Past  Surgical History:  Procedure Laterality Date   AORTIC VALVE REPLACEMENT N/A 05/30/2018   Procedure: AORTIC VALVE REPLACEMENT (AVR) using Inspiris Valve, Size 23;  Surgeon: Purcell Nails, MD;  Location: [facility] OR;  Service: Open Heart Surgery;  Laterality: N/A;   APPENDECTOMY  1988   BILATERAL TOTAL ETHMOIDECTOMY AND SPHENOIDECTOMY  01-14-2009    dr Jenne Pane   [facility]   CARDIOVERSION N/A 08/10/2018   Procedure: CARDIOVERSION;  Surgeon: Yates Decamp, MD;  Location: [facility] ENDOSCOPY;  Service: Cardiovascular;  Laterality: N/A;   CARPAL TUNNEL RELEASE Bilateral 2013   CATARACT EXTRACTION Bilateral    Dr. Elmer Picker   CATARACT EXTRACTION W/ INTRAOCULAR LENS  IMPLANT, BILATERAL  date?   CORONARY ARTERY BYPASS GRAFT N/A 05/30/2018   CORONARY BALLOON ANGIOPLASTY N/A 03/01/2018   Procedure: CORONARY BALLOON ANGIOPLASTY;  Surgeon: Yates Decamp, MD;  Location: [facility] INVASIVE CV LAB;  Service: Cardiovascular;  Laterality: N/A;   CORONARY/GRAFT ACUTE MI REVASCULARIZATION N/A 03/01/2018   Procedure: Coronary/Graft Acute MI Revascularization;  Surgeon: Yates Decamp, MD;  Location: [facility] INVASIVE CV LAB;  Service: Cardiovascular;  Laterality: N/A;   CYSTOSCOPY  07/19/2017   Procedure: CYSTOSCOPY;  Surgeon: Hildred Laser, MD;  Location: [facility];  Service: Urology;;  No seeds found in blader   EYE  SURGERY Bilateral    Cat Sx OU.  Dislocated IOL sx OD.  Rheg. RD repair OS.   EYE SURGERY     GAS/FLUID EXCHANGE Right 06/13/2019   Procedure: GAS/FLUID EXCHANGE;  Surgeon: Rennis Chris, MD;  Location: [facility] OR;  Service: Ophthalmology;  Laterality: Right;   LAMINECTOMY WITH POSTERIOR LATERAL ARTHRODESIS LEVEL 2 N/A 02/12/2014   Procedure: LUMBAR TWO-THREE,LUIMBAR THREE-FOUR LAMINECTOMY/FORAMINOTOMY;POSSIBLE POSTEROLATERAL ARTHRODESIS WITH AUTOGRAFT;  Surgeon: Karn Cassis, MD;  Location: [facility] NEURO ORS;  Service: Neurosurgery;  Laterality: N/A;   LEFT HEART CATH AND CORONARY ANGIOGRAPHY N/A 03/01/2018   Procedure: LEFT  HEART CATH AND CORONARY ANGIOGRAPHY;  Surgeon: Yates Decamp, MD;  Location: [facility] INVASIVE CV LAB;  Service: Cardiovascular;  Laterality: N/A;   MAZE N/A 05/30/2018   Procedure: MAZE;  Surgeon: Purcell Nails, MD;  Location: [facility] OR;  Service: Open Heart Surgery;  Laterality: N/A;   ORIF RIGHT ANKLE FX'S  05/05/2001   retained hardware   PARS PLANA VITRECTOMY Right 06/13/2019   Procedure: PARS PLANA VITRECTOMY WITH 25 GAUGE WITH LASER AND GAS;  Surgeon: Rennis Chris, MD;  Location: [facility] OR;  Service: Ophthalmology;  Laterality: Right;   PARS PLANA VITRECTOMY Right 09/12/2019   Procedure: Pars Plana Vitrectomy With 25 Gauge;  Surgeon: Rennis Chris, MD;  Location: [facility] OR;  Service: Ophthalmology;  Laterality: Right;   PERFLUORONE INJECTION Right 06/13/2019   Procedure: PERFLUORONE INJECTION;  Surgeon: Rennis Chris, MD;  Location[facility] OR;  Service: Ophthalmology;  Laterality: Right;   PHOTOCOAGULATION WITH LASER Right 06/13/2019   Procedure: PHOTOCOAGULATION WITH LASER;  Surgeon: Rennis Chris, MD;  Location: [facility] OR;  Service: Ophthalmology;  Laterality: Right;   PLACEMENT AND SUTURE OF SECONDARY INTRAOCULAR LENS Right 09/12/2019   Procedure: PLACEMENT AND SUTURE OF SECONDARY INTRAOCULAR LENS;  Surgeon: Rennis Chris, MD;  Location: [facility] OR;  Service: Ophthalmology;  Laterality: Right;   PROSTATE BIOPSY  03-17-2017   dr Sherryl Barters office   RADIOACTIVE SEED IMPLANT N/A 07/19/2017   Procedure: RADIOACTIVE SEED IMPLANT/BRACHYTHERAPY IMPLANT;  Surgeon: Hildred Laser, MD;  Location: [facility];  Service: Urology;  Laterality: N/A;  69 seeds implanted   REMOVAL RETAINED LENS Right 09/12/2019   Procedure: REMOVAL RETAINED LENS;  Surgeon: Rennis Chris, MD;  Location: [facility] OR;  Service: Ophthalmology;  Laterality: Right;   RETINAL DETACHMENT SURGERY Right 06/13/2019   Dr. Vanessa Barbara   RIGHT/LEFT HEART CATH AND CORONARY/GRAFT ANGIOGRAPHY N/A 10/26/2021   Procedure: RIGHT/LEFT HEART CATH AND CORONARY/GRAFT  ANGIOGRAPHY;  Surgeon: Yates Decamp, MD;  Location: [facility] INVASIVE CV LAB;  Service: Cardiovascular;  Laterality: N/A;   SKIN GRAFT     face   SPACE OAR INSTILLATION N/A 07/19/2017   Procedure: SPACE OAR INSTILLATION;  Surgeon: Hildred Laser, MD;  Location: [facility];  Service: Urology;  Laterality: N/A;   TEE WITHOUT CARDIOVERSION  03/20/2012   Procedure: TRANSESOPHAGEAL ECHOCARDIOGRAM (TEE);  Surgeon: Pamella Pert, MD;  Location: [facility] ENDOSCOPY;  Service: Cardiovascular;  Laterality: N/A;  normal LV; normal EF; normal RV; normal LA w/ left atrial appendage very small, normal function, interatrial septum intact without defect; normal RA; trace MR,TR, & PI; mild AV calcification and senile degeneration w/ mild stenosis, AVA 1.7cm^2;;    TEE WITHOUT CARDIOVERSION N/A 05/30/2018   Procedure: TRANSESOPHAGEAL ECHOCARDIOGRAM (TEE);  Surgeon: Purcell Nails, MD;  Location[facility] OR;  Service: Open Heart Surgery;  Laterality: N/A;   TOTAL HIP ARTHROPLASTY Right 10/23/2017   Procedure: TOTAL HIP ARTHROPLASTY ANTERIOR APPROACH;  Surgeon: Gean Birchwood, MD;  Location: [facility] OR;  Service: Orthopedics;  Laterality: Right;   TRANSTHORACIC ECHOCARDIOGRAM  01-11-2017   dr Jacinto Halim  (per echo note, no significant change in seveity of AS, no other diagnostic change)   moderate concentric LVH, ef 64%, grade 1 diastolic dysfunction/  moderate LAE/  mild , grade 1 AR w/ moderate AV calcification, mild to moderate restricted AV leaflets w/ moderate AS, AVA 1.16cm^2, peak grandient , mean grandient 67mmHg/  trace MR, mild calcification MV annulus , mild MV leaflet calcification, mild MVS, peak grandient 4.87mmHg, mean grandient 2.81mmHg/ trace TR    SOCIAL HISTORY: Social History   Socioeconomic History   Marital status: Married    Spouse name: Lupita Leash   Number of children: 2   Years of education: 14   Highest education level: Not on file  Occupational History   Not on file  Tobacco Use   Smoking  status: Former    Current packs/day: 0.00    Average packs/day: 0.3 packs/day for 1 year (0.3 ttl pk-yrs)    Types: Cigarettes    Start date: 02/03/1966    Quit date: 02/04/1967    Years since quitting: 56.0   Smokeless tobacco: Never   Tobacco comments:    smoked 1 q2-3 days  Vaping Use   Vaping status: Never Used  Substance and Sexual Activity   Alcohol use: Not Currently    Comment: occasional beer   Drug use: Never   Sexual activity: Yes  Other Topics Concern   Not on file  Social History Narrative   Patient is married Lupita Leash).   Patient has two children.   Patient is retired.   Patient has a college education.   Patient is right-handed.   Patient drinks two cups of coffee per day and 2-3 cups of Diet soda daily and limited tea.         Social Determinants of Health   Financial Resource Strain: Not on file  Food Insecurity: Not on file  Transportation Needs: Not on file  Physical Activity: Not on file  Stress: Not on file  Social Connections: Not on file  Intimate Partner Violence: Not on file    FAMILY HISTORY Family History  Problem Relation Age of Onset   Stroke Mother 32   Hypertension Mother    Hypertension Father    Heart disease Father    Heart attack Father 28   Heart murmur Father    Hypertension Sister    Lung disease Brother    Colon cancer Neg Hx    Stomach cancer Neg Hx    Rectal cancer Neg Hx    Pancreatic cancer Neg Hx     ALLERGIES:  has No Known Allergies.  MEDICATIONS:  Current Outpatient Medications  Medication Sig Dispense Refill   apixaban (ELIQUIS) 5 MG TABS tablet Take 1 tablet (5 mg total) by mouth 2 (two) times daily. 180 tablet 3   atorvastatin (LIPITOR) 80 MG tablet TAKE 1 TABLET BY MOUTH  DAILY (Patient taking differently: Take 40 mg by mouth at bedtime.) 90 tablet 3   Bromfenac Sodium (PROLENSA) 0.07 % SOLN Place 1 drop into the right eye 2 (two) times daily. 6 mL 6   colesevelam (WELCHOL) 625 MG tablet Take 625 mg by mouth  3 (three) times daily.      donepezil (ARICEPT) 10 MG tablet Take 10 mg by mouth every morning.      dorzolamide-timolol (COSOPT) 22.3-6.8 MG/ML ophthalmic solution Place 1 drop into  the right eye daily. 10 mL 6   esomeprazole (NEXIUM) 40 MG capsule Take 40 mg by mouth daily.      ferrous sulfate 325 (65 FE) MG tablet TAKE 1 TABLET BY MOUTH EVERY DAY WITH BREAKFAST 90 tablet 3   gabapentin (NEURONTIN) 300 MG capsule Take 300 mg by mouth at bedtime.     GEMTESA 75 MG TABS Take 1 tablet by mouth daily.     insulin glargine (LANTUS) 100 UNIT/ML injection Inject 20-40 Units into the skin at bedtime as needed (BGL below 140-150 take 20 units, if 151 or higher take 40 units).     Loteprednol Etabonate (LOTEMAX SM) 0.38 % GEL Place 1 drop into the right eye 2 (two) times daily. 5 g 6   memantine (NAMENDA) 10 MG tablet Take 1 tablet (10 mg total) by mouth 2 (two) times daily. Please call and make overdue appt for further refills. 2nd attempt (Patient taking differently: Take 5 mg by mouth 2 (two) times daily. Please call and make overdue appt for further refills. 2nd attempt) 30 tablet 0   metoprolol tartrate (LOPRESSOR) 25 MG tablet TAKE 1 TABLET BY MOUTH  DAILY (Patient taking differently: Take 12.5 mg by mouth 2 (two) times daily.) 90 tablet 3   nitroGLYCERIN (NITROSTAT) 0.4 MG SL tablet Place 1 tablet (0.4 mg total) under the tongue every 5 (five) minutes as needed for up to 25 days for chest pain. 25 tablet 3   omega-3 acid ethyl esters (LOVAZA) 1 G capsule Take 2 g by mouth daily.     rOPINIRole (REQUIP) 2 MG tablet Take 3-4 mg by mouth daily as needed (RLS).     spironolactone (ALDACTONE) 25 MG tablet TAKE ONE-HALF TABLET BY  MOUTH DAILY 45 tablet 3   tamsulosin (FLOMAX) 0.4 MG CAPS capsule Take 0.4 mg by mouth daily after supper.      torsemide (DEMADEX) 20 MG tablet Take 1 tablet (20 mg total) by mouth as directed. 2 times a week on Tuesday and Thursday.  Take additional dose as directed with weight  gain of 2 Lbs 90 tablet 1   valsartan-hydrochlorothiazide (DIOVAN-HCT) 80-12.5 MG tablet TAKE 1 TABLET BY MOUTH IN  THE MORNING 90 tablet 3   vitamin B-12 (CYANOCOBALAMIN) 1000 MCG tablet Take 1,000 mcg by mouth 2 (two) times daily.     No current facility-administered medications for this visit.    PHYSICAL EXAMINATION:  ECOG PERFORMANCE STATUS: {CHL ONC ECOG PS:[id number]}   There were no vitals filed for this visit.  There were no vitals filed for this visit.   Physical Exam   LABORATORY DATA: I have personally reviewed the data as listed:  No visits with results within 1 Month(s) from this visit.  Latest known visit with results is:  Appointment on 08/17/2022  Component Date Value Ref Range Status   Iron 08/17/2022 76  45 - 182 ug/dL Final   TIBC 40/05/2724 308  250 - 450 ug/dL Final   Saturation Ratios 08/17/2022 25  17.9 - 39.5 % Final   UIBC 08/17/2022 232  117 - 376 ug/dL Final   Performed at [facility], [address], [place], [place] [address]   Vitamin B-12 08/17/2022 289  180 - 914 pg/mL Final   Comment: (NOTE) This assay is not validated for testing neonatal or myeloproliferative syndrome specimens for Vitamin B12 levels. Performed at [facility], [address] [place], [place] [address]    Ferritin 08/17/2022 162  24 - 336 ng/mL Final   Performed at [facility], [address] [place], [place] [address]   WBC Count 08/17/2022 4.8  4.0 - 10.5 K/uL Final   RBC 08/17/2022 3.64 (L)  4.22 - 5.81 MIL/uL Final   Hemoglobin 08/17/2022 10.9 (L)  13.0 - 17.0 g/dL Final   HCT 62/13/0865 30.6 (L)  39.0 - 52.0 % Final   MCV 08/17/2022 84.1  80.0 - 100.0 fL Final   MCH 08/17/2022 29.9  26.0 - 34.0 pg Final   MCHC 08/17/2022 35.6  30.0 - 36.0 g/dL Final   RDW 78/46/9629 13.3  11.5 - 15.5 % Final   Platelet Count 08/17/2022 176  150 - 400 K/uL Final   nRBC 08/17/2022 0.0  0.0 - 0.2 % Final    Neutrophils Relative % 08/17/2022 62  % Final   Neutro Abs 08/17/2022 3.0  1.7 - 7.7 K/uL Final   Lymphocytes Relative 08/17/2022 23  % Final   Lymphs Abs 08/17/2022 1.1  0.7 - 4.0 K/uL Final   Monocytes Relative 08/17/2022 9  % Final   Monocytes Absolute 08/17/2022 0.5  0.1 - 1.0 K/uL Final   Eosinophils Relative 08/17/2022 4  % Final   Eosinophils Absolute 08/17/2022 0.2  0.0 - 0.5 K/uL Final   Basophils Relative 08/17/2022 1  % Final   Basophils Absolute 08/17/2022 0.1  0.0 - 0.1 K/uL Final   Immature Granulocytes 08/17/2022 1  % Final   Abs Immature Granulocytes 08/17/2022 0.03  0.00 - 0.07 K/uL Final   Performed at [facility], [address], [place], [place] [address]    RADIOGRAPHIC STUDIES: I have personally reviewed the radiological images as listed and agree with the findings in the report  No results found.  ASSESSMENT/PLAN Cancer Staging  No matching staging information was found for the patient.    No problem-specific Assessment & Plan notes found for this encounter.    No orders of the defined types were placed in this encounter.     minutes was spent in patient care.  This included time spent preparing to see the patient (e.g., review of tests), obtaining and/or reviewing separately obtained history, counseling and educating the patient/family/caregiver, ordering medications, tests, or procedures; documenting clinical information in the electronic or other health record, independently interpreting results and communicating results to the patient/family/caregiver as well as coordination of care.       All questions were answered. The patient knows to call the clinic with any problems, questions or concerns.  This note was electronically signed.    Cody Muse, MD  02/16/2023 2:29 PM

## 2023-02-17 ENCOUNTER — Inpatient Hospital Stay: Payer: Medicare Other | Admitting: Oncology

## 2023-02-17 ENCOUNTER — Inpatient Hospital Stay: Payer: Medicare Other | Attending: Oncology

## 2023-02-20 ENCOUNTER — Telehealth: Payer: Self-pay | Admitting: Oncology

## 2023-02-20 NOTE — Telephone Encounter (Signed)
Patient wife did not want to reschedule appointment

## 2023-03-15 NOTE — Progress Notes (Signed)
[facility] - Clinic Note  03/17/2023     CHIEF COMPLAINT Patient presents for Retina Follow Up  HISTORY OF PRESENT ILLNESS: Cody Phelps is a 82 y.o. male who presents to the clinic today for:  HPI     Retina Follow Up   Patient presents with  Other.  In right eye.  Severity is moderate.  Duration of 4 months.  Since onset it is stable.  I, the attending physician,  performed the HPI with the patient and updated documentation appropriately.        Comments   Pt here for 4 mo ret f/u CME OD. Pt states VA is stable, no changes noted. Still on lotemax BID OD, Prolensa BID OD, Cosopt every day OD.       Last edited by Rennis Chris, MD on 03/17/2023  1:18 PM.    Pt states no change in vision, he is using lotemax and prolensa BID OD and Cosopt qdaily OD   Referring physician: Adrian Prince, MD [address] [place],  [place] [address]  HISTORICAL INFORMATION:   Selected notes from the MEDICAL RECORD NUMBER Referred by Dr. [place] DeMarco for concern of RD OD   CURRENT MEDICATIONS: Current Outpatient Medications (Ophthalmic Drugs)  Medication Sig   Bromfenac Sodium (PROLENSA) 0.07 % SOLN Place 1 drop into the right eye 2 (two) times daily.   Bromfenac Sodium 0.09 % SOLN Place 1 drop into the right eye 2 (two) times daily.   dorzolamide-timolol (COSOPT) 22.3-6.8 MG/ML ophthalmic solution Place 1 drop into the right eye daily.   Loteprednol Etabonate (LOTEMAX SM) 0.38 % GEL Place 1 drop into the right eye 2 (two) times daily.   No current facility-administered medications for this visit. (Ophthalmic Drugs)   Current Outpatient Medications (Other)  Medication Sig   apixaban (ELIQUIS) 5 MG TABS tablet Take 1 tablet (5 mg total) by mouth 2 (two) times daily.   atorvastatin (LIPITOR) 80 MG tablet TAKE 1 TABLET BY MOUTH  DAILY (Patient taking differently: Take 40 mg by mouth at bedtime.)   colesevelam (WELCHOL) 625 MG tablet Take 625 mg by mouth 3 (three)  times daily.    donepezil (ARICEPT) 10 MG tablet Take 10 mg by mouth every morning.    esomeprazole (NEXIUM) 40 MG capsule Take 40 mg by mouth daily.    ferrous sulfate 325 (65 FE) MG tablet TAKE 1 TABLET BY MOUTH EVERY DAY WITH BREAKFAST   gabapentin (NEURONTIN) 300 MG capsule Take 300 mg by mouth at bedtime.   GEMTESA 75 MG TABS Take 1 tablet by mouth daily.   insulin glargine (LANTUS) 100 UNIT/ML injection Inject 20-40 Units into the skin at bedtime as needed (BGL below 140-150 take 20 units, if 151 or higher take 40 units).   memantine (NAMENDA) 10 MG tablet Take 1 tablet (10 mg total) by mouth 2 (two) times daily. Please call and make overdue appt for further refills. 2nd attempt (Patient taking differently: Take 5 mg by mouth 2 (two) times daily. Please call and make overdue appt for further refills. 2nd attempt)   metoprolol tartrate (LOPRESSOR) 25 MG tablet TAKE 1 TABLET BY MOUTH  DAILY (Patient taking differently: Take 12.5 mg by mouth 2 (two) times daily.)   omega-3 acid ethyl esters (LOVAZA) 1 G capsule Take 2 g by mouth daily.   rOPINIRole (REQUIP) 2 MG tablet Take 3-4 mg by mouth daily as needed (RLS).   spironolactone (ALDACTONE) 25 MG tablet TAKE ONE-HALF TABLET  BY  MOUTH DAILY   tamsulosin (FLOMAX) 0.4 MG CAPS capsule Take 0.4 mg by mouth daily after supper.    valsartan-hydrochlorothiazide (DIOVAN-HCT) 80-12.5 MG tablet TAKE 1 TABLET BY MOUTH IN  THE MORNING   vitamin B-12 (CYANOCOBALAMIN) 1000 MCG tablet Take 1,000 mcg by mouth 2 (two) times daily.   nitroGLYCERIN (NITROSTAT) 0.4 MG SL tablet Place 1 tablet (0.4 mg total) under the tongue every 5 (five) minutes as needed for up to 25 days for chest pain.   torsemide (DEMADEX) 20 MG tablet Take 1 tablet (20 mg total) by mouth as directed. 2 times a week on Tuesday and Thursday.  Take additional dose as directed with weight gain of 2 Lbs   No current facility-administered medications for this visit. (Other)   REVIEW OF  SYSTEMS: ROS   Positive for: Gastrointestinal, Musculoskeletal, Endocrine, Cardiovascular, Eyes, Respiratory Negative for: Constitutional, Neurological, Skin, Genitourinary, HENT, Psychiatric, Allergic/Imm, Heme/Lymph Last edited by Thompson Grayer, COT on 03/17/2023 12:48 PM.       ALLERGIES No Known Allergies  PAST MEDICAL HISTORY Past Medical History:  Diagnosis Date   Acute anterior wall MI ([name]) 03/01/2018   Acute combined systolic and diastolic heart failure ([name]) 03/15/2018   Burn    Chronic diastolic CHF (congestive heart failure) ([name]) 09/21/2018   Defect, retina, with detachment    Dementia ([name])    Diverticulosis    ED (erectile dysfunction)    First degree heart block    GERD (gastroesophageal reflux disease)    Heart murmur    History of blood transfusion    with open heart surgery   Hypertension    cardiologsit-  dr Jacinto Halim   Hypertensive retinopathy    OU   Iron deficiency anemia    Mixed hyperlipidemia    Morbid obesity ([name])    Myocardial infarction ([name])    02/28/18   OA (osteoarthritis)    right hip   OSA on CPAP    followed by dr dohmeier, uses CPAP   PAF (paroxysmal atrial fibrillation) ([name])    a. on Eliquis   Prostate cancer ([name])    dx 03-17-2017 (bx)  Stage T2a, Gleason 4+4, PSA  4.43, vol 32.32--  s/p radiatctive prostate seed implants 07-10-2017 then IMRT and ADT   Retinal detachment    Rheg. 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   LIMA to Diagonal Branch   S/P Maze operation for atrial fibrillation 05/30/2018   Complete bilateral atrial lesion set using bipolar radiofrequency and cryothermy ablation with clipping of LA appendage   Scoliosis    Trigger finger    Type 2 diabetes mellitus ([name])    Wears partial dentures    upper and lower   Past Surgical History:   Procedure Laterality Date   AORTIC VALVE REPLACEMENT N/A 05/30/2018   Procedure: AORTIC VALVE REPLACEMENT (AVR) using Inspiris Valve, Size 23;  Surgeon: Purcell Nails, MD;  Location: [facility] OR;  Service: Open Heart Surgery;  Laterality: N/A;   APPENDECTOMY  1988   BILATERAL TOTAL ETHMOIDECTOMY AND SPHENOIDECTOMY  01-14-2009    dr Jenne Pane   [facility]   CARDIOVERSION N/A 08/10/2018   Procedure: CARDIOVERSION;  Surgeon: Yates Decamp, MD;  Location: [facility] ENDOSCOPY;  Service: Cardiovascular;  Laterality: N/A;   CARPAL TUNNEL RELEASE Bilateral 2013   CATARACT EXTRACTION  Bilateral    Dr. Elmer Picker   CATARACT EXTRACTION W/ INTRAOCULAR LENS  IMPLANT, BILATERAL  date?   CORONARY ARTERY BYPASS GRAFT N/A 05/30/2018   CORONARY BALLOON ANGIOPLASTY N/A 03/01/2018   Procedure: CORONARY BALLOON ANGIOPLASTY;  Surgeon: Yates Decamp, MD;  Location: [facility] INVASIVE CV LAB;  Service: Cardiovascular;  Laterality: N/A;   CORONARY/GRAFT ACUTE MI REVASCULARIZATION N/A 03/01/2018   Procedure: Coronary/Graft Acute MI Revascularization;  Surgeon: Yates Decamp, MD;  Location: [facility] INVASIVE CV LAB;  Service: Cardiovascular;  Laterality: N/A;   CYSTOSCOPY  07/19/2017   Procedure: CYSTOSCOPY;  Surgeon: Hildred Laser, MD;  Location: [facility];  Service: Urology;;  No seeds found in blader   EYE SURGERY Bilateral    Cat Sx OU.  Dislocated IOL sx OD.  Rheg. RD repair OS.   EYE SURGERY     GAS/FLUID EXCHANGE Right 06/13/2019   Procedure: GAS/FLUID EXCHANGE;  Surgeon: Rennis Chris, MD;  Location: [facility] OR;  Service: Ophthalmology;  Laterality: Right;   LAMINECTOMY WITH POSTERIOR LATERAL ARTHRODESIS LEVEL 2 N/A 02/12/2014   Procedure: LUMBAR TWO-THREE,LUIMBAR THREE-FOUR LAMINECTOMY/FORAMINOTOMY;POSSIBLE POSTEROLATERAL ARTHRODESIS WITH AUTOGRAFT;  Surgeon: Karn Cassis, MD;  Location: [facility] NEURO ORS;  Service: Neurosurgery;  Laterality: N/A;   LEFT HEART CATH AND CORONARY ANGIOGRAPHY N/A 03/01/2018   Procedure: LEFT HEART CATH AND  CORONARY ANGIOGRAPHY;  Surgeon: Yates Decamp, MD;  Location: [facility] INVASIVE CV LAB;  Service: Cardiovascular;  Laterality: N/A;   MAZE N/A 05/30/2018   Procedure: MAZE;  Surgeon: Purcell Nails, MD;  Location: [facility] OR;  Service: Open Heart Surgery;  Laterality: N/A;   ORIF RIGHT ANKLE FX'S  05/05/2001   retained hardware   PARS PLANA VITRECTOMY Right 06/13/2019   Procedure: PARS PLANA VITRECTOMY WITH 25 GAUGE WITH LASER AND GAS;  Surgeon: Rennis Chris, MD;  Location: [facility] OR;  Service: Ophthalmology;  Laterality: Right;   PARS PLANA VITRECTOMY Right 09/12/2019   Procedure: Pars Plana Vitrectomy With 25 Gauge;  Surgeon: Rennis Chris, MD;  Location: [facility] OR;  Service: Ophthalmology;  Laterality: Right;   PERFLUORONE INJECTION Right 06/13/2019   Procedure: PERFLUORONE INJECTION;  Surgeon: Rennis Chris, MD;  Location: [facility] OR;  Service: Ophthalmology;  Laterality: Right;   PHOTOCOAGULATION WITH LASER Right 06/13/2019   Procedure: PHOTOCOAGULATION WITH LASER;  Surgeon: Rennis Chris, MD;  Location: [facility] OR;  Service: Ophthalmology;  Laterality: Right;   PLACEMENT AND SUTURE OF SECONDARY INTRAOCULAR LENS Right 09/12/2019   Procedure: PLACEMENT AND SUTURE OF SECONDARY INTRAOCULAR LENS;  Surgeon: Rennis Chris, MD;  Location: [facility] OR;  Service: Ophthalmology;  Laterality: Right;   PROSTATE BIOPSY  03-17-2017   dr Sherryl Barters office   RADIOACTIVE SEED IMPLANT N/A 07/19/2017   Procedure: RADIOACTIVE SEED IMPLANT/BRACHYTHERAPY IMPLANT;  Surgeon: Hildred Laser, MD;  Location: [facility];  Service: Urology;  Laterality: N/A;  69 seeds implanted   REMOVAL RETAINED LENS Right 09/12/2019   Procedure: REMOVAL RETAINED LENS;  Surgeon: Rennis Chris, MD;  Location: [facility] OR;  Service: Ophthalmology;  Laterality: Right;   RETINAL DETACHMENT SURGERY Right 06/13/2019   Dr. Vanessa Barbara   RIGHT/LEFT HEART CATH AND CORONARY/GRAFT ANGIOGRAPHY N/A 10/26/2021   Procedure: RIGHT/LEFT HEART CATH AND CORONARY/GRAFT ANGIOGRAPHY;   Surgeon: Yates Decamp, MD;  Location: [facility] INVASIVE CV LAB;  Service: Cardiovascular;  Laterality: N/A;   SKIN GRAFT     face   SPACE OAR INSTILLATION N/A 07/19/2017   Procedure: SPACE OAR INSTILLATION;  Surgeon: Hildred Laser, MD;  Location: [facility];  Service: Urology;  Laterality: N/A;   TEE WITHOUT CARDIOVERSION  03/20/2012   Procedure: TRANSESOPHAGEAL ECHOCARDIOGRAM (TEE);  Surgeon: Pamella Pert, MD;  Location: [facility] ENDOSCOPY;  Service: Cardiovascular;  Laterality: N/A;  normal LV; normal EF; normal RV; normal LA w/ left atrial appendage very small, normal function, interatrial septum intact without defect; normal RA; trace MR,TR, & PI; mild AV calcification and senile degeneration w/ mild stenosis, AVA 1.7cm^2;;    TEE WITHOUT CARDIOVERSION N/A 05/30/2018   Procedure: TRANSESOPHAGEAL ECHOCARDIOGRAM (TEE);  Surgeon: Purcell Nails, MD;  Location: [facility] OR;  Service: Open Heart Surgery;  Laterality: N/A;   TOTAL HIP ARTHROPLASTY Right 10/23/2017   Procedure: TOTAL HIP ARTHROPLASTY ANTERIOR APPROACH;  Surgeon: Gean Birchwood, MD;  Location: [facility] OR;  Service: Orthopedics;  Laterality: Right;   TRANSTHORACIC ECHOCARDIOGRAM  01-11-2017   dr Jacinto Halim  (per echo note, no significant change in seveity of AS, no other diagnostic change)   moderate concentric LVH, ef 64%, grade 1 diastolic dysfunction/  moderate LAE/  mild , grade 1 AR w/ moderate AV calcification, mild to moderate restricted AV leaflets w/ moderate AS, AVA 1.16cm^2, peak grandient , mean grandient 19mmHg/  trace MR, mild calcification MV annulus , mild MV leaflet calcification, mild MVS, peak grandient 4.19mmHg, mean grandient 2.53mmHg/ trace TR   FAMILY HISTORY Family History  Problem Relation Age of Onset   Stroke Mother 48   Hypertension Mother    Hypertension Father    Heart disease Father    Heart attack Father 46   Heart murmur Father    Hypertension Sister    Lung disease Brother    Colon cancer Neg Hx     Stomach cancer Neg Hx    Rectal cancer Neg Hx    Pancreatic cancer Neg Hx    SOCIAL HISTORY Social History   Tobacco Use   Smoking status: Former    Current packs/day: 0.00    Average packs/day: 0.3 packs/day for 1 year (0.3 ttl pk-yrs)    Types: Cigarettes    Start date: 02/03/1966    Quit date: 02/04/1967    Years since quitting: 56.1   Smokeless tobacco: Never   Tobacco comments:    smoked 1 q2-3 days  Vaping Use   Vaping status: Never Used  Substance Use Topics   Alcohol use: Not Currently    Comment: occasional beer   Drug use: Never       OPHTHALMIC EXAM:  Base Eye Exam     Visual Acuity (Snellen - Linear)       Right Left   Dist [place] 20/40 +2 20/25   Dist ph [place] 20/30 -1          Tonometry (Tonopen, 12:54 PM)       Right Left   Pressure 18 17         Pupils       Pupils Dark Light Shape React APD   Right PERRL 3 2 Round Sluggish None   Left PERRL 3 2 Round Sluggish None         Visual Fields (Counting fingers)       Left Right    Full Full         Extraocular Movement       Right Left    Full, Ortho Full, Ortho         Neuro/Psych     Oriented x3: Yes   Mood/Affect: Normal         Dilation  Both eyes: 1.0% Mydriacyl, 2.5% Phenylephrine @ 12:55 PM           Slit Lamp and Fundus Exam     Slit Lamp Exam       Right Left   Lids/Lashes Dermatochalasis - upper lid, Meibomian gland dysfunction Dermatochalasis - upper lid   Conjunctiva/Sclera White and quiet Temporal Pinguecula   Cornea well healed superior cataract wound, 1-2+Punctate epithelial erosions, Arcus, tear film debris, decreased TBUT 1+Punctate epithelial erosions, trace fine endo pigment, tear film debris, irregular epi, decreased TBUT   Anterior Chamber deep and clear Deep and quiet   Iris Round and poorly dilated, scattered Transillumination defects 360, +iridodenesis, +pigment deposition, +atrophy Round and dilated   Lens Sutured Akreos IOL well centered  3 piece Posterior chamber intraocular in good position, trace Posterior capsular opacification   Anterior Vitreous post vitrectomy, trace fine pigment Vitreous syneresis, Posterior vitreous detachment         Fundus Exam       Right Left   Disc 1-2+Pallor, Sharp rim 2+Pallor, Sharp rim, mild PPA/PPP, mild tilt, Compact   C/D Ratio 0.3 0.5   Macula Flat, Blunted foveal reflex, mild RPE mottling and clumping, trace central cystic changes -- improved, +pigment deposition on retinal surface -- improved, rare MA, mild ERM Flat, Blunted foveal reflex, Retinal pigment epithelial mottling and clumping, No heme or edema   Vessels attenuated, Tortuous attenuated, Tortuous   Periphery attached, good 360 laser in place, rare MA/DBH; ORIGINALLY: bullous superior retinal detachment from 1000-0130, another more shallow detachment lobe from 130-400, lattice and micro tears ST quadrant; Old retinal tear at 0900 with surrounding laser Attached, peripheral laser scars at 1030, No new RT/RD           IMAGING AND PROCEDURES  Imaging and Procedures for @TODAY @  OCT, Retina - OU - Both Eyes       Right Eye Quality was good. Central Foveal Thickness: 283. Progression has improved. Findings include normal foveal contour, no IRF, no SRF (Interval improvement in IRF/cystic changes nasal fovea, trace ERM).   Left Eye Quality was good. Central Foveal Thickness: 268. Progression has been stable. Findings include normal foveal contour, no IRF, no SRF.   Notes *Images captured and stored on drive  Diagnosis / Impression:  OD: Interval improvement in IRF/cystic changes nasal fovea, trace ERM OS: NFP, no IRF/SRF   Clinical management:  See below  Abbreviations: NFP - Normal foveal profile. CME - cystoid macular edema. PED - pigment epithelial detachment. IRF - intraretinal fluid. SRF - subretinal fluid. EZ - ellipsoid zone. ERM - epiretinal membrane. ORA - outer retinal atrophy. ORT - outer retinal  tubulation. SRHM - subretinal hyper-reflective material            ASSESSMENT/PLAN:    ICD-10-CM   1. Dislocation of intraocular lens, sequela  T85.22XS     2. Right retinal detachment  H33.21     3. 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 History of repair of retinal tear by laser photocoagulation  Z98.890     5. Diabetes mellitus type 2 without retinopathy ([name])  E11.9     6. Essential hypertension  I10     7. Hypertensive retinopathy of both eyes  H35.033 OCT, Retina - OU - Both Eyes    8. Pseudophakia of both eyes  Z96.1     9. Cystoid macular edema of right eye  H35.351 OCT, Retina - OU - Both Eyes  10. 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 Dislocated IOL OD  - pt with history of partially dislocated 3-piece IOL -- spontaneously dislocated completely into vitreous cavity on 1.23.21  - s/p 25g PPV w/ IOL explantation and secondary sutured IOL OD -- Akreos AO60 lens, 17.5D -- 02.11.2021             - IOL in good position  - 2 superior nylon sutures removed at slit lamp 03.12.21 -- last nylon suture removed at slit lamp, 04.30.21  - corneal edema resolved; PEE improved             - s/p STK OD #1 (07.09.21), #2 (01.26.22), #3 (08.30.22) for CME  - BCVA 20/30 - stable  - OCT Interval improvement in IRF/cystic changes nasal fovea, trace ERM (see #9 below)  - IOP 18  - Ats QID OU              - f/u 6 months, sooner prn --DFE/OCT   2,3. Rhegmatogenous retinal detachment, right eye  - bullous, superior, mac off detachment, onset of foveal involvement 11.11.20 by history  - Bi-lobed superior detachment, superior lobe spanning 1000-0130, nasal lobe 0130-0400  - lattice degeneration with microtears noted at 1030  - s/p PPV/PFC/EL/FAX/14% C3F8 OD, 11.12.20             - intraop: HST at 2 oclock was found; also detachment had progressed to subtotal detachment spanning 9 oclock to 6 oclock (going in clockwise direction); also severe zonular  insufficiency with IOL very mobile throughout case -- did not dislocate completely during the case or immediately post op  - doing well             - retina attached and in good position--good laser in place  4. History of retinal defects s/p laser retinopexy OU with Dr. Ashley Royalty in 2013  - OD laser at 0900  - OS laser at 1030  - stable  5. Diabetes mellitus, type 2 without retinopathy  - A1c was 6.2 in August 2022  - The incidence, risk factors for progression, natural history and treatment options for diabetic retinopathy  were discussed with patient.    - The need for close monitoring of blood glucose, blood pressure, and serum lipids, avoiding cigarette or any type of tobacco, and the need for long term follow up was also discussed with patient.   6,7. Hypertensive retinopathy OU  - discussed importance of tight BP control  - monitor  8. Pseudophakia OU  - s/p CE/IOL OU (Dr. Elmer Picker)  - 3 piece IOL OD was completely displaced into vitreous -- now with sutured Akreos IOL in good position OD -- see above  - OS 3-piece IOL in good position  - monitor  9. CME OD  - post op edema-- recurrent/chronic  - s/p STK OD #1 07.09.21, #2 (01.26.22), #3 (08.24.22)  - OCT OD shows Interval improvement in IRF/cystic changes nasal fovea   - cont Lotemax SM BID OD and Prolensa (bromfenac) BID OD  - cont Cosopt daily OD  - f/u 6 months DFE, OCT  10. Ocular Hypertension OD  - IOP: 18  - continue cosopt daily   Ophthalmic Meds Ordered this visit:  Meds ordered this encounter  Medications   Loteprednol Etabonate (LOTEMAX SM) 0.38 % GEL    Sig: Place 1 drop into the right eye 2 (two) times daily.    Dispense:  5 g    Refill:  9   Bromfenac Sodium 0.09 % SOLN    Sig: Place 1 drop into the right eye 2 (two) times daily.    Dispense:  3.4 mL    Refill:  9     Return in 6 months (on 09/17/2023) for CME OD, DFE, OCT.  There are no Patient Instructions on file for this visit.  This document  serves as a record of services personally performed by Karie Chimera, MD, PhD. It was created on their behalf by Glee Arvin. Manson Passey, OA an ophthalmic technician. The creation of this record is the provider's dictation and/or activities during the visit.    Electronically signed by: Glee Arvin. Manson Passey, OA 03/17/23 1:20 PM  Karie Chimera, M.D., Ph.D. Diseases & Surgery of the Retina and Vitreous [facility] 03/17/2023   I have reviewed the above documentation for accuracy and completeness, and I agree with the above. Karie Chimera, M.D., Ph.D. 03/17/23 1:20 PM  Abbreviations: M myopia (nearsighted); A astigmatism; H hyperopia (farsighted); P presbyopia; Mrx spectacle prescription;  CTL contact lenses; OD right eye; OS left eye; OU both eyes  XT exotropia; ET esotropia; PEK punctate epithelial keratitis; PEE punctate epithelial erosions; DES dry eye syndrome; MGD meibomian gland dysfunction; ATs artificial tears; PFAT's preservative free artificial tears; NSC nuclear sclerotic cataract; PSC posterior subcapsular cataract; ERM epi-retinal membrane; PVD posterior vitreous detachment; RD retinal detachment; DM diabetes mellitus; DR diabetic retinopathy; NPDR non-proliferative diabetic retinopathy; PDR proliferative diabetic retinopathy; CSME clinically significant macular edema; DME diabetic macular edema; dbh dot blot hemorrhages; CWS cotton wool spot; POAG primary open angle glaucoma; C/D cup-to-disc ratio; HVF humphrey visual field; GVF goldmann visual field; OCT optical coherence tomography; IOP intraocular pressure; BRVO Branch retinal vein occlusion; CRVO central retinal vein occlusion; CRAO central retinal artery occlusion; BRAO branch retinal artery occlusion; RT retinal tear; SB scleral buckle; PPV pars plana vitrectomy; VH Vitreous hemorrhage; PRP panretinal laser photocoagulation; IVK intravitreal kenalog; VMT vitreomacular traction; MH Macular hole;  NVD neovascularization of  the disc; NVE neovascularization elsewhere; AREDS age related eye disease study; ARMD age related macular degeneration; POAG primary open angle glaucoma; EBMD epithelial/anterior basement membrane dystrophy; ACIOL anterior chamber intraocular lens; IOL intraocular lens; PCIOL posterior chamber intraocular lens; Phaco/IOL phacoemulsification with intraocular lens placement; PRK photorefractive keratectomy; LASIK laser assisted in situ keratomileusis; HTN hypertension; DM diabetes mellitus; COPD chronic obstructive pulmonary disease

## 2023-03-17 ENCOUNTER — Encounter (INDEPENDENT_AMBULATORY_CARE_PROVIDER_SITE_OTHER): Payer: Self-pay | Admitting: Ophthalmology

## 2023-03-17 ENCOUNTER — Ambulatory Visit (INDEPENDENT_AMBULATORY_CARE_PROVIDER_SITE_OTHER): Payer: Medicare Other | Admitting: Ophthalmology

## 2023-03-17 DIAGNOSIS — H40051 Ocular hypertension, right eye: Secondary | ICD-10-CM

## 2023-03-17 DIAGNOSIS — Z9889 Other specified postprocedural states: Secondary | ICD-10-CM

## 2023-03-17 DIAGNOSIS — H3321 Serous retinal detachment, right eye: Secondary | ICD-10-CM | POA: Diagnosis not present

## 2023-03-17 DIAGNOSIS — H35033 Hypertensive retinopathy, bilateral: Secondary | ICD-10-CM

## 2023-03-17 DIAGNOSIS — T8522XS Displacement of intraocular lens, sequela: Secondary | ICD-10-CM

## 2023-03-17 DIAGNOSIS — Z961 Presence of intraocular lens: Secondary | ICD-10-CM

## 2023-03-17 DIAGNOSIS — I10 Essential (primary) hypertension: Secondary | ICD-10-CM | POA: Diagnosis not present

## 2023-03-17 DIAGNOSIS — H35351 Cystoid macular degeneration, right eye: Secondary | ICD-10-CM

## 2023-03-17 DIAGNOSIS — E119 Type 2 diabetes mellitus without complications: Secondary | ICD-10-CM

## 2023-03-17 DIAGNOSIS — H35411 Lattice degeneration of retina, right eye: Secondary | ICD-10-CM

## 2023-03-17 MED ORDER — BROMFENAC SODIUM (ONCE-DAILY) 0.09 % OP SOLN: 1.0000 [drp] | Freq: Two times a day (BID) | OPHTHALMIC | 9 refills | Status: AC

## 2023-03-17 MED ORDER — LOTEMAX SM 0.38 % OP GEL: 1.0000 [drp] | Freq: Two times a day (BID) | OPHTHALMIC | 9 refills | Status: DC

## 2023-04-05 NOTE — Telephone Encounter (Signed)
This encounter was created in error - please disregard.

## 2023-05-17 ENCOUNTER — Encounter: Payer: Self-pay | Admitting: Cardiology

## 2023-05-17 ENCOUNTER — Ambulatory Visit: Payer: Medicare Other | Attending: Cardiology | Admitting: Cardiology

## 2023-05-17 VITALS — BP 134/74 | HR 66 | Resp 16 | Ht 68.0 in | Wt 202.6 lb

## 2023-05-17 DIAGNOSIS — I25118 Atherosclerotic heart disease of native coronary artery with other forms of angina pectoris: Secondary | ICD-10-CM

## 2023-05-17 DIAGNOSIS — I480 Paroxysmal atrial fibrillation: Secondary | ICD-10-CM

## 2023-05-17 DIAGNOSIS — I5032 Chronic diastolic (congestive) heart failure: Secondary | ICD-10-CM

## 2023-05-17 DIAGNOSIS — Z953 Presence of xenogenic heart valve: Secondary | ICD-10-CM

## 2023-05-17 NOTE — Progress Notes (Signed)
Cardiology Office Note:  .   Date:  05/17/2023  ID:  Cody Phelps, DOB [birth year], MRN [id number] PCP: Adrian Prince, MD  [facility] Providers Cardiologist:  Yates Decamp, MD    History of Present Illness: .   Cody Phelps is a 82 y.o. 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   Discussed the use of AI scribe software for clinical note transcription with the patient, who gave verbal consent to proceed.  History of Present Illness   The patient, with a history of heart disease, heart failure, atrial fibrillation, and a prosthetic aortic valve, presents for a routine follow-up. He reports no chest pain and has not needed nitroglycerin. He has lost weight and his blood sugar is well controlled with an A1C of 6.8. He has been taking a diuretic as needed for heart failure, but reports no recent swelling in the legs. He sleeps flat with one pillow for his head and one for his back. He has noticed some memory loss. He had a small knot on his arm after a catheterization, but it has since resolved on its own. He has also started a nicotine patch in an attempt to improve memory, but has not noticed any changes yet.      Review of Systems  Cardiovascular:  Positive for leg swelling (occasional). Negative for chest pain and dyspnea on exertion.    Risk Assessment/Calculations:    CHA2DS2-VASc Score = 6   This indicates a 9.7% annual risk of stroke. The patient's score is based  upon: CHF History: 1 HTN History: 1 Diabetes History: 1 Stroke History: 0 Vascular Disease History: 1 Age Score: 2 Gender Score: 0             Lab Results  Component Value Date   CHOL 142 09/15/2020   HDL 53 09/15/2020   LDLCALC 70 09/15/2020   TRIG 101 09/15/2020   CHOLHDL 2.7 03/01/2018   Lab Results  Component Value Date   NA 140 10/26/2021   K 3.5 10/26/2021   CO2 22 10/20/2021   GLUCOSE 110 (H) 10/20/2021   BUN 18 10/20/2021   CREATININE 1.03 10/20/2021   CALCIUM 9.3 10/20/2021   GFR 53.01 (L) 09/03/2021   EGFR 74 10/20/2021   GFRNONAA >60 09/03/2021   Lab Results  Component Value Date   WBC 4.8 08/17/2022   HGB 10.9 (L) 08/17/2022   HCT 30.6 (L) 08/17/2022   MCV 84.1 08/17/2022   PLT 176 08/17/2022    External Labs:  Labs 10/11/2022:   Serum creatinine 1.230.  BUN 21.  Serum glucose 127.  Sodium 138.  Potassium 3.7.  eGFR 60 mL.   Urinary albumin to creatinine ratio 103.   Hemoglobin 11.4/hematocrit 32.2, normal indicis.  Platelets 188.   Total cholesterol 150, triglycerides 234, HDL 41, LDL 62.   TSH normal at 1.21.  Physical Exam:   VS:  BP 134/74 (BP Location: Left Arm, Patient Position: Sitting, Cuff Size: Large)   Pulse 66   Resp 16   Ht 5\' 8"  (1.727 m)  Wt 202 lb 9.6 oz (91.9 kg)   SpO2 98%   BMI 30.81 kg/m    Wt Readings from Last 3 Encounters:  05/17/23 202 lb 9.6 oz (91.9 kg)  11/15/22 198 lb 3.2 oz (89.9 kg)  08/17/22 203 lb 11.2 oz (92.4 kg)     Physical Exam Vitals reviewed.  Constitutional:      Appearance: He is well-developed. He is obese.  Neck:     Vascular: Carotid bruit (bilateral) present. No JVD.  Cardiovascular:     Rate and Rhythm: Normal rate and regular rhythm.     Pulses: Intact distal pulses.          Popliteal pulses are 2+ on the right side and 2+ on the left side.       Dorsalis pedis pulses are 2+ on the right side and 1+ on the left side.       Posterior tibial pulses are 0 on the right side and 0  on the left side.     Heart sounds: Murmur heard.     Early systolic murmur is present with a grade of 2/6 at the upper right sternal border radiating to the apex.     No gallop.  Pulmonary:     Effort: Pulmonary effort is normal.     Breath sounds: Normal breath sounds.  Abdominal:     General: Bowel sounds are normal.     Palpations: Abdomen is soft.  Musculoskeletal:     Right lower leg: No edema.     Left lower leg: No edema.     Studies Reviewed: Marland Kitchen    EKG:    EKG Interpretation Date/Time:  Wednesday May 17 2023 16:14:06 EDT Ventricular Rate:  67 PR Interval:  280 QRS Duration:  114 QT Interval:  442 QTC Calculation: 467 R Axis:   -62  Text Interpretation: EKG 05/17/2023: Sinus rhythm with first-degree AV block at the rate of 67 bpm, left anterior fascicular block.  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 Nonspecific T abnormality.  Compared to 10/26/2021, atypical atrial flutter not present but otherwise no change. Confirmed by Delrae Rend [phone]) on 05/17/2023 4:23:21 PM    EKG 11/15/2022: Sinus rhythm with first-degree AV block at the rate of 68 bpm, left axis deviation, left anterior fascicular block.  Incomplete right bundle branch block.  Poor R progression, cannot exclude anterolateral infarct old.  Nonspecific T abnormality.   Echocardiogram 10/05/2021: Normal LV systolic function, EF 59%.  Indeterminate diastolic function due to severe mitral annular calcification. Bioprosthetic aortic valve normal function. Moderate calcification of the mitral annulus.  Mild mitral stenosis, mild mitral regurgitation.  Mitral valve peak gradient 12.6 mmHg, mean gradient 4.4 mmHg calculated mitral valve area by pressure half-time of 1.9 cm.  No change from 03/15/2021.  Lexiscan nuclear stress test 10/05/2021: Small to moderate size reversible ischemia in the inferior region, EF 78%.  Right and left heart catheterization: Moderate pulm hypertension with elevated LVEDP. Dominance:  Left LM: Large vessel, mild disease, mild calcification. LAD: Large vessel, moderate-sized D1 with ostial 60% stenosis.  LIMA to D1 is widely patent with competitive filling. LCx: Large and dominant.  Very mild disease. RI: N/A RCA: Small and nondominant, diffusely diseased.   ASSESSMENT AND PLAN: .      ICD-10-CM   1. Coronary artery disease of native artery of native heart with stable angina pectoris ([name])  I25.118 EKG 12-Lead    2. Chronic diastolic CHF (congestive heart failure) ([name])  I50.32 EKG 12-Lead  3. Paroxysmal atrial fibrillation ([name])  I48.0 EKG 12-Lead    4. S/P aortic valve replacement with bioprosthetic valve  Z95.3 EKG 12-Lead      Assessment and Plan  1. Coronary artery disease of native artery of native heart with stable angina pectoris [facility]) Patient presents for a 64-month office visit and follow-up of coronary disease. He has remained stable without recurrence of angina pectoris.   2. Chronic diastolic CHF (congestive heart failure) ([name]) No recent symptoms of heart failure. Stable on Valsartan HCT and Spironolactone. -Continue Valsartan HCT and Spironolactone. -Continue as-needed use of diuretic for fluid management.  3. Paroxysmal atrial fibrillation ([name]) Maintaining regular rhythm. Stable on Eliquis. -Continue Eliquis.  4. S/P aortic valve replacement with bioprosthetic valve Stable on physical exam. No need for repeat echocardiogram at this time. -Continue current management. -Remind patient to take prophylactic antibiotics before any dental procedures. Office visit in 1 year.  Signed,  Yates Decamp, MD, [facility] 05/17/2023, 9:19 PM [facility] [address] [place], [place] [address] Phone: [phone]. Fax:  [phone]

## 2023-05-17 NOTE — Patient Instructions (Signed)
Medication Instructions:  Your physician recommends that you continue on your current medications as directed. Please refer to the Current Medication list given to you today.  *If you need a refill on your cardiac medications before your next appointment, please call your pharmacy*  Lab Work: None ordered today. If you have labs (blood work) drawn today and your tests are completely normal, you will receive your results only by: MyChart Message (if you have MyChart) OR A paper copy in the mail If you have any lab test that is abnormal or we need to change your treatment, we will call you to review the results.  Testing/Procedures: None ordered today.  Follow-Up: At [facility], you and your health needs are our priority.  As part of our continuing mission to provide you with exceptional heart care, we have created designated Provider Care Teams.  These Care Teams include your primary Cardiologist (physician) and Advanced Practice Providers (APPs -  Physician Assistants and Nurse Practitioners) who all work together to provide you with the care you need, when you need it.  Your next appointment:   1 year(s)  The format for your next appointment:   In Person  Provider:   Yates Decamp, MD {

## 2023-09-11 NOTE — Progress Notes (Signed)
[facility] - Clinic Note  09/18/2023     CHIEF COMPLAINT Patient presents for Retina Follow Up  HISTORY OF PRESENT ILLNESS: Cody Phelps is a 83 y.o. male who presents to the clinic today for:  HPI     Retina Follow Up   Patient presents with  Other.  In right eye.  Severity is moderate.  Duration of 6 months.  Since onset it is stable.  I, the attending physician,  performed the HPI with the patient and updated documentation appropriately.        Comments   Patient feels the vision is doing pretty good. He is using Lotemax SM BID OD, Prolensa (bromfenac) BID OD, and  Cosopt daily OD. He does not check his blood sugar daily.        Last edited by Rennis Chris, MD on 09/19/2023  1:14 AM.    Pt states his vision is the same, his wife states she is giving him the drops, but can't promise that he is getting them twice a day  Referring physician: Adrian Prince, MD [address] [place],  [place] [address]  HISTORICAL INFORMATION:   Selected notes from the MEDICAL RECORD NUMBER Referred by Dr. [place] DeMarco for concern of RD OD   CURRENT MEDICATIONS: Current Outpatient Medications (Ophthalmic Drugs)  Medication Sig   Bromfenac Sodium (PROLENSA) 0.07 % SOLN Place 1 drop into the right eye 2 (two) times daily.   Bromfenac Sodium 0.09 % SOLN Place 1 drop into the right eye 2 (two) times daily.   dorzolamide-timolol (COSOPT) 22.3-6.8 MG/ML ophthalmic solution Place 1 drop into the right eye daily.   Loteprednol Etabonate (LOTEMAX SM) 0.38 % GEL Place 1 drop into the right eye 2 (two) times daily.   No current facility-administered medications for this visit. (Ophthalmic Drugs)   Current Outpatient Medications (Other)  Medication Sig   apixaban (ELIQUIS) 5 MG TABS tablet Take 1 tablet (5 mg total) by mouth 2 (two) times daily.   atorvastatin (LIPITOR) 80 MG tablet TAKE 1 TABLET BY MOUTH  DAILY (Patient taking differently: Take 40 mg by mouth at bedtime.)    colesevelam (WELCHOL) 625 MG tablet Take 625 mg by mouth 3 (three) times daily.    donepezil (ARICEPT) 10 MG tablet Take 10 mg by mouth every morning.    esomeprazole (NEXIUM) 40 MG capsule Take 40 mg by mouth daily.    ferrous sulfate 325 (65 FE) MG tablet TAKE 1 TABLET BY MOUTH EVERY DAY WITH BREAKFAST   gabapentin (NEURONTIN) 300 MG capsule Take 300 mg by mouth at bedtime.   GEMTESA 75 MG TABS Take 1 tablet by mouth daily.   insulin glargine (LANTUS) 100 UNIT/ML injection Inject 20-40 Units into the skin at bedtime as needed (BGL below 140-150 take 20 units, if 151 or higher take 40 units).   memantine (NAMENDA) 10 MG tablet Take 1 tablet (10 mg total) by mouth 2 (two) times daily. Please call and make overdue appt for further refills. 2nd attempt (Patient taking differently: Take 5 mg by mouth 2 (two) times daily. Please call and make overdue appt for further refills. 2nd attempt)   metoprolol tartrate (LOPRESSOR) 25 MG tablet TAKE 1 TABLET BY MOUTH  DAILY (Patient taking differently: Take 12.5 mg by mouth 2 (two) times daily.)   nitroGLYCERIN (NITROSTAT) 0.4 MG SL tablet Place 1 tablet (0.4 mg total) under the tongue every 5 (five) minutes as needed for up to 25 days for chest  pain.   omega-3 acid ethyl esters (LOVAZA) 1 G capsule Take 2 g by mouth daily.   rOPINIRole (REQUIP) 2 MG tablet Take 3-4 mg by mouth daily as needed (RLS).   spironolactone (ALDACTONE) 25 MG tablet TAKE ONE-HALF TABLET BY  MOUTH DAILY   tamsulosin (FLOMAX) 0.4 MG CAPS capsule Take 0.4 mg by mouth daily after supper.    torsemide (DEMADEX) 20 MG tablet Take 1 tablet (20 mg total) by mouth as directed. 2 times a week on Tuesday and Thursday.  Take additional dose as directed with weight gain of 2 Lbs (Patient taking differently: Take 20 mg by mouth as needed (swelling). 2 times a week on Tuesday and Thursday.  Take additional dose as directed with weight gain of 2 Lbs)   valsartan-hydrochlorothiazide (DIOVAN-HCT) 80-12.5  MG tablet TAKE 1 TABLET BY MOUTH IN  THE MORNING   vitamin B-12 (CYANOCOBALAMIN) 1000 MCG tablet Take 1,000 mcg by mouth 2 (two) times daily.   No current facility-administered medications for this visit. (Other)   REVIEW OF SYSTEMS: ROS   Positive for: Gastrointestinal, Musculoskeletal, Endocrine, Cardiovascular, Eyes, Respiratory Negative for: Constitutional, Neurological, Skin, Genitourinary, HENT, Psychiatric, Allergic/Imm, Heme/Lymph Last edited by Charlette Caffey, COT on 09/18/2023  1:21 PM.     ALLERGIES No Known Allergies  PAST MEDICAL HISTORY Past Medical History:  Diagnosis Date   Acute anterior wall MI ([name]) 03/01/2018   Acute combined systolic and diastolic heart failure ([name]) 03/15/2018   Burn    Chronic diastolic CHF (congestive heart failure) ([name]) 09/21/2018   Defect, retina, with detachment    Dementia ([name])    Diverticulosis    ED (erectile dysfunction)    First degree heart block    GERD (gastroesophageal reflux disease)    Heart murmur    History of blood transfusion    with open heart surgery   Hypertension    cardiologsit-  dr Jacinto Halim   Hypertensive retinopathy    OU   Iron deficiency anemia    Mixed hyperlipidemia    Morbid obesity ([name])    Myocardial infarction ([name])    02/28/18   OA (osteoarthritis)    right hip   OSA on CPAP    followed by dr dohmeier, uses CPAP   PAF (paroxysmal atrial fibrillation) ([name])    a. on Eliquis   Prostate cancer ([name])    dx 03-17-2017 (bx)  Stage T2a, Gleason 4+4, PSA  4.43, vol 32.32--  s/p radiatctive prostate seed implants 07-10-2017 then IMRT and ADT   Retinal detachment    Rheg. 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   LIMA to Diagonal Branch   S/P Maze operation for atrial fibrillation 05/30/2018   Complete bilateral atrial lesion set using  bipolar radiofrequency and cryothermy ablation with clipping of LA appendage   Scoliosis    Trigger finger    Type 2 diabetes mellitus ([name])    Wears partial dentures    upper and lower   Past Surgical History:  Procedure Laterality Date   AORTIC VALVE REPLACEMENT N/A 05/30/2018   Procedure: AORTIC VALVE REPLACEMENT (AVR) using Inspiris Valve, Size 23;  Surgeon: Purcell Nails, MD;  Location: [facility] OR;  Service: Open Heart Surgery;  Laterality: N/A;   APPENDECTOMY  1988   BILATERAL TOTAL ETHMOIDECTOMY AND SPHENOIDECTOMY  01-14-2009  dr Jenne Pane   [facility]   CARDIOVERSION N/A 08/10/2018   Procedure: CARDIOVERSION;  Surgeon: Yates Decamp, MD;  Location: [facility] ENDOSCOPY;  Service: Cardiovascular;  Laterality: N/A;   CARPAL TUNNEL RELEASE Bilateral 2013   CATARACT EXTRACTION Bilateral    Dr. Elmer Picker   CATARACT EXTRACTION W/ INTRAOCULAR LENS  IMPLANT, BILATERAL  date?   CORONARY ARTERY BYPASS GRAFT N/A 05/30/2018   CORONARY BALLOON ANGIOPLASTY N/A 03/01/2018   Procedure: CORONARY BALLOON ANGIOPLASTY;  Surgeon: Yates Decamp, MD;  Location: [facility] INVASIVE CV LAB;  Service: Cardiovascular;  Laterality: N/A;   CORONARY/GRAFT ACUTE MI REVASCULARIZATION N/A 03/01/2018   Procedure: Coronary/Graft Acute MI Revascularization;  Surgeon: Yates Decamp, MD;  Location: [facility] INVASIVE CV LAB;  Service: Cardiovascular;  Laterality: N/A;   CYSTOSCOPY  07/19/2017   Procedure: CYSTOSCOPY;  Surgeon: Hildred Laser, MD;  Location: [facility];  Service: Urology;;  No seeds found in blader   EYE SURGERY Bilateral    Cat Sx OU.  Dislocated IOL sx OD.  Rheg. RD repair OS.   EYE SURGERY     GAS/FLUID EXCHANGE Right 06/13/2019   Procedure: GAS/FLUID EXCHANGE;  Surgeon: Rennis Chris, MD;  Location: [facility] OR;  Service: Ophthalmology;  Laterality: Right;   LAMINECTOMY WITH POSTERIOR LATERAL ARTHRODESIS LEVEL 2 N/A 02/12/2014   Procedure: LUMBAR TWO-THREE,LUIMBAR THREE-FOUR LAMINECTOMY/FORAMINOTOMY;POSSIBLE POSTEROLATERAL  ARTHRODESIS WITH AUTOGRAFT;  Surgeon: Karn Cassis, MD;  Location: [facility] NEURO ORS;  Service: Neurosurgery;  Laterality: N/A;   LEFT HEART CATH AND CORONARY ANGIOGRAPHY N/A 03/01/2018   Procedure: LEFT HEART CATH AND CORONARY ANGIOGRAPHY;  Surgeon: Yates Decamp, MD;  Location: [facility] INVASIVE CV LAB;  Service: Cardiovascular;  Laterality: N/A;   MAZE N/A 05/30/2018   Procedure: MAZE;  Surgeon: Purcell Nails, MD;  Location: [facility] OR;  Service: Open Heart Surgery;  Laterality: N/A;   ORIF RIGHT ANKLE FX'S  05/05/2001   retained hardware   PARS PLANA VITRECTOMY Right 06/13/2019   Procedure: PARS PLANA VITRECTOMY WITH 25 GAUGE WITH LASER AND GAS;  Surgeon: Rennis Chris, MD;  Location: [facility] OR;  Service: Ophthalmology;  Laterality: Right;   PARS PLANA VITRECTOMY Right 09/12/2019   Procedure: Pars Plana Vitrectomy With 25 Gauge;  Surgeon: Rennis Chris, MD;  Location[facility] OR;  Service: Ophthalmology;  Laterality: Right;   PERFLUORONE INJECTION Right 06/13/2019   Procedure: PERFLUORONE INJECTION;  Surgeon: Rennis Chris, MD;  Location: [facility] OR;  Service: Ophthalmology;  Laterality: Right;   PHOTOCOAGULATION WITH LASER Right 06/13/2019   Procedure: PHOTOCOAGULATION WITH LASER;  Surgeon: Rennis Chris, MD;  Location: [facility] OR;  Service: Ophthalmology;  Laterality: Right;   PLACEMENT AND SUTURE OF SECONDARY INTRAOCULAR LENS Right 09/12/2019   Procedure: PLACEMENT AND SUTURE OF SECONDARY INTRAOCULAR LENS;  Surgeon: Rennis Chris, MD;  Location: [facility] OR;  Service: Ophthalmology;  Laterality: Right;   PROSTATE BIOPSY  03-17-2017   dr Sherryl Barters office   RADIOACTIVE SEED IMPLANT N/A 07/19/2017   Procedure: RADIOACTIVE SEED IMPLANT/BRACHYTHERAPY IMPLANT;  Surgeon: Hildred Laser, MD;  Location: [facility];  Service: Urology;  Laterality: N/A;  69 seeds implanted   REMOVAL RETAINED LENS Right 09/12/2019   Procedure: REMOVAL RETAINED LENS;  Surgeon: Rennis Chris, MD;  Location: [facility] OR;  Service: Ophthalmology;   Laterality: Right;   RETINAL DETACHMENT SURGERY Right 06/13/2019   Dr. Vanessa Barbara   RIGHT/LEFT HEART CATH AND CORONARY/GRAFT ANGIOGRAPHY N/A 10/26/2021   Procedure: RIGHT/LEFT HEART CATH AND CORONARY/GRAFT ANGIOGRAPHY;  Surgeon: Yates Decamp, MD;  Location: [facility] INVASIVE CV LAB;  Service: Cardiovascular;  Laterality: N/A;   SKIN GRAFT     face   SPACE OAR INSTILLATION N/A 07/19/2017   Procedure: SPACE OAR INSTILLATION;  Surgeon: Hildred Laser, MD;  Location: [facility];  Service: Urology;  Laterality: N/A;   TEE WITHOUT CARDIOVERSION  03/20/2012   Procedure: TRANSESOPHAGEAL ECHOCARDIOGRAM (TEE);  Surgeon: Pamella Pert, MD;  Location: [facility] ENDOSCOPY;  Service: Cardiovascular;  Laterality: N/A;  normal LV; normal EF; normal RV; normal LA w/ left atrial appendage very small, normal function, interatrial septum intact without defect; normal RA; trace MR,TR, & PI; mild AV calcification and senile degeneration w/ mild stenosis, AVA 1.7cm^2;;    TEE WITHOUT CARDIOVERSION N/A 05/30/2018   Procedure: TRANSESOPHAGEAL ECHOCARDIOGRAM (TEE);  Surgeon: Purcell Nails, MD;  Location: [facility] OR;  Service: Open Heart Surgery;  Laterality: N/A;   TOTAL HIP ARTHROPLASTY Right 10/23/2017   Procedure: TOTAL HIP ARTHROPLASTY ANTERIOR APPROACH;  Surgeon: Gean Birchwood, MD;  Location: [facility] OR;  Service: Orthopedics;  Laterality: Right;   TRANSTHORACIC ECHOCARDIOGRAM  01-11-2017   dr Jacinto Halim  (per echo note, no significant change in seveity of AS, no other diagnostic change)   moderate concentric LVH, ef 64%, grade 1 diastolic dysfunction/  moderate LAE/  mild , grade 1 AR w/ moderate AV calcification, mild to moderate restricted AV leaflets w/ moderate AS, AVA 1.16cm^2, peak grandient , mean grandient 59mmHg/  trace MR, mild calcification MV annulus , mild MV leaflet calcification, mild MVS, peak grandient 4.70mmHg, mean grandient 2.30mmHg/ trace TR   FAMILY HISTORY Family History  Problem Relation Age of  Onset   Stroke Mother 66   Hypertension Mother    Hypertension Father    Heart disease Father    Heart attack Father 11   Heart murmur Father    Hypertension Sister    Lung disease Brother    Colon cancer Neg Hx    Stomach cancer Neg Hx    Rectal cancer Neg Hx    Pancreatic cancer Neg Hx    SOCIAL HISTORY Social History   Tobacco Use   Smoking status: Former    Current packs/day: 0.00    Average packs/day: 0.3 packs/day for 1 year (0.3 ttl pk-yrs)    Types: Cigarettes    Start date: 02/03/1966    Quit date: 02/04/1967    Years since quitting: 56.6   Smokeless tobacco: Never   Tobacco comments:    smoked 1 q2-3 days  Vaping Use   Vaping status: Never Used  Substance Use Topics   Alcohol use: Not Currently    Comment: occasional beer   Drug use: Never       OPHTHALMIC EXAM:  Base Eye Exam     Visual Acuity (Snellen - Linear)       Right Left   Dist cc 20/30 +2 20/20 +2   Dist ph cc NI     Correction: Glasses         Tonometry (Tonopen, 1:26 PM)       Right Left   Pressure 17 15         Pupils       Dark Light Shape React APD   Right 3 2 Round Slow None   Left 3 2 Round Slow None         Visual Fields       Left Right    Full Full         Extraocular Movement  Right Left    Full, Ortho Full, Ortho         Neuro/Psych     Oriented x3: Yes   Mood/Affect: Normal         Dilation     Both eyes: 1.0% Mydriacyl, 2.5% Phenylephrine @ 1:22 PM           Slit Lamp and Fundus Exam     Slit Lamp Exam       Right Left   Lids/Lashes Dermatochalasis - upper lid, Meibomian gland dysfunction Dermatochalasis - upper lid   Conjunctiva/Sclera White and quiet Temporal Pinguecula   Cornea well healed superior cataract wound, 1-2+Punctate epithelial erosions, Arcus, tear film debris, decreased TBUT 1+Punctate epithelial erosions, trace fine endo pigment, tear film debris, irregular epi, decreased TBUT   Anterior Chamber deep and  clear Deep and quiet   Iris Round and poorly dilated, scattered Transillumination defects 360, +iridodenesis, +pigment deposition, +atrophy Round and dilated   Lens Sutured Akreos IOL well centered 3 piece Posterior chamber intraocular in good position, trace Posterior capsular opacification   Anterior Vitreous post vitrectomy, trace fine pigment Vitreous syneresis, Posterior vitreous detachment         Fundus Exam       Right Left   Disc 1-2+Pallor, Sharp rim 2+Pallor, Sharp rim, mild PPA/PPP, mild tilt, Compact   C/D Ratio 0.3 0.5   Macula Flat, Blunted foveal reflex, mild RPE mottling and clumping, trace central cystic changes -- slightly increased, +pigment deposition on retinal surface -- improved, focal MA nasal fovea, mild ERM Flat, good foveal reflex, Retinal pigment epithelial mottling and clumping, No heme or edema   Vessels attenuated, Tortuous attenuated, Tortuous   Periphery attached, good 360 laser in place, rare MA/DBH; ORIGINALLY: bullous superior retinal detachment from 1000-0130, another more shallow detachment lobe from 130-400, lattice and micro tears ST quadrant; Old retinal tear at 0900 with surrounding laser Attached, peripheral laser scars at 1030, No new RT/RD           IMAGING AND PROCEDURES  Imaging and Procedures for @TODAY @  OCT, Retina - OU - Both Eyes       Right Eye Quality was good. Central Foveal Thickness: 312. Progression has worsened. Findings include normal foveal contour, no IRF, no SRF (Interval increase in IRF/cystic changes nasal fovea, trace ERM).   Left Eye Quality was good. Central Foveal Thickness: 269. Progression has been stable. Findings include normal foveal contour, no IRF, no SRF.   Notes *Images captured and stored on drive  Diagnosis / Impression:  OD: Interval increase in IRF/cystic changes nasal fovea (+CME), trace ERM OS: NFP, no IRF/SRF   Clinical management:  See below  Abbreviations: NFP - Normal foveal profile.  CME - cystoid macular edema. PED - pigment epithelial detachment. IRF - intraretinal fluid. SRF - subretinal fluid. EZ - ellipsoid zone. ERM - epiretinal membrane. ORA - outer retinal atrophy. ORT - outer retinal tubulation. SRHM - subretinal hyper-reflective material            ASSESSMENT/PLAN:    ICD-10-CM   1. Dislocation of intraocular lens, sequela  T85.22XS     2. Right retinal detachment  H33.21     3. 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 History of repair of retinal tear by laser photocoagulation  Z98.890     5. Diabetes mellitus type 2 without retinopathy ([name])  E11.9 OCT, Retina - OU - Both Eyes    6. Essential hypertension  I10     7. Hypertensive retinopathy of both eyes  H35.033     8. Pseudophakia of both eyes  Z96.1     9. Cystoid macular edema of right eye  H35.351 OCT, Retina - OU - Both Eyes    10. 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 Dislocated IOL OD  - pt with history of partially dislocated 3-piece IOL -- spontaneously dislocated completely into vitreous cavity on 1.23.21  - s/p 25g PPV w/ IOL explantation and secondary sutured IOL OD -- Akreos AO60 lens, 17.5D -- 02.11.2021             - IOL in good position  - 2 superior nylon sutures removed at slit lamp 03.12.21 -- last nylon suture removed at slit lamp, 04.30.21  - corneal edema resolved; PEE improved             - s/p STK OD #1 (07.09.21), #2 (01.26.22), #3 (08.30.22) for CME  - BCVA 20/30 - stable  - OCT Interval increase in IRF/cystic changes nasal fovea, trace ERM (see #9 below)  - IOP 17  - Ats QID OU              - f/u 4 months --DFE/OCT   2,3. Rhegmatogenous retinal detachment, right eye  - bullous, superior, mac off detachment, onset of foveal involvement 11.11.20 by history  - Bi-lobed superior detachment, superior lobe spanning 1000-0130, nasal lobe 0130-0400  - lattice degeneration with microtears noted at 1030  - s/p PPV/PFC/EL/FAX/14% C3F8 OD,  11.12.20             - intraop: HST at 2 oclock was found; also detachment had progressed to subtotal detachment spanning 9 oclock to 6 oclock (going in clockwise direction); also severe zonular insufficiency with IOL very mobile throughout case -- did not dislocate completely during the case or immediately post op  - doing well             - retina attached and in good position--good laser in place  4. History of retinal defects s/p laser retinopexy OU with Dr. Ashley Royalty in 2013  - OD laser at 0900  - OS laser at 1030  - stable  5. Diabetes mellitus, type 2 without retinopathy  - A1c was 6.2 in August 2022  - The incidence, risk factors for progression, natural history and treatment options for diabetic retinopathy  were discussed with patient.    - The need for close monitoring of blood glucose, blood pressure, and serum lipids, avoiding cigarette or any type of tobacco, and the need for long term follow up was also discussed with patient.   6,7. Hypertensive retinopathy OU  - discussed importance of tight BP control  - monitor  8. Pseudophakia OU  - s/p CE/IOL OU (Dr. Elmer Picker)  - 3 piece IOL OD was completely displaced into vitreous -- now with sutured Akreos IOL in good position OD -- see above  - OS 3-piece IOL in good position  - monitor  9. CME OD  - post op edema-- recurrent/chronic  - s/p STK OD #1 07.09.21, #2 (01.26.22), #3 (08.24.22)  - OCT OD shows Interval increase in IRF/cystic changes nasal fovea   - pt and wife report reduced compliance with Lotemax and Prolensa BID OD  - discussed importance of compliance with drops for vision and chronic inflammation  - cont Lotemax SM BID OD and Prolensa BID OD -- increase to TID  -  cont Cosopt daily OD  - f/u 2-3 months DFE, OCT  10. Ocular Hypertension OD  - IOP: 17  - continue cosopt daily   Ophthalmic Meds Ordered this visit:  No orders of the defined types were placed in this encounter.    Return for f/u 2-3 months,  CME OD, DFE, OCT.  There are no Patient Instructions on file for this visit.  This document serves as a record of services personally performed by Karie Chimera, MD, PhD. It was created on their behalf by Glee Arvin. Manson Passey, OA an ophthalmic technician. The creation of this record is the provider's dictation and/or activities during the visit.    Electronically signed by: Glee Arvin. Manson Passey, OA 09/19/23 1:15 AM  Karie Chimera, M.D., Ph.D. Diseases & Surgery of the Retina and Vitreous [facility] 09/18/2023   I have reviewed the above documentation for accuracy and completeness, and I agree with the above. Karie Chimera, M.D., Ph.D. 09/19/23 1:18 AM   Abbreviations: M myopia (nearsighted); A astigmatism; H hyperopia (farsighted); P presbyopia; Mrx spectacle prescription;  CTL contact lenses; OD right eye; OS left eye; OU both eyes  XT exotropia; ET esotropia; PEK punctate epithelial keratitis; PEE punctate epithelial erosions; DES dry eye syndrome; MGD meibomian gland dysfunction; ATs artificial tears; PFAT's preservative free artificial tears; NSC nuclear sclerotic cataract; PSC posterior subcapsular cataract; ERM epi-retinal membrane; PVD posterior vitreous detachment; RD retinal detachment; DM diabetes mellitus; DR diabetic retinopathy; NPDR non-proliferative diabetic retinopathy; PDR proliferative diabetic retinopathy; CSME clinically significant macular edema; DME diabetic macular edema; dbh dot blot hemorrhages; CWS cotton wool spot; POAG primary open angle glaucoma; C/D cup-to-disc ratio; HVF humphrey visual field; GVF goldmann visual field; OCT optical coherence tomography; IOP intraocular pressure; BRVO Branch retinal vein occlusion; CRVO central retinal vein occlusion; CRAO central retinal artery occlusion; BRAO branch retinal artery occlusion; RT retinal tear; SB scleral buckle; PPV pars plana vitrectomy; VH Vitreous hemorrhage; PRP panretinal laser photocoagulation;  IVK intravitreal kenalog; VMT vitreomacular traction; MH Macular hole;  NVD neovascularization of the disc; NVE neovascularization elsewhere; AREDS age related eye disease study; ARMD age related macular degeneration; POAG primary open angle glaucoma; EBMD epithelial/anterior basement membrane dystrophy; ACIOL anterior chamber intraocular lens; IOL intraocular lens; PCIOL posterior chamber intraocular lens; Phaco/IOL phacoemulsification with intraocular lens placement; PRK photorefractive keratectomy; LASIK laser assisted in situ keratomileusis; HTN hypertension; DM diabetes mellitus; COPD chronic obstructive pulmonary disease

## 2023-09-18 ENCOUNTER — Encounter (INDEPENDENT_AMBULATORY_CARE_PROVIDER_SITE_OTHER): Payer: Self-pay | Admitting: Ophthalmology

## 2023-09-18 ENCOUNTER — Ambulatory Visit (INDEPENDENT_AMBULATORY_CARE_PROVIDER_SITE_OTHER): Payer: Medicare Other | Admitting: Ophthalmology

## 2023-09-18 DIAGNOSIS — Z961 Presence of intraocular lens: Secondary | ICD-10-CM

## 2023-09-18 DIAGNOSIS — H35411 Lattice degeneration of retina, right eye: Secondary | ICD-10-CM

## 2023-09-18 DIAGNOSIS — I10 Essential (primary) hypertension: Secondary | ICD-10-CM

## 2023-09-18 DIAGNOSIS — T8522XS Displacement of intraocular lens, sequela: Secondary | ICD-10-CM | POA: Diagnosis not present

## 2023-09-18 DIAGNOSIS — H35351 Cystoid macular degeneration, right eye: Secondary | ICD-10-CM | POA: Diagnosis not present

## 2023-09-18 DIAGNOSIS — H40051 Ocular hypertension, right eye: Secondary | ICD-10-CM

## 2023-09-18 DIAGNOSIS — H35033 Hypertensive retinopathy, bilateral: Secondary | ICD-10-CM

## 2023-09-18 DIAGNOSIS — E119 Type 2 diabetes mellitus without complications: Secondary | ICD-10-CM

## 2023-09-18 DIAGNOSIS — Z9889 Other specified postprocedural states: Secondary | ICD-10-CM

## 2023-09-18 DIAGNOSIS — H3321 Serous retinal detachment, right eye: Secondary | ICD-10-CM | POA: Diagnosis not present

## 2023-09-19 ENCOUNTER — Encounter (INDEPENDENT_AMBULATORY_CARE_PROVIDER_SITE_OTHER): Payer: Self-pay | Admitting: Ophthalmology

## 2023-09-24 ENCOUNTER — Encounter: Payer: Self-pay | Admitting: Internal Medicine

## 2023-12-12 NOTE — Progress Notes (Signed)
 [facility] - Clinic Note  12/18/2023     CHIEF COMPLAINT Patient presents for Retina Follow Up  HISTORY OF PRESENT ILLNESS: Cody Phelps is a 83 y.o. male who presents to the clinic today for:  HPI     Retina Follow Up   In right eye.  This started 3 months ago.  Duration of 3 months.  Since onset it is stable.  I, the attending physician,  performed the HPI with the patient and updated documentation appropriately.        Comments   3 month retina follow up CME OD pt reports no vision changes he denies any flashes or floaters noticed Lotemax  SM BID OD and Prolensa  BID OD  TID  Cosopt  daily OD        Last edited by Ronelle Coffee, MD on 12/18/2023  5:10 PM.     Pt has been using PF and Prolensa  BID OD as well as Cosopt  qdaily OD  Referring physician: Rosslyn Coons, MD [address] [place],  [place] [address]  HISTORICAL INFORMATION:   Selected notes from the MEDICAL RECORD NUMBER Referred by Dr. [place] DeMarco for concern of RD OD   CURRENT MEDICATIONS: Current Outpatient Medications (Ophthalmic Drugs)  Medication Sig   Bromfenac  Sodium 0.07 % SOLN Place 1 drop into the right eye 2 (two) times daily.   Bromfenac  Sodium 0.09 % SOLN Place 1 drop into the right eye 2 (two) times daily.   dorzolamide -timolol  (COSOPT ) 2-0.5 % ophthalmic solution Place 1 drop into the right eye daily.   dorzolamide -timolol  (COSOPT ) 2-0.5 % ophthalmic solution Place 1 drop into the right eye daily.   Loteprednol  Etabonate (LOTEMAX  SM) 0.38 % GEL Place 1 drop into the right eye 2 (two) times daily.   Loteprednol  Etabonate (LOTEMAX  SM) 0.38 % GEL Place 1 drop into the right eye 2 (two) times daily.   PROLENSA  0.07 % SOLN Place 1 drop into the right eye 2 (two) times daily.   PROLENSA  0.07 % SOLN Place 1 drop into the right eye 2 (two) times daily.   No current facility-administered medications for this visit. (Ophthalmic Drugs)   Current Outpatient Medications (Other)   Medication Sig   apixaban  (ELIQUIS ) 5 MG TABS tablet Take 1 tablet (5 mg total) by mouth 2 (two) times daily.   atorvastatin  (LIPITOR ) 80 MG tablet TAKE 1 TABLET BY MOUTH  DAILY (Patient taking differently: Take 40 mg by mouth at bedtime.)   colesevelam  (WELCHOL ) 625 MG tablet Take 625 mg by mouth 3 (three) times daily.    donepezil  (ARICEPT ) 10 MG tablet Take 10 mg by mouth every morning.    esomeprazole (NEXIUM) 40 MG capsule Take 40 mg by mouth daily.    ferrous sulfate  325 (65 FE) MG tablet TAKE 1 TABLET BY MOUTH EVERY DAY WITH BREAKFAST   gabapentin  (NEURONTIN ) 300 MG capsule Take 300 mg by mouth at bedtime.   GEMTESA 75 MG TABS Take 1 tablet by mouth daily.   insulin  glargine (LANTUS ) 100 UNIT/ML injection Inject 20-40 Units into the skin at bedtime as needed (BGL below 140-150 take 20 units, if 151 or higher take 40 units).   memantine  (NAMENDA ) 10 MG tablet Take 1 tablet (10 mg total) by mouth 2 (two) times daily. Please call and make overdue appt for further refills. 2nd attempt (Patient taking differently: Take 5 mg by mouth 2 (two) times daily. Please call and make overdue appt for further refills. 2nd attempt)  metoprolol  tartrate (LOPRESSOR ) 25 MG tablet TAKE 1 TABLET BY MOUTH  DAILY (Patient taking differently: Take 12.5 mg by mouth 2 (two) times daily.)   nitroGLYCERIN  (NITROSTAT ) 0.4 MG SL tablet Place 1 tablet (0.4 mg total) under the tongue every 5 (five) minutes as needed for up to 25 days for chest pain.   omega-3 acid ethyl esters (LOVAZA ) 1 G capsule Take 2 g by mouth daily.   rOPINIRole  (REQUIP ) 2 MG tablet Take 3-4 mg by mouth daily as needed (RLS).   spironolactone  (ALDACTONE ) 25 MG tablet TAKE ONE-HALF TABLET BY  MOUTH DAILY   tamsulosin  (FLOMAX ) 0.4 MG CAPS capsule Take 0.4 mg by mouth daily after supper.    torsemide  (DEMADEX ) 20 MG tablet Take 1 tablet (20 mg total) by mouth as directed. 2 times a week on Tuesday and Thursday.  Take additional dose as directed with  weight gain of 2 Lbs (Patient taking differently: Take 20 mg by mouth as needed (swelling). 2 times a week on Tuesday and Thursday.  Take additional dose as directed with weight gain of 2 Lbs)   valsartan -hydrochlorothiazide  (DIOVAN -HCT) 80-12.5 MG tablet TAKE 1 TABLET BY MOUTH IN  THE MORNING   vitamin B-12 (CYANOCOBALAMIN ) 1000 MCG tablet Take 1,000 mcg by mouth 2 (two) times daily.   No current facility-administered medications for this visit. (Other)   REVIEW OF SYSTEMS: ROS   Positive for: Gastrointestinal, Musculoskeletal, Endocrine, Cardiovascular, Eyes, Respiratory Negative for: Constitutional, Neurological, Skin, Genitourinary, HENT, Psychiatric, Allergic/Imm, Heme/Lymph Last edited by Alise Appl, COT on 12/18/2023  1:03 PM.      ALLERGIES No Known Allergies  PAST MEDICAL HISTORY Past Medical History:  Diagnosis Date   Acute anterior wall MI ([name]) 03/01/2018   Acute combined systolic and diastolic heart failure ([name]) 03/15/2018   Burn    Chronic diastolic CHF (congestive heart failure) ([name]) 09/21/2018   Defect, retina, with detachment    Dementia ([name])    Diverticulosis    ED (erectile dysfunction)    First degree heart block    GERD (gastroesophageal reflux disease)    Heart murmur    History of blood transfusion    with open heart surgery   Hypertension    cardiologsit-  dr Berry Bristol   Hypertensive retinopathy    OU   Iron deficiency anemia    Mixed hyperlipidemia    Morbid obesity ([name])    Myocardial infarction ([name])    02/28/18   OA (osteoarthritis)    right hip   OSA on CPAP    followed by dr dohmeier, uses CPAP   PAF (paroxysmal atrial fibrillation) ([name])    a. on Eliquis    Prostate cancer ([name])    dx 03-17-2017 (bx)  Stage T2a, Gleason 4+4, PSA  4.43, vol 32.32--  s/p radiatctive prostate seed implants 07-10-2017 then IMRT and ADT   Retinal detachment    Rheg. 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   LIMA to Diagonal Branch   S/P Maze operation for atrial fibrillation 05/30/2018   Complete bilateral atrial lesion set using bipolar radiofrequency and cryothermy ablation with clipping of LA appendage   Scoliosis    Trigger finger    Type 2 diabetes mellitus ([name])    Wears partial dentures    upper and lower   Past Surgical History:  Procedure Laterality Date   AORTIC VALVE REPLACEMENT N/A 05/30/2018   Procedure: AORTIC VALVE REPLACEMENT (AVR) using Inspiris Valve, Size 23;  Surgeon: Gardenia Jump, MD;  Location: [facility] OR;  Service: Open Heart Surgery;  Laterality: N/A;   APPENDECTOMY  1988   BILATERAL TOTAL ETHMOIDECTOMY AND SPHENOIDECTOMY  01-14-2009    dr Tellis Feathers   [facility]   CARDIOVERSION N/A 08/10/2018   Procedure: CARDIOVERSION;  Surgeon: Knox Perl, MD;  Location: [facility] ENDOSCOPY;  Service: Cardiovascular;  Laterality: N/A;   CARPAL TUNNEL RELEASE Bilateral 2013   CATARACT EXTRACTION Bilateral    Dr. Lasandra Points   CATARACT EXTRACTION W/ INTRAOCULAR LENS  IMPLANT, BILATERAL  date?   CORONARY ARTERY BYPASS GRAFT N/A 05/30/2018   CORONARY BALLOON ANGIOPLASTY N/A 03/01/2018   Procedure: CORONARY BALLOON ANGIOPLASTY;  Surgeon: Knox Perl, MD;  Location: [facility] INVASIVE CV LAB;  Service: Cardiovascular;  Laterality: N/A;   CORONARY/GRAFT ACUTE MI REVASCULARIZATION N/A 03/01/2018   Procedure: Coronary/Graft Acute MI Revascularization;  Surgeon: Knox Perl, MD;  Location: [facility] INVASIVE CV LAB;  Service: Cardiovascular;  Laterality: N/A;   CYSTOSCOPY  07/19/2017   Procedure: CYSTOSCOPY;  Surgeon: Bart Born, MD;  Location: [facility];  Service: Urology;;  No seeds found in blader   EYE SURGERY Bilateral    Cat Sx OU.  Dislocated IOL sx OD.  Rheg. RD repair OS.   EYE SURGERY     GAS/FLUID EXCHANGE Right 06/13/2019   Procedure: GAS/FLUID EXCHANGE;   Surgeon: Ronelle Coffee, MD;  Location: [facility] OR;  Service: Ophthalmology;  Laterality: Right;   LAMINECTOMY WITH POSTERIOR LATERAL ARTHRODESIS LEVEL 2 N/A 02/12/2014   Procedure: LUMBAR TWO-THREE,LUIMBAR THREE-FOUR LAMINECTOMY/FORAMINOTOMY;POSSIBLE POSTEROLATERAL ARTHRODESIS WITH AUTOGRAFT;  Surgeon: Adelbert Adler, MD;  Location: [facility] NEURO ORS;  Service: Neurosurgery;  Laterality: N/A;   LEFT HEART CATH AND CORONARY ANGIOGRAPHY N/A 03/01/2018   Procedure: LEFT HEART CATH AND CORONARY ANGIOGRAPHY;  Surgeon: Knox Perl, MD;  Location: [facility] INVASIVE CV LAB;  Service: Cardiovascular;  Laterality: N/A;   MAZE N/A 05/30/2018   Procedure: MAZE;  Surgeon: Gardenia Jump, MD;  Location: [facility] OR;  Service: Open Heart Surgery;  Laterality: N/A;   ORIF RIGHT ANKLE FX'S  05/05/2001   retained hardware   PARS PLANA VITRECTOMY Right 06/13/2019   Procedure: PARS PLANA VITRECTOMY WITH 25 GAUGE WITH LASER AND GAS;  Surgeon: Ronelle Coffee, MD;  Location: [facility] OR;  Service: Ophthalmology;  Laterality: Right;   PARS PLANA VITRECTOMY Right 09/12/2019   Procedure: Pars Plana Vitrectomy With 25 Gauge;  Surgeon: Ronelle Coffee, MD;  Location: [facility] OR;  Service: Ophthalmology;  Laterality: Right;   PERFLUORONE INJECTION Right 06/13/2019   Procedure: PERFLUORONE INJECTION;  Surgeon: Ronelle Coffee, MD;  Location: [facility] OR;  Service: Ophthalmology;  Laterality: Right;   PHOTOCOAGULATION WITH LASER Right 06/13/2019   Procedure: PHOTOCOAGULATION WITH LASER;  Surgeon: Ronelle Coffee, MD;  Location: [facility] OR;  Service: Ophthalmology;  Laterality: Right;   PLACEMENT AND SUTURE OF SECONDARY INTRAOCULAR LENS Right 09/12/2019   Procedure: PLACEMENT AND SUTURE OF SECONDARY INTRAOCULAR LENS;  Surgeon: Ronelle Coffee, MD;  Location: [facility] OR;  Service: Ophthalmology;  Laterality: Right;   PROSTATE BIOPSY  03-17-2017   dr Domingo Friend office   RADIOACTIVE SEED IMPLANT N/A 07/19/2017   Procedure: RADIOACTIVE SEED IMPLANT/BRACHYTHERAPY IMPLANT;  Surgeon: Bart Born, MD;  Location: [facility];  Service: Urology;  Laterality: N/A;  69 seeds implanted   REMOVAL RETAINED LENS Right 09/12/2019   Procedure: REMOVAL RETAINED LENS;  Surgeon: Ronelle Coffee, MD;  Location: [facility] OR;  Service: Ophthalmology;  Laterality: Right;   RETINAL DETACHMENT SURGERY Right 06/13/2019   Dr. Karyl Paget   RIGHT/LEFT HEART CATH AND CORONARY/GRAFT ANGIOGRAPHY N/A 10/26/2021   Procedure: RIGHT/LEFT HEART CATH AND CORONARY/GRAFT ANGIOGRAPHY;  Surgeon: Knox Perl, MD;  Location: [facility] INVASIVE CV LAB;  Service: Cardiovascular;  Laterality: N/A;   SKIN GRAFT     face   SPACE OAR INSTILLATION N/A 07/19/2017   Procedure: SPACE OAR INSTILLATION;  Surgeon: Bart Born, MD;  Location: [facility];  Service: Urology;  Laterality: N/A;   TEE WITHOUT CARDIOVERSION  03/20/2012   Procedure: TRANSESOPHAGEAL ECHOCARDIOGRAM (TEE);  Surgeon: Jessica Morn, MD;  Location: [facility] ENDOSCOPY;  Service: Cardiovascular;  Laterality: N/A;  normal LV; normal EF; normal RV; normal LA w/ left atrial appendage very small, normal function, interatrial septum intact without defect; normal RA; trace MR,TR, & PI; mild AV calcification and senile degeneration w/ mild stenosis, AVA 1.7cm^2;;    TEE WITHOUT CARDIOVERSION N/A 05/30/2018   Procedure: TRANSESOPHAGEAL ECHOCARDIOGRAM (TEE);  Surgeon: Gardenia Jump, MD;  Location: [facility] OR;  Service: Open Heart Surgery;  Laterality: N/A;   TOTAL HIP ARTHROPLASTY Right 10/23/2017   Procedure: TOTAL HIP ARTHROPLASTY ANTERIOR APPROACH;  Surgeon: Wendolyn Hamburger, MD;  Location: [facility] OR;  Service: Orthopedics;  Laterality: Right;   TRANSTHORACIC ECHOCARDIOGRAM  01-11-2017   dr Berry Bristol  (per echo note, no significant change in seveity of AS, no other diagnostic change)   moderate concentric LVH, ef 64%, grade 1 diastolic dysfunction/  moderate LAE/  mild , grade 1 AR w/ moderate AV calcification, mild to moderate restricted AV leaflets w/ moderate AS,  AVA 1.16cm^2, peak grandient , mean grandient 64mmHg/  trace MR, mild calcification MV annulus , mild MV leaflet calcification, mild MVS, peak grandient 4.39mmHg, mean grandient 2.18mmHg/ trace TR   FAMILY HISTORY Family History  Problem Relation Age of Onset   Stroke Mother 39   Hypertension Mother    Hypertension Father    Heart disease Father    Heart attack Father 63   Heart murmur Father    Hypertension Sister    Lung disease Brother    Colon cancer Neg Hx    Stomach cancer Neg Hx    Rectal cancer Neg Hx    Pancreatic cancer Neg Hx    SOCIAL HISTORY Social History   Tobacco Use   Smoking status: Former    Current packs/day: 0.00    Average packs/day: 0.3 packs/day for 1 year (0.3 ttl pk-yrs)    Types: Cigarettes    Start date: 02/03/1966    Quit date: 02/04/1967    Years since quitting: 56.9   Smokeless tobacco: Never   Tobacco comments:    smoked 1 q2-3 days  Vaping Use   Vaping status: Never Used  Substance Use Topics   Alcohol use: Not Currently    Comment: occasional beer   Drug use: Never       OPHTHALMIC EXAM:  Base Eye Exam     Visual Acuity (Snellen - Linear)       Right Left   Dist cc 20/30 -3 20/25 -2   Dist ph cc NI NI         Tonometry (Tonopen, 1:08 PM)       Right Left   Pressure 8 12         Pupils       Pupils Dark Light Shape React APD  Right PERRL 3 2 Round Brisk None   Left PERRL 3 2 Round Brisk None         Visual Fields       Left Right    Full Full         Extraocular Movement       Right Left    Full, Ortho Full, Ortho         Neuro/Psych     Oriented x3: Yes   Mood/Affect: Normal         Dilation     Both eyes: 2.5% Phenylephrine  @ 1:08 PM           Slit Lamp and Fundus Exam     Slit Lamp Exam       Right Left   Lids/Lashes Dermatochalasis - upper lid, Meibomian gland dysfunction Dermatochalasis - upper lid   Conjunctiva/Sclera White and quiet Temporal Pinguecula   Cornea well  healed superior cataract wound, 1+Punctate epithelial erosions, Arcus, tear film debris, decreased TBUT 1+Punctate epithelial erosions, trace fine endo pigment, tear film debris, irregular epi, decreased TBUT   Anterior Chamber deep and clear Deep and quiet   Iris Round and poorly dilated, scattered Transillumination defects 360, +iridodenesis, +pigment deposition, +atrophy Round and dilated   Lens Sutured Akreos IOL well centered 3 piece Posterior chamber intraocular in good position, trace Posterior capsular opacification   Anterior Vitreous post vitrectomy, trace fine pigment Vitreous syneresis, Posterior vitreous detachment         Fundus Exam       Right Left   Disc 1-2+Pallor, Sharp rim 2+Pallor, Sharp rim, mild PPA/PPP, mild tilt, Compact   C/D Ratio 0.3 0.6   Macula Flat, Blunted foveal reflex, mild RPE mottling and clumping, trace central cystic changes -- slightly increased, +pigment deposition on retinal surface -- improved, focal MA nasal fovea, mild ERM Flat, good foveal reflex, Retinal pigment epithelial mottling and clumping, No heme or edema   Vessels attenuated, Tortuous attenuated, Tortuous   Periphery attached, good 360 laser in place, rare MA/DBH; ORIGINALLY: bullous superior retinal detachment from 1000-0130, another more shallow detachment lobe from 130-400, lattice and micro tears ST quadrant; Old retinal tear at 0900 with surrounding laser Attached, peripheral laser scars at 1030, No new RT/RD           Refraction     Wearing Rx       Sphere Cylinder Axis Add   Right -1.25 +1.50 025 +2.75   Left -1.00 +1.75 180 +2.75           IMAGING AND PROCEDURES  Imaging and Procedures for @TODAY @  OCT, Retina - OU - Both Eyes        Right Eye Quality was good. Central Foveal Thickness: 331. Progression has worsened. Findings include normal foveal contour, no IRF, no SRF (Mild interval increase in IRF/cystic changes nasal fovea, trace ERM).   Left Eye Quality  was good. Central Foveal Thickness: 267. Progression has been stable. Findings include normal foveal contour, no IRF, no SRF.   Notes  *Images captured and stored on drive  Diagnosis / Impression:  OD: Interval increase in IRF/cystic changes nasal fovea (+CME), trace ERM OS: NFP, no IRF/SRF   Clinical management:  See below  Abbreviations: NFP - Normal foveal profile. CME - cystoid macular edema. PED - pigment epithelial detachment. IRF - intraretinal fluid. SRF - subretinal fluid. EZ - ellipsoid zone. ERM - epiretinal membrane. ORA - outer retinal atrophy. ORT - outer retinal tubulation.  SRHM - subretinal hyper-reflective material      Injection into Tenon's Capsule - OD - Right Eye       Time Out 12/18/2023. 1:36 PM. Confirmed correct patient, procedure, site, and patient consented.   Anesthesia Topical anesthesia was used. Anesthetic medications included Lidocaine  2%, Proparacaine  0.5%.   Procedure Preparation included 5% betadine to ocular surface. A (25g) needle was used.   Injection: 40 mg triamcinolone  acetonide 40 MG/ML   Route: Other, Site: Right Eye   NDC: [phone], Lot: [id number], Expiration date: 01/28/2025, Waste: 0 mL   Post-op Post injection exam found visual acuity of at least counting fingers. The patient tolerated the procedure well. There were no complications. The patient received written and verbal post procedure care education. Post injection medications were not given.   Notes 1 cc of Kenalog -40 (40 mg) was injected into subtenon's capsule in the superotemporal quadrant. Betadine was applied to Injection area pre and post-injection then rinsed with sterile BSS. 1 drop of polymixin was instilled into the eye. There were no complications. Pt tolerated procedure well.             ASSESSMENT/PLAN:    ICD-10-CM   1. Dislocation of intraocular lens, sequela  T85.22XS     2. Right retinal detachment  H33.21     3. 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 History of repair of retinal tear by laser photocoagulation  Z98.890     5. Diabetes mellitus type 2 without retinopathy ([name])  E11.9     6. Essential hypertension  I10     7. Hypertensive retinopathy of both eyes  H35.033     8. Pseudophakia of both eyes  Z96.1     9. Cystoid macular edema of right eye  H35.351 OCT, Retina - OU - Both Eyes    Injection into Tenon's Capsule - OD - Right Eye    triamcinolone  acetonide (KENALOG -40) injection 40 mg    10. 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 Dislocated IOL OD  - pt with history of partially dislocated 3-piece IOL -- spontaneously dislocated completely into vitreous cavity on 1.23.21  - s/p 25g PPV w/ IOL explantation and secondary sutured IOL OD -- Akreos AO60 lens, 17.5D -- 02.11.2021             - IOL in good position  - 2 superior nylon sutures removed at slit lamp 03.12.21 -- last nylon suture removed at slit lamp, 04.30.21  - corneal edema resolved; PEE improved             - s/p STK OD #1 (07.09.21), #2 (01.26.22), #3 (08.30.22) for CME  - BCVA 20/30 - stable  - OCT Interval increase in IRF/cystic changes nasal fovea, trace ERM (see #9 below)  - IOP 08  - Ats QID OU   - f/u 4 months -- DFE, OCT   2,3. Rhegmatogenous retinal detachment, right eye  - bullous, superior, mac off detachment, onset of foveal involvement 11.11.20 by history  - Bi-lobed superior detachment, superior lobe spanning 1000-0130, nasal lobe 0130-0400  - lattice degeneration with microtears noted at 1030  - s/p PPV/PFC/EL/FAX/14% C3F8 OD, 11.12.20             - intraop: HST at 2 oclock was found; also detachment had progressed to subtotal detachment spanning 9 oclock to 6 oclock (going in clockwise direction); also severe zonular insufficiency with IOL very mobile throughout  case -- did not dislocate completely during the case or immediately post op  - doing well  - retina attached and in good position--good laser in  place  4. History of retinal defects s/p laser retinopexy OU with Dr. Augustus Ledger in 2013  - OD laser at 0900  - OS laser at 1030  - stable  5. Diabetes mellitus, type 2 without retinopathy  - A1c was 6.2 in August 2022  - The incidence, risk factors for progression, natural history and treatment options for diabetic retinopathy  were discussed with patient.    - The need for close monitoring of blood glucose, blood pressure, and serum lipids, avoiding cigarette or any type of tobacco, and the need for long term follow up was also discussed with patient.   - monitor  6,7. Hypertensive retinopathy OU  - discussed importance of tight BP control  - monitor  8. Pseudophakia OU  - s/p CE/IOL OU (Dr. Lasandra Points)  - 3 piece IOL OD was completely displaced into vitreous -- now with sutured Akreos IOL in good position OD -- see above  - OS 3-piece IOL in good position  - monitor  9. CME OD  - post op edema -- recurrent/chronic  - s/p STK OD #1 07.09.21, #2 (01.26.22), #3 (08.24.22)  - OCT OD shows Interval increase in IRF/cystic changes nasal fovea   - pt and wife report improved compliance with Lotemax  and Prolensa  BID OD  - cont Lotemax  SM BID OD and Prolensa  BID OD  - cont Cosopt  daily OD   - recommend STK OD #4 today for increased CME  - pt wishes to proceed with injection  - RBA of procedure discussed, questions answered - STK informed consent obtained and signed, 05.19.25 (OD) - see procedure note  - f/u 2-3 months DFE, OCT  10. Ocular Hypertension OD  - IOP: 08  - continue Cosopt  daily   Ophthalmic Meds Ordered this visit:  Meds ordered this encounter  Medications   DISCONTD: PROLENSA  0.07 % SOLN    Sig: Place 1 drop into the right eye 2 (two) times daily.    Dispense:  6 mL    Refill:  6   DISCONTD: Loteprednol  Etabonate (LOTEMAX  SM) 0.38 % GEL    Sig: Place 1 drop into the right eye 2 (two) times daily.    Dispense:  5 g    Refill:  9   DISCONTD: dorzolamide -timolol   (COSOPT ) 2-0.5 % ophthalmic solution    Sig: Place 1 drop into the right eye daily.    Dispense:  10 mL    Refill:  6   Loteprednol  Etabonate (LOTEMAX  SM) 0.38 % GEL    Sig: Place 1 drop into the right eye 2 (two) times daily.    Dispense:  5 g    Refill:  9   dorzolamide -timolol  (COSOPT ) 2-0.5 % ophthalmic solution    Sig: Place 1 drop into the right eye daily.    Dispense:  10 mL    Refill:  6   PROLENSA  0.07 % SOLN    Sig: Place 1 drop into the right eye 2 (two) times daily.    Dispense:  6 mL    Refill:  6   dorzolamide -timolol  (COSOPT ) 2-0.5 % ophthalmic solution    Sig: Place 1 drop into the right eye daily.    Dispense:  10 mL    Refill:  6   Loteprednol  Etabonate (LOTEMAX  SM) 0.38 % GEL    Sig: Place 1  drop into the right eye 2 (two) times daily.    Dispense:  5 g    Refill:  9   PROLENSA  0.07 % SOLN    Sig: Place 1 drop into the right eye 2 (two) times daily.    Dispense:  6 mL    Refill:  6   Bromfenac  Sodium 0.07 % SOLN    Sig: Place 1 drop into the right eye 2 (two) times daily.    Dispense:  6 mL    Refill:  6   triamcinolone  acetonide (KENALOG -40) injection 40 mg     Return for f/u 2-3 months, CME OD, DFE, OCT.  There are no Patient Instructions on file for this visit.  This document serves as a record of services personally performed by Jeanice Millard, MD, PhD. It was created on their behalf by Mayola Specking, COA an ophthalmic technician. The creation of this record is the provider's dictation and/or activities during the visit.   Electronically signed by: Carrington Clack, COT  12/20/23  12:55 AM   This document serves as a record of services personally performed by Jeanice Millard, MD, PhD. It was created on their behalf by Morley Arabia. Bevin Bucks, OA an ophthalmic technician. The creation of this record is the provider's dictation and/or activities during the visit.    Electronically signed by: Morley Arabia. Bevin Bucks, OA 12/20/23 12:55 AM  Jeanice Millard, M.D.,  Ph.D. Diseases & Surgery of the Retina and Vitreous [facility]  I have reviewed the above documentation for accuracy and completeness, and I agree with the above. Jeanice Millard, M.D., Ph.D. 12/20/23 12:57 AM   Abbreviations: M myopia (nearsighted); A astigmatism; H hyperopia (farsighted); P presbyopia; Mrx spectacle prescription;  CTL contact lenses; OD right eye; OS left eye; OU both eyes  XT exotropia; ET esotropia; PEK punctate epithelial keratitis; PEE punctate epithelial erosions; DES dry eye syndrome; MGD meibomian gland dysfunction; ATs artificial tears; PFAT's preservative free artificial tears; NSC nuclear sclerotic cataract; PSC posterior subcapsular cataract; ERM epi-retinal membrane; PVD posterior vitreous detachment; RD retinal detachment; DM diabetes mellitus; DR diabetic retinopathy; NPDR non-proliferative diabetic retinopathy; PDR proliferative diabetic retinopathy; CSME clinically significant macular edema; DME diabetic macular edema; dbh dot blot hemorrhages; CWS cotton wool spot; POAG primary open angle glaucoma; C/D cup-to-disc ratio; HVF humphrey visual field; GVF goldmann visual field; OCT optical coherence tomography; IOP intraocular pressure; BRVO Branch retinal vein occlusion; CRVO central retinal vein occlusion; CRAO central retinal artery occlusion; BRAO branch retinal artery occlusion; RT retinal tear; SB scleral buckle; PPV pars plana vitrectomy; VH Vitreous hemorrhage; PRP panretinal laser photocoagulation; IVK intravitreal kenalog ; VMT vitreomacular traction; MH Macular hole;  NVD neovascularization of the disc; NVE neovascularization elsewhere; AREDS age related eye disease study; ARMD age related macular degeneration; POAG primary open angle glaucoma; EBMD epithelial/anterior basement membrane dystrophy; ACIOL anterior chamber intraocular lens; IOL intraocular lens; PCIOL posterior chamber intraocular lens; Phaco/IOL phacoemulsification with  intraocular lens placement; PRK photorefractive keratectomy; LASIK laser assisted in situ keratomileusis; HTN hypertension; DM diabetes mellitus; COPD chronic obstructive pulmonary disease

## 2023-12-18 ENCOUNTER — Encounter (INDEPENDENT_AMBULATORY_CARE_PROVIDER_SITE_OTHER): Payer: Self-pay | Admitting: Ophthalmology

## 2023-12-18 ENCOUNTER — Ambulatory Visit (INDEPENDENT_AMBULATORY_CARE_PROVIDER_SITE_OTHER): Payer: Medicare Other | Admitting: Ophthalmology

## 2023-12-18 DIAGNOSIS — H35411 Lattice degeneration of retina, right eye: Secondary | ICD-10-CM

## 2023-12-18 DIAGNOSIS — Z961 Presence of intraocular lens: Secondary | ICD-10-CM

## 2023-12-18 DIAGNOSIS — I10 Essential (primary) hypertension: Secondary | ICD-10-CM | POA: Diagnosis not present

## 2023-12-18 DIAGNOSIS — H3321 Serous retinal detachment, right eye: Secondary | ICD-10-CM

## 2023-12-18 DIAGNOSIS — H35033 Hypertensive retinopathy, bilateral: Secondary | ICD-10-CM | POA: Diagnosis not present

## 2023-12-18 DIAGNOSIS — T8522XS Displacement of intraocular lens, sequela: Secondary | ICD-10-CM

## 2023-12-18 DIAGNOSIS — H35351 Cystoid macular degeneration, right eye: Secondary | ICD-10-CM

## 2023-12-18 DIAGNOSIS — H40051 Ocular hypertension, right eye: Secondary | ICD-10-CM

## 2023-12-18 DIAGNOSIS — Z9889 Other specified postprocedural states: Secondary | ICD-10-CM

## 2023-12-18 DIAGNOSIS — E119 Type 2 diabetes mellitus without complications: Secondary | ICD-10-CM

## 2023-12-18 MED ORDER — PROLENSA 0.07 % OP SOLN: 1.0000 [drp] | Freq: Two times a day (BID) | OPHTHALMIC | 6 refills | Status: AC

## 2023-12-18 MED ORDER — DORZOLAMIDE HCL-TIMOLOL MAL 2-0.5 % OP SOLN: 1.0000 [drp] | Freq: Every day | OPHTHALMIC | 6 refills | Status: AC

## 2023-12-18 MED ORDER — BROMFENAC SODIUM 0.07 % OP SOLN: 1.0000 [drp] | Freq: Two times a day (BID) | OPHTHALMIC | 6 refills | Status: DC

## 2023-12-18 MED ORDER — DORZOLAMIDE HCL-TIMOLOL MAL 2-0.5 % OP SOLN: 1.0000 [drp] | Freq: Every day | OPHTHALMIC | 6 refills | Status: DC

## 2023-12-18 MED ORDER — TRIAMCINOLONE ACETONIDE 40 MG/ML IJ SUSP FOR KALEIDOSCOPE
40.0000 mg | INTRAMUSCULAR | Status: AC | PRN
Start: 2023-12-18 — End: 2023-12-18
  Administered 2023-12-18: 40 mg

## 2023-12-18 MED ORDER — LOTEMAX SM 0.38 % OP GEL: 1.0000 [drp] | Freq: Two times a day (BID) | OPHTHALMIC | 9 refills | Status: AC

## 2023-12-18 MED ORDER — LOTEMAX SM 0.38 % OP GEL: 1.0000 [drp] | Freq: Two times a day (BID) | OPHTHALMIC | 9 refills | Status: DC

## 2023-12-18 MED ORDER — PROLENSA 0.07 % OP SOLN: 1.0000 [drp] | Freq: Two times a day (BID) | OPHTHALMIC | 6 refills | Status: DC

## 2024-01-03 ENCOUNTER — Other Ambulatory Visit: Payer: Self-pay

## 2024-01-03 DIAGNOSIS — R0609 Other forms of dyspnea: Secondary | ICD-10-CM

## 2024-01-09 ENCOUNTER — Encounter: Payer: Self-pay | Admitting: Internal Medicine

## 2024-01-09 ENCOUNTER — Ambulatory Visit (INDEPENDENT_AMBULATORY_CARE_PROVIDER_SITE_OTHER): Admitting: Internal Medicine

## 2024-01-09 ENCOUNTER — Ambulatory Visit: Admitting: Internal Medicine

## 2024-01-09 VITALS — BP 118/68 | HR 65 | Ht 68.0 in | Wt 197.0 lb

## 2024-01-09 DIAGNOSIS — R918 Other nonspecific abnormal finding of lung field: Secondary | ICD-10-CM | POA: Diagnosis not present

## 2024-01-09 DIAGNOSIS — F17210 Nicotine dependence, cigarettes, uncomplicated: Secondary | ICD-10-CM

## 2024-01-09 DIAGNOSIS — Z8669 Personal history of other diseases of the nervous system and sense organs: Secondary | ICD-10-CM

## 2024-01-09 DIAGNOSIS — I050 Rheumatic mitral stenosis: Secondary | ICD-10-CM

## 2024-01-09 DIAGNOSIS — R0609 Other forms of dyspnea: Secondary | ICD-10-CM

## 2024-01-09 DIAGNOSIS — Z862 Personal history of diseases of the blood and blood-forming organs and certain disorders involving the immune mechanism: Secondary | ICD-10-CM

## 2024-01-09 DIAGNOSIS — Z836 Family history of other diseases of the respiratory system: Secondary | ICD-10-CM

## 2024-01-09 LAB — PULMONARY FUNCTION TEST
DL/VA % pred: 71 %
DL/VA: 2.77 ml/min/mmHg/L
DLCO unc % pred: 63 %
DLCO unc: 15.01 ml/min/mmHg
FEF 25-75 Pre: 1.96 L/s
FEF2575-%Pred-Pre: 107 %
FEV1-%Pred-Pre: 95 %
FEV1-Pre: 2.57 L
FEV1FVC-%Pred-Pre: 104 %
FEV6-%Pred-Pre: 95 %
FEV6-Pre: 3.39 L
FEV6FVC-%Pred-Pre: 106 %
FVC-%Pred-Pre: 89 %
FVC-Pre: 3.43 L
Pre FEV1/FVC ratio: 75 %
Pre FEV6/FVC Ratio: 99 %

## 2024-01-09 LAB — CBC WITH DIFFERENTIAL/PLATELET
Basophils Absolute: 0 10*3/uL (ref 0.0–0.1)
Basophils Relative: 0.8 % (ref 0.0–3.0)
Eosinophils Absolute: 0.2 10*3/uL (ref 0.0–0.7)
Eosinophils Relative: 2.8 % (ref 0.0–5.0)
HCT: 34 % — ABNORMAL LOW (ref 39.0–52.0)
Hemoglobin: 11.4 g/dL — ABNORMAL LOW (ref 13.0–17.0)
Lymphocytes Relative: 24.8 % (ref 12.0–46.0)
Lymphs Abs: 1.5 10*3/uL (ref 0.7–4.0)
MCHC: 33.5 g/dL (ref 30.0–36.0)
MCV: 87.1 fl (ref 78.0–100.0)
Monocytes Absolute: 0.5 10*3/uL (ref 0.1–1.0)
Monocytes Relative: 8.2 % (ref 3.0–12.0)
Neutro Abs: 3.8 10*3/uL (ref 1.4–7.7)
Neutrophils Relative %: 63.4 % (ref 43.0–77.0)
Platelets: 190 10*3/uL (ref 150.0–400.0)
RBC: 3.91 Mil/uL — ABNORMAL LOW (ref 4.22–5.81)
RDW: 14.3 % (ref 11.5–15.5)
WBC: 6 10*3/uL (ref 4.0–10.5)

## 2024-01-09 NOTE — Progress Notes (Signed)
Spiro/DLCO performed today. 

## 2024-01-09 NOTE — Progress Notes (Signed)
 eferred in December 2021 for groundglass opacity by Rosslyn Coons, MD    This is a 83 year old gentleman with a past medical history of MI, chronic systolic and diastolic heart failure, prior CABG plus valve in 2019 by Dr. Alva Jewels.  Hypertension, GERD, diabetes.  Patient had a recent CT scan of the chest on 06/11/2020 which revealed a 2.8 x 2.5 groundglass opacity within the left lower lobe concerning for a potential underlying primary lung cancer such as a lipidic adenocarcinoma.  Patient was seen by cardiothoracic surgery, Dr. Deloise Ferries and referred to us  for image follow-up and consideration for bronchoscopy if needed.  Patient denies weight loss, shortness of breath neurologic symptoms or hemoptysis.  He still occasionally smokes a pipe.    09/21/2020: Here today for follow-up regarding the CT scan chest.  Recent CT chest reveals resolution of previously rounded groundglass opacity that was seen in December.  However does show lower lobe areas of septal thickening which may be related to scarring from previous surgeries.  This in conjunction with his prior pulmonary function test.  Patient's PFTs were completed in August 2019 which revealed a reduced DLCO of 39% predicted a normal ratio but also a reduced FEV1 and FVC.  Due to the abnormalities consistent with significant restriction present we will have a HRCT complete for next follow-up of nodule.  We reviewed this today in the office with patient.  From a respiratory standpoint he has no complaints today.  07/21/2021: Here today for follow-up regarding HRCT imaging of the chest.  Patient had CT imagingFor follow-up of basilar parenchymal lung changes concerning for interstitial lung disease.  Patient had mild pattern of pulmonary fibrosis that had slightly worsened in comparison to his February CT categorized as early indeterminant UIP however the progression per reading was concerning for UIP.  Of note he has had progressive shortness of  breath with exertion.   OV 09/03/2021 -transfer of care from Dr. Dariel Edelson Icard to Dr. Bertrum Brodie in the ILD center.  Subjective:  Patient ID: Cody Phelps, male , DOB: [birth year] , age 83 y.o. , MRN: [id number] , ADDRESS: [address] [place] [address] PCP Rosslyn Coons, MD Patient Care Team: Rosslyn Coons, MD as PCP - General (Endocrinology)  This Provider for this visit: Treatment Team:  Attending Provider: Maire Scot, MD    09/03/2021 -history is given by the wife and also the patient and mostly from the records.  Patient gives the least amount of history. Chief Complaint  Patient presents with   Follow-up    ILD eval per Dr. Thelda Finney.  Pt states that he does have an occasional cough and also has SOB when he exerts himself. Pt's spouse states that pt has had a couple spells to where pt felt like he was going to pass out while sitting at the table.     HPI Cody Phelps 83 y.o. -has been following with Dr. Dariel Edelson Icard since late 2021 for lung nodule.  It appears by early this year the lung nodule had resolved.  However in the interim he has developed shortness of breath.  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  Therefore he is here.  In talking to the wife he tells me that for the last few months he has had insidious onset of shortness of breath particularly walking to the kitchen from the bathroom or from the bedroom he would get shortness of breath.  Improved by rest.  There is no associated chest pain.  She feels  it is getting worse.  Then subsequently this week on 08/31/2021 Tuesday while sitting in the kitchen table he became ashen white and became dizzy.  But then this resolved shortly thereafter.  The same thing happened the following day on 09/01/2021.  There was dizziness.  But this time he did not turn pale.  They are denying any cough.  No chest pain no paroxysmal nocturnal dyspnea no orthopnea.  He has some basilar edema that is mild and fluctuates and there is no change in  that.  He is known to have sleep apnea for which he is on CPAP.  He uses a cane because of severe back pain.  He is obese.  He is on gabapentin .  He is on PPI treatment.  He is on amiodarone .  He has significant heart disease including atrial flutter.  He is on anticoagulation.  His cardiologist is Dr. Berry Bristol.  He has upcoming appointment later this month.  Chart review also shows he has chronic anemia with a hemoglobin between 9 and 10 g% for which he is on the iron tablets.  Despite the symptoms his walking desaturation test he was normal.  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  Radiologist also described progression.  However in my personal visualization the severity of ILD is very mild.  Progressive ILD could contribute or be associated with progressive shortness of breath but in my visualization again the ILD is minimal.  Paradoxically his pulmonary function test shows improvement.     CT Chest data HRCT 07/13/21  Narrative & Impression  CLINICAL DATA:  Interstitial lung disease   EXAM: CT CHEST WITHOUT CONTRAST   TECHNIQUE: Multidetector CT imaging of the chest was performed following the standard protocol without intravenous contrast. High resolution imaging of the lungs, as well as inspiratory and expiratory imaging, was performed.   COMPARISON:  09/17/2020, 06/11/2020   FINDINGS: Cardiovascular: Aortic atherosclerosis. Status post median sternotomy and aortic valve replacement. Cardiomegaly. Extensive 3 vessel coronary artery calcifications and/or stents. Left atrial appendage clip. No pericardial effusion.   Mediastinum/Nodes: No enlarged mediastinal, hilar, or axillary lymph nodes. Small hiatal hernia. Thyroid  gland, trachea, and esophagus demonstrate no significant findings.   Lungs/Pleura: There is mild pulmonary fibrosis in a pattern with apical to  basal gradient, featuring irregular peripheral interstitial opacity and septal thickening. No clear evidence of subpleural bronchiolectasis. These findings are slightly worsened in comparison to prior examination dated 09/17/2020. Lobular air trapping on expiratory phase imaging. No pleural effusion or pneumothorax.   Upper Abdomen: No acute abnormality.   Musculoskeletal: No chest wall mass or suspicious bone lesions identified.   IMPRESSION: 1. There is mild pulmonary fibrosis in a pattern with apical to basal gradient, featuring irregular peripheral interstitial opacity and septal thickening. No clear evidence of subpleural bronchiolectasis. These findings are slightly worsened in comparison to prior examination dated 09/17/2020. Strictly characterized, findings are (early) indeterminate for UIP per consensus guidelines, however progression is worrisome for UIP: Diagnosis of Idiopathic Pulmonary Fibrosis: An Official ATS/ERS/JRS/ALAT Clinical Practice Guideline. Am Annie Barton Crit Care Med Vol 198, Iss 5, [phone], Apr 01 2017. 2. Lobular air trapping on expiratory phase imaging, suggestive of small airways disease. 3. Cardiomegaly and coronary artery disease.   Aortic Atherosclerosis (ICD10-I70.0).     Electronically Signed   By: Fredricka Jenny M.D.   On: 07/14/2021 13:53      OV 01/09/2024  Subjective:  Patient ID: Cody Phelps, male , DOB: [birth year] , age 83 y.o. , MRN: [id number] , ADDRESS: [address] [place] [address] PCP Rosslyn Coons, MD Patient Care Team: Rosslyn Coons, MD as PCP - General (Endocrinology) Knox Perl, MD as PCP - Cardiology (Cardiology) Maire Scot, MD as Consulting Physician (Pulmonary Disease)  This Provider for this visit: Treatment Team:  Attending Provider: Maire Scot, MD    01/09/2024 -   Chief Complaint  Patient presents with   Follow-up    Follow up  breathing is okay      HPI Cody Phelps 83 y.o.  -shortness of breath in the presence of interstitial lung abnormalities and family history of pulmonary fibrosis and anemia.  Here for follow-up.  He is accompanied by his daughter Bridgette Campus who lives nearby and his wife.  He and his wife live in [place].  Have not seen him in just over 2 years.  They say in these 2 years his mobility has gotten worse he is now using a scooter because of the scooter his shortness of breath is Mast.  But they believe he still has exertional dyspnea that might be a little worse than a few years ago but he does not experience it because of the scooter use.  He is able to take a shower on his own but because of his mobility and balance and overall weakness his wife helps with changing clothes.  In the last 6 months no hospitalizations no ER visits but he did have a detached retina injection for seizure.  He uses CPAP for his sleep apnea.  He has baseline anemia which they believe is stable.  Review of the labs indicate that hemoglobin was 10 g% in 2024 Echocardiogram last was over 2 years ago with mitral stenosis and bioprosthetic aortic valve CT chest was also over 2 years ago coronary December 2022 there is description of ILA but he did have a CT angiogram chest in early 2023 in which there is no comment made.  He did walk test today and is stable.  Pulmonary function test today shows overall improvement.  But there is isolated reduction in DLCO that could either be because of anemia or ILD or both.   SYMPTOM SCALE - ILD 01/09/2024  Current weight   O2 use ra  Shortness of Breath 0 -> 5 scale with 5 being worst (score 6 If unable to do)  At rest 0  Simple tasks - showers, clothes change, eating, shaving 1  Household (dishes, doing bed, laundry) 0  Shopping 2  Walking level at own pace 1  Walking up Stairs 3  Total (30-36) Dyspnea Score 7  How bad is your cough? 0  How bad is your fatigue 3  How bad is nausea 0  How bad is vomiting?  0  How bad is diarrhea? 3   How bad is anxiety? 1  How bad is depression 0  Any chronic pain - if so where and how bad 0      PFT     Latest Ref Rng & Units 01/09/2024    1:21 PM 08/23/2021    3:04 PM 03/19/2018    3:57 PM  PFT Results  FVC-Pre L 3.43  P 3.10  1.73   FVC-Predicted Pre % 89  P 78  42   FVC-Post L  3.29  1.65   FVC-Predicted Post %  83  40   Pre FEV1/FVC % % 75  P  76  80   Post FEV1/FCV % %  79  87   FEV1-Pre L 2.57  P 2.36  1.38   FEV1-Predicted Pre % 95  P 83  47   FEV1-Post L  2.59  1.43   DLCO uncorrected ml/min/mmHg 15.01  P 14.12  12.22   DLCO UNC% % 63  P 58  39   DLCO corrected ml/min/mmHg  17.05  15.93   DLCO COR %Predicted %  71  51   DLVA Predicted % 71  P 83  102   TLC L  5.84  4.09   TLC % Predicted %  85  59   RV % Predicted %  77  90     P Preliminary result     Simple office walk 185 feet x  3 laps goal with forehead probe 09/03/2021  01/09/2024   O2 used ra Ra   Number laps completed 3 attempt but only did 1.5l laps Did 1 lap of 3 with cane  Comments about pace Slow pace with ane Slow pace  Resting Pulse Ox/HR 100% and 63/min 97% and  Final Pulse Ox/HR 100% and 72/min 96% and   Desaturated </= 88% no   Desaturated <= 3% points no   Got Tachycardic >/= 90/min no   Symptoms at end of test Mild dyspnea Mild dyspnea at end  Miscellaneous comments Stopped due to gait issues x     LAB RESULTS last 96 hours No results found.    Latest Reference Range & Units 08/05/08 12:37 01/07/09 10:30 02/03/14 10:43 07/03/17 11:02 10/11/17 14:55 10/24/17 04:38 10/25/17 04:03 03/01/18 05:14 03/01/18 05:47 03/01/18 07:38 03/02/18 05:25 03/03/18 03:43 03/15/18 17:53 03/18/18 03:30 03/19/18 10:43 04/30/18 14:42 05/21/18 15:45 05/28/18 13:45 05/30/18 08:08 05/30/18 09:59 05/30/18 10:18 05/30/18 11:13 05/30/18 12:14 05/30/18 12:30 05/30/18 13:36 05/30/18 14:13 05/30/18 15:04 05/30/18 15:05 05/30/18 20:58 05/30/18 21:01 05/31/18 03:54 05/31/18 18:12 06/01/18 03:17 06/02/18 07:24 01/29/19  12:26 02/26/19 14:40 05/28/19 09:47 06/13/19 11:36 08/29/19 08:44 09/12/19 09:56 01/01/20 14:42 07/02/20 09:34 09/15/20 11:54 12/31/20 09:26 08/10/21 14:26 09/03/21 14:54 09/03/21 18:28 10/20/21 16:07 10/26/21 10:18 10/26/21 10:25 10/26/21 10:28 02/08/22 09:38 08/17/22 10:09  Hemoglobin 13.0 - 17.0 g/dL 16.1 09.6 04.5 (L) 40.9 (L) 10.9 (L) 8.6 (L) 8.6 (L) 9.5 (L) 9.5 (L) 10.0 (L) 8.4 (L) 8.6 (L) 8.2 (L) 8.0 (L) 8.4 (L) 9.2 (L) 10.6 (L) 9.0 (L) 8.2 (L) 7.5 (L) 7.8 (L) 7.8 (L) 8.5 (L) 7.8 (L) 7.8 (L) 8.2 (L) 8.5 (L) 8.8 (L) 10.8 (L) 9.9 (L) 9.7 (L) 9.3 (L) 9.0 (L) 8.4 (L) 8.3 (L) 9.5 (L) 9.5 (L) 9.7 (L) 11.0 (L) 10.6 (L) 10.2 (L) 9.5 (L) 10.6 (L) 9.5 (L) 9.7 (L) 10.6 (L) 10.9 (L) 11.0 (L) 9.9 (L) 9.9 (L) 9.2 (L) 9.2 (L) 10.9 (L)  (L): Data is abnormally low    has a past medical history of Acute anterior wall MI ([name]) (03/01/2018), Acute combined systolic and diastolic heart failure ([name]) (03/11/9146), Burn, Chronic diastolic CHF (congestive heart failure) ([name]) (09/21/2018), Defect, retina, with detachment, Dementia ([name]), Diverticulosis, ED (erectile dysfunction), First degree heart block, GERD (gastroesophageal reflux disease), Heart murmur, History of blood transfusion, Hypertension, Hypertensive retinopathy, Iron deficiency anemia, Mixed hyperlipidemia, Morbid obesity ([name]), Myocardial infarction ([name]), OA (osteoarthritis), OSA on CPAP, PAF (paroxysmal atrial fibrillation) ([name]), Prostate cancer ([name]), Retinal detachment, RLS (restless legs syndrome), 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   reports that he quit smoking about 56 years ago. His smoking use included cigarettes. He started smoking about 57 years ago. He has a 0.3 pack-year smoking history. He has never used smokeless tobacco.  Past Surgical  History:  Procedure Laterality Date   AORTIC VALVE REPLACEMENT N/A 05/30/2018   Procedure: AORTIC VALVE REPLACEMENT (AVR) using Inspiris Valve, Size 23;  Surgeon: Gardenia Jump, MD;  Location: [facility] OR;  Service: Open Heart Surgery;  Laterality: N/A;   APPENDECTOMY  1988   BILATERAL TOTAL ETHMOIDECTOMY AND SPHENOIDECTOMY  01-14-2009    dr Tellis Feathers   [facility]   CARDIOVERSION N/A 08/10/2018   Procedure: CARDIOVERSION;  Surgeon: Knox Perl, MD;  Location: [facility] ENDOSCOPY;  Service: Cardiovascular;  Laterality: N/A;   CARPAL TUNNEL RELEASE Bilateral 2013   CATARACT EXTRACTION Bilateral    Dr. Lasandra Points   CATARACT EXTRACTION W/ INTRAOCULAR LENS  IMPLANT, BILATERAL  date?   CORONARY ARTERY BYPASS GRAFT N/A 05/30/2018   CORONARY BALLOON ANGIOPLASTY N/A 03/01/2018   Procedure: CORONARY BALLOON ANGIOPLASTY;  Surgeon: Knox Perl, MD;  Location: [facility] INVASIVE CV LAB;  Service: Cardiovascular;  Laterality: N/A;   CORONARY/GRAFT ACUTE MI REVASCULARIZATION N/A 03/01/2018   Procedure: Coronary/Graft Acute MI Revascularization;  Surgeon: Knox Perl, MD;  Location: [facility] INVASIVE CV LAB;  Service: Cardiovascular;  Laterality: N/A;   CYSTOSCOPY  07/19/2017   Procedure: CYSTOSCOPY;  Surgeon: Bart Born, MD;  Location: [facility];  Service: Urology;;  No seeds found in blader   EYE SURGERY Bilateral    Cat Sx OU.  Dislocated IOL sx OD.  Rheg. RD repair OS.   EYE SURGERY     GAS/FLUID EXCHANGE Right 06/13/2019   Procedure: GAS/FLUID EXCHANGE;  Surgeon: Ronelle Coffee, MD;  Location: [facility] OR;  Service: Ophthalmology;  Laterality: Right;   LAMINECTOMY WITH POSTERIOR LATERAL ARTHRODESIS LEVEL 2 N/A 02/12/2014   Procedure: LUMBAR TWO-THREE,LUIMBAR THREE-FOUR LAMINECTOMY/FORAMINOTOMY;POSSIBLE POSTEROLATERAL ARTHRODESIS WITH AUTOGRAFT;  Surgeon: Adelbert Adler, MD;  Location: [facility] NEURO ORS;  Service: Neurosurgery;  Laterality: N/A;   LEFT HEART CATH AND CORONARY ANGIOGRAPHY N/A 03/01/2018   Procedure: LEFT HEART CATH  AND CORONARY ANGIOGRAPHY;  Surgeon: Knox Perl, MD;  Location: [facility] INVASIVE CV LAB;  Service: Cardiovascular;  Laterality: N/A;   MAZE N/A 05/30/2018   Procedure: MAZE;  Surgeon: Gardenia Jump, MD;  Location[facility] OR;  Service: Open Heart Surgery;  Laterality: N/A;   ORIF RIGHT ANKLE FX'S  05/05/2001   retained hardware   PARS PLANA VITRECTOMY Right 06/13/2019   Procedure: PARS PLANA VITRECTOMY WITH 25 GAUGE WITH LASER AND GAS;  Surgeon: Ronelle Coffee, MD;  Location: [facility] OR;  Service: Ophthalmology;  Laterality: Right;   PARS PLANA VITRECTOMY Right 09/12/2019   Procedure: Pars Plana Vitrectomy With 25 Gauge;  Surgeon: Ronelle Coffee, MD;  Location: [facility] OR;  Service: Ophthalmology;  Laterality: Right;   PERFLUORONE INJECTION Right 06/13/2019   Procedure: PERFLUORONE INJECTION;  Surgeon: Ronelle Coffee, MD;  Location: [facility] OR;  Service: Ophthalmology;  Laterality: Right;   PHOTOCOAGULATION WITH LASER Right 06/13/2019   Procedure: PHOTOCOAGULATION WITH LASER;  Surgeon: Ronelle Coffee, MD;  Location: [facility] OR;  Service: Ophthalmology;  Laterality: Right;   PLACEMENT AND SUTURE OF SECONDARY INTRAOCULAR LENS Right 09/12/2019   Procedure: PLACEMENT AND SUTURE OF SECONDARY INTRAOCULAR LENS;  Surgeon: Ronelle Coffee, MD;  Location: [facility] OR;  Service: Ophthalmology;  Laterality: Right;   PROSTATE BIOPSY  03-17-2017   dr Domingo Friend  office   RADIOACTIVE SEED IMPLANT N/A 07/19/2017   Procedure: RADIOACTIVE SEED IMPLANT/BRACHYTHERAPY IMPLANT;  Surgeon: Bart Born, MD;  Location: [facility];  Service: Urology;  Laterality: N/A;  69 seeds implanted   REMOVAL RETAINED LENS Right 09/12/2019   Procedure: REMOVAL RETAINED LENS;  Surgeon: Ronelle Coffee, MD;  Location: [facility] OR;  Service: Ophthalmology;  Laterality: Right;   RETINAL DETACHMENT SURGERY Right 06/13/2019   Dr. Karyl Paget   RIGHT/LEFT HEART CATH AND CORONARY/GRAFT ANGIOGRAPHY N/A 10/26/2021   Procedure: RIGHT/LEFT HEART CATH AND CORONARY/GRAFT  ANGIOGRAPHY;  Surgeon: Knox Perl, MD;  Location: [facility] INVASIVE CV LAB;  Service: Cardiovascular;  Laterality: N/A;   SKIN GRAFT     face   SPACE OAR INSTILLATION N/A 07/19/2017   Procedure: SPACE OAR INSTILLATION;  Surgeon: Bart Born, MD;  Location: [facility];  Service: Urology;  Laterality: N/A;   TEE WITHOUT CARDIOVERSION  03/20/2012   Procedure: TRANSESOPHAGEAL ECHOCARDIOGRAM (TEE);  Surgeon: Jessica Morn, MD;  Location: [facility] ENDOSCOPY;  Service: Cardiovascular;  Laterality: N/A;  normal LV; normal EF; normal RV; normal LA w/ left atrial appendage very small, normal function, interatrial septum intact without defect; normal RA; trace MR,TR, & PI; mild AV calcification and senile degeneration w/ mild stenosis, AVA 1.7cm^2;;    TEE WITHOUT CARDIOVERSION N/A 05/30/2018   Procedure: TRANSESOPHAGEAL ECHOCARDIOGRAM (TEE);  Surgeon: Gardenia Jump, MD;  Location: [facility] OR;  Service: Open Heart Surgery;  Laterality: N/A;   TOTAL HIP ARTHROPLASTY Right 10/23/2017   Procedure: TOTAL HIP ARTHROPLASTY ANTERIOR APPROACH;  Surgeon: Wendolyn Hamburger, MD;  Location: [facility] OR;  Service: Orthopedics;  Laterality: Right;   TRANSTHORACIC ECHOCARDIOGRAM  01-11-2017   dr Berry Bristol  (per echo note, no significant change in seveity of AS, no other diagnostic change)   moderate concentric LVH, ef 64%, grade 1 diastolic dysfunction/  moderate LAE/  mild , grade 1 AR w/ moderate AV calcification, mild to moderate restricted AV leaflets w/ moderate AS, AVA 1.16cm^2, peak grandient , mean grandient 30mmHg/  trace MR, mild calcification MV annulus , mild MV leaflet calcification, mild MVS, peak grandient 4.62mmHg, mean grandient 2.3mmHg/ trace TR    No Known Allergies  Immunization History  Administered Date(s) Administered   Fluad Quad(high Dose 65+) 03/26/2019   Influenza, High Dose Seasonal PF 05/08/2015, 05/01/2017, 05/11/2018, 04/20/2020, 04/01/2021   PFIZER(Purple Top)SARS-COV-2 Vaccination  08/21/2019, 09/09/2019, 05/13/2020   Tdap 10/29/2018   Zoster Recombinant(Shingrix) 12/14/2017, 08/03/2018    Family History  Problem Relation Age of Onset   Stroke Mother 77   Hypertension Mother    Hypertension Father    Heart disease Father    Heart attack Father 6   Heart murmur Father    Hypertension Sister    Lung disease Brother    Colon cancer Neg Hx    Stomach cancer Neg Hx    Rectal cancer Neg Hx    Pancreatic cancer Neg Hx      Current Outpatient Medications:    apixaban  (ELIQUIS ) 5 MG TABS tablet, Take 1 tablet (5 mg total) by mouth 2 (two) times daily., Disp: 180 tablet, Rfl: 3   atorvastatin  (LIPITOR ) 80 MG tablet, TAKE 1 TABLET BY MOUTH  DAILY (Patient taking differently: Take 40 mg by mouth at bedtime.), Disp: 90 tablet, Rfl: 3   Bromfenac  Sodium 0.07 % SOLN, Place 1 drop into the right eye 2 (two) times daily., Disp: 6 mL, Rfl: 6   Bromfenac  Sodium 0.09 % SOLN, Place 1 drop  into the right eye 2 (two) times daily., Disp: 3.4 mL, Rfl: 9   colesevelam  (WELCHOL ) 625 MG tablet, Take 625 mg by mouth 3 (three) times daily. , Disp: , Rfl:    donepezil  (ARICEPT ) 10 MG tablet, Take 10 mg by mouth every morning. , Disp: , Rfl:    dorzolamide -timolol  (COSOPT ) 2-0.5 % ophthalmic solution, Place 1 drop into the right eye daily., Disp: 10 mL, Rfl: 6   dorzolamide -timolol  (COSOPT ) 2-0.5 % ophthalmic solution, Place 1 drop into the right eye daily., Disp: 10 mL, Rfl: 6   esomeprazole (NEXIUM) 40 MG capsule, Take 40 mg by mouth daily. , Disp: , Rfl:    ferrous sulfate  325 (65 FE) MG tablet, TAKE 1 TABLET BY MOUTH EVERY DAY WITH BREAKFAST, Disp: 90 tablet, Rfl: 3   gabapentin  (NEURONTIN ) 300 MG capsule, Take 300 mg by mouth at bedtime., Disp: , Rfl:    GEMTESA 75 MG TABS, Take 1 tablet by mouth daily., Disp: , Rfl:    insulin  glargine (LANTUS ) 100 UNIT/ML injection, Inject 20-40 Units into the skin at bedtime as needed (BGL below 140-150 take 20 units, if 151 or higher take 40  units)., Disp: , Rfl:    Loteprednol  Etabonate (LOTEMAX  SM) 0.38 % GEL, Place 1 drop into the right eye 2 (two) times daily., Disp: 5 g, Rfl: 9   Loteprednol  Etabonate (LOTEMAX  SM) 0.38 % GEL, Place 1 drop into the right eye 2 (two) times daily., Disp: 5 g, Rfl: 9   memantine  (NAMENDA ) 10 MG tablet, Take 1 tablet (10 mg total) by mouth 2 (two) times daily. Please call and make overdue appt for further refills. 2nd attempt (Patient taking differently: Take 5 mg by mouth 2 (two) times daily. Please call and make overdue appt for further refills. 2nd attempt), Disp: 30 tablet, Rfl: 0   metoprolol  tartrate (LOPRESSOR ) 25 MG tablet, TAKE 1 TABLET BY MOUTH  DAILY (Patient taking differently: Take 12.5 mg by mouth 2 (two) times daily.), Disp: 90 tablet, Rfl: 3   omega-3 acid ethyl esters (LOVAZA ) 1 G capsule, Take 2 g by mouth daily., Disp: , Rfl:    PROLENSA  0.07 % SOLN, Place 1 drop into the right eye 2 (two) times daily., Disp: 6 mL, Rfl: 6   PROLENSA  0.07 % SOLN, Place 1 drop into the right eye 2 (two) times daily., Disp: 6 mL, Rfl: 6   rOPINIRole  (REQUIP ) 2 MG tablet, Take 3-4 mg by mouth daily as needed (RLS)., Disp: , Rfl:    spironolactone  (ALDACTONE ) 25 MG tablet, TAKE ONE-HALF TABLET BY  MOUTH DAILY, Disp: 45 tablet, Rfl: 3   tamsulosin  (FLOMAX ) 0.4 MG CAPS capsule, Take 0.4 mg by mouth daily after supper. , Disp: , Rfl:    torsemide  (DEMADEX ) 20 MG tablet, Take 1 tablet (20 mg total) by mouth as directed. 2 times a week on Tuesday and Thursday.  Take additional dose as directed with weight gain of 2 Lbs, Disp: 90 tablet, Rfl: 1   valsartan -hydrochlorothiazide  (DIOVAN -HCT) 80-12.5 MG tablet, TAKE 1 TABLET BY MOUTH IN  THE MORNING, Disp: 90 tablet, Rfl: 3   vitamin B-12 (CYANOCOBALAMIN ) 1000 MCG tablet, Take 1,000 mcg by mouth 2 (two) times daily., Disp: , Rfl:    nitroGLYCERIN  (NITROSTAT ) 0.4 MG SL tablet, Place 1 tablet (0.4 mg total) under the tongue every 5 (five) minutes as needed for up to 25  days for chest pain., Disp: 25 tablet, Rfl: 3      Objective:   Vitals:  01/09/24 1402  BP: 118/68  Pulse: 65  SpO2: 98%  Weight: 197 lb (89.4 kg)  Height: 5\' 8"  (1.727 m)    Estimated body mass index is 29.95 kg/m as calculated from the following:   Height as of this encounter: 5\' 8"  (1.727 m).   Weight as of this encounter: 197 lb (89.4 kg).  @[email or web address] @  [employer]   01/09/24 1402  Weight: 197 lb (89.4 kg)     Physical Exam   General: No distress. Looks stable but on schooter O2 at rest: no Cane present: no Sitting in wheel chair: no Frail: no Obese: no Neuro: Alert and Oriented x 3. GCS 15. Speech normal Psych: Pleasant Resp:  Barrel Chest - no.  Wheeze - no, Crackles - no, No overt respiratory distress CVS: Normal heart sounds. Murmurs - no Ext: Stigmata of Connective Tissue Disease - no HEENT: Normal upper airway. PEERL +. No post nasal drip        Assessment:       ICD-10-CM   1. DOE (dyspnea on exertion)  R06.09 CBC w/Diff    CT Chest High Resolution    ECHOCARDIOGRAM COMPLETE    2. Mitral valve stenosis, unspecified etiology  I05.0 CBC w/Diff    CT Chest High Resolution    ECHOCARDIOGRAM COMPLETE    3. Family history of pulmonary fibrosis  Z83.6 CBC w/Diff    CT Chest High Resolution    ECHOCARDIOGRAM COMPLETE    4. 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 History of anemia  Z86.2 CBC w/Diff    CT Chest High Resolution    ECHOCARDIOGRAM COMPLETE    6. History of obstructive sleep apnea  Z86.69          Plan:     Patient Instructions     ICD-10-CM   1. DOE (dyspnea on exertion)  R06.09 CBC w/Diff    CT Chest High Resolution    ECHOCARDIOGRAM COMPLETE    2. Mitral valve stenosis, unspecified etiology  I05.0 CBC w/Diff    CT Chest High Resolution    ECHOCARDIOGRAM COMPLETE    3. Family history of pulmonary fibrosis  Z83.6 CBC w/Diff    CT  Chest High Resolution    ECHOCARDIOGRAM COMPLETE    4. 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 History of anemia  Z86.2 CBC w/Diff    CT Chest High Resolution    ECHOCARDIOGRAM COMPLETE    6. History of obstructive sleep apnea  Z86.69       There is ongoing shortness of breath which I believe is in part because of anemia, cardiac issues such as mitral stenosis and physical deconditioning and mobility issues.  However we need to ensure that interstitial lung abnormalities described in the past has not become interstitial lung disease.  Pulmonary function test is stable/improved   Plan  - Do blood work for CBC to ensure stability and anemia - Do echocardiogram to check the status of the mitral stenosis. - Do high-resolution CT chest supine and prone  Follow-up - Return to see nurse practitioner or Dr. Bertrum Brodie in the next 4-8 weeks either video visit or face-to-face for follow-up   FOLLOWUP Return for 4-8 weeks, video or face-to-face, [place] Herd or app, 15-minute.    SIGNATURE    Dr. Maire Scot,  M.D., F.C.C.P,  Pulmonary and Critical Care Medicine Staff Physician, [facility] Center Director - Interstitial Lung Disease  Program  Pulmonary Fibrosis [facility] [place], [place], [address]  Pager: [phone], If no answer or between  15:00h - 7:00h: call [phone] Telephone: [phone]  2:44 PM 01/09/2024

## 2024-01-09 NOTE — Patient Instructions (Addendum)
 ICD-10-CM   1. DOE (dyspnea on exertion)  R06.09 CBC w/Diff    CT Chest High Resolution    ECHOCARDIOGRAM COMPLETE    2. Mitral valve stenosis, unspecified etiology  I05.0 CBC w/Diff    CT Chest High Resolution    ECHOCARDIOGRAM COMPLETE    3. Family history of pulmonary fibrosis  Z83.6 CBC w/Diff    CT Chest High Resolution    ECHOCARDIOGRAM COMPLETE    4. 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 History of anemia  Z86.2 CBC w/Diff    CT Chest High Resolution    ECHOCARDIOGRAM COMPLETE    6. History of obstructive sleep apnea  Z86.69       There is ongoing shortness of breath which I believe is in part because of anemia, cardiac issues such as mitral stenosis and physical deconditioning and mobility issues.  However we need to ensure that interstitial lung abnormalities described in the past has not become interstitial lung disease.  Pulmonary function test is stable/improved   Plan  - Do blood work for CBC to ensure stability and anemia - Do echocardiogram to check the status of the mitral stenosis. - Do high-resolution CT chest supine and prone  Follow-up - Return to see nurse practitioner or Dr. Bertrum Brodie in the next 4-8 weeks either video visit or face-to-face for follow-up

## 2024-01-09 NOTE — Patient Instructions (Signed)
Spiro/DLCO performed today. 

## 2024-01-12 ENCOUNTER — Ambulatory Visit (HOSPITAL_BASED_OUTPATIENT_CLINIC_OR_DEPARTMENT_OTHER)
Admission: RE | Admit: 2024-01-12 | Discharge: 2024-01-12 | Disposition: A | Discharge status: Home / Self Care | Source: Ambulatory Visit | Attending: Internal Medicine | Admitting: Internal Medicine

## 2024-01-12 DIAGNOSIS — Z836 Family history of other diseases of the respiratory system: Secondary | ICD-10-CM | POA: Diagnosis present

## 2024-01-12 DIAGNOSIS — Z862 Personal history of diseases of the blood and blood-forming organs and certain disorders involving the immune mechanism: Secondary | ICD-10-CM | POA: Insufficient documentation

## 2024-01-12 DIAGNOSIS — I050 Rheumatic mitral stenosis: Secondary | ICD-10-CM | POA: Diagnosis present

## 2024-01-12 DIAGNOSIS — R0609 Other forms of dyspnea: Secondary | ICD-10-CM | POA: Insufficient documentation

## 2024-01-12 DIAGNOSIS — R918 Other nonspecific abnormal finding of lung field: Secondary | ICD-10-CM | POA: Diagnosis present

## 2024-02-09 ENCOUNTER — Ambulatory Visit (HOSPITAL_BASED_OUTPATIENT_CLINIC_OR_DEPARTMENT_OTHER)

## 2024-02-09 DIAGNOSIS — Z836 Family history of other diseases of the respiratory system: Secondary | ICD-10-CM

## 2024-02-09 DIAGNOSIS — Z862 Personal history of diseases of the blood and blood-forming organs and certain disorders involving the immune mechanism: Secondary | ICD-10-CM

## 2024-02-09 DIAGNOSIS — I050 Rheumatic mitral stenosis: Secondary | ICD-10-CM

## 2024-02-09 DIAGNOSIS — R0609 Other forms of dyspnea: Secondary | ICD-10-CM

## 2024-02-09 DIAGNOSIS — R918 Other nonspecific abnormal finding of lung field: Secondary | ICD-10-CM

## 2024-02-09 LAB — ECHOCARDIOGRAM COMPLETE
AR max vel: 2.03 cm2
AV Area VTI: 2.14 cm2
AV Area mean vel: 2.09 cm2
AV Mean grad: 8 mmHg
AV Peak grad: 14.9 mmHg
Ao pk vel: 1.93 m/s
Area-P 1/2: 2.61 cm2
S' Lateral: 1.35 cm

## 2024-02-09 MED ORDER — PERFLUTREN LIPID MICROSPHERE
1.0000 mL | INTRAVENOUS | Status: AC | PRN
  Administered 2024-02-09: 0.5 mL via INTRAVENOUS

## 2024-02-22 NOTE — Progress Notes (Signed)
 [facility] - Clinic Note  03/04/2024     CHIEF COMPLAINT Patient presents for Retina Follow Up  HISTORY OF PRESENT ILLNESS: Cody Phelps is a 83 y.o. male who presents to the clinic today for:  HPI     Retina Follow Up   In right eye.  This started 3 months ago.  Duration of 3 months.  Since onset it is stable.  I, the attending physician,  performed the HPI with the patient and updated documentation appropriately.        Comments   3 month retina follow up CME OD pt is reporting vision little blurred just got new RX has not got yet he denies any flashes has some floaters Cosopt  bid OU prolensa  bid OD and pred  bid OD       Last edited by Valdemar Rogue, MD on 03/08/2024  2:24 AM.    Pt states vision is about the same, he has a rx for new glasses, but hasn't gotten it filled yet, pts wife states she is having a hard time getting the drops from the pharmacy  Referring physician: Nichole Senior, MD [address] [place],  [place] [address]  HISTORICAL INFORMATION:   Selected notes from the MEDICAL RECORD NUMBER Referred by Dr. Jordan DeMarco for concern of RD OD   CURRENT MEDICATIONS: Current Outpatient Medications (Ophthalmic Drugs)  Medication Sig   Bromfenac  Sodium 0.07 % SOLN Place 1 drop into the right eye 2 (two) times daily.   Bromfenac  Sodium 0.09 % SOLN Place 1 drop into the right eye 2 (two) times daily.   dorzolamide -timolol  (COSOPT ) 2-0.5 % ophthalmic solution Place 1 drop into the right eye daily.   dorzolamide -timolol  (COSOPT ) 2-0.5 % ophthalmic solution Place 1 drop into the right eye daily.   Loteprednol  Etabonate (LOTEMAX  SM) 0.38 % GEL Place 1 drop into the right eye 2 (two) times daily.   Loteprednol  Etabonate (LOTEMAX  SM) 0.38 % GEL Place 1 drop into the right eye 2 (two) times daily.   PROLENSA  0.07 % SOLN Place 1 drop into the right eye 2 (two) times daily.   PROLENSA  0.07 % SOLN Place 1 drop into the right eye 2 (two) times daily.    No current facility-administered medications for this visit. (Ophthalmic Drugs)   Current Outpatient Medications (Other)  Medication Sig   albuterol  (VENTOLIN  HFA) 108 (90 Base) MCG/ACT inhaler Inhale 1-2 puffs into the lungs every 6 (six) hours as needed for wheezing or shortness of breath.   apixaban  (ELIQUIS ) 5 MG TABS tablet Take 1 tablet (5 mg total) by mouth 2 (two) times daily.   atorvastatin  (LIPITOR ) 80 MG tablet TAKE 1 TABLET BY MOUTH  DAILY (Patient taking differently: Take 40 mg by mouth at bedtime.)   colesevelam  (WELCHOL ) 625 MG tablet Take 625 mg by mouth 3 (three) times daily.    donepezil  (ARICEPT ) 10 MG tablet Take 10 mg by mouth every morning.    esomeprazole (NEXIUM) 40 MG capsule Take 40 mg by mouth daily.    ferrous sulfate  325 (65 FE) MG tablet TAKE 1 TABLET BY MOUTH EVERY DAY WITH BREAKFAST   gabapentin  (NEURONTIN ) 300 MG capsule Take 300 mg by mouth at bedtime.   GEMTESA 75 MG TABS Take 1 tablet by mouth daily.   insulin  glargine (LANTUS ) 100 UNIT/ML injection Inject 20-40 Units into the skin at bedtime as needed (BGL below 140-150 take 20 units, if 151 or higher take 40 units).   memantine  (  NAMENDA ) 10 MG tablet Take 1 tablet (10 mg total) by mouth 2 (two) times daily. Please call and make overdue appt for further refills. 2nd attempt (Patient taking differently: Take 5 mg by mouth 2 (two) times daily. Please call and make overdue appt for further refills. 2nd attempt)   metoprolol  tartrate (LOPRESSOR ) 25 MG tablet TAKE 1 TABLET BY MOUTH  DAILY (Patient taking differently: Take 12.5 mg by mouth 2 (two) times daily.)   nitroGLYCERIN  (NITROSTAT ) 0.4 MG SL tablet Place 1 tablet (0.4 mg total) under the tongue every 5 (five) minutes as needed for up to 25 days for chest pain.   omega-3 acid ethyl esters (LOVAZA ) 1 G capsule Take 2 g by mouth daily.   rOPINIRole  (REQUIP ) 2 MG tablet Take 3-4 mg by mouth daily as needed (RLS).   spironolactone  (ALDACTONE ) 25 MG tablet TAKE  ONE-HALF TABLET BY  MOUTH DAILY   tamsulosin  (FLOMAX ) 0.4 MG CAPS capsule Take 0.4 mg by mouth daily after supper.    torsemide  (DEMADEX ) 20 MG tablet Take 1 tablet (20 mg total) by mouth as directed. 2 times a week on Tuesday and Thursday.  Take additional dose as directed with weight gain of 2 Lbs   valsartan -hydrochlorothiazide  (DIOVAN -HCT) 80-12.5 MG tablet TAKE 1 TABLET BY MOUTH IN  THE MORNING   vitamin B-12 (CYANOCOBALAMIN ) 1000 MCG tablet Take 1,000 mcg by mouth 2 (two) times daily.   No current facility-administered medications for this visit. (Other)   REVIEW OF SYSTEMS: ROS   Positive for: Gastrointestinal, Musculoskeletal, Endocrine, Cardiovascular, Eyes, Respiratory Negative for: Constitutional, Neurological, Skin, Genitourinary, HENT, Psychiatric, Allergic/Imm, Heme/Lymph Last edited by Resa Delon ORN, COT on 03/04/2024 12:57 PM.     ALLERGIES No Known Allergies  PAST MEDICAL HISTORY Past Medical History:  Diagnosis Date   Acute anterior wall MI ([name]) 03/01/2018   Acute combined systolic and diastolic heart failure ([name]) 03/15/2018   Burn    Chronic diastolic CHF (congestive heart failure) ([name]) 09/21/2018   Defect, retina, with detachment    Dementia ([name])    Diverticulosis    ED (erectile dysfunction)    First degree heart block    GERD (gastroesophageal reflux disease)    Heart murmur    History of blood transfusion    with open heart surgery   Hypertension    cardiologsit-  dr ladona   Hypertensive retinopathy    OU   Iron deficiency anemia    Mixed hyperlipidemia    Morbid obesity ([name])    Myocardial infarction ([name])    02/28/18   OA (osteoarthritis)    right hip   OSA on CPAP    followed by dr dohmeier, uses CPAP   PAF (paroxysmal atrial fibrillation) ([name])    a. on Eliquis    Prostate cancer ([name])    dx 03-17-2017 (bx)  Stage T2a, Gleason 4+4, PSA  4.43, vol 32.32--  s/p radiatctive prostate seed implants 07-10-2017 then IMRT and ADT   Retinal  detachment    Rheg. 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   LIMA to Diagonal Branch   S/P Maze operation for atrial fibrillation 05/30/2018   Complete bilateral atrial lesion set using bipolar radiofrequency and cryothermy ablation with clipping of LA appendage   Scoliosis    Trigger finger    Type 2 diabetes  mellitus ([name])    Wears partial dentures    upper and lower   Past Surgical History:  Procedure Laterality Date   AORTIC VALVE REPLACEMENT N/A 05/30/2018   Procedure: AORTIC VALVE REPLACEMENT (AVR) using Inspiris Valve, Size 23;  Surgeon: Dusty Sudie DEL, MD;  Location: [facility] OR;  Service: Open Heart Surgery;  Laterality: N/A;   APPENDECTOMY  1988   BILATERAL TOTAL ETHMOIDECTOMY AND SPHENOIDECTOMY  01-14-2009    dr carlie   [facility]   CARDIOVERSION N/A 08/10/2018   Procedure: CARDIOVERSION;  Surgeon: Ladona Heinz, MD;  Location: [facility] ENDOSCOPY;  Service: Cardiovascular;  Laterality: N/A;   CARPAL TUNNEL RELEASE Bilateral 2013   CATARACT EXTRACTION Bilateral    Dr. Cleatus   CATARACT EXTRACTION W/ INTRAOCULAR LENS  IMPLANT, BILATERAL  date?   CORONARY ARTERY BYPASS GRAFT N/A 05/30/2018   CORONARY BALLOON ANGIOPLASTY N/A 03/01/2018   Procedure: CORONARY BALLOON ANGIOPLASTY;  Surgeon: Ladona Heinz, MD;  Location: [facility] INVASIVE CV LAB;  Service: Cardiovascular;  Laterality: N/A;   CORONARY/GRAFT ACUTE MI REVASCULARIZATION N/A 03/01/2018   Procedure: Coronary/Graft Acute MI Revascularization;  Surgeon: Ladona Heinz, MD;  Location: [facility] INVASIVE CV LAB;  Service: Cardiovascular;  Laterality: N/A;   CYSTOSCOPY  07/19/2017   Procedure: CYSTOSCOPY;  Surgeon: Chauncey Redell Agent, MD;  Location: [facility];  Service: Urology;;  No seeds found in blader   EYE SURGERY Bilateral    Cat Sx OU.  Dislocated IOL sx OD.  Rheg. RD repair OS.    EYE SURGERY     GAS/FLUID EXCHANGE Right 06/13/2019   Procedure: GAS/FLUID EXCHANGE;  Surgeon: Valdemar Redell, MD;  Location: [facility] OR;  Service: Ophthalmology;  Laterality: Right;   LAMINECTOMY WITH POSTERIOR LATERAL ARTHRODESIS LEVEL 2 N/A 02/12/2014   Procedure: LUMBAR TWO-THREE,LUIMBAR THREE-FOUR LAMINECTOMY/FORAMINOTOMY;POSSIBLE POSTEROLATERAL ARTHRODESIS WITH AUTOGRAFT;  Surgeon: Catalina CHRISTELLA Stains, MD;  Location: [facility] NEURO ORS;  Service: Neurosurgery;  Laterality: N/A;   LEFT HEART CATH AND CORONARY ANGIOGRAPHY N/A 03/01/2018   Procedure: LEFT HEART CATH AND CORONARY ANGIOGRAPHY;  Surgeon: Ladona Heinz, MD;  Location: [facility] INVASIVE CV LAB;  Service: Cardiovascular;  Laterality: N/A;   MAZE N/A 05/30/2018   Procedure: MAZE;  Surgeon: Dusty Sudie DEL, MD;  Location: [facility] OR;  Service: Open Heart Surgery;  Laterality: N/A;   ORIF RIGHT ANKLE FX'S  05/05/2001   retained hardware   PARS PLANA VITRECTOMY Right 06/13/2019   Procedure: PARS PLANA VITRECTOMY WITH 25 GAUGE WITH LASER AND GAS;  Surgeon: Valdemar Redell, MD;  Location: [facility] OR;  Service: Ophthalmology;  Laterality: Right;   PARS PLANA VITRECTOMY Right 09/12/2019   Procedure: Pars Plana Vitrectomy With 25 Gauge;  Surgeon: Valdemar Redell, MD;  Location: [facility] OR;  Service: Ophthalmology;  Laterality: Right;   PERFLUORONE INJECTION Right 06/13/2019   Procedure: PERFLUORONE INJECTION;  Surgeon: Valdemar Redell, MD;  Location: [facility] OR;  Service: Ophthalmology;  Laterality: Right;   PHOTOCOAGULATION WITH LASER Right 06/13/2019   Procedure: PHOTOCOAGULATION WITH LASER;  Surgeon: Valdemar Redell, MD;  Location: [facility] OR;  Service: Ophthalmology;  Laterality: Right;   PLACEMENT AND SUTURE OF SECONDARY INTRAOCULAR LENS Right 09/12/2019   Procedure: PLACEMENT AND SUTURE OF SECONDARY INTRAOCULAR LENS;  Surgeon: Valdemar Redell, MD;  Location: [facility] OR;  Service: Ophthalmology;  Laterality: Right;   PROSTATE BIOPSY  03-17-2017   dr chauncey office   RADIOACTIVE SEED IMPLANT N/A  07/19/2017   Procedure: RADIOACTIVE SEED IMPLANT/BRACHYTHERAPY IMPLANT;  Surgeon: Chauncey Redell Agent, MD;  Location: [facility];  Service: Urology;  Laterality: N/A;  69 seeds implanted   REMOVAL RETAINED LENS Right 09/12/2019   Procedure: REMOVAL RETAINED LENS;  Surgeon: Valdemar Rogue, MD;  Location: [facility] OR;  Service: Ophthalmology;  Laterality: Right;   RETINAL DETACHMENT SURGERY Right 06/13/2019   Dr. Valdemar   RIGHT/LEFT HEART CATH AND CORONARY/GRAFT ANGIOGRAPHY N/A 10/26/2021   Procedure: RIGHT/LEFT HEART CATH AND CORONARY/GRAFT ANGIOGRAPHY;  Surgeon: Ladona Heinz, MD;  Location: [facility] INVASIVE CV LAB;  Service: Cardiovascular;  Laterality: N/A;   SKIN GRAFT     face   SPACE OAR INSTILLATION N/A 07/19/2017   Procedure: SPACE OAR INSTILLATION;  Surgeon: Chauncey Rogue Agent, MD;  Location: [facility];  Service: Urology;  Laterality: N/A;   TEE WITHOUT CARDIOVERSION  03/20/2012   Procedure: TRANSESOPHAGEAL ECHOCARDIOGRAM (TEE);  Surgeon: Erick JONELLE Ladona, MD;  Location: [facility] ENDOSCOPY;  Service: Cardiovascular;  Laterality: N/A;  normal LV; normal EF; normal RV; normal LA w/ left atrial appendage very small, normal function, interatrial septum intact without defect; normal RA; trace MR,TR, & PI; mild AV calcification and senile degeneration w/ mild stenosis, AVA 1.7cm^2;;    TEE WITHOUT CARDIOVERSION N/A 05/30/2018   Procedure: TRANSESOPHAGEAL ECHOCARDIOGRAM (TEE);  Surgeon: Dusty Sudie DEL, MD;  Location: [facility] OR;  Service: Open Heart Surgery;  Laterality: N/A;   TOTAL HIP ARTHROPLASTY Right 10/23/2017   Procedure: TOTAL HIP ARTHROPLASTY ANTERIOR APPROACH;  Surgeon: Liam Lerner, MD;  Location: [facility] OR;  Service: Orthopedics;  Laterality: Right;   TRANSTHORACIC ECHOCARDIOGRAM  01-11-2017   dr ladona  (per echo note, no significant change in seveity of AS, no other diagnostic change)   moderate concentric LVH, ef 64%, grade 1 diastolic dysfunction/  moderate LAE/  mild , grade 1  AR w/ moderate AV calcification, mild to moderate restricted AV leaflets w/ moderate AS, AVA 1.16cm^2, peak grandient , mean grandient 62mmHg/  trace MR, mild calcification MV annulus , mild MV leaflet calcification, mild MVS, peak grandient 4.7mmHg, mean grandient 2.28mmHg/ trace TR   FAMILY HISTORY Family History  Problem Relation Age of Onset   Stroke Mother 47   Hypertension Mother    Hypertension Father    Heart disease Father    Heart attack Father 46   Heart murmur Father    Hypertension Sister    Lung disease Brother    Colon cancer Neg Hx    Stomach cancer Neg Hx    Rectal cancer Neg Hx    Pancreatic cancer Neg Hx    SOCIAL HISTORY Social History   Tobacco Use   Smoking status: Former    Current packs/day: 0.00    Average packs/day: 0.3 packs/day for 1 year (0.3 ttl pk-yrs)    Types: Cigarettes    Start date: 02/03/1966    Quit date: 02/04/1967    Years since quitting: 57.1   Smokeless tobacco: Never   Tobacco comments:    smoked 1 q2-3 days  Vaping Use   Vaping status: Never Used  Substance Use Topics   Alcohol use: Not Currently    Comment: occasional beer   Drug use: Never       OPHTHALMIC EXAM:  Base Eye Exam     Visual Acuity (Snellen - Linear)       Right Left   Dist cc 20/40 -2 20/25   Dist ph cc NI 20/25 +1    Correction: Glasses         Tonometry (Tonopen, 1:03 PM)       Right Left   Pressure  16 11         Pupils       Pupils Dark Light Shape React APD   Right PERRL 3 2 Round Brisk None   Left PERRL 3 2 Round Brisk None         Visual Fields       Left Right    Full Full         Extraocular Movement       Right Left    Full, Ortho Full, Ortho         Neuro/Psych     Oriented x3: Yes   Mood/Affect: Normal         Dilation     Both eyes: 2.5% Phenylephrine  @ 1:03 PM           Slit Lamp and Fundus Exam     Slit Lamp Exam       Right Left   Lids/Lashes Dermatochalasis - upper lid, Meibomian  gland dysfunction Dermatochalasis - upper lid   Conjunctiva/Sclera White and quiet, STK ST quad Temporal Pinguecula   Cornea well healed superior cataract wound, 2+inferior Punctate epithelial erosions, Arcus Trace Punctate epithelial erosions, trace fine endo pigment, tear film debris, irregular epi   Anterior Chamber deep and clear Deep and quiet   Iris Round and moderately dilated, scattered Transillumination defects 360, +iridodenesis, +pigment deposition, +atrophy Round and dilated   Lens Sutured Akreos IOL well centered 3 piece Posterior chamber intraocular in good position, trace Posterior capsular opacification   Anterior Vitreous post vitrectomy, trace fine pigment Vitreous syneresis, Posterior vitreous detachment         Fundus Exam       Right Left   Disc 1-2+Pallor, Sharp rim 2+Pallor, Sharp rim, mild PPA/PPP, mild tilt, Compact   C/D Ratio 0.3 0.6   Macula Flat, Blunted foveal reflex, mild RPE mottling and clumping, trace central cystic changes -- improved, +pigment deposition on retinal surface -- improved, focal MA nasal fovea, mild ERM Flat, good foveal reflex, Retinal pigment epithelial mottling and clumping, No heme or edema   Vessels attenuated, Tortuous attenuated, Tortuous   Periphery attached, good 360 laser in place, rare MA/DBH; ORIGINALLY: bullous superior retinal detachment from 1000-0130, another more shallow detachment lobe from 130-400, lattice and micro tears ST quadrant; Old retinal tear at 0900 with surrounding laser Attached, peripheral laser scars at 1030, No new RT/RD           Refraction     Wearing Rx       Sphere Cylinder Axis Add   Right -1.25 +1.50 025 +2.75   Left -1.00 +1.75 180 +2.75           IMAGING AND PROCEDURES  Imaging and Procedures for @TODAY @  OCT, Retina - OU - Both Eyes       Right Eye Quality was good. Central Foveal Thickness: 271. Progression has improved. Findings include normal foveal contour, no IRF, no SRF  (interval improvement in IRF/cystic changes nasal fovea, trace ERM).   Left Eye Quality was good. Central Foveal Thickness: 265. Progression has been stable. Findings include normal foveal contour, no IRF, no SRF.   Notes *Images captured and stored on drive  Diagnosis / Impression:  OD: Interval improvement in IRF/cystic changes nasal fovea (+CME), trace ERM OS: NFP, no IRF/SRF   Clinical management:  See below  Abbreviations: NFP - Normal foveal profile. CME - cystoid macular edema. PED - pigment epithelial detachment. IRF - intraretinal fluid. SRF - subretinal  fluid. EZ - ellipsoid zone. ERM - epiretinal membrane. ORA - outer retinal atrophy. ORT - outer retinal tubulation. SRHM - subretinal hyper-reflective material            ASSESSMENT/PLAN:    ICD-10-CM   1. Dislocation of intraocular lens, sequela  T85.22XS     2. Right retinal detachment  H33.21     3. 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 History of repair of retinal tear by laser photocoagulation  Z98.890     5. Diabetes mellitus type 2 without retinopathy ([name])  E11.9     6. Essential hypertension  I10     7. Hypertensive retinopathy of both eyes  H35.033     8. Pseudophakia of both eyes  Z96.1     9. Cystoid macular edema of right eye  H35.351 OCT, Retina - OU - Both Eyes     1. Dislocated IOL OD  - pt with history of partially dislocated 3-piece IOL -- spontaneously dislocated completely into vitreous cavity on 1.23.21  - s/p 25g PPV w/ IOL explantation and secondary sutured IOL OD -- Akreos AO60 lens, 17.5D -- 02.11.2021             - IOL in good position  - 2 superior nylon sutures removed at slit lamp 03.12.21 -- last nylon suture removed at slit lamp, 04.30.21  - corneal edema resolved; PEE improved             - s/p STK OD #1 (07.09.21), #2 (01.26.22), #3 (08.30.22) for CME  - BCVA 20/30 - stable  - OCT Interval increase in IRF/cystic changes nasal fovea, trace ERM (see #9  below)  - IOP 08  - Ats QID OU   - f/u 3 months -- DFE, OCT   2,3. Rhegmatogenous retinal detachment, right eye  - bullous, superior, mac off detachment, onset of foveal involvement 11.11.20 by history  - Bi-lobed superior detachment, superior lobe spanning 1000-0130, nasal lobe 0130-0400  - lattice degeneration with microtears noted at 1030  - s/p PPV/PFC/EL/FAX/14% C3F8 OD, 11.12.20             - intraop: HST at 2 oclock was found; also detachment had progressed to subtotal detachment spanning 9 oclock to 6 oclock (going in clockwise direction); also severe zonular insufficiency with IOL very mobile throughout case -- did not dislocate completely during the case or immediately post op  - doing well  - retina attached and in good position--good laser in place  4. History of retinal defects s/p laser retinopexy OU with Dr. Alvia in 2013  - OD laser at 0900  - OS laser at 1030  - stable  5. Diabetes mellitus, type 2 without retinopathy  - A1c was 6.2 in August 2022  - The incidence, risk factors for progression, natural history and treatment options for diabetic retinopathy  were discussed with patient.    - The need for close monitoring of blood glucose, blood pressure, and serum lipids, avoiding cigarette or any type of tobacco, and the need for long term follow up was also discussed with patient.   - monitor  6,7. Hypertensive retinopathy OU  - discussed importance of tight BP control  - monitor  8. Pseudophakia OU  - s/p CE/IOL OU (Dr. Cleatus)  - 3 piece IOL OD was completely displaced into vitreous -- now with sutured Akreos IOL in good position OD -- see above  - OS 3-piece IOL in good position  -  monitor  9. 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    Return in about 3 months (around 06/04/2024) for f/u CME OD, DFE, OCT.  There are no Patient Instructions on file for this visit.  This document serves as a record of services personally performed by Redell JUDITHANN Hans, MD, PhD. It was created on their behalf by Avelina Pereyra, COA an ophthalmic technician. The creation of this record is the provider's dictation and/or activities during the visit.   Electronically signed by: Avelina GORMAN Pereyra, COT  03/08/24  2:26 AM   Redell JUDITHANN Hans, M.D., Ph.D. Diseases & Surgery of the Retina and Vitreous [facility]  I have reviewed the above documentation for accuracy and completeness, and I agree with the above. Redell JUDITHANN Hans, M.D., Ph.D. 03/08/24 2:28 AM    Abbreviations: M myopia (nearsighted); A astigmatism; H hyperopia (farsighted); P presbyopia; Mrx spectacle prescription;  CTL contact lenses; OD right eye; OS left eye; OU both eyes  XT exotropia; ET esotropia; PEK punctate epithelial keratitis; PEE punctate epithelial erosions; DES dry eye syndrome; MGD meibomian gland dysfunction; ATs artificial tears; PFAT's preservative free artificial tears; NSC nuclear sclerotic cataract; PSC posterior subcapsular cataract; ERM epi-retinal membrane; PVD posterior vitreous detachment; RD retinal detachment; DM diabetes mellitus; DR diabetic retinopathy; NPDR non-proliferative diabetic retinopathy; PDR proliferative diabetic retinopathy; CSME clinically significant macular edema; DME diabetic macular edema; dbh dot blot hemorrhages; CWS cotton wool spot; POAG primary open angle glaucoma; C/D cup-to-disc ratio; HVF  humphrey visual field; GVF goldmann visual field; OCT optical coherence tomography; IOP intraocular pressure; BRVO Branch retinal vein occlusion; CRVO central retinal vein occlusion; CRAO central retinal artery occlusion; BRAO branch retinal artery occlusion; RT retinal tear; SB scleral buckle; PPV pars plana vitrectomy; VH Vitreous hemorrhage; PRP panretinal laser photocoagulation; IVK intravitreal kenalog ; VMT vitreomacular traction; MH Macular hole;  NVD neovascularization of the disc; NVE neovascularization elsewhere; AREDS age related eye disease study; ARMD age related macular degeneration; POAG primary open angle glaucoma; EBMD epithelial/anterior basement membrane dystrophy; ACIOL anterior chamber intraocular lens; IOL intraocular lens; PCIOL posterior chamber intraocular lens; Phaco/IOL phacoemulsification with intraocular lens placement; PRK photorefractive keratectomy; LASIK laser assisted in situ keratomileusis; HTN hypertension; DM diabetes mellitus; COPD chronic obstructive pulmonary disease

## 2024-03-04 ENCOUNTER — Encounter (INDEPENDENT_AMBULATORY_CARE_PROVIDER_SITE_OTHER): Payer: Self-pay | Admitting: Ophthalmology

## 2024-03-04 ENCOUNTER — Ambulatory Visit (INDEPENDENT_AMBULATORY_CARE_PROVIDER_SITE_OTHER): Admitting: Ophthalmology

## 2024-03-04 DIAGNOSIS — T8522XS Displacement of intraocular lens, sequela: Secondary | ICD-10-CM

## 2024-03-04 DIAGNOSIS — H3321 Serous retinal detachment, right eye: Secondary | ICD-10-CM

## 2024-03-04 DIAGNOSIS — I10 Essential (primary) hypertension: Secondary | ICD-10-CM

## 2024-03-04 DIAGNOSIS — H35351 Cystoid macular degeneration, right eye: Secondary | ICD-10-CM

## 2024-03-04 DIAGNOSIS — H35411 Lattice degeneration of retina, right eye: Secondary | ICD-10-CM | POA: Diagnosis not present

## 2024-03-04 DIAGNOSIS — E119 Type 2 diabetes mellitus without complications: Secondary | ICD-10-CM | POA: Diagnosis not present

## 2024-03-04 DIAGNOSIS — H35033 Hypertensive retinopathy, bilateral: Secondary | ICD-10-CM

## 2024-03-04 DIAGNOSIS — Z9889 Other specified postprocedural states: Secondary | ICD-10-CM

## 2024-03-04 DIAGNOSIS — Z961 Presence of intraocular lens: Secondary | ICD-10-CM

## 2024-03-07 ENCOUNTER — Encounter (HOSPITAL_BASED_OUTPATIENT_CLINIC_OR_DEPARTMENT_OTHER): Payer: Self-pay | Admitting: Nurse Practitioner

## 2024-03-07 ENCOUNTER — Ambulatory Visit (HOSPITAL_BASED_OUTPATIENT_CLINIC_OR_DEPARTMENT_OTHER): Admitting: Nurse Practitioner

## 2024-03-07 ENCOUNTER — Other Ambulatory Visit (HOSPITAL_BASED_OUTPATIENT_CLINIC_OR_DEPARTMENT_OTHER): Payer: Self-pay | Admitting: Nurse Practitioner

## 2024-03-07 VITALS — BP 152/71 | HR 79 | Ht 68.0 in | Wt 196.4 lb

## 2024-03-07 DIAGNOSIS — G8929 Other chronic pain: Secondary | ICD-10-CM

## 2024-03-07 DIAGNOSIS — D649 Anemia, unspecified: Secondary | ICD-10-CM

## 2024-03-07 DIAGNOSIS — J328 Other chronic sinusitis: Secondary | ICD-10-CM

## 2024-03-07 DIAGNOSIS — R918 Other nonspecific abnormal finding of lung field: Secondary | ICD-10-CM | POA: Diagnosis not present

## 2024-03-07 DIAGNOSIS — M549 Dorsalgia, unspecified: Secondary | ICD-10-CM

## 2024-03-07 DIAGNOSIS — R0609 Other forms of dyspnea: Secondary | ICD-10-CM

## 2024-03-07 DIAGNOSIS — I349 Nonrheumatic mitral valve disorder, unspecified: Secondary | ICD-10-CM

## 2024-03-07 MED ORDER — LEVALBUTEROL TARTRATE 45 MCG/ACT IN AERO: 1.0000 | INHALATION_SPRAY | RESPIRATORY_TRACT | 2 refills | Status: DC | PRN

## 2024-03-07 NOTE — Telephone Encounter (Signed)
 Insurance denying levalbuterol  since he hasn't tried albuterol . Will send in rx for albuterol  but if he develops any sensation of heart racing or increased HR, let us  know and we can submit a prior auth at that point. Thanks!
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
## 2024-03-07 NOTE — Patient Instructions (Addendum)
 CT again shows very mild changes. You did have some air trapping in your lungs, which can contribute to shortness of breath. You may have a little bit of reactive airways. We will trial an inhaler to see if this helps Start levalbuterol  1-2 puffs every 4-6 hours as needed for shortness of breath, wheezing  Repeat CT scan in one year for monitoring  Monitor the swelling in your legs and if it doesn't get better, let your heart doctor know  Follow up in 8 weeks with Dr. Geronimo or Izetta Malachy PIETY to see how inhaler is working. If symptoms do not improve or worsen, please contact office for sooner follow up or seek emergency care.

## 2024-03-08 ENCOUNTER — Encounter (INDEPENDENT_AMBULATORY_CARE_PROVIDER_SITE_OTHER): Payer: Self-pay | Admitting: Ophthalmology

## 2024-03-08 NOTE — Telephone Encounter (Signed)
 Pt notified on AM

## 2024-05-09 ENCOUNTER — Ambulatory Visit: Admitting: Nurse Practitioner

## 2024-05-21 NOTE — Progress Notes (Signed)
 [facility] - Clinic Note  06/04/2024     CHIEF COMPLAINT Patient presents for Retina Follow Up  HISTORY OF PRESENT ILLNESS: Cody Phelps is a 83 y.o. male who presents to the clinic today for:  HPI     Retina Follow Up   Patient presents with  Other.  In right eye.  This started 3 months ago.  I, the attending physician,  performed the HPI with the patient and updated documentation appropriately.        Comments   Patient here for 3 months retina follow up for CME OD. Patient states vision doing pretty good. No changes. No eye pain.       Last edited by Valdemar Rogue, MD on 06/09/2024  8:27 PM.    Pt states compliance w/ gtts. Grey, Pink and blue top BID OD.   Referring physician: Nichole Senior, MD [address] [place],  [place] [address]  HISTORICAL INFORMATION:   Selected notes from the MEDICAL RECORD NUMBER Referred by Dr. Jordan DeMarco for concern of RD OD   CURRENT MEDICATIONS: Current Outpatient Medications (Ophthalmic Drugs)  Medication Sig   Bromfenac  Sodium 0.09 % SOLN Place 1 drop into the right eye 2 (two) times daily.   dorzolamide -timolol  (COSOPT ) 2-0.5 % ophthalmic solution Place 1 drop into the right eye daily.   dorzolamide -timolol  (COSOPT ) 2-0.5 % ophthalmic solution Place 1 drop into the right eye daily.   Loteprednol  Etabonate (LOTEMAX  SM) 0.38 % GEL Place 1 drop into the right eye 2 (two) times daily.   Loteprednol  Etabonate (LOTEMAX  SM) 0.38 % GEL Place 1 drop into the right eye 2 (two) times daily.   PROLENSA  0.07 % SOLN Place 1 drop into the right eye 2 (two) times daily.   PROLENSA  0.07 % SOLN Place 1 drop into the right eye 2 (two) times daily.   Bromfenac  Sodium 0.07 % SOLN Place 1 drop into the right eye 2 (two) times daily.   No current facility-administered medications for this visit. (Ophthalmic Drugs)   Current Outpatient Medications (Other)  Medication Sig   albuterol  (VENTOLIN  HFA) 108 (90 Base) MCG/ACT inhaler  Inhale 1-2 puffs into the lungs every 6 (six) hours as needed for wheezing or shortness of breath.   apixaban  (ELIQUIS ) 5 MG TABS tablet Take 1 tablet (5 mg total) by mouth 2 (two) times daily.   atorvastatin  (LIPITOR ) 80 MG tablet TAKE 1 TABLET BY MOUTH  DAILY (Patient taking differently: Take 40 mg by mouth at bedtime.)   colesevelam  (WELCHOL ) 625 MG tablet Take 625 mg by mouth 3 (three) times daily.    donepezil  (ARICEPT ) 10 MG tablet Take 10 mg by mouth every morning.    esomeprazole (NEXIUM) 40 MG capsule Take 40 mg by mouth daily.    ferrous sulfate  325 (65 FE) MG tablet TAKE 1 TABLET BY MOUTH EVERY DAY WITH BREAKFAST   gabapentin  (NEURONTIN ) 300 MG capsule Take 300 mg by mouth at bedtime.   GEMTESA 75 MG TABS Take 1 tablet by mouth daily.   insulin  glargine (LANTUS ) 100 UNIT/ML injection Inject 20-40 Units into the skin at bedtime as needed (BGL below 140-150 take 20 units, if 151 or higher take 40 units).   memantine  (NAMENDA ) 10 MG tablet Take 1 tablet (10 mg total) by mouth 2 (two) times daily. Please call and make overdue appt for further refills. 2nd attempt (Patient taking differently: Take 5 mg by mouth 2 (two) times daily. Please call and make overdue  appt for further refills. 2nd attempt)   metoprolol  tartrate (LOPRESSOR ) 25 MG tablet TAKE 1 TABLET BY MOUTH  DAILY (Patient taking differently: Take 12.5 mg by mouth 2 (two) times daily.)   omega-3 acid ethyl esters (LOVAZA ) 1 G capsule Take 2 g by mouth daily.   rOPINIRole  (REQUIP ) 2 MG tablet Take 3-4 mg by mouth daily as needed (RLS).   spironolactone  (ALDACTONE ) 25 MG tablet TAKE ONE-HALF TABLET BY  MOUTH DAILY   tamsulosin  (FLOMAX ) 0.4 MG CAPS capsule Take 0.4 mg by mouth daily after supper.    valsartan -hydrochlorothiazide  (DIOVAN -HCT) 80-12.5 MG tablet TAKE 1 TABLET BY MOUTH IN  THE MORNING   vitamin B-12 (CYANOCOBALAMIN ) 1000 MCG tablet Take 1,000 mcg by mouth 2 (two) times daily.   nitroGLYCERIN  (NITROSTAT ) 0.4 MG SL tablet  Place 1 tablet (0.4 mg total) under the tongue every 5 (five) minutes as needed for up to 25 days for chest pain.   torsemide  (DEMADEX ) 20 MG tablet Take 1 tablet (20 mg total) by mouth as directed. 2 times a week on Tuesday and Thursday.  Take additional dose as directed with weight gain of 2 Lbs   No current facility-administered medications for this visit. (Other)   REVIEW OF SYSTEMS: ROS   Positive for: Gastrointestinal, Musculoskeletal, Endocrine, Cardiovascular, Eyes, Respiratory Negative for: Constitutional, Neurological, Skin, Genitourinary, HENT, Psychiatric, Allergic/Imm, Heme/Lymph Last edited by Orval Asberry RAMAN, COA on 06/04/2024 12:52 PM.      ALLERGIES No Known Allergies  PAST MEDICAL HISTORY Past Medical History:  Diagnosis Date   Acute anterior wall MI ([name]) 03/01/2018   Acute combined systolic and diastolic heart failure ([name]) 03/15/2018   Burn    Chronic diastolic CHF (congestive heart failure) ([name]) 09/21/2018   Defect, retina, with detachment    Dementia ([name])    Diverticulosis    ED (erectile dysfunction)    First degree heart block    GERD (gastroesophageal reflux disease)    Heart murmur    History of blood transfusion    with open heart surgery   Hypertension    cardiologsit-  dr ladona   Hypertensive retinopathy    OU   Iron deficiency anemia    Mixed hyperlipidemia    Morbid obesity ([name])    Myocardial infarction ([name])    02/28/18   OA (osteoarthritis)    right hip   OSA on CPAP    followed by dr dohmeier, uses CPAP   PAF (paroxysmal atrial fibrillation) ([name])    a. on Eliquis    Prostate cancer ([name])    dx 03-17-2017 (bx)  Stage T2a, Gleason 4+4, PSA  4.43, vol 32.32--  s/p radiatctive prostate seed implants 07-10-2017 then IMRT and ADT   Retinal detachment    Rheg. 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   LIMA to Diagonal Branch   S/P Maze operation for atrial fibrillation 05/30/2018   Complete bilateral atrial lesion set using bipolar radiofrequency and cryothermy ablation with clipping of LA appendage   Scoliosis    Trigger finger    Type 2 diabetes mellitus ([name])    Wears partial dentures    upper and lower   Past Surgical History:  Procedure Laterality Date   AORTIC VALVE REPLACEMENT N/A 05/30/2018   Procedure: AORTIC VALVE REPLACEMENT (AVR) using Inspiris Valve, Size 23;  Surgeon: Dusty Sudie DEL, MD;  Location: [facility] OR;  Service: Open Heart Surgery;  Laterality: N/A;   APPENDECTOMY  1988   BILATERAL TOTAL ETHMOIDECTOMY AND SPHENOIDECTOMY  01-14-2009    dr carlie   [facility]   CARDIOVERSION N/A 08/10/2018   Procedure: CARDIOVERSION;  Surgeon: Ladona Heinz, MD;  Location: [facility] ENDOSCOPY;  Service: Cardiovascular;  Laterality: N/A;   CARPAL TUNNEL RELEASE Bilateral 2013   CATARACT EXTRACTION Bilateral    Dr. Cleatus   CATARACT EXTRACTION W/ INTRAOCULAR LENS  IMPLANT, BILATERAL  date?   CORONARY ARTERY BYPASS GRAFT N/A 05/30/2018   CORONARY BALLOON ANGIOPLASTY N/A 03/01/2018   Procedure: CORONARY BALLOON ANGIOPLASTY;  Surgeon: Ladona Heinz, MD;  Location: [facility] INVASIVE CV LAB;  Service: Cardiovascular;  Laterality: N/A;   CORONARY/GRAFT ACUTE MI REVASCULARIZATION N/A 03/01/2018   Procedure: Coronary/Graft Acute MI Revascularization;  Surgeon: Ladona Heinz, MD;  Location: [facility] INVASIVE CV LAB;  Service: Cardiovascular;  Laterality: N/A;   CYSTOSCOPY  07/19/2017   Procedure: CYSTOSCOPY;  Surgeon: Chauncey Redell Agent, MD;  Location: [facility];  Service: Urology;;  No seeds found in blader   EYE SURGERY Bilateral    Cat Sx OU.  Dislocated IOL sx OD.  Rheg. RD repair OS.   EYE SURGERY     GAS/FLUID EXCHANGE Right 06/13/2019   Procedure: GAS/FLUID EXCHANGE;  Surgeon: Valdemar Redell, MD;  Location: [facility] OR;  Service: Ophthalmology;  Laterality: Right;   LAMINECTOMY WITH  POSTERIOR LATERAL ARTHRODESIS LEVEL 2 N/A 02/12/2014   Procedure: LUMBAR TWO-THREE,LUIMBAR THREE-FOUR LAMINECTOMY/FORAMINOTOMY;POSSIBLE POSTEROLATERAL ARTHRODESIS WITH AUTOGRAFT;  Surgeon: Catalina CHRISTELLA Stains, MD;  Location: [facility] NEURO ORS;  Service: Neurosurgery;  Laterality: N/A;   LEFT HEART CATH AND CORONARY ANGIOGRAPHY N/A 03/01/2018   Procedure: LEFT HEART CATH AND CORONARY ANGIOGRAPHY;  Surgeon: Ladona Heinz, MD;  Location: [facility] INVASIVE CV LAB;  Service: Cardiovascular;  Laterality: N/A;   MAZE N/A 05/30/2018   Procedure: MAZE;  Surgeon: Dusty Sudie DEL, MD;  Location: [facility] OR;  Service: Open Heart Surgery;  Laterality: N/A;   ORIF RIGHT ANKLE FX'S  05/05/2001   retained hardware   PARS PLANA VITRECTOMY Right 06/13/2019   Procedure: PARS PLANA VITRECTOMY WITH 25 GAUGE WITH LASER AND GAS;  Surgeon: Valdemar Redell, MD;  Location: [facility] OR;  Service: Ophthalmology;  Laterality: Right;   PARS PLANA VITRECTOMY Right 09/12/2019   Procedure: Pars Plana Vitrectomy With 25 Gauge;  Surgeon: Valdemar Redell, MD;  Location: [facility] OR;  Service: Ophthalmology;  Laterality: Right;   PERFLUORONE INJECTION Right 06/13/2019   Procedure: PERFLUORONE INJECTION;  Surgeon: Valdemar Redell, MD;  Location: [facility] OR;  Service: Ophthalmology;  Laterality: Right;   PHOTOCOAGULATION WITH LASER Right 06/13/2019   Procedure: PHOTOCOAGULATION WITH LASER;  Surgeon: Valdemar Redell, MD;  Location: [facility] OR;  Service: Ophthalmology;  Laterality: Right;   PLACEMENT AND SUTURE OF SECONDARY INTRAOCULAR LENS Right 09/12/2019   Procedure: PLACEMENT AND SUTURE OF SECONDARY INTRAOCULAR LENS;  Surgeon: Valdemar Redell, MD;  Location: [facility] OR;  Service: Ophthalmology;  Laterality: Right;   PROSTATE BIOPSY  03-17-2017   dr chauncey office   RADIOACTIVE SEED IMPLANT N/A 07/19/2017   Procedure: RADIOACTIVE SEED IMPLANT/BRACHYTHERAPY IMPLANT;  Surgeon: Chauncey Redell Agent, MD;  Location: [facility];  Service: Urology;  Laterality: N/A;  69 seeds implanted    REMOVAL RETAINED LENS Right 09/12/2019   Procedure: REMOVAL RETAINED LENS;  Surgeon: Valdemar Redell, MD;  Location: [facility] OR;  Service: Ophthalmology;  Laterality: Right;   RETINAL DETACHMENT SURGERY Right 06/13/2019  Dr. Valdemar   RIGHT/LEFT HEART CATH AND CORONARY/GRAFT ANGIOGRAPHY N/A 10/26/2021   Procedure: RIGHT/LEFT HEART CATH AND CORONARY/GRAFT ANGIOGRAPHY;  Surgeon: Ladona Heinz, MD;  Location: [facility] INVASIVE CV LAB;  Service: Cardiovascular;  Laterality: N/A;   SKIN GRAFT     face   SPACE OAR INSTILLATION N/A 07/19/2017   Procedure: SPACE OAR INSTILLATION;  Surgeon: Chauncey Redell Agent, MD;  Location: [facility];  Service: Urology;  Laterality: N/A;   TEE WITHOUT CARDIOVERSION  03/20/2012   Procedure: TRANSESOPHAGEAL ECHOCARDIOGRAM (TEE);  Surgeon: Erick JONELLE Ladona, MD;  Location: [facility] ENDOSCOPY;  Service: Cardiovascular;  Laterality: N/A;  normal LV; normal EF; normal RV; normal LA w/ left atrial appendage very small, normal function, interatrial septum intact without defect; normal RA; trace MR,TR, & PI; mild AV calcification and senile degeneration w/ mild stenosis, AVA 1.7cm^2;;    TEE WITHOUT CARDIOVERSION N/A 05/30/2018   Procedure: TRANSESOPHAGEAL ECHOCARDIOGRAM (TEE);  Surgeon: Dusty Sudie DEL, MD;  Location: [facility] OR;  Service: Open Heart Surgery;  Laterality: N/A;   TOTAL HIP ARTHROPLASTY Right 10/23/2017   Procedure: TOTAL HIP ARTHROPLASTY ANTERIOR APPROACH;  Surgeon: Liam Lerner, MD;  Location: [facility] OR;  Service: Orthopedics;  Laterality: Right;   TRANSTHORACIC ECHOCARDIOGRAM  01-11-2017   dr ladona  (per echo note, no significant change in seveity of AS, no other diagnostic change)   moderate concentric LVH, ef 64%, grade 1 diastolic dysfunction/  moderate LAE/  mild , grade 1 AR w/ moderate AV calcification, mild to moderate restricted AV leaflets w/ moderate AS, AVA 1.16cm^2, peak grandient , mean grandient 23mmHg/  trace MR, mild calcification MV annulus , mild MV  leaflet calcification, mild MVS, peak grandient 4.4mmHg, mean grandient 2.9mmHg/ trace TR   FAMILY HISTORY Family History  Problem Relation Age of Onset   Stroke Mother 74   Hypertension Mother    Hypertension Father    Heart disease Father    Heart attack Father 54   Heart murmur Father    Hypertension Sister    Lung disease Brother    Colon cancer Neg Hx    Stomach cancer Neg Hx    Rectal cancer Neg Hx    Pancreatic cancer Neg Hx    SOCIAL HISTORY Social History   Tobacco Use   Smoking status: Former    Current packs/day: 0.00    Average packs/day: 0.3 packs/day for 1 year (0.3 ttl pk-yrs)    Types: Cigarettes    Start date: 02/03/1966    Quit date: 02/04/1967    Years since quitting: 57.3   Smokeless tobacco: Never   Tobacco comments:    smoked 1 q2-3 days  Vaping Use   Vaping status: Never Used  Substance Use Topics   Alcohol use: Not Currently    Comment: occasional beer   Drug use: Never       OPHTHALMIC EXAM:  Base Eye Exam     Visual Acuity (Snellen - Linear)       Right Left   Dist cc 20/30 -1 20/20    Correction: Glasses         Tonometry (Tonopen, 12:50 PM)       Right Left   Pressure 22 16         Pupils       Dark Light Shape React APD   Right 3 2 Round Brisk None   Left 3 2 Round Brisk None         Visual Fields (Counting fingers)  Left Right    Full Full         Extraocular Movement       Right Left    Full, Ortho Full, Ortho         Neuro/Psych     Oriented x3: Yes   Mood/Affect: Normal         Dilation     Both eyes: 1.0% Mydriacyl , 2.5% Phenylephrine  @ 12:50 PM           Slit Lamp and Fundus Exam     Slit Lamp Exam       Right Left   Lids/Lashes Dermatochalasis - upper lid, Meibomian gland dysfunction Dermatochalasis - upper lid   Conjunctiva/Sclera White and quiet, STK ST quad Temporal Pinguecula   Cornea well healed superior cataract wound, 2+inferior Punctate epithelial erosions,  Arcus 1-2+ Punctate epithelial erosions, trace fine endo pigment, tear film debris, irregular epi   Anterior Chamber deep and clear Deep and quiet   Iris Round and moderately dilated, scattered Transillumination defects 360, +iridodenesis, +atrophy Round and dilated   Lens Sutured Akreos IOL well centered 3 piece Posterior chamber intraocular in good position, trace Posterior capsular opacification   Anterior Vitreous post vitrectomy, trace fine pigment Vitreous syneresis, Posterior vitreous detachment         Fundus Exam       Right Left   Disc 1-2+Pallor, Sharp rim 2+Pallor, Sharp rim, mild PPA/PPP, mild tilt, Compact   C/D Ratio 0.3 0.6   Macula Flat, Blunted foveal reflex, mild RPE mottling and clumping, trace central cystic changes, +pigment deposition on retinal surface -- improved, focal MA nasal fovea--improved, mild ERM Flat, good foveal reflex, Retinal pigment epithelial mottling and clumping, No heme or edema   Vessels attenuated, Tortuous attenuated, Tortuous   Periphery attached, good 360 laser in place, rare MA/DBH; ORIGINALLY: bullous superior retinal detachment from 1000-0130, another more shallow detachment lobe from 130-400, lattice and micro tears ST quadrant; Old retinal tear at 0900 with surrounding laser Attached, peripheral laser scars at 1030, No new RT/RD           Refraction     Wearing Rx       Sphere Cylinder Axis Add   Right -1.25 +1.50 025 +2.75   Left -1.00 +1.75 180 +2.75           IMAGING AND PROCEDURES  Imaging and Procedures for @TODAY @  OCT, Retina - OU - Both Eyes       Right Eye Quality was good. Central Foveal Thickness: 269. Progression has been stable. Findings include normal foveal contour, no SRF, intraretinal fluid (Trace persistent IRF/cystic changes nasal fovea, trace ERM).   Left Eye Quality was good. Central Foveal Thickness: 266. Progression has been stable. Findings include normal foveal contour, no IRF, no SRF.    Notes *Images captured and stored on drive  Diagnosis / Impression:  OD: Trace persistent IRF/cystic changes nasal fovea, trace ERM OS: NFP, no IRF/SRF   Clinical management:  See below  Abbreviations: NFP - Normal foveal profile. CME - cystoid macular edema. PED - pigment epithelial detachment. IRF - intraretinal fluid. SRF - subretinal fluid. EZ - ellipsoid zone. ERM - epiretinal membrane. ORA - outer retinal atrophy. ORT - outer retinal tubulation. SRHM - subretinal hyper-reflective material            ASSESSMENT/PLAN:    ICD-10-CM   1. Dislocation of intraocular lens, sequela  T85.22XS     2. Right retinal detachment  H33.21  3. 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 History of repair of retinal tear by laser photocoagulation  Z98.890     5. Diabetes mellitus type 2 without retinopathy ([name])  E11.9     6. Essential hypertension  I10     7. Hypertensive retinopathy of both eyes  H35.033     8. Pseudophakia of both eyes  Z96.1     9. Cystoid macular edema of right eye  H35.351 OCT, Retina - OU - Both Eyes     1. Dislocated IOL OD  - pt with history of partially dislocated 3-piece IOL -- spontaneously dislocated completely into vitreous cavity on 1.23.21  - s/p 25g PPV w/ IOL explantation and secondary sutured IOL OD -- Akreos AO60 lens, 17.5D -- 02.11.2021             - IOL in good position  - 2 superior nylon sutures removed at slit lamp 03.12.21 -- last nylon suture removed at slit lamp, 04.30.21  - corneal edema resolved; PEE improved             - s/p STK OD #1 (07.09.21), #2 (01.26.22), #3 (08.30.22) for CME  - BCVA 20/30 - stable  - OCT Interval increase in IRF/cystic changes nasal fovea, trace ERM (see #9 below)  - IOP 08  - Ats QID OU   - f/u 3-4 months -- DFE, OCT   2,3. Rhegmatogenous retinal detachment, right eye  - bullous, superior, mac off detachment, onset of foveal involvement 11.11.20 by history  - Bi-lobed superior  detachment, superior lobe spanning 1000-0130, nasal lobe 0130-0400  - lattice degeneration with microtears noted at 1030  - s/p PPV/PFC/EL/FAX/14% C3F8 OD, 11.12.20             - intraop: HST at 2 oclock was found; also detachment had progressed to subtotal detachment spanning 9 oclock to 6 oclock (going in clockwise direction); also severe zonular insufficiency with IOL very mobile throughout case -- did not dislocate completely during the case or immediately post op  - doing well  - retina attached and in good position--good laser in place  4. History of retinal defects s/p laser retinopexy OU with Dr. Alvia in 2013  - OD laser at 0900  - OS laser at 1030  - stable  5. Diabetes mellitus, type 2 without retinopathy  - A1c was 6.2 in August 2022  - The incidence, risk factors for progression, natural history and treatment options for diabetic retinopathy  were discussed with patient.    - The need for close monitoring of blood glucose, blood pressure, and serum lipids, avoiding cigarette or any type of tobacco, and the need for long term follow up was also discussed with patient.   - monitor  6,7. Hypertensive retinopathy OU  - discussed importance of tight BP control  - monitor  8. Pseudophakia OU  - s/p CE/IOL OU (Dr. Cleatus)  - 3 piece IOL OD was completely displaced into vitreous -- now with sutured Akreos IOL in good position OD -- see above  - OS 3-piece IOL in good position  - monitor  9. 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    Dispense:  18 mL    Refill:  4     Return for 3-4 months CME OD, DFE, OCT.  There are no Patient Instructions on file for this visit.  This document serves as a record of services personally performed by Redell JUDITHANN Hans, MD, PhD. It was created on their behalf by Wanda GEANNIE Keens, COT an ophthalmic technician. The creation of this record is the provider's dictation and/or activities during the visit.    Electronically signed by:  Wanda GEANNIE Keens, COT  06/09/24 8:28 PM   Redell JUDITHANN Hans, M.D., Ph.D. Diseases & Surgery of the Retina and Vitreous [facility]  I have reviewed the above documentation for accuracy and completeness, and I agree with the above. Redell JUDITHANN Hans, M.D., Ph.D. 06/09/24 8:30 PM   Abbreviations: M myopia (nearsighted); A astigmatism; H hyperopia (farsighted); P presbyopia; Mrx spectacle prescription;  CTL contact lenses; OD right eye; OS left eye; OU both eyes  XT exotropia; ET esotropia; PEK punctate epithelial keratitis; PEE punctate epithelial erosions; DES dry eye syndrome; MGD meibomian gland dysfunction; ATs artificial tears; PFAT's preservative free artificial tears; NSC nuclear sclerotic cataract; PSC posterior subcapsular cataract; ERM epi-retinal membrane; PVD posterior vitreous detachment; RD retinal detachment; DM diabetes mellitus; DR diabetic retinopathy; NPDR non-proliferative diabetic retinopathy; PDR proliferative diabetic retinopathy; CSME clinically significant macular edema; DME diabetic macular edema; dbh dot blot hemorrhages; CWS cotton wool spot; POAG primary open angle glaucoma; C/D cup-to-disc ratio; HVF humphrey visual field; GVF goldmann visual field; OCT optical coherence tomography; IOP intraocular pressure; BRVO Branch retinal vein  occlusion; CRVO central retinal vein occlusion; CRAO central retinal artery occlusion; BRAO branch retinal artery occlusion; RT retinal tear; SB scleral buckle; PPV pars plana vitrectomy; VH Vitreous hemorrhage; PRP panretinal laser photocoagulation; IVK intravitreal kenalog ; VMT vitreomacular traction; MH Macular hole;  NVD neovascularization of the disc; NVE neovascularization elsewhere; AREDS age related eye disease study; ARMD age related macular degeneration; POAG primary open angle glaucoma; EBMD epithelial/anterior basement membrane dystrophy; ACIOL anterior chamber intraocular lens; IOL intraocular lens; PCIOL posterior chamber intraocular lens; Phaco/IOL phacoemulsification with intraocular lens placement; PRK photorefractive keratectomy; LASIK laser assisted in situ keratomileusis; HTN hypertension; DM diabetes mellitus; COPD chronic obstructive pulmonary disease

## 2024-06-04 ENCOUNTER — Encounter (INDEPENDENT_AMBULATORY_CARE_PROVIDER_SITE_OTHER): Payer: Self-pay | Admitting: Ophthalmology

## 2024-06-04 ENCOUNTER — Ambulatory Visit (INDEPENDENT_AMBULATORY_CARE_PROVIDER_SITE_OTHER): Admitting: Ophthalmology

## 2024-06-04 DIAGNOSIS — H35351 Cystoid macular degeneration, right eye: Secondary | ICD-10-CM

## 2024-06-04 DIAGNOSIS — H35411 Lattice degeneration of retina, right eye: Secondary | ICD-10-CM

## 2024-06-04 DIAGNOSIS — Z961 Presence of intraocular lens: Secondary | ICD-10-CM

## 2024-06-04 DIAGNOSIS — I10 Essential (primary) hypertension: Secondary | ICD-10-CM | POA: Diagnosis not present

## 2024-06-04 DIAGNOSIS — H3321 Serous retinal detachment, right eye: Secondary | ICD-10-CM

## 2024-06-04 DIAGNOSIS — T8522XS Displacement of intraocular lens, sequela: Secondary | ICD-10-CM

## 2024-06-04 DIAGNOSIS — Z9889 Other specified postprocedural states: Secondary | ICD-10-CM | POA: Diagnosis not present

## 2024-06-04 DIAGNOSIS — E119 Type 2 diabetes mellitus without complications: Secondary | ICD-10-CM | POA: Diagnosis not present

## 2024-06-04 DIAGNOSIS — H35033 Hypertensive retinopathy, bilateral: Secondary | ICD-10-CM

## 2024-06-04 MED ORDER — BROMFENAC SODIUM 0.07 % OP SOLN: 1.0000 [drp] | Freq: Two times a day (BID) | OPHTHALMIC | 4 refills | Status: AC

## 2024-06-09 ENCOUNTER — Encounter (INDEPENDENT_AMBULATORY_CARE_PROVIDER_SITE_OTHER): Payer: Self-pay | Admitting: Ophthalmology

## 2024-10-08 ENCOUNTER — Encounter (INDEPENDENT_AMBULATORY_CARE_PROVIDER_SITE_OTHER): Admitting: Ophthalmology
